# Patient Record
Sex: Female | Born: 1955
Health system: Southern US, Community
[De-identification: ages and names within clinical notes are randomized; demographics above are authoritative.]

## PROBLEM LIST (undated history)

## (undated) DIAGNOSIS — Z8742 Personal history of other diseases of the female genital tract: Secondary | ICD-10-CM

## (undated) DIAGNOSIS — B182 Chronic viral hepatitis C: Secondary | ICD-10-CM

## (undated) DIAGNOSIS — IMO0002 Reserved for concepts with insufficient information to code with codable children: Secondary | ICD-10-CM

## (undated) DIAGNOSIS — E1142 Type 2 diabetes mellitus with diabetic polyneuropathy: Secondary | ICD-10-CM

## (undated) DIAGNOSIS — Z8619 Personal history of other infectious and parasitic diseases: Secondary | ICD-10-CM

## (undated) DIAGNOSIS — E1165 Type 2 diabetes mellitus with hyperglycemia: Secondary | ICD-10-CM

## (undated) DIAGNOSIS — K740 Hepatic fibrosis: Secondary | ICD-10-CM

## (undated) DIAGNOSIS — G5603 Carpal tunnel syndrome, bilateral upper limbs: Secondary | ICD-10-CM

## (undated) DIAGNOSIS — E785 Hyperlipidemia, unspecified: Secondary | ICD-10-CM

## (undated) DIAGNOSIS — I10 Essential (primary) hypertension: Secondary | ICD-10-CM

## (undated) DIAGNOSIS — K219 Gastro-esophageal reflux disease without esophagitis: Secondary | ICD-10-CM

## (undated) DIAGNOSIS — N189 Chronic kidney disease, unspecified: Secondary | ICD-10-CM

## (undated) HISTORY — DX: Personal history of other diseases of the female genital tract: Z87.42

## (undated) HISTORY — DX: Chronic viral hepatitis C: B18.2

## (undated) HISTORY — DX: Gastro-esophageal reflux disease without esophagitis: K21.9

## (undated) HISTORY — DX: Carpal tunnel syndrome, bilateral upper limbs: G56.03

## (undated) HISTORY — DX: Personal history of other infectious and parasitic diseases: Z86.19

## (undated) HISTORY — DX: Type 2 diabetes mellitus with diabetic polyneuropathy: E11.42

## (undated) HISTORY — PX: EYE SURGERY: SHX253

## (undated) HISTORY — DX: Hepatic fibrosis: K74.0

## (undated) HISTORY — PX: ABDOMINAL HYSTERECTOMY: SHX81

## (undated) HISTORY — DX: Hyperlipidemia, unspecified: E78.5

## (undated) HISTORY — DX: Reserved for concepts with insufficient information to code with codable children: IMO0002

## (undated) HISTORY — PX: CARPAL TUNNEL RELEASE: SHX101

## (undated) HISTORY — PX: UPPER GI ENDOSCOPY: SHX6162

## (undated) HISTORY — PX: COLONOSCOPY: SHX174

## (undated) HISTORY — DX: Type 2 diabetes mellitus with hyperglycemia: E11.65

---

## 1998-06-03 ENCOUNTER — Emergency Department (HOSPITAL_COMMUNITY): Admission: EM | Admit: 1998-06-03 | Discharge: 1998-06-03 | Payer: Self-pay | Admitting: Emergency Medicine

## 2000-11-15 ENCOUNTER — Emergency Department (HOSPITAL_COMMUNITY): Admission: EM | Admit: 2000-11-15 | Discharge: 2000-11-15 | Payer: Self-pay | Admitting: Emergency Medicine

## 2002-07-13 ENCOUNTER — Emergency Department (HOSPITAL_COMMUNITY): Admission: EM | Admit: 2002-07-13 | Discharge: 2002-07-13 | Payer: Self-pay | Admitting: Emergency Medicine

## 2002-12-04 ENCOUNTER — Emergency Department (HOSPITAL_COMMUNITY): Admission: EM | Admit: 2002-12-04 | Discharge: 2002-12-04 | Payer: Self-pay | Admitting: Emergency Medicine

## 2002-12-07 DIAGNOSIS — E11319 Type 2 diabetes mellitus with unspecified diabetic retinopathy without macular edema: Secondary | ICD-10-CM | POA: Insufficient documentation

## 2003-10-27 ENCOUNTER — Observation Stay (HOSPITAL_COMMUNITY): Admission: AD | Admit: 2003-10-27 | Discharge: 2003-10-27 | Payer: Self-pay | Admitting: Family Medicine

## 2004-02-12 ENCOUNTER — Encounter: Admission: RE | Admit: 2004-02-12 | Discharge: 2004-02-12 | Payer: Self-pay | Admitting: Family Medicine

## 2004-06-24 ENCOUNTER — Ambulatory Visit (HOSPITAL_COMMUNITY): Admission: RE | Admit: 2004-06-24 | Discharge: 2004-06-24 | Payer: Self-pay | Admitting: Family Medicine

## 2004-08-12 ENCOUNTER — Ambulatory Visit (HOSPITAL_COMMUNITY): Admission: RE | Admit: 2004-08-12 | Discharge: 2004-08-12 | Payer: Self-pay | Admitting: Internal Medicine

## 2004-09-02 ENCOUNTER — Ambulatory Visit (HOSPITAL_COMMUNITY): Admission: RE | Admit: 2004-09-02 | Discharge: 2004-09-02 | Payer: Self-pay | Admitting: Internal Medicine

## 2004-10-27 ENCOUNTER — Ambulatory Visit: Payer: Self-pay | Admitting: *Deleted

## 2004-10-28 ENCOUNTER — Ambulatory Visit: Payer: Self-pay | Admitting: Family Medicine

## 2004-11-03 ENCOUNTER — Ambulatory Visit (HOSPITAL_COMMUNITY): Admission: RE | Admit: 2004-11-03 | Discharge: 2004-11-03 | Payer: Self-pay | Admitting: *Deleted

## 2004-11-18 ENCOUNTER — Ambulatory Visit: Payer: Self-pay | Admitting: Obstetrics and Gynecology

## 2004-12-08 ENCOUNTER — Inpatient Hospital Stay (HOSPITAL_COMMUNITY): Admission: AD | Admit: 2004-12-08 | Discharge: 2004-12-08 | Payer: Self-pay | Admitting: Obstetrics & Gynecology

## 2004-12-15 ENCOUNTER — Encounter (INDEPENDENT_AMBULATORY_CARE_PROVIDER_SITE_OTHER): Payer: Self-pay | Admitting: Specialist

## 2004-12-15 ENCOUNTER — Inpatient Hospital Stay (HOSPITAL_COMMUNITY): Admission: AD | Admit: 2004-12-15 | Discharge: 2004-12-15 | Payer: Self-pay | Admitting: Obstetrics & Gynecology

## 2004-12-15 ENCOUNTER — Ambulatory Visit (HOSPITAL_COMMUNITY): Admission: RE | Admit: 2004-12-15 | Discharge: 2004-12-15 | Payer: Self-pay | Admitting: Obstetrics and Gynecology

## 2004-12-15 ENCOUNTER — Ambulatory Visit: Payer: Self-pay | Admitting: Obstetrics and Gynecology

## 2004-12-30 ENCOUNTER — Ambulatory Visit: Payer: Self-pay | Admitting: Obstetrics & Gynecology

## 2005-01-03 ENCOUNTER — Ambulatory Visit: Payer: Self-pay | Admitting: Family Medicine

## 2005-01-03 ENCOUNTER — Inpatient Hospital Stay (HOSPITAL_COMMUNITY): Admission: AD | Admit: 2005-01-03 | Discharge: 2005-01-03 | Payer: Self-pay | Admitting: Family Medicine

## 2005-01-08 ENCOUNTER — Ambulatory Visit: Payer: Self-pay | Admitting: Family Medicine

## 2005-01-27 ENCOUNTER — Ambulatory Visit: Payer: Self-pay | Admitting: Obstetrics and Gynecology

## 2005-02-03 ENCOUNTER — Ambulatory Visit: Payer: Self-pay | Admitting: Obstetrics and Gynecology

## 2005-03-03 ENCOUNTER — Ambulatory Visit: Payer: Self-pay | Admitting: Nurse Practitioner

## 2005-03-17 ENCOUNTER — Ambulatory Visit: Payer: Self-pay | Admitting: Nurse Practitioner

## 2005-05-07 ENCOUNTER — Ambulatory Visit: Payer: Self-pay | Admitting: Obstetrics & Gynecology

## 2005-07-10 ENCOUNTER — Ambulatory Visit: Payer: Self-pay | Admitting: Internal Medicine

## 2005-07-16 ENCOUNTER — Ambulatory Visit: Payer: Self-pay | Admitting: Internal Medicine

## 2005-07-31 ENCOUNTER — Ambulatory Visit: Payer: Self-pay | Admitting: Internal Medicine

## 2005-09-04 ENCOUNTER — Ambulatory Visit: Payer: Self-pay | Admitting: Internal Medicine

## 2005-09-24 ENCOUNTER — Ambulatory Visit: Payer: Self-pay | Admitting: Internal Medicine

## 2005-10-19 ENCOUNTER — Ambulatory Visit: Payer: Self-pay | Admitting: Internal Medicine

## 2005-11-02 ENCOUNTER — Ambulatory Visit: Payer: Self-pay | Admitting: Internal Medicine

## 2005-11-04 ENCOUNTER — Ambulatory Visit: Payer: Self-pay | Admitting: Internal Medicine

## 2005-11-23 ENCOUNTER — Encounter: Admission: RE | Admit: 2005-11-23 | Discharge: 2005-11-23 | Payer: Self-pay | Admitting: *Deleted

## 2005-12-10 ENCOUNTER — Ambulatory Visit: Payer: Self-pay | Admitting: Obstetrics and Gynecology

## 2005-12-23 ENCOUNTER — Ambulatory Visit: Payer: Self-pay | Admitting: Internal Medicine

## 2006-02-01 ENCOUNTER — Ambulatory Visit: Payer: Self-pay | Admitting: Obstetrics and Gynecology

## 2006-02-01 ENCOUNTER — Inpatient Hospital Stay (HOSPITAL_COMMUNITY): Admission: RE | Admit: 2006-02-01 | Discharge: 2006-02-04 | Payer: Self-pay | Admitting: Obstetrics and Gynecology

## 2006-02-02 ENCOUNTER — Encounter (INDEPENDENT_AMBULATORY_CARE_PROVIDER_SITE_OTHER): Payer: Self-pay | Admitting: *Deleted

## 2006-02-18 ENCOUNTER — Ambulatory Visit: Payer: Self-pay | Admitting: Obstetrics and Gynecology

## 2006-03-11 ENCOUNTER — Ambulatory Visit: Payer: Self-pay | Admitting: Obstetrics and Gynecology

## 2006-03-17 ENCOUNTER — Ambulatory Visit: Payer: Self-pay | Admitting: Internal Medicine

## 2006-04-12 ENCOUNTER — Ambulatory Visit: Payer: Self-pay | Admitting: Internal Medicine

## 2006-04-12 ENCOUNTER — Ambulatory Visit (HOSPITAL_COMMUNITY): Admission: RE | Admit: 2006-04-12 | Discharge: 2006-04-12 | Payer: Self-pay | Admitting: Internal Medicine

## 2006-04-25 ENCOUNTER — Emergency Department (HOSPITAL_COMMUNITY): Admission: EM | Admit: 2006-04-25 | Discharge: 2006-04-25 | Payer: Self-pay | Admitting: Emergency Medicine

## 2006-05-04 ENCOUNTER — Ambulatory Visit: Payer: Self-pay | Admitting: Internal Medicine

## 2006-06-29 ENCOUNTER — Ambulatory Visit: Payer: Self-pay | Admitting: Internal Medicine

## 2006-08-02 ENCOUNTER — Ambulatory Visit (HOSPITAL_COMMUNITY): Admission: RE | Admit: 2006-08-02 | Discharge: 2006-08-02 | Payer: Self-pay | Admitting: Hospitalist

## 2006-08-02 ENCOUNTER — Ambulatory Visit: Payer: Self-pay | Admitting: Hospitalist

## 2006-08-05 ENCOUNTER — Ambulatory Visit: Payer: Self-pay | Admitting: Hospitalist

## 2006-08-10 ENCOUNTER — Ambulatory Visit: Payer: Self-pay | Admitting: Cardiology

## 2006-08-11 ENCOUNTER — Encounter (HOSPITAL_COMMUNITY): Admission: RE | Admit: 2006-08-11 | Discharge: 2006-08-19 | Payer: Self-pay | Admitting: Internal Medicine

## 2006-08-16 ENCOUNTER — Ambulatory Visit: Payer: Self-pay | Admitting: Internal Medicine

## 2006-08-17 ENCOUNTER — Ambulatory Visit: Payer: Self-pay | Admitting: Internal Medicine

## 2006-08-24 ENCOUNTER — Ambulatory Visit (HOSPITAL_COMMUNITY): Admission: RE | Admit: 2006-08-24 | Discharge: 2006-08-24 | Payer: Self-pay | Admitting: Internal Medicine

## 2006-08-24 ENCOUNTER — Encounter: Payer: Self-pay | Admitting: Vascular Surgery

## 2006-09-27 ENCOUNTER — Ambulatory Visit: Payer: Self-pay | Admitting: Internal Medicine

## 2006-10-07 ENCOUNTER — Encounter (INDEPENDENT_AMBULATORY_CARE_PROVIDER_SITE_OTHER): Payer: Self-pay | Admitting: *Deleted

## 2006-10-07 ENCOUNTER — Ambulatory Visit: Payer: Self-pay | Admitting: Internal Medicine

## 2006-10-07 DIAGNOSIS — Z8619 Personal history of other infectious and parasitic diseases: Secondary | ICD-10-CM

## 2006-10-07 HISTORY — DX: Personal history of other infectious and parasitic diseases: Z86.19

## 2006-10-07 LAB — CONVERTED CEMR LAB: D-Dimer, Quant: <0.22 ug/mL (ref 0.00–0.48)

## 2006-11-02 ENCOUNTER — Ambulatory Visit: Payer: Self-pay | Admitting: Internal Medicine

## 2006-11-02 ENCOUNTER — Encounter (INDEPENDENT_AMBULATORY_CARE_PROVIDER_SITE_OTHER): Payer: Self-pay | Admitting: *Deleted

## 2006-11-02 LAB — CONVERTED CEMR LAB
AST: 24 units/L (ref 0–37)
Albumin: 3.4 g/dL — ABNORMAL LOW (ref 3.5–5.2)
Alkaline Phosphatase: 56 units/L (ref 39–117)
Potassium: 3.5 meq/L (ref 3.5–5.3)
Sodium: 135 meq/L (ref 135–145)
Total Protein: 7 g/dL (ref 6.0–8.3)

## 2006-11-09 ENCOUNTER — Ambulatory Visit: Payer: Self-pay | Admitting: *Deleted

## 2006-11-09 ENCOUNTER — Encounter (INDEPENDENT_AMBULATORY_CARE_PROVIDER_SITE_OTHER): Payer: Self-pay | Admitting: *Deleted

## 2006-11-09 DIAGNOSIS — I152 Hypertension secondary to endocrine disorders: Secondary | ICD-10-CM | POA: Insufficient documentation

## 2006-11-09 DIAGNOSIS — I10 Essential (primary) hypertension: Secondary | ICD-10-CM

## 2006-11-09 DIAGNOSIS — E785 Hyperlipidemia, unspecified: Secondary | ICD-10-CM

## 2006-11-09 DIAGNOSIS — E1159 Type 2 diabetes mellitus with other circulatory complications: Secondary | ICD-10-CM

## 2006-11-09 LAB — CONVERTED CEMR LAB
CO2: 28 meq/L (ref 19–32)
Calcium: 9.5 mg/dL (ref 8.4–10.5)
Creatinine, Ser: 1.09 mg/dL (ref 0.40–1.20)
Glucose, Bld: 406 mg/dL — ABNORMAL HIGH (ref 70–99)
Sodium: 136 meq/L (ref 135–145)

## 2006-11-24 ENCOUNTER — Encounter: Admission: RE | Admit: 2006-11-24 | Discharge: 2006-11-24 | Payer: Self-pay | Admitting: Internal Medicine

## 2006-12-14 ENCOUNTER — Ambulatory Visit: Payer: Self-pay | Admitting: Internal Medicine

## 2007-01-31 ENCOUNTER — Encounter (INDEPENDENT_AMBULATORY_CARE_PROVIDER_SITE_OTHER): Payer: Self-pay | Admitting: *Deleted

## 2007-01-31 ENCOUNTER — Ambulatory Visit: Payer: Self-pay | Admitting: Hospitalist

## 2007-01-31 LAB — CONVERTED CEMR LAB
BUN: 14 mg/dL (ref 6–23)
CO2: 31 meq/L (ref 19–32)
Glucose, Bld: 385 mg/dL — ABNORMAL HIGH (ref 70–99)
Potassium: 3.6 meq/L (ref 3.5–5.3)

## 2007-02-08 ENCOUNTER — Telehealth (INDEPENDENT_AMBULATORY_CARE_PROVIDER_SITE_OTHER): Payer: Self-pay | Admitting: *Deleted

## 2007-02-22 ENCOUNTER — Telehealth: Payer: Self-pay | Admitting: *Deleted

## 2007-02-24 ENCOUNTER — Ambulatory Visit: Payer: Self-pay | Admitting: Internal Medicine

## 2007-02-24 ENCOUNTER — Encounter (INDEPENDENT_AMBULATORY_CARE_PROVIDER_SITE_OTHER): Payer: Self-pay | Admitting: *Deleted

## 2007-02-24 LAB — CONVERTED CEMR LAB: Blood Glucose, Fingerstick: 89

## 2007-03-14 ENCOUNTER — Telehealth (INDEPENDENT_AMBULATORY_CARE_PROVIDER_SITE_OTHER): Payer: Self-pay | Admitting: *Deleted

## 2007-03-15 ENCOUNTER — Telehealth: Payer: Self-pay | Admitting: *Deleted

## 2007-03-15 ENCOUNTER — Ambulatory Visit: Payer: Self-pay | Admitting: Internal Medicine

## 2007-03-16 ENCOUNTER — Telehealth (INDEPENDENT_AMBULATORY_CARE_PROVIDER_SITE_OTHER): Payer: Self-pay | Admitting: *Deleted

## 2007-03-22 ENCOUNTER — Encounter (INDEPENDENT_AMBULATORY_CARE_PROVIDER_SITE_OTHER): Payer: Self-pay | Admitting: *Deleted

## 2007-03-30 ENCOUNTER — Telehealth (INDEPENDENT_AMBULATORY_CARE_PROVIDER_SITE_OTHER): Payer: Self-pay | Admitting: *Deleted

## 2007-04-25 ENCOUNTER — Encounter (INDEPENDENT_AMBULATORY_CARE_PROVIDER_SITE_OTHER): Payer: Self-pay | Admitting: *Deleted

## 2007-04-25 ENCOUNTER — Ambulatory Visit: Payer: Self-pay | Admitting: Hospitalist

## 2007-04-25 LAB — CONVERTED CEMR LAB
ALT: 29 units/L (ref 0–35)
Albumin: 4.5 g/dL (ref 3.5–5.2)
CO2: 26 meq/L (ref 19–32)
Calcium: 9.9 mg/dL (ref 8.4–10.5)
Chloride: 99 meq/L (ref 96–112)
Creatinine, Ser: 0.9 mg/dL (ref 0.40–1.20)
Microalb Creat Ratio: 6.8 mg/g (ref 0.0–30.0)
Potassium: 3.6 meq/L (ref 3.5–5.3)
Total Protein: 7.9 g/dL (ref 6.0–8.3)

## 2007-05-04 ENCOUNTER — Telehealth: Payer: Self-pay | Admitting: *Deleted

## 2007-07-18 DIAGNOSIS — T677XXA Heat edema, initial encounter: Secondary | ICD-10-CM

## 2007-07-19 ENCOUNTER — Ambulatory Visit: Payer: Self-pay | Admitting: Infectious Disease

## 2007-07-19 ENCOUNTER — Telehealth: Payer: Self-pay | Admitting: Licensed Clinical Social Worker

## 2007-07-19 LAB — CONVERTED CEMR LAB: Blood Glucose, Fingerstick: 494

## 2007-08-12 ENCOUNTER — Encounter (INDEPENDENT_AMBULATORY_CARE_PROVIDER_SITE_OTHER): Payer: Self-pay | Admitting: *Deleted

## 2007-08-15 ENCOUNTER — Telehealth: Payer: Self-pay | Admitting: *Deleted

## 2007-08-18 ENCOUNTER — Ambulatory Visit: Payer: Self-pay | Admitting: Internal Medicine

## 2007-08-18 DIAGNOSIS — M25539 Pain in unspecified wrist: Secondary | ICD-10-CM

## 2007-10-12 ENCOUNTER — Ambulatory Visit: Payer: Self-pay | Admitting: Internal Medicine

## 2007-11-15 ENCOUNTER — Ambulatory Visit: Payer: Self-pay | Admitting: Internal Medicine

## 2007-11-15 ENCOUNTER — Ambulatory Visit (HOSPITAL_COMMUNITY): Admission: RE | Admit: 2007-11-15 | Discharge: 2007-11-15 | Payer: Self-pay | Admitting: Internal Medicine

## 2007-11-15 ENCOUNTER — Encounter (INDEPENDENT_AMBULATORY_CARE_PROVIDER_SITE_OTHER): Payer: Self-pay | Admitting: Infectious Diseases

## 2007-11-15 DIAGNOSIS — M25549 Pain in joints of unspecified hand: Secondary | ICD-10-CM

## 2007-11-15 LAB — CONVERTED CEMR LAB: Blood Glucose, Fingerstick: 361

## 2007-11-21 LAB — CONVERTED CEMR LAB
HCT: 41.4 % (ref 36.0–46.0)
Hemoglobin: 14.4 g/dL (ref 12.0–15.0)
MCHC: 34.8 g/dL (ref 30.0–36.0)
MCV: 80.5 fL (ref 78.0–100.0)
Platelets: 246 10*3/uL (ref 150–400)
RDW: 13.3 % (ref 11.5–15.5)

## 2007-11-28 ENCOUNTER — Ambulatory Visit: Payer: Self-pay | Admitting: Hospitalist

## 2007-11-28 ENCOUNTER — Encounter: Admission: RE | Admit: 2007-11-28 | Discharge: 2007-11-28 | Payer: Self-pay | Admitting: *Deleted

## 2007-11-28 LAB — CONVERTED CEMR LAB
Blood Glucose, Home Monitor: 1 mg/dL
Insulin/Carbohydrate Ratio: 1

## 2007-11-29 LAB — CONVERTED CEMR LAB
Calcium: 9.8 mg/dL (ref 8.4–10.5)
Creatinine, Ser: 0.85 mg/dL (ref 0.40–1.20)
Sodium: 134 meq/L — ABNORMAL LOW (ref 135–145)

## 2007-12-05 ENCOUNTER — Telehealth: Payer: Self-pay | Admitting: *Deleted

## 2007-12-29 ENCOUNTER — Telehealth (INDEPENDENT_AMBULATORY_CARE_PROVIDER_SITE_OTHER): Payer: Self-pay | Admitting: *Deleted

## 2008-02-07 ENCOUNTER — Telehealth (INDEPENDENT_AMBULATORY_CARE_PROVIDER_SITE_OTHER): Payer: Self-pay | Admitting: Pharmacy Technician

## 2008-02-14 ENCOUNTER — Telehealth (INDEPENDENT_AMBULATORY_CARE_PROVIDER_SITE_OTHER): Payer: Self-pay | Admitting: *Deleted

## 2008-02-20 ENCOUNTER — Telehealth (INDEPENDENT_AMBULATORY_CARE_PROVIDER_SITE_OTHER): Payer: Self-pay | Admitting: *Deleted

## 2008-02-21 ENCOUNTER — Ambulatory Visit: Payer: Self-pay | Admitting: Infectious Diseases

## 2008-02-21 DIAGNOSIS — M25519 Pain in unspecified shoulder: Secondary | ICD-10-CM

## 2008-02-21 LAB — CONVERTED CEMR LAB: Hgb A1c MFr Bld: >14 %

## 2008-03-06 ENCOUNTER — Telehealth (INDEPENDENT_AMBULATORY_CARE_PROVIDER_SITE_OTHER): Payer: Self-pay | Admitting: *Deleted

## 2008-03-20 ENCOUNTER — Ambulatory Visit: Payer: Self-pay | Admitting: Infectious Disease

## 2008-03-20 ENCOUNTER — Ambulatory Visit (HOSPITAL_COMMUNITY): Admission: RE | Admit: 2008-03-20 | Discharge: 2008-03-20 | Payer: Self-pay | Admitting: Infectious Disease

## 2008-03-20 LAB — CONVERTED CEMR LAB: Blood Glucose, Fingerstick: 71

## 2008-03-22 ENCOUNTER — Telehealth: Payer: Self-pay | Admitting: *Deleted

## 2008-04-04 ENCOUNTER — Ambulatory Visit: Payer: Self-pay | Admitting: Internal Medicine

## 2008-04-05 ENCOUNTER — Encounter (INDEPENDENT_AMBULATORY_CARE_PROVIDER_SITE_OTHER): Payer: Self-pay | Admitting: *Deleted

## 2008-04-05 ENCOUNTER — Ambulatory Visit: Payer: Self-pay | Admitting: Internal Medicine

## 2008-04-06 ENCOUNTER — Telehealth (INDEPENDENT_AMBULATORY_CARE_PROVIDER_SITE_OTHER): Payer: Self-pay | Admitting: *Deleted

## 2008-04-06 LAB — CONVERTED CEMR LAB: Sed Rate: 14 mm/hr (ref 0–22)

## 2008-04-11 ENCOUNTER — Ambulatory Visit: Payer: Self-pay | Admitting: Internal Medicine

## 2008-06-01 ENCOUNTER — Telehealth (INDEPENDENT_AMBULATORY_CARE_PROVIDER_SITE_OTHER): Payer: Self-pay | Admitting: *Deleted

## 2008-06-06 ENCOUNTER — Telehealth: Payer: Self-pay | Admitting: *Deleted

## 2008-06-12 ENCOUNTER — Ambulatory Visit: Payer: Self-pay | Admitting: *Deleted

## 2008-06-13 ENCOUNTER — Telehealth: Payer: Self-pay | Admitting: *Deleted

## 2008-06-15 ENCOUNTER — Encounter: Payer: Self-pay | Admitting: Internal Medicine

## 2008-06-15 ENCOUNTER — Ambulatory Visit: Payer: Self-pay | Admitting: Infectious Diseases

## 2008-06-20 LAB — CONVERTED CEMR LAB
BUN: 18 mg/dL (ref 6–23)
Chloride: 103 meq/L (ref 96–112)
Creatinine, Ser: 0.76 mg/dL (ref 0.40–1.20)
Glucose, Bld: 162 mg/dL — ABNORMAL HIGH (ref 70–99)
Potassium: 3.9 meq/L (ref 3.5–5.3)

## 2008-06-27 ENCOUNTER — Ambulatory Visit: Payer: Self-pay | Admitting: Internal Medicine

## 2008-06-27 LAB — CONVERTED CEMR LAB
Blood Glucose, Fingerstick: 203
Hgb A1c MFr Bld: 6.3 %

## 2008-07-17 ENCOUNTER — Ambulatory Visit: Payer: Self-pay | Admitting: Internal Medicine

## 2008-07-23 ENCOUNTER — Telehealth: Payer: Self-pay | Admitting: Internal Medicine

## 2008-07-26 ENCOUNTER — Encounter: Admission: RE | Admit: 2008-07-26 | Discharge: 2008-09-20 | Payer: Self-pay | Admitting: Internal Medicine

## 2008-07-30 ENCOUNTER — Encounter: Payer: Self-pay | Admitting: Internal Medicine

## 2008-08-06 ENCOUNTER — Telehealth: Payer: Self-pay | Admitting: Internal Medicine

## 2008-08-31 ENCOUNTER — Ambulatory Visit: Payer: Self-pay | Admitting: Internal Medicine

## 2008-08-31 ENCOUNTER — Encounter: Payer: Self-pay | Admitting: Internal Medicine

## 2008-09-01 ENCOUNTER — Encounter: Payer: Self-pay | Admitting: Internal Medicine

## 2008-09-01 LAB — CONVERTED CEMR LAB
AST: 19 units/L (ref 0–37)
Albumin: 4.1 g/dL (ref 3.5–5.2)
BUN: 18 mg/dL (ref 6–23)
Calcium: 9.7 mg/dL (ref 8.4–10.5)
Chloride: 104 meq/L (ref 96–112)
Creatinine, Ser: 1.05 mg/dL (ref 0.40–1.20)
Glucose, Bld: 146 mg/dL — ABNORMAL HIGH (ref 70–99)
Potassium: 3.8 meq/L (ref 3.5–5.3)

## 2008-09-06 ENCOUNTER — Encounter: Payer: Self-pay | Admitting: Internal Medicine

## 2008-09-07 ENCOUNTER — Telehealth: Payer: Self-pay | Admitting: Internal Medicine

## 2008-09-25 ENCOUNTER — Telehealth: Payer: Self-pay | Admitting: Internal Medicine

## 2008-10-11 ENCOUNTER — Ambulatory Visit: Payer: Self-pay | Admitting: Internal Medicine

## 2008-10-11 ENCOUNTER — Encounter (INDEPENDENT_AMBULATORY_CARE_PROVIDER_SITE_OTHER): Payer: Self-pay | Admitting: Internal Medicine

## 2008-10-11 LAB — CONVERTED CEMR LAB: Blood Glucose, Fingerstick: 122

## 2008-10-15 LAB — CONVERTED CEMR LAB
ALT: 28 units/L (ref 0–35)
Albumin: 4.3 g/dL (ref 3.5–5.2)
CO2: 27 meq/L (ref 19–32)
Calcium: 10 mg/dL (ref 8.4–10.5)
Chloride: 98 meq/L (ref 96–112)
Cholesterol: 110 mg/dL (ref 0–200)
Glucose, Bld: 117 mg/dL — ABNORMAL HIGH (ref 70–99)
Platelets: 266 10*3/uL (ref 150–400)
Potassium: 3.9 meq/L (ref 3.5–5.3)
RBC: 4.8 M/uL (ref 3.87–5.11)
Sodium: 139 meq/L (ref 135–145)
Total Protein: 7.8 g/dL (ref 6.0–8.3)
Triglycerides: 103 mg/dL (ref <150)
VLDL: 21 mg/dL (ref 0–40)
WBC: 9.4 10*3/uL (ref 4.0–10.5)

## 2008-10-16 ENCOUNTER — Ambulatory Visit: Payer: Self-pay | Admitting: Internal Medicine

## 2008-10-16 LAB — CONVERTED CEMR LAB
OCCULT 1: NEGATIVE
OCCULT 2: POSITIVE
OCCULT 3: POSITIVE

## 2008-10-17 ENCOUNTER — Telehealth: Payer: Self-pay | Admitting: *Deleted

## 2008-10-17 ENCOUNTER — Encounter (INDEPENDENT_AMBULATORY_CARE_PROVIDER_SITE_OTHER): Payer: Self-pay | Admitting: Internal Medicine

## 2008-10-17 DIAGNOSIS — K921 Melena: Secondary | ICD-10-CM

## 2008-10-22 ENCOUNTER — Ambulatory Visit: Payer: Self-pay | Admitting: Internal Medicine

## 2008-10-26 ENCOUNTER — Encounter: Payer: Self-pay | Admitting: Internal Medicine

## 2008-11-12 ENCOUNTER — Ambulatory Visit: Payer: Self-pay | Admitting: Gastroenterology

## 2008-11-12 DIAGNOSIS — R195 Other fecal abnormalities: Secondary | ICD-10-CM | POA: Insufficient documentation

## 2008-11-16 ENCOUNTER — Ambulatory Visit: Payer: Self-pay | Admitting: Internal Medicine

## 2008-11-16 LAB — CONVERTED CEMR LAB: Blood Glucose, Fingerstick: 148

## 2008-11-28 ENCOUNTER — Encounter: Admission: RE | Admit: 2008-11-28 | Discharge: 2008-11-28 | Payer: Self-pay | Admitting: Internal Medicine

## 2008-12-05 ENCOUNTER — Ambulatory Visit: Payer: Self-pay | Admitting: Gastroenterology

## 2008-12-11 ENCOUNTER — Telehealth: Payer: Self-pay | Admitting: Gastroenterology

## 2008-12-14 ENCOUNTER — Telehealth (INDEPENDENT_AMBULATORY_CARE_PROVIDER_SITE_OTHER): Payer: Self-pay | Admitting: *Deleted

## 2008-12-18 ENCOUNTER — Encounter: Payer: Self-pay | Admitting: Gastroenterology

## 2008-12-18 ENCOUNTER — Ambulatory Visit: Payer: Self-pay | Admitting: Gastroenterology

## 2008-12-18 LAB — HM COLONOSCOPY

## 2008-12-19 ENCOUNTER — Telehealth: Payer: Self-pay | Admitting: Gastroenterology

## 2008-12-20 ENCOUNTER — Encounter: Payer: Self-pay | Admitting: Gastroenterology

## 2008-12-27 ENCOUNTER — Ambulatory Visit: Payer: Self-pay | Admitting: Infectious Disease

## 2008-12-28 ENCOUNTER — Telehealth: Payer: Self-pay | Admitting: Internal Medicine

## 2009-03-18 ENCOUNTER — Encounter: Payer: Self-pay | Admitting: Internal Medicine

## 2009-03-21 ENCOUNTER — Encounter: Payer: Self-pay | Admitting: Internal Medicine

## 2009-03-21 ENCOUNTER — Ambulatory Visit: Payer: Self-pay | Admitting: Internal Medicine

## 2009-03-21 LAB — CONVERTED CEMR LAB
Albumin: 4.1 g/dL (ref 3.5–5.2)
BUN: 17 mg/dL (ref 6–23)
CO2: 26 meq/L (ref 19–32)
GFR calc Af Amer: >60 mL/min (ref >60)
GFR calc non Af Amer: 51 mL/min — ABNORMAL LOW (ref >60)
Glucose, Bld: 474 mg/dL — ABNORMAL HIGH (ref 70–99)
Microalb Creat Ratio: 11 mg/g (ref 0.0–30.0)
Microalb, Ur: 0.5 mg/dL (ref 0.00–1.89)
Sodium: 132 meq/L — ABNORMAL LOW (ref 135–145)
Total Bilirubin: 0.4 mg/dL (ref 0.3–1.2)
Total CHOL/HDL Ratio: 3.3
Total Protein: 7.7 g/dL (ref 6.0–8.3)
VLDL: 32 mg/dL (ref 0–40)

## 2009-03-27 ENCOUNTER — Telehealth: Payer: Self-pay | Admitting: *Deleted

## 2009-04-19 ENCOUNTER — Encounter: Payer: Self-pay | Admitting: Internal Medicine

## 2009-04-26 ENCOUNTER — Ambulatory Visit: Payer: Self-pay | Admitting: Internal Medicine

## 2009-04-26 LAB — CONVERTED CEMR LAB: Blood Glucose, Fingerstick: 230

## 2009-05-01 ENCOUNTER — Encounter: Payer: Self-pay | Admitting: Internal Medicine

## 2009-05-01 LAB — HM DIABETES EYE EXAM

## 2009-05-15 ENCOUNTER — Encounter: Payer: Self-pay | Admitting: Internal Medicine

## 2009-05-24 ENCOUNTER — Ambulatory Visit: Payer: Self-pay | Admitting: Internal Medicine

## 2009-06-13 ENCOUNTER — Encounter: Payer: Self-pay | Admitting: Internal Medicine

## 2009-06-13 ENCOUNTER — Ambulatory Visit: Payer: Self-pay | Admitting: Infectious Diseases

## 2009-06-13 LAB — CONVERTED CEMR LAB
AST: 25 units/L (ref 0–37)
Alkaline Phosphatase: 65 units/L (ref 39–117)
BUN: 17 mg/dL (ref 6–23)
Blood Glucose, Fingerstick: 189
Creatinine, Ser: 0.88 mg/dL (ref 0.40–1.20)
Hgb A1c MFr Bld: 9.7 %
Microalb Creat Ratio: 15.8 mg/g (ref 0.0–30.0)

## 2009-07-10 ENCOUNTER — Ambulatory Visit: Payer: Self-pay | Admitting: Internal Medicine

## 2009-07-10 LAB — CONVERTED CEMR LAB
ALT: 24 units/L (ref 0–35)
AST: 20 units/L (ref 0–37)
Albumin: 4.2 g/dL (ref 3.5–5.2)
Blood Glucose, Fingerstick: 138
Calcium: 9.5 mg/dL (ref 8.4–10.5)
Chloride: 99 meq/L (ref 96–112)
Potassium: 3.8 meq/L (ref 3.5–5.3)
Sodium: 137 meq/L (ref 135–145)
Total Protein: 7.6 g/dL (ref 6.0–8.3)

## 2009-09-09 ENCOUNTER — Ambulatory Visit: Payer: Self-pay | Admitting: Internal Medicine

## 2009-09-09 ENCOUNTER — Encounter: Payer: Self-pay | Admitting: Internal Medicine

## 2009-09-09 ENCOUNTER — Encounter (INDEPENDENT_AMBULATORY_CARE_PROVIDER_SITE_OTHER): Payer: Self-pay | Admitting: Internal Medicine

## 2009-09-09 ENCOUNTER — Inpatient Hospital Stay (HOSPITAL_COMMUNITY): Admission: AD | Admit: 2009-09-09 | Discharge: 2009-09-10 | Payer: Self-pay | Admitting: Internal Medicine

## 2009-09-09 DIAGNOSIS — R079 Chest pain, unspecified: Secondary | ICD-10-CM

## 2009-09-09 DIAGNOSIS — A048 Other specified bacterial intestinal infections: Secondary | ICD-10-CM | POA: Insufficient documentation

## 2009-09-10 ENCOUNTER — Encounter (INDEPENDENT_AMBULATORY_CARE_PROVIDER_SITE_OTHER): Payer: Self-pay | Admitting: Internal Medicine

## 2009-09-30 ENCOUNTER — Ambulatory Visit: Payer: Self-pay | Admitting: Internal Medicine

## 2009-09-30 LAB — CONVERTED CEMR LAB
CO2: 27 meq/L (ref 19–32)
Calcium: 9.7 mg/dL (ref 8.4–10.5)
Creatinine, Ser: 0.85 mg/dL (ref 0.40–1.20)
Glucose, Bld: 279 mg/dL — ABNORMAL HIGH (ref 70–99)
Sodium: 137 meq/L (ref 135–145)

## 2009-11-05 ENCOUNTER — Ambulatory Visit: Payer: Self-pay | Admitting: Cardiology

## 2009-11-12 ENCOUNTER — Telehealth (INDEPENDENT_AMBULATORY_CARE_PROVIDER_SITE_OTHER): Payer: Self-pay | Admitting: *Deleted

## 2009-11-13 ENCOUNTER — Encounter (HOSPITAL_COMMUNITY): Admission: RE | Admit: 2009-11-13 | Discharge: 2009-12-04 | Payer: Self-pay | Admitting: Cardiovascular Disease

## 2009-11-13 ENCOUNTER — Ambulatory Visit: Payer: Self-pay

## 2009-11-13 ENCOUNTER — Ambulatory Visit: Payer: Self-pay | Admitting: Cardiology

## 2009-11-20 ENCOUNTER — Ambulatory Visit: Payer: Self-pay | Admitting: Cardiology

## 2009-11-22 LAB — CONVERTED CEMR LAB
AST: 29 units/L (ref 0–37)
Albumin: 3.8 g/dL (ref 3.5–5.2)
Cholesterol: 121 mg/dL (ref 0–200)
VLDL: 21.2 mg/dL (ref 0.0–40.0)

## 2009-12-10 ENCOUNTER — Encounter: Admission: RE | Admit: 2009-12-10 | Discharge: 2009-12-10 | Payer: Self-pay | Admitting: Internal Medicine

## 2009-12-30 ENCOUNTER — Ambulatory Visit: Payer: Self-pay | Admitting: Internal Medicine

## 2009-12-30 LAB — CONVERTED CEMR LAB
Blood Glucose, Fingerstick: 292
CO2: 26 meq/L (ref 19–32)
Chloride: 100 meq/L (ref 96–112)
Creatinine, Ser: 0.73 mg/dL (ref 0.40–1.20)
Sodium: 138 meq/L (ref 135–145)

## 2010-01-06 ENCOUNTER — Ambulatory Visit: Payer: Self-pay | Admitting: Cardiology

## 2010-01-07 ENCOUNTER — Ambulatory Visit: Payer: Self-pay | Admitting: Internal Medicine

## 2010-01-07 DIAGNOSIS — M79609 Pain in unspecified limb: Secondary | ICD-10-CM | POA: Insufficient documentation

## 2010-01-07 DIAGNOSIS — H5789 Other specified disorders of eye and adnexa: Secondary | ICD-10-CM | POA: Insufficient documentation

## 2010-01-07 LAB — CONVERTED CEMR LAB
CRP: 0.1 mg/dL (ref <0.6)
Cyclic Citrullin Peptide Ab: 1.4 units (ref <7)
Rhuematoid fact SerPl-aCnc: 29 intl units/mL — ABNORMAL HIGH (ref 0–20)
Sed Rate: 8 mm/hr (ref 0–22)

## 2010-04-09 ENCOUNTER — Telehealth: Payer: Self-pay | Admitting: Internal Medicine

## 2010-05-20 ENCOUNTER — Ambulatory Visit: Payer: Self-pay | Admitting: Internal Medicine

## 2010-06-11 ENCOUNTER — Ambulatory Visit: Payer: Self-pay | Admitting: Internal Medicine

## 2010-06-11 ENCOUNTER — Ambulatory Visit (HOSPITAL_COMMUNITY): Admission: RE | Admit: 2010-06-11 | Discharge: 2010-06-11 | Payer: Self-pay | Admitting: Internal Medicine

## 2010-06-11 ENCOUNTER — Encounter: Payer: Self-pay | Admitting: Internal Medicine

## 2010-06-11 LAB — CONVERTED CEMR LAB: Hgb A1c MFr Bld: 8.8 %

## 2010-06-12 ENCOUNTER — Ambulatory Visit: Payer: Self-pay | Admitting: Cardiology

## 2010-06-19 ENCOUNTER — Encounter: Payer: Self-pay | Admitting: Internal Medicine

## 2010-06-19 ENCOUNTER — Telehealth: Payer: Self-pay | Admitting: Internal Medicine

## 2010-06-19 ENCOUNTER — Ambulatory Visit: Payer: Self-pay | Admitting: Internal Medicine

## 2010-06-19 LAB — CONVERTED CEMR LAB
CO2: 27 meq/L (ref 19–32)
Creatinine, Urine: 68.8 mg/dL
Glucose, Bld: 199 mg/dL — ABNORMAL HIGH (ref 70–99)
HDL: 38 mg/dL — ABNORMAL LOW (ref >39)
Microalb Creat Ratio: 299.1 mg/g — ABNORMAL HIGH (ref 0.0–30.0)
Microalb, Ur: 20.58 mg/dL — ABNORMAL HIGH (ref 0.00–1.89)
Potassium: 3.9 meq/L (ref 3.5–5.3)
RBC: 4.77 M/uL (ref 3.87–5.11)
Sodium: 136 meq/L (ref 135–145)
Total CHOL/HDL Ratio: 2.7
VLDL: 15 mg/dL (ref 0–40)
WBC: 7.2 10*3/uL (ref 4.0–10.5)

## 2010-06-24 ENCOUNTER — Telehealth: Payer: Self-pay | Admitting: Internal Medicine

## 2010-06-26 ENCOUNTER — Ambulatory Visit: Payer: Self-pay

## 2010-06-26 ENCOUNTER — Ambulatory Visit (HOSPITAL_COMMUNITY): Admission: RE | Admit: 2010-06-26 | Discharge: 2010-06-26 | Payer: Self-pay | Admitting: Cardiology

## 2010-06-26 ENCOUNTER — Ambulatory Visit: Payer: Self-pay | Admitting: Cardiology

## 2010-06-26 ENCOUNTER — Encounter: Payer: Self-pay | Admitting: Cardiology

## 2010-08-08 ENCOUNTER — Telehealth: Payer: Self-pay | Admitting: Internal Medicine

## 2010-08-18 ENCOUNTER — Telehealth (INDEPENDENT_AMBULATORY_CARE_PROVIDER_SITE_OTHER): Payer: Self-pay | Admitting: *Deleted

## 2010-08-18 ENCOUNTER — Telehealth: Payer: Self-pay | Admitting: Internal Medicine

## 2010-08-21 ENCOUNTER — Ambulatory Visit: Payer: Self-pay | Admitting: Internal Medicine

## 2010-08-21 DIAGNOSIS — G609 Hereditary and idiopathic neuropathy, unspecified: Secondary | ICD-10-CM | POA: Insufficient documentation

## 2010-08-21 LAB — CONVERTED CEMR LAB: Blood Glucose, Fingerstick: 332

## 2010-08-21 LAB — HM DIABETES FOOT EXAM

## 2010-08-26 LAB — CONVERTED CEMR LAB
Anti Nuclear Antibody(ANA): NEGATIVE
TSH: 1.125 microintl units/mL (ref 0.350–4.5)
Vitamin B-12: 563 pg/mL (ref 211–911)

## 2010-08-27 ENCOUNTER — Encounter: Payer: Self-pay | Admitting: Internal Medicine

## 2010-08-27 ENCOUNTER — Ambulatory Visit: Payer: Self-pay | Admitting: Internal Medicine

## 2010-08-29 LAB — CONVERTED CEMR LAB

## 2010-09-04 ENCOUNTER — Ambulatory Visit: Payer: Self-pay | Admitting: Internal Medicine

## 2010-09-04 DIAGNOSIS — E1149 Type 2 diabetes mellitus with other diabetic neurological complication: Secondary | ICD-10-CM

## 2010-09-09 ENCOUNTER — Telehealth: Payer: Self-pay | Admitting: *Deleted

## 2010-09-25 ENCOUNTER — Telehealth: Payer: Self-pay | Admitting: Internal Medicine

## 2010-09-26 ENCOUNTER — Ambulatory Visit: Payer: Self-pay | Admitting: Family Medicine

## 2010-10-07 ENCOUNTER — Ambulatory Visit: Payer: Self-pay | Admitting: Internal Medicine

## 2010-10-17 ENCOUNTER — Telehealth: Payer: Self-pay | Admitting: Internal Medicine

## 2010-11-03 ENCOUNTER — Ambulatory Visit: Payer: Self-pay | Admitting: Family Medicine

## 2010-12-19 ENCOUNTER — Encounter
Admission: RE | Admit: 2010-12-19 | Discharge: 2010-12-19 | Payer: Self-pay | Source: Home / Self Care | Attending: Internal Medicine | Admitting: Internal Medicine

## 2010-12-19 LAB — HM MAMMOGRAPHY: HM Mammogram: NEGATIVE

## 2010-12-28 ENCOUNTER — Encounter: Payer: Self-pay | Admitting: Internal Medicine

## 2010-12-28 ENCOUNTER — Encounter: Payer: Self-pay | Admitting: *Deleted

## 2010-12-29 ENCOUNTER — Telehealth: Payer: Self-pay | Admitting: Internal Medicine

## 2011-01-06 NOTE — Assessment & Plan Note (Signed)
Summary: EST-CK/FU/MEDS/CFB   Vital Signs:  Patient profile:   55 year old female Height:      57 inches (144.78 cm) Weight:      175.1 pounds (79.59 kg) BMI:     38.03 Temp:     97.0 degrees F (36.11 degrees C) oral Pulse rate:   88 / minute BP sitting:   122 / 87  (left arm)  Vitals Entered By: Hilda Blades Ditzler RN (June 11, 2010 2:33 PM) CC: Lipid Management Is Patient Diabetic? Yes Did you bring your meter with you today? No Pain Assessment Patient in pain? yes     Location: legs and feet Intensity: 7 Type: numbness Onset of pain  past month Nutritional Status BMI of > 30 = obese Nutritional Status Detail appetite good CBG Result 309  Have you ever been in a relationship where you felt threatened, hurt or afraid?denies   Does patient need assistance? Functional Status Self care Ambulation Normal Comments Past month numbness in legs and feet. Appt with Dr Aundra Dubin 06/12/10 4PM per Dr Stanford Scotland.   Primary Care Provider:  Barton Dubois MD  CC:  Lipid Management.  History of Present Illness: Follow up appointment: 1. DM --unable to obtain lantus through [facility] for 3 months. Reports craving for sweets. Feels very "frustrated." 2. HTN. Denies any side-effectrs with meds. 3. HLD -no muscle aches with statin; no exercise.  Depression History:      The patient denies a depressed mood most of the day and a diminished interest in her usual daily activities.         Lipid Management History:      Positive NCEP/ATP III risk factors include diabetes, HDL cholesterol less than 40, and hypertension.  Negative NCEP/ATP III risk factors include female age less than 10 years old, no family history for ischemic heart disease, and non-tobacco-user status.        The patient expresses understanding of adjunctive measures for cholesterol lowering.  Adjunctive measures started by the patient include aerobic exercise, fiber, ASA, and weight reduction.  She expresses no side effects from her  lipid-lowering medication.  The patient denies any symptoms to suggest myopathy or liver disease.          Preventive Screening-Counseling & Management  Alcohol-Tobacco     Smoking Status: never     Passive Smoke Exposure: no  Caffeine-Diet-Exercise     Does Patient Exercise: yes     Type of exercise: walking     Exercise (avg: min/session): 7:00     Times/week: 1  Current Problems (verified): 1)  Redness or Discharge of Eye  (ICD-379.93) 2)  Pain in Soft Tissues of Limb  (ICD-729.5) 3)  Chest Pain  (ICD-786.50) 4)  Hyperlipidemia  (ICD-272.4) 5)  Hypertension  (ICD-401.9) 6)  Sx of Nausea and Vomiting  (ICD-787.01) 7)  Diabetes Mellitus, Type II  (ICD-250.00) 8)  Gerd  (ICD-787.02) 9)  Fecal Occult Blood  (ICD-792.1) 10)  Guaiac Positive Stool  (ICD-578.1) 11)  Shoulder Pain, Left  (ICD-719.41) 12)  Pain in Joint, Hand  (ICD-719.44) 13)  Wrist Pain, Left  (ICD-719.43) 14)  Heat Edema  ([id number]) 15)  Hx of Helicobacter Pylori Infection  (ICD-041.86)  Medications Prior to Update: 1)  Lisinopril 20 Mg Tabs (Lisinopril) .... Take 1 Tablet By Mouth Once A Day 2)  Hydrochlorothiazide 25 Mg Tabs (Hydrochlorothiazide) .... Take 1 Tablet By Mouth Once A Day 3)  Aspirin 81 Mg Chew (Aspirin) .... Take 1 Tablet By Mouth Once  A Day 4)  Lipitor 20 Mg Tabs (Atorvastatin Calcium) .... Take 1 Tablet By Mouth Once A Day 5)  Nexium 40 Mg  Cpdr (Esomeprazole Magnesium) .... Take 1 Capsule By Mouth Twice A Day 6)  Metformin Hcl 1000 Mg  Tabs (Metformin Hcl) .... Take 1 Pill Twice Daily With Food At Breakfast and Evening Meal 7)  Lantus 100 Unit/ml Soln (Insulin Glargine) .... Inject 30 Units Subcutaneouslyin The Morning and 30 Units At Evening. (12 Hours Apart). 8)  Lancets  Misc (Lancets) .... Use For Two Times A Day Monitoring of Blood Glucose. 9)  Mobic 15 Mg Tabs (Meloxicam) .... Take 1 Tablet By Mouth Two Times A Day  Allergies: 1)  ! Elavil  Directives (verified): 1)  Full  Code   Past History:  Past Medical History: Last updated: 01/06/2010 1. Diabetes mellitus, type II 2. Hyperlipidemia 3. Hypertension 4. h/o chest pain: negative cardiolite September 2007.  ETT-myoview 12/10 with EF 76%, no ischemia, probable breast attenuation.  Episode chest pain in 12/10 probably costochondritis.                                                                5. GERD: H/o H. pylori positive treated with Prev pac 11/07 6. H/o Endometriosis  Past Surgical History: Last updated: 11/12/2008 Hysterectomy sec to DUB     Risk Factors: Smoking Status: never (06/11/2010) Passive Smoke Exposure: no (06/11/2010)  Family History: Reviewed history from 11/05/2009 and no changes required. Mother and grandfather had diabetes.  Several family members have HTN and have had cancer of unknown type.  no known colon cancer.  She thinks her grandfather had an MI.   Social History: Reviewed history from 11/05/2009 and no changes required. She has trouble financially which leads to trying to make meds last longer by decreasing frequency of doses. Not married.  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   Review of Systems       per HPI  Physical Exam  General:  Well-developed,well-nourished,in no acute distress; alert,appropriate and cooperative throughout examination Head:  Normocephalic and atraumatic without obvious abnormalities. No apparent alopecia or balding. Eyes:  No corneal or conjunctival inflammation noted. EOMI. Perrla. Funduscopic exam benign, without hemorrhages, exudates or papilledema. Vision grossly normal. Ears:  no external deformities.   Nose:  no external deformity.   Mouth:  pharynx pink and moist, no erythema, and no exudates.   Neck:  supple, full ROM, no masses, no thyromegaly, no thyroid nodules or tenderness, and no cervical lymphadenopathy.   Lungs:  normal respiratory effort, no intercostal retractions, no accessory muscle  use, normal breath sounds, no crackles, and no wheezes.   Heart:  normal rate, regular rhythm, no murmur, no gallop, and no rub.   Abdomen:  soft, non-tender, normal bowel sounds, no guarding, no rigidity, and no rebound tenderness.   Msk:  Right middle finger with decreased range ofmotion and also mildly enlarged PIP. no joint warmth, no redness over joints, and no crepitation.   Pulses:  R radial normal and L radial normal.   Extremities:  No clubbing, cyanosis, edema, or deformity noted with normal full range of motion of all joints.   Neurologic:  No cranial nerve deficits noted. Station and gait are normal.  Plantar reflexes are down-going bilaterally. DTRs are symmetrical throughout. Sensory, motor and coordinative functions appear intact. Skin:  turgor normal, color normal, and no rashes.   Cervical Nodes:  No lymphadenopathy noted Psych:  Cognition and judgment appear intact. Alert and cooperative with normal attention span and concentration. No apparent delusions, illusions, hallucinations  Diabetes Management Exam:    Foot Exam (with socks and/or shoes not present):       Sensory-Pinprick/Light touch:          Left medial foot (L-4): normal          Left dorsal foot (L-5): normal          Left lateral foot (S-1): normal          Right medial foot (L-4): normal          Right dorsal foot (L-5): normal          Right lateral foot (S-1): normal       Sensory-Monofilament:          Left foot: normal          Right foot: normal       Inspection:          Left foot: normal          Right foot: normal       Nails:          Left foot: normal          Right foot: normal    Foot Exam by Podiatrist:       Date: 06/11/2010       Results: no diabetic findings       Done by: Stanford Scotland   Impression & Recommendations:  Problem # 1:  HYPERTENSION (ICD-401.9)  Controlled. Exercise, diet reviewed. Her updated medication list for this problem includes:    Lisinopril 20 Mg Tabs  (Lisinopril) .Marland Kitchen... Take 1 tablet by mouth once a day    Hydrochlorothiazide 25 Mg Tabs (Hydrochlorothiazide) .Marland Kitchen... Take 1 tablet by mouth once a day  Orders: T-Basic Metabolic Panel (99991111) 12 Lead EKG (12 Lead EKG)  BP today: 122/87 Prior BP: 130/89 (01/07/2010)  Labs Reviewed: K+: 3.8 (12/30/2009) Creat: : 0.73 (12/30/2009)   Chol: 121 (11/20/2009)   HDL: 33.60 (11/20/2009)   LDL: 66 (11/20/2009)   TG: 106.0 (11/20/2009)  Her updated medication list for this problem includes:    Lisinopril 20 Mg Tabs (Lisinopril) .Marland Kitchen... Take 1 tablet by mouth once a day    Hydrochlorothiazide 25 Mg Tabs (Hydrochlorothiazide) .Marland Kitchen... Take 1 tablet by mouth once a day  Problem # 2:  DIABETES MELLITUS, TYPE II (ICD-250.00)  Uncontrolled due to unavailability of Lantus through [facility]. Patient states that was not taking Lantus for 3 months. Admits that her diet is high in sweets. Referred to Ms. Rhiley for DME. Lantus restarted. Her updated medication list for this problem includes:    Lisinopril 20 Mg Tabs (Lisinopril) .Marland Kitchen... Take 1 tablet by mouth once a day    Aspirin 81 Mg Chew (Aspirin) .Marland Kitchen... Take 1 tablet by mouth once a day    Metformin Hcl 1000 Mg Tabs (Metformin hcl) .Marland Kitchen... Take 1 pill twice daily with food at breakfast and evening meal    Lantus 100 Unit/ml Soln (Insulin glargine) ..... Inject 30 units subcutaneouslyin the morning and 30 units at evening. (12 hours apart).  Orders: T- Capillary Blood Glucose ([id number]) T-Hgb A1C (in-house) [id number]) T-Urine Microalbumin w/creat. ratio [id number]) DME Referral (DME)  Her updated medication list for  this problem includes:    Lisinopril 20 Mg Tabs (Lisinopril) .Marland Kitchen... Take 1 tablet by mouth once a day    Aspirin 81 Mg Chew (Aspirin) .Marland Kitchen... Take 1 tablet by mouth once a day    Metformin Hcl 1000 Mg Tabs (Metformin hcl) .Marland Kitchen... Take 1 pill twice daily with food at breakfast and evening meal    Lantus 100 Unit/ml Soln (Insulin glargine) .....  Inject 30 units subcutaneouslyin the morning and 30 units at evening. (12 hours apart).  Labs Reviewed: Creat: 0.73 (12/30/2009)     Last Eye Exam: No diabetic retinopathy.  OU Visual acuity OD :      20/20 Visual acuity OS :     20/20 Intraocular pressure OD:   21   Intraocular pressure OS:   20    (05/01/2009) Reviewed HgBA1c results: 12.2 (12/30/2009)  12.2 (12/30/2009)  Labs Reviewed: Creat: 0.73 (12/30/2009)     Last Eye Exam: No diabetic retinopathy.  OU Visual acuity OD :      20/20 Visual acuity OS :     20/20 Intraocular pressure OD:   21   Intraocular pressure OS:   20    (05/01/2009) Reviewed HgBA1c results: 8.8 (06/11/2010)  12.2 (12/30/2009)  Problem # 3:  HYPERLIPIDEMIA (ICD-272.4)  Diet, exercise discussed. Her updated medication list for this problem includes:    Lipitor 20 Mg Tabs (Atorvastatin calcium) .Marland Kitchen... Take 1 tablet by mouth once a day  Orders: T-Lipid Profile [id number])  Labs Reviewed: SGOT: 29 (11/20/2009)   SGPT: 26 (11/20/2009)   HDL:33.60 (11/20/2009), 34 (03/21/2009)  LDL:66 (11/20/2009), 46 (03/21/2009)  Chol:121 (11/20/2009), 112 (03/21/2009)  Trig:106.0 (11/20/2009), 159 (03/21/2009)  Labs Reviewed: SGOT: 29 (11/20/2009)   SGPT: 26 (11/20/2009)  Lipid Goals: Chol Goal: 200 (06/11/2010)   HDL Goal: 40 (06/11/2010)   LDL Goal: 100 (06/11/2010)   TG Goal: 150 (06/11/2010)  10 Yr Risk Heart Disease: 20 %   HDL:33.60 (11/20/2009), 34 (03/21/2009)  LDL:66 (11/20/2009), 46 (03/21/2009)  Chol:121 (11/20/2009), 112 (03/21/2009)  Trig:106.0 (11/20/2009), 159 (03/21/2009)  Complete Medication List: 1)  Lisinopril 20 Mg Tabs (Lisinopril) .... Take 1 tablet by mouth once a day 2)  Hydrochlorothiazide 25 Mg Tabs (Hydrochlorothiazide) .... Take 1 tablet by mouth once a day 3)  Aspirin 81 Mg Chew (Aspirin) .... Take 1 tablet by mouth once a day 4)  Lipitor 20 Mg Tabs (Atorvastatin calcium) .... Take 1 tablet by mouth once a day 5)  Nexium 40 Mg  Cpdr (Esomeprazole magnesium) .... Take 1 capsule by mouth twice a day 6)  Metformin Hcl 1000 Mg Tabs (Metformin hcl) .... Take 1 pill twice daily with food at breakfast and evening meal 7)  Lantus 100 Unit/ml Soln (Insulin glargine) .... Inject 30 units subcutaneouslyin the morning and 30 units at evening. (12 hours apart). 8)  Lancets Misc (Lancets) .... Use for two times a day monitoring of blood glucose.  Other Orders: T-PAP ([place]) [phone]) T-CBC No Diff [id number])  Lipid Assessment/Plan:      Based on NCEP/ATP III, the patient's risk factor category is "history of diabetes".  The patient's lipid goals are as follows: Total cholesterol goal is 200; LDL cholesterol goal is 100; HDL cholesterol goal is 40; Triglyceride goal is 150.    Patient Instructions: 1)  Please, take your medications as prescribed. 2)  Call if you have any problems with obtaining your insulin in the future!!!! 3)  Please follow up with all referrals --our clinic will contact you with the  information. 4)  Please, make an appointment with Kennieth Rad. 5)  Follow up with Dr. Dyann Kief in 8 weeks (fasting). Process Orders Check Orders Results:     [facility]: Order checked:     Milana Obey MD NOT AUTHORIZED TO ORDER Tests Sent for requisitioning (June 14, 2010 11:01 AM):     06/11/2010: [facility] -- T-Urine Microalbumin w/creat. ratio [[id number]] (signed)     06/11/2010: [facility] -- T-Lipid Profile [id number] (signed)     06/11/2010: [facility] -- T-Basic Metabolic Panel 0000000 (signed)     06/11/2010: [facility] -- T-CBC No Diff [id number] (signed)    Prevention & Chronic Care Immunizations   Influenza vaccine: Fluvax 3+  (09/09/2009)   Influenza vaccine deferral: Deferred  (05/24/2009)   Influenza vaccine due: 08/07/2009    Tetanus booster: Not documented   Td booster deferral: Deferred   (05/24/2009)    Pneumococcal vaccine: Not documented   Pneumococcal vaccine deferral: Deferred  (05/24/2009)  Colorectal Screening   Hemoccult: Not documented   Hemoccult action/deferral: Ordered  (06/11/2010)   Hemoccult due: 05/24/2010    Colonoscopy: Location:  [place].    (12/18/2008)   Colonoscopy action/deferral: Deferred  (05/24/2009)   Colonoscopy due: 12/18/2018  Other Screening   Pap smear: Not documented   Pap smear action/deferral: Ordered  (06/11/2010)   Pap smear due: 06/11/2013    Mammogram: IMPRESSION:     No specific mammographic evidence of malignancy.                    ASSESSMENT: Negative - BI-RADS 1            (12/10/2009)   Mammogram action/deferral: Next screening  mammogram is recommended in one year.  (12/10/2009)   Mammogram due: 12/2010   Smoking status: never  (06/11/2010)  Diabetes Mellitus   HgbA1C: 8.8  (06/11/2010)   Hemoglobin A1C due: 12/10/2009    Eye exam: No diabetic retinopathy.  OU Visual acuity OD :      20/20 Visual acuity OS :     20/20 Intraocular pressure OD:   21   Intraocular pressure OS:   20     (05/01/2009)   Diabetic eye exam action/deferral: Ophthalmology referral  (06/11/2010)   Eye exam due: 05/2010    Foot exam: yes  (06/11/2010)   Foot exam action/deferral: Do today   High risk foot: No  (12/30/2009)   Foot care education: Done  (12/30/2009)   Foot exam due: 06/12/2011    Urine microalbumin/creatinine ratio: 15.8  (06/13/2009)   Urine microalbumin action/deferral: Ordered   Urine microalbumin/cr due: 06/12/2011    Diabetes flowsheet reviewed?: Yes   Progress toward A1C goal: Unchanged    Stage of readiness to change (diabetes management): Preparation  Lipids   Total Cholesterol: 121  (11/20/2009)   Lipid panel action/deferral: Lipid Panel ordered   LDL: 66  (11/20/2009)   LDL Direct: Not documented   HDL: 33.60  (11/20/2009)   Triglycerides: 106.0  (11/20/2009)   Lipid panel  due: 12/12/2010    SGOT (AST): 29  (11/20/2009)   SGPT (ALT): 26  (11/20/2009)   Alkaline phosphatase: 70  (11/20/2009)   Total bilirubin: 0.9  (11/20/2009)   Liver panel due: 06/12/2011    Lipid flowsheet reviewed?: Yes   Progress toward LDL goal: At goal  Hypertension   Last Blood Pressure: 122 / 87  (06/11/2010)   Serum creatinine: 0.73  (12/30/2009)  BMP action: Ordered   Serum potassium 3.8  (0000000)   Basic metabolic panel due: 99991111    Hypertension flowsheet reviewed?: Yes   Progress toward BP goal: At goal    Stage of readiness to change (hypertension management): Maintenance  Self-Management Support :   Personal Goals (by the next clinic visit) :     Personal A1C goal: 7  (09/30/2009)     Personal blood pressure goal: 130/80  (06/13/2009)     Personal LDL goal: 70  (06/13/2009)    Patient will work on the following items until the next clinic visit to reach self-care goals:     Medications and monitoring: take my medicines every day, check my blood sugar, bring all of my medications to every visit, examine my feet every day  (06/11/2010)     Eating: drink diet soda or water instead of juice or soda, eat more vegetables, use fresh or frozen vegetables, eat fruit for snacks and desserts  (06/11/2010)     Activity: take a 30 minute walk every day, park at the far end of the parking lot  (06/11/2010)     Other: bring meter to every visit  (12/30/2009)     Home glucose monitoring frequency: 2 times a day  (06/13/2009)    Diabetes self-management support: Written self-care plan, Education handout, Resources for patients handout  (06/11/2010)   Diabetes care plan printed   Diabetes education handout printed   Last diabetes self-management training by diabetes educator: 11/28/2007   Last medical nutrition therapy: 10/22/2008    Hypertension self-management support: Written self-care plan, Education handout, Resources for patients handout  (06/11/2010)    Hypertension self-care plan printed.   Hypertension education handout printed    Lipid self-management support: Written self-care plan, Education handout, Resources for patients handout  (06/11/2010)   Lipid self-care plan printed.   Lipid education handout printed      Resource handout printed.   Nursing Instructions: Give tetanus booster today Provide Hemoccult cards with instructions (see order) Pap smear today Refer for screening diabetic eye exam (see order)   Laboratory Results   Blood Tests   Date/Time Received: June 11, 2010 3:01 PM  Date/Time Reported: Lenoria Farrier  June 11, 2010 3:01 PM   HGBA1C: 8.8%   (Normal Range: Non-Diabetic - 3-6%   Control Diabetic - 6-8%) CBG Random:: 309mg /dL

## 2011-01-06 NOTE — Progress Notes (Signed)
Summary: diabetes prescriptions/dmr  Phone Note Outgoing Call   Call placed by: Barnabas Harries RD,CDE,  June 19, 2010 4:35 PM Summary of Call: needs new prescriptions for Novolog and Lantus and testing supplies sent to [facility]. Quantities are for 3 months and needs to be sent by nurse to [facility] by fax I believe. thanks.    Prescriptions: LANCETS  MISC (LANCETS) use to check blood sugar 4x daily  #200 x 3   Entered by:   Barnabas Harries RD,CDE   Authorized by:   Milta Deiters MD   Signed by:   Milta Deiters MD on 06/19/2010   Method used:   Print then Give to Patient   RxID:   [id number] TEST  STRP (GLUCOSE BLOOD) use to test blood sugar 4x a day before meals and bedtime  #450 x 3   Entered by:   Barnabas Harries RD,CDE   Authorized by:   Milta Deiters MD   Signed by:   Milta Deiters MD on 06/19/2010   Method used:   Print then Give to Patient   RxID:   [id number] RELION MINI PEN NEEDLES 31G X 6 MM MISC (INSULIN PEN NEEDLE) use to take insulin injections three times a day before meals  #3 boxes x 3   Entered by:   Barnabas Harries RD,CDE   Authorized by:   Milta Deiters MD   Signed by:   Milta Deiters MD on 06/19/2010   Method used:   Print then Give to Patient   RxID:   [id number] NOVOLOG FLEXPEN 100 UNIT/ML SOLN (INSULIN ASPART) inject 6 units 10 minutes before each meal three times a day, if you skip a meal, skip the injection  #2 boxes x 3   Entered by:   Barnabas Harries RD,CDE   Authorized by:   Milta Deiters MD   Signed by:   Milta Deiters MD on 06/19/2010   Method used:   Print then Give to Patient   RxID:   [id number] LANCETS  MISC (LANCETS) Use for two times a day monitoring of blood glucose.  #200 x 3   Entered by:   Barnabas Harries RD,CDE   Authorized by:   Milta Deiters MD   Signed by:   Milta Deiters MD on 06/19/2010   Method used:   Print then Give to Patient   RxID:   [id number] LANTUS 100 UNIT/ML SOLN (INSULIN  GLARGINE) inject 25 units subcutaneouslyin the morning when you wake up  #3 vials x 3   Entered by:   Barnabas Harries RD,CDE   Authorized by:   Milta Deiters MD   Signed by:   Milta Deiters MD on 06/19/2010   Method used:   Print then Give to Patient   RxID:   [id number]   Appended Document: diabetes prescriptions/dmr Above Rx's faxed to [facility].

## 2011-01-06 NOTE — Progress Notes (Signed)
Summary: med refill/gp  Phone Note Refill Request Message from:  Patient on Apr 09, 2010 8:57 AM  Refills Requested: Medication #1:  LANTUS 100 UNIT/ML SOLN inject 30 units subcutaneouslyin the morning and 30 units at evening. (12 hours apart).  Method Requested: Telephone to Pharmacy Initial call taken by: Morrison Old RN,  Apr 09, 2010 8:57 AM  Follow-up for Phone Call        Refill approved-nurse to complete    Prescriptions: LANTUS 100 UNIT/ML SOLN (INSULIN GLARGINE) inject 30 units subcutaneouslyin the morning and 30 units at evening. (12 hours apart).  #3 vials x 5   Entered and Authorized by:   Barton Dubois MD   Signed by:   Barton Dubois MD on 04/09/2010   Method used:   Telephoned to ...       [facility] Department (retail)       [address] [place], [place]  [address]       Ph: [phone]       Fax: [phone]   RxID:   [id number]   Appended Document: med refill/gp Lantus Rx called to [place].

## 2011-01-06 NOTE — Assessment & Plan Note (Signed)
Summary: F1Y/ABN EKG/JML   Primary Provider:  Dr. Stanford Scotland  CC:  pt went to [facility] outpatient clinic.  Pt was having trouble with her blood sugars.  .  History of Present Illness: 55 yo with history of diabetes, HTN, hyperlipidemia returns for evaluation of chest pain. She has a long history of episodes of atypical chest pain, often reproducible by palpation of her chest wall and possibly due to costochondritis.  She had a negative myoview in December.  She was sent back for evaluation for recurrent chest pain.  It is similar to her pain in December leading to the stress test.  She again has chest wall pain that is reproducible with palpation of her mid sternum.  It is not related to exertion and is mild but constant.  She has been under a lot of emotional stress recently, similar to last December.  She was also reported to have an abnormal ECG but my review of the ECG today shows and rSR' pattern in V1 and V2 which is nonspecific.   Labs (4/10): TG 154, HDL 34, LDL 46 Labs (10/10): creatinine 0.85 Labs (12/10): LDL 66, HDL 34  ECG: NSR, rSR' pattern in V1 and V2  Current Medications (verified): 1)  Lisinopril 20 Mg Tabs (Lisinopril) .... Take 1 Tablet By Mouth Once A Day 2)  Hydrochlorothiazide 25 Mg Tabs (Hydrochlorothiazide) .... Take 1 Tablet By Mouth Once A Day 3)  Aspirin 81 Mg Chew (Aspirin) .... Take 1 Tablet By Mouth Once A Day 4)  Lipitor 20 Mg Tabs (Atorvastatin Calcium) .... Take 1 Tablet By Mouth Once A Day 5)  Nexium 40 Mg  Cpdr (Esomeprazole Magnesium) .... Take 1 Capsule By Mouth Twice A Day 6)  Metformin Hcl 1000 Mg  Tabs (Metformin Hcl) .... Take 1 Pill Twice Daily With Food At Breakfast and Evening Meal 7)  Lantus 100 Unit/ml Soln (Insulin Glargine) .... Inject 30 Units Subcutaneouslyin The Morning and 30 Units At Evening. (12 Hours Apart). 8)  Lancets  Misc (Lancets) .... Use For Two Times A Day Monitoring of Blood Glucose.  Allergies (verified): 1)  !  Elavil  Past History:  Past Medical History: Reviewed history from 01/06/2010 and no changes required. 1. Diabetes mellitus, type II 2. Hyperlipidemia 3. Hypertension 4. h/o chest pain: negative cardiolite September 2007.  ETT-myoview 12/10 with EF 76%, no ischemia, probable breast attenuation.  Episode chest pain in 12/10 probably costochondritis.                                                                5. GERD: H/o H. pylori positive treated with Prev pac 11/07 6. H/o Endometriosis  Family History: Reviewed history from 11/05/2009 and no changes required. Mother and grandfather had diabetes.  Several family members have HTN and have had cancer of unknown type.  no known colon cancer.  She thinks her grandfather had an MI.   Social History: Reviewed history from 11/05/2009 and no changes required. She has trouble financially which leads to trying to make meds last longer by decreasing frequency of doses. Not married.  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   Review of Systems       All systems reviewed and negative except as per HPI.  Vital Signs:  Patient profile:   55 year old female Height:      57 inches Weight:      173 pounds BMI:     37.57 Pulse rate:   72 / minute Pulse rhythm:   regular BP sitting:   116 / 84  (left arm) Cuff size:   large  Vitals Entered By: Doug Sou CMA (June 12, 2010 4:17 PM)  Physical Exam  General:  Well developed, well nourished, in no acute distress. Neck:  Neck supple, no JVD. No masses, thyromegaly or abnormal cervical nodes. Lungs:  Clear bilaterally to auscultation and percussion. Heart:  Non-displaced PMI, chest wall with central substernal tenderness to palpation; regular rate and rhythm, S1, S2 without murmurs, rubs or gallops. Carotid upstroke normal, no bruit. Pedals normal pulses. No edema, no varicosities. Abdomen:  Bowel sounds positive; abdomen soft and non-tender without masses,  organomegaly, or hernias noted. No hepatosplenomegaly. Extremities:  No clubbing or cyanosis. Neurologic:  Alert and oriented x 3. Psych:  Normal affect.   Impression & Recommendations:  Problem # 1:  CHEST PAIN (ICD-786.50) Patient has chest pain that is highly reproducible with sternal palpation.  Pain is not exertional.  It is similar to symptoms in December at which time she had a negative myoview.  She could have recurrent costochondritis.  Her ECG has no significant abnormalities.  I will get an echo to make sure that there are no significant cardiac abnormalities.    Other Orders: Echocardiogram (Echo)  Patient Instructions: 1)  Your physician has requested that you have an echocardiogram.  Echocardiography is a painless test that uses sound waves to create images of your heart. It provides your doctor with information about the size and shape of your heart and how well your heart's chambers and valves are working.  This procedure takes approximately one hour. There are no restrictions for this procedure. 2)  Your physician wants you to follow-up in: 1 year with Dr Aundra Dubin.   You will receive a reminder letter in the mail two months in advance. If you don't receive a letter, please call our office to schedule the follow-up appointment.

## 2011-01-06 NOTE — Assessment & Plan Note (Signed)
Summary: EST-CK/FU/MEDS/CFB   Vital Signs:  Patient profile:   55 year old female Height:      57 inches (144.78 cm) Weight:      176.3 pounds (80.14 kg) BMI:     38.29 Temp:     97.6 degrees F (36.44 degrees C) oral Pulse rate:   74 / minute BP sitting:   130 / 89  (right arm)  Vitals Entered By: Hilda Blades Ditzler RN (January 07, 2010 2:21 PM) Is Patient Diabetic? Yes Did you bring your meter with you today? No Pain Assessment Patient in pain? yes     Location: h/a Intensity: 10+ Type: hurts Onset of pain  past 3-4 days Nutritional Status BMI of 25 - 29 = overweight Nutritional Status Detail appetite good CBG Result 65  Have you ever been in a relationship where you felt threatened, hurt or afraid?denies   Does patient need assistance? Functional Status Self care Ambulation Normal Comments Ck right eye - red past week and ck right hand mid finger.   Primary Care Provider:  Barton Dubois MD   History of Present Illness: 55 y/o female with pmh as described on the EMR. Who come to the clinic complaining of right middle finger pain in her PIP, inttermitent HA's and also right red eye.  Pt reports that her eye started itching and draining for the last 3-4 days; her HA is normally at night and she said that is worse if she try to fixed her vision (patient use corrective glasses but has not been waringthem lately).   She denies any CP, abd. pain, nausea, vomiting, severe reflux or any other complaints. She endorses been hungrier now that her insulin was adjusted.  BP 130/89 her CBG was 65 today (she missed breakfast and just ate a salad prior coming here). Pt denies any hypoglycemia event while checking her CBG's at home. She received orange juice and crackers at the office.  Depression History:      The patient denies a depressed mood most of the day and a diminished interest in her usual daily activities.        The patient denies that she feels like life is not worth  living, denies that she wishes that she were dead, and denies that she has thought about ending her life.         Preventive Screening-Counseling & Management  Alcohol-Tobacco     Smoking Status: never     Passive Smoke Exposure: no  Caffeine-Diet-Exercise     Does Patient Exercise: yes     Type of exercise: walking     Exercise (avg: min/session): 7:00     Times/week: 1  -  Date:  12/30/2009    HgBA1c 12.2  Problems Prior to Update: 1)  Chest Pain  (ICD-786.50) 2)  Hyperlipidemia  (ICD-272.4) 3)  Hypertension  (ICD-401.9) 4)  Sx of Nausea and Vomiting  (ICD-787.01) 5)  Diabetes Mellitus, Type II  (ICD-250.00) 6)  Gerd  (ICD-787.02) 7)  Fecal Occult Blood  (ICD-792.1) 8)  Guaiac Positive Stool  (ICD-578.1) 9)  Shoulder Pain, Left  (ICD-719.41) 10)  Pain in Joint, Hand  (ICD-719.44) 11)  Wrist Pain, Left  (ICD-719.43) 12)  Heat Edema  ([id number]) 13)  Hx of Helicobacter Pylori Infection  (ICD-041.86)  Current Problems (verified): 1)  Chest Pain  (ICD-786.50) 2)  Hyperlipidemia  (ICD-272.4) 3)  Hypertension  (ICD-401.9) 4)  Sx of Nausea and Vomiting  (ICD-787.01) 5)  Diabetes Mellitus, Type II  (ICD-250.00)  6)  Gerd  (ICD-787.02) 7)  Fecal Occult Blood  (ICD-792.1) 8)  Guaiac Positive Stool  (ICD-578.1) 9)  Shoulder Pain, Left  (ICD-719.41) 10)  Pain in Joint, Hand  (ICD-719.44) 11)  Wrist Pain, Left  (ICD-719.43) 12)  Heat Edema  (AB-123456789) 13)  Hx of Helicobacter Pylori Infection  (ICD-041.86)  Medications Prior to Update: 1)  Lisinopril 20 Mg Tabs (Lisinopril) .... Take 1 Tablet By Mouth Once A Day 2)  Hydrochlorothiazide 25 Mg Tabs (Hydrochlorothiazide) .... Take 1 Tablet By Mouth Once A Day 3)  Aspirin 81 Mg Chew (Aspirin) .... Take 1 Tablet By Mouth Once A Day 4)  Lipitor 20 Mg Tabs (Atorvastatin Calcium) .... Take 1 Tablet By Mouth Once A Day 5)  Nexium 40 Mg  Cpdr (Esomeprazole Magnesium) .... Take 1 Capsule By Mouth Twice A Day 6)  Metformin Hcl 1000 Mg   Tabs (Metformin Hcl) .... Take 1 Pill Twice Daily With Food At Breakfast and Evening Meal 7)  Lantus 100 Unit/ml Soln (Insulin Glargine) .... Inject 50 Units Subcutaneously At Bedtime Daily 8)  Lancets  Misc (Lancets) .... Use For Two Times A Day Monitoring of Blood Glucose. 9)  Ibuprofen 600 Mg Tabs (Ibuprofen) .... Take 1 Tab Every 6 Hrs As Needed For Chest Pain For Up To 2 Weeks.  Current Medications (verified): 1)  Lisinopril 20 Mg Tabs (Lisinopril) .... Take 1 Tablet By Mouth Once A Day 2)  Hydrochlorothiazide 25 Mg Tabs (Hydrochlorothiazide) .... Take 1 Tablet By Mouth Once A Day 3)  Aspirin 81 Mg Chew (Aspirin) .... Take 1 Tablet By Mouth Once A Day 4)  Lipitor 20 Mg Tabs (Atorvastatin Calcium) .... Take 1 Tablet By Mouth Once A Day 5)  Nexium 40 Mg  Cpdr (Esomeprazole Magnesium) .... Take 1 Capsule By Mouth Twice A Day 6)  Metformin Hcl 1000 Mg  Tabs (Metformin Hcl) .... Take 1 Pill Twice Daily With Food At Breakfast and Evening Meal 7)  Lantus 100 Unit/ml Soln (Insulin Glargine) .... Inject 50 Units Subcutaneously At Bedtime Daily 8)  Lancets  Misc (Lancets) .... Use For Two Times A Day Monitoring of Blood Glucose. 9)  Ibuprofen 600 Mg Tabs (Ibuprofen) .... Take 1 Tab Every 6 Hrs As Needed For Chest Pain For Up To 2 Weeks.  Allergies: 1)  ! Elavil  Past History:  Past Medical History: Last updated: 01/06/2010 1. Diabetes mellitus, type II 2. Hyperlipidemia 3. Hypertension 4. h/o chest pain: negative cardiolite September 2007.  ETT-myoview 12/10 with EF 76%, no ischemia, probable breast attenuation.  Episode chest pain in 12/10 probably costochondritis.                                                                5. GERD: H/o H. pylori positive treated with Prev pac 11/07 6. H/o Endometriosis  Past Surgical History: Last updated: 11/12/2008 Hysterectomy sec to DUB     Family History: Last updated: 11/05/2009 Mother and grandfather had diabetes.  Several family members  have HTN and have had cancer of unknown type.  no known colon cancer.  She thinks her grandfather had an MI.   Social History: Last updated: 11/05/2009 She has trouble financially which leads to trying to make meds last longer by decreasing frequency of doses. Not married.  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   Risk Factors: Exercise: yes (01/07/2010)  Risk Factors: Smoking Status: never (01/07/2010) Passive Smoke Exposure: no (01/07/2010)  Review of Systems       As per HPI.  Physical Exam  General:  alert, well-developed, well-nourished, and well-hydrated.   Eyes:  mild right conjunctival injection and increased tearing. Rest of the exam WNL. Lungs:  normal respiratory effort, no intercostal retractions, no accessory muscle use, normal breath sounds, no crackles, and no wheezes.   Heart:  normal rate, regular rhythm, no murmur, no gallop, and no rub.   Abdomen:  soft, non-tender, normal bowel sounds, no guarding, no rigidity, and no rebound tenderness.   Msk:  Right middle finger with decreased range ofmotion and also mildly enlarged PIP. no joint warmth, no redness over joints, and no crepitation.   Neurologic:  alert & oriented X3 and cranial nerves II-XII grossly intact; gait normal and DTRs symmetrical and normal.     Impression & Recommendations:  Problem # 1:  PAIN IN SOFT TISSUES OF LIMB (ICD-729.5) Exam of her middle finger is consistentlywith triggered finger, tendinitis or sinovitis. We have checked a ESR,RF,CRP and CCP, which were WNL. Will provide conservative therapy with mobic two times a day, if pain continues will refer to sports medicne for injection.   Orders: T- * Misc. Laboratory test [id number]) T-C-Reactive Protein [id number] T-Sed Rate (Automated) [id number]) T-Rheumatoid Factor [id number])  Problem # 2:  HYPERTENSION (ICD-401.9) Well controlled and at goal. No medication changes are needed. Patient advised to follow  a low sodium diet and to lose weight.  Her updated medication list for this problem includes:    Lisinopril 20 Mg Tabs (Lisinopril) .Marland Kitchen... Take 1 tablet by mouth once a day    Hydrochlorothiazide 25 Mg Tabs (Hydrochlorothiazide) .Marland Kitchen... Take 1 tablet by mouth once a day  BP today: 130/89 Prior BP: 136/81 (01/06/2010)  Labs Reviewed: K+: 3.8 (09/30/2009) Creat: : 0.85 (09/30/2009)   Chol: 121 (11/20/2009)   HDL: 33.60 (11/20/2009)   LDL: 66 (11/20/2009)   TG: 106.0 (11/20/2009)  Problem # 3:  DIABETES MELLITUS, TYPE II (ICD-250.00) A1C was deteriorated on last visit and her insulin was adjusted. Patient reportsto be hungrirnow, Recommendations for multiple small mealsa day and lowcarb diet was given; patient understand and will followregimen.In 2 months will repeatlabs and continue adjusting her medications as needed. Patient will continue using metformin two times a day.  Her updated medication list for this problem includes:    Lisinopril 20 Mg Tabs (Lisinopril) .Marland Kitchen... Take 1 tablet by mouth once a day    Aspirin 81 Mg Chew (Aspirin) .Marland Kitchen... Take 1 tablet by mouth once a day    Metformin Hcl 1000 Mg Tabs (Metformin hcl) .Marland Kitchen... Take 1 pill twice daily with food at breakfast and evening meal    Lantus 100 Unit/ml Soln (Insulin glargine) ..... Inject 30 units subcutaneouslyin the morning and 30 units at evening. (12 hours apart).  Orders: Capillary Blood Glucose/CBG [id number])  Problem # 4:  REDNESS OR DISCHARGE OF EYE (ICD-379.93) Eye redness, no discharge. Patient reprots itching and also dryness initially. Will recommend to use visine two times a day, to avoid rubbing her eyes and to folow with her eye doctor, she is due anyway for her annualvisit.   Complete Medication List: 1)  Lisinopril 20 Mg Tabs (Lisinopril) .... Take 1 tablet by mouth once a day 2)  Hydrochlorothiazide 25 Mg Tabs (Hydrochlorothiazide) .... Take 1 tablet by  mouth once a day 3)  Aspirin 81 Mg Chew (Aspirin) .... Take 1  tablet by mouth once a day 4)  Lipitor 20 Mg Tabs (Atorvastatin calcium) .... Take 1 tablet by mouth once a day 5)  Nexium 40 Mg Cpdr (Esomeprazole magnesium) .... Take 1 capsule by mouth twice a day 6)  Metformin Hcl 1000 Mg Tabs (Metformin hcl) .... Take 1 pill twice daily with food at breakfast and evening meal 7)  Lantus 100 Unit/ml Soln (Insulin glargine) .... Inject 30 units subcutaneouslyin the morning and 30 units at evening. (12 hours apart). 8)  Lancets Misc (Lancets) .... Use for two times a day monitoring of blood glucose. 9)  Mobic 15 Mg Tabs (Meloxicam) .... Take 1 tablet by mouth two times a day  Patient Instructions: 1)  Keep your appointment with me as previously scheduled. 2)  Take your medications as prescribed. 3)  Used visine twice a day in your right eye and avoid rubbing it. 4)  Do not skip meals and check your blood sugar twice a day. 5)  Please make sure you follow with your eye doctorin order to resolved the issues with your glasses. 6)  warm compresses and hand exercises will help with your finger pain; remember to take your pain medication two times a day for 7 days and then as needed for pain. Prescriptions: MOBIC 15 MG TABS (MELOXICAM) Take 1 tablet by mouth two times a day  #35 x 1   Entered and Authorized by:   Barton Dubois MD   Signed by:   Barton Dubois MD on 01/07/2010   Method used:   Print then Give to Patient   RxID:   [id number]  Process Orders Check Orders Results:     [facility]: [id number] not required for this insurance Tests Sent for requisitioning (January 22, 2010 8:34 AM):     01/07/2010: [facility] -- [place]. Laboratory test [id number] (signed)     01/07/2010: [facility] -- T-C-Reactive Protein [id number] (signed)     01/07/2010: [facility] -- T-Sed Rate (Automated) [id number] (signed)     01/07/2010: [facility] -- T-Rheumatoid Factor [id number]  (signed)    Prevention & Chronic Care Immunizations   Influenza vaccine: Fluvax 3+  (09/09/2009)   Influenza vaccine deferral: Deferred  (05/24/2009)   Influenza vaccine due: 08/07/2009    Tetanus booster: Not documented   Td booster deferral: Deferred  (05/24/2009)    Pneumococcal vaccine: Not documented   Pneumococcal vaccine deferral: Deferred  (05/24/2009)  Colorectal Screening   Hemoccult: Not documented   Hemoccult action/deferral: Deferred  (05/24/2009)   Hemoccult due: 05/24/2010    Colonoscopy: Location:  [facility].    (12/18/2008)   Colonoscopy action/deferral: Deferred  (05/24/2009)   Colonoscopy due: 12/18/2018  Other Screening   Pap smear: Not documented   Pap smear action/deferral: Deferred  (01/07/2010)   Pap smear due: 10/01/2009    Mammogram: IMPRESSION:     No specific mammographic evidence of malignancy.                    ASSESSMENT: Negative - BI-RADS 1            (12/10/2009)   Mammogram action/deferral: Next screening  mammogram is recommended in one year.  (12/10/2009)   Mammogram due: 12/2010   Smoking status: never  (01/07/2010)  Diabetes Mellitus   HgbA1C: 12.2  (12/30/2009)   Hemoglobin A1C due: 12/10/2009  Eye exam: No diabetic retinopathy.  OU Visual acuity OD :      20/20 Visual acuity OS :     20/20 Intraocular pressure OD:   21   Intraocular pressure OS:   20     (05/01/2009)   Eye exam due: 05/2010    Foot exam: yes  (10/11/2008)   High risk foot: No  (10/11/2008)   Foot care education: Done  (10/11/2008)   Foot exam due: 10/11/2009    Urine microalbumin/creatinine ratio: 15.8  (06/13/2009)   Urine microalbumin action/deferral: Ordered   Urine microalbumin/cr due: 06/13/2010    Diabetes flowsheet reviewed?: Yes   Progress toward A1C goal: Deteriorated  Lipids   Total Cholesterol: 121  (11/20/2009)   Lipid panel action/deferral: Deferred   LDL: 66  (11/20/2009)   LDL Direct: Not documented   HDL:  33.60  (11/20/2009)   Triglycerides: 106.0  (11/20/2009)   Lipid panel due: 03/21/2010    SGOT (AST): 29  (11/20/2009)   SGPT (ALT): 26  (11/20/2009)   Alkaline phosphatase: 70  (11/20/2009)   Total bilirubin: 0.9  (11/20/2009)  Hypertension   Last Blood Pressure: 130 / 89  (01/07/2010)   Serum creatinine: 0.85  (09/30/2009)   BMP action: Ordered   Serum potassium 3.8  (09/30/2009)  Self-Management Support :   Personal Goals (by the next clinic visit) :     Personal A1C goal: 7  (09/30/2009)     Personal blood pressure goal: 130/80  (06/13/2009)     Personal LDL goal: 70  (06/13/2009)    Patient will work on the following items until the next clinic visit to reach self-care goals:     Medications and monitoring: take my medicines every day, check my blood sugar, bring all of my medications to every visit, examine my feet every day  (01/07/2010)     Eating: drink diet soda or water instead of juice or soda, eat more vegetables, use fresh or frozen vegetables, eat fruit for snacks and desserts, limit or avoid alcohol  (01/07/2010)     Activity: take a 30 minute walk every day, park at the far end of the parking lot  (01/07/2010)     Home glucose monitoring frequency: 2 times a day  (06/13/2009)    Diabetes self-management support: Written self-care plan  (09/30/2009)   Last diabetes self-management training by diabetes educator: 11/28/2007   Last medical nutrition therapy: 10/22/2008    Hypertension self-management support: Written self-care plan  (09/30/2009)    Lipid self-management support: Written self-care plan  (09/30/2009)

## 2011-01-06 NOTE — Progress Notes (Signed)
Summary: refill/ hla  Phone Note Refill Request Message from:  Fax from Pharmacy on October 17, 2010 11:17 AM  Refills Requested: Medication #1:  LIPITOR 20 MG TABS Take 1 tablet by mouth once a day   Dosage confirmed as above?Dosage Confirmed   Last Refilled: 8/25 last visit 10/2010, last bmet 12/2009  Initial call taken by: Freddy Finner RN,  October 17, 2010 11:19 AM    Prescriptions: LIPITOR 20 MG TABS (ATORVASTATIN CALCIUM) Take 1 tablet by mouth once a day  #90 x 6   Entered and Authorized by:   Barton Dubois MD   Signed by:   Barton Dubois MD on 10/18/2010   Method used:   Telephoned to ...       [facility] (retail)       [address] [place], [place]  [address]       Ph: [phone]       Fax: [phone]   RxID:   [id number]

## 2011-01-06 NOTE — Progress Notes (Signed)
Summary: Refill/gh  Phone Note Refill Request Message from:  Fax from Pharmacy on August 08, 2010 11:34 AM  Refills Requested: Medication #1:  NEXIUM 40 MG  CPDR Take 1 capsule by mouth twice a day   Last Refilled: 06/13/2010  Method Requested: Fax to [facility] Initial call taken by: Sander Nephew RN,  August 08, 2010 11:34 AM  Follow-up for Phone Call        Refill approved-nurse to complete    Prescriptions: NEXIUM 40 MG  CPDR (ESOMEPRAZOLE MAGNESIUM) Take 1 capsule by mouth twice a day  #180 x 5   Entered and Authorized by:   Barton Dubois MD   Signed by:   Barton Dubois MD on 08/09/2010   Method used:   Telephoned to ...       [facility] Department (retail)       [address] [place], [place]  [address]       Ph: [phone]       Fax: [phone]   RxID:   [id number]

## 2011-01-06 NOTE — Progress Notes (Signed)
Summary: phone/gg    Phone Note Call from Patient   Reason for Call: Lab or Test Results Summary of Call: Pt called to make appointment for  numbness to three fingers on rt hand  and 2 fingers on left hand.  Onset 2 - 3 months ago. numbness is constant.  Numbness is not getting better and wants evaluation.   Appointment given for this Thursday.  CBG have been high (292 on saturday)  SHe takes lantus and novolog but has had trouble  getting novolog, so  only took small amount of novolog.  She just received the med last week.  I amde appointment with Barnabas Harries on Thursday also and will have her call pt today for advise on insulin coverage. Pt # [id number]  Will this be okay? Initial call taken by: Gevena Cotton RN,  August 18, 2010 9:50 AM  Follow-up for Phone Call        Please make appointment with PCP as well, and route this note to PCP. Follow-up by: Bertha Stakes MD,  August 18, 2010 10:50 AM  Additional Follow-up for Phone Call Additional follow up Details #1::        Flag sent to [facility]  note sent to PCP Additional Follow-up by: Gevena Cotton RN,  August 18, 2010 11:19 AM

## 2011-01-06 NOTE — Progress Notes (Signed)
Summary: Refill/gh  Phone Note Refill Request Message from:  Fax from Pharmacy on September 25, 2010 10:23 AM  Refills Requested: Medication #1:  HYDROCHLOROTHIAZIDE 25 MG TABS Take 1 tablet by mouth once a day  Medication #2:  METFORMIN HCL 1000 MG  TABS take 1 pill twice daily with food at breakfast and evening meal  Medication #3:  LISINOPRIL 20 MG TABS Take 1 tablet by mouth once a day  Method Requested: Electronic Initial call taken by: Sander Nephew RN,  September 25, 2010 10:23 AM  Follow-up for Phone Call        Refill approved-nurse to complete    Prescriptions: METFORMIN HCL 1000 MG  TABS (METFORMIN HCL) take 1 pill twice daily with food at breakfast and evening meal  #180 x 5   Entered and Authorized by:   Barton Dubois MD   Signed by:   Barton Dubois MD on 09/25/2010   Method used:   Telephoned to ...       [facility] (retail)       [place], [place]  [address]       Ph: [phone]       Fax: [phone]   RxID:   [id number] HYDROCHLOROTHIAZIDE 25 MG TABS (HYDROCHLOROTHIAZIDE) Take 1 tablet by mouth once a day  #100 x 5   Entered and Authorized by:   Barton Dubois MD   Signed by:   Barton Dubois MD on 09/25/2010   Method used:   Telephoned to ...       [facility] (retail)       [place], [place]  [address]       Ph: [phone]       Fax: [phone]   RxID:   [id number] LISINOPRIL 20 MG TABS (LISINOPRIL) Take 1 tablet by mouth once a day  #90 x 6   Entered and Authorized by:   Barton Dubois MD   Signed by:   Barton Dubois MD on 09/25/2010   Method used:   Telephoned to ...       [facility] (retail)       [address] [place], [place]  [address]       Ph: [phone]       Fax: [phone]   RxID:   [id number]

## 2011-01-06 NOTE — Assessment & Plan Note (Signed)
Summary: F/U HAND,MC   Vital Signs:  Patient profile:   55 year old female BP sitting:   126 / 87  Vitals Entered By: April Manson CMA (November 03, 2010 2:23 PM)  Primary Care Provider:  Dr. Stanford Scotland   History of Present Illness: wrist pain about 25% better for a few days after the injection but then returned to baseline. has tapered up on the gabapentin and it does not seem to help. She wants tostyop that. No new symptoms  Current Medications (verified): 1)  Lisinopril 20 Mg Tabs (Lisinopril) .... Take 1 Tablet By Mouth Once A Day 2)  Hydrochlorothiazide 25 Mg Tabs (Hydrochlorothiazide) .... Take 1 Tablet By Mouth Once A Day 3)  Aspirin 81 Mg Chew (Aspirin) .... Take 1 Tablet By Mouth Once A Day 4)  Lipitor 20 Mg Tabs (Atorvastatin Calcium) .... Take 1 Tablet By Mouth Once A Day 5)  Nexium 40 Mg  Cpdr (Esomeprazole Magnesium) .... Take 1 Capsule By Mouth Twice A Day 6)  Metformin Hcl 1000 Mg  Tabs (Metformin Hcl) .... Take 1 Pill Twice Daily With Food At Breakfast and Evening Meal 7)  Lantus 100 Unit/ml Soln (Insulin Glargine) .... Inject 30 Units Subcutaneouslyin The Morning When You Wake Up 8)  Lancets  Misc (Lancets) .... Use For Two Times A Day Monitoring of Blood Glucose. 9)  Novolog Flexpen 100 Unit/ml Soln (Insulin Aspart) .... Inject 6 Units and Self-Titrate Dose To Achieve Target Goal; 10 Minutes Before Each Meal Three Times A Day. (If You Skip A Meal, Skip The Injection) 10)  Relion Mini Pen Needles 31g X 6 Mm Misc (Insulin Pen Needle) .... Use To Take Insulin Injections Three Times A Day Before Meals 11)  Truetrack Test  Strp (Glucose Blood) .... Use To Test Blood Sugar 4x A Day Before Meals and Bedtime 12)  Lancets  Misc (Lancets) .... Use To Check Blood Sugar 4x Daily 13)  Amitriptyline Hcl 50 Mg Tabs (Amitriptyline Hcl) .... Take 1 Tab By Mouth At Bedtime 14)  Neurontin 300 Mg Caps (Gabapentin) .... Taper Up To  Three At Night and Two in Am (Patient Has Written  Taper)  Allergies: 1)  ! Elavil  Review of Systems  The patient denies weight loss.    Physical Exam  Msk:  B hands have full grip strength, no thenar atrophy. Decreased sensation to soft touch B in finger and palm area. Negative Tinel's B. Neg Phal;en's B   Impression & Recommendations:  Problem # 1:  PERIPHERAL NEUROPATHY (ICD-356.9)  Problem # 2:  HAND PAIN, BILATERAL (ICD-729.5) the steroid injection did not help her and neither did the increased dose of gabapentin. She cannot try any PT as she works 12 days on, 2 days off and has a second job too. Her PCP talked aboyt doing some NCS but it is not clear to me those would be helpful. She will rtc as needed. Sorry we could not help her at all.   Complete Medication List: 1)  Lisinopril 20 Mg Tabs (Lisinopril) .... Take 1 tablet by mouth once a day 2)  Hydrochlorothiazide 25 Mg Tabs (Hydrochlorothiazide) .... Take 1 tablet by mouth once a day 3)  Aspirin 81 Mg Chew (Aspirin) .... Take 1 tablet by mouth once a day 4)  Lipitor 20 Mg Tabs (Atorvastatin calcium) .... Take 1 tablet by mouth once a day 5)  Nexium 40 Mg Cpdr (Esomeprazole magnesium) .... Take 1 capsule by mouth twice a day 6)  Metformin Hcl 1000  Mg Tabs (Metformin hcl) .... Take 1 pill twice daily with food at breakfast and evening meal 7)  Lantus 100 Unit/ml Soln (Insulin glargine) .... Inject 30 units subcutaneouslyin the morning when you wake up 8)  Lancets Misc (Lancets) .... Use for two times a day monitoring of blood glucose. 9)  Novolog Flexpen 100 Unit/ml Soln (Insulin aspart) .... Inject 6 units and self-titrate dose to achieve target goal; 10 minutes before each meal three times a day. (if you skip a meal, skip the injection) 10)  Relion Mini Pen Needles 31g X 6 Mm Misc (Insulin pen needle) .... Use to take insulin injections three times a day before meals 11)  Truetrack Test Strp (Glucose blood) .... Use to test blood sugar 4x a day before meals and bedtime 12)   Lancets Misc (Lancets) .... Use to check blood sugar 4x daily 13)  Amitriptyline Hcl 50 Mg Tabs (Amitriptyline hcl) .... Take 1 tab by mouth at bedtime 14)  Neurontin 300 Mg Caps (Gabapentin) .... Taper up to  three at night and two in am (patient has written taper)   Orders Added: 1)  Est. Patient Level III [id number]

## 2011-01-06 NOTE — Progress Notes (Signed)
  Phone Note Outgoing Call   Call placed by: Barnabas Harries RD,CDE,  June 24, 2010 2:10 PM Summary of Call: call to follow up on new insulin:  Patient says sheis "doing well" with new insulin - tried 6 units before meals, and tested afterwards and it was still higher than she wanted it to be so increased to 7 units before each meal. has only missed it once because she missed the meal. will call Friday or so to let us know more.   Dr. Dyann Kief, should we ammend medication record to reflect her self increase/change in Novolog?   Follow-up for Phone Call        yes. Let's change novolog to say self titration sliding scale or something like that.    New/Updated Medications: NOVOLOG FLEXPEN 100 UNIT/ML SOLN (INSULIN ASPART) inject 6 units and self-titrate dose to achieve target goal; 10 minutes before each meal three times a day. (if you skip a meal, skip the injection)

## 2011-01-06 NOTE — Assessment & Plan Note (Signed)
Summary: ACUTE/MADERA/1 MONTH F/U VISIT/CH   Vital Signs:  Patient profile:   55 year old female Height:      57 inches (144.78 cm) Weight:      177.5 pounds (80.68 kg) BMI:     38.55 Temp:     97.0 degrees F (36.11 degrees C) oral Pulse rate:   79 / minute BP sitting:   109 / 81  (left arm) Cuff size:   regular  Vitals Entered By: Lucky Rathke NT II (October 07, 2010 2:32 PM) CC: DM / BILATERAL HAND PAIN  / ROUTINE OFFICE VISIT Is Patient Diabetic? Yes Did you bring your meter with you today? No Pain Assessment Patient in pain? yes     Location: HANDS Intensity:    8 Type:   ACHE/SWELLING Onset of pain  Chronic Nutritional Status BMI of > 30 = obese  Have you ever been in a relationship where you felt threatened, hurt or afraid?No   Does patient need assistance? Functional Status Self care Ambulation Normal   Primary Care Provider:  Dr. Stanford Scotland  CC:  DM / BILATERAL HAND PAIN  / ROUTINE OFFICE VISIT.  History of Present Illness: This is a 55 year old female with DM, hx of diabetic peripheral neuropathy, hypertension and Jerrye Bushy who presents for a 1 month follow up visit for poor DM control and hand tingling and numbness.  Pt was last seen by Dr. Ina Homes and was noted to have worsening DM with an A1C of 10. Lantus was increased by 5 units to 30U at that time.  At that time pt also complained of approximately a 1 year history of bilateral hand pain consistent with carpel tunnel syndrome and pt has seen Dr. Nori Riis in the interim.  At that visit the pt had injection of her right wrist and gabapentin was restarted for a fast taper to 1800 mg dose.  Neither the Injection nor the neurontin eased pts pains.  Pain is discribed as dull and throbbing and is constant.  Pt has also noted some assoicated swelling across the MTP joints, though this comes and goes.  With regards to the patients Diabetes, CBG  in the office today was 331. and Pt states that her home CBG's which she takes daily  have been in the high 270's fasting.  Pt states that this is likely due to dietary indiscretion, as she is taking meds as prescribed.  Pt currently denies and changes in her vision, CP, sob, changes in BM's or blood in stool.  Preventive Screening-Counseling & Management  Alcohol-Tobacco     Alcohol drinks/day: 0     Smoking Status: never     Passive Smoke Exposure: no  Caffeine-Diet-Exercise     Does Patient Exercise: yes     Type of exercise: walking     Exercise (avg: min/session): 7:00     Times/week: 1  Allergies: 1)  ! Elavil  Past History:  Past Medical History: Last updated: 01/06/2010 1. Diabetes mellitus, type II 2. Hyperlipidemia 3. Hypertension 4. h/o chest pain: negative cardiolite September 2007.  ETT-myoview 12/10 with EF 76%, no ischemia, probable breast attenuation.  Episode chest pain in 12/10 probably costochondritis.  5. GERD: H/o H. pylori positive treated with Prev pac 11/07 6. H/o Endometriosis  Family History: Reviewed history from 11/05/2009 and no changes required. Mother and grandfather had diabetes.  Several family members have HTN and have had cancer of unknown type.  no known colon cancer.  She thinks her grandfather had an MI.   Social History: Reviewed history from 09/04/2010 and no changes required. Not married.   Works as a Careers information officer, doing nursing duties.  -bath patients, change linen, clean, transport services (pt weighs greater than 200 lbs), moving bed around to clean -basic hygeine assistance  - has been working in this industry since the 1970s Never smoked.  Homeless - lives with friends and mother   Review of Systems       Negative as per HPI.  Physical Exam  General:  alert, well-developed, and well-nourished.  alert, well-developed, and well-nourished.   Head:  normocephalic and atraumatic.  normocephalic and atraumatic.   Eyes:  vision grossly intact,  pupils equal, pupils round, and pupils reactive to light.  vision grossly intact, pupils equal, pupils round, and pupils reactive to light.   Ears:  R ear normal and L ear normal.  R ear normal and L ear normal.   Nose:  no external deformity and no nasal discharge.  no external deformity and no nasal discharge.   Mouth:  pharynx pink and moist.  pharynx pink and moist.   Neck:  supple.  supple.   Lungs:  normal respiratory effort, normal breath sounds, no crackles, and no wheezes.  normal respiratory effort, normal breath sounds, no crackles, and no wheezes.   Heart:  normal rate, regular rhythm, no murmur, no gallop, no rub, and no JVD.  normal rate, regular rhythm, no murmur, no gallop, no rub, and no JVD.   Abdomen:  soft, non-tender, and normal bowel sounds.  soft, non-tender, and normal bowel sounds.   Msk:  Decreased ROM of the bilateral wrists.  Pulses:  2+ peripheral pulses.  Extremities:  No edema Neurologic:  4/5 grip strength bilaterally.  Sensation intact to soft touch throughout both hands.  There is no thenar/hypothenar atrophy, both Phalen and Tinel tests were positive.  Cranial nerves 2-12 intact.  Skin:  No rashes    Impression & Recommendations:  Problem # 1:  DIABETES MELLITUS, TYPE II (ICD-250.00) Assessment Unchanged Pts diabetes is poorly controlled at this point with CBG in the 300's today.  Pt states that her increased CBG's are because of dietary indiscretion.  The patient is not interested in any diet counseling at this point as she has gone through it in the past.  The patient does appear very motivated at this point and states that if given one month, she will bring down her BS by watching her diet.  At this point, no changes in the pts medications were made and we will follow up closely for improvement and consider medication adjustment at next visit if CBG's continue to be elevated. The pt was refered for a diabetic eye exam.   Her updated medication list for this  problem includes:    Lisinopril 20 Mg Tabs (Lisinopril) .Marland Kitchen... Take 1 tablet by mouth once a day    Aspirin 81 Mg Chew (Aspirin) .Marland Kitchen... Take 1 tablet by mouth once a day    Metformin Hcl 1000 Mg Tabs (Metformin hcl) .Marland Kitchen... Take 1 pill twice daily with food at breakfast and evening meal    Lantus 100 Unit/ml Soln (Insulin glargine) .Marland KitchenMarland KitchenMarland KitchenMarland Kitchen  Inject 30 units subcutaneouslyin the morning when you wake up    Novolog Flexpen 100 Unit/ml Soln (Insulin aspart) ..... Inject 6 units and self-titrate dose to achieve target goal; 10 minutes before each meal three times a day. (if you skip a meal, skip the injection)  Orders: T- Capillary Blood Glucose [id number]) Ophthalmology Referral (Ophthalmology)  Problem # 2:  DIABETIC PERIPHERAL NEUROPATHY (ICD-250.60) Assessment: Unchanged Given that the etiology of her pain continues to remain unknown, and the pts condition is not improving, we will refer the pt for a NCS to evaluate potential causes.  In the interim, the pt was instructed to continue to wear her wrist braces at night and to try wearing thin gloves during the day to try to help improve her symptoms through tactile stimulation.  Her updated medication list for this problem includes:    Lisinopril 20 Mg Tabs (Lisinopril) .Marland Kitchen... Take 1 tablet by mouth once a day    Aspirin 81 Mg Chew (Aspirin) .Marland Kitchen... Take 1 tablet by mouth once a day    Metformin Hcl 1000 Mg Tabs (Metformin hcl) .Marland Kitchen... Take 1 pill twice daily with food at breakfast and evening meal    Lantus 100 Unit/ml Soln (Insulin glargine) ..... Inject 30 units subcutaneouslyin the morning when you wake up    Novolog Flexpen 100 Unit/ml Soln (Insulin aspart) ..... Inject 6 units and self-titrate dose to achieve target goal; 10 minutes before each meal three times a day. (if you skip a meal, skip the injection)  Problem # 3:  HYPERTENSION (ICD-401.9) Assessment: Unchanged BP was within goal today, as such, we will not make any changes to the pts medications  at this time and will continue to monitor.  Her updated medication list for this problem includes:    Lisinopril 20 Mg Tabs (Lisinopril) .Marland Kitchen... Take 1 tablet by mouth once a day    Hydrochlorothiazide 25 Mg Tabs (Hydrochlorothiazide) .Marland Kitchen... Take 1 tablet by mouth once a day  Complete Medication List: 1)  Lisinopril 20 Mg Tabs (Lisinopril) .... Take 1 tablet by mouth once a day 2)  Hydrochlorothiazide 25 Mg Tabs (Hydrochlorothiazide) .... Take 1 tablet by mouth once a day 3)  Aspirin 81 Mg Chew (Aspirin) .... Take 1 tablet by mouth once a day 4)  Lipitor 20 Mg Tabs (Atorvastatin calcium) .... Take 1 tablet by mouth once a day 5)  Nexium 40 Mg Cpdr (Esomeprazole magnesium) .... Take 1 capsule by mouth twice a day 6)  Metformin Hcl 1000 Mg Tabs (Metformin hcl) .... Take 1 pill twice daily with food at breakfast and evening meal 7)  Lantus 100 Unit/ml Soln (Insulin glargine) .... Inject 30 units subcutaneouslyin the morning when you wake up 8)  Lancets Misc (Lancets) .... Use for two times a day monitoring of blood glucose. 9)  Novolog Flexpen 100 Unit/ml Soln (Insulin aspart) .... Inject 6 units and self-titrate dose to achieve target goal; 10 minutes before each meal three times a day. (if you skip a meal, skip the injection) 10)  Relion Mini Pen Needles 31g X 6 Mm Misc (Insulin pen needle) .... Use to take insulin injections three times a day before meals 11)  Truetrack Test Strp (Glucose blood) .... Use to test blood sugar 4x a day before meals and bedtime 12)  Lancets Misc (Lancets) .... Use to check blood sugar 4x daily 13)  Amitriptyline Hcl 50 Mg Tabs (Amitriptyline hcl) .... Take 1 tab by mouth at bedtime 14)  Neurontin 300 Mg Caps (Gabapentin) .Marland KitchenMarland KitchenMarland Kitchen  Taper up to  three at night and two in am (patient has written taper)  Other Orders: Nerve Conduction (Nerve Conduction)  Patient Instructions: 1)  Please improve you diet so that we can see an improvement in your diabetes control.  For your  hand pain, please wear your splints at night and wear thin gloves during the day.  We will schedule you for a nerve conduction study to try to evaluate the cause.   Orders Added: 1)  Est. Patient Level III [id number] 2)  T- Capillary Blood Glucose [82948] 3)  Nerve Conduction [Nerve Conduction] 4)  Ophthalmology Referral [Ophthalmology]     Prevention & Chronic Care Immunizations   Influenza vaccine: Fluvax Non-MCR  (08/21/2010)   Influenza vaccine deferral: Deferred  (05/24/2009)   Influenza vaccine due: 08/07/2009    Tetanus booster: Not documented   Td booster deferral: Deferred  (05/24/2009)    Pneumococcal vaccine: Not documented   Pneumococcal vaccine deferral: Deferred  (05/24/2009)  Colorectal Screening   Hemoccult: Not documented   Hemoccult action/deferral: Ordered  (06/11/2010)   Hemoccult due: 05/24/2010    Colonoscopy: Location:  [facility].    (12/18/2008)   Colonoscopy action/deferral: Deferred  (05/24/2009)   Colonoscopy due: 12/18/2018  Other Screening   Pap smear: Not documented   Pap smear action/deferral: Not indicated S/P hysterectomy  (09/04/2010)   Pap smear due: 06/11/2013    Mammogram: IMPRESSION:     No specific mammographic evidence of malignancy.                    ASSESSMENT: Negative - BI-RADS 1            (12/10/2009)   Mammogram action/deferral: Next screening  mammogram is recommended in one year.  (12/10/2009)   Mammogram due: 12/2010   Smoking status: never  (10/07/2010)  Diabetes Mellitus   HgbA1C: 10.0  (09/04/2010)   Hemoglobin A1C due: 12/10/2009    Eye exam: No diabetic retinopathy.  OU Visual acuity OD :      20/20 Visual acuity OS :     20/20 Intraocular pressure OD:   21   Intraocular pressure OS:   20     (05/01/2009)   Diabetic eye exam action/deferral: Ophthalmology referral  (10/07/2010)   Eye exam due: 05/2010    Foot exam: yes  (08/21/2010)   Foot exam action/deferral: Do today   High risk  foot: No  (08/21/2010)   Foot care education: Done  (12/30/2009)   Foot exam due: 06/12/2011    Urine microalbumin/creatinine ratio: 299.1  (06/19/2010)   Urine microalbumin action/deferral: Ordered   Urine microalbumin/cr due: 06/12/2011  Lipids   Total Cholesterol: 104  (06/19/2010)   Lipid panel action/deferral: Lipid Panel ordered   LDL: 51  (06/19/2010)   LDL Direct: Not documented   HDL: 38  (06/19/2010)   Triglycerides: 75  (06/19/2010)   Lipid panel due: 12/12/2010    SGOT (AST): 29  (11/20/2009)   SGPT (ALT): 26  (11/20/2009)   Alkaline phosphatase: 70  (11/20/2009)   Total bilirubin: 0.9  (11/20/2009)   Liver panel due: 06/12/2011  Hypertension   Last Blood Pressure: 109 / 81  (10/07/2010)   Serum creatinine: 0.72  (06/19/2010)   BMP action: Ordered   Serum potassium 3.9  (123456)   Basic metabolic panel due: 99991111  Self-Management Support :   Personal Goals (by the next clinic visit) :     Personal A1C goal: 7  (09/30/2009)  Personal blood pressure goal: 130/80  (06/13/2009)     Personal LDL goal: 70  (06/13/2009)    Patient will work on the following items until the next clinic visit to reach self-care goals:     Medications and monitoring: take my medicines every day, check my blood sugar, bring all of my medications to every visit, examine my feet every day  (10/07/2010)     Eating: drink diet soda or water instead of juice or soda, eat more vegetables, use fresh or frozen vegetables, eat foods that are low in salt, eat baked foods instead of fried foods, eat fruit for snacks and desserts, limit or avoid alcohol  (10/07/2010)     Activity: take a 30 minute walk every day, take the stairs instead of the elevator  (10/07/2010)     Other: bring meter to every visit  (12/30/2009)     Home glucose monitoring frequency: 2 times a day  (06/13/2009)    Diabetes self-management support: Resources for patients handout  (10/07/2010)   Last diabetes  self-management training by diabetes educator: 06/19/2010   Last medical nutrition therapy: 10/22/2008    Hypertension self-management support: Resources for patients handout  (10/07/2010)    Lipid self-management support: Resources for patients handout  (10/07/2010)        Resource handout printed.   Nursing Instructions: Refer for screening diabetic eye exam (see order)    Appended Document: ACUTE/MADERA/1 MONTH F/U VISIT/CH I discussed the patient with Dr. Valetta Close and I agree with the assessment and plan as outlined above.

## 2011-01-06 NOTE — Assessment & Plan Note (Signed)
Summary: HANDS/SB.   Vital Signs:  Patient profile:   55 year old female Height:      57 inches (144.78 cm) Weight:      176.2 pounds (80.09 kg) BMI:     38.27 Temp:     97.4 degrees F (36.33 degrees C) oral Pulse rate:   85 / minute BP sitting:   110 / 77  (right arm)  Vitals Entered By: Hilda Blades Ditzler RN (August 21, 2010 2:33 PM) Is Patient Diabetic? Yes Did you bring your meter with you today? No Pain Assessment Patient in pain? yes     Location: both hands Intensity: 8-9 Type: numbness Onset of pain  worse past 2-3 months Nutritional Status BMI of > 30 = obese Nutritional Status Detail appetite good CBG Result 332  Have you ever been in a relationship where you felt threatened, hurt or afraid?denies   Does patient need assistance? 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   Primary Care Provider:  Dr. Stanford Scotland   History of Present Illness: This is 55 year old female with PMH consitent with uncontrolled DM, HTN and HDL who presents with bilateral hand pain since 3 month.  Hand pain:  started  3 month ago, associated with numbness,  tingling and loss of strength in both hands.  No aggrevating  or relieving factors. She tried occasionaly ibuprofen which did not relieve the pain significantly. The worst  pain  are located in the last three fingers.     Depression History:      The patient denies a depressed mood most of the day and a diminished interest in her usual daily activities.         Preventive Screening-Counseling & Management  Alcohol-Tobacco     Smoking Status: never     Passive Smoke Exposure: no  Caffeine-Diet-Exercise     Does Patient Exercise: yes     Type of exercise: walking     Exercise (avg: min/session): 7:00     Times/week: 1  Current Medications (verified): 1)  Lisinopril 20 Mg Tabs (Lisinopril) .... Take 1 Tablet By Mouth Once A Day 2)   Hydrochlorothiazide 25 Mg Tabs (Hydrochlorothiazide) .... Take 1 Tablet By Mouth Once A Day 3)  Aspirin 81 Mg Chew (Aspirin) .... Take 1 Tablet By Mouth Once A Day 4)  Lipitor 20 Mg Tabs (Atorvastatin Calcium) .... Take 1 Tablet By Mouth Once A Day 5)  Nexium 40 Mg  Cpdr (Esomeprazole Magnesium) .... Take 1 Capsule By Mouth Twice A Day 6)  Metformin Hcl 1000 Mg  Tabs (Metformin Hcl) .... Take 1 Pill Twice Daily With Food At Breakfast and Evening Meal 7)  Lantus 100 Unit/ml Soln (Insulin Glargine) .... Inject 25 Units Subcutaneouslyin The Morning When You Wake Up 8)  Lancets  Misc (Lancets) .... Use For Two Times A Day Monitoring of Blood Glucose. 9)  Novolog Flexpen 100 Unit/ml Soln (Insulin Aspart) .... Inject 6 Units and Self-Titrate Dose To Achieve Target Goal; 10 Minutes Before Each Meal Three Times A Day. (If You Skip A Meal, Skip The Injection) 10)  Relion Mini Pen Needles 31g X 6 Mm Misc (Insulin Pen Needle) .... Use To Take Insulin Injections Three Times A Day Before Meals 11)  Truetrack Test  Strp (Glucose Blood) .... Use To Test Blood Sugar 4x A Day Before Meals and Bedtime 12)  Lancets  Misc (Lancets) .... Use To Check Blood  Sugar 4x Daily 13)  Neurontin 300 Mg Caps (Gabapentin) .... 1. [id number] One Tablet By Mouth Once A Day. 2. Day: Take One Tablet By Mouth Two Times A Day  3. Day: Take One Tablet By Mouth Three Times A Day and Then Continue On 3.  Allergies: 1)  ! Elavil  Review of Systems  The patient denies fever, weight gain, chest pain, dyspnea on exertion, abdominal pain, and muscle weakness.    Physical Exam  General:  Well-developed,well-nourished,in no acute distress; alert,appropriate and cooperative throughout examination Neck:  No deformities, masses, or tenderness noted. Lungs:  Normal respiratory effort, chest expands symmetrically. Lungs are clear to auscultation, no crackles or wheezes. Heart:  Normal rate and regular rhythm. S1 and S2 normal without gallop,  murmur, click, rub or other extra sounds. Abdomen:  Bowel sounds positive,abdomen soft and non-tender without masses, organomegaly or hernias noted. Msk:  hands: decreased strength right> left, decreased range of motion in the wrist because of pain, but normal ROM in the ellbow and shoulde, no joint tenderness, no joint swelling, no joint warmth, no redness over joints, no joint deformities, no joint instability, and no crepitation.   Pulses:  R and L carotid,radial,dorsalis pedis and posterior tibial pulses are full and equal bilaterally Extremities:  No clubbing, cyanosis, edema, or deformity noted with normal full range of motion of all joints.   Neurologic:  No cranial nerve deficits noted. Station and gait are normal. Plantar reflexes are down-going bilaterally. DTRs are symmetrical throughout. Sensory, motor and coordinative functions appear intact. Cervical Nodes:  No lymphadenopathy noted Psych:  Cognition and judgment appear intact. Alert and cooperative with normal attention span and concentration. No apparent delusions, illusions, hallucinations  Diabetes Management Exam:    Foot Exam (with socks and/or shoes not present):       Sensory-Monofilament:          Left foot: normal          Right foot: normal   Impression & Recommendations:  Problem # 1:  HAND PAIN, BILATERAL (ICD-729.5) Patient presented with bilateral hand pain which has been present since 3 wks. DD is broad including Thyroid dysfunction, RA , nerve compression or Vitamine B12 deficiency. Will check today Vit B12, TSH, ESR, ANA, Rheumatoid Factor and RPR titer. Furthermore patient was referred for a nerve conduction study. Patient was started on Gabapentin. Consider to change management according to Lab and study result.   Orders: Pain Clinic Referral (Pain) T-TSH [id number]) T-Vitamin B12 [id number]) T-Sed Rate (Automated) [id number]) T-Antinuclear Antib (ANA) [phone]) T-Rheumatoid Factor  [phone] Orders: T-RPR (Syphilis) [id number]) ... 08/22/2010 T- * Misc. Laboratory test [id number]) ... 08/22/2010  Complete Medication List: 1)  Lisinopril 20 Mg Tabs (Lisinopril) .... Take 1 tablet by mouth once a day 2)  Hydrochlorothiazide 25 Mg Tabs (Hydrochlorothiazide) .... Take 1 tablet by mouth once a day 3)  Aspirin 81 Mg Chew (Aspirin) .... Take 1 tablet by mouth once a day 4)  Lipitor 20 Mg Tabs (Atorvastatin calcium) .... Take 1 tablet by mouth once a day 5)  Nexium 40 Mg Cpdr (Esomeprazole magnesium) .... Take 1 capsule by mouth twice a day 6)  Metformin Hcl 1000 Mg Tabs (Metformin hcl) .... Take 1 pill twice daily with food at breakfast and evening meal 7)  Lantus 100 Unit/ml Soln (Insulin glargine) .... Inject 25 units subcutaneouslyin the morning when you wake up 8)  Lancets Misc (Lancets) .... Use for two times a day monitoring of blood glucose. 9)  Novolog  Flexpen 100 Unit/ml Soln (Insulin aspart) .... Inject 6 units and self-titrate dose to achieve target goal; 10 minutes before each meal three times a day. (if you skip a meal, skip the injection) 10)  Relion Mini Pen Needles 31g X 6 Mm Misc (Insulin pen needle) .... Use to take insulin injections three times a day before meals 11)  Truetrack Test Strp (Glucose blood) .... Use to test blood sugar 4x a day before meals and bedtime 12)  Lancets Misc (Lancets) .... Use to check blood sugar 4x daily 13)  Neurontin 300 Mg Caps (Gabapentin) .... 1. [id number] one tablet by mouth once a day. 2. day: take one tablet by mouth two times a day  3. day: take one tablet by mouth three times a day and then continue on 3. 14)  Mobic 15 Mg Tabs (Meloxicam) .... Take one tablet by mouth once a day  Other Orders: Capillary Blood Glucose/CBG [id number]) Influenza Vaccine NON MCR [id number])  Patient Instructions: 1)  Please schedule a follow-up appointment in 3 weeks with Dr Dyann Kief.  2)  Check your blood sugars regularly. If your readings  are usually above : or below 70 you should contact our office. Prescriptions: MOBIC 15 MG TABS (MELOXICAM) Take one tablet by mouth once a day  #30 x 0   Entered and Authorized by:   Rosalia Hammers MD   Signed by:   Rosalia Hammers MD on 08/26/2010   Method used:   Electronically to        [facility] [phone]* (retail)       [place], [place]  [address]       Ph: [phone]       Fax: [phone]   [id number] NEURONTIN 300 MG CAPS (GABAPENTIN) 1. Day:Take one tablet by mouth once a day. 2. Day: Take one tablet by mouth two times a day  3. Day: Take one tablet by mouth three times a day and then continue on 3.  #90 x 1   Entered and Authorized by:   Rosalia Hammers MD   Signed by:   Rosalia Hammers MD on 08/21/2010   Method used:   Print then Give to Patient   RxID:   [id number]  Process Orders Check Orders Results:     [facility]: [id number] not required for this insurance Tests Sent for requisitioning (September 01, 2010 2:58 PM):     08/22/2010: [facility] -- T-RPR (Syphilis) [phone] (signed)     08/22/2010: [place]. Laboratory test [id number] (signed)     08/21/2010: [facility] -- T-TSH [phone] (signed)     08/21/2010: [facility] -- T-Vitamin B12 [phone] (signed)     08/21/2010: [facility] -- T-Sed Rate (Automated) [phone] (signed)     08/21/2010: [facility] -- T-Antinuclear Antib (ANA) [id number] (signed)     08/21/2010: [facility] -- T-Rheumatoid Factor [phone] (signed)     Prevention & Chronic Care Immunizations   Influenza vaccine: Fluvax Non-MCR  (08/21/2010)   Influenza vaccine deferral: Deferred  (05/24/2009)   Influenza vaccine due: 08/07/2009    Tetanus booster: Not documented   Td booster deferral: Deferred  (05/24/2009)    Pneumococcal vaccine:  Not documented   Pneumococcal vaccine deferral: Deferred  (05/24/2009)  Colorectal Screening   Hemoccult: Not documented   Hemoccult action/deferral: Ordered  (06/11/2010)   Hemoccult due: 05/24/2010  Colonoscopy: Location:  [place].    (12/18/2008)   Colonoscopy action/deferral: Deferred  (05/24/2009)   Colonoscopy due: 12/18/2018  Other Screening   Pap smear: Not documented   Pap smear action/deferral: Ordered  (06/11/2010)   Pap smear due: 06/11/2013    Mammogram: IMPRESSION:     No specific mammographic evidence of malignancy.                    ASSESSMENT: Negative - BI-RADS 1            (12/10/2009)   Mammogram action/deferral: Next screening  mammogram is recommended in one year.  (12/10/2009)   Mammogram due: 12/2010   Smoking status: never  (08/21/2010)  Diabetes Mellitus   HgbA1C: 8.8  (06/11/2010)   Hemoglobin A1C due: 12/10/2009    Eye exam: No diabetic retinopathy.  OU Visual acuity OD :      20/20 Visual acuity OS :     20/20 Intraocular pressure OD:   21   Intraocular pressure OS:   20     (05/01/2009)   Diabetic eye exam action/deferral: Ophthalmology referral  (06/11/2010)   Eye exam due: 05/2010    Foot exam: yes  (08/21/2010)   Foot exam action/deferral: Do today   High risk foot: No  (08/21/2010)   Foot care education: Done  (12/30/2009)   Foot exam due: 06/12/2011    Urine microalbumin/creatinine ratio: 299.1  (06/19/2010)   Urine microalbumin action/deferral: Ordered   Urine microalbumin/cr due: 06/12/2011  Lipids   Total Cholesterol: 104  (06/19/2010)   Lipid panel action/deferral: Lipid Panel ordered   LDL: 51  (06/19/2010)   LDL Direct: Not documented   HDL: 38  (06/19/2010)   Triglycerides: 75  (06/19/2010)   Lipid panel due: 12/12/2010    SGOT (AST): 29  (11/20/2009)   SGPT (ALT): 26  (11/20/2009)   Alkaline phosphatase: 70  (11/20/2009)   Total bilirubin: 0.9  (11/20/2009)   Liver panel due:  06/12/2011  Hypertension   Last Blood Pressure: 110 / 77  (08/21/2010)   Serum creatinine: 0.72  (06/19/2010)   BMP action: Ordered   Serum potassium 3.9  (123456)   Basic metabolic panel due: 99991111  Self-Management Support :   Personal Goals (by the next clinic visit) :     Personal A1C goal: 7  (09/30/2009)     Personal blood pressure goal: 130/80  (06/13/2009)     Personal LDL goal: 70  (06/13/2009)    Patient will work on the following items until the next clinic visit to reach self-care goals:     Medications and monitoring: take my medicines every day, check my blood sugar, bring all of my medications to every visit, examine my feet every day  (08/21/2010)     Eating: drink diet soda or water instead of juice or soda, eat more vegetables, use fresh or frozen vegetables, eat fruit for snacks and desserts  (08/21/2010)     Activity: take a 30 minute walk every day, park at the far end of the parking lot  (08/21/2010)     Other: bring meter to every visit  (12/30/2009)     Home glucose monitoring frequency: 2 times a day  (06/13/2009)    Diabetes self-management support: Written self-care plan, Education handout, Resources for patients handout  (08/21/2010)   Diabetes care plan printed   Diabetes education handout printed   Last diabetes self-management training by diabetes educator: 06/19/2010   Last medical nutrition  therapy: 10/22/2008    Hypertension self-management support: Written self-care plan, Education handout, Resources for patients handout  (08/21/2010)   Hypertension self-care plan printed.   Hypertension education handout printed    Lipid self-management support: Written self-care plan, Education handout, Resources for patients handout  (08/21/2010)   Lipid self-care plan printed.   Lipid education handout printed      Resource handout printed.   Influenza Vaccine    Vaccine Type: Fluvax Non-MCR    Site: left deltoid    Mfr: GlaxoSmithKline     Dose: 0.5 ml    Route: IM    Given by: Hilda Blades Ditzler RN    Exp. Date: 06/06/2011    Lot #: [id number]    VIS given: 07/01/10 version given August 21, 2010.  Flu Vaccine Consent Questions    Do you have a history of severe allergic reactions to this vaccine? no    Any prior history of allergic reactions to egg and/or gelatin? no    Do you have a sensitivity to the preservative Thimersol? no    Do you have a past history of Guillan-Barre Syndrome? no    Do you currently have an acute febrile illness? no    Have you ever had a severe reaction to latex? no    Vaccine information given and explained to patient? yes    Are you currently pregnant? no   Last LDL:                                                 51 (06/19/2010 11:53:00 PM)          Diabetic Foot Exam Foot Inspection Is there a history of a foot ulcer?              No Is there a foot ulcer now?              No Can the patient see the bottom of their feet?          Yes Are the shoes appropriate in style and fit?          Yes Is there swelling or an abnormal foot shape?          No Are the toenails long?                No Are the toenails thick?                No Are the toenails ingrown?              No Is there heavy callous build-up?              No Is there a claw toe deformity?                          No Is there elevated skin temperature?            No Is there limited ankle dorsiflexion?            No Is there foot or ankle muscle weakness?            No Do you have pain in calf while walking?           No      Pulse Check  Right Foot          Left Foot Posterior Tibial:        1+            2+ Dorsalis Pedis:        2+            2+  High Risk Feet? No   10-g (5.07) Semmes-Weinstein Monofilament Test Performed by: Hilda Blades Ditzler RN          Right Foot          Left Foot Visual Inspection     normal         normal Test Control      normal         normal Site 1         normal          normal Site 2         normal         normal Site 3         normal         normal Site 4         normal         normal Site 5         normal         normal Site 6         normal         normal Site 7         normal         normal Site 8         normal         normal Site 9         normal         normal Site 10         normal         normal  Impression      normal         normal   Process Orders Check Orders Results:     [facility]: [id number] not required for this insurance Tests Sent for requisitioning (September 01, 2010 2:58 PM):     08/22/2010: [facility] -- T-RPR (Syphilis) [phone] (signed)     08/22/2010: [place]. Laboratory test [id number] (signed)     08/21/2010: [facility] -- T-TSH [phone] (signed)     08/21/2010: [facility] -- T-Vitamin B12 [id number] (signed)     08/21/2010: [facility] -- T-Sed Rate (Automated) [id number] (signed)     08/21/2010: [facility] -- T-Antinuclear Antib (ANA) [id number] (signed)     08/21/2010: [facility] -- T-Rheumatoid Factor [phone] (signed)

## 2011-01-06 NOTE — Assessment & Plan Note (Signed)
Summary: f/u myoview done 11-13-09 / ok per anne l.   Visit Type:  Follow-up Primary Provider:  Barton Dubois MD  CC:  follow stress test -pt states she has had chest discomfort but it is much better than it was the last visit.Marland Kitchen  History of Present Illness: 55 yo with history of diabetes, HTN, hyperlipidemia returns for evaluation of chest pain.  She had had atypical chest pain possibly consistent with costochondritis.  After last appointment, she had an ETT-myoview that was low risk with probable breast attenuation but no ischemia.  EF was 76%.  Since then, she has had occasional twinges of chest pain but overall symptoms are better.  Her chest is less tender.   Labs (4/10): TG 154, HDL 34, LDL 46 Labs (10/10): creatinine 0.85 Labs (12/10): LDL 66, HDL 34  Current Medications (verified): 1)  Lisinopril 20 Mg Tabs (Lisinopril) .... Take 1 Tablet By Mouth Once A Day 2)  Hydrochlorothiazide 25 Mg Tabs (Hydrochlorothiazide) .... Take 1 Tablet By Mouth Once A Day 3)  Aspirin 81 Mg Chew (Aspirin) .... Take 1 Tablet By Mouth Once A Day 4)  Lipitor 20 Mg Tabs (Atorvastatin Calcium) .... Take 1 Tablet By Mouth Once A Day 5)  Nexium 40 Mg  Cpdr (Esomeprazole Magnesium) .... Take 1 Capsule By Mouth Twice A Day 6)  Metformin Hcl 1000 Mg  Tabs (Metformin Hcl) .... Take 1 Pill Twice Daily With Food At Breakfast and Evening Meal 7)  Lantus 100 Unit/ml Soln (Insulin Glargine) .... Inject 50 Units Subcutaneously At Bedtime Daily 8)  Lancets  Misc (Lancets) .... Use For Two Times A Day Monitoring of Blood Glucose. 9)  Ibuprofen 600 Mg Tabs (Ibuprofen) .... Take 1 Tab Every 6 Hrs As Needed For Chest Pain For Up To 2 Weeks.  Allergies (verified): 1)  ! Elavil  Past History:  Past Medical History: 1. Diabetes mellitus, type II 2. Hyperlipidemia 3. Hypertension 4. h/o chest pain: negative cardiolite September 2007.  ETT-myoview 12/10 with EF 76%, no ischemia, probable breast attenuation.  Episode chest  pain in 12/10 probably costochondritis.                                                                5. GERD: H/o H. pylori positive treated with Prev pac 11/07 6. H/o Endometriosis  Family History: Reviewed history from 11/05/2009 and no changes required. Mother and grandfather had diabetes.  Several family members have HTN and have had cancer of unknown type.  no known colon cancer.  She thinks her grandfather had an MI.   Social History: Reviewed history from 11/05/2009 and no changes required. She has trouble financially which leads to trying to make meds last longer by decreasing frequency of doses. Not married.  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   Vital Signs:  Patient profile:   55 year old female Height:      57 inches Weight:      177 pounds BMI:     38.44 Pulse rate:   75 / minute BP sitting:   136 / 81  (left arm) Cuff size:   large  Vitals Entered By: Lubertha Basque, CNA (January 06, 2010 11:38 AM)  Physical Exam  General:  Well developed, well  nourished, in no acute distress.  Obese.  Neck:  Neck supple, no JVD. No masses, thyromegaly or abnormal cervical nodes. Chest Wall:  Tender to palpation over lower sternum, but less than prior.  Lungs:  Clear bilaterally to auscultation and percussion. Heart:  Non-displaced PMI, chest non-tender; regular rate and rhythm, S1, S2 without murmurs, rubs or gallops. Carotid upstroke normal, no bruit.  Pedals normal pulses. No edema, no varicosities. Abdomen:  Bowel sounds positive; abdomen soft and non-tender without masses, organomegaly, or hernias noted. No hepatosplenomegaly. Extremities:  No clubbing or cyanosis. Neurologic:  Alert and oriented x 3. Psych:  Normal affect.   Impression & Recommendations:  Problem # 1:  CHEST PAIN (ICD-786.50) Very atypical chest pain.  Pain may have been costochondritis (she was tender to palpation over the lower sternum).  ETT myoview was low risk with no  ischemia.  She can decrease her ASA to 81 mg daily.   Problem # 2:  HYPERTENSION (ICD-401.9) BP is at goal.   Patient Instructions: 1)  Your physician has recommended you make the following change in your medication:  2)  Decrease Aspirin to 81mg  daily 3)  Your physician recommends that you schedule a follow-up appointment as needed with Dr. Loralie Champagne

## 2011-01-06 NOTE — Assessment & Plan Note (Signed)
Summary: BILATERAL HAND PAIN/STEROID INJ/NP/LP   Vital Signs:  Patient profile:   55 year old female Pulse rate:   83 / minute BP sitting:   135 / 88  (right arm)  Vitals Entered By: Caesar Chestnut RN (September 26, 2010 9:46 AM) CC: bilat hand pain, numbness   Primary Care Provider:  Dr. Stanford Scotland  CC:  bilat hand pain and numbness.  History of Present Illness: B hand numbness, pain and tingling for months to years that is worsening to the point it is interfering with her job--having difficulty grasping things. Has been tried on gabapentin but only up to 300 mg a day--had no side effects from it  Current Medications (verified): 1)  Lisinopril 20 Mg Tabs (Lisinopril) .... Take 1 Tablet By Mouth Once A Day 2)  Hydrochlorothiazide 25 Mg Tabs (Hydrochlorothiazide) .... Take 1 Tablet By Mouth Once A Day 3)  Aspirin 81 Mg Chew (Aspirin) .... Take 1 Tablet By Mouth Once A Day 4)  Lipitor 20 Mg Tabs (Atorvastatin Calcium) .... Take 1 Tablet By Mouth Once A Day 5)  Nexium 40 Mg  Cpdr (Esomeprazole Magnesium) .... Take 1 Capsule By Mouth Twice A Day 6)  Metformin Hcl 1000 Mg  Tabs (Metformin Hcl) .... Take 1 Pill Twice Daily With Food At Breakfast and Evening Meal 7)  Lantus 100 Unit/ml Soln (Insulin Glargine) .... Inject 30 Units Subcutaneouslyin The Morning When You Wake Up 8)  Lancets  Misc (Lancets) .... Use For Two Times A Day Monitoring of Blood Glucose. 9)  Novolog Flexpen 100 Unit/ml Soln (Insulin Aspart) .... Inject 6 Units and Self-Titrate Dose To Achieve Target Goal; 10 Minutes Before Each Meal Three Times A Day. (If You Skip A Meal, Skip The Injection) 10)  Relion Mini Pen Needles 31g X 6 Mm Misc (Insulin Pen Needle) .... Use To Take Insulin Injections Three Times A Day Before Meals 11)  Truetrack Test  Strp (Glucose Blood) .... Use To Test Blood Sugar 4x A Day Before Meals and Bedtime 12)  Lancets  Misc (Lancets) .... Use To Check Blood Sugar 4x Daily 13)  Amitriptyline Hcl 50 Mg Tabs  (Amitriptyline Hcl) .... Take 1 Tab By Mouth At Bedtime 14)  Neurontin 300 Mg Caps (Gabapentin) .... Taper Up To  Three At Night and Two in Am (Patient Has Written Taper)  Allergies (verified): 1)  ! Elavil  Past History:  Past Medical History: Last updated: 01/06/2010 1. Diabetes mellitus, type II 2. Hyperlipidemia 3. Hypertension 4. h/o chest pain: negative cardiolite September 2007.  ETT-myoview 12/10 with EF 76%, no ischemia, probable breast attenuation.  Episode chest pain in 12/10 probably costochondritis.                                                                5. GERD: H/o H. pylori positive treated with Prev pac 11/07 6. H/o Endometriosis  Past Surgical History: Last updated: 11/12/2008 Hysterectomy sec to DUB     Family History: Last updated: 11/05/2009 Mother and grandfather had diabetes.  Several family members have HTN and have had cancer of unknown type.  no known colon cancer.  She thinks her grandfather had an MI.   Social History: Last updated: 09/04/2010 Not married.   Works as a Careers information officer, doing  nursing duties.  -bath patients, change linen, clean, transport services (pt weighs greater than 200 lbs), moving bed around to clean -basic hygeine assistance  - has been working in this industry since the 1970s Never smoked.  Homeless - lives with friends and mother   Risk Factors: Alcohol Use: 0 (09/04/2010) Exercise: yes (09/04/2010)  Review of Systems  The patient denies fever, weight loss, weight gain, and peripheral edema.    Physical Exam  General:  alert, well-developed, well-nourished, and well-hydrated.   Msk:  B hands no deformity, no thenar atrophy. FROm all finger joints. Flex /ext of wrist normal ROM and full strength. Decreased gripstrength and decreased fine motor coordination / fluidity of all fingers/thumbs. Sensation decreased to soft touch sensation in all aspects of dorsum and ventral hand, fingertips. Pulses:  radial pulse  2+ b symmetrical Neurologic:  alert & oriented X3 and gait normal.   Additional Exam:  REVIEW from chart: A1C is 10. thyroid in Sept was normal, B 12 normal.   Impression & Recommendations:  Problem # 1:  PERIPHERAL NEUROPATHY (ICD-356.9)  likely multifactorial, diabetic neuropathy, maybe some carpal tunnel, perhaps some other causes of peripheral neuropathy. She has been wearing cock up splints for quite a while. Will restart gabapentin and taper up fairly rapidy to 1800 mg dose. We did right wrist injection today. Would consider  nerve conduction studies if we can get that scheduled--(consider thr Dr Letta Pate as she has no insurance) rtc 2 weeks if she had good effect from shot or in 6 weeks if the shot did not help (would not do the other wrist) and we can see how the gabapentin does.  Orders: Joint Aspirate / Injection, Intermediate [id number]) Kenalog 10 mg inj ([id number])  Complete Medication List: 1)  Lisinopril 20 Mg Tabs (Lisinopril) .... Take 1 tablet by mouth once a day 2)  Hydrochlorothiazide 25 Mg Tabs (Hydrochlorothiazide) .... Take 1 tablet by mouth once a day 3)  Aspirin 81 Mg Chew (Aspirin) .... Take 1 tablet by mouth once a day 4)  Lipitor 20 Mg Tabs (Atorvastatin calcium) .... Take 1 tablet by mouth once a day 5)  Nexium 40 Mg Cpdr (Esomeprazole magnesium) .... Take 1 capsule by mouth twice a day 6)  Metformin Hcl 1000 Mg Tabs (Metformin hcl) .... Take 1 pill twice daily with food at breakfast and evening meal 7)  Lantus 100 Unit/ml Soln (Insulin glargine) .... Inject 30 units subcutaneouslyin the morning when you wake up 8)  Lancets Misc (Lancets) .... Use for two times a day monitoring of blood glucose. 9)  Novolog Flexpen 100 Unit/ml Soln (Insulin aspart) .... Inject 6 units and self-titrate dose to achieve target goal; 10 minutes before each meal three times a day. (if you skip a meal, skip the injection) 10)  Relion Mini Pen Needles 31g X 6 Mm Misc (Insulin pen needle) ....  Use to take insulin injections three times a day before meals 11)  Truetrack Test Strp (Glucose blood) .... Use to test blood sugar 4x a day before meals and bedtime 12)  Lancets Misc (Lancets) .... Use to check blood sugar 4x daily 13)  Amitriptyline Hcl 50 Mg Tabs (Amitriptyline hcl) .... Take 1 tab by mouth at bedtime 14)  Neurontin 300 Mg Caps (Gabapentin) .... Taper up to  three at night and two in am (patient has written taper)  Patient Instructions: 1)  start the neurontin (gabapenting) with  2)  one tab at night for a week, 3)  then move up to two tabs at nigt for a week. 4)   On the  third week start one tab in the morning and two tabs at night--do that for a week  5)  and then move up to 2 in morning and two at night.  6)  Then move up to three tabs at night and two in am and see me a week or so after that. Prescriptions: NEURONTIN 300 MG CAPS (GABAPENTIN) taper up to  three at night and two in am (patient has written taper)  #1501 x 2   Entered and Authorized by:   Dorcas Mcmurray MD   Signed by:   Dorcas Mcmurray MD on 09/26/2010   Method used:   Electronically to        [facility] [phone]* (retail)       [place], [place]  [address]       Ph: [phone]       Fax: [phone]   [id number]    Orders Added: 1)  Est. Patient Level IV [id number] 2)  Joint Aspirate / Injection, Intermediate [id number] 3)  Kenalog 10 mg inj [id number]

## 2011-01-06 NOTE — Assessment & Plan Note (Signed)
Summary: EST-ROUTINE CHECKUP/CH   Vital Signs:  Patient profile:   55 year old female Height:      57 inches (144.78 cm) Weight:      172.6 pounds (78.45 kg) BMI:     37.49 Temp:     97.2 degrees F (36.22 degrees C) Pulse rate:   85 / minute BP sitting:   124 / 89  (left arm) Cuff size:   large  Vitals Entered By: Nadine Counts Deborra Medina) (December 30, 2009 2:04 PM) Is Patient Diabetic? Yes Did you bring your meter with you today? No Pain Assessment Patient in pain? no      Nutritional Status BMI of > 30 = obese CBG Result 292  Have you ever been in a relationship where you felt threatened, hurt or afraid?No   Does patient need assistance? Functional Status Self care Ambulation Normal   Primary Care Provider:  Barton Dubois MD   History of Present Illness: 55 y/o woman with pmh significant for HTN, DM, GERD and HLD. Who come to the clinic for routine followup. Pt reports that her chest pain is pretty much gone, but that sometimes whe she is under a lot of stress she experienced some discomfort. Ibuprofen has helped some, but unfortunely is worsening her reflux.  Pt denies any CP today, no abdominal pain, nausea, vomiting or any other discomfort.  She rports to be compliant with her medications, but admitted that she has been using metformin once a day instead of two times a day in order to make the medication last a little bit longer (she is not getting 3 months supply at the pharmacy as she was supposed to); she also endorses been following an high carbohydratesand also greasy diet and having not good portion control.   Pt's CBG's has been in the 250-300;s range and her A1C is 12.2 now.  BP is at goal.  Preventive Screening-Counseling & Management  Alcohol-Tobacco     Smoking Status: never     Passive Smoke Exposure: no  Problems Prior to Update: 1)  Chest Pain  (ICD-786.50) 2)  Hyperlipidemia  (ICD-272.4) 3)  Hypertension  (ICD-401.9) 4)  Sx of Nausea and  Vomiting  (ICD-787.01) 5)  Diabetes Mellitus, Type II  (ICD-250.00) 6)  Gerd  (ICD-787.02) 7)  Fecal Occult Blood  (ICD-792.1) 8)  Guaiac Positive Stool  (ICD-578.1) 9)  Shoulder Pain, Left  (ICD-719.41) 10)  Pain in Joint, Hand  (ICD-719.44) 11)  Wrist Pain, Left  (ICD-719.43) 12)  Heat Edema  ([id number]) 13)  Hx of Helicobacter Pylori Infection  (ICD-041.86)  Current Problems (verified): 1)  Chest Pain  (ICD-786.50) 2)  Hyperlipidemia  (ICD-272.4) 3)  Hypertension  (ICD-401.9) 4)  Sx of Nausea and Vomiting  (ICD-787.01) 5)  Diabetes Mellitus, Type II  (ICD-250.00) 6)  Gerd  (ICD-787.02) 7)  Fecal Occult Blood  (ICD-792.1) 8)  Guaiac Positive Stool  (ICD-578.1) 9)  Shoulder Pain, Left  (ICD-719.41) 10)  Pain in Joint, Hand  (ICD-719.44) 11)  Wrist Pain, Left  (ICD-719.43) 12)  Heat Edema  ([id number]) 13)  Hx of Helicobacter Pylori Infection  (ICD-041.86)  Medications Prior to Update: 1)  Lisinopril 20 Mg Tabs (Lisinopril) .... Take 1 Tablet By Mouth Once A Day 2)  Hydrochlorothiazide 25 Mg Tabs (Hydrochlorothiazide) .... Take 1 Tablet By Mouth Once A Day 3)  Aspirin 81 Mg Chew (Aspirin) .... Take 1 Tablet By Mouth Once A Day 4)  Lipitor 20 Mg Tabs (Atorvastatin Calcium) .... Take 1 Tablet By  Mouth Once A Day 5)  Nexium 40 Mg  Cpdr (Esomeprazole Magnesium) .... Take 1 Capsule By Mouth Twice A Day 6)  Metformin Hcl 1000 Mg  Tabs (Metformin Hcl) .... Take 1 Pill Twice Daily With Food At Breakfast and Evening Meal 7)  Lantus 100 Unit/ml Soln (Insulin Glargine) .... Inject 50 Units Subcutaneously At Bedtime Daily 8)  Lancets  Misc (Lancets) .... Use For Two Times A Day Monitoring of Blood Glucose. 9)  Ibuprofen 600 Mg Tabs (Ibuprofen) .... Take 1 Tab Every 6 Hrs As Needed For Chest Pain For Up To 2 Weeks.  Current Medications (verified): 1)  Lisinopril 20 Mg Tabs (Lisinopril) .... Take 1 Tablet By Mouth Once A Day 2)  Hydrochlorothiazide 25 Mg Tabs (Hydrochlorothiazide) .... Take 1  Tablet By Mouth Once A Day 3)  Aspirin 81 Mg Chew (Aspirin) .... Take 1 Tablet By Mouth Once A Day 4)  Lipitor 20 Mg Tabs (Atorvastatin Calcium) .... Take 1 Tablet By Mouth Once A Day 5)  Nexium 40 Mg  Cpdr (Esomeprazole Magnesium) .... Take 1 Capsule By Mouth Twice A Day 6)  Metformin Hcl 1000 Mg  Tabs (Metformin Hcl) .... Take 1 Pill Twice Daily With Food At Breakfast and Evening Meal 7)  Lantus 100 Unit/ml Soln (Insulin Glargine) .... Inject 50 Units Subcutaneously At Bedtime Daily 8)  Lancets  Misc (Lancets) .... Use For Two Times A Day Monitoring of Blood Glucose. 9)  Ibuprofen 600 Mg Tabs (Ibuprofen) .... Take 1 Tab Every 6 Hrs As Needed For Chest Pain For Up To 2 Weeks.  Allergies: 1)  ! Elavil  Past History:  Past Medical History: Last updated: 11/05/2009 1. Diabetes mellitus, type II 2. Hyperlipidemia 3. Hypertension 4. h/o chest pain: negative cardiolite September 2007                                                                                                5. GERD: H/o H. pylori positive treated with Prev pac 11/07 6. H/o Endometriosis  Past Surgical History: Last updated: 11/12/2008 Hysterectomy sec to DUB     Family History: Last updated: 11/05/2009 Mother and grandfather had diabetes.  Several family members have HTN and have had cancer of unknown type.  no known colon cancer.  She thinks her grandfather had an MI.   Social History: Last updated: 11/05/2009 She has trouble financially which leads to trying to make meds last longer by decreasing frequency of doses. Not married.  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   Risk Factors: Exercise: yes (07/10/2009)  Risk Factors: Smoking Status: never (12/30/2009) Passive Smoke Exposure: no (12/30/2009)  Review of Systems       As per HPI.  Physical Exam  General:  alert, well-developed, well-nourished, and well-hydrated.   Lungs:  normal respiratory effort, no intercostal  retractions, no accessory muscle use, normal breath sounds, no crackles, and no wheezes.   Heart:  normal rate, regular rhythm, no murmur, no gallop, and no rub.   Abdomen:  soft, non-tender, normal bowel sounds, no guarding, no rigidity, and no rebound tenderness.  Extremities:  No clubbing, cyanosis, edema, or deformity noted with normal full range of motion of all joints.   Neurologic:  alert & oriented X3 and cranial nerves II-XII grossly intact.  sensation intact to light touch, gait normal, and finger-to-nose normal.    Diabetes Management Exam:    Foot Exam (with socks and/or shoes not present):       Sensory-Monofilament:          Left foot: normal          Right foot: normal   Impression & Recommendations:  Problem # 1:  HYPERTENSION (ICD-401.9) Well controlled and at goal for a patient with diabetes.Will continue current medication therapy. Pt advised to follow a low sodium diet and to continue exercising and losing weight.  Her updated medication list for this problem includes:    Lisinopril 20 Mg Tabs (Lisinopril) .Marland Kitchen... Take 1 tablet by mouth once a day    Hydrochlorothiazide 25 Mg Tabs (Hydrochlorothiazide) .Marland Kitchen... Take 1 tablet by mouth once a day  Orders: T-Basic Metabolic Panel ([id number])  Problem # 2:  HYPERLIPIDEMIA (P102836.4) Will continue lipitor and will advised patient to follow a low fat diet.   Her updated medication list for this problem includes:    Lipitor 20 Mg Tabs (Atorvastatin calcium) .Marland Kitchen... Take 1 tablet by mouth once a day  Problem # 3:  DIABETES MELLITUS, TYPE II (ICD-250.00) Will give prescription again to gurantee she use her metformin twice a day. Will also increased her lantus to 60units daily, divided in to doses 12 hrs apart. Patient will follow a low crab diet and also has been advised to lose weight and to exercise.  Her updated medication list for this problem includes:    Lisinopril 20 Mg Tabs (Lisinopril) .Marland Kitchen... Take 1 tablet by mouth  once a day    Aspirin 81 Mg Chew (Aspirin) .Marland Kitchen... Take 1 tablet by mouth once a day    Metformin Hcl 1000 Mg Tabs (Metformin hcl) .Marland Kitchen... Take 1 pill twice daily with food at breakfast and evening meal    Lantus 100 Unit/ml Soln (Insulin glargine) ..... Inject 30 units subcutaneously in the morning and 30 units at evening. (12 hours apart).  Orders: T-Hgb A1C (in-house) [id number]) T- Capillary Blood Glucose [id number])  Problem # 4:  GERD (ICD-787.02) Will continue nexium and will refresh lifestyle modification changes.  Complete Medication List: 1)  Lisinopril 20 Mg Tabs (Lisinopril) .... Take 1 tablet by mouth once a day 2)  Hydrochlorothiazide 25 Mg Tabs (Hydrochlorothiazide) .... Take 1 tablet by mouth once a day 3)  Aspirin 81 Mg Chew (Aspirin) .... Take 1 tablet by mouth once a day 4)  Lipitor 20 Mg Tabs (Atorvastatin calcium) .... Take 1 tablet by mouth once a day 5)  Nexium 40 Mg Cpdr (Esomeprazole magnesium) .... Take 1 capsule by mouth twice a day 6)  Metformin Hcl 1000 Mg Tabs (Metformin hcl) .... Take 1 pill twice daily with food at breakfast and evening meal 7)  Lantus 100 Unit/ml Soln (Insulin glargine) .... Inject 30 units subcutaneouslyin the morning and 30 units at evening. (12 hours apart). 8)  Lancets Misc (Lancets) .... Use for two times a day monitoring of blood glucose. 9)  Ibuprofen 600 Mg Tabs (Ibuprofen) .... Take 1 tab every 6 hrs as needed for chest pain for up to 2 weeks.  Patient Instructions: 1)  Please schedule a follow-up appointment in 2 months with me. 2)  Take medications as prescribed. 3)  Remember to  follow a low sodium diet and also to follow a low carb diet. 4)  Avoid foods high in acid (tomatoes, citrus juices, spicy foods). Avoid eating within two hours of lying down or before exercising. Do not over eat; try smaller more frequent meals. Elevate head of bed twelve inches when sleeping. 5)  Remember to come ready for pap smear during your next  visit. Prescriptions: METFORMIN HCL 1000 MG  TABS (METFORMIN HCL) take 1 pill twice daily with food at breakfast and evening meal  #180 x 5   Entered and Authorized by:   Barton Dubois MD   Signed by:   Barton Dubois MD on 12/30/2009   Method used:   Print then Give to Patient   RxID:   [id number]   Prevention & Chronic Care Immunizations   Influenza vaccine: Fluvax 3+  (09/09/2009)   Influenza vaccine deferral: Deferred  (05/24/2009)   Influenza vaccine due: 08/07/2009    Tetanus booster: Not documented   Td booster deferral: Deferred  (05/24/2009)    Pneumococcal vaccine: Not documented   Pneumococcal vaccine deferral: Deferred  (05/24/2009)  Colorectal Screening   Hemoccult: Not documented   Hemoccult action/deferral: Deferred  (05/24/2009)   Hemoccult due: 05/24/2010    Colonoscopy: Location:  [facility].    (12/18/2008)   Colonoscopy action/deferral: Deferred  (05/24/2009)   Colonoscopy due: 12/18/2018  Other Screening   Pap smear: Not documented   Pap smear due: 10/01/2009    Mammogram: IMPRESSION:     No specific mammographic evidence of malignancy.                    ASSESSMENT: Negative - BI-RADS 1            (12/10/2009)   Mammogram action/deferral: Next screening  mammogram is recommended in one year.  (12/10/2009)   Mammogram due: 12/2010   Smoking status: never  (12/30/2009)  Diabetes Mellitus   HgbA1C: 12.2  (12/30/2009)   Hemoglobin A1C due: 12/10/2009    Eye exam: No diabetic retinopathy.  OU Visual acuity OD :      20/20 Visual acuity OS :     20/20 Intraocular pressure OD:   21   Intraocular pressure OS:   20     (05/01/2009)   Eye exam due: 05/2010    Foot exam: yes  (12/30/2009)   Foot exam action/deferral: Do today   High risk foot: No  (12/30/2009)   Foot care education: Done  (12/30/2009)   Foot exam due: 10/11/2009    Urine microalbumin/creatinine ratio: 15.8  (06/13/2009)   Urine microalbumin  action/deferral: Ordered   Urine microalbumin/cr due: 06/13/2010    Diabetes flowsheet reviewed?: Yes   Progress toward A1C goal: Deteriorated  Lipids   Total Cholesterol: 121  (11/20/2009)   Lipid panel action/deferral: Deferred   LDL: 66  (11/20/2009)   LDL Direct: Not documented   HDL: 33.60  (11/20/2009)   Triglycerides: 106.0  (11/20/2009)   Lipid panel due: 03/21/2010    SGOT (AST): 29  (11/20/2009)   SGPT (ALT): 26  (11/20/2009)   Alkaline phosphatase: 70  (11/20/2009)   Total bilirubin: 0.9  (11/20/2009)    Lipid flowsheet reviewed?: Yes   Progress toward LDL goal: At goal  Hypertension   Last Blood Pressure: 124 / 89  (12/30/2009)   Serum creatinine: 0.85  (09/30/2009)   BMP action: Ordered   Serum potassium 3.8  (09/30/2009)    Hypertension flowsheet reviewed?: Yes   Progress  toward BP goal: At goal  Self-Management Support :   Personal Goals (by the next clinic visit) :     Personal A1C goal: 7  (09/30/2009)     Personal blood pressure goal: 130/80  (06/13/2009)     Personal LDL goal: 70  (06/13/2009)    Patient will work on the following items until the next clinic visit to reach self-care goals:     Medications and monitoring: take my medicines every day, check my blood sugar, bring all of my medications to every visit  (12/30/2009)     Eating: eat foods that are low in salt, eat baked foods instead of fried foods  (12/30/2009)     Activity: take a 30 minute walk every day  (12/30/2009)     Other: bring meter to every visit  (12/30/2009)     Home glucose monitoring frequency: 2 times a day  (06/13/2009)    Diabetes self-management support: Written self-care plan  (12/30/2009)   Diabetes care plan printed   Last diabetes self-management training by diabetes educator: 11/28/2007   Last medical nutrition therapy: 10/22/2008    Hypertension self-management support: Written self-care plan  (12/30/2009)   Hypertension self-care plan printed.    Lipid  self-management support: Written self-care plan  (12/30/2009)   Lipid self-care plan printed.   Nursing Instructions: Diabetic foot exam today    Diabetic Foot Exam Foot Inspection Is there a history of a foot ulcer?              No Is there a foot ulcer now?              No Can the patient see the bottom of their feet?          Yes Are the shoes appropriate in style and fit?          Yes Is there swelling or an abnormal foot shape?          No Are the toenails long?                No Are the toenails thick?                No Are the toenails ingrown?              No Is there heavy callous build-up?              No Is there pain in the calf muscle (Intermittent claudication) when walking?    No Diabetic Foot Care Education Patient educated on appropriate care of diabetic feet.   High Risk Feet? No   10-g (5.07) Semmes-Weinstein Monofilament Test Performed by: Maxine Glenn          Right Foot          Left Foot Site 1         normal         normal Site 4         normal         normal Site 5         normal         normal Site 6         normal         normal  Impression      normal         normal  Process Orders Check Orders Results:     [facility]: ABN not required for this insurance Tests Sent for  requisitioning (January 07, 2010 2:05 PM):     12/30/2009: [facility] -- T-Basic Metabolic Panel 0000000 (signed)    Laboratory Results   Blood Tests   Date/Time Received: December 30, 2009 2:23 PM Date/Time Reported: Maryan Rued  December 30, 2009 2:24 PM  HGBA1C: 12.2%   (Normal Range: Non-Diabetic - 3-6%   Control Diabetic - 6-8%) CBG Random:: 292mg /dL

## 2011-01-06 NOTE — Assessment & Plan Note (Signed)
Summary: DM TEACHING/CH   Allergies: 1)  ! Elavil   Complete Medication List: 1)  Lisinopril 20 Mg Tabs (Lisinopril) .... Take 1 tablet by mouth once a day 2)  Hydrochlorothiazide 25 Mg Tabs (Hydrochlorothiazide) .... Take 1 tablet by mouth once a day 3)  Aspirin 81 Mg Chew (Aspirin) .... Take 1 tablet by mouth once a day 4)  Lipitor 20 Mg Tabs (Atorvastatin calcium) .... Take 1 tablet by mouth once a day 5)  Nexium 40 Mg Cpdr (Esomeprazole magnesium) .... Take 1 capsule by mouth twice a day 6)  Metformin Hcl 1000 Mg Tabs (Metformin hcl) .... Take 1 pill twice daily with food at breakfast and evening meal 7)  Lantus 100 Unit/ml Soln (Insulin glargine) .... Inject 25 units subcutaneouslyin the morning when you wake up 8)  Lancets Misc (Lancets) .... Use for two times a day monitoring of blood glucose. 9)  Novolog Flexpen 100 Unit/ml Soln (Insulin aspart) .... Inject 6 units 10 minutes before each meal three times a day, if you skip a meal, skip the injection 10)  Relion Mini Pen Needles 31g X 6 Mm Misc (Insulin pen needle) .... Use to take insulin injections three times a day before meals 11)  Truetrack Test Strp (Glucose blood) .... Use to test blood sugar 4x a day before meals and bedtime 12)  Lancets Misc (Lancets) .... Use to check blood sugar 4x daily  Other Orders: DSMT(Medicare) Individual, 30 Minutes (G0108)  Patient Instructions: 1)  Do not take your evening dose of lantus tonight 2)  You can take 6 units of Novolog tonight before dinner 3)  Tomorrow take 25 units of lantus when you wake up 4)  then take 6 units Novolog 10 minutes before breakfast, lunch and supper 5)  If you skip a meal, skip the Novolg injection 6)  make a follow up with Butch Penny in 3 weeks 7)  please complete blood sugar testing sheet for 2 days before you come back to see me.   Diabetes Self Management Training  PCP: Barton Dubois MD Referring MD: Loralie Champagne, MD Date diagnosed with diabetes:  12/07/2002 Diabetes Type: Type 2 insulin treated Other persons present: no Current smoking Status: never  Diabetes Medications:  Lipid lowering Meds? Yes Anti-platelet Meds? No Herbs or Supplements: No Comments: patient brought CBg record completely filled out- when I questioned her how she had tested so many times today already- she indicated that maybe she hadn;t tested as many times as were on hte sheet and got carried away, but nonethe less she reprots blood sugars always high and in the 300s except at bedtime when they are inthe 200s. will scan log into chart. she reports that she had been running short of insulin and was omitting her PM dose quite often. She also tends ot fall asleep in PM. agreed after explaination today to chang eot basal bolus therapy. Suggest decreased  basal and bolus doses form what calcualted/recommended doses are as I am not sure exactly how much insulin patient has been taking but she assures me that she at least takes 30 units a day and CBgs still in 200-300s.    Insulin to Carb Ratio: 1 unit:10g carb Meals Coverage: suggest 6 units a day consistent dose with consistent carb meals which was explained to patient today. expect she may need more ( ~8 units) per meal but will have her keep logs and suggest adding correction or increase set doses as needed based on 2 day logs.  Monitoring Self monitoring blood glucose 3 times a day Name of Meter  Freestyle Measures urine ketones? No  Recent Episodes of: Requiring Help from another person  Hyperglycemia : Yes Hypoglycemia: Yes   Wears Medical I.D. No Carrys Food for Low Blood sugar Yes Can you tell if your blood sugar is low? Yes   Estimated /Usual Carb Intake Breakfast # of Carbs/Grams bacon and cereal, or biscuit Lunch # of Carbs/Grams sandwich, chips- 1 small bag, or chicken an dbread or spaghetti Dinner # of Carbs/Grams vegetable, beans, rice or potatoes, fish, chicken Bedtime # of Carbs/Grams  fwatermelon if they have it Diabetes Disease Process  Discussed today Define diabetes in simple terms: Needs review/assistance    Medications State name-action-dose-duration-side effects-and time to take medication: Demonstrates competency   State appropriate timing of food related to medication: Demonstrates competency   Demonstrates/verbalizes site selection and rotation for injections Demonstrates competency   Correctly draw up and administer insulin-[place]-Symlin-glucagon: Demonstrates competency   Describe safe needle/lancet disposal: Demonstrates competency    Nutritional Management Identify what foods most often affect blood glucose: Demonstrates competency    Verbalize importance of controlling food portions: Demonstrates competency   State changes planned for home meals/snacks: Needs review/assistance    Monitoring State purpose and frequency of monitoring BG-ketones-HgbA1C  : Needs review/assistance   Perform glucose monitoring/ketone testing and record results correctly: Demonstrates competencyState target blood glucose and HgbA1C goals: Needs review/assistance   Diabetes Management Education Done: 06/19/2010    BEHAVIORAL GOALS INITIAL Utilizing medications if for therapeutic effectiveness: chnge to new insulin as per directions Incorporating appropriate nutritional management: be sure to eat consistent carb meals- about 3 servings or 1-1.5 cups per meal Monitoring blood glucose levels daily: 2 days before check CBG 4-7 times a day    BEHAVIORAL GOAL FOLLOW UP  Goal attained      Desires to change to basla bolus therpay- coul dnot get in touch wiht Dr. Dyann Kief, so spoke wiht DR. Stann Mainland who agreed. 2 sample pens of Novolog provided toady as patietn will have to wit for Novolg to come in at [facility]    Diabetes Self Management Support: mother and clinic staff  Follow- up: 1 week by phone, 3 weeks with CBg log in office.

## 2011-01-06 NOTE — Letter (Signed)
Summary: Blood Sugar Log  Blood Sugar Log   Imported By: Enedina Finner 06/27/2010 14:27:25  _____________________________________________________________________  External Attachment:    Type:   Image     Comment:   External Document

## 2011-01-06 NOTE — Assessment & Plan Note (Signed)
Summary: ACUTE/MADERA/1 WEEK RECHECK/CH   Vital Signs:  Patient profile:   55 year old female Height:      57 inches (144.78 cm) Weight:      177.5 pounds (80.68 kg) BMI:     38.55 Temp:     97.5 degrees F (36.39 degrees C) oral Pulse rate:   89 / minute BP sitting:   125 / 87  (left arm)  Vitals Entered By: Morrison Old RN (September 04, 2010 2:54 PM) CC: F/U visit for her hands -continues to have pain, dropping things,  the pain wakes her up from sleep. Need braces for her hands. Is Patient Diabetic? Yes Did you bring your meter with you today? No Pain Assessment Patient in pain? yes     Location: hands Intensity: 10 Type: sharp Onset of pain  Constant Nutritional Status BMI of > 30 = obese CBG Result 207  Have you ever been in a relationship where you felt threatened, hurt or afraid?No   Does patient need assistance? Functional Status Self care Ambulation Normal   Primary Care Provider:  Dr. Stanford Scotland  CC:  F/U visit for her hands -continues to have pain, dropping things, and the pain wakes her up from sleep. Need braces for her hands..  History of Present Illness: This is 55 year old female with PMH consitent with uncontrolled DM, HTN and HDL who presents with bilateral hand pain since 3 month.  Hand pain:  started  3 month ago, associated with numbness,  tingling and loss of strength in both hands.  She tried occasionaly ibuprofen which did not relieve the pain significantly. Patient states the pain is bothersome at night, that she is unable to sleep.   She was evaluated earlier this month for this problem. She had a positive RF, however CCP ab were negative. ESR, TSH, ANA were all within normal limits.  Patient has nerve conduction study scheduled for March 2012.   No other complaints or concerns. Denies sob, cough, headache, abdominal pain, changes in bowel or urinary habits.    Depression History:      The patient denies a depressed mood most of the day and a  diminished interest in her usual daily activities.         Preventive Screening-Counseling & Management  Alcohol-Tobacco     Alcohol drinks/day: 0     Smoking Status: never     Passive Smoke Exposure: no  Caffeine-Diet-Exercise     Does Patient Exercise: yes     Type of exercise: walking     Exercise (avg: min/session): 7:00     Times/week: 1  Current Medications (verified): 1)  Lisinopril 20 Mg Tabs (Lisinopril) .... Take 1 Tablet By Mouth Once A Day 2)  Hydrochlorothiazide 25 Mg Tabs (Hydrochlorothiazide) .... Take 1 Tablet By Mouth Once A Day 3)  Aspirin 81 Mg Chew (Aspirin) .... Take 1 Tablet By Mouth Once A Day 4)  Lipitor 20 Mg Tabs (Atorvastatin Calcium) .... Take 1 Tablet By Mouth Once A Day 5)  Nexium 40 Mg  Cpdr (Esomeprazole Magnesium) .... Take 1 Capsule By Mouth Twice A Day 6)  Metformin Hcl 1000 Mg  Tabs (Metformin Hcl) .... Take 1 Pill Twice Daily With Food At Breakfast and Evening Meal 7)  Lantus 100 Unit/ml Soln (Insulin Glargine) .... Inject 25 Units Subcutaneouslyin The Morning When You Wake Up 8)  Lancets  Misc (Lancets) .... Use For Two Times A Day Monitoring of Blood Glucose. 9)  Novolog Flexpen 100 Unit/ml  Soln (Insulin Aspart) .... Inject 6 Units and Self-Titrate Dose To Achieve Target Goal; 10 Minutes Before Each Meal Three Times A Day. (If You Skip A Meal, Skip The Injection) 10)  Relion Mini Pen Needles 31g X 6 Mm Misc (Insulin Pen Needle) .... Use To Take Insulin Injections Three Times A Day Before Meals 11)  Truetrack Test  Strp (Glucose Blood) .... Use To Test Blood Sugar 4x A Day Before Meals and Bedtime 12)  Lancets  Misc (Lancets) .... Use To Check Blood Sugar 4x Daily  Allergies (verified): 1)  ! Elavil  Past History:  Past Medical History: Last updated: 01/06/2010 1. Diabetes mellitus, type II 2. Hyperlipidemia 3. Hypertension 4. h/o chest pain: negative cardiolite September 2007.  ETT-myoview 12/10 with EF 76%, no ischemia, probable breast  attenuation.  Episode chest pain in 12/10 probably costochondritis.                                                                5. GERD: H/o H. pylori positive treated with Prev pac 11/07 6. H/o Endometriosis  Past Surgical History: Last updated: 11/12/2008 Hysterectomy sec to DUB     Family History: Last updated: 11/05/2009 Mother and grandfather had diabetes.  Several family members have HTN and have had cancer of unknown type.  no known colon cancer.  She thinks her grandfather had an MI.   Social History: Last updated: 09/04/2010 Not married.   Works as a Careers information officer, doing nursing duties.  -bath patients, change linen, clean, transport services (pt weighs greater than 200 lbs), moving bed around to clean -basic hygeine assistance  - has been working in this industry since the 1970s Never smoked.  Homeless - lives with friends and mother   Risk Factors: Alcohol Use: 0 (09/04/2010) Exercise: yes (09/04/2010)  Risk Factors: Smoking Status: never (09/04/2010) Passive Smoke Exposure: no (09/04/2010)  Social History: Not married.   Works as a Careers information officer, doing nursing duties.  -bath patients, change linen, clean, transport services (pt weighs greater than 200 lbs), moving bed around to clean -basic hygeine assistance  - has been working in this industry since the 1970s Never smoked.  Homeless - lives with friends and mother   Review of Systems      See HPI  Physical Exam  General:  alert and well-developed.  VS reviewed and BP wnlalert and well-developed.  alert and well-developed.   Head:  normocephalic and atraumatic.  normocephalic and atraumatic.  normocephalic and atraumatic.   Eyes:  vision grossly intact, pupils equal, pupils round, and pupils reactive to light.  vision grossly intact, pupils equal, pupils round, and pupils reactive to light.  vision grossly intact, pupils equal, pupils round, and pupils reactive to light.   Mouth:  pharynx pink  and moist.  pharynx pink and moist.  pharynx pink and moist.   Neck:  supple.  supple.  supple.   Lungs:  normal respiratory effort and normal breath sounds.  normal respiratory effort and normal breath sounds.  normal respiratory effort and normal breath sounds.   Heart:  normal rate and regular rhythm.  normal rate and regular rhythm.  normal rate and regular rhythm.   Abdomen:  soft and non-tender.  soft and non-tender.  soft and non-tender.  Msk:  Phalen and Tineal sign positive  no joint swelling  both hands are tender to palpation no joint warmth no redness over joints Neurologic:  strength decreased in grip    Impression & Recommendations:  Problem # 1:  DIABETES MELLITUS, TYPE II (ICD-250.00) Assessment Deteriorated  A1C has deteriorated from 8.8 to 10. Patient notes elevated blood sugars greater than 200. Pt did not bring her meter today, but plan to increase lantus by 5 units to 30 units. Will have patient come in 1 month and advised her to bring her meter at next visit to re-evaluate her regimen.   Her updated medication list for this problem includes:    Lisinopril 20 Mg Tabs (Lisinopril) .Marland Kitchen... Take 1 tablet by mouth once a day    Aspirin 81 Mg Chew (Aspirin) .Marland Kitchen... Take 1 tablet by mouth once a day    Metformin Hcl 1000 Mg Tabs (Metformin hcl) .Marland Kitchen... Take 1 pill twice daily with food at breakfast and evening meal    Lantus 100 Unit/ml Soln (Insulin glargine) ..... Inject 30 units subcutaneouslyin the morning when you wake up    Novolog Flexpen 100 Unit/ml Soln (Insulin aspart) ..... Inject 6 units and self-titrate dose to achieve target goal; 10 minutes before each meal three times a day. (if you skip a meal, skip the injection)  Orders: T- Capillary Blood Glucose [id number]) T-Hgb A1C (in-house) [id number])  Labs Reviewed: Creat: 0.72 (06/19/2010)     Last Eye Exam: No diabetic retinopathy.  OU Visual acuity OD :      20/20 Visual acuity OS :     20/20 Intraocular pressure  OD:   21   Intraocular pressure OS:   20    (05/01/2009) Reviewed HgBA1c results: 8.8 (06/11/2010)  12.2 (12/30/2009)  Her updated medication list for this problem includes:    Lisinopril 20 Mg Tabs (Lisinopril) .Marland Kitchen... Take 1 tablet by mouth once a day    Aspirin 81 Mg Chew (Aspirin) .Marland Kitchen... Take 1 tablet by mouth once a day    Metformin Hcl 1000 Mg Tabs (Metformin hcl) .Marland Kitchen... Take 1 pill twice daily with food at breakfast and evening meal    Lantus 100 Unit/ml Soln (Insulin glargine) ..... Inject 30 units subcutaneouslyin the morning when you wake up    Novolog Flexpen 100 Unit/ml Soln (Insulin aspart) ..... Inject 6 units and self-titrate dose to achieve target goal; 10 minutes before each meal three times a day. (if you skip a meal, skip the injection)  Problem # 2:  HAND PAIN, BILATERAL (ICD-729.5) Assessment: Deteriorated Bilateral hand pain is more consistent carpal tunnel syndrome. Phalen and Tineal sign positive. Furthermore patient complains of paresthesias more bothersome at night. Her rheum work up was essentially negative. I also believe her hand pain is complicated by diabetic neuropathy. Advised patient to measure her CBGs 2-3 times a day and to increase lantus. DM needs to be aggressively controlled in order to minimize neuropathic symptoms. Plan to provide pt with wrist splints and start amitriptyline as neurontin was not effective. Pt is not inclined to get steroid injections at this time. If this problem continues to persist, pt may need to go to a hand specialist for surgical evaluation, but needs EMG before she can be referred.   Problem # 3:  HYPERTENSION (ICD-401.9) Assessment: Improved Well controlled on current regimen.  Labs up to date.   Her updated medication list for this problem includes:    Lisinopril 20 Mg Tabs (Lisinopril) .Marland Kitchen... Take 1 tablet  by mouth once a day    Hydrochlorothiazide 25 Mg Tabs (Hydrochlorothiazide) .Marland Kitchen... Take 1 tablet by mouth once a day  BP  today: 125/87 Prior BP: 110/77 (08/21/2010)  Prior 10 Yr Risk Heart Disease: 20 % (06/11/2010)  Labs Reviewed: K+: 3.9 (06/19/2010) Creat: : 0.72 (06/19/2010)   Chol: 104 (06/19/2010)   HDL: 38 (06/19/2010)   LDL: 51 (06/19/2010)   TG: 75 (06/19/2010)  Problem # 4:  DIABETIC PERIPHERAL NEUROPATHY (ICD-250.60) Assessment: Deteriorated As mentioned in problem 2, patient's hand pain is complicated by neuropathy. Pt was prescribed gabapentin that was instructed to be titrated up. Pt did titrate up, but states it did not relieve her pain, numbness at all. Will try amitriptyline for patient. Advised patient of the possible side effects of amitriptyline, most notably dry mouth. Other meds that may be effective in this patient such as Lyrica are not affordable. Will try amitriptyline for now and strongly encouraged patient to take insulin as directed, as well as dietary modification. Patient needs better control of DM before neuropathic symptoms can improve.   Her updated medication list for this problem includes:    Lisinopril 20 Mg Tabs (Lisinopril) .Marland Kitchen... Take 1 tablet by mouth once a day    Aspirin 81 Mg Chew (Aspirin) .Marland Kitchen... Take 1 tablet by mouth once a day    Metformin Hcl 1000 Mg Tabs (Metformin hcl) .Marland Kitchen... Take 1 pill twice daily with food at breakfast and evening meal    Lantus 100 Unit/ml Soln (Insulin glargine) ..... Inject 30 units subcutaneouslyin the morning when you wake up    Novolog Flexpen 100 Unit/ml Soln (Insulin aspart) ..... Inject 6 units and self-titrate dose to achieve target goal; 10 minutes before each meal three times a day. (if you skip a meal, skip the injection)  Complete Medication List: 1)  Lisinopril 20 Mg Tabs (Lisinopril) .... Take 1 tablet by mouth once a day 2)  Hydrochlorothiazide 25 Mg Tabs (Hydrochlorothiazide) .... Take 1 tablet by mouth once a day 3)  Aspirin 81 Mg Chew (Aspirin) .... Take 1 tablet by mouth once a day 4)  Lipitor 20 Mg Tabs (Atorvastatin  calcium) .... Take 1 tablet by mouth once a day 5)  Nexium 40 Mg Cpdr (Esomeprazole magnesium) .... Take 1 capsule by mouth twice a day 6)  Metformin Hcl 1000 Mg Tabs (Metformin hcl) .... Take 1 pill twice daily with food at breakfast and evening meal 7)  Lantus 100 Unit/ml Soln (Insulin glargine) .... Inject 30 units subcutaneouslyin the morning when you wake up 8)  Lancets Misc (Lancets) .... Use for two times a day monitoring of blood glucose. 9)  Novolog Flexpen 100 Unit/ml Soln (Insulin aspart) .... Inject 6 units and self-titrate dose to achieve target goal; 10 minutes before each meal three times a day. (if you skip a meal, skip the injection) 10)  Relion Mini Pen Needles 31g X 6 Mm Misc (Insulin pen needle) .... Use to take insulin injections three times a day before meals 11)  Truetrack Test Strp (Glucose blood) .... Use to test blood sugar 4x a day before meals and bedtime 12)  Lancets Misc (Lancets) .... Use to check blood sugar 4x daily 13)  Amitriptyline Hcl 50 Mg Tabs (Amitriptyline hcl) .... Take 1 tab by mouth at bedtime  Patient Instructions: 1)  Follow up in 1 month. Please remember to bring your meter at this visit as your lantus was increased to 30 units at this time. 2)  Please take all  other medications as prescribed.  Prescriptions: AMITRIPTYLINE HCL 50 MG TABS (AMITRIPTYLINE HCL) Take 1 tab by mouth at bedtime  #31 x 0   Entered and Authorized by:   Jolene Provost MD   Signed by:   Jolene Provost MD on 09/04/2010   Method used:   Print then Give to Patient   RxID:   [id number]   Prevention & Chronic Care Immunizations   Influenza vaccine: Fluvax Non-MCR  (08/21/2010)   Influenza vaccine deferral: Deferred  (05/24/2009)   Influenza vaccine due: 08/07/2009    Tetanus booster: Not documented   Td booster deferral: Deferred  (05/24/2009)    Pneumococcal vaccine: Not documented   Pneumococcal vaccine deferral: Deferred  (05/24/2009)  Colorectal Screening    Hemoccult: Not documented   Hemoccult action/deferral: Ordered  (06/11/2010)   Hemoccult due: 05/24/2010    Colonoscopy: Location:  [place].    (12/18/2008)   Colonoscopy action/deferral: Deferred  (05/24/2009)   Colonoscopy due: 12/18/2018  Other Screening   Pap smear: Not documented   Pap smear action/deferral: Not indicated S/P hysterectomy  (09/04/2010)   Pap smear due: 06/11/2013    Mammogram: IMPRESSION:     No specific mammographic evidence of malignancy.                    ASSESSMENT: Negative - BI-RADS 1            (12/10/2009)   Mammogram action/deferral: Next screening  mammogram is recommended in one year.  (12/10/2009)   Mammogram due: 12/2010   Smoking status: never  (09/04/2010)  Diabetes Mellitus   HgbA1C: 10.0  (09/04/2010)   Hemoglobin A1C due: 12/10/2009    Eye exam: No diabetic retinopathy.  OU Visual acuity OD :      20/20 Visual acuity OS :     20/20 Intraocular pressure OD:   21   Intraocular pressure OS:   20     (05/01/2009)   Diabetic eye exam action/deferral: Ophthalmology referral  (06/11/2010)   Eye exam due: 05/2010    Foot exam: yes  (08/21/2010)   Foot exam action/deferral: Do today   High risk foot: No  (08/21/2010)   Foot care education: Done  (12/30/2009)   Foot exam due: 06/12/2011    Urine microalbumin/creatinine ratio: 299.1  (06/19/2010)   Urine microalbumin action/deferral: Ordered   Urine microalbumin/cr due: 06/12/2011    Diabetes flowsheet reviewed?: Yes   Progress toward A1C goal: Deteriorated  Lipids   Total Cholesterol: 104  (06/19/2010)   Lipid panel action/deferral: Lipid Panel ordered   LDL: 51  (06/19/2010)   LDL Direct: Not documented   HDL: 38  (06/19/2010)   Triglycerides: 75  (06/19/2010)   Lipid panel due: 12/12/2010    SGOT (AST): 29  (11/20/2009)   SGPT (ALT): 26  (11/20/2009)   Alkaline phosphatase: 70  (11/20/2009)   Total bilirubin: 0.9  (11/20/2009)   Liver panel due:  06/12/2011    Lipid flowsheet reviewed?: Yes   Progress toward LDL goal: At goal  Hypertension   Last Blood Pressure: 125 / 87  (09/04/2010)   Serum creatinine: 0.72  (06/19/2010)   BMP action: Ordered   Serum potassium 3.9  (123456)   Basic metabolic panel due: 99991111    Hypertension flowsheet reviewed?: Yes   Progress toward BP goal: At goal  Self-Management Support :   Personal Goals (by the next clinic visit) :     Personal A1C goal: 7  (09/30/2009)  Personal blood pressure goal: 130/80  (06/13/2009)     Personal LDL goal: 70  (06/13/2009)    Patient will work on the following items until the next clinic visit to reach self-care goals:     Medications and monitoring: take my medicines every day, bring all of my medications to every visit, examine my feet every day  (09/04/2010)     Eating: drink diet soda or water instead of juice or soda, eat more vegetables, use fresh or frozen vegetables, eat foods that are low in salt, eat baked foods instead of fried foods, eat fruit for snacks and desserts  (09/04/2010)     Activity: take a 30 minute walk every day, take the stairs instead of the elevator  (09/04/2010)     Other: bring meter to every visit  (12/30/2009)     Home glucose monitoring frequency: 2 times a day  (06/13/2009)    Diabetes self-management support: Written self-care plan  (09/04/2010)   Diabetes care plan printed   Last diabetes self-management training by diabetes educator: 06/19/2010   Last medical nutrition therapy: 10/22/2008    Hypertension self-management support: Written self-care plan  (09/04/2010)   Hypertension self-care plan printed.    Lipid self-management support: Written self-care plan  (09/04/2010)   Lipid self-care plan printed.    Laboratory Results   Blood Tests   Date/Time Received: September 04, 2010 3:56 PM  Date/Time Reported: Maryan Rued  September 04, 2010 3:56 PM   HGBA1C: 10.0%   (Normal Range: Non-Diabetic -  3-6%   Control Diabetic - 6-8%) CBG Random:: 207mg /dL      Appended Document: ACUTE/MADERA/1 WEEK RECHECK/CH Also advised patient to continue with NSAIDs such as ibuprofen, and pick up meloxicam prescription if she can afford it for hand pain.   Appended Document: ACUTE/MADERA/1 WEEK RECHECK/CH Bilateral wrist splints  given to pt. yesterday also.  Appended Document: ACUTE/MADERA/1 WEEK RECHECK/CH Of note, the splints that were given to patient were provided from clinic (elastic only). Patient may need a prescription for wrist brace with removable metal stay on the palmar surface. She can get this splint from med supply store, which may be more effective.

## 2011-01-06 NOTE — Assessment & Plan Note (Signed)
Summary: TB SHOT PT BRINGING PAPERS/CFB  Nurse Visit   Allergies: 1)  ! Elavil  Immunizations Administered:  PPD Skin Test:    Vaccine Type: PPD    Site: left forearm    Mfr: Sanofi Pasteur    Dose: 0.1 ml    Route: ID    Given by: Mateo Flow (Hana)    Lot #: [id number]  Orders Added: 1)  TB Skin Test [86580] 2)  Admin 1st Vaccine [90471]  PT HERE FOR PPD THAT HER JOB REQUIRES SHE GETS DONE ANNUALLY. PT WILL RETURN ON FRIDAY TO HAVE RESULTS READ.Marland KitchenMateo Flow [facility])  May 20, 2010 3:10 PM  Appended Document: TB SHOT PT BRINGING PAPERS/CFB PPD read on 6/17  -   Negative paperwork signed for her job

## 2011-01-06 NOTE — Progress Notes (Signed)
Summary: phone note/gp  Phone Note Call from Patient   Caller: Patient Summary of Call: Pt. called and stated she is still having hand pain and wants to try steroid injections. Also stated she cannot afford Meloxicam.  Thanks  Pt.'s telephone# [phone]. Initial call taken by: Morrison Old RN,  September 09, 2010 10:21 AM  Follow-up for Phone Call        Please refer patient to sports medicine for steroid injections. Follow-up by: Jolene Provost MD,  September 09, 2010 2:14 PM  Additional Follow-up for Phone Call Additional follow up Details #1::        Appt. scheduled at [place].; pt. was called. Additional Follow-up by: Morrison Old RN,  September 10, 2010 2:27 PM

## 2011-01-06 NOTE — Progress Notes (Signed)
Summary: return call for high sugar/diabetes support/dmr  Phone Note Outgoing Call   Call placed by: Barnabas Harries RD,CDE,  August 18, 2010 2:41 PM Summary of Call: asked by triage nurse to call patient about high blood sugar: had cut back on Novolog because she was running out. Novolog 6 units before each meal three times for last two weeks.   Only got 1 pen from [facility] sample while she was waiting and 1 pen from [place]. Wants to know if she can take more lantus or naother shot to improve her blood sugars. Last time she checked blood sugars was saturday and it was  292. She has an appointment with doctor and CDE this Thursday.  told patient that Lantus insulin should not be used to correct hgih blood sugars, Novolog would be used to correct high blood sugars. However, since she has not been testing very often explained that it is not safe to add a correction insulin. Patient to collect information by testing 3-4 times a day and bring with her to appointment Thursday.

## 2011-01-08 NOTE — Progress Notes (Signed)
Summary: phone/gg  Phone Note Call from Patient   Summary of Call: Pt call with c/o chest pain, tightness and heaviness and feeling of passing out. This was after watching football game last night.  She went to bed and was able to sleep. Today felt better, headache.  We have no appointments this week.  Pt advised to go to ED for evaluation of the Chest Pain. Patient/caller verbalizes understanding of these instructions.  Initial call taken by: Gevena Cotton RN,  December 29, 2010 3:54 PM  Follow-up for Phone Call        agree Follow-up by: Rhea Pink  DO,  December 29, 2010 5:12 PM

## 2011-02-13 ENCOUNTER — Encounter: Payer: Self-pay | Attending: Physical Medicine & Rehabilitation

## 2011-02-13 ENCOUNTER — Ambulatory Visit: Payer: Self-pay | Admitting: Physical Medicine & Rehabilitation

## 2011-02-13 DIAGNOSIS — G5610 Other lesions of median nerve, unspecified upper limb: Secondary | ICD-10-CM

## 2011-02-13 DIAGNOSIS — G5690 Unspecified mononeuropathy of unspecified upper limb: Secondary | ICD-10-CM | POA: Insufficient documentation

## 2011-02-16 ENCOUNTER — Ambulatory Visit (INDEPENDENT_AMBULATORY_CARE_PROVIDER_SITE_OTHER): Payer: Self-pay | Admitting: Internal Medicine

## 2011-02-16 ENCOUNTER — Encounter: Payer: Self-pay | Admitting: Internal Medicine

## 2011-02-16 ENCOUNTER — Telehealth: Payer: Self-pay | Admitting: *Deleted

## 2011-02-16 VITALS — BP 132/90 | HR 79 | Temp 97.6°F | Resp 20 | Ht <= 58 in | Wt 185.4 lb

## 2011-02-16 DIAGNOSIS — E119 Type 2 diabetes mellitus without complications: Secondary | ICD-10-CM

## 2011-02-16 DIAGNOSIS — R109 Unspecified abdominal pain: Secondary | ICD-10-CM | POA: Insufficient documentation

## 2011-02-16 DIAGNOSIS — R195 Other fecal abnormalities: Secondary | ICD-10-CM

## 2011-02-16 LAB — CBC WITH DIFFERENTIAL/PLATELET
Basophils Absolute: 0 10*3/uL (ref 0.0–0.1)
HCT: 38 % (ref 36.0–46.0)
Hemoglobin: 12.9 g/dL (ref 12.0–15.0)
Lymphocytes Relative: 56 % — ABNORMAL HIGH (ref 12–46)
Lymphs Abs: 4.7 10*3/uL — ABNORMAL HIGH (ref 0.7–4.0)
MCV: 80 fL (ref 78.0–100.0)
Monocytes Absolute: 0.7 10*3/uL (ref 0.1–1.0)
Monocytes Relative: 8 % (ref 3–12)
Neutro Abs: 2.9 10*3/uL (ref 1.7–7.7)
RBC: 4.75 MIL/uL (ref 3.87–5.11)
RDW: 14.1 % (ref 11.5–15.5)
WBC: 8.3 10*3/uL (ref 4.0–10.5)

## 2011-02-16 LAB — COMPREHENSIVE METABOLIC PANEL
AST: 19 U/L (ref 0–37)
Albumin: 4.1 g/dL (ref 3.5–5.2)
BUN: 14 mg/dL (ref 6–23)
CO2: 25 mEq/L (ref 19–32)
Calcium: 9.3 mg/dL (ref 8.4–10.5)
Chloride: 101 mEq/L (ref 96–112)
Potassium: 3.9 mEq/L (ref 3.5–5.3)

## 2011-02-16 LAB — LIPASE: Lipase: 76 U/L — ABNORMAL HIGH (ref 0–75)

## 2011-02-16 NOTE — Assessment & Plan Note (Signed)
May be 2/2 to parasitic infection. Will review stool for ova and parasites. Will check cbc/diff, cmet, and lipase.

## 2011-02-16 NOTE — Assessment & Plan Note (Signed)
Uncontrolled. Patient instructed to increase Lantus to 30 unit sq daily. Will review meter on follow up.

## 2011-02-16 NOTE — Telephone Encounter (Signed)
Pt given appointment for today

## 2011-02-16 NOTE — Patient Instructions (Signed)
Make a follow up appointment in two weeks.  Increase Lantus 30 units daily.

## 2011-02-16 NOTE — Progress Notes (Signed)
  Subjective:    Patient ID: Nancy [name], female    DOB: [birth year], 55 y.o.   MRN: [id number]  HPI  55 yr old woman with  Past Medical History  Diagnosis Date  . Diabetic peripheral neuropathy   . Hand pain     bilateral  . Hyperlipidemia   . Diabetes mellitus   . GERD (gastroesophageal reflux disease)   . Heat edema   . History of Helicobacter infection 11/07  . History of chest pain     negative cardiolite September 2007.  ETT-myoview 12/10 with EF 76%, no ischemia,   . History of endometriosis    Comes for abdominal pain for about 1-2 months. Located mid lower abdomen. 3/10 intensity. Described as achy. Worse when patient pulls out "string" from anus after bowel movement. Patient had two episodes pulling out "string". First episode was about one month ago. Last episode was 5 days ago.  Patient brought in "string" for evaluation. Denies fever/chills, constipation, diarrhea, travel history, melena, hematochezia, other contacts with same symptoms.     Review of Systems  All other systems reviewed and are negative.       Objective:   Physical Exam  Vitals reviewed. Constitutional: She is oriented to person, place, and time. She appears well-developed and well-nourished. No distress.  HENT:  Mouth/Throat: Oropharynx is clear and moist.  Eyes: Conjunctivae and EOM are normal. Pupils are equal, round, and reactive to light.  Neck: Normal range of motion. Neck supple.  Cardiovascular: Normal rate, regular rhythm and normal heart sounds.   Pulmonary/Chest: Effort normal and breath sounds normal.  Abdominal: Soft. Bowel sounds are normal. She exhibits no mass. There is no rebound and no guarding.       ttp suprapubic area  Musculoskeletal: Normal range of motion.  Neurological: She is alert and oriented to person, place, and time.  Skin: No rash noted. No erythema.  Psychiatric: She has a normal mood and affect.          Assessment & Plan:

## 2011-02-16 NOTE — Assessment & Plan Note (Signed)
Unknown at this point what "string" is but concerning for Tapeworm, hookworm. Will review labs.

## 2011-02-16 NOTE — Telephone Encounter (Signed)
Pt called stating she was at work last Saturday and had a BM and she noted string looking object came out with her stool.  This happened once during the week. She thinks it looks like a piece of cloth.  This scared her.  She kept the [facility] and wants someone to look at it. She states she had some Abd pain before BM . She can come in after 1230.

## 2011-02-16 NOTE — Telephone Encounter (Signed)
Agree should be evaluated

## 2011-02-17 LAB — OVA AND PARASITE SCREEN

## 2011-02-19 LAB — GLUCOSE, CAPILLARY: Glucose-Capillary: 332 mg/dL — ABNORMAL HIGH (ref 70–99)

## 2011-02-25 LAB — GLUCOSE, CAPILLARY: Glucose-Capillary: 65 mg/dL — ABNORMAL LOW (ref 70–99)

## 2011-03-02 ENCOUNTER — Encounter: Payer: Self-pay | Admitting: Internal Medicine

## 2011-03-02 DIAGNOSIS — E119 Type 2 diabetes mellitus without complications: Secondary | ICD-10-CM

## 2011-03-04 ENCOUNTER — Encounter: Payer: Self-pay | Admitting: Internal Medicine

## 2011-03-04 ENCOUNTER — Ambulatory Visit (INDEPENDENT_AMBULATORY_CARE_PROVIDER_SITE_OTHER): Payer: Self-pay | Admitting: Internal Medicine

## 2011-03-04 DIAGNOSIS — E119 Type 2 diabetes mellitus without complications: Secondary | ICD-10-CM

## 2011-03-04 DIAGNOSIS — R198 Other specified symptoms and signs involving the digestive system and abdomen: Secondary | ICD-10-CM

## 2011-03-04 DIAGNOSIS — M79609 Pain in unspecified limb: Secondary | ICD-10-CM

## 2011-03-04 DIAGNOSIS — I10 Essential (primary) hypertension: Secondary | ICD-10-CM

## 2011-03-04 NOTE — Assessment & Plan Note (Signed)
Large amount of mucous in bowel movement. Concerning for mucinous adenocarcinoma of the colon. Stool for ova and parasites was done which rule out parasites; specimen should it was mucous. Will refer to Gastroenterology for further evaluation.

## 2011-03-04 NOTE — Assessment & Plan Note (Signed)
Controlled. Continue current regimen. 

## 2011-03-04 NOTE — Patient Instructions (Signed)
Make a follow up appointment in 1 month. Take all medication as directed.

## 2011-03-04 NOTE — Progress Notes (Signed)
  Subjective:    Patient ID: Nancy [name], female    DOB: [birth year], 55 y.o.   MRN: [id number]  HPI  55 yo woman with  Past Medical History  Diagnosis Date  . Diabetic peripheral neuropathy   . Hand pain     bilateral  . Hyperlipidemia   . Diabetes mellitus   . GERD (gastroesophageal reflux disease)   . Heat edema   . History of Helicobacter infection 11/07  . History of chest pain     negative cardiolite September 2007.  ETT-myoview 12/10 with EF 76%, no ischemia,   . History of endometriosis    comes to the clinic for diabetes management. Patient increased Lantus but has not checked her blood glucose levels.   Patient has had 2-3 episodes of mucous in bowel movements since last being seen. Associated with abdominal pain that last for several hours. Denies diarrhea, weight loss, n/v, hematochezia, melena, constipation,   Review of Systems  [all other systems reviewed and are negative       Objective:   Physical Exam  [vitalsreviewed. Constitutional: She is oriented to person, place, and time. She appears well-developed and well-nourished. No distress.  HENT:  Mouth/Throat: Oropharynx is clear and moist.  Eyes: Conjunctivae and EOM are normal. Pupils are equal, round, and reactive to light.  Neck: Normal range of motion. Neck supple.  Cardiovascular: Normal rate, regular rhythm and normal heart sounds.   Pulmonary/Chest: Effort normal and breath sounds normal.  Abdominal: Soft. Bowel sounds are normal. She exhibits no mass. There is no rebound and no guarding.       ttp suprapubic area  Musculoskeletal: Normal range of motion.  Neurological: She is alert and oriented to person, place, and time.  Skin: No rash noted. No erythema.  Psychiatric: She has a normal mood and affect.          Assessment & Plan:

## 2011-03-04 NOTE — Assessment & Plan Note (Signed)
Patient was instructed to check blood glucose levels at least three times a day. Will have patient follow up with Diabetes Educator.

## 2011-03-04 NOTE — Assessment & Plan Note (Addendum)
Nerve conduction test was done. Results showed right median neuropathy. Patient instructed to wear wrist splint. May consider decompression surgery in the future if pain worsens.

## 2011-03-06 ENCOUNTER — Encounter: Payer: Self-pay | Admitting: Internal Medicine

## 2011-03-10 ENCOUNTER — Encounter: Payer: Self-pay | Admitting: *Deleted

## 2011-03-10 ENCOUNTER — Emergency Department (HOSPITAL_COMMUNITY)
Admission: EM | Admit: 2011-03-10 | Discharge: 2011-03-10 | Disposition: A | Discharge status: Home / Self Care | Payer: Self-pay | Attending: Emergency Medicine | Admitting: Emergency Medicine

## 2011-03-10 DIAGNOSIS — M25519 Pain in unspecified shoulder: Secondary | ICD-10-CM | POA: Insufficient documentation

## 2011-03-10 DIAGNOSIS — M546 Pain in thoracic spine: Secondary | ICD-10-CM | POA: Insufficient documentation

## 2011-03-10 DIAGNOSIS — Z7982 Long term (current) use of aspirin: Secondary | ICD-10-CM | POA: Insufficient documentation

## 2011-03-10 DIAGNOSIS — M542 Cervicalgia: Secondary | ICD-10-CM | POA: Insufficient documentation

## 2011-03-10 DIAGNOSIS — E119 Type 2 diabetes mellitus without complications: Secondary | ICD-10-CM | POA: Insufficient documentation

## 2011-03-10 DIAGNOSIS — Z794 Long term (current) use of insulin: Secondary | ICD-10-CM | POA: Insufficient documentation

## 2011-03-10 DIAGNOSIS — K219 Gastro-esophageal reflux disease without esophagitis: Secondary | ICD-10-CM | POA: Insufficient documentation

## 2011-03-10 DIAGNOSIS — Z79899 Other long term (current) drug therapy: Secondary | ICD-10-CM | POA: Insufficient documentation

## 2011-03-10 DIAGNOSIS — E7800 Pure hypercholesterolemia, unspecified: Secondary | ICD-10-CM | POA: Insufficient documentation

## 2011-03-10 DIAGNOSIS — F411 Generalized anxiety disorder: Secondary | ICD-10-CM | POA: Insufficient documentation

## 2011-03-10 DIAGNOSIS — I10 Essential (primary) hypertension: Secondary | ICD-10-CM | POA: Insufficient documentation

## 2011-03-10 NOTE — Progress Notes (Unsigned)
Pt presents after being seen in ED last pm, neck pain, she already had an appt in place for 4/10 and will keep that , she is advised to follow the instructions given in ED and call clinic or after hours if needed, she is agreeable.

## 2011-03-10 NOTE — Progress Notes (Unsigned)
  Subjective:    Patient ID: Nancy [name], female    DOB: 02/13/1956, 55 y.o.   MRN: [id number]  HPI    Review of Systems     Objective:   Physical Exam        Assessment & Plan:

## 2011-03-12 LAB — COMPREHENSIVE METABOLIC PANEL
ALT: 25 U/L (ref 0–35)
AST: 27 U/L (ref 0–37)
Albumin: 3.5 g/dL (ref 3.5–5.2)
Alkaline Phosphatase: 69 U/L (ref 39–117)
Chloride: 105 mEq/L (ref 96–112)
GFR calc Af Amer: >60 mL/min (ref >60)
Potassium: 3.7 mEq/L (ref 3.5–5.1)
Total Bilirubin: 0.7 mg/dL (ref 0.3–1.2)

## 2011-03-12 LAB — GLUCOSE, CAPILLARY
Glucose-Capillary: 265 mg/dL — ABNORMAL HIGH (ref 70–99)
Glucose-Capillary: 186 mg/dL — ABNORMAL HIGH (ref 70–99)
Glucose-Capillary: 237 mg/dL — ABNORMAL HIGH (ref 70–99)
Glucose-Capillary: 238 mg/dL — ABNORMAL HIGH (ref 70–99)
Glucose-Capillary: 271 mg/dL — ABNORMAL HIGH (ref 70–99)

## 2011-03-12 LAB — CBC
HCT: 35.5 % — ABNORMAL LOW (ref 36.0–46.0)
MCHC: 34 g/dL (ref 30.0–36.0)
MCV: 83.6 fL (ref 78.0–100.0)
Platelets: 170 10*3/uL (ref 150–400)
Platelets: 213 10*3/uL (ref 150–400)
WBC: 7.9 10*3/uL (ref 4.0–10.5)
WBC: 8.5 10*3/uL (ref 4.0–10.5)

## 2011-03-12 LAB — BASIC METABOLIC PANEL
CO2: 26 mEq/L (ref 19–32)
Calcium: 8.3 mg/dL — ABNORMAL LOW (ref 8.4–10.5)
Creatinine, Ser: 0.88 mg/dL (ref 0.4–1.2)
Glucose, Bld: 248 mg/dL — ABNORMAL HIGH (ref 70–99)
Sodium: 137 mEq/L (ref 135–145)

## 2011-03-12 LAB — RAPID URINE DRUG SCREEN, HOSP PERFORMED: Tetrahydrocannabinol: POSITIVE — AB

## 2011-03-12 LAB — CARDIAC PANEL(CRET KIN+CKTOT+MB+TROPI)
CK, MB: 1.5 ng/mL (ref 0.3–4.0)
Relative Index: INVALID (ref 0.0–2.5)
Total CK: 98 U/L (ref 7–177)
Troponin I: 0.01 ng/mL (ref 0.00–0.06)
Troponin I: <0.01 ng/mL (ref 0.00–0.06)
Troponin I: <0.01 ng/mL (ref 0.00–0.06)

## 2011-03-12 LAB — TSH: TSH: 3.264 u[IU]/mL (ref 0.350–4.500)

## 2011-03-12 LAB — LIPID PANEL: HDL: 33 mg/dL — ABNORMAL LOW (ref >39)

## 2011-03-15 LAB — GLUCOSE, CAPILLARY: Glucose-Capillary: 189 mg/dL — ABNORMAL HIGH (ref 70–99)

## 2011-03-17 ENCOUNTER — Encounter: Payer: Self-pay | Admitting: Ophthalmology

## 2011-03-17 ENCOUNTER — Ambulatory Visit (INDEPENDENT_AMBULATORY_CARE_PROVIDER_SITE_OTHER): Payer: Self-pay | Admitting: Ophthalmology

## 2011-03-17 DIAGNOSIS — I10 Essential (primary) hypertension: Secondary | ICD-10-CM

## 2011-03-17 DIAGNOSIS — G5603 Carpal tunnel syndrome, bilateral upper limbs: Secondary | ICD-10-CM

## 2011-03-17 DIAGNOSIS — R198 Other specified symptoms and signs involving the digestive system and abdomen: Secondary | ICD-10-CM

## 2011-03-17 DIAGNOSIS — M25519 Pain in unspecified shoulder: Secondary | ICD-10-CM

## 2011-03-17 DIAGNOSIS — E119 Type 2 diabetes mellitus without complications: Secondary | ICD-10-CM

## 2011-03-17 DIAGNOSIS — G5600 Carpal tunnel syndrome, unspecified upper limb: Secondary | ICD-10-CM

## 2011-03-17 LAB — GLUCOSE, CAPILLARY
Glucose-Capillary: 238 mg/dL — ABNORMAL HIGH (ref 70–99)
Glucose-Capillary: 240 mg/dL — ABNORMAL HIGH (ref 70–99)

## 2011-03-17 NOTE — Assessment & Plan Note (Signed)
The patient continues to have mucus in her stool 2-3 times per week, she's scheduled for a GI followup on May 9. The patient denies any blood in her stool.

## 2011-03-17 NOTE — Patient Instructions (Signed)
I am referring you to a neurosurgeon for further evaluation of your carpal tunnel syndrome.  Please keep your regularly scheduled appointment with Dr. Wendee Beavers for further medical management.

## 2011-03-17 NOTE — Assessment & Plan Note (Addendum)
The patient states that her CBGs have been improving since her last visit with Dr. Wendee Beavers.  The patient did not bring her glucose monitor to this visit today, as Dr. Wendee Beavers recommended that she bring it when she comes on the 28th. I will review the patients monitor at her next visit and adjust insulin accordingly.  patient is using her Lantus and NovoLog as prescribed daily. Lab Results  Component Value Date   HGBA1C 10.7 02/16/2011   HGBA1C 10.0 09/04/2010   HGBA1C 8.8 06/11/2010   Lab Results  Component Value Date   MICROALBUR 20.58* 06/19/2010   LDLCALC 51 06/19/2010   CREATININE 0.90 02/16/2011

## 2011-03-17 NOTE — Assessment & Plan Note (Signed)
This was evaluated in the ED and x-rays were not felt to be necessary. There is significant tenderness to palpation across the entire trapezius. There is full range of motion in the neck. At this point, would recommend continued conservative management, with ibuprofen as needed as well as warm compresses at night.

## 2011-03-17 NOTE — Assessment & Plan Note (Addendum)
The patient has struggled with carpal tunnel syndrome for over one year. At this point in time, she has failed conservative management and request surgery consultation. Nerve conduction study does demonstrate a right medial neuropathy, though the patient feels her symptoms are worse on the left. The patient does have decreased strength bilaterally but no obvious muscle atrophy. I plan to refer the patient to neurosurgery today however there is no neurosurgeon on the orange card, as such all refer her to orthopedic surgery. I discontinued the patient's Neurontin and amitriptyline as she has not been taking his medications due to lack of efficacy. I am requesting the nerve conduction records today.

## 2011-03-17 NOTE — Assessment & Plan Note (Signed)
The patients blood pressure was within reasonable control today (BP: 138/91 mmHg ) and I will not make any adjustments to the patients anti-hypertensive regimen. I will continue to monitor and titrate the patients medications as needed at future visits.

## 2011-03-17 NOTE — Progress Notes (Signed)
Subjective:    Patient ID: Nancy [name], female    DOB: [birth year], 55 y.o.   MRN: [id number]  HPI  This is a 55 year old female with past medical history significant for carpal tunnel syndrome, hypertension, and diabetes, who presents for ER followup for left shoulder pain. The patient was given ibuprofen as well as Vicodin and instructed her rest her shoulder, as the ER physician felt that she had cervical/shoulder sprain. The patient's symptoms have greatly improved since her ED visit on April 3, as she does continue to have pain but is improved but by her ibuprofen. The patient has had no new neurologic symptoms of concern and denies any fevers or chills. The patient's primary concern today is her carpal tunnel syndrome. Per Dr. Jorge Mandril last note, she does have a right median neuropathy, results of this study are not available at this time but the patient's history is classic with both pain and paresthesias. The patient has tried conservative measures including wrist splints which she did not tolerate due to pain, wrist steroid injections, as well as gabapentin and amitriptyline, none of which have helped. The patient's symptoms have been ongoing for at least the last year. The patient requests a neurosurgery consultation at this time. With regards to the patient's mucinous bowel movements noted in the last note, these have continued in her couple times per week. The patient is scheduled for GI on May 9.  Review of Systems  Constitutional: Negative for fever and chills.  Respiratory: Negative for cough and shortness of breath.   Cardiovascular: Negative for chest pain and palpitations.  Gastrointestinal: Negative for vomiting, diarrhea and constipation.       Objective:   Physical Exam  Constitutional: She appears well-developed and well-nourished.  HENT:  Head: Normocephalic and atraumatic.  Eyes: Pupils are equal, round, and reactive to light.  Cardiovascular: Normal rate, regular  rhythm and intact distal pulses.  Exam reveals no gallop and no friction rub.   No murmur heard. Pulmonary/Chest: Effort normal and breath sounds normal. She has no wheezes. She has no rales.  Abdominal: Soft. Bowel sounds are normal. She exhibits no distension. There is no tenderness.  Musculoskeletal: Normal range of motion.       There is decreased sensation to light touch in the medial nerve distribution of the left hand. There is decreased grip strength bilaterally worse on the right. There is no obvious muscular atrophy of either hand.  There is tenderness to palpation along the entire left trapezius, without sinus process tenderness, neck has full range of motion.  Neurological: She is alert. No cranial nerve deficit.  Skin: No rash noted.        Current Outpatient Prescriptions on File Prior to Visit  Medication Sig Dispense Refill  . aspirin 81 MG EC tablet Take 81 mg by mouth daily.        Marland Kitchen atorvastatin (LIPITOR) 20 MG tablet Take 20 mg by mouth daily.        Marland Kitchen esomeprazole (NEXIUM) 40 MG capsule Take 40 mg by mouth 2 (two) times daily.        Marland Kitchen glucose blood (TRUETRACK TEST) test strip Use to test blood sugar 4 times a day before meals and bedtime       . hydrochlorothiazide 25 MG tablet Take 25 mg by mouth daily.        . insulin aspart (NOVOLOG FLEXPEN) 100 UNIT/ML injection Inject into the skin. 7 units and self-titrate dose to achieve target goal; 10 minutes  before each meal 3 times a day. If you skip a meal, skip the injection.      . insulin glargine (LANTUS) 100 UNIT/ML injection Inject 30 Units into the skin every morning. When you wake up      . Insulin Pen Needle (RELION MINI PEN NEEDLES) 31G X 6 MM MISC Use to take insulin injections 3 times a day before meals       . Lancets MISC Use to check blood sugar 4 times a day       . metFORMIN (GLUCOPHAGE) 1000 MG tablet Take 1,000 mg by mouth 2 (two) times daily. With breakfast and evening meal       . DISCONTD:  amitriptyline (ELAVIL) 50 MG tablet Take 50 mg by mouth at bedtime.       Marland Kitchen DISCONTD: gabapentin (NEURONTIN) 300 MG capsule Take by mouth. 2 capsules in the morning and 3 capsules at night        Past Medical History  Diagnosis Date  . Diabetic peripheral neuropathy   . Hand pain     bilateral  . Hyperlipidemia   . Diabetes mellitus   . GERD (gastroesophageal reflux disease)   . Heat edema   . History of Helicobacter infection 11/07  . History of chest pain     negative cardiolite September 2007.  ETT-myoview 12/10 with EF 76%, no ischemia,   . History of endometriosis      Assessment & Plan:

## 2011-03-18 LAB — GLUCOSE, CAPILLARY: Glucose-Capillary: 552 mg/dL (ref 70–99)

## 2011-03-23 ENCOUNTER — Ambulatory Visit: Payer: Self-pay | Admitting: Dietician

## 2011-03-23 LAB — GLUCOSE, CAPILLARY
Glucose-Capillary: 404 mg/dL — ABNORMAL HIGH (ref 70–99)
Glucose-Capillary: 489 mg/dL — ABNORMAL HIGH (ref 70–99)
Glucose-Capillary: 352 mg/dL — ABNORMAL HIGH (ref 70–99)

## 2011-03-30 ENCOUNTER — Telehealth: Payer: Self-pay | Admitting: *Deleted

## 2011-03-30 NOTE — Telephone Encounter (Signed)
Pt was here to see Clarice Pole this morning. She stated Nexium is not helping with the acid reflux; she requested something stronger.  Stated she had 2 episodes this weekend with acid reflux in which she felt like she choking.  Please advise.  Thanks

## 2011-03-31 ENCOUNTER — Telehealth: Payer: Self-pay | Admitting: *Deleted

## 2011-03-31 NOTE — Telephone Encounter (Signed)
Message copied by Morrison Old on Tue Mar 31, 2011  3:57 PM ------      Message from: Rosalia Hammers      Created: Mon Mar 30, 2011  3:43 PM      Regarding: Reflux       Dear Holley Raring,       Patient has an appointment on 4/27. Patient may have Gastroparesis with long standing DM. She may need further evaluation. She also has an appointment with GI on 5/9.            At this point I do not want to change anything.             Kind regards      Rosalia Hammers

## 2011-03-31 NOTE — Telephone Encounter (Signed)
Pt was called and informed to keep appt on 4/27; she said she will be here. Also informed no change in medication per Dr. Newt Lukes.

## 2011-04-03 ENCOUNTER — Encounter: Payer: Self-pay | Admitting: Ophthalmology

## 2011-04-03 ENCOUNTER — Ambulatory Visit (INDEPENDENT_AMBULATORY_CARE_PROVIDER_SITE_OTHER): Payer: Self-pay | Admitting: Ophthalmology

## 2011-04-03 ENCOUNTER — Ambulatory Visit (INDEPENDENT_AMBULATORY_CARE_PROVIDER_SITE_OTHER): Payer: Self-pay | Admitting: Dietician

## 2011-04-03 ENCOUNTER — Other Ambulatory Visit (HOSPITAL_COMMUNITY): Payer: Self-pay | Admitting: Ophthalmology

## 2011-04-03 DIAGNOSIS — G5600 Carpal tunnel syndrome, unspecified upper limb: Secondary | ICD-10-CM

## 2011-04-03 DIAGNOSIS — E119 Type 2 diabetes mellitus without complications: Secondary | ICD-10-CM

## 2011-04-03 DIAGNOSIS — R1310 Dysphagia, unspecified: Secondary | ICD-10-CM | POA: Insufficient documentation

## 2011-04-03 DIAGNOSIS — G5603 Carpal tunnel syndrome, bilateral upper limbs: Secondary | ICD-10-CM

## 2011-04-03 DIAGNOSIS — I10 Essential (primary) hypertension: Secondary | ICD-10-CM

## 2011-04-03 LAB — GLUCOSE, CAPILLARY: Glucose-Capillary: 319 mg/dL — ABNORMAL HIGH (ref 70–99)

## 2011-04-03 MED ORDER — GABAPENTIN 300 MG PO CAPS: ORAL_CAPSULE | ORAL | Status: DC

## 2011-04-03 NOTE — Assessment & Plan Note (Signed)
The patients blood pressure was within reasonable control today (BP: 134/92 mmHg ) and I will not make any adjustments to the patients anti-hypertensive regimen. I will continue to monitor and titrate the patients medications as needed at future visits.

## 2011-04-03 NOTE — Patient Instructions (Signed)
I am going to have you do a barium swallow to evaluate your difficulty swallowing. Please followup with the GI doctors on May 9. Please return here in one month for followup of her diabetes. If you develop any low blood sugars, please call the clinic

## 2011-04-03 NOTE — Assessment & Plan Note (Signed)
I will increase the patient's Lantus dose to 37 units today given her CBGs of 2- 300 in the a.m. I instructed the patient to call the clinic if she develops any hypoglycemia. I also instructed the patient to eat a nighttime snack to try to decrease her need to wake up in the middle of the night to eat, the patient is to drink Crystal light at night if she needs to wake up.

## 2011-04-03 NOTE — Progress Notes (Signed)
Diabetes Self-Management Training (DSMT)  Follow-Up 4 Visit  04/03/2011 Ms. Kizzie Ide, identified by name and date of birth, is a 55 y.o. female with Type 2 Diabetes. Year of diabetes diagnosis:  Other persons present:no  ASSESSMENT Patient concerns are Glycemic control and Weight control.Problem solving  Last menstrual period 02/15/2009. There is no height or weight on file to calculate BMI. Lab Results  Component Value Date   LDLCALC 51 06/19/2010   Lab Results  Component Value Date   HGBA1C 10.7 02/16/2011   Medication Nutrition Monitor: uses ones that are given to her Labs reviewed.  DIABETES BUNDLE: A1C in past 6 months? Yes.  Less than 7%? No LDL in past year? no Microalbumin ratio in past year? No. Patient taking ACE or ARB? no. Blood pressure less than 130/80? No.  Foot exam in last year? Yes. Eye exam in past year? Yes. Tobacco use? No. Pneumovax? No Flu vaccine? Yes Asprin? Yes  Family history of diabetes: Yes Support systems: friends and family Special needs: None Prior DM Education: Yes Patients belief/attitude about diabetes: Diabetes can be controlled.  Self foot exams daily: Yes  Diabetes Complications: Neuropathy   Medications See Medications list.  Has adequate knowledge and Is interested in learning more   Exercise Plan Doing ADLs and sendentary work for 5 minutesa day.   Self-Monitoring Frequency of testing: 2-3 times/day Breakfast: 101- 300s- mostly high in morning- Lunch:100-200 Supper: 100-200 mostly under 150 Bedtime: does not check- have asked her to a few times a week Hyperglycemia: Yes Daily Hypoglycemia: No   Meal Planning Some knowledge and Interested in improving   Assessment comments: Patient reports night eating for unknown reasons. Awakens to use the bathroom    INDIVIDUAL DIABETES EDUCATION PLAN:  Nutrition management Physical activity and  exercise Monitoring _______________________________________________________________________  Intervention TOPICS COVERED TODAY:  Nutrition management  helped patient problem solve regarding night eating Physical activity and exercise  Role of exercise on diabetes management, blood pressure control and cardiac health. Helped patient identify appropriate exercises in relation to his/her diabetes, diabetes complications and other health issue. Monitoring  Purpose and frequency of SMBG.  PATIENTS GOALS/PLAN (copy and paste in patient instructions so patient receives a copy): 1.  Learning Objective:       See patient instructions 2.  Behavioral Objective:         Nutrition: To improve blood glucose control I will adjust nocturnal carb intake Never 0% Monitoring: To identify blood glucose trends, I will test blood glucose before bed, 3 a.m. and fasting Never 0% Problem Solving: To improve my blood glucose control, I will see patient instructions  Never 0%  Personalized Follow-Up Plan for Ongoing Self Management Support:  [place], friends, family and CDE visits ______________________________________________________________________   Outcomes Expected outcomes: Demonstrated interest in learning.Expect positive changes in lifestyle.  Self-care Barriers: Lack of material resources, Coping skills  Education material provided: Loaned pedometer to track activity x 2 weeks  Patient to contact team via Phone if problems or questions.  Time in: 1130     Time out: 1230  Future DSMT - 2 wks   RILEY,DONNA

## 2011-04-03 NOTE — Assessment & Plan Note (Signed)
The patient continues to have pain, and is intolerant to wrist splints. I will start the patient on Neurontin today, until she is evaluated for surgery.

## 2011-04-03 NOTE — Assessment & Plan Note (Signed)
This is a new problem, over the last 2 months, and is primarily to solid foods. I will get a barium swallow now for further evaluation, and we'll have the patient followup with her GI physician on May 9. The patient will likely need upper endoscopy.

## 2011-04-03 NOTE — Patient Instructions (Signed)
Please check mid sleep blood sugar ( between 2-4 am) in the next day or so and ANY time your lantus dose is increased. ( Call is this blood sugar is lower than 90.  Please check blood sugar before bed at least every other day. If less than 120 please have a snack.  We talked about getting  Crystal Lite and making up a pitcher to put in the front of yo                 ur refrigerator so that when you get up hungry at night- this will be the first thing you see.   Please stop drinking liquids after 7 PM at night.   Please stop drinking caffeinated drinks like the diet green tea after 5 Pm at night.   Make a follow up in 2-3 weeks.  Have fun with the pedometer!!!!!!!!!

## 2011-04-03 NOTE — Progress Notes (Signed)
Subjective:    Patient ID: Nancy [name], female    DOB: [birth year], 55 y.o.   MRN: [id number]  HPI  This is a 55 year old female with a past medical history significant for hypertension, diabetes, and carpal tunnel syndrome, who presents for followup on her diabetes. Patient's last hemoglobin A1c was in 3/12 and was 10.7. The patient's diabetes appears to have almost always been poorly controlled. Regards to her carpal tunnel syndrome, I did refer her to neurosurgery/orthopedic consultation at our last visit. Since her last visit, the patient has been checking her blood sugars regularly, and has recorded them on paper. The patient's CBGs have been under reasonable control at noon and night in the primarily 100-200 range however her CBGs tend to be in the 2-300 range in the a.m. The patient does admit to snacking during the night but states that she only eats about a piece of fruit. The patient continues to have pain secondary to her carpal tunnel syndrome, and now also admits to increasing dysphagia over the last 2 months. The patient states that she feels like food gets stuck in her lower esophagus, and is associated with mild epigastric pain. The patient has a history of GERD, and is currently on Nexium 40 twice a day. The patient also continues to have mucus in her stools, and is scheduled to see GI on May 9. The patient denies any dark stools or blood in her stool. For her diabetes, the patient is currently taking 30 units of Lantus in the a.m., and 7 units of NovoLog with each meal.  Review of Systems  Constitutional: Negative for fever and chills.  Respiratory: Negative for cough and shortness of breath.   Cardiovascular: Negative for chest pain and palpitations.  Gastrointestinal: Negative for vomiting, diarrhea and constipation.       Objective:   Physical Exam  Constitutional: She appears well-developed and well-nourished.  HENT:  Head: Normocephalic and atraumatic.  Eyes: Pupils  are equal, round, and reactive to light.  Cardiovascular: Normal rate, regular rhythm and intact distal pulses.  Exam reveals no gallop and no friction rub.   No murmur heard. Pulmonary/Chest: Effort normal and breath sounds normal. She has no wheezes. She has no rales.  Abdominal: Soft. Bowel sounds are normal. She exhibits no distension. There is no tenderness.       The patient has mild epigastric tenderness, but normal bowel sounds and no guarding or rigidity  Musculoskeletal: Normal range of motion.  Neurological: She is alert. No cranial nerve deficit.  Skin: No rash noted.       Current Outpatient Prescriptions on File Prior to Visit  Medication Sig Dispense Refill  . aspirin 81 MG EC tablet Take 81 mg by mouth daily.        Marland Kitchen atorvastatin (LIPITOR) 20 MG tablet Take 20 mg by mouth daily.        Marland Kitchen esomeprazole (NEXIUM) 40 MG capsule Take 40 mg by mouth 2 (two) times daily.        Marland Kitchen glucose blood (TRUETRACK TEST) test strip Use to test blood sugar 4 times a day before meals and bedtime       . hydrochlorothiazide 25 MG tablet Take 25 mg by mouth daily.        . insulin aspart (NOVOLOG FLEXPEN) 100 UNIT/ML injection Inject into the skin. 7 units and self-titrate dose to achieve target goal; 10 minutes before each meal 3 times a day. If you skip a meal, skip the injection.      Marland Kitchen  insulin glargine (LANTUS) 100 UNIT/ML injection Inject 37 Units into the skin every morning. When you wake up      . Insulin Pen Needle (RELION MINI PEN NEEDLES) 31G X 6 MM MISC Use to take insulin injections 3 times a day before meals       . Lancets MISC Use to check blood sugar 4 times a day       . metFORMIN (GLUCOPHAGE) 1000 MG tablet Take 1,000 mg by mouth 2 (two) times daily. With breakfast and evening meal         Past Medical History  Diagnosis Date  . Diabetic peripheral neuropathy   . Hand pain     bilateral  . Hyperlipidemia   . Diabetes mellitus   . GERD (gastroesophageal reflux disease)     . Heat edema   . History of Helicobacter infection 11/07  . History of chest pain     negative cardiolite September 2007.  ETT-myoview 12/10 with EF 76%, no ischemia,   . History of endometriosis      Assessment & Plan:

## 2011-04-06 ENCOUNTER — Other Ambulatory Visit (HOSPITAL_COMMUNITY): Payer: Self-pay | Admitting: Internal Medicine

## 2011-04-09 ENCOUNTER — Ambulatory Visit (HOSPITAL_COMMUNITY)
Admission: RE | Admit: 2011-04-09 | Discharge: 2011-04-09 | Disposition: A | Discharge status: Home / Self Care | Payer: Self-pay | Source: Ambulatory Visit | Attending: Internal Medicine | Admitting: Internal Medicine

## 2011-04-09 ENCOUNTER — Other Ambulatory Visit (HOSPITAL_COMMUNITY): Payer: Self-pay

## 2011-04-09 ENCOUNTER — Other Ambulatory Visit (HOSPITAL_COMMUNITY): Payer: Self-pay | Admitting: Internal Medicine

## 2011-04-09 ENCOUNTER — Ambulatory Visit (HOSPITAL_COMMUNITY)
Admission: RE | Admit: 2011-04-09 | Discharge: 2011-04-09 | Disposition: A | Discharge status: Home / Self Care | Payer: Self-pay | Source: Ambulatory Visit | Attending: Ophthalmology | Admitting: Ophthalmology

## 2011-04-09 ENCOUNTER — Other Ambulatory Visit: Payer: Self-pay | Admitting: Internal Medicine

## 2011-04-09 DIAGNOSIS — K219 Gastro-esophageal reflux disease without esophagitis: Secondary | ICD-10-CM

## 2011-04-09 DIAGNOSIS — R6889 Other general symptoms and signs: Secondary | ICD-10-CM | POA: Insufficient documentation

## 2011-04-09 DIAGNOSIS — R1310 Dysphagia, unspecified: Secondary | ICD-10-CM | POA: Insufficient documentation

## 2011-04-15 ENCOUNTER — Other Ambulatory Visit: Payer: Self-pay

## 2011-04-15 ENCOUNTER — Encounter: Payer: Self-pay | Admitting: Gastroenterology

## 2011-04-15 ENCOUNTER — Ambulatory Visit (INDEPENDENT_AMBULATORY_CARE_PROVIDER_SITE_OTHER): Payer: Self-pay | Admitting: Gastroenterology

## 2011-04-15 VITALS — BP 148/96 | HR 80 | Ht <= 58 in | Wt 183.0 lb

## 2011-04-15 DIAGNOSIS — R195 Other fecal abnormalities: Secondary | ICD-10-CM

## 2011-04-15 NOTE — Patient Instructions (Signed)
Please start taking citrucel (orange flavored) powder fiber supplement.  This may cause some bloating at first but that usually goes away. Begin with a small spoonful and work your way up to a large, heaping spoonful daily over a week. You will have labs checked today in the basement lab.  Please head down after you check out with the front desk  (stools test for ova, parasites, worms). Please stay on the fiber every day for 4 weeks, call Dr. Ardis Hughs office to report on your symptoms.  If no improvement, will have to consider sigmoidoscopy.

## 2011-04-15 NOTE — Progress Notes (Signed)
Review of pertinent gastrointestinal problems: 1. tubular adenoma, colonoscopy January 2010, this was less than 1 cm. Recall colonoscopy at 5 year interval   HPI: This is a very pleasant  55 year old woman who is here with a friend of hers today.  She feels abdominal pains about 3 months ago, generalized abdominal discomfort.  Sees "cloth" like subtance coming out with her stools.  With nearly every BM.  No blood in stools.  Has  Been a bit constipated.  She has had to pull the substance out of her bottom.  She brought a sample to her PCP, he told her it was mucous and she should come here.  I found records in the electronic medical record system showing oval parasite exam done and the result was simply mucous.     Physical Exam: BP 148/96  Pulse 80  Ht 4\' 9"  (1.448 m)  Wt 183 lb (83.008 kg)  BMI 39.60 kg/m2  LMP 02/15/2009 Constitutional: generally well-appearing Psychiatric: alert and oriented x3 Abdomen: soft, nontender, nondistended, no obvious ascites, no peritoneal signs, normal bowel sounds    Assessment and plan: 55 y.o. female with change in bowel habits, mucous discharge, question parasite or worm  If this is actually just mucus which I suspect it is, adding fiber to her diet will likely help. We'll send her stool for ova parasite, worm examination as well. She will call to report on her symptoms in 4 weeks if she has not improved with fiber supplements and I will likely proceed with flexible sigmoidoscopy.

## 2011-04-20 ENCOUNTER — Other Ambulatory Visit: Payer: Self-pay

## 2011-04-22 ENCOUNTER — Other Ambulatory Visit: Payer: Self-pay | Admitting: *Deleted

## 2011-04-22 DIAGNOSIS — I10 Essential (primary) hypertension: Secondary | ICD-10-CM

## 2011-04-23 ENCOUNTER — Other Ambulatory Visit: Payer: Self-pay | Admitting: *Deleted

## 2011-04-23 MED ORDER — HYDROCHLOROTHIAZIDE 25 MG PO TABS: 25.0000 mg | ORAL_TABLET | Freq: Every day | ORAL | Status: DC

## 2011-04-23 MED ORDER — INSULIN ASPART 100 UNIT/ML ~~LOC~~ SOLN: 7.0000 [IU] | Freq: Three times a day (TID) | SUBCUTANEOUS | Status: DC

## 2011-04-23 MED ORDER — METFORMIN HCL 1000 MG PO TABS: 1000.0000 mg | ORAL_TABLET | Freq: Two times a day (BID) | ORAL | Status: DC

## 2011-04-23 MED ORDER — LISINOPRIL 20 MG PO TABS: 20.0000 mg | ORAL_TABLET | Freq: Every day | ORAL | Status: DC

## 2011-04-24 NOTE — Discharge Summary (Signed)
Nancy [name], Nancy [name]             ACCOUNT NO.:  [id number]   MEDICAL RECORD NO.:  [id number]          PATIENT TYPE:  INP   LOCATION:  9315                          FACILITY:  [facility]   PHYSICIAN:  Phil D. Kalman Shan, M.D.     DATE OF BIRTH:  1956/01/17   DATE OF ADMISSION:  02/01/2006  DATE OF DISCHARGE:                                 DISCHARGE SUMMARY   The patient, a 55 year old black female, on the day of admission underwent  total abdominal hysterectomy, bilateral salpingo-oophorectomy for chronic  pelvic pain, pelvic adhesions and endometriosis. The patient has done  extremely well postoperatively, had been afebrile. Her only problem has been  a slight ileus which seems to have resolved on the day of discharge, the  abdomen has become much softer. Wound is clean, lungs are clear, heart is no  murmur and normal sinus rhythm. Vital signs have been stable. Her  extremities were negative. She is going to have her staples removed before  discharge. She is being discharged on Percocet, Motrin, and Slow Fe. Her  discharge hemoglobin is 9.7, which is not surprising considering the blood  loss during surgery, but she has not had any sign of dizziness or  orthostatic hypotension. She is going to return to the clinic in 2 weeks.  She has been given detailed instructions as to activity with heavy lifting  and stairs, and we do not have a pathology report is yet at the time of  discharge.   Discharge diagnosis is pending pathology report.           ______________________________  Franchot Heidelberg. Kalman Shan, M.D.     PDR/MEDQ  D:  02/04/2006  T:  02/04/2006  Job:  [id number]

## 2011-04-24 NOTE — Group Therapy Note (Signed)
NAMEASLEY, MAPA NO.:  [id number]   MEDICAL RECORD NO.:  [id number]          PATIENT TYPE:  WOC   LOCATION:  [facility]                   FACILITY:  [facility]   PHYSICIAN:  Andrew Au, MD        DATE OF BIRTH:  March 05, 1956   DATE OF SERVICE:  11/18/2004                                    CLINIC NOTE   REASON FOR VISIT:  The patient is a 55 year old nulligravida black female  with chronic epigastric and pelvic pain with dysmenorrhea.  Has been  recently on Ortho-Novum Tri-Cyclen Lo with increased clots with her period.  She had had many years ago endoscopy for her epigastric pain and she has no  idea of the results although at [facility] they did put her on some  medication to control the acid reflux.  What medication she is not aware of.  As far as her pelvic pain, it is constant but much more intolerable during  her periods with heavier periods recently.  Not having had children makes  adenomyosis very unlikely.  On physical examination, her abdomen is soft,  flat, more tender to touch according to her but without guarding or rebound  and no organomegaly noted.  The patient in addition to the above is a poorly  diet-controlled diabetic on Glucagon.  She also takes blood pressure meds  and reflux meds and Darvocet for pain.  Recent ultrasound shows a resolution  of a left hemorrhagic cyst which was there for a previous study.  The uterus  is pretty much normal on ultrasound and she has a simple right ovarian cyst  probably physiologic although the ultrasound does make a notation that she  could have adenomyosis.   PLAN:  My plan is to do a diagnostic laparoscopic, hysteroscopy and D&C to  see if we cannot find something to control this lady's pain up until  menopause and she is 55 years old rather than put her through a  hysterectomy.  A CBC, Helicobacter will be drawn.  Complete metabolic panel  was normal with the exception of significantly high glucose at  355.   IMPRESSION:  Chronic epigastric pain with gastroesophageal reflux disease,  pelvic pain with dysmenorrhea and menorrhagia.      PR/MEDQ  D:  11/18/2004  T:  11/18/2004  Job:  [id number]

## 2011-04-24 NOTE — Group Therapy Note (Signed)
NAMEOHEMAA, COONER NO.:  [id number]   MEDICAL RECORD NO.:  [id number]          PATIENT TYPE:  WOC   LOCATION:  [facility]                   FACILITY:  [facility]   PHYSICIAN:  Andrew Au, MD        DATE OF BIRTH:  [birth year]   DATE OF SERVICE:  01/27/2005                                    CLINIC NOTE   The patient is a 55 year old black female who underwent laparoscopic  examination, dilatation and curettage, and hysteroscopic examination on the  9th of January 2006 and found to have severe endometriosis with severe  pelvic adhesions.  Was treated with Depo Lupron on the 2nd of February and  will need a second dose in early June.  Comes in with a drainage from the  umbilical incision which was cleaned out and treated with silver nitrate, a  small foreign body removed.  I think this was just a stitch abscess and the  patient will return in one week for check-up.      PR/MEDQ  D:  01/27/2005  T:  01/27/2005  Job:  [id number]

## 2011-04-24 NOTE — Op Note (Signed)
[name], [name]             ACCOUNT NO.:  [id number]   MEDICAL RECORD NO.:  [id number]          PATIENT TYPE:  INP   LOCATION:  9315                          FACILITY:  [facility]   PHYSICIAN:  Phil D. Kalman Shan, M.D.     DATE OF BIRTH:  07/16/1956   DATE OF PROCEDURE:  02/01/2006  DATE OF DISCHARGE:                                 OPERATIVE REPORT   PROCEDURE:  Total abdominal hysterectomy, bilateral salpingo-oophorectomy  and loss lysis of pelvic adhesions.   PREOPERATIVE DIAGNOSES:  1.  Pelvic pain.  2.  Endometriosis.  3.  Pelvic adhesions.   POSTOPERATIVE DIAGNOSES:  1.  Pelvic pain.  2.  Endometriosis.  3.  Pelvic adhesions.   SURGEON:  Franchot Heidelberg. Kalman Shan, M.D.   FIRST ASSISTANT:  Guss Bunde, M.D.   PATHOLOGY SPECIMENS:  Uterus, tubes and ovaries.   ANESTHESIA:  General.   ESTIMATED BLOOD LOSS:  500 mL.   POSTOPERATIVE CONDITION:  Satisfactory.   OPERATIVE FINDINGS:  On entering the peritoneal cavity, massive adhesions  from the posterior portion of the uterus to the posterior pelvic wall were  found to be present which held the uterus down into a somewhat retroverted  position.  Both ovaries were adherent to the walls of the uterus in the area  of the uterine vessels.  Upper abdominal viscera was generally within normal  limits.  The uterus which for the most part regular in configuration with  small subserosal fibroid noted near the corneal region of the right tube.  Both tubes had been resected at the previous tubal ligation.  The area of  the bladder was scarred over what looked like old endometriosis.   DESCRIPTION OF PROCEDURE:  Under satisfactory general anesthesia, the  patient in dorsal supine position after a Foley catheter had bee placed in  the urinary bladder and the vagina prepped, the abdomen was prepped and  draped in the usual sterile manner and entered through a transverse  suprapubic incision extending for a total length of 16 cm situated 2 cm  above the symphysis pubis.  It was entered.  I made a large incision where  the rectus muscles were transected transversely and the peritoneum opened  transversely.  On entering the peritoneal cavity, the above findings were  noted.  The pelvic adhesions were broken up by sharp and blunt dissection  from the cul-de-sac, freeing up the uterus and then the adhesions to the  ovaries were broken up as much as possible by sharp dissection freeing up  the ovaries to a great extent.  The round ligaments were then ligated with 1  chromic catgut suture ligatures, divided and openings made in the avascular  portions of broad ligament through which was brought a free tie and tied  around the infundibular pelvic ligaments.  A Heaney clamp was then placed  medially in the aforementioned tie and the tissue medially was divided and  then the lateral pedicle ligated with one chromic catgut suture ligatures.  The uterine vessels were skeletonized, doubly clamped, divided and ligated  with #1 chromic catgut suture ligatures.  The bladder  was pushed away from  the anterior surface of the cervix prior to clamping of the uterine vessels  by sharp and blunt dissection.  Then the cardinal ligaments were then  grasped with straight Kocher clamps, divided and ligated with 1 chromic  catgut suture ligatures.  The uterus was cut away from the apex of the  cervix to make the surgery easy up the adhesions and once this was done the  adhesions were further broken up by sharp and blunt dissection and the  cervix dissected away from the apex of the vagina by sharp dissection.  Angle sutures of one chromic were placed into the lateral vaginal cuff  angles and figure-of-eight was used to close the vaginal cuff using 1  chromic catgut.  The areas were observed for bleeding.  There were several  small oozers in the area where the adhesions had been broken up bluntly in  the cul-de-sac and in the lateral areas of the pelvis.   These were  controlled by  hot cautery or 2-0 chromic ties.  The large bowel was placed  over the pelvic suture line  after the pelvis had been irrigated and no  bleeding noted.  The peritoneum was then closed with continuous running 0  chromic on an atraumatic needle.  The fascia was closed with a continuous  running 0 Vicryl on an atraumatic needle.  Subcutaneous bleeders controlled  with hot cautery and skin staples used for skin edge approximation. Dry  sterile dressing was applied.  Tape, instrument, sponge and needle reported  correct at the end of the procedure, and the patient was transferred to the  recovery room in satisfactory condition having tolerated the procedure well.           ______________________________  Franchot Heidelberg. Kalman Shan, M.D.     PDR/MEDQ  D:  02/01/2006  T:  02/02/2006  Job:  [id number]

## 2011-04-24 NOTE — Group Therapy Note (Signed)
Nancy [name], ORLIKOWSKI NO.:  [id number]   MEDICAL RECORD NO.:  [id number]          PATIENT TYPE:  WOC   LOCATION:  [facility]                   FACILITY:  [facility]   PHYSICIAN:  Andrew Au, MD        DATE OF BIRTH:  [birth year]   DATE OF SERVICE:                                    CLINIC NOTE   The patient is a 55 year old black female, nulligravida, weighing 170 and 4  foot 9 inches tall, who underwent laparoscopic examination with a  hysteroscopy and D&C one year ago for chronic pelvic pain and incapacitating  dysmenorrhea.   POSTOPERATIVE DIAGNOSIS:  Severe endometriosis with severe pelvic adhesions.   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  She  desires definitive therapy.  We have talked the options and has opt for  abdominal hysterectomy with bilateral salpingo-oophorectomy and then  postoperative estrogen treatment.  The patient is in fairly good health,  although she is a diabetic on insulin, Novolin 10 units in the morning and  10 h.s.  She also takes Zocor, Bayer aspirin, HCTZ, lisinopril 20, and  metformin 500.  The patient is going to be scheduled for total abdominal  hysterectomy.  We have gone over with her the procedure.  We have discussed  the possible complications, especially those related to anesthesia, injury  to bowel and blood vessel, hemorrhage, urinary tract injuries, and operative  and postoperative infection.  We are going to have the patient see [place] again  prior to surgery in the week prior to surgery and reiterate these, as well  as discuss the postoperative restrictions.   IMPRESSION:  Intractable chronic pelvic pain with dysmenorrhea.           ______________________________  Andrew Au, MD     PR/MEDQ  D:  12/10/2005  T:  12/10/2005  Job:  [id number]

## 2011-04-24 NOTE — H&P (Signed)
Nancy [name], Nancy [name]             ACCOUNT NO.:  [id number]   MEDICAL RECORD NO.:  [id number]          PATIENT TYPE:  INP   LOCATION:  NA                            FACILITY:  [facility]   PHYSICIAN:  Phil D. Kalman Shan, M.D.     DATE OF BIRTH:  [birth year]   DATE OF ADMISSION:  02/01/2006  DATE OF DISCHARGE:                                HISTORY & PHYSICAL   CHIEF COMPLAINT:  Chronic pelvic pain, severe dysmenorrhea.   HISTORY OF PRESENT ILLNESS:  The patient is a 55 year old black female who  has complained for over a year of severe pelvic pain.  In January 2006 she  had D&C, hysteroscopy and a laparoscopic examination, which showed severe  pelvic adhesions and fibroid uterus with endometriosis.  The patient was  treated subsequently with Lupron Depot for a six-month period without  significant relief.  She is back asking for relief and decides that she is  going to undergo total abdominal hysterectomy and bilateral salpingo-  oophorectomy and lysis of adhesions.  We spent time with the patient and her  mother discussing the possible complications, especially those related to  hemorrhage, infection, urinary tract and bowel injuries, and we are ordering  a bowel prep for the patient and we explained why.  The patient is in good  health, although she is a diabetic on insulin.   PREVIOUS SURGERY:  The patient has had a laparoscopy, hysteroscopy, D&C.   MEDICAL HISTORY:  She has hypertension, hypercholesterolemia and insulin-  dependent diabetes type 1.   ALLERGIES:  The patient has no known allergies.   MEDICATIONS:  1.  Metformin 500 mg b.i.d.  2.  Lisinopril 20 mg q.a.m.  3.  Hydrochlorothiazide 25 mg q.a.m.  4.  One baby aspirin q.a.m.  5.  Novolin N Humulin insulin 10 units b.i.d.  6.  Glyburide 10 mg q.a.m.   SOCIAL HISTORY:  The patient does not smoke, drink alcohol at all since she  has become a diabetic, and takes no street drugs.   REVIEW OF SYSTEMS:  The patient has some  blurriness of vision when her sugar  is out of control, but she has not had eye examination recently.  The  patient has dental problems and needs teeth extractions, has put it off  because she does not have the money for dentures.  CARDIOVASCULAR:  The  patient has no cardiovascular complaints.  LUNGS:  The patient has no  dyspnea, shortness of breath or difficulties otherwise.  GASTROINTESTINAL:  The patient has no GERD.  Has occasional constipation but no significant GI  problems.  URINARY:  The patient complains of no urinary symptoms.  EXTREMITIES:  The patient complains of knee pain, which she has had since  she has developed diabetes.   PHYSICAL EXAMINATION:  VITAL SIGNS:  The patient is 4 feet 9 inches and  weighs 170 pounds.  Blood pressure 120/83, has a pulse of 95 and a  temperature of 97.8.  GENERAL:  She is a well-developed, slightly obese black female in no acute  distress.  HEENT:  Within normal limits with the exception  of poor dentition.  NECK:  Supple with no thyroid masses.  BREASTS:  Symmetrical with no dominant masses, no nipple discharge.  BACK:  Erect.  LUNGS:  Clear to auscultation and percussion.  CARDIAC:  No murmur, normal sinus rhythm.  ABDOMEN:  Soft, flat, not significantly tender, with a slight mass palpable  above the symphysis.  No CVA tenderness, no rebound and no guarding.  EXTREMITIES:  No edema, no varices.  DTRs within normal limits.  RECTAL:  No masses.  GENITALIA:  External genitalia is normal.  BUS within normal limits.  The  vagina is clean, well-rugated.  The cervix is clean and parous.  The uterus  is enlarged, approximately twice normal size.  EXTREMITIES:  The extremities could not be palpated because of the habitus  of the patient.   PLAN:  Total abdominal hysterectomy with bilateral salpingo-oophorectomy.  The patient has been given a bowel prep.  We suggested a Fleet's enema on  the night prior to surgery and clear liquids on the day  prior to surgery.  She has been given a sheet with diabetic instructions to start on the night  prior to surgery, and she is going to call her internist for any other  recommendations.   DIAGNOSIS:  Intractable pelvic pain.           ______________________________  Franchot Heidelberg. Kalman Shan, M.D.     PDR/MEDQ  D:  01/27/2006  T:  01/27/2006  Job:  [id number]

## 2011-04-24 NOTE — Op Note (Signed)
NAMELAMYIAH, Nancy [name]             ACCOUNT NO.:  [id number]   MEDICAL RECORD NO.:  [id number]          PATIENT TYPE:  WOC   LOCATION:  WOC                          FACILITY:  [facility]   PHYSICIAN:  Phil D. Kalman Shan, M.D.     DATE OF BIRTH:  1956/03/29   DATE OF PROCEDURE:  12/15/2004  DATE OF DISCHARGE:  11/18/2004                                 OPERATIVE REPORT   PREOPERATIVE DIAGNOSIS:  Chronic pelvic pains, incapacitating dysmenorrhea  and menorrhagia.   POSTOPERATIVE DIAGNOSIS:  Severe endometriosis with severe pelvic adhesions.   OPERATION PERFORMED:  Dilation and curettage, hysteroscopic examination and  diagnostic laparoscopy.   SURGEON:  Franchot Heidelberg. Kalman Shan, M.D.   ANESTHESIA:  General with incisional local.   ESTIMATED BLOOD LOSS:  Minimal.   POSTOPERATIVE CONDITION:  Satisfactory.   OPERATIVE FINDINGS:  On hysteroscopic examination, the endometrial cavity  seemed to be completely normal with normal fluffiness one would see in  midcycle.  No signs of adenomyosis, no leiomyomata uteri.  Laparoscopic  examination, the pelvis was filled with thick, adherent and filmy adhesions  to the uterus and to the cul-de-sac.  The cul-de-sac was blocked and could  not be entered.  The adnexa could not be visualized with the diagnostic  scope because of the thick adhesions.  The uterus was about twice normal  size for a nulliparous patient and the right cornual area of the uterus was  bulging and blue in color with multiple areas of endometriosis showing  through the wall of the uterus in the area of the right cornual area.  Neither tube nor ovary was well visualized.  The laparoscope was removed and  on removal, a sleeve was brought up above the peritoneum where much of the  CO2 was trapped within the peritoneal cavity and had to wait for  reabsorption.   DESCRIPTION OF PROCEDURE:  Under satisfactory general anesthesia, with the  patient in dorsal lithotomy position, the perineum and vagina  and abdomen  were prepped and draped in the usual sterile fashion.  Bimanual pelvic  examination revealed a uterus of approximately [redacted] weeks gestational size  markedly irregular in configuration with a blockage into the cul-de-sac.  Adnexa could not be outlined because of the habitus of the patient.  Weighted speculum was placed in the posterior fourchette of the vagina.  __________ and BUS were within normal limits.  The vagina was cleaned and  water irrigated and the cervix was clean.  Parous cervix, clean and  nulliparous.  A weighted speculum was placed in the posterior fourchette of  the vagina.  Anterior lip of the cervix was grasped with a single toothed  tenaculum.  Uterine cavity was sounded to a depth of 8 cm.  Cervical os  dilated to a #7 Hegar dilator and a 30-degree  hysteroscope inserted into  the uterine cavity using normal saline to dilate the medium and the uterine  cavity explored and the findings were as above.  The uterine cavity was then  curetted with a small serrated curette and the specimen sent for  pathological diagnosis.  A 1 cm transverse  incision was made in the area of  the umbilicus just below the umbilicus and attempted insertion of Veress  needle and the scope was not successful thought to possibly be due to thick  adhesions. Because of the depth of the area to the peritoneum, I could not  get in to the peritoneal cavity at that area.  I was able to manipulate the  uterus up towards the anterior wall of the abdomen and palpate through the  opening that I had created just below the umbilicus and with that, I aimed  the trocar at the cul-de-sac and was able to put the scope into the  peritoneal cavity.  Area was observed for injury to the bowel or viscus and  none was noted.  The incision was closed with 2-0 Vicryl suture which was  run up to a subcuticular closure.  Dry sterile dressing was applied.  The  tenaculums were removed from the vagina and the  patient transferred to  recovery room in satisfactory condition having tolerated the procedure well.  My plan in this patient is to give  her Depo-Lupron for three to six months  and then probably proceed with hysterectomy if the symptoms recur.      PDR/MEDQ  D:  12/15/2004  T:  12/15/2004  Job:  [id number]

## 2011-04-24 NOTE — Group Therapy Note (Signed)
NAMEFLETCHER, SACCOMANNO NO.:  [id number]   MEDICAL RECORD NO.:  [id number]          PATIENT TYPE:  WOC   LOCATION:  [facility]                   FACILITY:  [facility]   PHYSICIAN:  Nancy Au, MD        DATE OF BIRTH:  1956-03-14   DATE OF SERVICE:  10/28/2004                                    CLINIC NOTE   CHIEF COMPLAINT:  Follow up abdominal pain.   SUBJECTIVE:  Nancy [name] is a new patient to the clinic today.  Briefly, she  is a 55 year old G0 P0 who was seen at [facility] for abdominal pain.  She  had both an abdominal CT and ultrasound completed.  Her ultrasound was more  reliable than her CT, showing a complex left ovarian cyst, hemorrhagic  versus TOA.  The patient is currently complaining of generalized lower  abdominal pain.  She states she is having terrible dysmenorrhea for the past  year, ever since she was diagnosed with diabetes.  Her menses have been much  heavier.  She has been taking 1400 mg of ibuprofen between b.i.d. and t.i.d.  although she knows she should not.  She denies any vaginal discharge.   GYNECOLOGICAL HISTORY:  She began menses at age 42.  She states she has  monthly cycle that last between 3 to 7 days and are usually heavy.  She is  not on any type of birth control.  As far as never being pregnant, she  states she had tried in the past but never received assistance and was never  able to get pregnant.  She is not sure when her last Pap smear was but they  have always been normal.  She states her last mammogram was in 2005 and was  normal.   SURGICAL HISTORY:  Negative.   FAMILY HISTORY:  Her grandparents have diabetes, high blood pressure, and  cancer.   PAST MEDICAL HISTORY:  Osteoarthritis, high blood pressure, and diabetes.   SOCIAL HISTORY:  She does work.  She does not smoke or drink alcohol.  Denies any abuse or illicit drug use.   REVIEW OF SYSTEMS:  As noted in her gynecological history form.   OBJECTIVE:  VITAL  SIGNS:  Noted.  GENERAL:  She is an overweight female in no acute distress.  ABDOMEN:  Soft and benign, no hepatosplenomegaly noted.  PELVIC:  Reveals normal external female genitalia, normal urethra and  vaginal wall.  Cervix appears nulliparous.  There is some scant white  discharge in the posterior fornix.  Bimanual exam reveals a non-pregnant-  size uterus with nonpalpable adnexa bilaterally.   ASSESSMENT AND PLAN:  1.  Lower abdominal pain.  This does seem consistent with dysmenorrhea from      fibroids, especially given the fact that she has had some menorrhagia.      Will go ahead and start cycling her on oral contraceptives and she has      been given two samples of Ortho Tri-Cyclen Lo.  She does not smoke and I      told her she should not start while on the birth control pill.  I have      given her a prescription for Darvocet-N 100 #30 no refills.  2.  Complex cyst.  We set her up for another ultrasound on November 28.  If      that is still present, she will probably need a laparoscope to correctly      diagnosis her ovarian lesion.   She is to return in 2-3 weeks for follow-up.      LC/MEDQ  D:  10/28/2004  T:  10/28/2004  Job:  [id number]

## 2011-05-01 ENCOUNTER — Encounter: Payer: Self-pay | Admitting: Internal Medicine

## 2011-05-01 NOTE — Telephone Encounter (Signed)
Faxed to [facility] 

## 2011-05-08 ENCOUNTER — Encounter: Payer: Self-pay | Admitting: Internal Medicine

## 2011-05-13 ENCOUNTER — Telehealth: Payer: Self-pay | Admitting: Dietician

## 2011-05-13 NOTE — Telephone Encounter (Signed)
Called patient to follow up. Discussed progress with goals from last visit. Patient to schedule follow up same day as next doctor appointment.

## 2011-05-25 ENCOUNTER — Ambulatory Visit: Payer: Self-pay | Admitting: *Deleted

## 2011-05-25 DIAGNOSIS — Z111 Encounter for screening for respiratory tuberculosis: Secondary | ICD-10-CM

## 2011-06-02 ENCOUNTER — Other Ambulatory Visit: Payer: Self-pay | Admitting: Internal Medicine

## 2011-06-02 DIAGNOSIS — E119 Type 2 diabetes mellitus without complications: Secondary | ICD-10-CM

## 2011-06-03 MED ORDER — INSULIN ASPART 100 UNIT/ML ~~LOC~~ SOLN: 7.0000 [IU] | Freq: Three times a day (TID) | SUBCUTANEOUS | Status: DC

## 2011-07-08 ENCOUNTER — Encounter: Payer: Self-pay | Admitting: Internal Medicine

## 2011-07-08 ENCOUNTER — Ambulatory Visit (INDEPENDENT_AMBULATORY_CARE_PROVIDER_SITE_OTHER): Payer: Self-pay | Admitting: Internal Medicine

## 2011-07-08 VITALS — BP 143/94 | HR 79 | Temp 97.1°F | Wt 183.0 lb

## 2011-07-08 DIAGNOSIS — E119 Type 2 diabetes mellitus without complications: Secondary | ICD-10-CM

## 2011-07-08 DIAGNOSIS — G5603 Carpal tunnel syndrome, bilateral upper limbs: Secondary | ICD-10-CM

## 2011-07-08 DIAGNOSIS — I10 Essential (primary) hypertension: Secondary | ICD-10-CM

## 2011-07-08 DIAGNOSIS — Z0000 Encounter for general adult medical examination without abnormal findings: Secondary | ICD-10-CM

## 2011-07-08 DIAGNOSIS — G5600 Carpal tunnel syndrome, unspecified upper limb: Secondary | ICD-10-CM

## 2011-07-08 DIAGNOSIS — Z23 Encounter for immunization: Secondary | ICD-10-CM

## 2011-07-08 MED ORDER — TRAMADOL HCL 50 MG PO TABS: 25.0000 mg | ORAL_TABLET | Freq: Four times a day (QID) | ORAL | Status: DC | PRN

## 2011-07-08 MED ORDER — INSULIN GLARGINE 100 UNIT/ML ~~LOC~~ SOLN: 42.0000 [IU] | SUBCUTANEOUS | Status: DC

## 2011-07-08 NOTE — Assessment & Plan Note (Signed)
Lab Results  Component Value Date   HGBA1C 13.1 07/08/2011   HGBA1C 10.0 09/04/2010   CREATININE 0.90 02/16/2011   CREATININE 0.72 06/19/2010   MICROALBUR 20.58* 06/19/2010   MICRALBCREAT 299.1* 06/19/2010   CHOL 104 06/19/2010   HDL 38* 06/19/2010   TRIG 75 06/19/2010    Last eye exam and foot exam: Component Value Date/Time   HMDIABEYEEXA no diabetic retinopathy OU;OD visual acuity 20/20, intraocular pressure 21;OS intraocular pressure 20, visual acuity 20 05/01/2009   HMDIABFOOTEX done 08/21/2010    Assessment: Diabetes control: not controlled Progress toward goals: deteriorated Barriers to meeting goals: possible nonadherence to medications  Plan: Diabetes treatment: escalate lantus to 42 units. Refer to: none Instruction/counseling given: reminded to bring blood glucose meter & log to each visit, discussed the need for weight loss and discussed diet

## 2011-07-08 NOTE — Assessment & Plan Note (Addendum)
Pt has failed conservative management of her carpal tunnel syndrome with prior inadequate control of pain despite NSAIDs, localized steroid injections, splinting. She has not yet had followup for surgical evaluation due to prior refusal to go to [facility], and significant delay in getting [place] appointment due to lack of insurance. I spoke with the patient regarding the importance of having surgical evaluation or her carpal tunnel, because despite our medical management of her pain, we will be unable to resolve this pain as it likely require surgical intervention. The patient is now agreeable to following up with [facility] as this is the soonest appointment that she will be able to pursue. - Will arrange for [facility] followup with orthopedic surgery. - Continue wrist splints. - Will start low-dose tramadol, for pain control until surgery followup (The patient has already attempted regular NSAID usage without significant improvement her control of pain.). Patient was counseled on the side effects of tramadol and to avoid the use of alcohol, do not drive or operate heavy machinery if it causes drowsiness.

## 2011-07-08 NOTE — Progress Notes (Signed)
Subjective:    Patient ID: Nancy [name], female    DOB: 1956-07-14, 55 y.o.   MRN: [id number]  HPI Pt is a 55 y.o. female with a PMHX of carpal tunnel syndrome, HLD, DMII uncontrolled, who presents to the clinic today for the following:   1) Wrist pain - pt has history of BL carpal tunnel syndrome for past several years, which has been confirmed on nerve conduction studies. Has not yet had follow-up with an orthopedic surgeon for possible surgery, as wait for [facility] was > 3 months and in [place] was > 1 year because pt has no insurance. Pt refused [facility] previously because she does not know the health system. She is still working as a Quarry manager and the pain is affecting her ability to perform her job. She describes numbness of 2nd, 3rd, 4th digits, right > L but does happen bilaterally. Is having associated weakness of hands. Pain rated 9/10, taking ibuprofen OTC without relief. Aggravating factors include overuse but also painful at rest. No alleviating factors. No trauma, or prior fractures. Is using bilateral wrist splints daily.  2) DMII - A1c today is 13.1. Patient checking blood sugars 1-2 times daily, before breakfast and after dinner. Reports blood sugars of 200-300s mg/dL. Currently taking Lantus 37, Novolog 7 units TID. 0 hypoglycemic episodes since last visit. Confirms polyuria, polydipsia, nocturia. Denies nausea, vomiting, diarrhea.   3) Preventative care - due for tdap, PCV today.  4) HTN - Patient does not check blood pressure regularly at home. Currently taking Lisinopril 20 mg and hydrochlorothiazide 25 mg daily. Denies headaches, dizziness, lightheadedness, chest pain, shortness of breath.    Review of Systems Per HPI.  Current Outpatient Medications Medication Sig  . aspirin 81 MG EC tablet Take 81 mg by mouth daily.    Marland Kitchen atorvastatin (LIPITOR) 20 MG tablet Take 20 mg by mouth daily.    . Cholecalciferol (VITAMIN D3) 1000 UNITS CAPS Take 1 capsule by mouth daily.    Marland Kitchen  esomeprazole (NEXIUM) 40 MG capsule Take 40 mg by mouth 2 (two) times daily.    . fish oil-omega-3 fatty acids 1000 MG capsule Take 2 g by mouth daily.    Marland Kitchen glucose blood (TRUETRACK TEST) test strip Use to test blood sugar 4 times a day before meals and bedtime   . hydrochlorothiazide 25 MG tablet Take 25 mg by mouth daily.    . insulin aspart (NOVOLOG FLEXPEN) 100 UNIT/ML injection Inject 7 Units into the skin 3 (three) times daily before meals. 7 units; 10 minutes before each meal ( breakfast, lunch and supper) 3 times a day. If you skip a meal, skip the injection.  . insulin glargine (LANTUS) 100 UNIT/ML injection Inject 37 Units into the skin every morning. When you wake up  . Insulin Pen Needle (RELION MINI PEN NEEDLES) 31G X 6 MM MISC Use to take insulin injections 3 times a day before meals   . Lancets MISC Use to check blood sugar 4 times a day   . lisinopril (PRINIVIL,ZESTRIL) 20 MG tablet Take 1 tablet (20 mg total) by mouth daily.    Allergies Amitriptyline hcl  Past Medical History  Diagnosis Date  . Diabetic peripheral neuropathy   . Carpal tunnel syndrome, bilateral     confirmed on nerve conduction studies  . Hyperlipidemia   . Diabetes mellitus type 2, uncontrolled   . GERD (gastroesophageal reflux disease)   . Heat edema   . History of Helicobacter infection 11/07  . History of  chest pain     negative cardiolite September 2007.  ETT-myoview 12/10 with EF 76%, no ischemia,   . History of endometriosis     Past Surgical History  Procedure Date  . Abdominal hysterectomy        Objective:   Physical Exam General: Vital signs reviewed and noted. Well-developed, well-nourished, in no acute distress; alert, appropriate and cooperative throughout examination.  Head: Normocephalic, atraumatic.  Lungs:  Normal respiratory effort. Clear to auscultation BL without crackles or wheezes.  Heart: RRR. S1 and S2 normal without gallop, murmur, or rubs.  Abdomen:  BS  normoactive. Soft, Nondistended, non-tender.  No masses or organomegaly.  Extremities: No pretibial edema. Tinel sign positive on bilateral wrists. Tinel sign equivocal over cubital tunnel, with only mild discomfort over forearm. No thenar or hyperthenar atrophy appreciated. Sensation intact to light touch bilateral hands. Spurling test negative.       Assessment & Plan:  Case and plan of care discussed with Dr. Oval Linsey.

## 2011-07-08 NOTE — Assessment & Plan Note (Signed)
-   PCV today - Tdap today

## 2011-07-08 NOTE — Patient Instructions (Signed)
   Please follow-up at the clinic with me in 2-3 weeks, at which time we will reevaluate your diabetes and your hand pain.  Please follow-up at [facility] for your carpal tunnel syndrome.  If you have been started on new medication(s), and you develop throat closing, tongue swelling, rash, please stop the medication and call the clinic at [phone] and go to the ER.  Increase your lantus to 42 units.  Check blood sugars 3-4 times daily before breakfast, after lunch, after dinner, and at bedtime.   If you are diabetic, please bring your meter to your next visit.  If symptoms worsen, or new symptoms arise, please call the clinic or go to the ER.  Please bring all of your medications in a bag to your next visit.

## 2011-07-08 NOTE — Assessment & Plan Note (Signed)
Recheck her blood pressure was 124/82.   Lab Results  Component Value Date   NA 138 02/16/2011   K 3.9 02/16/2011   CL 101 02/16/2011   CO2 25 02/16/2011   BUN 14 02/16/2011   CREATININE 0.90 02/16/2011   CREATININE 0.72 06/19/2010    BP Readings from Last 3 Encounters:  07/08/11 143/94  04/15/11 148/96  04/03/11 134/92    Assessment: Hypertension control:  controlled  Progress toward goals:  at goal Barriers to meeting goals:  no barriers identified  Plan: Hypertension treatment:  continue current medications

## 2011-07-22 ENCOUNTER — Encounter: Payer: Self-pay | Admitting: Internal Medicine

## 2011-07-22 ENCOUNTER — Ambulatory Visit (INDEPENDENT_AMBULATORY_CARE_PROVIDER_SITE_OTHER): Payer: Self-pay | Admitting: Internal Medicine

## 2011-07-22 ENCOUNTER — Ambulatory Visit: Payer: Self-pay | Admitting: Dietician

## 2011-07-22 VITALS — BP 142/94 | HR 83 | Temp 97.2°F | Ht <= 58 in | Wt 182.7 lb

## 2011-07-22 DIAGNOSIS — R198 Other specified symptoms and signs involving the digestive system and abdomen: Secondary | ICD-10-CM

## 2011-07-22 DIAGNOSIS — I10 Essential (primary) hypertension: Secondary | ICD-10-CM

## 2011-07-22 LAB — LIPID PANEL
LDL Cholesterol: 53 mg/dL (ref 0–99)
Total CHOL/HDL Ratio: 3.1 Ratio
VLDL: 28 mg/dL (ref 0–40)

## 2011-07-22 LAB — TSH: TSH: 1.23 u[IU]/mL (ref 0.350–4.500)

## 2011-07-22 LAB — CBC
HCT: 40.3 % (ref 36.0–46.0)
RBC: 4.98 MIL/uL (ref 3.87–5.11)
RDW: 14 % (ref 11.5–15.5)
WBC: 8.1 10*3/uL (ref 4.0–10.5)

## 2011-07-22 NOTE — Progress Notes (Signed)
  Subjective:    Patient ID: Nancy [name], female    DOB: July 17, 1956, 55 y.o.   MRN: [id number]  HPI Patient is a 55 year old female with past medical history outlined below who presents to clinic requiring referral to GI specialist. She has seen Dr. Ardis Hughs in the past for problems with bowel movements and she reports having colonoscopy done but is not sure how long ago and is not sure what the results are. Her GI specialist started her on over-the-counter stool softeners and she's been taking them for several months with no relief. She reports watching her diet and trying to increase fiber with no significant success so far. She denies recent sicknesses or hospitalizations, no urinary concerns, no systemic symptoms of fevers and chills, weight loss or weight gain, no night sweats. She denies abdominal pain or blood in stool, no diarrhea or constipation but reports mucousy stool when bowel movement occurs.   Review of Systems Per HPI     Objective:   Physical Exam   Constitutional: Vital signs reviewed.  Patient is a well-developed and well-nourished in no acute distress and cooperative with exam. Alert and oriented x3.  Cardiovascular: RRR, S1 normal, S2 normal, no MRG, pulses symmetric and intact bilaterally Pulmonary/Chest: CTAB, no wheezes, rales, or rhonchi Abdominal: Soft. Non-tender, non-distended, bowel sounds are normal, no masses, organomegaly, or guarding present.   Psychiatric: Normal mood and affect. speech and behavior is normal. Judgment and thought content normal. Cognition and memory are normal.        Assessment & Plan:

## 2011-07-22 NOTE — Assessment & Plan Note (Signed)
Patient's concern of recurrent mucousy stool for approximately one year raises concern of malabsorption syndrome. I am not exactly sure what the etiology is however patient has seen Dr. Ardis Hughs of a bar or GI and a differential time I think that the best plan is to send patient back to Dr. Ardis Hughs for further evaluation. I have discussed with patient fiber intake also minimum 20-25 g per day along with plenty of fluids to maintain hydration. Patient does not have worrisome symptoms for malignancy including no weight loss, no systemic symptoms, no blood in urine or stool. Today we will check CBC and TSH to ensure if within normal limits. We will also followup on Dr. Ardis Hughs recommendations. Patient has appointment scheduled on August 24 at 1:45 PM

## 2011-07-24 ENCOUNTER — Other Ambulatory Visit: Payer: Self-pay | Admitting: *Deleted

## 2011-07-24 DIAGNOSIS — E119 Type 2 diabetes mellitus without complications: Secondary | ICD-10-CM

## 2011-07-24 MED ORDER — INSULIN GLARGINE 100 UNIT/ML ~~LOC~~ SOLN: 42.0000 [IU] | SUBCUTANEOUS | Status: DC

## 2011-07-27 NOTE — Telephone Encounter (Signed)
Called to pharm 

## 2011-07-31 ENCOUNTER — Encounter: Payer: Self-pay | Admitting: Gastroenterology

## 2011-07-31 ENCOUNTER — Ambulatory Visit: Payer: Self-pay | Admitting: Dietician

## 2011-07-31 ENCOUNTER — Ambulatory Visit (INDEPENDENT_AMBULATORY_CARE_PROVIDER_SITE_OTHER): Payer: Self-pay | Admitting: Gastroenterology

## 2011-07-31 DIAGNOSIS — R195 Other fecal abnormalities: Secondary | ICD-10-CM

## 2011-07-31 DIAGNOSIS — D126 Benign neoplasm of colon, unspecified: Secondary | ICD-10-CM | POA: Insufficient documentation

## 2011-07-31 DIAGNOSIS — Z86010 Personal history of colonic polyps: Secondary | ICD-10-CM

## 2011-07-31 NOTE — Progress Notes (Signed)
Review of pertinent gastrointestinal problems: 1. Adenomatous polyp, 12/2008 colonoscopy done for fob+ stool, recall set for 5 year interval 2. Mucous discharge, abd pain "cloth like" substance coming out of her bottom.  She submitted a specimen, pathology said it was a "strand of mucus".  HPI: This is a  very pleasant 55 year old woman who is here with her mother and again today. I last saw her about 3 months ago.  She has to reach in to bottom to pull out stool with mucous associated with it.  Feels like "cloth."  Fiber supplements for sweveral weeks made no difference.  BMs cause abdominal pains.  Overall gaining- 6 months up 30 pounds.    Review of systems: Pertinent positive and negative review of systems were noted in the above HPI section.  All other review of systems was otherwise negative.   Past Medical History  Diagnosis Date  . Diabetic peripheral neuropathy   . Carpal tunnel syndrome, bilateral     confirmed on nerve conduction studies  . Hyperlipidemia   . Diabetes mellitus type 2, uncontrolled   . GERD (gastroesophageal reflux disease)   . Heat edema   . History of Helicobacter infection 11/07  . History of chest pain     negative cardiolite September 2007.  ETT-myoview 12/10 with EF 76%, no ischemia,   . History of endometriosis     Past Surgical History  Procedure Date  . Abdominal hysterectomy      reports that she has never smoked. She has never used smokeless tobacco. She reports that she does not drink alcohol or use illicit drugs.  family history includes Breast cancer in her maternal grandmother; Diabetes in her mother; Liver cancer in her maternal grandfather; and Lung cancer in her maternal uncle.  There is no history of Colon cancer.    Current Medications, Allergies were all reviewed with the patient via [facility] record system.    Physical Exam: BP 132/80  Pulse 68  Ht 4\' 9"  (1.448 m)  Wt 183 lb (83.008 kg)  BMI  39.60 kg/m2  LMP 02/15/2009 Constitutional: generally well-appearing Cardiovascular: heart regular rate and rhythm Lungs: clear to auscultation bilaterally Abdomen: soft, nontender, nondistended, no obvious ascites, no peritoneal signs, normal bowel sounds Extremities: no lower extremity edema bilaterally Skin: no lesions on visible extremities A. no rectal examination with female assistance in room: Small external anal hemorrhoid, no masses in rectum, stool is brown, no mucus.   Assessment and plan: 55 y.o. female with mucous in her stools  She is very bothered by this symptom, she has to pull her stools out manually with strands of cloth-like mucus. This is an unusual symptom. Not sure exactly what she is seeing but I think a flexible sigmoidoscopy is a reasonable next step least to reassure her that nothing serious is going on. We will schedule that at her since convenience.

## 2011-07-31 NOTE — Patient Instructions (Signed)
You will be set up for a flexible sigmoidoscopy.

## 2011-08-11 ENCOUNTER — Telehealth: Payer: Self-pay | Admitting: Gastroenterology

## 2011-08-11 NOTE — Telephone Encounter (Signed)
Went over pre op instructions for today and am for her Flex Sig tomorrow at 1:30pm; pt stated understanding.

## 2011-08-12 ENCOUNTER — Encounter: Payer: Self-pay | Admitting: Gastroenterology

## 2011-08-12 ENCOUNTER — Ambulatory Visit (AMBULATORY_SURGERY_CENTER): Payer: Self-pay | Admitting: Gastroenterology

## 2011-08-12 DIAGNOSIS — R195 Other fecal abnormalities: Secondary | ICD-10-CM

## 2011-08-12 DIAGNOSIS — Z86010 Personal history of colonic polyps: Secondary | ICD-10-CM

## 2011-08-12 LAB — GLUCOSE, CAPILLARY
Glucose-Capillary: 146 mg/dL — ABNORMAL HIGH (ref 70–99)
Glucose-Capillary: 74 mg/dL (ref 70–99)
Glucose-Capillary: 117 mg/dL — ABNORMAL HIGH (ref 70–99)

## 2011-08-12 MED ORDER — SODIUM CHLORIDE 0.9 % IV SOLN: 500.0000 mL | INTRAVENOUS | Status: DC

## 2011-08-12 NOTE — Patient Instructions (Signed)
See the picture page for your findings from your exam today.  Follow the green and blue discharge instruction sheets the rest of the day.  Resume your prior medications today. Please call if any questions or concerns.

## 2011-08-12 NOTE — Progress Notes (Signed)
Pt voiced no complaints on discharge. maw

## 2011-08-13 ENCOUNTER — Telehealth: Payer: Self-pay

## 2011-08-13 NOTE — Telephone Encounter (Signed)

## 2011-08-31 ENCOUNTER — Encounter: Payer: Self-pay | Admitting: Internal Medicine

## 2011-09-01 ENCOUNTER — Other Ambulatory Visit: Payer: Self-pay | Admitting: *Deleted

## 2011-09-01 DIAGNOSIS — K219 Gastro-esophageal reflux disease without esophagitis: Secondary | ICD-10-CM

## 2011-09-02 MED ORDER — ESOMEPRAZOLE MAGNESIUM 40 MG PO CPDR: 40.0000 mg | DELAYED_RELEASE_CAPSULE | Freq: Two times a day (BID) | ORAL | Status: DC

## 2011-09-08 ENCOUNTER — Encounter: Payer: Self-pay | Admitting: Internal Medicine

## 2011-09-10 ENCOUNTER — Ambulatory Visit (INDEPENDENT_AMBULATORY_CARE_PROVIDER_SITE_OTHER): Payer: Self-pay | Admitting: Internal Medicine

## 2011-09-10 ENCOUNTER — Encounter: Payer: Self-pay | Admitting: Internal Medicine

## 2011-09-10 DIAGNOSIS — G5603 Carpal tunnel syndrome, bilateral upper limbs: Secondary | ICD-10-CM

## 2011-09-10 DIAGNOSIS — E119 Type 2 diabetes mellitus without complications: Secondary | ICD-10-CM

## 2011-09-10 DIAGNOSIS — G5600 Carpal tunnel syndrome, unspecified upper limb: Secondary | ICD-10-CM

## 2011-09-10 DIAGNOSIS — Z86010 Personal history of colonic polyps: Secondary | ICD-10-CM

## 2011-09-10 DIAGNOSIS — E1149 Type 2 diabetes mellitus with other diabetic neurological complication: Secondary | ICD-10-CM

## 2011-09-10 DIAGNOSIS — I10 Essential (primary) hypertension: Secondary | ICD-10-CM

## 2011-09-10 DIAGNOSIS — G609 Hereditary and idiopathic neuropathy, unspecified: Secondary | ICD-10-CM

## 2011-09-10 LAB — GLUCOSE, CAPILLARY: Glucose-Capillary: 148 mg/dL — ABNORMAL HIGH (ref 70–99)

## 2011-09-10 MED ORDER — INSULIN ASPART 100 UNIT/ML ~~LOC~~ SOLN: 10.0000 [IU] | Freq: Three times a day (TID) | SUBCUTANEOUS | Status: DC

## 2011-09-10 MED ORDER — INSULIN GLARGINE 100 UNIT/ML ~~LOC~~ SOLN: 47.0000 [IU] | SUBCUTANEOUS | Status: DC

## 2011-09-10 MED ORDER — GABAPENTIN (ONCE-DAILY) 300 MG PO TABS: 300.0000 mg | ORAL_TABLET | Freq: Two times a day (BID) | ORAL | Status: DC

## 2011-09-10 MED ORDER — METFORMIN HCL 1000 MG PO TABS: 1000.0000 mg | ORAL_TABLET | Freq: Two times a day (BID) | ORAL | Status: DC

## 2011-09-10 NOTE — Assessment & Plan Note (Addendum)
Diabetes poorly controlled likely due to diet. Last Hgb A1c 13.1 in 07/2011. Will increase Lantus to 47 units in the morning. Her CBG in the morning are between 200-300. Furthermore I will increase Novolog to 10 units with each meal and advised to take her Metformin daily. I informed her that she can get at [facility] free Metformin. Furthermore I underlined the importance to control her diet. She said she will try. She further needs to check her sugars daily before breakfast and need to bring in her meter at the next office visit.

## 2011-09-10 NOTE — Progress Notes (Deleted)
  Subjective:    Patient ID: Nancy [name], female    DOB: 04-17-1956, 55 y.o.   MRN: [id number]  HPI    Review of Systems     Objective:   Physical Exam        Assessment & Plan:

## 2011-09-10 NOTE — Assessment & Plan Note (Signed)
Need surgery but since no insurance she is not able to afford it. Will start today on Gabapentin low dose and titrate up weekly if tolerating it. Patient may benefit from steroid injection at this point since she will not get surgery.

## 2011-09-10 NOTE — Progress Notes (Signed)
Subjective:   Patient ID: Nancy [name] female   DOB: 1956/05/10 55 y.o.   MRN: [id number]  HPI: [name] is a 55 y.o. female with PMH significant as outlined below who presented to the clinic for a regular office visit and hand pain.  1. Hand pain due to bilateral carpal tunnel syndrome. Sever. Need surgery since patient has no insurance she can not effort it. Furthermore she is working as a Marine scientist and she can not be out of work for too long otherwise she will loose her job. She tried tramadol which did not work. She tried Amitriptyline but that made her stomach upset. She is not sure if she has tried gabapentin. 2. DM: CBG range around 200-300. Did not bring her meter with her. Her diet consist of fried food, rice, fruits. She noted that is sooo difficult to control . She is trying hard but today she had to eat from [place]. She is very frustrated that she has DM and has to take shots. She has seen Barnabas Harries in the past. She knows she has to change her eating habits but she just can not. She reports that she has not been taking Metformin daily since she was not able to afford it.  3. Abdominal pain, chronic. It is coming and going. Has seen Dr Ardis Hughs in the past.    Past Medical History  Diagnosis Date  . Diabetic peripheral neuropathy   . Carpal tunnel syndrome, bilateral     confirmed on nerve conduction studies  . Hyperlipidemia   . Diabetes mellitus type 2, uncontrolled   . GERD (gastroesophageal reflux disease)   . Heat edema   . History of Helicobacter infection 11/07  . History of chest pain     negative cardiolite September 2007.  ETT-myoview 12/10 with EF 76%, no ischemia,   . History of endometriosis    Current Outpatient Prescriptions  Medication Sig Dispense Refill  . aspirin 81 MG EC tablet Take 81 mg by mouth daily.        Marland Kitchen atorvastatin (LIPITOR) 20 MG tablet Take 20 mg by mouth daily.        . Cholecalciferol (VITAMIN D3) 1000 UNITS CAPS Take 1 capsule  by mouth daily.        Marland Kitchen esomeprazole (NEXIUM) 40 MG capsule Take 1 capsule (40 mg total) by mouth 2 (two) times daily.  180 capsule  1  . fish oil-omega-3 fatty acids 1000 MG capsule Take 2 g by mouth daily.        Marland Kitchen glucose blood (TRUETRACK TEST) test strip Use to test blood sugar 4 times a day before meals and bedtime       . hydrochlorothiazide 25 MG tablet Take 25 mg by mouth daily.        . insulin aspart (NOVOLOG FLEXPEN) 100 UNIT/ML injection Inject 7 Units into the skin 3 (three) times daily before meals. 7 units; 10 minutes before each meal ( breakfast, lunch and supper) 3 times a day. If you skip a meal, skip the injection.  10 mL  3  . insulin glargine (LANTUS) 100 UNIT/ML injection Inject 42 Units into the skin every morning. When you wake up  30 mL  0  . Insulin Pen Needle (RELION MINI PEN NEEDLES) 31G X 6 MM MISC Use to take insulin injections 3 times a day before meals       . Lancets MISC Use to check blood sugar 4 times a day  Family History  Problem Relation Age of Onset  . Diabetes Mother   . Colon cancer Neg Hx   . Lung cancer Maternal Uncle   . Breast cancer Maternal Grandmother   . Liver cancer Maternal Grandfather    History   Social History  . Marital Status: Single    Spouse Name: N/A    Number of Children: 0  . Years of Education: N/A   Occupational History  . CNA    Social History Main Topics  . Smoking status: Never Smoker   . Smokeless tobacco: Never Used  . Alcohol Use: No  . Drug Use: No  . Sexually Active: None   Other Topics Concern  . None   Social History Narrative   Financial assistance approved for 100% discount at [facility] and has [facility] card per Chilon Boone10/21/2011   Review of Systems: Constitutional: Denies fever, chills, diaphoresis, appetite change and fatigue.  HEENT: Denies photophobia, eye pain, redness, hearing loss, ear pain, congestion, sore throat, rhinorrhea, sneezing, mouth sores, trouble swallowing, neck pain, neck  stiffness and tinnitus.   Respiratory: Denies SOB, DOE, cough, chest tightness,  and wheezing.   Cardiovascular: Denies chest pain, palpitations and leg swelling.  Gastrointestinal: Denies nausea, vomiting, abdominal pain, diarrhea, constipation, blood in stool and abdominal distention.  Genitourinary: Denies dysuria, urgency, frequency, hematuria, flank pain and difficulty urinating.  Musculoskeletal:noted significant pain in her hands. Neurological: Denies dizziness, seizures, syncope, weakness, light-headedness, numbness and headaches.   Objective:  Physical Exam: Filed Vitals:   09/10/11 1351  BP: 114/81  Pulse: 101  Temp: 97 F (36.1 C)  TempSrc: Oral  Height: 4\' 9"  (1.448 m)  Weight: 180 lb (81.647 kg)   Constitutional: Vital signs reviewed.  Patient is a well-developed and well-nourished  in no acute distress and cooperative with exam. Alert and oriented x3.  Eyes: PERRL, EOMI, conjunctivae normal, No scleral icterus.  Neck: Supple  Cardiovascular: RRR, S1 normal, S2 normal, no MRG, pulses symmetric and intact bilaterally Pulmonary/Chest: CTAB, no wheezes, rales, or rhonchi Abdominal: Soft. Non-tender, non-distended, bowel sounds are normal, no masses, organomegaly, or guarding present.  GU: no CVA tenderness Musculoskeletal: No joint deformities, erythema, decreased ROM and tenderness in hands bilateral Neurological: A&O x3,no focal motor deficit, sensory intact to light touch bilaterally.  Skin: Warm, dry and intact. No rash, cyanosis, or clubbing.

## 2011-09-10 NOTE — Assessment & Plan Note (Signed)
Blood pressure well controleld. Will continue current regimen. BP Readings from Last 3 Encounters:  09/10/11 114/81  08/12/11 138/87  07/31/11 132/80

## 2011-09-10 NOTE — Assessment & Plan Note (Signed)
Will start Gabapentin and consider to titrate up if tolerating.

## 2011-09-10 NOTE — Patient Instructions (Signed)
1. We increase Lantus to 47 units in the morning 2. We increased Novolog to 10 units with each meal. 3. Continue to take Metformin 1000 mg twice a day 4. Check your sugars every day before breakfast and bring you meter at each visit. 5. Bring all your medication with you at each visit.

## 2011-09-24 ENCOUNTER — Encounter: Payer: Self-pay | Admitting: Internal Medicine

## 2011-09-24 ENCOUNTER — Ambulatory Visit (INDEPENDENT_AMBULATORY_CARE_PROVIDER_SITE_OTHER): Payer: Self-pay | Admitting: Internal Medicine

## 2011-09-24 VITALS — BP 123/88 | HR 94 | Temp 98.0°F | Ht <= 58 in | Wt 179.4 lb

## 2011-09-24 DIAGNOSIS — L0293 Carbuncle, unspecified: Secondary | ICD-10-CM

## 2011-09-24 DIAGNOSIS — E119 Type 2 diabetes mellitus without complications: Secondary | ICD-10-CM

## 2011-09-24 DIAGNOSIS — I10 Essential (primary) hypertension: Secondary | ICD-10-CM

## 2011-09-24 DIAGNOSIS — L0292 Furuncle, unspecified: Secondary | ICD-10-CM

## 2011-09-24 NOTE — Patient Instructions (Signed)
Please check your blood glucose level every morning before breakfast and bring in your meter with you at the next office visit.

## 2011-09-24 NOTE — Progress Notes (Signed)
Subjective:   Patient ID: [name] female   DOB: 1956-04-12 55 y.o.   MRN: [id number]  HPI: [name] is a 55 y.o.  Female with PMH significant as outlined below who presented to the clinic for diabetes follow up. Patient has increased her Lantus to 47 units and is using Novolog 10 units with each meal but has not changes her diet habit and has not been checking her blood glucose on a daily basis. Her CBG today is 331. She noted that she just had a hot dog. He main concern today is the lesion on her thigh . It comes and goes. It is swollen most of the time and very pain full. Denies any redness, drainage, fevers or chills.    Past Medical History  Diagnosis Date  . Diabetic peripheral neuropathy   . Carpal tunnel syndrome, bilateral     confirmed on nerve conduction studies  . Hyperlipidemia   . Diabetes mellitus type 2, uncontrolled   . GERD (gastroesophageal reflux disease)   . Heat edema   . History of Helicobacter infection 11/07  . History of chest pain     negative cardiolite September 2007.  ETT-myoview 12/10 with EF 76%, no ischemia,   . History of endometriosis    Current Outpatient Prescriptions  Medication Sig Dispense Refill  . aspirin 81 MG EC tablet Take 81 mg by mouth daily.        Marland Kitchen atorvastatin (LIPITOR) 20 MG tablet Take 20 mg by mouth daily.        . Cholecalciferol (VITAMIN D3) 1000 UNITS CAPS Take 1 capsule by mouth daily.        Marland Kitchen esomeprazole (NEXIUM) 40 MG capsule Take 1 capsule (40 mg total) by mouth 2 (two) times daily.  180 capsule  1  . fish oil-omega-3 fatty acids 1000 MG capsule Take 2 g by mouth daily.        . Gabapentin, PHN, 300 MG TABS Take 300 mg by mouth 2 (two) times daily.  60 tablet  3  . glucose blood (TRUETRACK TEST) test strip Use to test blood sugar 4 times a day before meals and bedtime       . hydrochlorothiazide 25 MG tablet Take 25 mg by mouth daily.        . insulin aspart (NOVOLOG FLEXPEN) 100 UNIT/ML injection Inject  10 Units into the skin 3 (three) times daily before meals. 7 units; 10 minutes before each meal ( breakfast, lunch and supper) 3 times a day. If you skip a meal, skip the injection.  10 mL  3  . insulin glargine (LANTUS) 100 UNIT/ML injection Inject 47 Units into the skin every morning. When you wake up  30 mL  0  . Insulin Pen Needle (RELION MINI PEN NEEDLES) 31G X 6 MM MISC Use to take insulin injections 3 times a day before meals       . Lancets MISC Use to check blood sugar 4 times a day       . metFORMIN (GLUCOPHAGE) 1000 MG tablet Take 1 tablet (1,000 mg total) by mouth 2 (two) times daily with a meal.  60 tablet  3   Family History  Problem Relation Age of Onset  . Diabetes Mother   . Colon cancer Neg Hx   . Lung cancer Maternal Uncle   . Breast cancer Maternal Grandmother   . Liver cancer Maternal Grandfather   Review of Systems: Constitutional: Denies fever, chills, diaphoresis, appetite change and  fatigue.  Respiratory: Denies SOB, DOE, cough, chest tightness,  and wheezing.   Cardiovascular: Denies chest pain, palpitations and leg swelling.  Gastrointestinal: Denies nausea, vomiting, abdominal pain, diarrhea, constipation, blood in stool and abdominal distention.  Genitourinary: Denies dysuria, urgency, frequency, hematuria, flank pain and difficulty urinating.  Neurological: Denies dizziness, seizures, syncope, weakness, light-headedness, numbness and headaches.  Hematological: Denies adenopathy. Easy bruising, personal or family bleeding history    Objective:  Physical Exam: Filed Vitals:   09/24/11 1515  BP: 123/88  Pulse: 94  Temp: 98 F (36.7 C)  TempSrc: Oral  Height: 4\' 9"  (1.448 m)  Weight: 179 lb 6.4 oz (81.375 kg)   Constitutional: Vital signs reviewed.  Patient is a well-developed and well-nourished ,  in no acute distress and cooperative with exam. Alert and oriented x3.  Neck: Supple, Cardiovascular: RRR, S1 normal, S2 normal, no MRG, pulses symmetric and  intact bilaterally Pulmonary/Chest: CTAB, no wheezes, rales, or rhonchi Abdominal: Soft. Non-tender, non-distended, bowel sounds are normal, no masses, organomegaly, or guarding present.  GU: no CVA tenderness Musculoskeletal: No joint deformities, erythema, or stiffness, ROM full and no nontender Neurological: A&O x3, Strenght is normal and symmetric bilaterally, cranial nerve II-XII are grossly intact, no focal motor deficit, sensory intact to light touch bilaterally.  Skin:  left inner thigh: boil present, with no drainage and no erythema.

## 2011-09-25 DIAGNOSIS — L0292 Furuncle, unspecified: Secondary | ICD-10-CM | POA: Insufficient documentation

## 2011-09-25 LAB — MICROALBUMIN / CREATININE URINE RATIO
Creatinine, Urine: 96.9 mg/dL
Microalb Creat Ratio: 230.4 mg/g — ABNORMAL HIGH (ref 0.0–30.0)

## 2011-09-25 NOTE — Assessment & Plan Note (Addendum)
Blood pressure well controlled. Will check urine micro/albumin ration today. Patient may benefit from ACEi.   BP Readings from Last 3 Encounters:  09/24/11 123/88  09/10/11 114/81  08/12/11 138/87

## 2011-09-25 NOTE — Assessment & Plan Note (Addendum)
Uncontrolled. Patient's CBG continues to be elevated. Continues to be non-compliant with diet recommendation. I had a long discussion about the importance about changes in eating habits and exercise. She has seen Nancy [name] in the past . She noted that she will try to improve from next week on since [birth year] is her birthday. She was again reminded to check her CBG daily before breakfast and bring in the meter at the next office visit.  Will check urine micro/albumin ration today. During the office visit will do foot exam.

## 2011-09-25 NOTE — Assessment & Plan Note (Signed)
Recommended conservative therapy. Advised if patient notices worsening swelling, erythema , significant  Drainage with fever and chills patient needs to be evaluated in the clinic for further management.

## 2011-09-28 MED ORDER — LISINOPRIL 20 MG PO TABS: 20.0000 mg | ORAL_TABLET | Freq: Every day | ORAL | Status: DC

## 2011-09-28 MED ORDER — LISINOPRIL-HYDROCHLOROTHIAZIDE 10-12.5 MG PO TABS: 1.0000 | ORAL_TABLET | Freq: Every day | ORAL | Status: DC

## 2011-09-28 NOTE — Progress Notes (Signed)
Addended by: Rosalia Hammers on: 09/28/2011 05:24 PM   Modules accepted: Orders

## 2011-10-01 ENCOUNTER — Encounter: Payer: Self-pay | Admitting: Internal Medicine

## 2011-10-26 ENCOUNTER — Other Ambulatory Visit: Payer: Self-pay | Admitting: *Deleted

## 2011-10-26 ENCOUNTER — Encounter: Payer: Self-pay | Admitting: Internal Medicine

## 2011-10-26 DIAGNOSIS — E785 Hyperlipidemia, unspecified: Secondary | ICD-10-CM

## 2011-10-27 MED ORDER — ATORVASTATIN CALCIUM 20 MG PO TABS: 20.0000 mg | ORAL_TABLET | Freq: Every day | ORAL | Status: DC

## 2011-10-28 NOTE — Telephone Encounter (Signed)
Lipitor rx refill - request faxed to [facility].

## 2011-11-23 ENCOUNTER — Encounter: Payer: Self-pay | Admitting: Internal Medicine

## 2011-11-23 ENCOUNTER — Other Ambulatory Visit: Payer: Self-pay | Admitting: *Deleted

## 2011-11-23 DIAGNOSIS — I10 Essential (primary) hypertension: Secondary | ICD-10-CM

## 2011-11-23 DIAGNOSIS — E1149 Type 2 diabetes mellitus with other diabetic neurological complication: Secondary | ICD-10-CM

## 2011-11-23 DIAGNOSIS — E119 Type 2 diabetes mellitus without complications: Secondary | ICD-10-CM

## 2011-11-23 DIAGNOSIS — E785 Hyperlipidemia, unspecified: Secondary | ICD-10-CM

## 2011-11-23 MED ORDER — LISINOPRIL 20 MG PO TABS: 20.0000 mg | ORAL_TABLET | Freq: Every day | ORAL | Status: DC

## 2011-11-23 MED ORDER — GABAPENTIN (ONCE-DAILY) 300 MG PO TABS: 300.0000 mg | ORAL_TABLET | Freq: Two times a day (BID) | ORAL | Status: DC

## 2011-11-23 MED ORDER — INSULIN GLARGINE 100 UNIT/ML ~~LOC~~ SOLN: 47.0000 [IU] | SUBCUTANEOUS | Status: DC

## 2011-11-23 MED ORDER — VITAMIN D3 25 MCG (1000 UT) PO CAPS: 1.0000 | ORAL_CAPSULE | Freq: Every day | ORAL | Status: DC

## 2011-11-23 MED ORDER — HYDROCHLOROTHIAZIDE 25 MG PO TABS: 25.0000 mg | ORAL_TABLET | Freq: Every day | ORAL | Status: DC

## 2011-11-23 MED ORDER — ATORVASTATIN CALCIUM 20 MG PO TABS: 20.0000 mg | ORAL_TABLET | Freq: Every day | ORAL | Status: DC

## 2011-12-10 ENCOUNTER — Encounter: Payer: Self-pay | Admitting: Internal Medicine

## 2011-12-10 ENCOUNTER — Ambulatory Visit (INDEPENDENT_AMBULATORY_CARE_PROVIDER_SITE_OTHER): Payer: Self-pay | Admitting: Internal Medicine

## 2011-12-10 VITALS — BP 126/88 | HR 77 | Temp 97.1°F | Ht <= 58 in | Wt 173.6 lb

## 2011-12-10 DIAGNOSIS — M25519 Pain in unspecified shoulder: Secondary | ICD-10-CM

## 2011-12-10 DIAGNOSIS — E119 Type 2 diabetes mellitus without complications: Secondary | ICD-10-CM

## 2011-12-10 NOTE — Assessment & Plan Note (Addendum)
Patient reports about left-sided neck and shoulder pain with radiating to the arm. Patient has chronic history of left-sided shoulder pain. I think at this point this is  Musculoskeletal. I recommend to use nonsteroidal, warm compresses. 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 I advised her if patient noticed any significant weakness she needs to be reevaluated in the clinic for further management.

## 2011-12-10 NOTE — Patient Instructions (Addendum)
1. You can use Ibuprofen 400 mg TID for one week. 2. You can use some warm compresses on your shoulders twice a day 3.  Back Exercises Back exercises help treat and prevent back injuries. The goal is to increase your strength in your belly (abdominal) and back muscles. These exercises can also help with flexibility. Start these exercises when told by your doctor. HOME CARE Back exercises include: Pelvic Tilt.  Lie on your back with your knees bent. Tilt your pelvis until the lower part of your back is against the floor. Hold this position 5 to 10 sec. Repeat this exercise 5 to 10 times.  Knee to Chest.  Pull 1 knee up against your chest and hold for 20 to 30 seconds. Repeat this with the other knee. This may be done with the other leg straight or bent, whichever feels better. Then, pull both knees up against your chest.  Sit-Ups or Curl-Ups.  Bend your knees 90 degrees. Start with tilting your pelvis, and do a partial, slow sit-up. Only lift your upper half 30 to 45 degrees off the floor. Take at least 2 to 3 seonds for each sit-up. Do not do sit-ups with your knees out straight. If partial sit-ups are difficult, simply do the above but with only tightening your belly (abdominal) muscles and holding it as told.  Hip-Lift.  Lie on your back with your knees flexed 90 degrees. Push down with your feet and shoulders as you raise your hips 2 inches off the floor. Hold for 10 seconds, repeat 5 to 10 times.  Back Arches.  Lie on your stomach. Prop yourself up on bent elbows. Slowly press on your hands, causing an arch in your low back. Repeat 3 to 5 times.  Shoulder-Lifts.  Lie face down with arms beside your body. Keep hips and belly pressed to floor as you slowly lift your head and shoulders off the floor.  Do not overdo your exercises. Be careful in the beginning. Exercises may cause you some mild back discomfort. If the pain lasts for more than 15 minutes, stop the exercises until you see your  doctor. Improvement with exercise for back problems is slow.   Document Released: 12/26/2010 Document Revised: 08/05/2011 Document Reviewed: 12/26/2010 [facility] Patient Information 2012 [place].Back Exercises Back exercises help treat and prevent back injuries. The goal is to increase your strength in your belly (abdominal) and back muscles. These exercises can also help with flexibility. Start these exercises when told by your doctor. HOME CARE Back exercises include: Pelvic Tilt.  Lie on your back with your knees bent. Tilt your pelvis until the lower part of your back is against the floor. Hold this position 5 to 10 sec. Repeat this exercise 5 to 10 times.  Knee to Chest.  Pull 1 knee up against your chest and hold for 20 to 30 seconds. Repeat this with the other knee. This may be done with the other leg straight or bent, whichever feels better. Then, pull both knees up against your chest.  Sit-Ups or Curl-Ups.  Bend your knees 90 degrees. Start with tilting your pelvis, and do a partial, slow sit-up. Only lift your upper half 30 to 45 degrees off the floor. Take at least 2 to 3 seonds for each sit-up. Do not do sit-ups with your knees out straight. If partial sit-ups are difficult, simply do the above but with only tightening your belly (abdominal) muscles and holding it as told.  Hip-Lift.  Lie on your  back with your knees flexed 90 degrees. Push down with your feet and shoulders as you raise your hips 2 inches off the floor. Hold for 10 seconds, repeat 5 to 10 times.  Back Arches.  Lie on your stomach. Prop yourself up on bent elbows. Slowly press on your hands, causing an arch in your low back. Repeat 3 to 5 times.  Shoulder-Lifts.  Lie face down with arms beside your body. Keep hips and belly pressed to floor as you slowly lift your head and shoulders off the floor.  Do not overdo your exercises. Be careful in the beginning. Exercises may cause you some mild back discomfort.  If the pain lasts for more than 15 minutes, stop the exercises until you see your doctor. Improvement with exercise for back problems is slow.   Document Released: 12/26/2010 Document Revised: 08/05/2011 Document Reviewed: 12/26/2010 [facility] Patient Information 2012 [place].

## 2011-12-10 NOTE — Assessment & Plan Note (Signed)
Diabetes well controlled on current regimen hemoglobin A1c today 7.9. The patient also has lost 6 pounds since last office visit. She reports that she has changed her diet and has been drinking more water. Will continue current regimen at this point and reevaluate the patient in 3 months for possible changes in management.

## 2011-12-10 NOTE — Progress Notes (Signed)
Subjective:   Patient ID: Nancy [name] female   DOB: 01-30-1956 56 y.o.   MRN: [id number]  HPI: Nancy [name] is a 56 y.o. female with past medical history significant as outlined below who presented to the clinic for a radical office visit for her diabetes but she was also complaining about neck pain which has been present since 3 weeks. Patient noted that she had been experiencing  Pin and needle like pain occasionally, itching and some radiating pain to the left arm. She has history of neuropathy and carpal tunnel but this is some what different. She further reports that she noticed a lump on her back of her neck which was big but has decreased in size. 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    Past Medical History  Diagnosis Date  . Diabetic peripheral neuropathy   . Carpal tunnel syndrome, bilateral     confirmed on nerve conduction studies  . Hyperlipidemia   . Diabetes mellitus type 2, uncontrolled   . GERD (gastroesophageal reflux disease)   . Heat edema   . History of Helicobacter infection 11/07  . History of chest pain     negative cardiolite September 2007.  ETT-myoview 12/10 with EF 76%, no ischemia,   . History of endometriosis    Current Outpatient Prescriptions  Medication Sig Dispense Refill  . aspirin 81 MG EC tablet Take 81 mg by mouth daily.        Marland Kitchen atorvastatin (LIPITOR) 20 MG tablet Take 1 tablet (20 mg total) by mouth daily.  90 tablet  1  . Cholecalciferol (VITAMIN D3) 1000 UNITS CAPS Take 1 capsule (1,000 Units total) by mouth daily.  30 capsule  2  . esomeprazole (NEXIUM) 40 MG capsule Take 1 capsule (40 mg total) by mouth 2 (two) times daily.  180 capsule  1  . fish oil-omega-3 fatty acids 1000 MG capsule Take 2 g by mouth daily.        . Gabapentin, PHN, 300 MG TABS Take 300 mg by mouth 2 (two) times daily.  60 tablet  3  . glucose blood (TRUETRACK TEST)  test strip Use to test blood sugar 4 times a day before meals and bedtime       . hydrochlorothiazide (HYDRODIURIL) 25 MG tablet Take 1 tablet (25 mg total) by mouth daily.  30 tablet  2  . insulin aspart (NOVOLOG FLEXPEN) 100 UNIT/ML injection Inject 10 Units into the skin 3 (three) times daily before meals. 7 units; 10 minutes before each meal ( breakfast, lunch and supper) 3 times a day. If you skip a meal, skip the injection.  10 mL  3  . insulin glargine (LANTUS) 100 UNIT/ML injection Inject 47 Units into the skin every morning. When you wake up  30 mL  1  . Insulin Pen Needle (RELION MINI PEN NEEDLES) 31G X 6 MM MISC Use to take insulin injections 3 times a day before meals       . Lancets MISC Use to check blood sugar 4 times a day       . lisinopril (PRINIVIL,ZESTRIL) 20 MG tablet Take 1 tablet (20 mg total) by mouth daily.  30 tablet  3  . metFORMIN (GLUCOPHAGE) 1000 MG tablet Take 1 tablet (1,000 mg total) by mouth 2 (two) times daily with a meal.  60 tablet  3   Review of Systems: Constitutional: Denies  fever, chills, diaphoresis, appetite change and fatigue.  Respiratory: Denies SOB, DOE, cough, chest tightness,  and wheezing.   Cardiovascular: Denies chest pain, palpitations and leg swelling.  Gastrointestinal: Denies nausea, vomiting, abdominal pain, diarrhea, constipation, blood in stool and abdominal distention.  Genitourinary: Denies dysuria, urgency, frequency, hematuria, flank pain and difficulty urinating.  Skin: Denies pallor, rash and wound.  Neurological: Denies dizziness, syncope, weakness, light-headedness, numbness and headaches.    Objective:  Physical Exam: Filed Vitals:   12/10/11 1342  BP: 126/88  Pulse: 77  Temp: 97.1 F (36.2 C)  TempSrc: Oral  Height: 4\' 9"  (1.448 m)  Weight: 173 lb 9.6 oz (78.744 kg)  SpO2: 97%   Constitutional: Vital signs reviewed.  Patient is a well-developed and well-nourished  in no acute distress and cooperative with exam. Alert  and oriented x3.  Head: Normocephalic and atraumatic Neck: Supple, Trachea midline normal ROM, No JVD, mass, thyromegaly, or carotid bruit present.  No erythema. Lipoma noted with 1 inch in diameter. Mild tenderness when examining the neck area. Numbness and tingling can not be aggravated with movement.  Cardiovascular: RRR, S1 normal, S2 normal, no MRG, pulses symmetric and intact bilaterally Pulmonary/Chest: CTAB, no wheezes, rales, or rhonchi Abdominal: Soft. Non-tender, non-distended, bowel sounds are normal, no masses, organomegaly, or guarding present.  GU: no CVA tenderness Musculoskeletal: No joint deformities, erythema, or stiffness, ROM full and no nontender Neurological: A&O x3, Strenght is normal and symmetric bilaterally, cranial nerve II-XII are grossly intact, no focal motor deficit, sensory intact to light touch bilaterally.  Skin: Warm, dry and intact. No rash, cyanosis, or clubbing.

## 2011-12-11 ENCOUNTER — Other Ambulatory Visit: Payer: Self-pay | Admitting: *Deleted

## 2011-12-21 ENCOUNTER — Other Ambulatory Visit: Payer: Self-pay | Admitting: Internal Medicine

## 2011-12-21 DIAGNOSIS — Z1231 Encounter for screening mammogram for malignant neoplasm of breast: Secondary | ICD-10-CM

## 2012-01-05 ENCOUNTER — Ambulatory Visit
Admission: RE | Admit: 2012-01-05 | Discharge: 2012-01-05 | Disposition: A | Discharge status: Home / Self Care | Payer: Self-pay | Source: Ambulatory Visit | Attending: Internal Medicine | Admitting: Internal Medicine

## 2012-01-05 DIAGNOSIS — Z1231 Encounter for screening mammogram for malignant neoplasm of breast: Secondary | ICD-10-CM

## 2012-01-07 NOTE — Telephone Encounter (Signed)
Opened in error

## 2012-02-11 ENCOUNTER — Other Ambulatory Visit: Payer: Self-pay | Admitting: *Deleted

## 2012-02-11 DIAGNOSIS — K219 Gastro-esophageal reflux disease without esophagitis: Secondary | ICD-10-CM

## 2012-02-11 MED ORDER — ESOMEPRAZOLE MAGNESIUM 40 MG PO CPDR: 40.0000 mg | DELAYED_RELEASE_CAPSULE | Freq: Two times a day (BID) | ORAL | Status: DC

## 2012-02-11 MED ORDER — ESOMEPRAZOLE MAGNESIUM 40 MG PO CPDR
40.0000 mg | DELAYED_RELEASE_CAPSULE | Freq: Two times a day (BID) | ORAL | Status: DC
Start: 2012-02-11 — End: 2012-03-23

## 2012-02-29 ENCOUNTER — Encounter: Payer: Self-pay | Admitting: Internal Medicine

## 2012-02-29 ENCOUNTER — Ambulatory Visit (INDEPENDENT_AMBULATORY_CARE_PROVIDER_SITE_OTHER): Payer: Self-pay | Admitting: Internal Medicine

## 2012-02-29 VITALS — BP 149/92 | HR 79 | Temp 98.3°F | Ht <= 58 in | Wt 171.3 lb

## 2012-02-29 DIAGNOSIS — M62838 Other muscle spasm: Secondary | ICD-10-CM

## 2012-02-29 DIAGNOSIS — I10 Essential (primary) hypertension: Secondary | ICD-10-CM

## 2012-02-29 DIAGNOSIS — E785 Hyperlipidemia, unspecified: Secondary | ICD-10-CM

## 2012-02-29 DIAGNOSIS — E119 Type 2 diabetes mellitus without complications: Secondary | ICD-10-CM

## 2012-02-29 DIAGNOSIS — G609 Hereditary and idiopathic neuropathy, unspecified: Secondary | ICD-10-CM

## 2012-02-29 LAB — COMPREHENSIVE METABOLIC PANEL
ALT: 14 U/L (ref 0–35)
Albumin: 3.7 g/dL (ref 3.5–5.2)
BUN: 9 mg/dL (ref 6–23)
CO2: 28 mEq/L (ref 19–32)
Calcium: 9.3 mg/dL (ref 8.4–10.5)
Chloride: 102 mEq/L (ref 96–112)
Creat: 0.84 mg/dL (ref 0.50–1.10)
Potassium: 3.7 mEq/L (ref 3.5–5.3)

## 2012-02-29 LAB — GLUCOSE, CAPILLARY: Glucose-Capillary: 160 mg/dL — ABNORMAL HIGH (ref 70–99)

## 2012-02-29 MED ORDER — CYCLOBENZAPRINE HCL 10 MG PO TABS: 10.0000 mg | ORAL_TABLET | Freq: Three times a day (TID) | ORAL | Status: DC | PRN

## 2012-02-29 NOTE — Assessment & Plan Note (Signed)
Her feet pain is most likely due to diabetic neuropathy. Since patient has no insurance she  would not be able to Gabapentin which most likely will help her pain. Therefore I need to hold off at this point.

## 2012-02-29 NOTE — Assessment & Plan Note (Signed)
Will refere  patient to ophthalmology

## 2012-02-29 NOTE — Progress Notes (Signed)
Subjective:   Patient ID: Nancy [name] female   DOB: 1956-07-19 56 y.o.   MRN: [id number]  HPI: [name] is a 56 y.o. female with past medical history significant as outlined below who presents to the clinic for regular office visit. Patient reports that her mood was down since she was experiencing pay cuts and noted to have trouble to afford  her medication and her apartment. She further reports to about  muscle spasm throughout her body which can occur any time. She cannot pinpoint any aggravating or alleviating factors. She also noted that she is having feet pain bilaterally. She describes it as pins and needles which comes and goes. Again she can not pinpoint  or alleviating factors.    Past Medical History  Diagnosis Date  . Diabetic peripheral neuropathy   . Carpal tunnel syndrome, bilateral     confirmed on nerve conduction studies  . Hyperlipidemia   . Diabetes mellitus type 2, uncontrolled   . GERD (gastroesophageal reflux disease)   . Heat edema   . History of Helicobacter infection 11/07  . History of chest pain     negative cardiolite September 2007.  ETT-myoview 12/10 with EF 76%, no ischemia,   . History of endometriosis    Current Outpatient Prescriptions  Medication Sig Dispense Refill  . aspirin 81 MG EC tablet Take 81 mg by mouth daily.        Marland Kitchen atorvastatin (LIPITOR) 20 MG tablet Take 1 tablet (20 mg total) by mouth daily.  90 tablet  1  . Cholecalciferol (VITAMIN D3) 1000 UNITS CAPS Take 1 capsule (1,000 Units total) by mouth daily.  30 capsule  2  . esomeprazole (NEXIUM) 40 MG capsule Take 1 capsule (40 mg total) by mouth 2 (two) times daily.  60 capsule  0  . fish oil-omega-3 fatty acids 1000 MG capsule Take 2 g by mouth daily.        Marland Kitchen glucose blood (TRUETRACK TEST) test strip Use to test blood sugar 4 times a day before meals and bedtime       . hydrochlorothiazide (HYDRODIURIL) 25 MG tablet Take 1 tablet (25 mg total) by mouth daily.  30 tablet   2  . insulin aspart (NOVOLOG FLEXPEN) 100 UNIT/ML injection Inject 10 Units into the skin 3 (three) times daily before meals. 7 units; 10 minutes before each meal ( breakfast, lunch and supper) 3 times a day. If you skip a meal, skip the injection.  10 mL  3  . insulin glargine (LANTUS) 100 UNIT/ML injection Inject 47 Units into the skin every morning. When you wake up  30 mL  1  . Insulin Pen Needle (RELION MINI PEN NEEDLES) 31G X 6 MM MISC Use to take insulin injections 3 times a day before meals       . Lancets MISC Use to check blood sugar 4 times a day       . lisinopril (PRINIVIL,ZESTRIL) 20 MG tablet Take 1 tablet (20 mg total) by mouth daily.  30 tablet  3  . metFORMIN (GLUCOPHAGE) 1000 MG tablet Take 1 tablet (1,000 mg total) by mouth 2 (two) times daily with a meal.  60 tablet  3   Family History  Problem Relation Age of Onset  . Diabetes Mother   . Colon cancer Neg Hx   . Lung cancer Maternal Uncle   . Breast cancer Maternal Grandmother   . Liver cancer Maternal Grandfather    History   Social History  .  Marital Status: Single    Spouse Name: N/A    Number of Children: 0  . Years of Education: N/A   Occupational History  . CNA    Social History Main Topics  . Smoking status: Never Smoker   . Smokeless tobacco: Never Used  . Alcohol Use: No  . Drug Use: No  . Sexually Active: None   Other Topics Concern  . None   Social History Narrative   Financial assistance approved for 100% discount at [facility] and has [facility] card per Chilon Boone10/21/2011   Review of Systems: Constitutional: Denies fever, chills, diaphoresis, appetite change and fatigue.  Respiratory: Denies SOB, DOE, cough, chest tightness,  and wheezing.   Cardiovascular: Denies chest pain, palpitations and leg swelling.  Gastrointestinal: Denies nausea, vomiting, abdominal pain, diarrhea, constipation, Genitourinary: Denies dysuria, urgency, frequency, hematuria, flank pain and difficulty urinating.   Skin:  Denies pallor, rash and wound.  Neurological: Denies dizziness, Psychiatric/Behavioral: Denies suicidal ideation,   Objective:  Physical Exam: Filed Vitals:   02/29/12 1500  BP: 149/92  Pulse: 79  Temp: 98.3 F (36.8 C)  TempSrc: Oral  Height: 4\' 9"  (1.448 m)  Weight: 171 lb 4.8 oz (77.701 kg)  SpO2: 100%   Constitutional: Vital signs reviewed.  Patient is a well-developed and well-nourished  in no acute distress and cooperative with exam. Alert and oriented x3.  Neck: Supple,  Cardiovascular: RRR, S1 normal, S2 normal, no MRG, pulses symmetric and intact bilaterally Pulmonary/Chest: CTAB, no wheezes, rales, or rhonchi Abdominal: Soft. Non-tender, non-distended, bowel sounds are normal,  Musculoskeletal: No joint deformities, erythema, or stiffness, ROM full and no nontender Neurological: A&O x3,  no focal motor deficit, sensory intact to light touch bilaterally.  Skin: Warm, dry and intact. No rash, cyanosis, or clubbing.

## 2012-02-29 NOTE — Assessment & Plan Note (Signed)
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 I recommended exercises and advised to take Flexeril only as needed basis.

## 2012-02-29 NOTE — Assessment & Plan Note (Signed)
The patient's blood pressure continues to be elevated even after multiple measurements. Patient reports taking all her medication on a regular basis. I will obtain basic metabolic count to evaluate renal function and electrolytes and make considering to increase lisinopril. I will have her come back in 2 weeks to reevaluate her blood pressure.

## 2012-03-01 NOTE — Telephone Encounter (Signed)
Please review dr Marinda Elk suggestion attached to this request

## 2012-03-14 ENCOUNTER — Encounter: Payer: Self-pay | Admitting: Internal Medicine

## 2012-03-17 NOTE — Telephone Encounter (Signed)
Patient need to be evaluated during next office visit for the need of Nexium BID.  Thanks Rosalia Hammers

## 2012-03-23 ENCOUNTER — Other Ambulatory Visit: Payer: Self-pay | Admitting: *Deleted

## 2012-03-23 ENCOUNTER — Encounter: Payer: Self-pay | Admitting: Internal Medicine

## 2012-03-23 DIAGNOSIS — K219 Gastro-esophageal reflux disease without esophagitis: Secondary | ICD-10-CM

## 2012-03-25 MED ORDER — ESOMEPRAZOLE MAGNESIUM 40 MG PO CPDR: 40.0000 mg | DELAYED_RELEASE_CAPSULE | Freq: Two times a day (BID) | ORAL | Status: DC

## 2012-03-25 NOTE — Telephone Encounter (Signed)
Nexium refilled -  rx request form faxed to [facility].

## 2012-03-29 ENCOUNTER — Ambulatory Visit (INDEPENDENT_AMBULATORY_CARE_PROVIDER_SITE_OTHER): Payer: Self-pay | Admitting: Internal Medicine

## 2012-03-29 ENCOUNTER — Encounter: Payer: Self-pay | Admitting: Internal Medicine

## 2012-03-29 VITALS — BP 121/86 | HR 84 | Temp 96.9°F | Ht <= 58 in | Wt 165.7 lb

## 2012-03-29 DIAGNOSIS — Z79899 Other long term (current) drug therapy: Secondary | ICD-10-CM

## 2012-03-29 DIAGNOSIS — E119 Type 2 diabetes mellitus without complications: Secondary | ICD-10-CM

## 2012-03-29 DIAGNOSIS — I10 Essential (primary) hypertension: Secondary | ICD-10-CM

## 2012-03-29 DIAGNOSIS — E1149 Type 2 diabetes mellitus with other diabetic neurological complication: Secondary | ICD-10-CM

## 2012-03-29 LAB — POCT GLYCOSYLATED HEMOGLOBIN (HGB A1C): Hemoglobin A1C: 8

## 2012-03-29 MED ORDER — LISINOPRIL 20 MG PO TABS: 20.0000 mg | ORAL_TABLET | Freq: Every day | ORAL | Status: DC

## 2012-03-29 MED ORDER — HYDROCHLOROTHIAZIDE 25 MG PO TABS: 25.0000 mg | ORAL_TABLET | Freq: Every day | ORAL | Status: DC

## 2012-03-29 MED ORDER — METFORMIN HCL 1000 MG PO TABS: 1000.0000 mg | ORAL_TABLET | Freq: Two times a day (BID) | ORAL | Status: DC

## 2012-03-29 MED ORDER — INSULIN GLARGINE 100 UNIT/ML ~~LOC~~ SOLN: 47.0000 [IU] | SUBCUTANEOUS | Status: DC

## 2012-03-29 MED ORDER — INSULIN ASPART 100 UNIT/ML ~~LOC~~ SOLN: 10.0000 [IU] | Freq: Three times a day (TID) | SUBCUTANEOUS | Status: DC

## 2012-03-29 NOTE — Progress Notes (Signed)
Patient ID: Nancy [name], female   DOB: 1956/10/21, 56 y.o.   MRN: [id number] HPI:    1. DM, type2. Patient did not bring her meter with her. States that does not check her CBG's as instructed "because gets upset with high sugars."  Denies any hypoglycemia. Reports inconsistent diet high in carbohydrates. Requests refills. No other concerns. Review of Systems: Negative except per history of present illness  Physical Exam:  Nursing notes and vitals reviewed General:  alert, well-developed, and cooperative to examination.   Lungs:  normal respiratory effort, no accessory muscle use, normal breath sounds, no crackles, and no wheezes. Heart:  normal rate, regular rhythm, no murmurs, no gallop, and no rub.   Abdomen:  soft, non-tender, normal bowel sounds, no distention, no guarding, no rebound tenderness, no hepatomegaly, and no splenomegaly.   Extremities:  No cyanosis, clubbing, edema Neurologic:  alert & oriented X3, nonfocal exam  Meds:  (Not in a hospital admission)  Allergies: Amitriptyline hcl Past Medical History  Diagnosis Date  . Diabetic peripheral neuropathy   . Carpal tunnel syndrome, bilateral     confirmed on nerve conduction studies  . Hyperlipidemia   . Diabetes mellitus type 2, uncontrolled   . GERD (gastroesophageal reflux disease)   . Heat edema   . History of Helicobacter infection 11/07  . History of chest pain     negative cardiolite September 2007.  ETT-myoview 12/10 with EF 76%, no ischemia,   . History of endometriosis    Past Surgical History  Procedure Date  . Abdominal hysterectomy    Family History  Problem Relation Age of Onset  . Diabetes Mother   . Colon cancer Neg Hx   . Lung cancer Maternal Uncle   . Breast cancer Maternal Grandmother   . Liver cancer Maternal Grandfather    History   Social History  . Marital Status: Single    Spouse Name: N/A    Number of Children: 0  . Years of Education: N/A   Occupational History  . CNA     Social History Main Topics  . Smoking status: Never Smoker   . Smokeless tobacco: Never Used  . Alcohol Use: No  . Drug Use: No  . Sexually Active: Not on file   Other Topics Concern  . Not on file   Social History Narrative   Financial assistance approved for 100% discount at [facility] and has [facility] card per Chilon Boone10/21/2011    A/P: 1. DM, type 2 -poorly controlled due to patient's poor adherence with a treatment regimen including diet. -Strongly advised to check her CBG's as instructed and bring her meter with each doctor's appointment. - 15 min counseling on risks of untreated DM. -increase Novolog up to 10 Units SQ tid ac as was previously advised. -continue with Lantus  And Metformin -foot care reviewed -referral for an eye exam -f/U in 2 months

## 2012-03-29 NOTE — Progress Notes (Signed)
HCTZ, Metformin, and Lisinopril rxs called to [facility] on [address].

## 2012-03-29 NOTE — Patient Instructions (Signed)
Please, follow up in 2 months with Dr. Newt Lukes and call with any questions.

## 2012-04-07 ENCOUNTER — Telehealth: Payer: Self-pay | Admitting: *Deleted

## 2012-04-07 DIAGNOSIS — E1149 Type 2 diabetes mellitus with other diabetic neurological complication: Secondary | ICD-10-CM

## 2012-04-07 DIAGNOSIS — E119 Type 2 diabetes mellitus without complications: Secondary | ICD-10-CM

## 2012-04-07 NOTE — Telephone Encounter (Signed)
Call from [place] for clarification of directions on novolog insulin. Directions say to inject 10 units tid before meals, inject 7 units tid before meals.From EMR notes I assume you wanted just the 10 units tid.  Please check and let me know.  I will call and change if 10 units is not correct.

## 2012-04-11 MED ORDER — INSULIN ASPART 100 UNIT/ML ~~LOC~~ SOLN: 7.0000 [IU] | Freq: Three times a day (TID) | SUBCUTANEOUS | Status: DC

## 2012-05-06 ENCOUNTER — Other Ambulatory Visit: Payer: Self-pay | Admitting: *Deleted

## 2012-05-06 DIAGNOSIS — E785 Hyperlipidemia, unspecified: Secondary | ICD-10-CM

## 2012-05-06 MED ORDER — ATORVASTATIN CALCIUM 20 MG PO TABS: 20.0000 mg | ORAL_TABLET | Freq: Every day | ORAL | Status: DC

## 2012-05-10 NOTE — Telephone Encounter (Signed)
rX FAXED IN

## 2012-05-17 ENCOUNTER — Ambulatory Visit (INDEPENDENT_AMBULATORY_CARE_PROVIDER_SITE_OTHER): Payer: Self-pay | Admitting: *Deleted

## 2012-05-17 DIAGNOSIS — Z111 Encounter for screening for respiratory tuberculosis: Secondary | ICD-10-CM

## 2012-07-11 ENCOUNTER — Other Ambulatory Visit: Payer: Self-pay | Admitting: *Deleted

## 2012-07-11 DIAGNOSIS — K219 Gastro-esophageal reflux disease without esophagitis: Secondary | ICD-10-CM

## 2012-07-12 MED ORDER — ESOMEPRAZOLE MAGNESIUM 40 MG PO CPDR: 40.0000 mg | DELAYED_RELEASE_CAPSULE | Freq: Every day | ORAL | Status: DC

## 2012-07-12 NOTE — Telephone Encounter (Signed)
Yes , I wanted to change to once a day. We can give her 90 tablets for a 3 month supply.  Thanks  [employer]

## 2012-07-12 NOTE — Telephone Encounter (Signed)
Pt has been getting Nexium 40 mg 1 capsule twice a day.  Did you change to once a day ?? Just want to clarify before I sent in the Rx. It's the [facility] so would like a 3 month supply. ? # [phone]

## 2012-07-13 NOTE — Telephone Encounter (Signed)
Rx faxed in.

## 2012-08-02 ENCOUNTER — Encounter: Payer: Self-pay | Admitting: Internal Medicine

## 2012-08-04 ENCOUNTER — Ambulatory Visit (INDEPENDENT_AMBULATORY_CARE_PROVIDER_SITE_OTHER): Payer: Self-pay | Admitting: Internal Medicine

## 2012-08-04 ENCOUNTER — Encounter: Payer: Self-pay | Admitting: Internal Medicine

## 2012-08-04 VITALS — BP 115/82 | HR 90 | Temp 97.2°F | Ht <= 58 in | Wt 163.2 lb

## 2012-08-04 DIAGNOSIS — E1149 Type 2 diabetes mellitus with other diabetic neurological complication: Secondary | ICD-10-CM

## 2012-08-04 DIAGNOSIS — G5603 Carpal tunnel syndrome, bilateral upper limbs: Secondary | ICD-10-CM

## 2012-08-04 DIAGNOSIS — E119 Type 2 diabetes mellitus without complications: Secondary | ICD-10-CM

## 2012-08-04 DIAGNOSIS — G5600 Carpal tunnel syndrome, unspecified upper limb: Secondary | ICD-10-CM

## 2012-08-04 DIAGNOSIS — I10 Essential (primary) hypertension: Secondary | ICD-10-CM

## 2012-08-04 DIAGNOSIS — Z79899 Other long term (current) drug therapy: Secondary | ICD-10-CM

## 2012-08-04 LAB — LIPID PANEL
HDL: 38 mg/dL — ABNORMAL LOW (ref >39)
LDL Cholesterol: 88 mg/dL (ref 0–99)
Total CHOL/HDL Ratio: 3.9 Ratio
Triglycerides: 110 mg/dL (ref <150)
VLDL: 22 mg/dL (ref 0–40)

## 2012-08-04 LAB — GLUCOSE, CAPILLARY: Glucose-Capillary: 300 mg/dL — ABNORMAL HIGH (ref 70–99)

## 2012-08-04 MED ORDER — METFORMIN HCL 1000 MG PO TABS: 1000.0000 mg | ORAL_TABLET | Freq: Two times a day (BID) | ORAL | Status: DC

## 2012-08-04 MED ORDER — LISINOPRIL 20 MG PO TABS: 20.0000 mg | ORAL_TABLET | Freq: Every day | ORAL | Status: DC

## 2012-08-04 MED ORDER — HYDROCHLOROTHIAZIDE 25 MG PO TABS: 25.0000 mg | ORAL_TABLET | Freq: Every day | ORAL | Status: DC

## 2012-08-04 MED ORDER — CLOTRIMAZOLE 1 % VA CREA: TOPICAL_CREAM | VAGINAL | Status: DC

## 2012-08-04 NOTE — Patient Instructions (Signed)
We would like you to stop using soap to wash your sensitive areas. Just use water and a little gentle cleansing. We will give you a cream to use on the itching areas. Try not to scratch. Also, getting your sugars under better control will help a lot. Try to cut out sodas. I hope that your friend is going to get better and I'll keep both of you in my thoughts. Come back in 1 month to check on your sugars and if you have questions or problems before then please call us at [phone].  1800 Calorie Diabetic Diet The 1800 calorie diabetic diet is designed for eating up to 1800 calories each day. Following this diet and making healthy meal choices can help improve overall health. It controls blood glucose (sugar) levels, and it can also help lower blood pressure and cholesterol. SERVING SIZES Measuring foods and serving sizes helps to make sure you are getting the right amount of food. The list below tells how big or small some common serving sizes are:  1 oz.........4 stacked dice.   3 oz........Marland KitchenDeck of cards.   1 tsp.......Marland KitchenTip of little finger.   1 tbs......Marland KitchenMarland KitchenThumb.   2 tbs.......Marland KitchenGolf ball.    cup......Marland KitchenHalf of a fist.   1 cup.......Marland KitchenA fist.  GUIDELINES FOR CHOOSING FOODS The goal of this diet is to eat a variety of foods and limit calories to 1800 each day. This can be done by choosing foods that are low in calories and fat. The diet also suggests eating small amounts of food frequently. Doing this helps control your blood glucose levels so they do not get too high or too low. Each meal or snack may include a protein food source to help you feel more satisfied. Try to eat about the same amount of food around the same time each day. This includes weekend days, travel days, and days off work. Space your meals about 4 to 5 hours apart, and add a snack between them, if you wish.  For example, a daily food plan could include breakfast, a morning snack, lunch, dinner, and an evening snack.  Healthy meals and snacks have different types of foods, including whole grains, vegetables, fruits, lean meats, poultry, fish, and dairy products. As you plan your meals, select a variety of foods. Choose from the bread and starch, vegetable, fruit, dairy, and meat/protein groups. Examples of foods from each group are listed below with their suggested serving sizes. Use measuring cups and spoons to become familiar with what a healthy portion looks like. Bread and Starch Each serving equals 15 grams of carbohydrates.  1 slice bread.    bagel.    cup cold cereal (unsweetened).    cup hot cereal or mashed potatoes.   1 small potato (size of a computer mouse).   ? cup cooked pasta or rice.    English muffin.   1 cup broth-based soup.   3 cups of popcorn.   4 to 6 whole-wheat crackers.    cup cooked beans, peas, or corn.  Vegetables Each serving equals 5 grams of carbohydrates.   cup cooked vegetables.   1 cup raw vegetables.    cup tomato or vegetable juice.  Fruit Each serving equals 15 grams of carbohydrates.  1 small apple or orange.   1  cup watermelon or strawberries.    cup applesauce (no sugar added).   2 tbs raisins.    banana.    cup canned fruit, packed in water or in its own juice.  cup unsweetened fruit juice.  Dairy Each serving equals 12 to 15 grams of carbohydrates.  1 cup fat-free milk.   6 oz artificially sweetened yogurt or plain yogurt.   1 cup low-fat buttermilk.   1 cup soy milk.   1 cup almond milk.  Meat/Protein  1 large egg.   2 to 3 oz meat, poultry, or fish.    cup low-fat cottage cheese.   1 tbs peanut butter.   1 oz low-fat cheese.    cup tuna, packed in water.    cup tofu.  Fat  1 tsp oil.   1 tsp trans-fat-free margarine.   1 tsp butter.   1 tsp mayonnaise.   2 tbs avocado.   1 tbs salad dressing.   1 tbs cream cheese.   2 tbs sour cream.  SAMPLE 1800 CALORIE DIET  PLAN Breakfast   cup unsweetened cereal (1 carb serving).   1 cup fat-free milk (1 carb serving).   1 slice whole-wheat toast (1 carb serving).    small banana (1 carb serving).   1 scrambled egg.   1 tsp trans-fat-free margarine.  Lunch  Tuna sandwich.   2 slices whole-wheat bread (2 carb servings).    cup canned tuna in water, drained.   1 tbs reduced fat mayonnaise.   1 stalk celery, chopped.   2 slices tomato.   1 lettuce leaf.   1 cup carrot sticks.   24 to 30 seedless grapes (2 carb servings).   6 oz light yogurt (1 carb serving).  Afternoon Snack  3 graham cracker squares (1 carb serving).   1 cup fat-free milk (1 carb serving).   1 tbs peanut butter.  Dinner  3 oz salmon, broiled with 1 tsp oil.   1 cup mashed potatoes (2 carb servings) with 1 tsp trans-fat-free margarine.   1 cup fresh or frozen green beans.   1 cup steamed asparagus.   1 cup fat-free milk (1 carb serving).  Evening Snack  3 cups of air-popped popcorn (1 carb serving).   2 tbs Parmesan cheese.  Meal Plan You can use this worksheet to help you make a daily meal plan based on the 1800 calorie diabetic diet suggestions. If you are using this plan to help you control your blood glucose, you may interchange carbohydrate-containing foods (dairy, starches, and fruits). Select a variety of fresh foods of varying colors and flavors. The total amount of carbohydrate in your meals or snacks is more important than making sure you include all of the food groups every time you eat. Choose from the approximate amount of the following foods to build your day's meals:  8 Starches.   4 Vegetables.   3 Fruits.   2 Dairy.   6 to 7 oz Meat/Protein.   Up to 4 Fats.  Your dietician can use this worksheet to help you decide how many servings and which types of foods are right for you. BREAKFAST Food Group and Servings / Food Choice Starches  _______________________________________________________ Dairy __________________________________________________________ Fruit ___________________________________________________________ Meat/Protein ____________________________________________________ Fat ____________________________________________________________ LUNCH Food Group and Servings / Food Choice Starch _________________________________________________________ Meat/Protein ___________________________________________________ Vegetables _____________________________________________________ Fruit __________________________________________________________ Dairy __________________________________________________________ Fat ____________________________________________________________ Nancy [name] Food Group and Servings / Food Choice Starch ________________________________________________________ Meat/Protein ___________________________________________________ Fruit __________________________________________________________ Dairy __________________________________________________________ Nancy [name] Food Group and Servings / Food Choice Starches _______________________________________________________ Meat/Protein ___________________________________________________ Dairy __________________________________________________________ Vegetable ______________________________________________________ Fruit ___________________________________________________________ Fat ____________________________________________________________ Nancy [name] Food Group and Servings / Food Choice Fruit __________________________________________________________ Meat/Protein ___________________________________________________ Dairy __________________________________________________________ Starch _________________________________________________________  DAILY TOTALS Starches _________________________ Vegetables _______________________ Fruits  ____________________________ Dairy ____________________________ Meat/Protein_____________________ Fats _____________________________ Document Released: 06/15/2005 Document Revised: 11/12/2011 Document Reviewed: 10/09/2011 [facility] Patient Information 2012 [employer].

## 2012-08-05 NOTE — Assessment & Plan Note (Addendum)
Last hemoglobin A1c was 8 and today's hemoglobin A1c was 13.2. She states that she has been having more dietary indiscretions and has not been consistently taking her medications. I advised her that she needs to be taking her medications more consistently and that she does need to work on her diet. She states that she has a lot of problems with sodas and I have informed her that she should try incorporating a little bit of water in. We did not have significant time to discuss her diabetes and therefore she will be back in one month for close followup and for diabetic management. She was advised to take NovoLog 10 units 3 times a day, Lantus 47 units in the morning, Glucophage 1000 mg twice daily. She was also informed that she can take the Glucophage if she does not eat. Strongly recommended she bring her meter to next visit.

## 2012-08-05 NOTE — Assessment & Plan Note (Signed)
Patient continues to have significant pain and occasional swelling. She stated that she may need surgery in the future. We'll defer further management to her PCP and can be addressed at followup visit.

## 2012-08-05 NOTE — Progress Notes (Signed)
Subjective:     Patient ID: Nancy [name], female   DOB: [birth year], 56 y.o.   MRN: [id number]  HPI The patient comes in today for a followup visit and is having some acute hand pain at today's visit. She is also due for some checks of her diabetes. She states that her sugars have been elevated recently and she did not bring her meter in for review at today's visit. She is also having some burning in her perineal area. She states that she does use dial soap down there quite frequently. She thinks that this has caused the skin to be dry and she is having a lot of itching. She's not really having any discharge vaginally. She states that there is a little bit of white plaque on the skin. She is slightly uncomfortable talking about this. She's not had any new sexual partners. No change in urinary frequency or urgency. No abdominal pain or genital sores. The hand pain that she is having is related to carpal tunnel syndrome. She states that she's got injections before in the past which did not help. She states that she was told she may need surgery in the future. No other complaints at today's visit. She states that she has been eating quite a lot more and quite a lot of bad foods for her diabetes. She has not been exercising. Patient denies hypoglycemic events.  Review of Systems  Constitutional: Negative for fever, chills, diaphoresis, activity change, appetite change, fatigue and unexpected weight change.  HENT: Negative.   Eyes: Negative.   Respiratory: Negative for cough, chest tightness, shortness of breath and wheezing.   Cardiovascular: Negative for chest pain, palpitations and leg swelling.  Gastrointestinal: Negative for nausea, vomiting, abdominal pain, diarrhea, constipation and abdominal distention.  Genitourinary: Positive for frequency. Negative for dysuria, urgency, hematuria, flank pain, decreased urine volume, vaginal bleeding, vaginal discharge, enuresis, difficulty urinating, genital  sores, vaginal pain, menstrual problem, pelvic pain and dyspareunia.  Musculoskeletal: Positive for myalgias, joint swelling and arthralgias. Negative for back pain and gait problem.       Patient states wrists occasionally swell.  Skin: Positive for color change. Negative for pallor, rash and wound.  Neurological: Negative for dizziness, seizures, speech difficulty, weakness, light-headedness and headaches.  Hematological: Negative.   Psychiatric/Behavioral: Negative.        Objective:   Physical Exam  Constitutional: She is oriented to person, place, and time. She appears well-developed and well-nourished. No distress.  HENT:  Head: Normocephalic and atraumatic.  Eyes: EOM are normal. Pupils are equal, round, and reactive to light.  Neck: Normal range of motion. Neck supple.  Cardiovascular: Normal rate and regular rhythm.   No murmur heard. Pulmonary/Chest: Effort normal and breath sounds normal. No respiratory distress. She has no wheezes.  Abdominal: Soft. Bowel sounds are normal. She exhibits no distension and no mass. There is no tenderness. There is no rebound and no guarding.  Musculoskeletal: Normal range of motion. She exhibits tenderness. She exhibits no edema.       Tenderness in both wrists.  Neurological: She is alert and oriented to person, place, and time. No cranial nerve deficit.  Skin: Skin is warm and dry. No rash noted. No erythema. No pallor.       Patient did not want me to look at her perineal area however stated that there was some redness around the labial area.  Psychiatric: She has a normal mood and affect. Her behavior is normal.  Assessment/Plan:   1. Perineal itching-the patient was advised to not use soap in her perineal region. She was advised to use water and gentle cleansing. She was also given some clotrimazole cream to use. She was advised not to scratch if at all possible. Advised that if this does not resolve with the use of the cream that  she should let us know. She was unwilling to let me look at this area during our office visit. If it is not resolving by next visit it will need to be looked at. Advised that her high sugars may have something to do with yeast formation. Also stated that she should be controlling her sugars as a means to help control this.  2. Please see problem-oriented charting  3. Disposition-the patient was advised to be taking NovoLog 10 units 3 times a day. She was also advised to use her Lantus regularly and to take her metformin regularly. She states that she does not use any of her diabetic things when she does not eat. She was advised that she can take her metformin even when she does not eat. She'll be seen back in one month for close followup and further evaluation of her chronic medical problems as our time was limited by acute complaints at today's visit.

## 2012-09-01 ENCOUNTER — Ambulatory Visit: Payer: Self-pay | Admitting: Internal Medicine

## 2012-09-01 ENCOUNTER — Encounter: Payer: Self-pay | Admitting: Internal Medicine

## 2012-09-01 ENCOUNTER — Ambulatory Visit (INDEPENDENT_AMBULATORY_CARE_PROVIDER_SITE_OTHER): Payer: Self-pay | Admitting: Internal Medicine

## 2012-09-01 VITALS — BP 124/87 | HR 78 | Temp 97.0°F | Ht <= 58 in | Wt 167.2 lb

## 2012-09-01 DIAGNOSIS — M79671 Pain in right foot: Secondary | ICD-10-CM | POA: Insufficient documentation

## 2012-09-01 DIAGNOSIS — G5603 Carpal tunnel syndrome, bilateral upper limbs: Secondary | ICD-10-CM

## 2012-09-01 DIAGNOSIS — G5600 Carpal tunnel syndrome, unspecified upper limb: Secondary | ICD-10-CM

## 2012-09-01 DIAGNOSIS — M79609 Pain in unspecified limb: Secondary | ICD-10-CM

## 2012-09-01 DIAGNOSIS — I10 Essential (primary) hypertension: Secondary | ICD-10-CM

## 2012-09-01 DIAGNOSIS — E1149 Type 2 diabetes mellitus with other diabetic neurological complication: Secondary | ICD-10-CM

## 2012-09-01 DIAGNOSIS — E785 Hyperlipidemia, unspecified: Secondary | ICD-10-CM

## 2012-09-01 DIAGNOSIS — E119 Type 2 diabetes mellitus without complications: Secondary | ICD-10-CM

## 2012-09-01 DIAGNOSIS — M79672 Pain in left foot: Secondary | ICD-10-CM | POA: Insufficient documentation

## 2012-09-01 DIAGNOSIS — E1142 Type 2 diabetes mellitus with diabetic polyneuropathy: Secondary | ICD-10-CM

## 2012-09-01 LAB — GLUCOSE, CAPILLARY: Glucose-Capillary: 196 mg/dL — ABNORMAL HIGH (ref 70–99)

## 2012-09-01 MED ORDER — LANCETS MISC: Status: DC

## 2012-09-01 MED ORDER — METFORMIN HCL 1000 MG PO TABS: 1000.0000 mg | ORAL_TABLET | Freq: Two times a day (BID) | ORAL | Status: DC

## 2012-09-01 MED ORDER — GLUCOSE BLOOD VI STRP: ORAL_STRIP | Status: DC

## 2012-09-01 MED ORDER — INSULIN PEN NEEDLE 31G X 6 MM MISC: Status: DC

## 2012-09-01 NOTE — Assessment & Plan Note (Signed)
Last LDL 88, counseled on continuing to follow diabetic diet  -continue lipitor -continue to monitor -weight loss advised

## 2012-09-01 NOTE — Progress Notes (Signed)
Lancets, test strips, pen needles, and Metformin rxs faxed to [facility] per pt's request.

## 2012-09-01 NOTE — Assessment & Plan Note (Signed)
Lack of insight into proper nutrition and medication management.  Does not take novolog regurlarly and has only been taking total of 1000mg  daily of metformin along with lantus 47 units usually in the evenings.  Her A1c last month was13.2, significantly elevated from 5 months prior where it was noted to be 8.0.  She has also gained 4 pounds since her last clinic visit.  She does not usually have breakfast in the morning and thus does not take her medication for diabetes when she skips meals.  I have counseled her extensively on better diabetic management, and encouraged her to check her blood sugards which she admits to not do at all.  She claims to be out of her strips and is concerned about affordability.  It seems, Nancy [name] needs better medication regimen and she needs to implement diabetic diet in place as she has in the past.  She does not wish to discuss nutrition with Nancy [name] as i recommended however, said she is willing to meet with Nancy [name] for medication education.  In counseling her, I have explained in detail long acting vs short acting insulin and have encouraged her to take her metformin and lantus daily every morning and to at least check her blood sugar before dinner which is her main meal of the day and to give her self novolog with meals.  She says she will be better about checking her sugar and taking her medication.  She prefers to take her medication in the morning.  -continue current regimen, metformin 1000mg  daily, lantus 47 units daily, and novolog 7 units with meals -f/u Nancy [name] if she agrees -f/u blood glucose levels, encouraged to please bring her monitor to next visit -encouraged exercise and weight loss F/u with pcp--will need further adjustment of medication.  I discussed with Nancy [name], recommended metformin xr 2000mg  daily, however, that would mean she would need to take 4 pills of 500mg  each every morning in addition to the lantus which she did not wish to do at this  time.

## 2012-09-01 NOTE — Assessment & Plan Note (Signed)
Has tried steroid injections, wrist splints, and tried to be referred to neurosurgery or orthopedic surgery but lack of insurance is a factor.  Will try to refer to sports medicine today to see if can provide possible assistance and further evaluation.  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 Also complains of occasional swelling mainly of left wrist/forearm--encouraged to ice multiple times a day and says she has relief with ibuprofen prn.    -F/u sports medicine -f/u orthopedic surgery if possible with [facility] card at all

## 2012-09-01 NOTE — Assessment & Plan Note (Signed)
Complains of b/l foot pain under her foot, heels and balls of feet.  Claims to be on feet all day during work, does not have good shoes, cannot afford diabetic shoes. Denies any ulcers but does have occasional neuropathy.  Was going to purchase new tennis shoes today but the store was closed. I encouraged to also look at gel insoles for possible additional comfort and will refer to sports medicine for further evaluation.    -f/u sports medicine -rest feet as much as possible and massage -foot exams -change shoes with more support but understand cost is a major factor

## 2012-09-01 NOTE — Assessment & Plan Note (Addendum)
Stable. BP today 12487, adherent to BP medication  -continue Lisinopril and HCTZ

## 2012-09-01 NOTE — Patient Instructions (Addendum)
In the morning after you wake up regardless of eating, take Lantus 47 units and 1 tablet Metfomin 1000mg .  Then 12 or so hours later, take another pill 1000mg  of Metformin.  That equals total 2000mg  of Metformin a day.    Check blood sugar at least before dinner, and give novolog 7 units as needed and explained.    Check blood sugar as much as you can at least three times a day AND PLEASE BRING YOUR MONITOR WITH YOU NEXT VISIT  I have ordered your strips and supplies to health department  Continue blood pressure medicines  Follow up with sports medicine and orthopedic if we can schedule them  Follow up with social work  Return to clinic in one month with PCP  Consider seeing Butch Penny for medicine adjustment as we discussed
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
## 2012-09-01 NOTE — Assessment & Plan Note (Signed)
Mainly in her feet along with b/l foot pain due to work.  Occasional numbness and tingling, encouraged to work towards better glucose control Last A1c 13  -encouraged better glucose control and adherence to medication -referred to sports medicine for her foot pain -cannot afford diabetic shoes, encouraged her to look towards gel insoles for some possible comfort along with also discussing with sports medicine

## 2012-09-04 NOTE — Progress Notes (Signed)
I saw, examined, and discussed the patient with Dr Eula Fried and agree with the note contained here. She has failed conservative treatments for her CTS. She does have slight weakness of R hand grip but feels her L hand is worse. Due to finances, she has had difficulty obtaining surgical consultation.

## 2012-09-06 ENCOUNTER — Ambulatory Visit (INDEPENDENT_AMBULATORY_CARE_PROVIDER_SITE_OTHER): Payer: Self-pay | Admitting: Sports Medicine

## 2012-09-06 ENCOUNTER — Encounter: Payer: Self-pay | Admitting: Sports Medicine

## 2012-09-06 VITALS — BP 120/80 | HR 69 | Ht <= 58 in | Wt 167.0 lb

## 2012-09-06 DIAGNOSIS — G5600 Carpal tunnel syndrome, unspecified upper limb: Secondary | ICD-10-CM

## 2012-09-06 DIAGNOSIS — M79609 Pain in unspecified limb: Secondary | ICD-10-CM

## 2012-09-06 DIAGNOSIS — M79673 Pain in unspecified foot: Secondary | ICD-10-CM

## 2012-09-06 DIAGNOSIS — M216X9 Other acquired deformities of unspecified foot: Secondary | ICD-10-CM

## 2012-09-06 NOTE — Progress Notes (Signed)
  Subjective:    Patient ID: Nancy [name], female    DOB: 08-15-1956, 56 y.o.   MRN: [id number]  HPI [name] is coming into day for bilateral hand and foot pain.  Hand pain: Present for 4-5 years, has diagnosis of carpal tunnel based on EMG in 2012.  Reports that she has tried braces and cortisone injections in the past with no relief.  Reports constant pain throughout both hands and traveling in the wrists and forearms.  Swelling in the left wrist.  Numbness and tingling that comes and goes.  Reports weakness and dropping things with her right hand.  Foot pain: Located throughout soles of bilateral feet; described as "pins and needles" and "swollen feeling."  Reports first steps in the morning are the worst, then it eases off some but is present throughout the day.  Does describe some numbness and tingling as well.  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  Wears basic tennis shoes; has never tried insoles.  Past medical history significant for uncontrolled diabetes with recent A1c of 13.     Review of Systems     Objective:   Physical Exam Right hand: no erythema or edema, diffuse tenderness to palpation, no thenar wasting, 5/5 strength in interosseus and grip, sensation intact to light touch throughout, positive Tinel's/Phalen's Left hand: no erythema, mild swelling on wrist, diffuse tenderness to palpation, no thenar wasting, 5/5 strength in interosseus and grip, sensation intact to light tough throughout, positive Tinel's/Phalen's Right foot: no erythema, edema, shortened 4th metatarsal, diffuse tenderness to palpation over the sole including metatasals and plantar fascia as well as achilles tendon, splaying of mid-foot with weight bearing, intact arch, 5/5 plantar flexion and great toe extension Left foot: no erythema, edema, shortened 4th metatarsal, diffuse tenderness to palpation over the sole including metatasals and plantar fascia as well as achilles tendon, splaying of  mid-foot with weight bearing, intact arch, 5/5 plantar flexion and great toe extension  EMG 2012: right median neuropathy at the wrist rated as moderate, no evidence for left median neuropathy, no ulnar neuropathy bilaterally     Assessment & Plan:  Carpal tunnel: Likely bilateral but worse on right on EMG 18 months ago.  Failed conservative management with braces, medication and injection.  Evaluation for surgery difficult given that she has orange card for insurance.  Will put her in contact with the [facility] equivalent at [facility] and try to get her set up with a hand specialist there for surgery evaluation.  If unable to see someone at [facility], could consider repeat EMG to evaluate for progression.  At this point, surgery is really the treatment needed.  Follow up in 4 weeks.   Bilateral foot pain: Difficult to distinguish early diabetic neuropathy vs plantar fasciitis vs loss of transverse arch.  Provided diabetic insoles with metatarsal pad to help with mid foot splaying (to be used in whatever shoe she wears).  Will need close follow up with PCP to work on diabetic control.  Follow up in 4 weeks.

## 2012-09-08 ENCOUNTER — Telehealth: Payer: Self-pay | Admitting: Licensed Clinical Social Worker

## 2012-09-08 NOTE — Telephone Encounter (Signed)
Nancy [name] was referred to CSW for assistance with the disability application process.  CSW placed call to Nancy [name].  Pt states she has applied for disability twice, both time denied as well as denied from attorney representation.  Nancy [name] voiced concern regarding her employment if pt has to have surgery due to her carpal tunnel.  Pt states she would not be able to work after having a surgery and thus would leave her homeless.  Nancy [name] considers her main disability to be uncontrolled diabetes and carpal tunnel.  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  Pt states it is becoming more difficult to provide the hands on care due to her carpal tunnel.  CSW suggested Nancy [name] to explore health care occupations that are not hands on, pt has yet to explore this option or talk with agency regarding alternate placements.  During conversation, Nancy [name] requested CSW to call back in the morning to provide add'l information as pt was at work.  CSW will f/u in the am.

## 2012-09-09 NOTE — Telephone Encounter (Signed)
CSW placed called to pt.  CSW left message requesting return call. CSW provided contact hours and phone number. 

## 2012-09-15 ENCOUNTER — Telehealth: Payer: Self-pay | Admitting: Dietician

## 2012-09-15 ENCOUNTER — Other Ambulatory Visit: Payer: Self-pay | Admitting: *Deleted

## 2012-09-15 DIAGNOSIS — E1149 Type 2 diabetes mellitus with other diabetic neurological complication: Secondary | ICD-10-CM

## 2012-09-15 DIAGNOSIS — E119 Type 2 diabetes mellitus without complications: Secondary | ICD-10-CM

## 2012-09-15 MED ORDER — INSULIN GLARGINE 100 UNIT/ML ~~LOC~~ SOLN: 47.0000 [IU] | SUBCUTANEOUS | Status: DC

## 2012-09-15 NOTE — Telephone Encounter (Signed)
Rx called in to pharmacy. 

## 2012-09-16 NOTE — Telephone Encounter (Signed)
CSW returned call to [name].  Pt states she is waiting to hear back from the surgeon regarding her carpal tunnel.  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  Pt denies add'l needs at this time.  CSW will sign off.

## 2012-09-16 NOTE — Telephone Encounter (Signed)
Left message for patient to return call or schedule with CDE

## 2012-09-28 ENCOUNTER — Other Ambulatory Visit: Payer: Self-pay | Admitting: *Deleted

## 2012-09-28 DIAGNOSIS — K219 Gastro-esophageal reflux disease without esophagitis: Secondary | ICD-10-CM

## 2012-09-29 MED ORDER — ESOMEPRAZOLE MAGNESIUM 40 MG PO CPDR: 40.0000 mg | DELAYED_RELEASE_CAPSULE | Freq: Every day | ORAL | Status: DC

## 2012-09-29 NOTE — Telephone Encounter (Signed)
Rx called in 

## 2012-10-04 ENCOUNTER — Ambulatory Visit (INDEPENDENT_AMBULATORY_CARE_PROVIDER_SITE_OTHER): Payer: Self-pay | Admitting: Sports Medicine

## 2012-10-04 VITALS — BP 140/80 | Ht <= 58 in | Wt 167.0 lb

## 2012-10-04 DIAGNOSIS — M79609 Pain in unspecified limb: Secondary | ICD-10-CM

## 2012-10-04 DIAGNOSIS — M79671 Pain in right foot: Secondary | ICD-10-CM

## 2012-10-04 DIAGNOSIS — G5600 Carpal tunnel syndrome, unspecified upper limb: Secondary | ICD-10-CM

## 2012-10-04 NOTE — Progress Notes (Signed)
  Subjective:    Patient ID: Nancy [name], female    DOB: 02/20/1956, 56 y.o.   MRN: [id number]  HPI Patient comes in today for followup. Her feet feel much better with the sports insoles and metatarsal pads. She has almost no pain when wearing them in her tennis shoes. Only pain is when she goes barefoot. In regards to her carpal tunnel syndrome she has been unable to travel to [facility] and does not feel like she will get a chance anytime in the future. She and her mom are concerned about the possibility of worsening "nerve damage" from her carpal tunnel syndrome.    Review of Systems     Objective:   Physical Exam Well-developed, no acute distress  She has no tenderness to palpation across the metatarsal heads of either foot. No pain with metatarsal squeeze. Good dorsalis pedis and posterior tibial pulses.        Assessment & Plan:  1. Improved foot pain secondary to metatarsalgia with underlying diabetic neuropathy 2. Bilateral carpal tunnel syndrome  I've given her another pair of sports insoles with metatarsal pads to wear in her shoes. She needs to get better control of her diabetes. Since she is concerned about possible worsening nerve damage from her carpal tunnel syndrome I decided to repeat her EMG/nerve conduction study. Her last EMG was 1 year ago and showed evidence of mild to moderate carpal tunnel. I will call her at [phone] or [phone] with those results once available. Followup formally when necessary.

## 2012-10-04 NOTE — Addendum Note (Signed)
Addended by: Cyd Silence on: 10/04/2012 02:23 PM   Modules accepted: Orders

## 2012-10-11 ENCOUNTER — Encounter: Payer: Self-pay | Admitting: Physical Medicine & Rehabilitation

## 2012-11-01 ENCOUNTER — Ambulatory Visit: Payer: Self-pay

## 2012-11-15 ENCOUNTER — Ambulatory Visit: Payer: Self-pay

## 2012-11-28 ENCOUNTER — Other Ambulatory Visit: Payer: Self-pay | Admitting: *Deleted

## 2012-11-28 DIAGNOSIS — E119 Type 2 diabetes mellitus without complications: Secondary | ICD-10-CM

## 2012-11-28 DIAGNOSIS — I10 Essential (primary) hypertension: Secondary | ICD-10-CM

## 2012-11-28 MED ORDER — METFORMIN HCL 1000 MG PO TABS: 1000.0000 mg | ORAL_TABLET | Freq: Two times a day (BID) | ORAL | Status: DC

## 2012-11-28 MED ORDER — HYDROCHLOROTHIAZIDE 25 MG PO TABS: 25.0000 mg | ORAL_TABLET | Freq: Every day | ORAL | Status: DC

## 2012-11-28 MED ORDER — LISINOPRIL 20 MG PO TABS: 20.0000 mg | ORAL_TABLET | Freq: Every day | ORAL | Status: DC

## 2012-11-28 NOTE — Telephone Encounter (Signed)
Rx called in to pharmacy - Lisinopril.

## 2012-12-09 ENCOUNTER — Other Ambulatory Visit: Payer: Self-pay | Admitting: *Deleted

## 2012-12-09 DIAGNOSIS — E1149 Type 2 diabetes mellitus with other diabetic neurological complication: Secondary | ICD-10-CM

## 2012-12-09 DIAGNOSIS — E119 Type 2 diabetes mellitus without complications: Secondary | ICD-10-CM

## 2012-12-09 DIAGNOSIS — K219 Gastro-esophageal reflux disease without esophagitis: Secondary | ICD-10-CM

## 2012-12-09 MED ORDER — INSULIN ASPART 100 UNIT/ML ~~LOC~~ SOLN: 7.0000 [IU] | Freq: Three times a day (TID) | SUBCUTANEOUS | Status: DC

## 2012-12-09 MED ORDER — INSULIN GLARGINE 100 UNIT/ML ~~LOC~~ SOLN: 47.0000 [IU] | SUBCUTANEOUS | Status: DC

## 2012-12-09 MED ORDER — ESOMEPRAZOLE MAGNESIUM 40 MG PO CPDR: 40.0000 mg | DELAYED_RELEASE_CAPSULE | Freq: Every day | ORAL | Status: DC

## 2012-12-09 NOTE — Telephone Encounter (Signed)
Rx faxed in.

## 2012-12-13 ENCOUNTER — Encounter: Payer: Self-pay | Admitting: Internal Medicine

## 2012-12-13 ENCOUNTER — Ambulatory Visit (INDEPENDENT_AMBULATORY_CARE_PROVIDER_SITE_OTHER): Payer: No Typology Code available for payment source | Admitting: Internal Medicine

## 2012-12-13 VITALS — BP 158/92 | HR 68 | Temp 97.2°F | Ht 59.0 in | Wt 173.6 lb

## 2012-12-13 DIAGNOSIS — Z79899 Other long term (current) drug therapy: Secondary | ICD-10-CM

## 2012-12-13 DIAGNOSIS — K5900 Constipation, unspecified: Secondary | ICD-10-CM

## 2012-12-13 DIAGNOSIS — E119 Type 2 diabetes mellitus without complications: Secondary | ICD-10-CM

## 2012-12-13 LAB — GLUCOSE, CAPILLARY: Glucose-Capillary: 223 mg/dL — ABNORMAL HIGH (ref 70–99)

## 2012-12-13 MED ORDER — MINERAL OIL RE ENEM: 1.0000 | ENEMA | Freq: Once | RECTAL | Status: DC

## 2012-12-13 NOTE — Patient Instructions (Signed)
Please pick up the enemas at the pharmacy. I have written for 3 if needed.  If it's is not successful then call the clinic and we will write for the colon prep.  If you develop bad vomiting, blood in your stools or mucus, you were unable to pass gas or stool and please return to the emergency department to be evaluated.  It was nice to meet you please stay warm.

## 2012-12-13 NOTE — Progress Notes (Signed)
Subjective:   Patient ID: Nancy [name] female   DOB: [birth year] 57 y.o.   MRN: [id number]  HPI: [name] is a 57 y.o. woman pmh diabetes uncontrolled with subsequent neuropathy, GERD, and hypertension who presents to the clinic acutely for constipation. Patient states that for the past 2 weeks she's not been able to stool regularly and is having increased loading and abdominal pain. Patient does have tenesmus and a history of mucous that was worked up by GI and found to have unknown etiology. Patient has been trying several over-the-counter medications such as senna, Metamucil, and citrate to help. Patient has been self disimpacting stool without hematochezia or gross blood. Patient has had no return of mucus, fevers/chills, nausea/vomiting/diarrhea before the constipation. Patient has not had a history of this in the past.   Past Medical History  Diagnosis Date  . Diabetic peripheral neuropathy   . Carpal tunnel syndrome, bilateral     confirmed on nerve conduction studies  . Hyperlipidemia   . Diabetes mellitus type 2, uncontrolled   . GERD (gastroesophageal reflux disease)   . Heat edema   . History of Helicobacter infection 11/07  . History of chest pain     negative cardiolite September 2007.  ETT-myoview 12/10 with EF 76%, no ischemia,   . History of endometriosis    Current Outpatient Prescriptions  Medication Sig Dispense Refill  . aspirin 81 MG EC tablet Take 81 mg by mouth daily.        Marland Kitchen atorvastatin (LIPITOR) 20 MG tablet Take 1 tablet (20 mg total) by mouth daily.  90 tablet  0  . clotrimazole (GYNE-LOTRIMIN) 1 % vaginal cream Apply to area that is itching once a day.  45 g  0  . cyclobenzaprine (FLEXERIL) 10 MG tablet Take 1 tablet (10 mg total) by mouth every 8 (eight) hours as needed for muscle spasms.  30 tablet  1  . esomeprazole (NEXIUM) 40 MG capsule Take 1 capsule (40 mg total) by mouth daily.  30 capsule  2  . glucose blood (TRUETRACK TEST) test strip  Use to test blood sugar 4 times a day before meals and bedtime  100 each  5  . hydrochlorothiazide (HYDRODIURIL) 25 MG tablet Take 1 tablet (25 mg total) by mouth daily.  30 tablet  1  . insulin aspart (NOVOLOG FLEXPEN) 100 UNIT/ML injection Inject 7 Units into the skin 3 (three) times daily before meals.  10 mL  3  . insulin glargine (LANTUS) 100 UNIT/ML injection Inject 47 Units into the skin every morning. When you wake up  30 mL  1  . Insulin Pen Needle (RELION MINI PEN NEEDLES) 31G X 6 MM MISC Use to take insulin injections 3 times a day before meals  100 each  5  . Lancets MISC Use to check blood sugar 3 times a day  100 each  5  . lisinopril (PRINIVIL,ZESTRIL) 20 MG tablet Take 1 tablet (20 mg total) by mouth daily.  30 tablet  1  . metFORMIN (GLUCOPHAGE) 1000 MG tablet Take 1 tablet (1,000 mg total) by mouth 2 (two) times daily with a meal.  60 tablet  1  . mineral oil enema Place 1 enema rectally once.  135 mL  3   Family History  Problem Relation Age of Onset  . Diabetes Mother   . Colon cancer Neg Hx   . Lung cancer Maternal Uncle   . Breast cancer Maternal Grandmother   . Liver cancer Maternal Grandfather  History   Social History  . Marital Status: Single    Spouse Name: N/A    Number of Children: 0  . Years of Education: N/A   Occupational History  . CNA    Social History Main Topics  . Smoking status: Never Smoker   . Smokeless tobacco: Never Used  . Alcohol Use: No  . Drug Use: No  . Sexually Active: None   Other Topics Concern  . None   Social History Narrative   Financial assistance approved for 100% discount at [facility] and has [facility] card per Chilon Boone10/21/2011   Review of Systems: Otherwise negative unless listed in history of present illness  Objective:  Physical Exam: Filed Vitals:   12/13/12 0917  BP: 158/92  Pulse: 68  Temp: 97.2 F (36.2 C)  TempSrc: Oral  Height: 4\' 11"  (1.499 m)  Weight: 173 lb 9.6 oz (78.744 kg)  SpO2: 99%    General: sitting in chair, well nourished, well developed HEENT: PERRL, EOMI, no scleral icterus Cardiac: RRR, no rubs, murmurs or gallops Pulm: clear to auscultation bilaterally, moving normal volumes of air Abd: soft, non-tender, distended, hypoactive BS Ext: warm and well perfused, no pedal edema Neuro: alert and oriented X3, cranial nerves II-XII grossly intact  Assessment & Plan:  1. Constipation: Pt has tried many OTC meds w/o success but is passing stool on occasion. Pt able to pass flatus. Concern for pt recently self disimpacting. Seems dietary changes maybe etiology as pt not taking opiates or other concerning medications. Pt did refuse rectal exam during visit. Pt doesn't have other warning signs of obstruction.  -mineral oil fleet enemas -pt educated that if unable to pass stool or flatus, develops vomiting, fever/chills, or hematochezia to return to be evaluated -pt refused flu shot today as was not feeling well  Pt was discussed with Dr. Stann Mainland

## 2012-12-14 ENCOUNTER — Telehealth: Payer: Self-pay | Admitting: *Deleted

## 2012-12-14 NOTE — Telephone Encounter (Signed)
MAP at [facility] calls and states that the previous script for novolog had instructions of 10 units, this script has 7 units, after they spoke to the pt she states she has continued to use 7units, so the new script is fine seeing pt has never started the 10 units.

## 2012-12-16 ENCOUNTER — Other Ambulatory Visit: Payer: Self-pay | Admitting: *Deleted

## 2012-12-16 DIAGNOSIS — E785 Hyperlipidemia, unspecified: Secondary | ICD-10-CM

## 2012-12-16 MED ORDER — ATORVASTATIN CALCIUM 20 MG PO TABS: 20.0000 mg | ORAL_TABLET | Freq: Every day | ORAL | Status: DC

## 2012-12-19 ENCOUNTER — Other Ambulatory Visit: Payer: Self-pay | Admitting: Internal Medicine

## 2012-12-19 DIAGNOSIS — Z1231 Encounter for screening mammogram for malignant neoplasm of breast: Secondary | ICD-10-CM

## 2012-12-19 NOTE — Telephone Encounter (Signed)
Rx faxed in.

## 2013-01-26 ENCOUNTER — Ambulatory Visit
Admission: RE | Admit: 2013-01-26 | Discharge: 2013-01-26 | Disposition: A | Discharge status: Home / Self Care | Payer: Self-pay | Source: Ambulatory Visit | Attending: Internal Medicine | Admitting: Internal Medicine

## 2013-01-26 DIAGNOSIS — Z1231 Encounter for screening mammogram for malignant neoplasm of breast: Secondary | ICD-10-CM

## 2013-01-30 ENCOUNTER — Encounter: Payer: Self-pay | Admitting: Physical Medicine & Rehabilitation

## 2013-01-30 ENCOUNTER — Encounter: Payer: No Typology Code available for payment source | Attending: Physical Medicine & Rehabilitation

## 2013-01-30 ENCOUNTER — Ambulatory Visit (HOSPITAL_BASED_OUTPATIENT_CLINIC_OR_DEPARTMENT_OTHER): Payer: No Typology Code available for payment source | Admitting: Physical Medicine & Rehabilitation

## 2013-01-30 VITALS — BP 139/82 | HR 84 | Resp 14 | Ht <= 58 in | Wt 174.2 lb

## 2013-01-30 DIAGNOSIS — G5601 Carpal tunnel syndrome, right upper limb: Secondary | ICD-10-CM

## 2013-01-30 DIAGNOSIS — G5600 Carpal tunnel syndrome, unspecified upper limb: Secondary | ICD-10-CM

## 2013-01-30 NOTE — Patient Instructions (Addendum)
Results will be sent to your primary care physician Discuss further treatment with your primary care physician

## 2013-01-30 NOTE — Progress Notes (Signed)
EMG performed 01/30/2013.  See EMG report under media tab.

## 2013-03-16 ENCOUNTER — Other Ambulatory Visit: Payer: Self-pay | Admitting: Internal Medicine

## 2013-03-16 DIAGNOSIS — I10 Essential (primary) hypertension: Secondary | ICD-10-CM

## 2013-03-17 ENCOUNTER — Other Ambulatory Visit: Payer: Self-pay | Admitting: *Deleted

## 2013-03-17 DIAGNOSIS — E119 Type 2 diabetes mellitus without complications: Secondary | ICD-10-CM

## 2013-03-17 MED ORDER — METFORMIN HCL 1000 MG PO TABS: 1000.0000 mg | ORAL_TABLET | Freq: Two times a day (BID) | ORAL | Status: DC

## 2013-03-23 ENCOUNTER — Ambulatory Visit (INDEPENDENT_AMBULATORY_CARE_PROVIDER_SITE_OTHER): Payer: No Typology Code available for payment source | Admitting: Radiation Oncology

## 2013-03-23 VITALS — BP 122/85 | HR 75 | Temp 98.7°F | Ht 59.0 in | Wt 174.7 lb

## 2013-03-23 DIAGNOSIS — E1149 Type 2 diabetes mellitus with other diabetic neurological complication: Secondary | ICD-10-CM

## 2013-03-23 DIAGNOSIS — G5600 Carpal tunnel syndrome, unspecified upper limb: Secondary | ICD-10-CM

## 2013-03-23 DIAGNOSIS — E785 Hyperlipidemia, unspecified: Secondary | ICD-10-CM

## 2013-03-23 DIAGNOSIS — I10 Essential (primary) hypertension: Secondary | ICD-10-CM

## 2013-03-23 DIAGNOSIS — G5601 Carpal tunnel syndrome, right upper limb: Secondary | ICD-10-CM

## 2013-03-23 DIAGNOSIS — E119 Type 2 diabetes mellitus without complications: Secondary | ICD-10-CM

## 2013-03-23 DIAGNOSIS — R109 Unspecified abdominal pain: Secondary | ICD-10-CM

## 2013-03-23 DIAGNOSIS — Z860100 Personal history of colon polyps, unspecified: Secondary | ICD-10-CM

## 2013-03-23 DIAGNOSIS — R1030 Lower abdominal pain, unspecified: Secondary | ICD-10-CM | POA: Insufficient documentation

## 2013-03-23 LAB — POCT GLYCOSYLATED HEMOGLOBIN (HGB A1C): Hemoglobin A1C: 10

## 2013-03-23 MED ORDER — METFORMIN HCL 1000 MG PO TABS: 1000.0000 mg | ORAL_TABLET | Freq: Two times a day (BID) | ORAL | Status: DC

## 2013-03-23 MED ORDER — ATORVASTATIN CALCIUM 20 MG PO TABS: 20.0000 mg | ORAL_TABLET | Freq: Every day | ORAL | Status: DC

## 2013-03-23 MED ORDER — INSULIN GLARGINE 100 UNIT/ML ~~LOC~~ SOLN: 52.0000 [IU] | SUBCUTANEOUS | Status: DC

## 2013-03-23 MED ORDER — HYDROCHLOROTHIAZIDE 25 MG PO TABS: 25.0000 mg | ORAL_TABLET | Freq: Every day | ORAL | Status: DC

## 2013-03-23 MED ORDER — LISINOPRIL 20 MG PO TABS: 20.0000 mg | ORAL_TABLET | Freq: Every day | ORAL | Status: DC

## 2013-03-23 NOTE — Assessment & Plan Note (Signed)
Lab Results  Component Value Date   HGBA1C 10.0 03/23/2013   HGBA1C 7.9 12/13/2012   HGBA1C 13.2 08/04/2012     Assessment: Diabetes control: poor control (HgbA1C >9%) Progress toward A1C goal:  deteriorated Comments: patient states she has been fully compliant with both her insulin as well as her metformin.   Plan: Medications:  Increase lantus to 52U daily (pt prefers QAM dosing). Continue metformin Home glucose monitoring: Frequency:  4x daily Timing:   Instruction/counseling given: reminded to bring blood glucose meter & log to each visit Educational resources provided:   Self management tools provided:   Other plans:

## 2013-03-23 NOTE — Assessment & Plan Note (Signed)
No current options available for neurosurgery or orthopedics referral given pt's lack of insurance. Will continue to work on referral when the [facility] is able to form a relationship with a surgeon that will see such pa andtients.

## 2013-03-23 NOTE — Assessment & Plan Note (Signed)
BP Readings from Last 3 Encounters:  03/23/13 122/85  01/30/13 139/82  12/13/12 158/92    Lab Results  Component Value Date   NA 139 02/29/2012   K 3.7 02/29/2012   CREATININE 0.84 02/29/2012    Assessment: Blood pressure control: controlled Progress toward BP goal:  at goal Comments:   Plan: Medications:  continue current medications Educational resources provided:   Self management tools provided:   Other plans:

## 2013-03-23 NOTE — Assessment & Plan Note (Signed)
Chronic. No alarm symptoms. Sigmoidoscopy in 08/2011 was unrevealing of any pathology. Patient's complaints of cramping abdominal pain associated with mucous containing stools and alternating diarrhea and constipation seen most consistent with irritable bowel syndrome. She states she has taken fiber in the past, however she has never taken it regularly.  - Psyllium fiber daily - consider referral back to GI if not improved at next visit

## 2013-03-23 NOTE — Assessment & Plan Note (Signed)
Pt will need f/u colonoscopy in 12/2013 given her finding of 1 adenomatous polyp on colonoscopy in 12/2008.

## 2013-03-23 NOTE — Progress Notes (Signed)
Subjective:    Patient ID: Nancy [name], female    DOB: 1956-08-30, 57 y.o.   MRN: [id number]  Hand Pain  Pertinent negatives include no chest pain.   Patient is a 57 year old woman with PMH as described below who presents to clinic today for followup of her carpal tunnel syndrome. The patient recently had EMG performed, which was consistent with carpal tunnel of the right wrist. She had previously been referred to orthopedic surgery at [facility], however she states that they will not operate on her at this time due to her lack of insurance, and she has no other options in [place] or surrounding areas for surgical intervention for the issue. She states she's used wrist splints both at night and during the day in the past, and these have been ineffective.  DM: She checks her CBGs irregularly at home, and does not have her meter with her today, however she states that her CBGs are typically between 150-200. She admits that her glycemic control has been worse over the last several months, which she ascribes to increased pain in association with her carpal tunnel syndrome. She adamantly maintains that she's been fully compliant with her insulin and metformin as prescribed.  Abdominal pain: Patient complains of chronic diffuse abdominal pain, with no change in character or severity recently. She complains of continued mucus-containing stools. She underwent colonoscopy in 2010 which was revealing only of 1 adenomatous polyp, for which she would need repeat colonoscopy in 2015. She underwent sigmoidoscopy in 2012 due to her continued complaints of mucus-containing stools, which was unrevealing of any abnormalities.  She has no other complaints today.    Review of Systems  Constitutional: Negative for fever and chills.  HENT: Negative.   Eyes: Negative.   Respiratory: Negative for cough and shortness of breath.   Cardiovascular: Negative for chest pain and leg swelling.  Gastrointestinal: Positive  for abdominal pain, diarrhea and constipation. Negative for nausea and vomiting.  Endocrine: Negative.   Genitourinary: Negative for dysuria and hematuria.  Musculoskeletal: Negative.   Skin: Negative.   Allergic/Immunologic: Negative.   Neurological: Negative.   Hematological: Negative.   Psychiatric/Behavioral: Negative.    Current Outpatient Medications: Current Outpatient Prescriptions  Medication Sig Dispense Refill  . aspirin 81 MG EC tablet Take 81 mg by mouth daily.        Marland Kitchen atorvastatin (LIPITOR) 20 MG tablet Take 1 tablet (20 mg total) by mouth daily.  30 tablet  5  . clotrimazole (GYNE-LOTRIMIN) 1 % vaginal cream Apply to area that is itching once a day.  45 g  0  . esomeprazole (NEXIUM) 40 MG capsule Take 1 capsule (40 mg total) by mouth daily.  30 capsule  2  . glucose blood (TRUETRACK TEST) test strip Use to test blood sugar 4 times a day before meals and bedtime  100 each  5  . hydrochlorothiazide (HYDRODIURIL) 25 MG tablet Take 1 tablet (25 mg total) by mouth daily.  30 tablet  0  . insulin glargine (LANTUS) 100 UNIT/ML injection Inject 0.52 mLs (52 Units total) into the skin every morning. When you wake up  30 mL  1  . Insulin Pen Needle (RELION MINI PEN NEEDLES) 31G X 6 MM MISC Use to take insulin injections 3 times a day before meals  100 each  5  . Lancets MISC Use to check blood sugar 3 times a day  100 each  5  . lisinopril (PRINIVIL,ZESTRIL) 20 MG tablet Take 1 tablet (20 mg  total) by mouth daily.  30 tablet  11  . metFORMIN (GLUCOPHAGE) 1000 MG tablet Take 1 tablet (1,000 mg total) by mouth 2 (two) times daily with a meal.  60 tablet  5   No current facility-administered medications for this visit.    Allergies: Allergies  Allergen Reactions  . Amitriptyline Hcl     REACTION: Upset stomach     Past Medical History: Past Medical History  Diagnosis Date  . Diabetic peripheral neuropathy   . Carpal tunnel syndrome, bilateral     confirmed on nerve  conduction studies  . Hyperlipidemia   . Diabetes mellitus type 2, uncontrolled   . GERD (gastroesophageal reflux disease)   . Heat edema   . History of Helicobacter infection 11/07  . History of chest pain     negative cardiolite September 2007.  ETT-myoview 12/10 with EF 76%, no ischemia,   . History of endometriosis     Past Surgical History: Past Surgical History  Procedure Laterality Date  . Abdominal hysterectomy      Family History: Family History  Problem Relation Age of Onset  . Diabetes Mother   . Colon cancer Neg Hx   . Lung cancer Maternal Uncle   . Breast cancer Maternal Grandmother   . Liver cancer Maternal Grandfather     Social History: History   Social History  . Marital Status: Single    Spouse Name: N/A    Number of Children: 0  . Years of Education: N/A   Occupational History  . CNA    Social History Main Topics  . Smoking status: Never Smoker   . Smokeless tobacco: Never Used  . Alcohol Use: No  . Drug Use: No  . Sexually Active: Not on file   Other Topics Concern  . Not on file   Social History Narrative   Financial assistance approved for 100% discount at [facility] and has [facility] card per [employer]   09/26/2010     Vital Signs: Blood pressure 122/85, pulse 75, temperature 98.7 F (37.1 C), temperature source Oral, height 4\' 11"  (1.499 m), weight 174 lb 11.2 oz (79.243 kg), last menstrual period 02/15/2009, SpO2 99.00%.      Objective:   Physical Exam  Constitutional: She is oriented to person, place, and time. She appears well-developed and well-nourished. No distress.  HENT:  Head: Normocephalic and atraumatic.  Eyes: Conjunctivae are normal. Pupils are equal, round, and reactive to light. No scleral icterus.  Neck: Normal range of motion. Neck supple. No tracheal deviation present.  Cardiovascular: Normal rate and regular rhythm.   No murmur heard. Pulmonary/Chest: Effort normal. She has no wheezes. She has no rales.   Abdominal: Soft. Bowel sounds are normal. She exhibits no distension. There is tenderness (Mild generalized tenderness. ). There is no rebound and no guarding.  Musculoskeletal: Normal range of motion. She exhibits no edema.  Neurological: She is alert and oriented to person, place, and time. No cranial nerve deficit.  Skin: Skin is warm and dry. No erythema.  Psychiatric: She has a normal mood and affect. Her behavior is normal.          Assessment & Plan:

## 2013-03-23 NOTE — Patient Instructions (Addendum)
Increase your lantus from 47U to 52U every morning. Please take fiber daily as we discussed.   It was very nice to meet you. Have a great day.

## 2013-03-28 NOTE — Progress Notes (Signed)
Case discussed with Dr. Para Skeans at the time of the visit. We reviewed the resident's history and exam and pertinent patient test results. I agree with the assessment, diagnosis and plan of care documented in the resident's note.  As surgical options are not currently available to address Nancy [name]'s right carpal tunnel syndrome, and she did not respond to bracing, we will aggressively treat her diabetes as there is some association between diabetes and carpal tunnel syndrome.  It is hoped this can help with some of her symptoms.

## 2013-04-18 ENCOUNTER — Ambulatory Visit (INDEPENDENT_AMBULATORY_CARE_PROVIDER_SITE_OTHER): Payer: No Typology Code available for payment source | Admitting: Internal Medicine

## 2013-04-18 ENCOUNTER — Encounter: Payer: Self-pay | Admitting: Dietician

## 2013-04-18 VITALS — BP 116/78 | HR 86 | Temp 98.1°F | Resp 20 | Ht 58.5 in | Wt 174.6 lb

## 2013-04-18 DIAGNOSIS — G5603 Carpal tunnel syndrome, bilateral upper limbs: Secondary | ICD-10-CM

## 2013-04-18 DIAGNOSIS — G5600 Carpal tunnel syndrome, unspecified upper limb: Secondary | ICD-10-CM

## 2013-04-18 DIAGNOSIS — E119 Type 2 diabetes mellitus without complications: Secondary | ICD-10-CM

## 2013-04-18 DIAGNOSIS — I10 Essential (primary) hypertension: Secondary | ICD-10-CM

## 2013-04-18 LAB — GLUCOSE, CAPILLARY: Glucose-Capillary: 432 mg/dL — ABNORMAL HIGH (ref 70–99)

## 2013-04-18 LAB — HM DIABETES EYE EXAM

## 2013-04-18 NOTE — Assessment & Plan Note (Signed)
Patient has uncontrolled diabetes due to noncompliance. I discussed in length about the importance of taking her medication on a daily basis. I discussed also about exercise and diet changes. At this point I advised her to continue metformin thousand milligrams twice a day along with Lantus 52 units daily at bedtime.

## 2013-04-18 NOTE — Assessment & Plan Note (Signed)
Blood pressure is well controlled on current regimen. Will continue hydrochlorothiazide 25 mg daily along with visit to 20 mg daily. BP Readings from Last 3 Encounters:  04/18/13 116/78  03/23/13 122/85  01/30/13 139/82

## 2013-04-18 NOTE — Progress Notes (Signed)
Subjective:   Patient ID: Nancy [name] female   DOB: 1956-06-30 57 y.o.   MRN: [id number]  HPI: [name] is a 57 y.o. female with past medical history significant as outlined below who presented to the clinic for regular followup. Patient was noted to have uncontrolled diabetes and routine regular intake of Lantus. She takes her Lantus whenever she things she will eat that day. She does take her metformin thousand milligrams twice a day. She also does not check her blood sugars because it makes her really upset if she sees high blood sugars.   she further complains about bilateral hand pain which is chronic. Unfortunately with an orange card we can not refer patient to any hand surgeon in [place]. She has tried to see somebody at [facility] but over there  they also were not able to accommodate her.    Past Medical History  Diagnosis Date  . Diabetic peripheral neuropathy   . Carpal tunnel syndrome, bilateral     confirmed on nerve conduction studies  . Hyperlipidemia   . Diabetes mellitus type 2, uncontrolled   . GERD (gastroesophageal reflux disease)   . Heat edema   . History of Helicobacter infection 11/07  . History of chest pain     negative cardiolite September 2007.  ETT-myoview 12/10 with EF 76%, no ischemia,   . History of endometriosis    Current Outpatient Prescriptions  Medication Sig Dispense Refill  . aspirin 81 MG EC tablet Take 81 mg by mouth daily.        Marland Kitchen atorvastatin (LIPITOR) 20 MG tablet Take 1 tablet (20 mg total) by mouth daily.  30 tablet  5  . esomeprazole (NEXIUM) 40 MG capsule Take 1 capsule (40 mg total) by mouth daily.  30 capsule  2  . glucose blood (TRUETRACK TEST) test strip Use to test blood sugar 4 times a day before meals and bedtime  100 each  5  . hydrochlorothiazide (HYDRODIURIL) 25 MG tablet Take 1 tablet (25 mg total) by mouth daily.  30 tablet  0  . insulin glargine (LANTUS) 100 UNIT/ML injection Inject 0.52 mLs (52 Units total) into  the skin every morning. When you wake up  30 mL  1  . Insulin Pen Needle (RELION MINI PEN NEEDLES) 31G X 6 MM MISC Use to take insulin injections 3 times a day before meals  100 each  5  . Lancets MISC Use to check blood sugar 3 times a day  100 each  5  . lisinopril (PRINIVIL,ZESTRIL) 20 MG tablet Take 1 tablet (20 mg total) by mouth daily.  30 tablet  11  . metFORMIN (GLUCOPHAGE) 1000 MG tablet Take 1 tablet (1,000 mg total) by mouth 2 (two) times daily with a meal.  60 tablet  5   No current facility-administered medications for this visit.   Family History  Problem Relation Age of Onset  . Diabetes Mother   . Colon cancer Neg Hx   . Lung cancer Maternal Uncle   . Breast cancer Maternal Grandmother   . Liver cancer Maternal Grandfather    History   Social History  . Marital Status: Single    Spouse Name: N/A    Number of Children: 0  . Years of Education: 11   Occupational History  . CNA    Social History Main Topics  . Smoking status: Never Smoker   . Smokeless tobacco: Never Used  . Alcohol Use: No  . Drug Use: No  .  Sexually Active: Not on file   Other Topics Concern  . Not on file   Social History Narrative   Financial assistance approved for 100% discount at [facility] and has [facility] card per [employer]   09/26/2010   Review of Systems: Constitutional: Denies fever, chills, diaphoresis, appetite change and fatigue.    Respiratory: Denies SOB, DOE, cough, chest tightness,  and wheezing.   Cardiovascular: Denies chest pain, palpitations and leg swelling.  Gastrointestinal: Denies nausea, vomiting, abdominal pain, diarrhea, constipation, blood in stool and abdominal distention.  Genitourinary: Denies dysuria, urgency, frequency, hematuria, flank pain and difficulty urinating.  Skin: Denies pallor, rash and wound.  Neurological: Denies dizziness,  headaches.    Objective:  Physical Exam: Filed Vitals:   04/18/13 1515  BP: 116/78  Pulse: 86  Temp: 98.1 F (36.7  C)  TempSrc: Oral  Resp: 20  Height: 4' 10.5" (1.486 m)  Weight: 174 lb 9.6 oz (79.198 kg)  SpO2: 98%   Constitutional: Vital signs reviewed.  Patient is a well-developed and well-nourished female  in no acute distress and cooperative with exam. Alert and oriented x3.  Eyes: PERRL, EOMI, conjunctivae normal, No scleral icterus.  Neck: Supple,  Cardiovascular: RRR, S1 normal, S2 normal, no MRG, pulses symmetric and intact bilaterally Pulmonary/Chest: CTAB, no wheezes, rales, or rhonchi Abdominal: Soft. Non-tender, non-distended, bowel sounds are normal, Hematology: no cervical adenopathy.  Neurological: A&O x3, Strength is normal and symmetric bilaterally,

## 2013-04-19 NOTE — Progress Notes (Signed)
Case discussed with Dr. Newt Lukes immediately after the resident saw the patient.  We reviewed the resident's history and exam and pertinent patient test results.  I agree with the assessment, diagnosis and plan of care documented in the resident's note.

## 2013-04-25 ENCOUNTER — Other Ambulatory Visit (INDEPENDENT_AMBULATORY_CARE_PROVIDER_SITE_OTHER): Payer: Self-pay

## 2013-04-25 DIAGNOSIS — E119 Type 2 diabetes mellitus without complications: Secondary | ICD-10-CM

## 2013-04-25 DIAGNOSIS — I10 Essential (primary) hypertension: Secondary | ICD-10-CM

## 2013-04-25 LAB — BASIC METABOLIC PANEL WITH GFR
CO2: 26 mEq/L (ref 19–32)
Calcium: 9.4 mg/dL (ref 8.4–10.5)
Creat: 0.97 mg/dL (ref 0.50–1.10)
GFR, Est Non African American: 66 mL/min

## 2013-04-26 ENCOUNTER — Encounter: Payer: Self-pay | Admitting: Internal Medicine

## 2013-05-04 NOTE — Addendum Note (Signed)
Addended by: Orson Gear on: 05/04/2013 03:51 PM   Modules accepted: Orders

## 2013-05-04 NOTE — Addendum Note (Signed)
Addended by: Orson Gear on: 05/04/2013 03:52 PM   Modules accepted: Orders

## 2013-05-09 ENCOUNTER — Encounter: Payer: Self-pay | Admitting: Internal Medicine

## 2013-05-09 ENCOUNTER — Ambulatory Visit (INDEPENDENT_AMBULATORY_CARE_PROVIDER_SITE_OTHER): Payer: No Typology Code available for payment source | Admitting: Internal Medicine

## 2013-05-09 VITALS — BP 159/82 | HR 63 | Temp 97.6°F | Ht 58.5 in | Wt 179.8 lb

## 2013-05-09 DIAGNOSIS — Z111 Encounter for screening for respiratory tuberculosis: Secondary | ICD-10-CM

## 2013-05-09 DIAGNOSIS — R109 Unspecified abdominal pain: Secondary | ICD-10-CM

## 2013-05-09 DIAGNOSIS — M62838 Other muscle spasm: Secondary | ICD-10-CM

## 2013-05-09 DIAGNOSIS — Z860100 Personal history of colon polyps, unspecified: Secondary | ICD-10-CM

## 2013-05-09 DIAGNOSIS — E1149 Type 2 diabetes mellitus with other diabetic neurological complication: Secondary | ICD-10-CM

## 2013-05-09 DIAGNOSIS — E785 Hyperlipidemia, unspecified: Secondary | ICD-10-CM

## 2013-05-09 DIAGNOSIS — E119 Type 2 diabetes mellitus without complications: Secondary | ICD-10-CM

## 2013-05-09 DIAGNOSIS — I10 Essential (primary) hypertension: Secondary | ICD-10-CM

## 2013-05-09 DIAGNOSIS — G5601 Carpal tunnel syndrome, right upper limb: Secondary | ICD-10-CM

## 2013-05-09 DIAGNOSIS — G5600 Carpal tunnel syndrome, unspecified upper limb: Secondary | ICD-10-CM

## 2013-05-09 LAB — GLUCOSE, CAPILLARY: Glucose-Capillary: 107 mg/dL — ABNORMAL HIGH (ref 70–99)

## 2013-05-09 MED ORDER — GABAPENTIN 300 MG PO CAPS: 300.0000 mg | ORAL_CAPSULE | Freq: Three times a day (TID) | ORAL | Status: DC

## 2013-05-09 MED ORDER — INSULIN GLARGINE 100 UNIT/ML ~~LOC~~ SOLN: 58.0000 [IU] | SUBCUTANEOUS | Status: DC

## 2013-05-09 NOTE — Patient Instructions (Signed)
General Instructions: For your diabetes, we are increasing your Lantus insulin to 58 units daily. -be sure to bring your glucometer to your next visit  For your vision, we are referring you to Ophthalmology.  There is a waiting list for these appointments, so this may take some time.  For your neuropathy, we are starting Gabapentin.  Take 1 tablet, three times per day.  You may experience some drowsiness with this medication.  For your calf cramping, we recommend undergoing Ankle Brachial Index (ABI) testing.  Please return for a follow-up visit in 2-3 months.   Treatment Goals:  Goals (1 Years of Data) as of 05/09/13         As of Today 04/18/13 03/23/13 01/30/13 12/13/12     Blood Pressure    . Blood Pressure < 130/80  159/82 116/78 122/85 139/82 158/92     Result Component    . HEMOGLOBIN A1C < 7.0    10.0  7.9    . LDL CALC < 100            Progress Toward Treatment Goals:  Treatment Goal 05/09/2013  Hemoglobin A1C improved  Blood pressure deteriorated    Self Care Goals & Plans:  Self Care Goal 05/09/2013  Manage my medications take my medicines as prescribed; bring my medications to every visit; refill my medications on time  Monitor my health keep track of my blood glucose; bring my glucose meter and log to each visit  Eat healthy foods drink diet soda or water instead of juice or soda; eat fruit for snacks and desserts  Be physically active take a walk every day    Home Blood Glucose Monitoring 05/09/2013  Check my blood sugar once a day  When to check my blood sugar before meals     Care Management & Community Referrals:  Referral 05/09/2013  Referrals made for care management support none needed

## 2013-05-09 NOTE — Assessment & Plan Note (Signed)
A TB test was placed today.  The patient will return to have this read on 6/5.

## 2013-05-09 NOTE — Progress Notes (Signed)
HPI The patient is a 57 y.o. female with a history of DM, HTN, HL, presenting for a follow-up visit.  The patient has a history of DM.  Last A1C was 10 (03/23/13).  At the patient's last office visit 3 weeks ago, the patient was not taking Lantus regularly.  The patient's CBG was 107 today.  The patient did not bring her glucometer today, but notes that her blood sugars have been in the high 100's to 200's.  The patient reports full compliance with Lantus and metformin every single day.  The patient notes no episodes of hypoglycemia since her last visit.  Her highest value has been in the 500's, asymptomatic.  The patient notes poor vision.  She recently had an evaluation with our retinal scanner, which showed no retinopathy, but has not had an evaluation for eye glasses in several years.  The patient notes continued muscle spasms in her calves, which has been occurring off and on for months-years.  This typically occurs after walking long distances, not relieved by rest.  This can also occur when sitting at home, somewhat improved by standing up.  The patient notes mild occasional low back pain. She notes no saddle anesthesia, or change in bowel/bladder habits.  The patient also requests a TB test for work.  The patient has a history of HTN, but she has been out of her BP meds for 3 days.  BP was controlled at her last visit, and no changes have been made since then.  BP is elevated today.  ROS: General: no fevers, chills, changes in weight, changes in appetite Skin: no rash HEENT: no blurry vision, hearing changes, sore throat Pulm: no dyspnea, coughing, wheezing CV: no chest pain, palpitations, shortness of breath Abd: no abdominal pain, nausea/vomiting, diarrhea/constipation GU: no dysuria, hematuria, polyuria Ext: see HPI Neuro: see HPI  Filed Vitals:   05/09/13 1024  BP: 159/82  Pulse: 63  Temp: 97.6 F (36.4 C)    PEX General: alert, cooperative, and in no apparent  distress HEENT: pupils equal round and reactive to light, vision grossly intact, oropharynx clear and non-erythematous  Neck: supple, no lymphadenopathy Lungs: clear to ascultation bilaterally, normal work of respiration, no wheezes, rales, ronchi Heart: regular rate and rhythm, no murmurs, gallops, or rubs Abdomen: soft, non-tender, non-distended, normal bowel sounds Extremities: no cyanosis, clubbing, or edema Neurologic: alert & oriented X3, cranial nerves II-XII intact, strength grossly intact, sensation intact to light touch  Current Outpatient Prescriptions on File Prior to Visit  Medication Sig Dispense Refill  . aspirin 81 MG EC tablet Take 81 mg by mouth daily.        Marland Kitchen atorvastatin (LIPITOR) 20 MG tablet Take 1 tablet (20 mg total) by mouth daily.  30 tablet  5  . esomeprazole (NEXIUM) 40 MG capsule Take 1 capsule (40 mg total) by mouth daily.  30 capsule  2  . glucose blood (TRUETRACK TEST) test strip Use to test blood sugar 4 times a day before meals and bedtime  100 each  5  . hydrochlorothiazide (HYDRODIURIL) 25 MG tablet Take 1 tablet (25 mg total) by mouth daily.  30 tablet  0  . insulin glargine (LANTUS) 100 UNIT/ML injection Inject 0.52 mLs (52 Units total) into the skin every morning. When you wake up  30 mL  1  . Insulin Pen Needle (RELION MINI PEN NEEDLES) 31G X 6 MM MISC Use to take insulin injections 3 times a day before meals  100 each  5  . Lancets MISC Use to check blood sugar 3 times a day  100 each  5  . lisinopril (PRINIVIL,ZESTRIL) 20 MG tablet Take 1 tablet (20 mg total) by mouth daily.  30 tablet  11  . metFORMIN (GLUCOPHAGE) 1000 MG tablet Take 1 tablet (1,000 mg total) by mouth 2 (two) times daily with a meal.  60 tablet  5   No current facility-administered medications on file prior to visit.    Assessment/Plan

## 2013-05-09 NOTE — Assessment & Plan Note (Signed)
The patient has a history of calf cramping, which can occur both with exertion or at rest, and is not relieved by rest.  She has vascular risk factors of diabetes, hypertension, and hyperlipidemia.  The etiology of her symptoms isn't entirely clear.  After discussing with Dr. Daryll Drown, we will proceed with ABI. -referral sent for ABI

## 2013-05-09 NOTE — Assessment & Plan Note (Addendum)
Lab Results  Component Value Date   HGBA1C 10.0 03/23/2013   HGBA1C 7.9 12/13/2012   HGBA1C 13.2 08/04/2012     Assessment: Diabetes control: poor control (HgbA1C >9%) Progress toward A1C goal:  improved Comments: The patient did not bring her glucometer, but notes improved blood sugars since her last visit, with full compliance with lantus and metformin, and no episodes of hypoglycemia.  As such, we'll increase her insulin by about 10%.  Plan: Medications:  Increase Lantus from 52 to 58 units daily.  Continue metformin. Home glucose monitoring: Frequency: once a day Timing: before meals Instruction/counseling given: reminded to bring blood glucose meter & log to each visit Educational resources provided:   Self management tools provided:   Other plans: Will refer to Ophtho for a new glasses prescription.  Patient has already had a retinal camera exam with no retinopathy.  Also, we will start gabapentin for diabetic neuropathy.

## 2013-05-09 NOTE — Assessment & Plan Note (Signed)
BP Readings from Last 3 Encounters:  05/09/13 159/82  04/18/13 116/78  03/23/13 122/85    Lab Results  Component Value Date   NA 137 04/25/2013   K 4.2 04/25/2013   CREATININE 0.97 04/25/2013    Assessment: Blood pressure control: moderately elevated Progress toward BP goal:  deteriorated Comments: The patient's BP is elevated, due to reported non-compliance.  BP was normal at the patient's last visit on her current regimen.  Plan: Medications:  continue current medications Educational resources provided: brochure Self management tools provided: home blood pressure logbook Other plans: Recheck BP at next visit

## 2013-05-10 NOTE — Progress Notes (Signed)
Case discussed with Dr. Owens Shark immediately after the resident saw the patient. We reviewed the resident's history and exam and pertinent patient test results. I agree with the assessment, diagnosis and plan of care documented in the resident's note.  Due to the unclear nature/progress of her LE pain, will rule out vascular disease with an ABI (resting only at this time).  Will also ask her to keep track of her symptoms to better delineate when they are worse, better, etc.

## 2013-05-11 LAB — TB SKIN TEST: TB Skin Test: NEGATIVE

## 2013-05-16 ENCOUNTER — Ambulatory Visit (HOSPITAL_COMMUNITY)
Admission: RE | Admit: 2013-05-16 | Discharge: 2013-05-16 | Disposition: A | Discharge status: Home / Self Care | Payer: No Typology Code available for payment source | Source: Ambulatory Visit | Attending: Internal Medicine | Admitting: Internal Medicine

## 2013-05-16 ENCOUNTER — Ambulatory Visit: Payer: No Typology Code available for payment source

## 2013-05-16 DIAGNOSIS — M62838 Other muscle spasm: Secondary | ICD-10-CM

## 2013-05-16 DIAGNOSIS — M79609 Pain in unspecified limb: Secondary | ICD-10-CM | POA: Insufficient documentation

## 2013-05-16 NOTE — Progress Notes (Signed)
VASCULAR LAB PRELIMINARY  ARTERIAL  ABI completed:  ABIs and pedal waveforms normal.    RIGHT    LEFT    PRESSURE WAVEFORM  PRESSURE WAVEFORM  BRACHIAL 161 Triphasic  BRACHIAL 158 Triphasic   DP 158 Triphasic  DP 185 Triphasic   AT   AT    PT 175 Triphasic  PT 180 Triphasic   PER   PER    GREAT TOE  NA GREAT TOE  NA    RIGHT LEFT  ABI 1.09 1.15     Nancy [name], RVT 05/16/2013, 9:24 AM

## 2013-06-13 ENCOUNTER — Telehealth: Payer: Self-pay | Admitting: *Deleted

## 2013-06-13 ENCOUNTER — Other Ambulatory Visit: Payer: Self-pay | Admitting: *Deleted

## 2013-06-13 DIAGNOSIS — G5601 Carpal tunnel syndrome, right upper limb: Secondary | ICD-10-CM

## 2013-06-13 DIAGNOSIS — I10 Essential (primary) hypertension: Secondary | ICD-10-CM

## 2013-06-13 DIAGNOSIS — E785 Hyperlipidemia, unspecified: Secondary | ICD-10-CM

## 2013-06-13 DIAGNOSIS — Z86010 Personal history of colonic polyps: Secondary | ICD-10-CM

## 2013-06-13 DIAGNOSIS — E1149 Type 2 diabetes mellitus with other diabetic neurological complication: Secondary | ICD-10-CM

## 2013-06-13 DIAGNOSIS — R109 Unspecified abdominal pain: Secondary | ICD-10-CM

## 2013-06-13 DIAGNOSIS — E119 Type 2 diabetes mellitus without complications: Secondary | ICD-10-CM

## 2013-06-13 MED ORDER — INSULIN GLARGINE 100 UNIT/ML ~~LOC~~ SOLN: 58.0000 [IU] | SUBCUTANEOUS | Status: DC

## 2013-06-13 NOTE — Telephone Encounter (Signed)
Pt called me. Had been seen by MD and requested refills but didn't get a refill of insulin. She is running out. Please give her a call when this is complete, Thanks

## 2013-06-13 NOTE — Telephone Encounter (Signed)
The refill had been waiting since mon 7/7 at the [facility] for pick up

## 2013-06-14 NOTE — Telephone Encounter (Signed)
Called to pharm 

## 2013-06-20 ENCOUNTER — Telehealth: Payer: Self-pay | Admitting: Dietician

## 2013-06-20 DIAGNOSIS — E119 Type 2 diabetes mellitus without complications: Secondary | ICD-10-CM

## 2013-06-20 MED ORDER — BLOOD GLUCOSE METER KIT: PACK | Status: DC

## 2013-06-20 MED ORDER — GLUCOSE BLOOD VI STRP: ORAL_STRIP | Status: DC

## 2013-06-20 NOTE — Telephone Encounter (Signed)
Done. thanks

## 2013-06-20 NOTE — Telephone Encounter (Signed)
Meter and strips have been called in, please sign order

## 2013-06-27 ENCOUNTER — Encounter: Payer: Self-pay | Admitting: Internal Medicine

## 2013-06-27 ENCOUNTER — Ambulatory Visit (INDEPENDENT_AMBULATORY_CARE_PROVIDER_SITE_OTHER): Payer: No Typology Code available for payment source | Admitting: Internal Medicine

## 2013-06-27 VITALS — BP 140/92 | HR 79 | Temp 98.4°F | Ht 58.5 in | Wt 179.1 lb

## 2013-06-27 DIAGNOSIS — G5600 Carpal tunnel syndrome, unspecified upper limb: Secondary | ICD-10-CM

## 2013-06-27 DIAGNOSIS — M62838 Other muscle spasm: Secondary | ICD-10-CM

## 2013-06-27 DIAGNOSIS — I10 Essential (primary) hypertension: Secondary | ICD-10-CM

## 2013-06-27 DIAGNOSIS — E119 Type 2 diabetes mellitus without complications: Secondary | ICD-10-CM

## 2013-06-27 DIAGNOSIS — G5603 Carpal tunnel syndrome, bilateral upper limbs: Secondary | ICD-10-CM

## 2013-06-27 LAB — GLUCOSE, CAPILLARY: Glucose-Capillary: 175 mg/dL — ABNORMAL HIGH (ref 70–99)

## 2013-06-27 MED ORDER — GABAPENTIN 300 MG PO CAPS: 300.0000 mg | ORAL_CAPSULE | Freq: Three times a day (TID) | ORAL | Status: DC

## 2013-06-27 NOTE — Assessment & Plan Note (Signed)
Lab Results  Component Value Date   HGBA1C 9.4 06/27/2013   HGBA1C 10.0 03/23/2013   HGBA1C 7.9 12/13/2012   Assessment: Diabetes control: poor control (HgbA1C >9%) Progress toward A1C goal:  improved Comments: slightly improved, adherent to lantus and metformin, but poor diet and insight and does not regularly check sugars and does not bring meter to visit, says current one does not work right. Denies hypoglycemia but does endorse cbgs as high as 500s.  Does not always eat in the morning.   Plan: Medications:  continue current medications Home glucose monitoring: Frequency: 3 times a day Timing: before meals Instruction/counseling given: reminded to get eye exam, reminded to bring blood glucose meter & log to each visit, reminded to bring medications to each visit, discussed the need for weight loss and discussed diet Educational resources provided: brochure Self management tools provided: home glucose logbook Other plans: meet with CDE as soon as possible, get meter that is waiting for her at health department, check sugars three times a day, work on diet and less carbs more exercise as tolerated. Will need extensive education, counseling and follow up

## 2013-06-27 NOTE — Progress Notes (Signed)
Subjective:   Patient ID: Nancy [name] female   DOB: 1956-11-04 57 y.o.   MRN: [id number]  HPI: [name] is a 57 y.o. female with PMH DM2, HTN, HL, presenting to [facility] today for a follow-up visit alongside her mother.  She continues to complain of worsening b/l wrist pain that is severely limiting her ability to work.  She works as a Social research officer, government and has limited hours due to her pain and therefore, has very limited income.  She claims she does not qualify for medicaid or other assistance and due to the [employer] card she cannot get surgery for her carpal tunnel.  She claims she did go to [facility] and they just looked at her but did not pursue surgery, possibly because she couldn't not afford it.  Of note, there is no documentation of such a visit.  She also reports getting injections in the past by sports medicine but has not gone in at least 1 year she claims because she felt like it didn't help.  She has not been taking her gabapentin due to affordability and has not been using her night splints regularly as she says it gets uncomfortable so she takes it off.  She is very frustrated over her situation and also endorses hyperglycemia.  She says all her doctors always tell her to control her sugars but she is more concerned with her hands and says she knows her diabetes is not the main reason for her hands the way they are and she needs it to get better to be able to work.  She does not wish to be on more medication.    EMG from 02/2011: noted to have right medial neuropathy at wrist  In regards to her diabetes, she remains uncontrolled, endorses frequent hyperglycemia with occasional cbgs in 500s, denies hypoglycemia.  She continues to not bring in her meter to clinic and claims the one she has that was given to her by a friend doesn't work. But then she says she checks her sugars sometimes and it reads and then sometimes it says error, so it is hard to gauge exactly what is happening.  What is  clear, is that her weight continues to rise, she is adherent to her new lantus regimen of 58 units and metformin, but continues to follow a high carb diet and not following diabetic management, diet, and control.  She appears to have poor insight in to her disease and ?denial in to how bad her diabetes can be contributing to her pain.  She is however thankfully willing to meet with our diabetes educator again and I have discussed the likelihood of needing to start her on short acting meal time coverage at this time.  She will likely benefit from this but needs extensive education prior to starting and needs to improve her diet and portions.  She admits to not always eating breakfast and skipping meals but then having heavier meals and high carb and lots of fruit as snacks.  [name] also met with her briefly in the room today and her meter is available to be picked up the health department and when she can afford it she will hopefully pick it up soon.    Past Medical History  Diagnosis Date  . Diabetic peripheral neuropathy   . Carpal tunnel syndrome, bilateral     confirmed on nerve conduction studies  . Hyperlipidemia   . Diabetes mellitus type 2, uncontrolled   . GERD (gastroesophageal reflux disease)   .  Heat edema   . History of Helicobacter infection 11/07  . History of chest pain     negative cardiolite September 2007.  ETT-myoview 12/10 with EF 76%, no ischemia,   . History of endometriosis    Current Outpatient Prescriptions  Medication Sig Dispense Refill  . aspirin 81 MG EC tablet Take 81 mg by mouth daily.        Marland Kitchen atorvastatin (LIPITOR) 20 MG tablet Take 1 tablet (20 mg total) by mouth daily.  30 tablet  5  . Blood Glucose Monitoring Suppl (BLOOD GLUCOSE METER) kit Use to check blood sugar up to 3 times a day DX code 250.02 insulin requiring  1 each  0  . esomeprazole (NEXIUM) 40 MG capsule Take 1 capsule (40 mg total) by mouth daily.  30 capsule  2  . gabapentin (NEURONTIN)  300 MG capsule Take 1 capsule (300 mg total) by mouth 3 (three) times daily.  90 capsule  2  . glucose blood (WAVESENSE PRESTO) test strip Use to check blood sugar up to 3 times a day DX code 250.02 insulin requiring  100 each  12  . hydrochlorothiazide (HYDRODIURIL) 25 MG tablet Take 1 tablet (25 mg total) by mouth daily.  30 tablet  0  . insulin glargine (LANTUS) 100 UNIT/ML injection Inject 0.58 mLs (58 Units total) into the skin every morning. When you wake up  30 mL  3  . Insulin Pen Needle (RELION MINI PEN NEEDLES) 31G X 6 MM MISC Use to take insulin injections 3 times a day before meals  100 each  5  . Lancets MISC Use to check blood sugar 3 times a day  100 each  5  . lisinopril (PRINIVIL,ZESTRIL) 20 MG tablet Take 1 tablet (20 mg total) by mouth daily.  30 tablet  11  . metFORMIN (GLUCOPHAGE) 1000 MG tablet Take 1 tablet (1,000 mg total) by mouth 2 (two) times daily with a meal.  60 tablet  5   No current facility-administered medications for this visit.   Family History  Problem Relation Age of Onset  . Diabetes Mother   . Colon cancer Neg Hx   . Lung cancer Maternal Uncle   . Breast cancer Maternal Grandmother   . Liver cancer Maternal Grandfather    History   Social History  . Marital Status: Single    Spouse Name: N/A    Number of Children: 0  . Years of Education: 11   Occupational History  . CNA    Social History Main Topics  . Smoking status: Never Smoker   . Smokeless tobacco: Never Used  . Alcohol Use: No  . Drug Use: No  . Sexually Active: Not on file   Other Topics Concern  . Not on file   Social History Narrative   Financial assistance approved for 100% discount at [facility] and has [facility] card per [employer]   09/26/2010   Review of Systems:  Constitutional:  Denies fever, chills, diaphoresis, appetite change and fatigue.   HEENT:  Denies congestion, sore throat, rhinorrhea, sneezing, mouth sores, trouble swallowing, neck pain   Respiratory:  Denies  SOB, DOE, cough, and wheezing.   Cardiovascular:  Denies chest pain, palpitations, and leg swelling.   Gastrointestinal:  Obesity.  Denies nausea, vomiting, abdominal pain, diarrhea, constipation, blood in stool and abdominal distention.   Genitourinary:  Denies dysuria, urgency, frequency, hematuria, flank pain and difficulty urinating.   Musculoskeletal:  B/l wrist pain.  Denies myalgias, back  pain, joint swelling, arthralgias and gait problem.   Skin:  Denies pallor, rash and wound.   Neurological:  Denies dizziness, seizures, syncope, weakness, light-headedness, numbness and headaches.    Objective:  Physical Exam: Filed Vitals:   06/27/13 0838  BP: 140/92  Pulse: 79  Temp: 98.4 F (36.9 C)  TempSrc: Oral  Height: 4' 10.5" (1.486 m)  Weight: 179 lb 1.6 oz (81.239 kg)  SpO2: 97%   Vitals reviewed. General: sitting in chair, NAD HEENT: PERRL, EOMI, no scleral icterus Cardiac: RRR, no rubs, murmurs or gallops Pulm: clear to auscultation bilaterally, no wheezes, rales, or rhonchi Abd: soft, obese, nontender, nondistended, BS present Ext: warm and well perfused, no pedal edema, +2DP B/L, +tinel and phalens, pain on movement of 4th left finger, unable to make full fist due to pain especially in left hand Neuro: alert and oriented X3, cranial nerves II-XII grossly intact, 3-4/5 b/l upper extremities 5/5 lower extremities and sensation to light touch equal in bilateral upper and lower extremities.  Assessment & Plan:  Discussed with Dr. Daryll Drown  Chronic wrist pain in setting of uncontrolled DM2--needs to meet with CDE asap first and touch base with sports medicine again in the mean time.

## 2013-06-27 NOTE — Patient Instructions (Addendum)
General Instructions: PLEASE GO GET A METER RIGHT AWAY AND START REGULARLY CHECKING YOUR BLOOD SUGARS AT LEAST THREE TIMES A DAY WITH MEALS AND ADHERE TO REGULAR WELL BALANCED EATING SCHEDULE.  You need to bring your meter to each visit from now on.    Wear your wrist splints at night on both hands, try the gabapentin to see if that may help with  Your pain  You need to meet with Butch Penny  plyler ASAP as we will need to transition you to short acting insulin along with your lantus and work on your diet and weight  If you have records from your prior visits at [facility] please bring to your next visit.    Try eating a low salt diet as much as possible as well and decrease starchy foods and less carbohydrates.   Treatment Goals:  Goals (1 Years of Data) as of 06/27/13         As of Today 05/09/13 04/18/13 03/23/13 01/30/13     Blood Pressure    . Blood Pressure < 130/80  140/92 159/82 116/78 122/85 139/82     Result Component    . HEMOGLOBIN A1C < 7.0  9.4   10.0     . LDL CALC < 100            Progress Toward Treatment Goals:  Treatment Goal 06/27/2013  Hemoglobin A1C improved  Blood pressure improved    Self Care Goals & Plans:  Self Care Goal 06/27/2013  Manage my medications take my medicines as prescribed; bring my medications to every visit  Monitor my health keep track of my blood glucose; bring my glucose meter and log to each visit  Eat healthy foods eat more vegetables; eat foods that are low in salt; eat smaller portions; drink diet soda or water instead of juice or soda  Be physically active find an activity I enjoy    Home Blood Glucose Monitoring 06/27/2013  Check my blood sugar 3 times a day  When to check my blood sugar before meals    Care Management & Community Referrals:  Referral 05/09/2013  Referrals made for care management support none needed     1500 Calorie Diabetic Diet The 1500 calorie diabetic diet limits calories to 1500 each day. Following this  diet and making healthy meal choices can help improve overall health. It controls blood glucose (sugar) levels and can also help lower blood pressure and cholesterol.  SERVING SIZES Measuring foods and serving sizes helps to make sure you are getting the right amount of food. The list below tells how big or small some common serving sizes are.  1 oz.........4 stacked dice.  3 oz........Marland KitchenDeck of cards.  1 tsp.......Marland KitchenTip of little finger.  1 tbs......Marland KitchenMarland KitchenThumb.  2 tbs.......Marland KitchenGolf ball.   cup......Marland KitchenHalf of a fist.  1 cup.......Marland KitchenA fist. GUIDELINES FOR CHOOSING FOODS The goal of this diet is to eat a variety of foods and limit calories to 1500 each day. This can be done by choosing foods that are low in calories and fat. The diet also suggests eating small amounts of food frequently. Doing this helps control your blood glucose levels, so they do not get too high or too low. Each meal or snack may include a protein food source to help you feel more satisfied. Try to eat about the same amount of food around the same time each day. This includes weekend days, travel days, and days off work. Space your meals about 4 to  5 hours apart, and add a snack between them, if you wish.  For example, a daily food plan could include breakfast, a morning snack, lunch, dinner, and an evening snack. Healthy meals and snacks have different types of foods, including whole grains, vegetables, fruits, lean meats, poultry, fish, and dairy products. As you plan your meals, select a variety of foods. Choose from the bread and starch, vegetable, fruit, dairy, and meat/protein groups. Examples of foods from each group are listed below, with their suggested serving sizes. Use measuring cups and spoons to become familiar with what a healthy portion looks like. Bread and Starch Each serving equals 15 grams of carbohydrate.  1 slice bread.   bagel.   cup cold cereal (unsweetened).   cup hot cereal or mashed potatoes.  1  small potato (size of a computer mouse).   cup cooked pasta or rice.   English muffin.  1 cup broth-based soup.  3 cups of popcorn.  4 to 6 whole-wheat crackers.   cup cooked beans, peas, or corn. Vegetables Each serving equals 5 grams of carbohydrate.   cup cooked vegetables.  1 cup raw vegetables.   cup tomato or vegetable juice. Fruit Each serving equals 15 grams of carbohydrate.  1 small apple or orange.  1  cup watermelon or strawberries.   cup applesauce (no sugar added).  2 tbs raisins.   banana.   cup canned fruit, packed in water or in its own juice.   cup unsweetened fruit juice. Dairy Each serving equals 12 to 15 grams of carbohydrate.  1 cup fat-free milk.  6 oz artificially sweetened yogurt or plain yogurt.  1 cup low-fat buttermilk.  1 cup soy milk.  1 cup almond milk. Meat/Protein  1 large egg.  2 to 3 oz meat, poultry, or fish.   cup low-fat cottage cheese.  1 tbs peanut butter.  1 oz low-fat cheese.   cup tuna, packed in water.   cup tofu. Fat  1 tsp oil.  1 tsp trans-fat-free margarine.  1 tsp butter.  1 tsp mayonnaise.  2 tbs avocado.  1 tbs salad dressing.  1 tbs cream cheese.  2 tbs sour cream. SAMPLE 1500 CALORIE DIET PLAN Breakfast   whole-wheat English muffin (1 carb serving).  1 tsp trans-fat-free margarine.  1 scrambled egg.  1 cup fat-free milk (1 carb serving).  1 small orange (1 carb serving). Lunch  Chicken wrap.  1 whole-wheat tortilla, 8-inch (1 carb servings).  2 oz chicken breast, sliced.  2 tbs low-fat salad dressing, such as [place].   cup shredded lettuce.  2 slices tomato.   cup carrot sticks.  1 small apple (1 carb serving). Afternoon Snack  3 graham cracker squares (1 carb serving).  1 tbs peanut butter. Dinner  2 oz lean pork chop, broiled.  1 cup brown rice (3 carb servings).   cup steamed carrots.   cup green beans.  1 cup fat-free  milk (1 carb serving).  1 tsp trans-fat-free margarine. Evening Snack   cup low-fat cottage cheese.  1 small peach or pear, sliced (or  cup canned in water) (1 carb serving). MEAL PLAN You can use this worksheet to help you make a daily meal plan based on the 1500 calorie diabetic diet suggestions. If you are using this plan to help you control your blood glucose, you may interchange carbohydrate containing foods (dairy, starches, and fruits). Select a variety of fresh foods of varying colors and flavors. The total amount  of carbohydrate in your meals or snacks is more important than making sure you include all of the food groups every time you eat. You can choose from approximately this many of the following foods to build your day's meals:  6 Starches.  3 Vegetables.  2 Fruits.  2 Dairy.  4 to 6 oz Meat/Protein.  Up to 3 Fats. Your dietician can use this worksheet to help you decide how many servings and which types of foods are right for you. BREAKFAST Food Group and Servings / Food Choice Starch _________________________________________________________ Dairy __________________________________________________________ Fruit ___________________________________________________________ Meat/Protein____________________________________________________ Fat ____________________________________________________________ LUNCH Food Group and Servings / Food Choice  Starch _________________________________________________________ Meat/Protein ___________________________________________________ Vegetables _____________________________________________________ Fruit __________________________________________________________ Dairy __________________________________________________________ Fat ____________________________________________________________ Wilhemina Bonito Food Group and Servings / Food Choice Dairy __________________________________________________________ Starch  _________________________________________________________ Meat/Protein____________________________________________________ Hortense Ramal ___________________________________________________________ Wonda Cheng Food Group and Servings / Food Choice Starch _________________________________________________________ Meat/Protein ___________________________________________________ Dairy __________________________________________________________ Vegetable ______________________________________________________ Fruit ___________________________________________________________ Fat ____________________________________________________________ Fermin Schwab Food Group and Servings / Food Choice Fruit ___________________________________________________________ Meat/Protein ____________________________________________________ Dairy __________________________________________________________ Starch __________________________________________________________ DAILY TOTALS Starches _________________________ Vegetables _______________________ Fruits ____________________________ Dairy ____________________________ Meat/Protein_____________________ Fats _____________________________ Document Released: 06/15/2005 Document Revised: 02/15/2012 Document Reviewed: 10/10/2009 [employer] Patient Information 2014 [place].  Carpal Tunnel Syndrome The carpal tunnel is a narrow area located on the palm side of your wrist. The tunnel is formed by the wrist bones and ligaments. Nerves, blood vessels, and tendons pass through the carpal tunnel. Repeated wrist motion or certain diseases may cause swelling within the tunnel. This swelling pinches the main nerve in the wrist (median nerve) and causes the painful hand and arm condition called carpal tunnel syndrome. CAUSES   Repeated wrist motions.  Wrist injuries.  Certain diseases like arthritis, diabetes, alcoholism, hyperthyroidism, and kidney  failure.  Obesity.  Pregnancy. SYMPTOMS   A "pins and needles" feeling in your fingers or hand.  Tingling or numbness in your fingers or hand.  An aching feeling in your entire arm.  Wrist pain that goes up your arm to your shoulder.  Pain that goes down into your palm or fingers.  A weak feeling in your hands. DIAGNOSIS  Your caregiver will take your history and perform a physical exam. An electromyography test may be needed. This test measures electrical signals sent out by the muscles. The electrical signals are usually slowed by carpal tunnel syndrome. You may also need X-rays. TREATMENT  Carpal tunnel syndrome may clear up by itself. Your caregiver may recommend a wrist splint or medicine such as a nonsteroidal anti-inflammatory medicine. Cortisone injections may help. Sometimes, surgery may be needed to free the pinched nerve.  HOME CARE INSTRUCTIONS   Take all medicine as directed by your caregiver. Only take over-the-counter or prescription medicines for pain, discomfort, or fever as directed by your caregiver.  If you were given a splint to keep your wrist from bending, wear it as directed. It is important to wear the splint at night. Wear the splint for as long as you have pain or numbness in your hand, arm, or wrist. This may take 1 to 2 months.  Rest your wrist from any activity that may be causing your pain. If your symptoms are work-related, you may need to talk to your employer about changing to a job that does not require using your wrist.  Put ice on your wrist after long periods of wrist activity.  Put ice in a plastic bag.  Place a towel between your skin and the bag.  Leave the ice on for 15-20  minutes, 3-4 times a day.  Keep all follow-up visits as directed by your caregiver. This includes any orthopedic referrals, physical therapy, and rehabilitation. Any delay in getting necessary care could result in a delay or failure of your condition to heal. SEEK  IMMEDIATE MEDICAL CARE IF:   You have new, unexplained symptoms.  Your symptoms get worse and are not helped or controlled with medicines. MAKE SURE YOU:   Understand these instructions.  Will watch your condition.  Will get help right away if you are not doing well or get worse. Document Released: 11/20/2000 Document Revised: 02/15/2012 Document Reviewed: 10/09/2011 [facility] Patient Information 2014 [place], [place].

## 2013-06-27 NOTE — Assessment & Plan Note (Addendum)
Claims pain is getting worse.  EMG showed right medial neuropathy but no findings on left, however, now also complaining of left sided pain.  She says [facility], however, no records of such visit.  Will need to obtain and try to refer again but stressed the importance of following up with sports medicine, night splints, and improved control of weight and diabetes  -f/u sports medicine -obtain prior records -night splints -try gabapentin, [facility], but she says she did not tolerate well due to making her stomach sick, however has not picked up the prescription due to cost. Encouraged to eat regularly and not take on an empty stomach.  But up to her if she wises to try it as per Dr. Owens Shark prescribing it on last visit.  Encouraged to discuss with sports medicine, may benefit from steroid injections again

## 2013-06-27 NOTE — Assessment & Plan Note (Signed)
BP Readings from Last 3 Encounters:  06/27/13 140/92  05/09/13 159/82  04/18/13 116/78   Lab Results  Component Value Date   NA 137 04/25/2013   K 4.2 04/25/2013   CREATININE 0.97 04/25/2013    Assessment: Blood pressure control: mildly elevated Progress toward BP goal:  improved Comments: in pain due to wrist pain  Plan: Medications:  continue current medications hctz 25mg  and lisinopril 20mg  qd Educational resources provided: brochure Self management tools provided: home blood pressure logbook

## 2013-06-30 NOTE — Progress Notes (Signed)
Case discussed with Dr. Qureshi at the time of the visit.  We reviewed the resident's history and exam and pertinent patient test results.  I agree with the assessment, diagnosis, and plan of care documented in the resident's note. 

## 2013-07-04 ENCOUNTER — Encounter: Payer: Self-pay | Admitting: Dietician

## 2013-07-04 ENCOUNTER — Ambulatory Visit (INDEPENDENT_AMBULATORY_CARE_PROVIDER_SITE_OTHER): Payer: No Typology Code available for payment source | Admitting: Dietician

## 2013-07-04 VITALS — Wt 177.8 lb

## 2013-07-04 DIAGNOSIS — E119 Type 2 diabetes mellitus without complications: Secondary | ICD-10-CM

## 2013-07-04 NOTE — Progress Notes (Signed)
Diabetes Self-Management Education  Visit Number: annual follow up & visit for uncontrolled diabetes  07/04/2013 [name], identified by name and date of birth, is a 57 y.o. female with Type 2 diabetes  .  Other people present during visit: her mother is in attendacne and supportive     ASSESSMENT  Patient Concerns:    Hyperglycemia, fear of insulin, hunger  Weight 177 lb 12.8 oz (80.65 kg), last menstrual period 02/15/2009. Body mass index is 36.52 kg/(m^2).  Lab Results: LDL Cholesterol  Date Value Range Status  08/04/2012 88  0 - 99 mg/dL Final      Hemoglobin A1C  Date Value Range Status  06/27/2013 9.4   Final     CBG 175  09/04/2010 10.0   Final     Family History  Problem Relation Age of Onset  . Diabetes Mother   . Colon cancer Neg Hx   . Lung cancer Maternal Uncle   . Breast cancer Maternal Grandmother   . Liver cancer Maternal Grandfather    History  Substance Use Topics  . Smoking status: Never Smoker   . Smokeless tobacco: Never Used  . Alcohol Use: No    Support Systems:   mother and doctor office Special Needs:    no Prior DM Education:   yes Daily Foot Exams:   yes Patient Belief / Attitude about Diabetes:    diabetes can be controlled  Assessment comments:  Self monitoring: has meter, blood sugars appear much improved with most in 100s and  average of 37 readings is 177mg /dl. Afternoon readings and evening readings tend to be lower than fasting. Medications: Patient gives insulin in am due to taking all meds in am and not being a night person. She also has a history of nocturnal eating and also contiunes to periodically self adjust lantus becuase of fear of hypoglycemia ( skipped lantus on 7/23 and took 29 on another day when her fasting was 124. Have her try to use only thighs for basal insulin injections to lengthen duration of action.   Exercise: Walks to bus, to clients house and back for 40+ minute walk 3 days a week. Tries to be busy on  days off Diet Recall: drinking diet drinks and water, eating fruit, vegetables, hard boiled egg and toast for breafast, sandwich for lunch, watermelon or other fruit frequently for snacks    Individualized Plan for Diabetes Self-Management Training:  Patient individualized diabetes plan discussed today with patient and includes:  medications, monitoring,, how to handle highs and lows, Dealing daily with diabetes  Education Topics Reviewed with Patient Today:  Topic Points Discussed  Disease State    Nutrition Management  discussed ideas for lower calorie and carb snacks  Physical Activity and Exercise    Medications Site rotation for insulin injection  Monitoring Identified appropriate SMBG and A1C goals.  Acute Complications    Chronic Complications    Psychosocial Adjustment    Goal Setting Helped patient develop diabetes management plan for improving and sustaining improvement in blood sugar control  Preconception Care (if applicable)      PATIENTS GOALS      Goal The patient agrees to:  Nutrition    Physical Activity    Medications Inject in thighs only for next month  Monitoring    Problem Solving    Reducing Risk    Health Coping     Patient Self-Evaluation of Goals (Subsequent Visits)  Goal The patient rates self as meeting goals (%  of time)  Nutrition    Physical Activity    Medications    Monitoring    Problem Solving    Reducing Risk    Health Coping       PERSONALIZED PLAN / SUPPORT  Self-Management Support:  [place] office;Family;CDE visits ______________________________________________________________________  Outcomes  Expected Outcomes:  Demonstrated interest in learning. Expect positive outcomes Self-Care Barriers:  Lack of transportation;Lack of material resources Education material provided: yes If problems or questions, patient to contact team via:  Phone Time in: 1330     Time out: 1430  Future DSME appointment: - 4-6 wks   Plyler,  Butch Penny 07/04/2013 2:48 PM

## 2013-07-04 NOTE — Patient Instructions (Addendum)
Please make a follow up appointment for 3-4 weeks.   Your are doing a great job!!!  Your plan to lower your morning blood sugars is to inject the 58 units lantus in thighs- rotate from one leg to the other

## 2013-07-06 ENCOUNTER — Telehealth: Payer: Self-pay | Admitting: Licensed Clinical Social Worker

## 2013-07-06 ENCOUNTER — Encounter: Payer: Self-pay | Admitting: Licensed Clinical Social Worker

## 2013-07-06 NOTE — Telephone Encounter (Signed)
[name] was referred to CSW for transportation services.  Pt has had to limit her work schedule due to medical conditions and as a result income is limited.  Pt having difficulty affording transportation for medical appointments.  Pt is current with her [facility].  CSW informed [name] of current program available for [employer]; free transportation available to [employer].  Pt in agreement.  CSW faxed application on pt's behalf and mailed [name] information/brochure on when and where to call for scheduling.  Pt denies add'l needs at this time.  [name] has CSW contact information.

## 2013-07-12 ENCOUNTER — Other Ambulatory Visit: Payer: Self-pay | Admitting: *Deleted

## 2013-07-12 ENCOUNTER — Ambulatory Visit (INDEPENDENT_AMBULATORY_CARE_PROVIDER_SITE_OTHER): Payer: No Typology Code available for payment source | Admitting: Emergency Medicine

## 2013-07-12 ENCOUNTER — Ambulatory Visit: Payer: No Typology Code available for payment source | Admitting: Emergency Medicine

## 2013-07-12 VITALS — BP 115/77 | Ht <= 58 in | Wt 174.0 lb

## 2013-07-12 DIAGNOSIS — R109 Unspecified abdominal pain: Secondary | ICD-10-CM

## 2013-07-12 DIAGNOSIS — E119 Type 2 diabetes mellitus without complications: Secondary | ICD-10-CM

## 2013-07-12 DIAGNOSIS — I10 Essential (primary) hypertension: Secondary | ICD-10-CM

## 2013-07-12 DIAGNOSIS — G5603 Carpal tunnel syndrome, bilateral upper limbs: Secondary | ICD-10-CM

## 2013-07-12 DIAGNOSIS — Z86010 Personal history of colonic polyps: Secondary | ICD-10-CM

## 2013-07-12 DIAGNOSIS — G5600 Carpal tunnel syndrome, unspecified upper limb: Secondary | ICD-10-CM

## 2013-07-12 DIAGNOSIS — G5601 Carpal tunnel syndrome, right upper limb: Secondary | ICD-10-CM

## 2013-07-12 DIAGNOSIS — E785 Hyperlipidemia, unspecified: Secondary | ICD-10-CM

## 2013-07-12 DIAGNOSIS — E1149 Type 2 diabetes mellitus with other diabetic neurological complication: Secondary | ICD-10-CM

## 2013-07-12 MED ORDER — HYDROCHLOROTHIAZIDE 25 MG PO TABS: 25.0000 mg | ORAL_TABLET | Freq: Every day | ORAL | Status: DC

## 2013-07-12 NOTE — Assessment & Plan Note (Signed)
The patient has orange cardt and has been unable to see a Copy in [place].  She declined further injections or by mouth medications. She feels that her symptoms are starting to affect her work. As such he is being referred to [facility] for evaluation and potential surgical release.

## 2013-07-12 NOTE — Progress Notes (Signed)
  Subjective:    Patient ID: Nancy [name], female    DOB: [birth year], 57 y.o.   MRN: [id number]  HPI This is a 57 year old female who presents for followup to the sports medicine clinic with complaints of carpal tunnel syndrome bilaterally. She's been seen in the past for carpal tunnel syndrome had injections which did not provide relief. Tried oral pain medication which also did not right mitral leaflet and she did not tolerate the side effects of the gabapentin. She continues to wear her braces in the evening and at night however, she reports her symptoms progressively worsening. The right is worse than the left. She has been unable to obtain surgical evaluation at this time.   Review of Systems  Constitutional: Negative for activity change.  Neurological: Positive for weakness and numbness.   as per history of present illness and above otherwise negative   Past Medical History  Diagnosis Date  . Diabetic peripheral neuropathy   . Carpal tunnel syndrome, bilateral     confirmed on nerve conduction studies  . Hyperlipidemia   . Diabetes mellitus type 2, uncontrolled   . GERD (gastroesophageal reflux disease)   . Heat edema   . History of Helicobacter infection 11/07  . History of chest pain     negative cardiolite September 2007.  ETT-myoview 12/10 with EF 76%, no ischemia,   . History of endometriosis    Past Surgical History  Procedure Laterality Date  . Abdominal hysterectomy         Objective:   Physical Exam BP 115/77  Ht 4\' 9"  (1.448 m)  Wt 174 lb (78.926 kg)  BMI 37.64 kg/m2  LMP 02/15/2009  57 year old female alert oriented in no acute distress.  Bilateral Wrists: Inspection normal with no visible erythema or swelling. ROM smooth and normal with good flexion and extension and ulnar/radial deviation that is symmetrical with opposite wrist. Palpation is normal over metacarpals, navicular, lunate, and TFCC; tendons without tenderness/ swelling Strength 5/5 in  all directions without pain. Positive Finkelstein's and Tinel's.  Neurovascularly intact in bilateral upper extremities.  Results of EMG studies were reviewed and show evidence of right carpal tunnel syndrome markedly worse than the left.

## 2013-07-12 NOTE — Patient Instructions (Addendum)
You have been scheduled for an appt at [facility] - comp rehab on 07/26/13 at 9:15 am. Their phone number is [phone] Please call their financial counselor to discuss payment options at [phone]

## 2013-07-26 DIAGNOSIS — G5610 Other lesions of median nerve, unspecified upper limb: Secondary | ICD-10-CM | POA: Insufficient documentation

## 2013-07-27 ENCOUNTER — Ambulatory Visit: Payer: No Typology Code available for payment source | Admitting: Dietician

## 2013-07-31 ENCOUNTER — Other Ambulatory Visit: Payer: Self-pay | Admitting: *Deleted

## 2013-07-31 DIAGNOSIS — K219 Gastro-esophageal reflux disease without esophagitis: Secondary | ICD-10-CM

## 2013-08-01 ENCOUNTER — Other Ambulatory Visit: Payer: Self-pay | Admitting: *Deleted

## 2013-08-01 DIAGNOSIS — K219 Gastro-esophageal reflux disease without esophagitis: Secondary | ICD-10-CM

## 2013-08-01 MED ORDER — ESOMEPRAZOLE MAGNESIUM 40 MG PO CPDR: 40.0000 mg | DELAYED_RELEASE_CAPSULE | Freq: Every day | ORAL | Status: DC

## 2013-08-01 NOTE — Telephone Encounter (Signed)
last filled 6/24 Please do a 3 month supply

## 2013-08-01 NOTE — Telephone Encounter (Signed)
Faxed in

## 2013-08-01 NOTE — Telephone Encounter (Signed)
She hasn't had this refilled since January. Does she still take it?? It is also over the counter now.

## 2013-08-18 ENCOUNTER — Encounter: Payer: Self-pay | Admitting: Internal Medicine

## 2013-08-18 ENCOUNTER — Ambulatory Visit (HOSPITAL_COMMUNITY)
Admission: RE | Admit: 2013-08-18 | Discharge: 2013-08-18 | Disposition: A | Discharge status: Home / Self Care | Payer: No Typology Code available for payment source | Source: Ambulatory Visit | Attending: Internal Medicine | Admitting: Internal Medicine

## 2013-08-18 ENCOUNTER — Ambulatory Visit (INDEPENDENT_AMBULATORY_CARE_PROVIDER_SITE_OTHER): Payer: No Typology Code available for payment source | Admitting: Internal Medicine

## 2013-08-18 VITALS — BP 142/91 | HR 77 | Temp 97.3°F | Ht 58.5 in | Wt 179.8 lb

## 2013-08-18 DIAGNOSIS — I10 Essential (primary) hypertension: Secondary | ICD-10-CM

## 2013-08-18 DIAGNOSIS — G540 Brachial plexus disorders: Secondary | ICD-10-CM

## 2013-08-18 DIAGNOSIS — M5414 Radiculopathy, thoracic region: Secondary | ICD-10-CM | POA: Insufficient documentation

## 2013-08-18 DIAGNOSIS — Z23 Encounter for immunization: Secondary | ICD-10-CM

## 2013-08-18 DIAGNOSIS — E1149 Type 2 diabetes mellitus with other diabetic neurological complication: Secondary | ICD-10-CM

## 2013-08-18 DIAGNOSIS — R079 Chest pain, unspecified: Secondary | ICD-10-CM | POA: Insufficient documentation

## 2013-08-18 DIAGNOSIS — R0789 Other chest pain: Secondary | ICD-10-CM

## 2013-08-18 LAB — GLUCOSE, CAPILLARY: Glucose-Capillary: 134 mg/dL — ABNORMAL HIGH (ref 70–99)

## 2013-08-18 NOTE — Assessment & Plan Note (Addendum)
Assessment:  Patient with 2-3 month localized pain and numbness beneath left breast.  Pain is not related to activity, is well localized and reproducible on exam.  No associated N/V, diaphoresis, SOB or syncope.  Unremarkable exam and EKG NSR, no ischemic changes.  This is less likely ACS.  Patient has history of DM neuropathy and carpal tunnel syndrome.  The numbness points to likely nervous etiology.  Patient s/p R carpal tunnel release and scheduled for follow-up next week.  Also with L carpal tunnel with plans for surgery in the near future.    Plan:   1) Patient to follow-up next week with Dr. Nicoletta Dress (orthopedist) at [facility] regarding the numbness/pain  2) Patient declined restarting Gabapentin to help neuropathic pain.  3) Continue ASA, statin, lisinopril, HCTZ, metformin and insulin.  4) Patient advised to go to the ER with worsening or severe symptoms including chest pain lasting longer than a few minutes, dyspnea, N/V, lightheadedness.

## 2013-08-18 NOTE — Assessment & Plan Note (Signed)
Assessment:  Slightly elevated today, 140/92.  Patient reports compliance with medications and elevation may be related to pain as patient is s/p R carpal tunnel surgery.  Plan:   1) Continue current regimen:  HCTZ 25mg  daily and lisinopril 20mg  daily.  2) Return for follow-up in 1 month.

## 2013-08-18 NOTE — Patient Instructions (Signed)
1. Your pain and numbness is likely related to a problem with the nerves in your arm and could be related to your diabetic neuropathy.  Please keep your follow-up appointment with Dr. Nicoletta Dress at [facility] on 08/25/13.       Return to clinic for follow up appointment in 1 month.   2. Please take all medications as prescribed.    3. If you have worsening of your symptoms or new symptoms arise, please call the clinic [phone]), or go to the ER immediately if symptoms are severe.

## 2013-08-18 NOTE — Progress Notes (Signed)
  Subjective:    Patient ID: Nancy [name], female    DOB: [birth year], 57 y.o.   MRN: [id number]  HPI Comments: Nancy [name] is a 57 year old woman with a PMH of HTN, DM type 2 with peripheral neuropathy, HLD and B/L carpal tunnel syndrome.  She presents with 3 month history of pain and numbness under her L breast.  She describes the pain as tightness that occurs daily 4-5 time in a day.  It lasts for 10-20 minutes and goes away on its own.  She is able to localize pain.  She denies SOB, N/V, radiation into the jaw or back, lightheadedness or syncope during these episodes.  She has not tried to do anything for the pain and says it subsides on its own.  She has had negative work up for atypical CP in the past and had negative Cardiolite (2007).  An ECHO in 2011 revealed grade 1 diastolic dysfunction.  She had negative ABI in June 2014.  She reports compliance with medications including ASA, atorvastatin, HCTZ, lisinopril, metformin and Lantus.     Review of Systems  Constitutional: Negative for diaphoresis.  Cardiovascular: Positive for chest pain. Negative for leg swelling.  Gastrointestinal: Negative for nausea and vomiting.  Neurological: Negative for syncope and light-headedness.       Objective:   Physical Exam  Constitutional: She is oriented to person, place, and time. She appears well-developed and well-nourished. No distress.  HENT:  Head: Normocephalic and atraumatic.  Cardiovascular: Normal rate, regular rhythm and normal heart sounds.  Exam reveals no gallop.   No murmur heard. No carotid bruits.  No JVD.  Left chest and beneath breast TTP.  Pain reproducible.  Pulmonary/Chest: Effort normal and breath sounds normal. No respiratory distress. She has no wheezes. She has no rales.  Abdominal: Soft. Bowel sounds are normal. She exhibits no distension. There is no tenderness.  Musculoskeletal:  No pretibial edema   Neurological: She is alert and oriented to person, place, and  time.  Skin: Skin is warm. She is not diaphoretic.  Psychiatric: She has a normal mood and affect. Her behavior is normal.          Assessment & Plan:  Please see problem based assessment and plan.

## 2013-08-21 ENCOUNTER — Encounter (HOSPITAL_COMMUNITY): Payer: Self-pay | Admitting: *Deleted

## 2013-08-21 ENCOUNTER — Emergency Department (HOSPITAL_COMMUNITY): Payer: No Typology Code available for payment source

## 2013-08-21 ENCOUNTER — Emergency Department (HOSPITAL_COMMUNITY)
Admission: EM | Admit: 2013-08-21 | Discharge: 2013-08-21 | Disposition: A | Discharge status: Home / Self Care | Payer: No Typology Code available for payment source | Attending: Emergency Medicine | Admitting: Emergency Medicine

## 2013-08-21 DIAGNOSIS — E1149 Type 2 diabetes mellitus with other diabetic neurological complication: Secondary | ICD-10-CM | POA: Insufficient documentation

## 2013-08-21 DIAGNOSIS — Z794 Long term (current) use of insulin: Secondary | ICD-10-CM | POA: Insufficient documentation

## 2013-08-21 DIAGNOSIS — E785 Hyperlipidemia, unspecified: Secondary | ICD-10-CM | POA: Insufficient documentation

## 2013-08-21 DIAGNOSIS — Z79899 Other long term (current) drug therapy: Secondary | ICD-10-CM | POA: Insufficient documentation

## 2013-08-21 DIAGNOSIS — E1142 Type 2 diabetes mellitus with diabetic polyneuropathy: Secondary | ICD-10-CM | POA: Insufficient documentation

## 2013-08-21 DIAGNOSIS — Z7982 Long term (current) use of aspirin: Secondary | ICD-10-CM | POA: Insufficient documentation

## 2013-08-21 DIAGNOSIS — K219 Gastro-esophageal reflux disease without esophagitis: Secondary | ICD-10-CM | POA: Insufficient documentation

## 2013-08-21 DIAGNOSIS — R0789 Other chest pain: Secondary | ICD-10-CM

## 2013-08-21 DIAGNOSIS — I10 Essential (primary) hypertension: Secondary | ICD-10-CM | POA: Insufficient documentation

## 2013-08-21 LAB — POCT I-STAT TROPONIN I: Troponin i, poc: 0 ng/mL (ref 0.00–0.08)

## 2013-08-21 LAB — COMPREHENSIVE METABOLIC PANEL
Albumin: 3.5 g/dL (ref 3.5–5.2)
BUN: 12 mg/dL (ref 6–23)
Chloride: 100 mEq/L (ref 96–112)
Creatinine, Ser: 0.69 mg/dL (ref 0.50–1.10)
GFR calc Af Amer: >90 mL/min (ref >90)
GFR calc non Af Amer: >90 mL/min (ref >90)
Glucose, Bld: 140 mg/dL — ABNORMAL HIGH (ref 70–99)
Total Bilirubin: 0.3 mg/dL (ref 0.3–1.2)

## 2013-08-21 LAB — CBC
HCT: 38.6 % (ref 36.0–46.0)
Hemoglobin: 13.9 g/dL (ref 12.0–15.0)
MCV: 77.2 fL — ABNORMAL LOW (ref 78.0–100.0)
RDW: 13.8 % (ref 11.5–15.5)
WBC: 8.6 10*3/uL (ref 4.0–10.5)

## 2013-08-21 NOTE — ED Notes (Signed)
Dr. Hess at the bedside.  

## 2013-08-21 NOTE — ED Notes (Signed)
Pt with L chest throbbing x 3 months.  She originally thought it was heart burn b/c she has been doing a lot of burping.  Carpal tunnel surgery to R wrist 1 1/2 weeks ago.  She told her pcp about the pain and they told her to come here .  Describes pain as numbness and soreness under L breast.  Came to ED c/o breast pain, that is why pt was not triaged sooner.

## 2013-08-21 NOTE — ED Provider Notes (Signed)
CSN: [id number]     Arrival date & time 08/21/13  1200 History   First MD Initiated Contact with Patient 08/21/13 1350     Chief Complaint  Patient presents with  . Chest Pain    HPI Comments: Pt is a 57 y/o female with PMHx of HLD, HTN, Type II DM uncontrolled who presents with 2-3 months of ongoing left sided chest pain that has worsened over the past 2-3 days.  Pt states she noticed the pain initially a couple of months ago, describing it as sharp, stabbing along the lateral inferior chest wall w/o radiation into her jaw or down her arms.  She denies any trauma, rash, travel, or sick contacts at that time.  Does not note that pain has improved with rest, worsened with any activity or position, and does not move substernal.  She has recently had carpal tunnel release, with general anesthesia, within the past 10 days and saw her PCP three days ago at which point an EKG was performed showing NSR, normal EKG.  Since that point, she believes the pain has intensified despite no changes in her daily activity.  She decided to come in the ED today for further evaluation.  She denies any nausea, vomiting, shortness of breath, wheezing, productive cough, recent sick contacts, rashes on any part of her body, HA, blurred vision, diplopia, lower extremity edema (unilateral/bilateral), abdominal pain, constipation, diarrhea.  She does not smoke or ever has smoked, denies alcohol or drug use.  FHx includes a brother who passed away at the age of 56 from acute CHF exacerbation, otherwise unremarkable.     The history is provided by the patient.    Past Medical History  Diagnosis Date  . Diabetic peripheral neuropathy   . Carpal tunnel syndrome, bilateral     confirmed on nerve conduction studies  . Hyperlipidemia   . Diabetes mellitus type 2, uncontrolled   . GERD (gastroesophageal reflux disease)   . Heat edema   . History of Helicobacter infection 11/07  . History of chest pain     negative cardiolite  September 2007.  ETT-myoview 12/10 with EF 76%, no ischemia,   . History of endometriosis   . Carpal tunnel syndrome    Past Surgical History  Procedure Laterality Date  . Abdominal hysterectomy    . Carpal tunnel release     Family History  Problem Relation Age of Onset  . Diabetes Mother   . Colon cancer Neg Hx   . Lung cancer Maternal Uncle   . Breast cancer Maternal Grandmother   . Liver cancer Maternal Grandfather    History  Substance Use Topics  . Smoking status: Never Smoker   . Smokeless tobacco: Never Used  . Alcohol Use: No   OB History   Grav Para Term Preterm Abortions TAB SAB Ect Mult Living                 Review of Systems  Constitutional:       Per HPI, all other Sx negative     Allergies  Amitriptyline hcl  Home Medications   Current Outpatient Rx  Name  Route  Sig  Dispense  Refill  . aspirin 81 MG EC tablet   Oral   Take 81 mg by mouth daily.           Marland Kitchen atorvastatin (LIPITOR) 20 MG tablet   Oral   Take 1 tablet (20 mg total) by mouth daily.   30 tablet  5   . Blood Glucose Monitoring Suppl (BLOOD GLUCOSE METER) kit      Use to check blood sugar up to 3 times a day DX code 250.02 insulin requiring   1 each   0   . esomeprazole (NEXIUM) 40 MG capsule   Oral   Take 1 capsule (40 mg total) by mouth daily.   30 capsule   5   . gabapentin (NEURONTIN) 300 MG capsule   Oral   Take 1 capsule (300 mg total) by mouth 3 (three) times daily.   90 capsule   2   . glucose blood (WAVESENSE PRESTO) test strip      Use to check blood sugar up to 3 times a day DX code 250.02 insulin requiring   100 each   12   . hydrochlorothiazide (HYDRODIURIL) 25 MG tablet   Oral   Take 1 tablet (25 mg total) by mouth daily.   30 tablet   5   . insulin glargine (LANTUS) 100 UNIT/ML injection   Subcutaneous   Inject 0.58 mLs (58 Units total) into the skin every morning. When you wake up   30 mL   3   . Insulin Pen Needle (RELION MINI PEN  NEEDLES) 31G X 6 MM MISC      Use to take insulin injections 3 times a day before meals   100 each   5   . Lancets MISC      Use to check blood sugar 3 times a day   100 each   5   . lisinopril (PRINIVIL,ZESTRIL) 20 MG tablet   Oral   Take 1 tablet (20 mg total) by mouth daily.   30 tablet   11   . metFORMIN (GLUCOPHAGE) 1000 MG tablet   Oral   Take 1 tablet (1,000 mg total) by mouth 2 (two) times daily with a meal.   60 tablet   5    BP 137/84  Pulse 70  Temp(Src) 98.3 F (36.8 C) (Oral)  Resp 20  SpO2 98%  LMP 02/15/2009 Physical Exam  Constitutional: She is oriented to person, place, and time. She appears well-developed and well-nourished. No distress.  HENT:  Head: Normocephalic and atraumatic.  Eyes: EOM are normal. Pupils are equal, round, and reactive to light.  Neck: Normal range of motion.  Cardiovascular: Normal rate, regular rhythm and intact distal pulses.   No murmur heard. Negative Homan Sign B/L, no Calf Tenderness B/L, Neurovascular intact distal extremities B/L   Pulmonary/Chest: Effort normal and breath sounds normal. No respiratory distress. She has no wheezes. She has no rales. She exhibits no tenderness.  Abdominal: Bowel sounds are normal.  Neurological: She is alert and oriented to person, place, and time. No cranial nerve deficit.  Skin: No bruising, no burn, no ecchymosis, no laceration, no lesion and no rash noted. She is not diaphoretic. No erythema.    ED Course  Procedures (including critical care time)  Date: 08/21/2013  Rate: 70  Rhythm: normal sinus rhythm  QRS Axis: normal  Intervals: normal  ST/T Wave abnormalities: normal  Conduction Disutrbances: none  Narrative Interpretation: Normal EKG  Old EKG Reviewed: No significant changes noted  Labs Review Labs Reviewed  CBC - Abnormal; Notable for the following:    MCV 77.2 (*)    All other components within normal limits  COMPREHENSIVE METABOLIC PANEL - Abnormal; Notable  for the following:    Glucose, Bld 140 (*)    All other components  within normal limits  GLUCOSE, CAPILLARY - Abnormal; Notable for the following:    Glucose-Capillary 120 (*)    All other components within normal limits  D-DIMER, QUANTITATIVE  POCT I-STAT TROPONIN I   Imaging Review Dg Chest 2 View  08/21/2013   *RADIOLOGY REPORT*  Clinical Data: Chest pain  CHEST - 2 VIEW  Comparison: 09/09/2009  Findings: Cardiomediastinal silhouette is within normal limits. The lungs are clear. No pleural effusion.  No pneumothorax.  No acute osseous abnormality.  IMPRESSION: Normal chest.   Original Report Authenticated By: Conchita Paris, M.D.    MDM   1. Atypical chest pain   2. HYPERTENSION   3. DIABETIC PERIPHERAL NEUROPATHY   Pt 57 y/o female with non-typical to atypical angina.  This has been ongoing for the past 2-3 months now, more of sharp stabbing pain that is along the T5 dermatome.  Pt states the pain has worsened over the past three days when she was seen by her PCP, had an EKG that looked basically unchanged at that time.  Included in the differential is ACS, pericardial effusion, aortic dissection, pleural effusion, pulmonary embolus, acute pneumonia, GERD, Herpes Zoster.  As this has been ongoing for the past several months and only recently worsened, which is described as similar pain, pericardial effusion, aortic dissection, or acute pneumonia are less likely.  Will get EKG, POC troponin, and labs for possible ACS, however, her TIMI score is 2, Heart Score is 2, and her ASCVD score is 15.3% over 10 yrs, but 1.8% with optimal risk factor management, which she is currently on with ASA, Lisinopril, Lipitor, Metformin, and Insulin.  Top three in the differential are Chronic Pulmonary Embolus, GERD, or Herpes Zoster.  Pt describes borderline pleuritic chest pain at times, also burning pain that wraps around her lower left breast.  It does not worsen with activity, improve with rest, or is  substernal w/ radiation.  CXR shows normal chest, O2 saturations are 98-100% on RA currently, Well's Score of 1.5.  Pt does not recall rash or previous herpes zoster infection, but her pain is very dermatomal and describes as sharp burning stabbing pain.    2:58 PM - EKG shows NSR, unchanged from three days ago.  Her POC troponin was 0.00.  Will add on D-Dimer and reassess.   3:45 PM - D-Dimer is negative and her lab work is grossly normal.  Most likely chest wall pain vs zoster.  Pt understands implications to come back and will f/u with PCP w/in next two weeks.    Tamela Oddi Awanda Mink, DO of [facility] 08/21/2013, 3:46 PM    Nolon Rod, DO 08/21/13 1546

## 2013-08-21 NOTE — ED Notes (Signed)
Pt feeling a little funny. Took insulin this morning but hasn't eaten anything. CBG check of 120 completed. Dr. Christy Gentles at the bedside.

## 2013-08-21 NOTE — ED Notes (Signed)
Dr. Awanda Mink back at the bedside.

## 2013-08-22 NOTE — ED Provider Notes (Signed)
I have personally seen and examined the patient.  I have discussed the plan of care with the resident.  I have reviewed the documentation on PMH/FH/Soc. History.  I have reviewed the documentation of the resident and agree.  I have reviewed and agree with the ECG interpretation(s) documented by the resident.   Sharyon Cable, MD 08/22/13 1556

## 2013-09-05 ENCOUNTER — Ambulatory Visit (INDEPENDENT_AMBULATORY_CARE_PROVIDER_SITE_OTHER): Payer: No Typology Code available for payment source | Admitting: Internal Medicine

## 2013-09-05 ENCOUNTER — Encounter: Payer: Self-pay | Admitting: Internal Medicine

## 2013-09-05 VITALS — BP 140/92 | HR 81 | Temp 97.3°F | Ht <= 58 in | Wt 182.0 lb

## 2013-09-05 DIAGNOSIS — IMO0002 Reserved for concepts with insufficient information to code with codable children: Secondary | ICD-10-CM

## 2013-09-05 DIAGNOSIS — E1165 Type 2 diabetes mellitus with hyperglycemia: Secondary | ICD-10-CM

## 2013-09-05 DIAGNOSIS — M5414 Radiculopathy, thoracic region: Secondary | ICD-10-CM

## 2013-09-05 DIAGNOSIS — E119 Type 2 diabetes mellitus without complications: Secondary | ICD-10-CM

## 2013-09-05 LAB — GLUCOSE, CAPILLARY: Glucose-Capillary: 116 mg/dL — ABNORMAL HIGH (ref 70–99)

## 2013-09-05 MED ORDER — GABAPENTIN 100 MG PO CAPS: 200.0000 mg | ORAL_CAPSULE | Freq: Three times a day (TID) | ORAL | Status: DC

## 2013-09-05 NOTE — Progress Notes (Signed)
Patient ID: Nancy [name], female   DOB: August 13, 1956, 57 y.o.   MRN: [id number]   Subjective:   Patient ID: Nancy [name] female   DOB: 1956/09/29 57 y.o.   MRN: [id number]  HPI: Nancy [name] is a 57 y.o. female with a PMH of DM2, B/L Carpal tunnel, HLD, HTN, GERD.  She presents today again for follow up of her atypical left side chest pain. She reports the pain still has not improved.  She reports the pain is located below the left breast and radiates around to her back.  The pain is 10/10, described as sharp, burning, and stabbing.  It is associated with numbness of the area.  She reports only alleviating factor is Norco which she was prescribed for pain after her carpal tunnel sx on the right side.  She reports pain is present when laying flat and sitting up, at various positions, and not associated with eating.  She denies SOB. She has a previous echo from 2011 with 60-65% EF, grade 1 diastolic dysfunction and no regional wall abnormalities.    Past Medical History  Diagnosis Date  . Diabetic peripheral neuropathy   . Carpal tunnel syndrome, bilateral     confirmed on nerve conduction studies  . Hyperlipidemia   . Diabetes mellitus type 2, uncontrolled   . GERD (gastroesophageal reflux disease)   . Heat edema   . History of Helicobacter infection 11/07  . History of chest pain     negative cardiolite September 2007.  ETT-myoview 12/10 with EF 76%, no ischemia,   . History of endometriosis   . Carpal tunnel syndrome    Current Outpatient Prescriptions  Medication Sig Dispense Refill  . aspirin 325 MG EC tablet Take 325 mg by mouth daily.      Marland Kitchen atorvastatin (LIPITOR) 20 MG tablet Take 1 tablet (20 mg total) by mouth daily.  30 tablet  5  . Blood Glucose Monitoring Suppl (BLOOD GLUCOSE METER) kit Use to check blood sugar up to 3 times a day DX code 250.02 insulin requiring  1 each  0  . esomeprazole (NEXIUM) 40 MG capsule Take 1 capsule (40 mg total) by mouth daily.  30  capsule  5  . gabapentin (NEURONTIN) 100 MG capsule Take 2 capsules (200 mg total) by mouth 3 (three) times daily.  180 capsule  0  . glucose blood (WAVESENSE PRESTO) test strip Use to check blood sugar up to 3 times a day DX code 250.02 insulin requiring  100 each  12  . hydrochlorothiazide (HYDRODIURIL) 25 MG tablet Take 1 tablet (25 mg total) by mouth daily.  30 tablet  5  . HYDROcodone-acetaminophen (NORCO/VICODIN) 5-325 MG per tablet Take 1-2 tablets by mouth every 6 (six) hours as needed for pain.      Marland Kitchen insulin glargine (LANTUS) 100 UNIT/ML injection Inject 0.58 mLs (58 Units total) into the skin every morning. When you wake up  30 mL  3  . Insulin Pen Needle (RELION MINI PEN NEEDLES) 31G X 6 MM MISC Use to take insulin injections 3 times a day before meals  100 each  5  . Lancets MISC Use to check blood sugar 3 times a day  100 each  5  . lisinopril (PRINIVIL,ZESTRIL) 20 MG tablet Take 1 tablet (20 mg total) by mouth daily.  30 tablet  11  . metFORMIN (GLUCOPHAGE) 1000 MG tablet Take 1 tablet (1,000 mg total) by mouth 2 (two) times daily with a meal.  60 tablet  5   No current facility-administered medications for this visit.   Family History  Problem Relation Age of Onset  . Diabetes Mother   . Colon cancer Neg Hx   . Lung cancer Maternal Uncle   . Breast cancer Maternal Grandmother   . Liver cancer Maternal Grandfather    History   Social History  . Marital Status: Single    Spouse Name: N/A    Number of Children: 0  . Years of Education: 11   Occupational History  . CNA    Social History Main Topics  . Smoking status: Never Smoker   . Smokeless tobacco: Never Used  . Alcohol Use: No  . Drug Use: No  . Sexual Activity: None   Other Topics Concern  . None   Social History Narrative   Financial assistance approved for 100% discount at [facility] and has [facility] card per [employer]   09/26/2010   Review of Systems: Review of Systems  Constitutional: Negative for fever,  chills, weight loss and malaise/fatigue.  HENT: Negative for congestion.   Eyes: Negative for blurred vision.  Respiratory: Negative for cough, sputum production and shortness of breath.   Cardiovascular: Positive for chest pain. Negative for palpitations, orthopnea and leg swelling.  Gastrointestinal: Negative for heartburn, nausea, vomiting, abdominal pain, diarrhea and constipation.  Genitourinary: Negative for dysuria.  Skin: Negative for rash.  Neurological: Negative for dizziness and headaches.  Psychiatric/Behavioral: Negative for depression.     Objective:  Physical Exam: Filed Vitals:   09/05/13 1456  BP: 140/92  Pulse: 81  Temp: 97.3 F (36.3 C)  TempSrc: Oral  Height: 4\' 10"  (1.473 m)  Weight: 182 lb (82.555 kg)  SpO2: 98%  Physical Exam  Nursing note and vitals reviewed. Constitutional: She is well-developed, well-nourished, and in no distress. No distress.  HENT:  Head: Normocephalic and atraumatic.  Eyes: Conjunctivae are normal.  Cardiovascular: Normal rate, regular rhythm, normal heart sounds and intact distal pulses.   No murmur heard. Pulmonary/Chest: Effort normal and breath sounds normal. No respiratory distress. She has no wheezes. She has no rales.  Abdominal: Soft. Bowel sounds are normal. She exhibits no distension. There is no tenderness.  Musculoskeletal: She exhibits no edema.  Neurological:  Reports decreased sensation to touch along T5 dermatone on the left.  Skin: Skin is warm and dry. No rash noted. She is not diaphoretic.  Psychiatric: Affect and judgment normal.     Assessment & Plan:   See Problem Based Assessment and Plan Meds ordered this encounter  Medications  . gabapentin (NEURONTIN) 100 MG capsule    Sig: Take 2 capsules (200 mg total) by mouth 3 (three) times daily.    Dispense:  180 capsule    Refill:  0    Orders Placed This Encounter  Procedures  . Glucose, capillary

## 2013-09-05 NOTE — Patient Instructions (Signed)
Start taking Gabapentin 200mg  (2 capsules) three times a day for pain, if it does not improve after 1 week you can increase to 300mg  three times a day. Follow up with Hand Surgeon  Gabapentin capsules or tablets What is this medicine? GABAPENTIN (GA ba pen tin) is used to control partial seizures in adults with epilepsy. It is also used to treat certain types of nerve pain. This medicine may be used for other purposes; ask your health care provider or pharmacist if you have questions. What should I tell my health care provider before I take this medicine? They need to know if you have any of these conditions: -kidney disease -suicidal thoughts, plans, or attempt; a previous suicide attempt by you or a family member -an unusual or allergic reaction to gabapentin, other medicines, foods, dyes, or preservatives -pregnant or trying to get pregnant -breast-feeding How should I use this medicine? Take this medicine by mouth. Swallow it with a drink of water. Follow the directions on the prescription label. If this medicine upsets your stomach, take it with food or milk. Take your medicine at regular intervals. Do not take it more often than directed. If you are directed to break the 600 or 800 mg tablets in half as part of your dose, the extra half tablet should be used for the next dose. If you have not used the extra half tablet within 3 days, it should be thrown away. A special MedGuide will be given to you by the pharmacist with each prescription and refill. Be sure to read this information carefully each time. Talk to your pediatrician regarding the use of this medicine in children. Special care may be needed. Overdosage: If you think you have taken too much of this medicine contact a poison control center or emergency room at once. NOTE: This medicine is only for you. Do not share this medicine with others. What if I miss a dose? If you miss a dose, take it as soon as you can. If it is almost  time for your next dose, take only that dose. Do not take double or extra doses. What may interact with this medicine? -antacids -hydrocodone -morphine -naproxen -sevelamer This list may not describe all possible interactions. Give your health care provider a list of all the medicines, herbs, non-prescription drugs, or dietary supplements you use. Also tell them if you smoke, drink alcohol, or use illegal drugs. Some items may interact with your medicine. What should I watch for while using this medicine? Visit your doctor or health care professional for regular checks on your progress. You may want to keep a record at home of how you feel your condition is responding to treatment. You may want to share this information with your doctor or health care professional at each visit. You should contact your doctor or health care professional if your seizures get worse or if you have any new types of seizures. Do not stop taking this medicine or any of your seizure medicines unless instructed by your doctor or health care professional. Stopping your medicine suddenly can increase your seizures or their severity. Wear a medical identification bracelet or chain if you are taking this medicine for seizures, and carry a card that lists all your medications. You may get drowsy, dizzy, or have blurred vision. Do not drive, use machinery, or do anything that needs mental alertness until you know how this medicine affects you. To reduce dizzy or fainting spells, do not sit or stand up quickly,  especially if you are an older patient. Alcohol can increase drowsiness and dizziness. Avoid alcoholic drinks. Your mouth may get dry. Chewing sugarless gum or sucking hard candy, and drinking plenty of water will help. The use of this medicine may increase the chance of suicidal thoughts or actions. Pay special attention to how you are responding while on this medicine. Any worsening of mood, or thoughts of suicide or dying  should be reported to your health care professional right away. Women who become pregnant while using this medicine may enroll in the [place] Pregnancy Registry by calling [phone]. This registry collects information about the safety of antiepileptic drug use during pregnancy. What side effects may I notice from receiving this medicine? Side effects that you should report to your doctor or health care professional as soon as possible: -allergic reactions like skin rash, itching or hives, swelling of the face, lips, or tongue -worsening of mood, thoughts or actions of suicide or dying Side effects that usually do not require medical attention (report to your doctor or health care professional if they continue or are bothersome): -constipation -difficulty walking or controlling muscle movements -nausea -slurred speech -tremors -weight gain This list may not describe all possible side effects. Call your doctor for medical advice about side effects. You may report side effects to FDA at 1-800-FDA-1088. Where should I keep my medicine? Keep out of reach of children. Store at room temperature between 15 and 30 degrees C (59 and 86 degrees F). Throw away any unused medicine after the expiration date. NOTE: This sheet is a summary. It may not cover all possible information. If you have questions about this medicine, talk to your doctor, pharmacist, or health care provider.  2012, Elsevier/Gold Standard. (07/22/2010 6:06:26 PM)

## 2013-09-05 NOTE — Assessment & Plan Note (Signed)
Patient's atypical chest pain has been worked up in our office 3 weeks ago and again at ED.  EKG x2 was negative, CXR was negative, Troponin negative, D-Dimer negative.  Her chest pain is located along a dermatomal distribution but a rash is still not present over the area.  The pain she describes sounds like neuropathic pain (buring, stabbing, associated with numbness of the area).  ?Patient's symptoms related to pain from carpal tunnel.  She is to be reevaluated by her surgeon who plans carpal tunnel surgery on her left hand.   Will start patient on Gabapentin 200mg  TID for neuropathic pain, patient instructed to return if pain does not improve.

## 2013-09-08 NOTE — Progress Notes (Signed)
I saw and evaluated the patient.  I personally confirmed the key portions of the history and exam documented by Dr. Hoffman and I reviewed pertinent patient test results.  The assessment, diagnosis, and plan were formulated together and I agree with the documentation in the resident's note. 

## 2013-09-26 ENCOUNTER — Encounter: Payer: No Typology Code available for payment source | Admitting: Internal Medicine

## 2013-09-26 ENCOUNTER — Encounter: Payer: Self-pay | Admitting: Internal Medicine

## 2013-09-26 ENCOUNTER — Ambulatory Visit: Payer: No Typology Code available for payment source | Attending: Internal Medicine | Admitting: Occupational Therapy

## 2013-09-26 DIAGNOSIS — M2560 Stiffness of unspecified joint, not elsewhere classified: Secondary | ICD-10-CM | POA: Insufficient documentation

## 2013-09-26 DIAGNOSIS — IMO0001 Reserved for inherently not codable concepts without codable children: Secondary | ICD-10-CM | POA: Insufficient documentation

## 2013-09-26 DIAGNOSIS — M6281 Muscle weakness (generalized): Secondary | ICD-10-CM | POA: Insufficient documentation

## 2013-09-26 DIAGNOSIS — M2550 Pain in unspecified joint: Secondary | ICD-10-CM | POA: Insufficient documentation

## 2013-09-26 DIAGNOSIS — R279 Unspecified lack of coordination: Secondary | ICD-10-CM | POA: Insufficient documentation

## 2013-09-29 ENCOUNTER — Ambulatory Visit: Payer: No Typology Code available for payment source | Admitting: Occupational Therapy

## 2013-10-02 ENCOUNTER — Ambulatory Visit: Payer: No Typology Code available for payment source | Admitting: Occupational Therapy

## 2013-10-04 ENCOUNTER — Ambulatory Visit: Payer: No Typology Code available for payment source | Admitting: Occupational Therapy

## 2013-10-10 ENCOUNTER — Ambulatory Visit: Payer: No Typology Code available for payment source | Attending: Internal Medicine | Admitting: Occupational Therapy

## 2013-10-10 DIAGNOSIS — M2560 Stiffness of unspecified joint, not elsewhere classified: Secondary | ICD-10-CM | POA: Insufficient documentation

## 2013-10-10 DIAGNOSIS — M6281 Muscle weakness (generalized): Secondary | ICD-10-CM | POA: Insufficient documentation

## 2013-10-10 DIAGNOSIS — R279 Unspecified lack of coordination: Secondary | ICD-10-CM | POA: Insufficient documentation

## 2013-10-10 DIAGNOSIS — M2550 Pain in unspecified joint: Secondary | ICD-10-CM | POA: Insufficient documentation

## 2013-10-10 DIAGNOSIS — IMO0001 Reserved for inherently not codable concepts without codable children: Secondary | ICD-10-CM | POA: Insufficient documentation

## 2013-10-12 ENCOUNTER — Ambulatory Visit: Payer: No Typology Code available for payment source | Admitting: Occupational Therapy

## 2013-10-14 ENCOUNTER — Emergency Department (HOSPITAL_COMMUNITY)
Admission: EM | Admit: 2013-10-14 | Discharge: 2013-10-14 | Disposition: A | Discharge status: Home / Self Care | Payer: No Typology Code available for payment source | Attending: Emergency Medicine | Admitting: Emergency Medicine

## 2013-10-14 ENCOUNTER — Encounter (HOSPITAL_COMMUNITY): Payer: Self-pay | Admitting: Emergency Medicine

## 2013-10-14 DIAGNOSIS — Z79899 Other long term (current) drug therapy: Secondary | ICD-10-CM | POA: Insufficient documentation

## 2013-10-14 DIAGNOSIS — G909 Disorder of the autonomic nervous system, unspecified: Secondary | ICD-10-CM | POA: Insufficient documentation

## 2013-10-14 DIAGNOSIS — T148XXA Other injury of unspecified body region, initial encounter: Secondary | ICD-10-CM

## 2013-10-14 DIAGNOSIS — Y9301 Activity, walking, marching and hiking: Secondary | ICD-10-CM | POA: Insufficient documentation

## 2013-10-14 DIAGNOSIS — K5900 Constipation, unspecified: Secondary | ICD-10-CM | POA: Insufficient documentation

## 2013-10-14 DIAGNOSIS — E1149 Type 2 diabetes mellitus with other diabetic neurological complication: Secondary | ICD-10-CM | POA: Insufficient documentation

## 2013-10-14 DIAGNOSIS — Z7982 Long term (current) use of aspirin: Secondary | ICD-10-CM | POA: Insufficient documentation

## 2013-10-14 DIAGNOSIS — Z8619 Personal history of other infectious and parasitic diseases: Secondary | ICD-10-CM | POA: Insufficient documentation

## 2013-10-14 DIAGNOSIS — Y9289 Other specified places as the place of occurrence of the external cause: Secondary | ICD-10-CM | POA: Insufficient documentation

## 2013-10-14 DIAGNOSIS — Z794 Long term (current) use of insulin: Secondary | ICD-10-CM | POA: Insufficient documentation

## 2013-10-14 DIAGNOSIS — IMO0002 Reserved for concepts with insufficient information to code with codable children: Secondary | ICD-10-CM | POA: Insufficient documentation

## 2013-10-14 DIAGNOSIS — X503XXA Overexertion from repetitive movements, initial encounter: Secondary | ICD-10-CM | POA: Insufficient documentation

## 2013-10-14 DIAGNOSIS — Z8742 Personal history of other diseases of the female genital tract: Secondary | ICD-10-CM | POA: Insufficient documentation

## 2013-10-14 DIAGNOSIS — R609 Edema, unspecified: Secondary | ICD-10-CM | POA: Insufficient documentation

## 2013-10-14 DIAGNOSIS — Z90710 Acquired absence of both cervix and uterus: Secondary | ICD-10-CM | POA: Insufficient documentation

## 2013-10-14 DIAGNOSIS — E785 Hyperlipidemia, unspecified: Secondary | ICD-10-CM | POA: Insufficient documentation

## 2013-10-14 DIAGNOSIS — K219 Gastro-esophageal reflux disease without esophagitis: Secondary | ICD-10-CM | POA: Insufficient documentation

## 2013-10-14 DIAGNOSIS — M545 Low back pain: Secondary | ICD-10-CM

## 2013-10-14 DIAGNOSIS — R110 Nausea: Secondary | ICD-10-CM | POA: Insufficient documentation

## 2013-10-14 MED ORDER — DIAZEPAM 5 MG PO TABS
5.0000 mg | ORAL_TABLET | Freq: Once | ORAL | Status: AC
  Administered 2013-10-14: 5 mg via ORAL
  Filled 2013-10-14: qty 1

## 2013-10-14 MED ORDER — DIAZEPAM 5 MG PO TABS: 5.0000 mg | ORAL_TABLET | Freq: Four times a day (QID) | ORAL | Status: DC | PRN

## 2013-10-14 MED ORDER — OXYCODONE-ACETAMINOPHEN 5-325 MG PO TABS: 2.0000 | ORAL_TABLET | ORAL | Status: DC | PRN

## 2013-10-14 MED ORDER — KETOROLAC TROMETHAMINE 60 MG/2ML IM SOLN
60.0000 mg | Freq: Once | INTRAMUSCULAR | Status: AC
  Administered 2013-10-14: 60 mg via INTRAMUSCULAR
  Filled 2013-10-14: qty 2

## 2013-10-14 NOTE — ED Provider Notes (Signed)
Medical screening examination/treatment/procedure(s) were performed by non-physician practitioner and as supervising physician I was immediately available for consultation/collaboration.  Richarda Blade, MD 10/14/13 [phone]

## 2013-10-14 NOTE — ED Provider Notes (Signed)
CSN: [id number]     Arrival date & time 10/14/13  1308 History   This chart was scribed for non-physician practitioner Elisha Headland, FNP, working with Richarda Blade, MD, by Neta Ehlers, ED Scribe. This patient was seen in room TR11C/TR11C and the patient's care was started at 1:21 PM.  First MD Initiated Contact with Patient 10/14/13 1312     Chief Complaint  Patient presents with  . Back Pain   The history is provided by the patient. No language interpreter was used.   HPI Comments: Nancy [name] is a 57 y.o. female, with a h/o DM and diabetic peripheral neuropathy,  who presents to the Emergency Department complaining of gradually-increasing lower back pain. The pt states she walked more than usual several days ago, and she experienced back pain yesterday which has increased today. She denies a h/o chronic back pain. The pt has attempted to mitigate the pain with heat and hot showers, but without resolution. The pt has also experienced abdominal pain as well as nausea, and she attributes it to the pain medication she had been prescribed for her right hand. She denies any fever, chills, dysuria, difficultly urinating, changes from baseline constipation, emesis, or diarhea.  The pt reports that her DM is better controlled than in the past. She reports her CBGs were between 116 and 130 upon last check. She is a non-smoker.   Past Medical History  Diagnosis Date  . Diabetic peripheral neuropathy   . Carpal tunnel syndrome, bilateral     confirmed on nerve conduction studies  . Hyperlipidemia   . Diabetes mellitus type 2, uncontrolled   . GERD (gastroesophageal reflux disease)   . Heat edema   . History of Helicobacter infection 11/07  . History of chest pain     negative cardiolite September 2007.  ETT-myoview 12/10 with EF 76%, no ischemia,   . History of endometriosis   . Carpal tunnel syndrome    Past Surgical History  Procedure Laterality Date  . Abdominal hysterectomy    .  Carpal tunnel release     Family History  Problem Relation Age of Onset  . Diabetes Mother   . Colon cancer Neg Hx   . Lung cancer Maternal Uncle   . Breast cancer Maternal Grandmother   . Liver cancer Maternal Grandfather    History  Substance Use Topics  . Smoking status: Never Smoker   . Smokeless tobacco: Never Used  . Alcohol Use: No   No OB history provided.  Review of Systems  Constitutional: Negative for fever and chills.  Gastrointestinal: Positive for nausea, abdominal pain and constipation. Negative for vomiting and diarrhea.  Genitourinary: Negative for dysuria and difficulty urinating.  Musculoskeletal: Positive for back pain.  All other systems reviewed and are negative.    Allergies  Amitriptyline hcl  Home Medications   Current Outpatient Rx  Name  Route  Sig  Dispense  Refill  . aspirin 325 MG EC tablet   Oral   Take 325 mg by mouth daily.         Marland Kitchen atorvastatin (LIPITOR) 20 MG tablet   Oral   Take 1 tablet (20 mg total) by mouth daily.   30 tablet   5   . Blood Glucose Monitoring Suppl (BLOOD GLUCOSE METER) kit      Use to check blood sugar up to 3 times a day DX code 250.02 insulin requiring   1 each   0   . esomeprazole (NEXIUM) 40  MG capsule   Oral   Take 1 capsule (40 mg total) by mouth daily.   30 capsule   5   . glucose blood (WAVESENSE PRESTO) test strip      Use to check blood sugar up to 3 times a day DX code 250.02 insulin requiring   100 each   12   . hydrochlorothiazide (HYDRODIURIL) 25 MG tablet   Oral   Take 1 tablet (25 mg total) by mouth daily.   30 tablet   5   . HYDROcodone-acetaminophen (NORCO/VICODIN) 5-325 MG per tablet   Oral   Take 1-2 tablets by mouth every 6 (six) hours as needed for pain.         Marland Kitchen insulin glargine (LANTUS) 100 UNIT/ML injection   Subcutaneous   Inject 0.58 mLs (58 Units total) into the skin every morning. When you wake up   30 mL   3   . Insulin Pen Needle (RELION MINI PEN  NEEDLES) 31G X 6 MM MISC      Use to take insulin injections 3 times a day before meals   100 each   5   . Lancets MISC      Use to check blood sugar 3 times a day   100 each   5   . lisinopril (PRINIVIL,ZESTRIL) 20 MG tablet   Oral   Take 1 tablet (20 mg total) by mouth daily.   30 tablet   11   . metFORMIN (GLUCOPHAGE) 1000 MG tablet   Oral   Take 1 tablet (1,000 mg total) by mouth 2 (two) times daily with a meal.   60 tablet   5   . diazepam (VALIUM) 5 MG tablet   Oral   Take 1 tablet (5 mg total) by mouth every 6 (six) hours as needed for anxiety.   30 tablet   0   . oxyCODONE-acetaminophen (PERCOCET/ROXICET) 5-325 MG per tablet   Oral   Take 2 tablets by mouth every 4 (four) hours as needed for severe pain.   15 tablet   0    Triage Vitals: BP 130/85  Pulse 81  Temp(Src) 97.9 F (36.6 C) (Oral)  Resp 16  Ht 4\' 10"  (1.473 m)  Wt 180 lb (81.647 kg)  BMI 37.63 kg/m2  SpO2 99%  LMP 02/15/2009  Physical Exam  Nursing note and vitals reviewed. Constitutional: She is oriented to person, place, and time. She appears well-developed and well-nourished. No distress.  HENT:  Head: Normocephalic and atraumatic.  Eyes: EOM are normal.  Neck: Neck supple. No tracheal deviation present.  Cardiovascular: Normal rate.   Pulmonary/Chest: Effort normal. No respiratory distress.  Musculoskeletal: Normal range of motion.  Neurological: She is alert and oriented to person, place, and time.  Skin: Skin is warm and dry.  Psychiatric: She has a normal mood and affect. Her behavior is normal.    ED Course  Procedures (including critical care time)  DIAGNOSTIC STUDIES: Oxygen Saturation is 99% on room air, normal by my interpretation.    COORDINATION OF CARE:  1:27 PM- Discussed treatment plan with patient which includes continual usage of heat and gradually-increasing return to normal activities, and the patient agreed to the plan.   Labs Review Labs Reviewed - No  data to display Imaging Review No results found.  EKG Interpretation   None       MDM   1. Muscle strain   2. Low back pain     Low back  pain after doing a lot of walking downtown yesterday. She feels like this is muscle strain. Denies difficulty with urination and having bm's in her normal pattern even though she reports that she has a tendency to be a little constipated. Recommended miralax for her constipation. Toradol and valium given here for her back pain. Continue to use heating pad to her back and rest today. Start back stretching exercises and get moving tomorrow to prevent stiffness.  I personally performed the services described in this documentation, which was scribed in my presence. The recorded information has been reviewed and is accurate.        Elisha Headland, NP 10/14/13 1425

## 2013-10-14 NOTE — ED Notes (Signed)
Pt c/o lower back pain since Wednesday. Onset after walking a far distance. Denies any history of back pain. Took some pain pills she had from an old surgery with no relief, also tried heat and OTC pain creams with no relief. Ambulatory, mae

## 2013-10-16 ENCOUNTER — Ambulatory Visit: Payer: No Typology Code available for payment source | Admitting: Occupational Therapy

## 2013-10-18 ENCOUNTER — Ambulatory Visit: Payer: No Typology Code available for payment source | Admitting: Occupational Therapy

## 2013-10-23 ENCOUNTER — Ambulatory Visit: Payer: No Typology Code available for payment source | Admitting: Occupational Therapy

## 2013-10-25 ENCOUNTER — Encounter: Payer: No Typology Code available for payment source | Admitting: Occupational Therapy

## 2013-10-26 ENCOUNTER — Ambulatory Visit: Payer: No Typology Code available for payment source | Admitting: Occupational Therapy

## 2013-10-27 ENCOUNTER — Telehealth: Payer: Self-pay | Admitting: Dietician

## 2013-10-27 NOTE — Telephone Encounter (Signed)
Out of work, had surgery on arm and hand, other hand is worse, not sure she can go back to work. Cannot afford copays on some medicine and cannot borrow anymore money.  Needs financial assistance. She has spoken to our social work who told her she cannot help her. She has been spreading her pills out and doesn't like having to do this- skipping some days to make it last.  Having trouble affording the following: metformin HCTZ lisinipril  Told her Metformin is free at [employer] with a [place] card. Need to consult her doctor and social work for their inpt. She agreed  llow up with [facility] when she comes to renew her [employer] card if not sooner.

## 2013-10-27 NOTE — Telephone Encounter (Signed)
I see that Nancy [name] has a appointment with financial counselor next week on the 26th, however, if she is not taking her medications or not able to afford them, she will likely need to be seen in [place] before then. Please touch base as I am on nights all this month and have her seen if needed early next week.

## 2013-10-30 NOTE — Telephone Encounter (Signed)
Patient agreed to schedule with Dr. Michail Sermon on 11-01-13 at 9 AM 6o discuss her inability to afford medications.

## 2013-10-30 NOTE — Telephone Encounter (Signed)
Hopefully, Nancy [name] has utilized [facility] ([facility]) during their walk-in hours.  I have fliers with their walk-in hours in my office.  [facility] no longer has funds to assist patients with their medications.

## 2013-10-31 ENCOUNTER — Ambulatory Visit: Payer: No Typology Code available for payment source | Admitting: Occupational Therapy

## 2013-11-01 ENCOUNTER — Encounter: Payer: Self-pay | Admitting: Internal Medicine

## 2013-11-01 ENCOUNTER — Ambulatory Visit: Payer: No Typology Code available for payment source

## 2013-11-01 ENCOUNTER — Ambulatory Visit (INDEPENDENT_AMBULATORY_CARE_PROVIDER_SITE_OTHER): Payer: No Typology Code available for payment source | Admitting: Internal Medicine

## 2013-11-01 VITALS — BP 118/81 | HR 90 | Temp 97.3°F | Ht 58.25 in | Wt 185.1 lb

## 2013-11-01 DIAGNOSIS — Z596 Low income: Secondary | ICD-10-CM | POA: Insufficient documentation

## 2013-11-01 DIAGNOSIS — Z860100 Personal history of colon polyps, unspecified: Secondary | ICD-10-CM

## 2013-11-01 DIAGNOSIS — I10 Essential (primary) hypertension: Secondary | ICD-10-CM

## 2013-11-01 DIAGNOSIS — E1149 Type 2 diabetes mellitus with other diabetic neurological complication: Secondary | ICD-10-CM

## 2013-11-01 DIAGNOSIS — E785 Hyperlipidemia, unspecified: Secondary | ICD-10-CM

## 2013-11-01 DIAGNOSIS — Z79899 Other long term (current) drug therapy: Secondary | ICD-10-CM

## 2013-11-01 DIAGNOSIS — E119 Type 2 diabetes mellitus without complications: Secondary | ICD-10-CM

## 2013-11-01 DIAGNOSIS — G5600 Carpal tunnel syndrome, unspecified upper limb: Secondary | ICD-10-CM

## 2013-11-01 DIAGNOSIS — Z59868 Other specified financial insecurity: Secondary | ICD-10-CM

## 2013-11-01 DIAGNOSIS — R109 Unspecified abdominal pain: Secondary | ICD-10-CM

## 2013-11-01 DIAGNOSIS — G5603 Carpal tunnel syndrome, bilateral upper limbs: Secondary | ICD-10-CM

## 2013-11-01 DIAGNOSIS — G5601 Carpal tunnel syndrome, right upper limb: Secondary | ICD-10-CM

## 2013-11-01 DIAGNOSIS — Z598 Other problems related to housing and economic circumstances: Secondary | ICD-10-CM

## 2013-11-01 LAB — LIPID PANEL
Cholesterol: 125 mg/dL (ref 0–200)
HDL: 45 mg/dL
LDL Cholesterol: 53 mg/dL (ref 0–99)
Total CHOL/HDL Ratio: 2.8 ratio
Triglycerides: 137 mg/dL
VLDL: 27 mg/dL (ref 0–40)

## 2013-11-01 LAB — POCT GLYCOSYLATED HEMOGLOBIN (HGB A1C): Hemoglobin A1C: 8.3

## 2013-11-01 LAB — GLUCOSE, CAPILLARY: Glucose-Capillary: 206 mg/dL — ABNORMAL HIGH (ref 70–99)

## 2013-11-01 MED ORDER — METFORMIN HCL 1000 MG PO TABS: 1000.0000 mg | ORAL_TABLET | Freq: Two times a day (BID) | ORAL | Status: DC

## 2013-11-01 NOTE — Patient Instructions (Addendum)
Today you will meet with Nancy [name] to renew your [employer]. Unfortunately you are on the lowest price medicines that we have available. We can consider doubling the dose of the lisinopril and cutting it in half to make it more affordable. Follow-up with your PCP.

## 2013-11-01 NOTE — Assessment & Plan Note (Addendum)
Current medications which she is running very low on are HCTZ, Metformin, and lisinopril ALL of which are on the $4 plan. She [facility] Nexium and [facility]antus [facility] -when she is able to get money consider doubling dose and halving the medication vs prescribing 3 month supply at a time if allowed by [facility] card

## 2013-11-01 NOTE — Progress Notes (Signed)
Case discussed with Dr. Schooler at the time of the visit.  We reviewed the resident's history and exam and pertinent patient test results.  I agree with the assessment, diagnosis, and plan of care documented in the resident's note.     

## 2013-11-01 NOTE — Progress Notes (Signed)
  Subjective:    Patient ID: Nancy [name], female    DOB: September 08, 1956, 57 y.o.   MRN: [id number]  HPI  Presents for assistance with obtaining medications for diabetes and hypertension.  States that she cannot afford the $4 for her lisinopril, HCTZ and metformin.  Has three pills left of each.  States that she has been "spreading them out to last longer". Denies chest pain, shortness of breath. Reports that her wrist is a bit swollen from carpall tunnel surgery in September.  Evaluated yesterday by her surgeon.  Otherwise without complaints.  Review of Systems  Constitutional: Negative.   HENT: Negative.   Eyes: Negative.   Respiratory: Negative.   Cardiovascular: Negative.   Gastrointestinal: Negative.   Endocrine: Negative.   Genitourinary: Negative.   Musculoskeletal: Positive for joint swelling.       Previous right carpal tunnel surgery  Skin: Negative.   Allergic/Immunologic: Negative.   Neurological: Negative.   Hematological: Negative.   Psychiatric/Behavioral: Negative.        Objective:   Physical Exam  Constitutional: She is oriented to person, place, and time. She appears well-developed and well-nourished. No distress.  Pleasant AA female with mother present  HENT:  Head: Normocephalic and atraumatic.  Eyes: Conjunctivae are normal. Pupils are equal, round, and reactive to light.  Cardiovascular: Normal rate.   Pulmonary/Chest: Effort normal.  Neurological: She is alert and oriented to person, place, and time.  Psychiatric: She has a normal mood and affect. Her behavior is normal. Judgment and thought content normal.          Assessment & Plan:  See separate problem list chart:  #1 inability to afford medicines: pt to meet with financial counselor today,  Meds that she needs are on the $4 plan which is the best that we can do in the clinic -consider writing for 3 month supply if available for $6 on [facility] list  #2 bilateral carpal tunnel: s/p surgery to  right wrist -followed by Surgeon with evaluation yesterday

## 2013-11-01 NOTE — Assessment & Plan Note (Signed)
S/p Surgery on right carpal tunnel, evaluated by Surgeon yesterday 10/31/2013

## 2013-11-06 ENCOUNTER — Ambulatory Visit: Payer: Self-pay

## 2013-11-08 ENCOUNTER — Other Ambulatory Visit: Payer: Self-pay | Admitting: *Deleted

## 2013-11-08 DIAGNOSIS — R109 Unspecified abdominal pain: Secondary | ICD-10-CM

## 2013-11-08 DIAGNOSIS — G5601 Carpal tunnel syndrome, right upper limb: Secondary | ICD-10-CM

## 2013-11-08 DIAGNOSIS — E119 Type 2 diabetes mellitus without complications: Secondary | ICD-10-CM

## 2013-11-08 DIAGNOSIS — E785 Hyperlipidemia, unspecified: Secondary | ICD-10-CM

## 2013-11-08 DIAGNOSIS — Z86010 Personal history of colonic polyps: Secondary | ICD-10-CM

## 2013-11-08 DIAGNOSIS — E1149 Type 2 diabetes mellitus with other diabetic neurological complication: Secondary | ICD-10-CM

## 2013-11-08 DIAGNOSIS — I10 Essential (primary) hypertension: Secondary | ICD-10-CM

## 2013-11-09 ENCOUNTER — Other Ambulatory Visit: Payer: Self-pay | Admitting: *Deleted

## 2013-11-09 NOTE — Telephone Encounter (Signed)
Pt wanted these meds transferred from [facility]. [facility] called and will transfer meds to there pharmacy.

## 2013-11-15 ENCOUNTER — Telehealth: Payer: Self-pay | Admitting: *Deleted

## 2013-11-15 NOTE — Telephone Encounter (Signed)
Meds have been refilled.

## 2013-11-15 NOTE — Telephone Encounter (Signed)
[facility] can no longer get lipitor at no charge.  Would you consider changing to Crestor, no charge for pt.

## 2013-11-16 MED ORDER — ROSUVASTATIN CALCIUM 10 MG PO TABS: 10.0000 mg | ORAL_TABLET | Freq: Every day | ORAL | Status: DC

## 2013-11-16 NOTE — Telephone Encounter (Signed)
Yes that is fine, we can do crestor 5 or 10mg  which ever the pharmacy has. If cost is a major issue for patient, her last lipid panel looked good and we can try just lifestyle modifications for lipid control instead of statin. Please let me know what she prefers.

## 2013-11-16 NOTE — Telephone Encounter (Signed)
There will be no charge for the Crestor, please enter in EPIC as they need a new Rx.

## 2013-11-17 NOTE — Telephone Encounter (Signed)
Rx called in 

## 2013-12-25 ENCOUNTER — Other Ambulatory Visit: Payer: Self-pay

## 2013-12-25 DIAGNOSIS — Z1231 Encounter for screening mammogram for malignant neoplasm of breast: Secondary | ICD-10-CM

## 2014-01-16 ENCOUNTER — Ambulatory Visit (INDEPENDENT_AMBULATORY_CARE_PROVIDER_SITE_OTHER): Payer: No Typology Code available for payment source | Admitting: Internal Medicine

## 2014-01-16 ENCOUNTER — Encounter: Payer: Self-pay | Admitting: Internal Medicine

## 2014-01-16 VITALS — BP 145/94 | HR 82 | Temp 98.0°F | Ht 58.25 in | Wt 185.3 lb

## 2014-01-16 DIAGNOSIS — H1013 Acute atopic conjunctivitis, bilateral: Secondary | ICD-10-CM

## 2014-01-16 DIAGNOSIS — K219 Gastro-esophageal reflux disease without esophagitis: Secondary | ICD-10-CM

## 2014-01-16 DIAGNOSIS — R0789 Other chest pain: Secondary | ICD-10-CM | POA: Insufficient documentation

## 2014-01-16 DIAGNOSIS — E119 Type 2 diabetes mellitus without complications: Secondary | ICD-10-CM

## 2014-01-16 DIAGNOSIS — I10 Essential (primary) hypertension: Secondary | ICD-10-CM

## 2014-01-16 DIAGNOSIS — E669 Obesity, unspecified: Secondary | ICD-10-CM | POA: Insufficient documentation

## 2014-01-16 DIAGNOSIS — H1045 Other chronic allergic conjunctivitis: Secondary | ICD-10-CM

## 2014-01-16 DIAGNOSIS — G5603 Carpal tunnel syndrome, bilateral upper limbs: Secondary | ICD-10-CM

## 2014-01-16 DIAGNOSIS — G5600 Carpal tunnel syndrome, unspecified upper limb: Secondary | ICD-10-CM

## 2014-01-16 DIAGNOSIS — E785 Hyperlipidemia, unspecified: Secondary | ICD-10-CM

## 2014-01-16 DIAGNOSIS — D126 Benign neoplasm of colon, unspecified: Secondary | ICD-10-CM

## 2014-01-16 DIAGNOSIS — T677XXA Heat edema, initial encounter: Secondary | ICD-10-CM | POA: Insufficient documentation

## 2014-01-16 DIAGNOSIS — A048 Other specified bacterial intestinal infections: Secondary | ICD-10-CM | POA: Insufficient documentation

## 2014-01-16 DIAGNOSIS — J302 Other seasonal allergic rhinitis: Secondary | ICD-10-CM | POA: Insufficient documentation

## 2014-01-16 LAB — GLUCOSE, CAPILLARY: GLUCOSE-CAPILLARY: 131 mg/dL — AB (ref 70–99)

## 2014-01-16 LAB — POCT GLYCOSYLATED HEMOGLOBIN (HGB A1C): HEMOGLOBIN A1C: 7.6

## 2014-01-16 MED ORDER — LISINOPRIL 20 MG PO TABS: 20.0000 mg | ORAL_TABLET | Freq: Every day | ORAL | Status: DC

## 2014-01-16 MED ORDER — INSULIN GLARGINE 100 UNIT/ML ~~LOC~~ SOLN: 58.0000 [IU] | SUBCUTANEOUS | Status: DC

## 2014-01-16 MED ORDER — HYDROCHLOROTHIAZIDE 25 MG PO TABS: 25.0000 mg | ORAL_TABLET | Freq: Every day | ORAL | Status: DC

## 2014-01-16 MED ORDER — ESOMEPRAZOLE MAGNESIUM 40 MG PO CPDR: 40.0000 mg | DELAYED_RELEASE_CAPSULE | Freq: Every day | ORAL | Status: DC

## 2014-01-16 MED ORDER — METFORMIN HCL 1000 MG PO TABS: 1000.0000 mg | ORAL_TABLET | Freq: Two times a day (BID) | ORAL | Status: DC

## 2014-01-16 NOTE — Assessment & Plan Note (Signed)
Referral with Dr. Ardis Hughs for 5 year screening today

## 2014-01-16 NOTE — Assessment & Plan Note (Signed)
Following up at [facility]. No improvement since September surgery. B/l hand pain continues. Is also seeing [facility].   -follow [facility] in [facility] weeks at [facility], will request records if not available. May need to return to neurology?

## 2014-01-16 NOTE — Assessment & Plan Note (Signed)
Refilled crestor 

## 2014-01-16 NOTE — Progress Notes (Signed)
Subjective:   Patient ID: Nancy [name] female   DOB: 11-12-1956 58 y.o.   MRN: [id number]  HPI: [name] is a 58 y.o. female with PMH DM2, HTN, HL, and carpal tunnel s/p surgery R wrist (September 2014) presenting to [facility] today for a follow-up visit alongside her mother today.    She says no improvement in her right hand after surgery and has not been able to work since then August 09 2013.  She is following up at [facility] with Dr. Nicoletta Dress and Dr. Hollie Salk she claims and has an upcoming appointment on 01/31/14.  She says she does not have much pain at rest, but cannot move her hands much and cannot grab anything. Her mother is helping her with all of these things.  She endorses swelling and pain on her right hand since surgery.  We will need to review records.  Her diabetes is improving, A1C down to 7's today and weight is stable but she is working on hopefully weight loss.  She would like to return to Dr. Eugenia Pancoast for 5 year follow up colonoscopy for tubular adenoma found in 2010, has mammogram scheduled for later this month and also wishes to have an eye referral for vision check, eye itching, and diabetes.   Past Medical History  Diagnosis Date  . Diabetic peripheral neuropathy   . Carpal tunnel syndrome, bilateral     confirmed on nerve conduction studies  . Hyperlipidemia   . Diabetes mellitus type 2, uncontrolled   . GERD (gastroesophageal reflux disease)   . Heat edema   . History of Helicobacter infection 11/07  . History of chest pain     negative cardiolite September 2007.  ETT-myoview 12/10 with EF 76%, no ischemia,   . History of endometriosis   . Carpal tunnel syndrome    Current Outpatient Prescriptions  Medication Sig Dispense Refill  . aspirin 325 MG EC tablet Take 325 mg by mouth daily.      . Blood Glucose Monitoring Suppl (BLOOD GLUCOSE METER) kit Use to check blood sugar up to 3 times a day DX code 250.02 insulin requiring  1 each  0  . diazepam  (VALIUM) 5 MG tablet Take 1 tablet (5 mg total) by mouth every 6 (six) hours as needed for anxiety.  30 tablet  0  . esomeprazole (NEXIUM) 40 MG capsule Take 1 capsule (40 mg total) by mouth daily.  30 capsule  5  . glucose blood (WAVESENSE PRESTO) test strip Use to check blood sugar up to 3 times a day DX code 250.02 insulin requiring  100 each  12  . hydrochlorothiazide (HYDRODIURIL) 25 MG tablet Take 1 tablet (25 mg total) by mouth daily.  30 tablet  5  . HYDROcodone-acetaminophen (NORCO/VICODIN) 5-325 MG per tablet Take 1-2 tablets by mouth every 6 (six) hours as needed for pain.      Marland Kitchen insulin glargine (LANTUS) 100 UNIT/ML injection Inject 0.58 mLs (58 Units total) into the skin every morning. When you wake up  30 mL  3  . Insulin Pen Needle (RELION MINI PEN NEEDLES) 31G X 6 MM MISC Use to take insulin injections 3 times a day before meals  100 each  5  . Lancets MISC Use to check blood sugar 3 times a day  100 each  5  . lisinopril (PRINIVIL,ZESTRIL) 20 MG tablet Take 1 tablet (20 mg total) by mouth daily.  30 tablet  11  . metFORMIN (GLUCOPHAGE) 1000 MG tablet Take  1 tablet (1,000 mg total) by mouth 2 (two) times daily with a meal.  60 tablet  5  . oxyCODONE-acetaminophen (PERCOCET/ROXICET) 5-325 MG per tablet Take 2 tablets by mouth every 4 (four) hours as needed for severe pain.  15 tablet  0  . rosuvastatin (CRESTOR) 10 MG tablet Take 1 tablet (10 mg total) by mouth at bedtime.  30 tablet  11   No current facility-administered medications for this visit.   Family History  Problem Relation Age of Onset  . Diabetes Mother   . Colon cancer Neg Hx   . Lung cancer Maternal Uncle   . Breast cancer Maternal Grandmother   . Liver cancer Maternal Grandfather    History   Social History  . Marital Status: Single    Spouse Name: N/A    Number of Children: 0  . Years of Education: 11   Occupational History  . CNA    Social History Main Topics  . Smoking status: Never Smoker   .  Smokeless tobacco: Never Used  . Alcohol Use: No  . Drug Use: No  . Sexual Activity: None   Other Topics Concern  . None   Social History Narrative   Financial assistance approved for 100% discount at [facility] and has [facility] card per [employer]   09/26/2010   Review of Systems:  Constitutional:  Denies fever, chills, diaphoresis, appetite change and fatigue.   HEENT:  Denies congestion  Respiratory:  Occasional dry cough. Denies SOB, DOE, and wheezing.   Cardiovascular:  Denies chest pain  Gastrointestinal:  Denies nausea, vomiting, abdominal pain   Genitourinary:  Denies dysuria   Musculoskeletal:  B/l carpal tunnel and hand pain  Skin:  Denies pallor, rash and wound.   Neurological:  Denies headaches.    Objective:  Physical Exam: Filed Vitals:   01/16/14 1337  BP: 145/94  Pulse: 82  Temp: 98 F (36.7 C)  TempSrc: Oral  Height: 4' 10.25" (1.48 m)  Weight: 185 lb 4.8 oz (84.052 kg)  SpO2: 100%   Vitals reviewed. General: sitting in chair, NAD HEENT: PERRL, EOMI Cardiac: RRR Pulm: clear to auscultation bilaterally, no wheezes Abd: soft, obese, nondistended, BS present Ext: warm and well perfused, no pedal edema, +2DP B/L, mild swelling of right knuckle, tender to palpation of right hand, unable to make b/l fists Neuro: alert and oriented X3, cranial nerves II-XII grossly intact, decreased strength b/l upper extremities due to pain, sensation intact   Assessment & Plan:  Discussed with Dr. Ellwood Dense F/u [facility] for hands Gi referral for 5 year colonoscopy Optho referral  Meds refilled

## 2014-01-16 NOTE — Assessment & Plan Note (Signed)
Lab Results  Component Value Date   HGBA1C 7.6 01/16/2014   HGBA1C 8.3 11/01/2013   HGBA1C 9.4 06/27/2013    Assessment: Diabetes control: good control (HgbA1C at goal) Progress toward A1C goal:  improved  Plan: Medications:  continue current medications lantus 58 units, metformin 1000mg  bid Home glucose monitoring: Frequency:   Timing: before meals Instruction/counseling given: reminded to get eye exam, reminded to bring blood glucose meter & log to each visit, reminded to bring medications to each visit, discussed foot care, discussed the need for weight loss and discussed diet

## 2014-01-16 NOTE — Assessment & Plan Note (Deleted)
Refilled nexium today

## 2014-01-16 NOTE — Patient Instructions (Addendum)
General Instructions:  Please follow up with your eye doctor  Please follow up for colonoscopy and mammogram as scheduled  Please bring your meter to next visit and follow up with your [facility] appointments for your hands  Continue to work on weight loss and your blood sugars  Follow up 3-6 months  Treatment Goals:  Goals (1 Years of Data) as of 01/16/14         As of Today 11/01/13 10/14/13 09/05/13 08/21/13     Blood Pressure    . Blood Pressure < 130/80  145/94 118/81 130/85 140/92 130/87     Result Component    . HEMOGLOBIN A1C < 7.0  7.6 8.3       . LDL CALC < 100   53         Progress Toward Treatment Goals:  Treatment Goal 01/16/2014  Hemoglobin A1C improved  Blood pressure deteriorated    Self Care Goals & Plans:  Self Care Goal 01/16/2014  Manage my medications take my medicines as prescribed; bring my medications to every visit  Monitor my health keep track of my blood glucose; bring my glucose meter and log to each visit  Eat healthy foods eat more vegetables; eat foods that are low in salt; eat baked foods instead of fried foods; eat smaller portions  Be physically active find an activity I enjoy; take a walk every day    Home Blood Glucose Monitoring 01/16/2014  Check my blood sugar -  When to check my blood sugar before meals     Care Management & Community Referrals:  Referral 05/09/2013  Referrals made for care management support none needed

## 2014-01-16 NOTE — Assessment & Plan Note (Addendum)
Reports b/l eye itching at times. Also wishes to see optho for diabetic eye exam and vision screening--may need new prescription for glasses  -recommended otc claritin to see if that may help with itching, if no relief, may benefit from topical

## 2014-01-16 NOTE — Assessment & Plan Note (Signed)
Refilled nexium today

## 2014-01-16 NOTE — Assessment & Plan Note (Signed)
BP Readings from Last 3 Encounters:  01/16/14 145/94  11/01/13 118/81  10/14/13 130/85   Lab Results  Component Value Date   NA 135 08/21/2013   K 3.7 08/21/2013   CREATININE 0.69 08/21/2013   Assessment: Blood pressure control: mildly elevated Progress toward BP goal:  deteriorated Comments: likely secondary to stress and pain  Plan: Medications:  continue current medications lisinopril 20mg  qd, hctz 25mg ; refilled today Educational resources provided:   Self management tools provided:   Other plans: reports occasional dry cough, will continue to monitor, if worsening or persistent may need to take off ACEI

## 2014-01-17 NOTE — Progress Notes (Signed)
Case discussed with Dr. Qureshi at the time of the visit.  We reviewed the resident's history and exam and pertinent patient test results.  I agree with the assessment, diagnosis, and plan of care documented in the resident's note. 

## 2014-01-18 ENCOUNTER — Encounter: Payer: Self-pay | Admitting: Gastroenterology

## 2014-01-29 ENCOUNTER — Ambulatory Visit
Admission: RE | Admit: 2014-01-29 | Discharge: 2014-01-29 | Disposition: A | Discharge status: Home / Self Care | Payer: No Typology Code available for payment source | Source: Ambulatory Visit

## 2014-01-29 DIAGNOSIS — Z1231 Encounter for screening mammogram for malignant neoplasm of breast: Secondary | ICD-10-CM

## 2014-02-20 ENCOUNTER — Ambulatory Visit: Payer: No Typology Code available for payment source | Admitting: Gastroenterology

## 2014-03-28 ENCOUNTER — Ambulatory Visit (INDEPENDENT_AMBULATORY_CARE_PROVIDER_SITE_OTHER): Payer: Self-pay | Admitting: Gastroenterology

## 2014-03-28 ENCOUNTER — Encounter: Payer: Self-pay | Admitting: Gastroenterology

## 2014-03-28 VITALS — BP 138/86 | HR 78 | Ht 59.0 in | Wt 184.0 lb

## 2014-03-28 DIAGNOSIS — Z86010 Personal history of colonic polyps: Secondary | ICD-10-CM

## 2014-03-28 MED ORDER — MOVIPREP 100 G PO SOLR: 1.0000 | Freq: Once | ORAL | Status: DC

## 2014-03-28 NOTE — Progress Notes (Signed)
Review of pertinent gastrointestinal problems:  1. Adenomatous polyp, 12/2008 colonoscopy done for fob+ stool, recall set for 5 year interval  2. Mucous discharge, abd pain "cloth like" substance coming out of her bottom. She submitted a specimen, pathology said it was a "strand of mucus". Flex sig Dr. Ardis Hughs 2012 was normal.  HPI: This is a  very pleasant 58 year old woman whom I seeing for the first time in about 3 years   Has been doing well.  Mother diagnosed with lung cancer.  She is here to discuss repeat colonoscopy for polyp surveillance  She has intermittent, very dense stools.  HAs to push and strain at times.  She doesn't like fiber supplements.  Review of systems: Pertinent positive and negative review of systems were noted in the above HPI section. Complete review of systems was performed and was otherwise normal.    Past Medical History  Diagnosis Date  . Diabetic peripheral neuropathy   . Carpal tunnel syndrome, bilateral     confirmed on nerve conduction studies  . Hyperlipidemia   . Diabetes mellitus type 2, uncontrolled   . GERD (gastroesophageal reflux disease)   . Heat edema   . History of Helicobacter infection 11/07  . History of chest pain     negative cardiolite September 2007.  ETT-myoview 12/10 with EF 76%, no ischemia,   . History of endometriosis   . Carpal tunnel syndrome     Past Surgical History  Procedure Laterality Date  . Abdominal hysterectomy    . Carpal tunnel release      Current Outpatient Prescriptions  Medication Sig Dispense Refill  . aspirin 325 MG EC tablet Take 325 mg by mouth daily.      . Blood Glucose Monitoring Suppl (BLOOD GLUCOSE METER) kit Use to check blood sugar up to 3 times a day DX code 250.02 insulin requiring  1 each  0  . esomeprazole (NEXIUM) 40 MG capsule Take 1 capsule (40 mg total) by mouth daily.  30 capsule  3  . glucose blood (WAVESENSE PRESTO) test strip Use to check blood sugar up to 3 times a day DX code  250.02 insulin requiring  100 each  12  . hydrochlorothiazide (HYDRODIURIL) 25 MG tablet Take 1 tablet (25 mg total) by mouth daily.  30 tablet  11  . insulin glargine (LANTUS) 100 UNIT/ML injection Inject 0.58 mLs (58 Units total) into the skin every morning. When you wake up  30 mL  10  . Lancets MISC Use to check blood sugar 3 times a day  100 each  5  . lisinopril (PRINIVIL,ZESTRIL) 20 MG tablet Take 1 tablet (20 mg total) by mouth daily.  30 tablet  11  . metFORMIN (GLUCOPHAGE) 1000 MG tablet Take 1 tablet (1,000 mg total) by mouth 2 (two) times daily with a meal.  60 tablet  11  . rosuvastatin (CRESTOR) 10 MG tablet Take 1 tablet (10 mg total) by mouth at bedtime.  30 tablet  11   No current facility-administered medications for this visit.    Allergies as of 03/28/2014 - Review Complete 03/28/2014  Allergen Reaction Noted  . Amitriptyline hcl Other (See Comments) 12/13/2006    Family History  Problem Relation Age of Onset  . Diabetes Mother   . Colon cancer Neg Hx   . Lung cancer Maternal Uncle   . Breast cancer Maternal Grandmother   . Liver cancer Maternal Grandfather     History   Social History  . Marital  Status: Single    Spouse Name: N/A    Number of Children: 0  . Years of Education: 11   Occupational History  . CNA    Social History Main Topics  . Smoking status: Never Smoker   . Smokeless tobacco: Never Used  . Alcohol Use: No  . Drug Use: No  . Sexual Activity: Not on file   Other Topics Concern  . Not on file   Social History Narrative   Financial assistance approved for 100% discount at [facility] and has [facility] card per [employer]   09/26/2010       Physical Exam: LMP 02/15/2009 Constitutional: generally well-appearing Psychiatric: alert and oriented x3 Eyes: extraocular movements intact Mouth: oral pharynx moist, no lesions Neck: supple no lymphadenopathy Cardiovascular: heart regular rate and rhythm Lungs: clear to auscultation  bilaterally Abdomen: soft, nontender, nondistended, no obvious ascites, no peritoneal signs, normal bowel sounds Extremities: no lower extremity edema bilaterally Skin: no lesions on visible extremities    Assessment and plan: 58 y.o. female with  personal history of adenomatous colon polyps, mild intermittent constipation  She needs repeat colonoscopy for polyp surveillance. I recommended she try fiber supplements on a daily basis for her intermittent straining, difficulty to move her bowels. She now knows that the side effects of bloating usually get better after a week or so.

## 2014-03-28 NOTE — Patient Instructions (Signed)
You will be set up for a colonoscopy for polyp surveillance. Please start taking citrucel (orange flavored) powder fiber supplement.  This may cause some bloating at first but that usually goes away. Begin with a small spoonful and work your way up to a large, heaping spoonful daily over a week.

## 2014-04-02 ENCOUNTER — Encounter: Payer: Self-pay | Admitting: Gastroenterology

## 2014-04-02 ENCOUNTER — Ambulatory Visit (AMBULATORY_SURGERY_CENTER): Payer: Self-pay | Admitting: Gastroenterology

## 2014-04-02 VITALS — BP 130/81 | HR 63 | Temp 96.2°F | Resp 13 | Ht 59.0 in | Wt 184.0 lb

## 2014-04-02 DIAGNOSIS — Z86010 Personal history of colonic polyps: Secondary | ICD-10-CM

## 2014-04-02 LAB — GLUCOSE, CAPILLARY
GLUCOSE-CAPILLARY: 190 mg/dL — AB (ref 70–99)
Glucose-Capillary: 140 mg/dL — ABNORMAL HIGH (ref 70–99)

## 2014-04-02 MED ORDER — SODIUM CHLORIDE 0.9 % IV SOLN: 500.0000 mL | INTRAVENOUS | Status: DC

## 2014-04-02 NOTE — Patient Instructions (Signed)
YOU HAD AN ENDOSCOPIC PROCEDURE TODAY AT THE [facility]: Refer to the procedure report that was given to you for any specific questions about what was found during the examination.  If the procedure report does not answer your questions, please call your gastroenterologist to clarify.  If you requested that your care partner not be given the details of your procedure findings, then the procedure report has been included in a sealed envelope for you to review at your convenience later.  YOU SHOULD EXPECT: Some feelings of bloating in the abdomen. Passage of more gas than usual.  Walking can help get rid of the air that was put into your GI tract during the procedure and reduce the bloating. If you had a lower endoscopy (such as a colonoscopy or flexible sigmoidoscopy) you may notice spotting of blood in your stool or on the toilet paper. If you underwent a bowel prep for your procedure, then you may not have a normal bowel movement for a few days.  DIET: Your first meal following the procedure should be a light meal and then it is ok to progress to your normal diet.  A half-sandwich or bowl of soup is an example of a good first meal.  Heavy or fried foods are harder to digest and may make you feel nauseous or bloated.  Likewise meals heavy in dairy and vegetables can cause extra gas to form and this can also increase the bloating.  Drink plenty of fluids but you should avoid alcoholic beverages for 24 hours.  ACTIVITY: Your care partner should take you home directly after the procedure.  You should plan to take it easy, moving slowly for the rest of the day.  You can resume normal activity the day after the procedure however you should NOT DRIVE or use heavy machinery for 24 hours (because of the sedation medicines used during the test).    SYMPTOMS TO REPORT IMMEDIATELY: A gastroenterologist can be reached at any hour.  During normal business hours, 8:30 AM to 5:00 PM Monday through Friday,  call [phone].  After hours and on weekends, please call the GI answering service at [phone] who will take a message and have the physician on call contact you.   Following lower endoscopy (colonoscopy or flexible sigmoidoscopy):  Excessive amounts of blood in the stool  Significant tenderness or worsening of abdominal pains  Swelling of the abdomen that is new, acute  Fever of 100F or higher  FOLLOW UP: If any biopsies were taken you will be contacted by phone or by letter within the next 1-3 weeks.  Call your gastroenterologist if you have not heard about the biopsies in 3 weeks.  Our staff will call the home number listed on your records the next business day following your procedure to check on you and address any questions or concerns that you may have at that time regarding the information given to you following your procedure. This is a courtesy call and so if there is no answer at the home number and we have not heard from you through the emergency physician on call, we will assume that you have returned to your regular daily activities without incident.  SIGNATURES/CONFIDENTIALITY: You and/or your care partner have signed paperwork which will be entered into your electronic medical record.  These signatures attest to the fact that that the information above on your After Visit Summary has been reviewed and is understood.  Full responsibility of the confidentiality of this   discharge information lies with you and/or your care-partner.  Resume medications. 

## 2014-04-02 NOTE — Progress Notes (Signed)
Report to pacu rn, vss, bbs=clear 

## 2014-04-02 NOTE — Op Note (Signed)
[facility]  [address] [place], [id number]   COLONOSCOPY PROCEDURE REPORT  PATIENT: Nancy [name], Nancy [name]  MR#: [id number] BIRTHDATE: 1956/04/01 , 57  yrs. old GENDER: Female ENDOSCOPIST: Milus Banister, MD PROCEDURE DATE:  04/02/2014 PROCEDURE:   Colonoscopy, surveillance First Screening Colonoscopy - Avg.  risk and is 50 yrs.  old or older - No.  Prior Negative Screening - Now for repeat screening. N/A  History of Adenoma - Now for follow-up colonoscopy & has been > or = to 3 yrs.  Yes hx of adenoma.  Has been 3 or more years since last colonoscopy.  Polyps Removed Today? No.  Recommend repeat exam, <10 yrs? No. ASA CLASS:   Class II INDICATIONS: 53mm adenomatous polyp, 12/2008 colonoscopy done for fob+ stool, recall set for 5 year interval. MEDICATIONS: MAC sedation, administered by CRNA and propofol (Diprivan) 200mg  IV  DESCRIPTION OF PROCEDURE:   After the risks benefits and alternatives of the procedure were thoroughly explained, informed consent was obtained.  A digital rectal exam revealed no abnormalities of the rectum.   The LB [id number]  endoscope was introduced through the anus and advanced to the cecum, which was identified by both the appendix and ileocecal valve. No adverse events experienced.   The quality of the prep was excellent.  The instrument was then slowly withdrawn as the colon was fully examined.   COLON FINDINGS: A normal appearing cecum, ileocecal valve, and appendiceal orifice were identified.  The ascending, hepatic flexure, transverse, splenic flexure, descending, sigmoid colon and rectum appeared unremarkable.  No polyps or cancers were seen. Retroflexed views revealed no abnormalities. The time to cecum=1 minutes 31 seconds.  Withdrawal time=8 minutes 15 seconds.  The scope was withdrawn and the procedure completed. COMPLICATIONS: There were no complications.  ENDOSCOPIC IMPRESSION: Normal colon No polyps or  cancers  RECOMMENDATIONS: You should continue to follow colorectal cancer screening guidelines for "routine risk" patients with a repeat colonoscopy in 10 years.   eSigned:  Milus Banister, MD 04/02/2014 2:49 PM

## 2014-04-03 ENCOUNTER — Telehealth: Payer: Self-pay

## 2014-04-03 NOTE — Telephone Encounter (Signed)
  Follow up Call-  Call back number 04/02/2014 08/12/2011  Post procedure Call Back phone  # [phone]  Permission to leave phone message Yes -     Patient questions:  Do you have a fever, pain , or abdominal swelling? no Pain Score  0 *  Have you tolerated food without any problems? yes  Have you been able to return to your normal activities? yes  Do you have any questions about your discharge instructions: Diet   no Medications  no Follow up visit  no  Do you have questions or concerns about your Care? no  Actions: * If pain score is 4 or above: No action needed, pain <4.

## 2014-04-03 NOTE — Telephone Encounter (Signed)
  Follow up Call-  Call back number 04/02/2014 08/12/2011  Post procedure Call Back phone  # [phone]  Permission to leave phone message Yes -     Patient questions:  Do you have a fever, pain , or abdominal swelling? no Pain Score  0 *  Have you tolerated food without any problems? yes  Have you been able to return to your normal activities? yes  Do you have any questions about your discharge instructions: Diet   no Medications  no Follow up visit  yes  Do you have questions or concerns about your Care? no  Actions: * If pain score is 4 or above: No action needed, pain <4.

## 2014-05-07 ENCOUNTER — Ambulatory Visit: Payer: Medicaid Other

## 2014-05-07 ENCOUNTER — Encounter: Payer: Self-pay | Admitting: Licensed Clinical Social Worker

## 2014-05-08 ENCOUNTER — Telehealth: Payer: Self-pay | Admitting: Internal Medicine

## 2014-05-08 NOTE — Telephone Encounter (Signed)
I called and spoke with Nancy [name] this evening.  She will call the eye center to ask when she was schedule for follow up. She admits she may be mistaking glaucoma for cataract. She will also call dentists in the area who will take her insurance. opc Lela also mailed a list of providers who make take her insurance.   She will still plan to come on Friday due to her persistent dry cough.  This is likely to her ACEi, and we will recheck her BP and likely transition to ARB.

## 2014-05-08 NOTE — Progress Notes (Signed)
Nancy [name] was referred to [facility] by front office staff inquiring if pt needed to update medicaid card to reflect PCP.  Currently, Nancy [name] has straight medicaid and does not need to designate a PCP.  Nancy [name] inquired with CSW if need for dental referral or opthalmology was required.  Discussed with office staff, pt does not require referral but would be in pt's best interest to obtain referral to confirm physician accepts pt's medicaid insurance.  CSW placed call to Nancy [name] on 6/2 and notified of above.  Pt states she is concerned regarding last eye visit, pt states she was told she has glaucoma and need to follow closely.  Nancy [name] states her current dentures are broken but she is still wearing them.  Pt states she did not address dental issues with her PCP during last visit and is not due for follow up until August.  CSW will forward pt's concerns to PCP.

## 2014-05-11 ENCOUNTER — Ambulatory Visit (INDEPENDENT_AMBULATORY_CARE_PROVIDER_SITE_OTHER): Payer: Medicaid Other | Admitting: Internal Medicine

## 2014-05-11 ENCOUNTER — Encounter: Payer: Self-pay | Admitting: Internal Medicine

## 2014-05-11 ENCOUNTER — Other Ambulatory Visit: Payer: Self-pay | Admitting: Dietician

## 2014-05-11 VITALS — BP 149/87 | HR 65 | Temp 97.5°F | Ht 59.0 in | Wt 182.2 lb

## 2014-05-11 DIAGNOSIS — E119 Type 2 diabetes mellitus without complications: Secondary | ICD-10-CM

## 2014-05-11 DIAGNOSIS — E1149 Type 2 diabetes mellitus with other diabetic neurological complication: Secondary | ICD-10-CM

## 2014-05-11 DIAGNOSIS — T465X5A Adverse effect of other antihypertensive drugs, initial encounter: Secondary | ICD-10-CM

## 2014-05-11 DIAGNOSIS — T464X5A Adverse effect of angiotensin-converting-enzyme inhibitors, initial encounter: Principal | ICD-10-CM

## 2014-05-11 DIAGNOSIS — R05 Cough: Secondary | ICD-10-CM

## 2014-05-11 DIAGNOSIS — I10 Essential (primary) hypertension: Secondary | ICD-10-CM

## 2014-05-11 DIAGNOSIS — R058 Other specified cough: Secondary | ICD-10-CM

## 2014-05-11 DIAGNOSIS — R059 Cough, unspecified: Secondary | ICD-10-CM

## 2014-05-11 DIAGNOSIS — R238 Other skin changes: Secondary | ICD-10-CM

## 2014-05-11 DIAGNOSIS — B009 Herpesviral infection, unspecified: Secondary | ICD-10-CM

## 2014-05-11 DIAGNOSIS — R053 Chronic cough: Secondary | ICD-10-CM | POA: Insufficient documentation

## 2014-05-11 LAB — GLUCOSE, CAPILLARY: GLUCOSE-CAPILLARY: 186 mg/dL — AB (ref 70–99)

## 2014-05-11 LAB — POCT GLYCOSYLATED HEMOGLOBIN (HGB A1C): Hemoglobin A1C: 7.3

## 2014-05-11 MED ORDER — METFORMIN HCL 1000 MG PO TABS: 1000.0000 mg | ORAL_TABLET | Freq: Two times a day (BID) | ORAL | Status: DC

## 2014-05-11 MED ORDER — SULFAMETHOXAZOLE-TMP DS 800-160 MG PO TABS: 1.0000 | ORAL_TABLET | Freq: Two times a day (BID) | ORAL | Status: DC

## 2014-05-11 MED ORDER — ACCU-CHEK FASTCLIX LANCETS MISC: Status: DC

## 2014-05-11 MED ORDER — HYDROCHLOROTHIAZIDE 25 MG PO TABS: 25.0000 mg | ORAL_TABLET | Freq: Every day | ORAL | Status: DC

## 2014-05-11 MED ORDER — SITZ BATH MISC: Status: DC

## 2014-05-11 MED ORDER — GLUCOSE BLOOD VI STRP: ORAL_STRIP | Status: DC

## 2014-05-11 MED ORDER — ASPIRIN EC 81 MG PO TBEC: 81.0000 mg | DELAYED_RELEASE_TABLET | Freq: Every day | ORAL | Status: DC

## 2014-05-11 MED ORDER — ACCU-CHEK NANO SMARTVIEW W/DEVICE KIT: PACK | Status: DC

## 2014-05-11 NOTE — Assessment & Plan Note (Signed)
Lab Results  Component Value Date   HGBA1C 7.3 05/11/2014   HGBA1C 7.6 01/16/2014   HGBA1C 8.3 11/01/2013    Assessment: Diabetes control: good control (HgbA1C at goal) Progress toward A1C goal:  at goal Comments: 7.3 today  Plan: Medications:  continue current medications lantus 58 units qam Home glucose monitoring: Frequency: 3 times a day Timing: before meals Instruction/counseling given: reminded to bring blood glucose meter & log to each visit, reminded to bring medications to each visit and discussed the need for weight loss   Needs new meter and supplies, will have cde place order based on what meter she prefers

## 2014-05-11 NOTE — Assessment & Plan Note (Signed)
BP Readings from Last 3 Encounters:  05/11/14 149/87  04/02/14 130/81  03/28/14 138/86   Lab Results  Component Value Date   NA 135 08/21/2013   K 3.7 08/21/2013   CREATININE 0.69 08/21/2013    Assessment: Blood pressure control: controlled Progress toward BP goal:  at goal Comments: d/c acei due to cough, started arb  Plan: Medications:  d/c lisinopril 20mg  and start losartan 50mg  qd

## 2014-05-11 NOTE — Patient Instructions (Addendum)
General Instructions:  Dear Ms. Orosz  Pleas STOP Lisinopril for your blood pressure due to cough  Please START Losartan 50mg  daily for blood pressure starting tomorrow  Please continue your insulin and metformin, you are doing great! Work on your weight loss  Please start your antibiotics, bactrim twice a day by mouth for 10 days  Return in 2 weeks, or sooner if your rash is not improving or getting worse let me know [id number]. Please keep the area clean and dry, do not scrub the area hard. Use warm sitz baths - During a sitz bath, you soak the rectal area in warm water for 10 to 15 minutes two to three times daily. Sitz baths are available in most drugstores. It is also possible to use a bathtub and sit in 2 to 3 inches of warm water. Do not add soap, bubble bath, or other additives in the water.   I will call you with the results of your test today  You may switch to aspirin 81mg  every day instead of the 325mg  if it is irritating your stomach   Please bring your medicines with you each time you come to clinic.  Medicines may include prescription medications, over-the-counter medications, herbal remedies, eye drops, vitamins, or other pills.  Progress Toward Treatment Goals:  Treatment Goal 05/11/2014  Hemoglobin A1C at goal  Blood pressure at goal    Self Care Goals & Plans:  Self Care Goal 05/11/2014  Manage my medications bring my medications to every visit  Monitor my health -  Eat healthy foods -  Be physically active -    Home Blood Glucose Monitoring 05/11/2014  Check my blood sugar 3 times a day  When to check my blood sugar before meals   Care Management & Community Referrals:  Referral 05/09/2013  Referrals made for care management support none needed    Treatment Goals:  Goals (1 Years of Data) as of 05/11/14         As of Today 04/02/14 04/02/14 04/02/14 04/02/14     Blood Pressure    . Blood Pressure < 130/80  149/87 130/81 128/77 116/72 110/67     Result  Component    . HEMOGLOBIN A1C < 7.0  7.3        . LDL CALC < 100           Progress Toward Treatment Goals:  Treatment Goal 05/11/2014  Hemoglobin A1C at goal  Blood pressure at goal    Self Care Goals & Plans:  Self Care Goal 05/11/2014  Manage my medications bring my medications to every visit  Monitor my health -  Eat healthy foods -  Be physically active -    Home Blood Glucose Monitoring 05/11/2014  Check my blood sugar 3 times a day  When to check my blood sugar before meals     Care Management & Community Referrals:  Referral 05/09/2013  Referrals made for care management support none needed     Sulfamethoxazole; Trimethoprim, SMX-TMP tablets What is this medicine? SULFAMETHOXAZOLE; TRIMETHOPRIM or SMX-TMP (suhl fuh meth OK suh zohl; trye METH oh prim) is a combination of a sulfonamide antibiotic and a second antibiotic, trimethoprim. It is used to treat or prevent certain kinds of bacterial infections. It will not work for colds, flu, or other viral infections. This medicine may be used for other purposes; ask your health care provider or pharmacist if you have questions. COMMON BRAND NAME(S): Bacter-Aid DS , Bactrim DS, Bactrim, Septra  DS, Septra What should I tell my health care provider before I take this medicine? They need to know if you have any of these conditions: -anemia -asthma -being treated with anticonvulsants -if you frequently drink alcohol containing drinks -kidney disease -liver disease -low level of folic acid or [id number] dehydrogenase -poor nutrition or malabsorption -porphyria -severe allergies -thyroid disorder -an unusual or allergic reaction to sulfamethoxazole, trimethoprim, sulfa drugs, other medicines, foods, dyes, or preservatives -pregnant or trying to get pregnant -breast-feeding How should I use this medicine? Take this medicine by mouth with a full glass of water. Follow the directions on the prescription label. Take  your medicine at regular intervals. Do not take it more often than directed. Do not skip doses or stop your medicine early. Talk to your pediatrician regarding the use of this medicine in children. Special care may be needed. This medicine has been used in children as young as 88 months of age. Overdosage: If you think you have taken too much of this medicine contact a poison control center or emergency room at once. NOTE: This medicine is only for you. Do not share this medicine with others. What if I miss a dose? If you miss a dose, take it as soon as you can. If it is almost time for your next dose, take only that dose. Do not take double or extra doses. What may interact with this medicine? Do not take this medicine with any of the following medications: -aminobenzoate potassium -dofetilide -metronidazole This medicine may also interact with the following medications: -ACE inhibitors like benazepril, enalapril, lisinopril, and ramipril -cyclosporine -digoxin -diuretics -indomethacin -medicines for diabetes -methenamine -methotrexate -phenytoin -potassium supplements -pyrimethamine -sulfinpyrazone -tricyclic antidepressants -warfarin This list may not describe all possible interactions. Give your health care provider a list of all the medicines, herbs, non-prescription drugs, or dietary supplements you use. Also tell them if you smoke, drink alcohol, or use illegal drugs. Some items may interact with your medicine. What should I watch for while using this medicine? Tell your doctor or health care professional if your symptoms do not improve. Drink several glasses of water a day to reduce the risk of kidney problems. Do not treat diarrhea with over the counter products. Contact your doctor if you have diarrhea that lasts more than 2 days or if it is severe and watery. This medicine can make you more sensitive to the sun. Keep out of the sun. If you cannot avoid being in the sun, wear  protective clothing and use a sunscreen. Do not use sun lamps or tanning beds/booths. What side effects may I notice from receiving this medicine? Side effects that you should report to your doctor or health care professional as soon as possible: -allergic reactions like skin rash or hives, swelling of the face, lips, or tongue -breathing problems -fever or chills, sore throat -irregular heartbeat, chest pain -joint or muscle pain -pain or difficulty passing urine -red pinpoint spots on skin -redness, blistering, peeling or loosening of the skin, including inside the mouth -unusual bleeding or bruising -unusually weak or tired -yellowing of the eyes or skin Side effects that usually do not require medical attention (report to your doctor or health care professional if they continue or are bothersome): -diarrhea -dizziness -headache -loss of appetite -nausea, vomiting -nervousness This list may not describe all possible side effects. Call your doctor for medical advice about side effects. You may report side effects to FDA at 1-800-FDA-1088. Where should I keep my medicine? Keep out  of the reach of children. Store at room temperature between 20 to 25 degrees C (68 to 77 degrees F). Protect from light. Throw away any unused medicine after the expiration date. NOTE: This sheet is a summary. It may not cover all possible information. If you have questions about this medicine, talk to your doctor, pharmacist, or health care provider.  2014, Elsevier/Gold Standard. (2008-07-25 11:32:51)  Losartan tablets What is this medicine? LOSARTAN (loe SAR tan) is used to treat high blood pressure and to reduce the risk of stroke in certain patients. This drug also slows the progression of kidney disease in patients with diabetes. This medicine may be used for other purposes; ask your health care provider or pharmacist if you have questions. COMMON BRAND NAME(S): Cozaar What should I tell my health  care provider before I take this medicine? They need to know if you have any of these conditions: -heart failure -kidney or liver disease -an unusual or allergic reaction to losartan, other medicines, foods, dyes, or preservatives -pregnant or trying to get pregnant -breast-feeding How should I use this medicine? Take this medicine by mouth with a glass of water. Follow the directions on the prescription label. This medicine can be taken with or without food. Take your doses at regular intervals. Do not take your medicine more often than directed. Talk to your pediatrician regarding the use of this medicine in children. Special care may be needed. Overdosage: If you think you have taken too much of this medicine contact a poison control center or emergency room at once. NOTE: This medicine is only for you. Do not share this medicine with others. What if I miss a dose? If you miss a dose, take it as soon as you can. If it is almost time for your next dose, take only that dose. Do not take double or extra doses. What may interact with this medicine? -blood pressure medicines -diuretics, especially triamterene, spironolactone, or amiloride -fluconazole -NSAIDs, medicines for pain and inflammation, like ibuprofen or naproxen -potassium salts or potassium supplements -rifampin This list may not describe all possible interactions. Give your health care provider a list of all the medicines, herbs, non-prescription drugs, or dietary supplements you use. Also tell them if you smoke, drink alcohol, or use illegal drugs. Some items may interact with your medicine. What should I watch for while using this medicine? Visit your doctor or health care professional for regular checks on your progress. Check your blood pressure as directed. Ask your doctor or health care professional what your blood pressure should be and when you should contact him or her. Call your doctor or health care professional if you  notice an irregular or fast heart beat. Women should inform their doctor if they wish to become pregnant or think they might be pregnant. There is a potential for serious side effects to an unborn child, particularly in the second or third trimester. Talk to your health care professional or pharmacist for more information. You may get drowsy or dizzy. Do not drive, use machinery, or do anything that needs mental alertness until you know how this drug affects you. Do not stand or sit up quickly, especially if you are an older patient. This reduces the risk of dizzy or fainting spells. Alcohol can make you more drowsy and dizzy. Avoid alcoholic drinks. Avoid salt substitutes unless you are told otherwise by your doctor or health care professional. Do not treat yourself for coughs, colds, or pain while you are taking this  medicine without asking your doctor or health care professional for advice. Some ingredients may increase your blood pressure. What side effects may I notice from receiving this medicine? Side effects that you should report to your doctor or health care professional as soon as possible: -confusion, dizziness, light headedness or fainting spells -decreased amount of urine passed -difficulty breathing or swallowing, hoarseness, or tightening of the throat -fast or irregular heart beat, palpitations, or chest pain -skin rash, itching -swelling of your face, lips, tongue, hands, or feet Side effects that usually do not require medical attention (report to your doctor or health care professional if they continue or are bothersome): -cough -decreased sexual function or desire -headache -nasal congestion or stuffiness -nausea or stomach pain -sore or cramping muscles This list may not describe all possible side effects. Call your doctor for medical advice about side effects. You may report side effects to FDA at 1-800-FDA-1088. Where should I keep my medicine? Keep out of the reach of  children. Store at room temperature between 15 and 30 degrees C (59 and 86 degrees F). Protect from light. Keep container tightly closed. Throw away any unused medicine after the expiration date. NOTE: This sheet is a summary. It may not cover all possible information. If you have questions about this medicine, talk to your doctor, pharmacist, or health care provider.  2014, Elsevier/Gold Standard. (2008-02-03 16:42:18)   Sitz Bath A sitz bath is a warm water bath taken in the sitting position. The water covers only the hips and butt (buttocks). It may be used for either healing or cleaning purposes. Sitz baths are also used to relieve pain, itching, or muscle tightening (spasms). The water may contain medicine. Moist heat will help you heal and relax.  HOME CARE  Take 3 to 4 sitz baths a day. 1. Fill the bathtub half-full with warm water. 2. Sit in the water and open the drain a little. 3. Turn on the warm water to keep the tub half-full. Keep the water running constantly. 4. Soak in the water for 15 to 20 minutes. 5. After the sitz bath, pat the affected area dry. GET HELP RIGHT AWAY IF: You get worse instead of better. Stop the sitz baths if you get worse. MAKE SURE YOU:  Understand these instructions.  Will watch your condition.  Will get help right away if you are not doing well or get worse. Document Released: 12/31/2004 Document Revised: 08/17/2012 Document Reviewed: 03/23/2011 [facility] Patient Information 2014 [place].

## 2014-05-11 NOTE — Assessment & Plan Note (Addendum)
Currently on R buttock with surrounding erythematous ring and mild induration and fluctuance, tender to palpation, vesicular appearing. Reports similar rashes in the past in area or between legs. Denies recent sexual encounters x4 years. Has been scrubbing hard in the area, cold be secondary to irritation. Report pus drainage but none at this time, covered with neosporin ointment at this time. Tenderness to palpation. Denies hx of herpes. Differentials include irritation leading to cellulitis, possible forming abscess, herpes.   Please see photo in progress note  -hsv culture done in opc today, will hold on starting antivirals at this time until further information -will empirically treat for cellulitis with bactrim po bid x10 days for now given diabetes hx as well -sitz baths  -advised to keep area clean and dry -RTC 2 weeks or sooner if worsening of no improvement -margins around rash drawn with marker today, advised to monitor for possible spread or worsening and to notify us right away  Addendum 05/14/14: HSV 2 + CULTURE -discussed with Dr. Johnnye Sima on the phone. Will treat with valtrex 500mg  po bid x5 days and monitor for recurrence -I reviewed the results with the patient on the phone today, went over treatment, reviewed sexually transmitted infections and precautions in the future which she claims to understand

## 2014-05-11 NOTE — Assessment & Plan Note (Signed)
Persistent for several months. On lisinopril 20mg , will transition to ARB today.  D/c lisinopril Start losartan 50mg  qd, follow up 2-4 weejs to recheck bp and titrate ARB as needed. Discussed dose with inpatient pharmacist today.

## 2014-05-11 NOTE — Telephone Encounter (Signed)
Doctor requested meter for glucose monitoring and supplies.

## 2014-05-11 NOTE — Progress Notes (Signed)
Subjective:   Patient ID: Nancy [name] female   DOB: 08/13/1956 58 y.o.   MRN: [id number]  HPI: [name] is a 58 y.o. African American female with PMH of DM2 and b/l carpal tunnel syndrome presenting to [facility] today for acute visit in regards to eye appt referral, and persistent dry cough. She also reports a ?rash on her buttocks.   DM2: A1c 7.6 01/2014, today 7.3. Will recheck today and do foot exam. Last eye exam done was 03/2014 by Dr. Peter [name]. No retinopathy noted on exam, however, is noted to have cataract b/l eyes. She had called to inquire when to follow up on that, and I have advised her to discuss with Dr. Marylouise [name] office in regards to appropriate follow which she will do. CBG 186. Did not bring her meter or medications today. Down 2 pounds since April 2015. Today, 182.2lb's.   Persistent dry cough: ongoing for several months, also complained of it in February 2015. Likely 2/2 ACEi, on Lisinopril 56m. Will transition to ARB today, Losartan 582mqd. BP today 149/87. Last Cr 08/2013 0.69.  Broken dentures--inquired about dental referral. Does not need formal referral. Provided information on medicaid accepting providers in the area who she will contact.   Past Medical History  Diagnosis Date  . Diabetic peripheral neuropathy   . Carpal tunnel syndrome, bilateral     confirmed on nerve conduction studies  . Hyperlipidemia   . Diabetes mellitus type 2, uncontrolled   . GERD (gastroesophageal reflux disease)   . Heat edema   . History of Helicobacter infection 11/07  . History of chest pain     negative cardiolite September 2007.  ETT-myoview 12/10 with EF 76%, no ischemia,   . History of endometriosis   . Carpal tunnel syndrome    Current Outpatient Prescriptions  Medication Sig Dispense Refill  . aspirin 325 MG EC tablet Take 325 mg by mouth daily.      . Marland Kitchentorvastatin (LIPITOR) 10 MG tablet Take 20 mg by mouth.      . Blood Glucose Monitoring Suppl (BLOOD GLUCOSE METER)  kit Use to check blood sugar up to 3 times a day DX code 250.02 insulin requiring  1 each  0  . esomeprazole (NEXIUM) 40 MG capsule Take 1 capsule (40 mg total) by mouth daily.  30 capsule  3  . glucose blood (WAVESENSE PRESTO) test strip Use to check blood sugar up to 3 times a day DX code 250.02 insulin requiring  100 each  12  . hydrochlorothiazide (HYDRODIURIL) 25 MG tablet Take 1 tablet (25 mg total) by mouth daily.  30 tablet  11  . insulin glargine (LANTUS) 100 UNIT/ML injection Inject 0.58 mLs (58 Units total) into the skin every morning. When you wake up  30 mL  10  . Lancets MISC Use to check blood sugar 3 times a day  100 each  5  . lisinopril (PRINIVIL,ZESTRIL) 20 MG tablet Take 1 tablet (20 mg total) by mouth daily.  30 tablet  11  . metFORMIN (GLUCOPHAGE) 1000 MG tablet Take 1 tablet (1,000 mg total) by mouth 2 (two) times daily with a meal.  60 tablet  11   No current facility-administered medications for this visit.   Family History  Problem Relation Age of Onset  . Diabetes Mother   . Colon cancer Neg Hx   . Lung cancer Maternal Uncle   . Breast cancer Maternal Grandmother   . Liver cancer Maternal Grandfather  History   Social History  . Marital Status: Single    Spouse Name: N/A    Number of Children: 0  . Years of Education: 11   Occupational History  . CNA    Social History Main Topics  . Smoking status: Never Smoker   . Smokeless tobacco: Never Used  . Alcohol Use: No  . Drug Use: No  . Sexual Activity: Not on file   Other Topics Concern  . Not on file   Social History Narrative   Financial assistance approved for 100% discount at [facility] and has [facility] card per [employer]   09/26/2010   Review of Systems:  Constitutional:  Denies fever, chills, diaphoresis, appetite change and fatigue.   HEENT:  Cataract, loss of teeth, wears dentures  Respiratory:  Cough.   Cardiovascular:  Occasional brief chest pain/heart burn. Denies sob  Gastrointestinal:   Denies nausea, vomiting. Occasional abdominal pain.   Genitourinary:  Rash on right buttock  Musculoskeletal:  B/l Carpal tunnel  Skin:  Rash on right buttock  Neurological:  Denies syncope.    Objective:  Physical Exam: Filed Vitals:   05/11/14 1113  BP: 149/87  Pulse: 65  Temp: 97.5 F (36.4 C)  TempSrc: Oral  Height: '4\' 11"'  (1.499 m)  Weight: 182 lb 3.2 oz (82.645 kg)  SpO2: 100%   Vitals reviewed. General: sitting in chair, NAD HEENT: EOMI, dentures in place on top row, broken portion on top of front teeth Cardiac: RRR, no rubs, murmurs or gallops Pulm: clear to auscultation bilaterally, no wheezes, rales, or rhonchi Abd: soft, obese, nontender, nondistended, BS present Ext: warm and well perfused, no pedal edema, +2DP B/L, moving all extremities GU: 4-5 vesicular appearing rash on R lateral surface of anal crease, tender to palpation, surrounding ring of erythema, no visible drainage but wet appearing due to topical neosporin on the area, palpable mild induration and fluctuance in surrounding area, margins drawn to monitor extent of spread today with marker Neuro: alert and oriented X3, strength decreased due to effort and pain in b/l upper extremities, 5/5 lower extremities, sensation grossly intact    Assessment & Plan:  Discussed with Dr. Stann Mainland ACEI cough--d/c lisinopril, start losartan Vesicular R buttock rash--HSV cx done, started bactrim for cellulitis bid x10 days RTC 2 weeks, BP and rash check

## 2014-05-14 LAB — HERPES SIMPLEX VIRUS CULTURE: Organism ID, Bacteria: DETECTED

## 2014-05-14 MED ORDER — VALACYCLOVIR HCL 500 MG PO TABS: 500.0000 mg | ORAL_TABLET | Freq: Two times a day (BID) | ORAL | Status: AC

## 2014-05-14 MED ORDER — LOSARTAN POTASSIUM 50 MG PO TABS: 50.0000 mg | ORAL_TABLET | Freq: Every day | ORAL | Status: DC

## 2014-05-14 NOTE — Addendum Note (Signed)
Addended by: Wilber Oliphant on: 05/14/2014 05:05 PM   Modules accepted: Orders

## 2014-05-14 NOTE — Progress Notes (Signed)
Case discussed with Dr. Qureshi soon after the resident saw the patient.  We reviewed the resident's history and exam and pertinent patient test results.  I agree with the assessment, diagnosis, and plan of care documented in the resident's note. 

## 2014-05-14 NOTE — Addendum Note (Signed)
Addended by: Wilber Oliphant on: 05/14/2014 11:26 AM   Modules accepted: Orders

## 2014-05-16 ENCOUNTER — Telehealth: Payer: Self-pay | Admitting: *Deleted

## 2014-05-16 NOTE — Telephone Encounter (Signed)
[place] called - stated PA is required; form faxed; given to [employer].

## 2014-05-16 NOTE — Telephone Encounter (Signed)
Submitted request online via [place], request approved until 05/15/2014.  Ace Inhibitors causes a cough.Regenia Skeeter, Darlene Cassady6/10/20155:01 PM

## 2014-05-16 NOTE — Telephone Encounter (Signed)
Can we investigate this further? Insurance should cover it because she cannot tolerate ACEi

## 2014-05-16 NOTE — Telephone Encounter (Signed)
Call from pt - states her insurance will not pay for Losartan. Thanks

## 2014-05-28 ENCOUNTER — Ambulatory Visit (INDEPENDENT_AMBULATORY_CARE_PROVIDER_SITE_OTHER): Payer: Medicaid Other | Admitting: Internal Medicine

## 2014-05-28 ENCOUNTER — Encounter: Payer: Self-pay | Admitting: Internal Medicine

## 2014-05-28 VITALS — BP 154/99 | HR 72 | Temp 97.6°F | Ht 59.0 in | Wt 183.3 lb

## 2014-05-28 DIAGNOSIS — E1149 Type 2 diabetes mellitus with other diabetic neurological complication: Secondary | ICD-10-CM

## 2014-05-28 DIAGNOSIS — B009 Herpesviral infection, unspecified: Secondary | ICD-10-CM

## 2014-05-28 DIAGNOSIS — I10 Essential (primary) hypertension: Secondary | ICD-10-CM

## 2014-05-28 NOTE — Assessment & Plan Note (Addendum)
Lab Results  Component Value Date   HGBA1C 7.3 05/11/2014   HGBA1C 7.6 01/16/2014   HGBA1C 8.3 11/01/2013    Assessment: Diabetes control: fair control Progress toward A1C goal:     Plan: Medications:  continue current medications metformin 1000mg  bid and lantus 58 units qam Home glucose monitoring: Frequency: 3 times a day Timing: before meals  Foot exam done today

## 2014-05-28 NOTE — Assessment & Plan Note (Signed)
Completed valtrex course. Healing rash. No new outbreak.  -advised to let us know if another outbreak

## 2014-05-28 NOTE — Progress Notes (Signed)
INTERNAL MEDICINE TEACHING ATTENDING ADDENDUM - Aldine Contes, MD: I reviewed and discussed with the resident Dr. Eula Fried, the patient's medical history, physical examination, diagnosis and results of pertinent tests and treatment and I agree with the patient's care as documented.

## 2014-05-28 NOTE — Patient Instructions (Signed)
General Instructions: We are getting your medication improved and will have the pharmacy call you when its ready.  Please bring your medicines with you each time you come to clinic.  Medicines may include prescription medications, over-the-counter medications, herbal remedies, eye drops, vitamins, or other pills.  Come back in 1-2 months for bp follow up with new medicine once started  Anymore outbreaks let us know Progress Toward Treatment Goals:  Treatment Goal 05/28/2014  Hemoglobin A1C -  Blood pressure deteriorated   Self Care Goals & Plans:  Self Care Goal 05/28/2014  Manage my medications bring my medications to every visit  Monitor my health bring my glucose meter and log to each visit; keep track of my blood glucose; check my feet daily  Eat healthy foods eat more vegetables; eat foods that are low in salt; eat baked foods instead of fried foods  Be physically active take a walk every day    Home Blood Glucose Monitoring 05/28/2014  Check my blood sugar 3 times a day  When to check my blood sugar before meals     Care Management & Community Referrals:  Referral 05/09/2013  Referrals made for care management support none needed

## 2014-05-28 NOTE — Progress Notes (Signed)
 Subjective:   Patient ID: Nancy [name] female   DOB: 03/14/1956 58 y.o.   MRN: [id number]  HPI: [name] is a 58 y.o. female with HTN, DM2, and recent HSV2 rash on R buttock presenting to opc today for follow up visit.   HTN--she still has not gotten the ARB filled due to insurance issues. Triage is working to find the approval with the insurance again. BP remains elevated today, still taking HCTZ and lisinopril for now. Stressed about her mother who is not feeling well with recent round of chemotherapy.   HSV2--rash improved. She now recalls where she may have gotten herpes from, a promiscuous old sexual partner.  She did not take the bactrim antibiotics due to fear of side-effects but did finish the valtrex course.   Past Medical History  Diagnosis Date  . Diabetic peripheral neuropathy   . Carpal tunnel syndrome, bilateral     confirmed on nerve conduction studies  . Hyperlipidemia   . Diabetes mellitus type 2, uncontrolled   . GERD (gastroesophageal reflux disease)   . Heat edema   . History of Helicobacter infection 11/07  . History of chest pain     negative cardiolite September 2007.  ETT-myoview 12/10 with EF 76%, no ischemia,   . History of endometriosis   . Carpal tunnel syndrome    Current Outpatient Prescriptions  Medication Sig Dispense Refill  . ACCU-CHEK FASTCLIX LANCETS MISC Use to check blood sugar up to 3 times a day DX code 250.02 insulin requiring  102 each  5  . aspirin 81 MG tablet Take 1 tablet (81 mg total) by mouth daily.      . atorvastatin (LIPITOR) 10 MG tablet Take 20 mg by mouth.      . Blood Glucose Monitoring Suppl (ACCU-CHEK NANO SMARTVIEW) W/DEVICE KIT Use to check blood sugar up to 3 times a day DX code 250.02 insulin requiring  1 kit  0  . esomeprazole (NEXIUM) 40 MG capsule Take 1 capsule (40 mg total) by mouth daily.  30 capsule  3  . glucose blood (ACCU-CHEK SMARTVIEW) test strip Use to check blood sugar up to 3 times a day DX  code 250.02 insulin requiring  100 each  5  . hydrochlorothiazide (HYDRODIURIL) 25 MG tablet Take 1 tablet (25 mg total) by mouth daily.  30 tablet  11  . insulin glargine (LANTUS) 100 UNIT/ML injection Inject 0.58 mLs (58 Units total) into the skin every morning. When you wake up  30 mL  10  . losartan (COZAAR) 50 MG tablet Take 1 tablet (50 mg total) by mouth daily.  30 tablet  5  . metFORMIN (GLUCOPHAGE) 1000 MG tablet Take 1 tablet (1,000 mg total) by mouth 2 (two) times daily with a meal.  60 tablet  11  . Misc. Devices (SITZ BATH) MISC Soak the rectal area in warm water for 10 to 15 minutes two to three times daily.  1 each  1   No current facility-administered medications for this visit.   Family History  Problem Relation Age of Onset  . Diabetes Mother   . Colon cancer Neg Hx   . Lung cancer Maternal Uncle   . Breast cancer Maternal Grandmother   . Liver cancer Maternal Grandfather    History   Social History  . Marital Status: Single    Spouse Name: N/A    Number of Children: 0  . Years of Education: 11   Occupational History  . CNA      Social History Main Topics  . Smoking status: Never Smoker   . Smokeless tobacco: Never Used  . Alcohol Use: No  . Drug Use: No  . Sexual Activity: Not on file   Other Topics Concern  . Not on file   Social History Narrative   Financial assistance approved for 100% discount at [facility] and has [facility] card per [employer]   09/26/2010   Review of Systems:  Constitutional:  Denies fever, chills  HEENT:  Denies congestion  Respiratory:  Denies SOB  Cardiovascular:  Denies chest pain  Gastrointestinal:  Denies nausea, vomiting  Genitourinary:  Healing HSV rash on R buttock  Musculoskeletal:  B/l wrist pain  Skin:  R buttock rash  Neurological:  Denies syncope   Objective:  Physical Exam: Filed Vitals:   05/28/14 1324  BP: 154/99  Pulse: 72  Temp: 97.6 F (36.4 C)  TempSrc: Oral  Height: 4' 11" (1.499 m)  Weight: 183 lb 4.8  oz (83.144 kg)  SpO2: 99%   Vitals reviewed. General: sitting in chair, NAD HEENT: EOMI Cardiac: RRR Pulm: clear to auscultation bilaterally, no wheezes, rales, or rhonchi Abd: soft, obese,nondistended, BS present Ext: warm and well perfused, no pedal edema, +2DP B/L Neuro: alert and oriented X3 R buttock: healing rash, with no surrounding erythema or induration today     Assessment & Plan:  Discussed with Dr. Dareen Piano

## 2014-05-28 NOTE — Addendum Note (Signed)
Addended by: Wilber Oliphant on: 05/28/2014 01:52 PM   Modules accepted: Orders

## 2014-05-28 NOTE — Assessment & Plan Note (Signed)
BP Readings from Last 3 Encounters:  05/28/14 154/99  05/11/14 149/87  04/02/14 130/81   Lab Results  Component Value Date   NA 135 08/21/2013   K 3.7 08/21/2013   CREATININE 0.69 08/21/2013   Assessment: Blood pressure control: mildly elevated Progress toward BP goal:  deteriorated Comments: has not picked up ARB yet from pharmacy due to insurance approval  Plan: Medications:  continue current medications hctz and losartan 50mg  qd (once able to start per pharmacy) Educational resources provided:   Self management tools provided:   Other plans: RN working to get approval for ARB. RTC 1-2 months for recheck of BP and adjust dose of medication as needed

## 2014-06-25 ENCOUNTER — Other Ambulatory Visit: Payer: Self-pay | Admitting: *Deleted

## 2014-06-26 ENCOUNTER — Other Ambulatory Visit: Payer: Self-pay | Admitting: *Deleted

## 2014-06-26 MED ORDER — ATORVASTATIN CALCIUM 10 MG PO TABS: 20.0000 mg | ORAL_TABLET | Freq: Every day | ORAL | Status: DC

## 2014-07-03 ENCOUNTER — Encounter: Payer: Self-pay | Admitting: Internal Medicine

## 2014-07-03 ENCOUNTER — Ambulatory Visit (INDEPENDENT_AMBULATORY_CARE_PROVIDER_SITE_OTHER): Payer: Medicaid Other | Admitting: Internal Medicine

## 2014-07-03 VITALS — BP 135/85 | HR 81 | Temp 97.9°F | Ht 59.0 in | Wt 185.7 lb

## 2014-07-03 DIAGNOSIS — R252 Cramp and spasm: Secondary | ICD-10-CM

## 2014-07-03 DIAGNOSIS — I10 Essential (primary) hypertension: Secondary | ICD-10-CM

## 2014-07-03 DIAGNOSIS — E1149 Type 2 diabetes mellitus with other diabetic neurological complication: Secondary | ICD-10-CM

## 2014-07-03 DIAGNOSIS — G5600 Carpal tunnel syndrome, unspecified upper limb: Secondary | ICD-10-CM

## 2014-07-03 DIAGNOSIS — E119 Type 2 diabetes mellitus without complications: Secondary | ICD-10-CM

## 2014-07-03 DIAGNOSIS — K219 Gastro-esophageal reflux disease without esophagitis: Secondary | ICD-10-CM

## 2014-07-03 DIAGNOSIS — G5603 Carpal tunnel syndrome, bilateral upper limbs: Secondary | ICD-10-CM

## 2014-07-03 DIAGNOSIS — E785 Hyperlipidemia, unspecified: Secondary | ICD-10-CM

## 2014-07-03 LAB — GLUCOSE, CAPILLARY: GLUCOSE-CAPILLARY: 175 mg/dL — AB (ref 70–99)

## 2014-07-03 MED ORDER — ESOMEPRAZOLE MAGNESIUM 40 MG PO CPDR: 40.0000 mg | DELAYED_RELEASE_CAPSULE | Freq: Every day | ORAL | Status: DC

## 2014-07-03 MED ORDER — INSULIN GLARGINE 100 UNIT/ML ~~LOC~~ SOLN: 58.0000 [IU] | SUBCUTANEOUS | Status: DC

## 2014-07-03 NOTE — Assessment & Plan Note (Signed)
Stopped lipitor today given complaints of cramping. Was on crestor in the past. Lipid panel well controlled in the pat with last LDL 53.   If no change with stopping lipitor, will likely change to pravastatin

## 2014-07-03 NOTE — Assessment & Plan Note (Signed)
BP Readings from Last 3 Encounters:  07/03/14 135/85  05/28/14 154/99  05/11/14 149/87   Lab Results  Component Value Date   NA 135 08/21/2013   K 3.7 08/21/2013   CREATININE 0.69 08/21/2013   Assessment: Blood pressure control: controlled Progress toward BP goal:  at goal  Plan: Medications:  continue current medications hctz 25 and cozaar 50

## 2014-07-03 NOTE — Patient Instructions (Signed)
General Instructions:  Lets try stopping your Lipitor and see if the cramping get's better. If still continues after 2-3 weeks of stopping the statin, then call me and i will order ABI's to check for Peripheral vascular disease.   Please bring your meter and medications to your next visit. Continue to work on diet and exercise for your diabetes.   Please bring your medicines with you each time you come to clinic.  Medicines may include prescription medications, over-the-counter medications, herbal remedies, eye drops, vitamins, or other pills.   Progress Toward Treatment Goals:  Treatment Goal 07/03/2014  Hemoglobin A1C improved  Blood pressure at goal    Self Care Goals & Plans:  Self Care Goal 07/03/2014  Manage my medications bring my medications to every visit  Monitor my health check my feet daily; bring my glucose meter and log to each visit; keep track of my blood pressure  Eat healthy foods eat foods that are low in salt; eat more vegetables; eat baked foods instead of fried foods  Be physically active find an activity I enjoy; find time in my schedule    Home Blood Glucose Monitoring 07/03/2014  Check my blood sugar -  When to check my blood sugar before meals     Care Management & Community Referrals:  Referral 05/09/2013  Referrals made for care management support none needed

## 2014-07-03 NOTE — Assessment & Plan Note (Addendum)
Lab Results  Component Value Date   HGBA1C 7.3 05/11/2014   HGBA1C 7.6 01/16/2014   HGBA1C 8.3 11/01/2013    Assessment: Diabetes control: good control (HgbA1C at goal) Progress toward A1C goal:  improved admits to falling off the diet plan a bit but getting better due to stress.   Plan: Medications:  continue current medications lantus 58, metformin 1000mg  bid Home glucose monitoring: Frequency:   Timing: before meals Instruction/counseling given: reminded to bring blood glucose meter & log to each visit, reminded to bring medications to each visit and discussed the need for weight loss Educational resources provided:   Self management tools provided:   Other plans: did not bring meter to clinic today.

## 2014-07-03 NOTE — Assessment & Plan Note (Signed)
L>R but does not wish to have more surgery or return to [facility] at this time.   -recommended wearing splints again, especially at night and getting them properly sized -she said she may look for other surgeons now that she has medicaid

## 2014-07-03 NOTE — Assessment & Plan Note (Signed)
Sent nexium prescription to [facility]

## 2014-07-03 NOTE — Progress Notes (Signed)
Subjective:   Patient ID: Nancy [name] female   DOB: 06-01-1956 58 y.o.   MRN: [id number]  HPI: [name] is a 58 y.o. female with DM2 and HTN presenting to opc today for BP follow up visit. She has started her Cozaar and BP much improved today to goal.   She is requesting all her medications including insulin to be sent to [facility].   Today, she complains of worsening lower extremity pain/cramping feeling, worse when walking but at times at rest as well. This has been on going for at least 1 year. Denies numbness and tingling and not really foot pain, but more her legs. She is on a statin.   Past Medical History  Diagnosis Date  . Diabetic peripheral neuropathy   . Carpal tunnel syndrome, bilateral     confirmed on nerve conduction studies  . Hyperlipidemia   . Diabetes mellitus type 2, uncontrolled   . GERD (gastroesophageal reflux disease)   . Heat edema   . History of Helicobacter infection 11/07  . History of chest pain     negative cardiolite September 2007.  ETT-myoview 12/10 with EF 76%, no ischemia,   . History of endometriosis   . Carpal tunnel syndrome    Current Outpatient Prescriptions  Medication Sig Dispense Refill  . ACCU-CHEK FASTCLIX LANCETS MISC Use to check blood sugar up to 3 times a day DX code 250.02 insulin requiring  102 each  5  . aspirin 81 MG tablet Take 1 tablet (81 mg total) by mouth daily.      Marland Kitchen atorvastatin (LIPITOR) 10 MG tablet Take 2 tablets (20 mg total) by mouth daily at 6 PM.  180 tablet  4  . Blood Glucose Monitoring Suppl (ACCU-CHEK NANO SMARTVIEW) W/DEVICE KIT Use to check blood sugar up to 3 times a day DX code 250.02 insulin requiring  1 kit  0  . esomeprazole (NEXIUM) 40 MG capsule Take 1 capsule (40 mg total) by mouth daily.  30 capsule  3  . glucose blood (ACCU-CHEK SMARTVIEW) test strip Use to check blood sugar up to 3 times a day DX code 250.02 insulin requiring  100 each  5  . hydrochlorothiazide (HYDRODIURIL) 25  MG tablet Take 1 tablet (25 mg total) by mouth daily.  30 tablet  11  . insulin glargine (LANTUS) 100 UNIT/ML injection Inject 0.58 mLs (58 Units total) into the skin every morning. When you wake up  30 mL  10  . losartan (COZAAR) 50 MG tablet Take 1 tablet (50 mg total) by mouth daily.  30 tablet  5  . metFORMIN (GLUCOPHAGE) 1000 MG tablet Take 1 tablet (1,000 mg total) by mouth 2 (two) times daily with a meal.  60 tablet  11  . Misc. Devices (SITZ BATH) MISC Soak the rectal area in warm water for 10 to 15 minutes two to three times daily.  1 each  1   No current facility-administered medications for this visit.   Family History  Problem Relation Age of Onset  . Diabetes Mother   . Colon cancer Neg Hx   . Lung cancer Maternal Uncle   . Breast cancer Maternal Grandmother   . Liver cancer Maternal Grandfather    History   Social History  . Marital Status: Single    Spouse Name: N/A    Number of Children: 0  . Years of Education: 11   Occupational History  . CNA    Social History Main Topics  .  Smoking status: Never Smoker   . Smokeless tobacco: Never Used  . Alcohol Use: No  . Drug Use: No  . Sexual Activity: None   Other Topics Concern  . None   Social History Narrative   Financial assistance approved for 100% discount at [facility] and has [facility] card per [employer]   09/26/2010   Review of Systems:  Constitutional:  Denies fever, chills  HEENT:  Denies congestion  Respiratory:  Denies SOB  Cardiovascular:  Denies chest pain, palpitations  Gastrointestinal:  Denies nausea, vomiting, abdominal pain  Genitourinary:  Denies dysuria  Musculoskeletal:  B/l hand/wrist pain, L>R, cramping in lower extremities worse when walking  Skin:  Denies pallor, rash and wound.   Neurological:  Denies dizziness   Objective:  Physical Exam: Filed Vitals:   07/03/14 1317  BP: 135/85  Pulse: 81  Temp: 97.9 F (36.6 C)  TempSrc: Oral  Height: _0  (1.499 m)  Weight: 185 lb 11.2 oz  (84.233 kg)  SpO2: 100%   Vitals reviewed. General: sitting in chair, NAD HEENT: EOMI Cardiac: RRR Pulm: clear to auscultation bilaterally Abd: soft, obese, BS present Ext: warm and well perfused, trace lower extremity edema, non-tender to palpation, +2 dp b/l Neuro: alert and oriented X3 strength 5/5 b/l lower extremities, decreased in upper extremities due to grip strength, effort, and pain that is same as prior  Assessment & Plan:  Discussed with Dr. Dareen Piano Stopped statin

## 2014-07-03 NOTE — Progress Notes (Signed)
INTERNAL MEDICINE TEACHING ATTENDING ADDENDUM - Nancy Waltz, MD: I reviewed and discussed at the time of visit with the resident Dr. Qureshi, the patient's medical history, physical examination, diagnosis and results of pertinent tests and treatment and I agree with the patient's care as documented.  

## 2014-07-03 NOTE — Assessment & Plan Note (Addendum)
B/l extremities. >1 year. Worse with walking but also at rest at times.   Given PMH, she is certainly at risk for PVD, however she is also on a statin. Thus, will favor stopping statin first to see if any improvement in symptoms in case of myopathy related to Lipitor (was on crestor in the past). If no improvement after a few weeks, will get ABI's. She wishes to continue her statin for now given that she just got her recent refill and says pain is not that bad. She will likely stop the statin once her current prescription is over she claims unless very severe pain.   -I have recommended her to at least stop for now and monitor how she feels and call in 2-3 weeks to let me know; we may resume if not related to pain and she will then not have to waste her prescription.

## 2014-08-02 ENCOUNTER — Encounter: Payer: Self-pay | Admitting: Internal Medicine

## 2014-08-02 ENCOUNTER — Ambulatory Visit (INDEPENDENT_AMBULATORY_CARE_PROVIDER_SITE_OTHER): Payer: Medicaid Other | Admitting: Internal Medicine

## 2014-08-02 VITALS — BP 143/92 | HR 83 | Temp 97.0°F | Wt 184.8 lb

## 2014-08-02 DIAGNOSIS — R059 Cough, unspecified: Secondary | ICD-10-CM

## 2014-08-02 DIAGNOSIS — K219 Gastro-esophageal reflux disease without esophagitis: Secondary | ICD-10-CM

## 2014-08-02 DIAGNOSIS — K209 Esophagitis, unspecified without bleeding: Secondary | ICD-10-CM

## 2014-08-02 DIAGNOSIS — T464X5A Adverse effect of angiotensin-converting-enzyme inhibitors, initial encounter: Secondary | ICD-10-CM

## 2014-08-02 DIAGNOSIS — R05 Cough: Secondary | ICD-10-CM

## 2014-08-02 DIAGNOSIS — T465X5A Adverse effect of other antihypertensive drugs, initial encounter: Secondary | ICD-10-CM

## 2014-08-02 DIAGNOSIS — I10 Essential (primary) hypertension: Secondary | ICD-10-CM

## 2014-08-02 MED ORDER — OMEPRAZOLE 20 MG PO CPDR: 20.0000 mg | DELAYED_RELEASE_CAPSULE | Freq: Every day | ORAL | Status: DC

## 2014-08-02 MED ORDER — CETIRIZINE HCL 10 MG PO TABS: 10.0000 mg | ORAL_TABLET | Freq: Every day | ORAL | Status: DC

## 2014-08-02 NOTE — Assessment & Plan Note (Signed)
Per pt request, changed nexium to prilosec for Medicaid coverage. -prilosec 20mg  daily -d/c nexium

## 2014-08-02 NOTE — Progress Notes (Signed)
Patient ID: Brittiny Levitz, female   DOB: Apr 02, 1956, 58 y.o.   MRN: [id number]    Subjective:   Patient ID: Briaunna Grindstaff female    DOB: 07-06-1956 58 y.o.    MRN: [id number] Health Maintenance Due: Health Maintenance Due  Topic Date Due  . Influenza Vaccine  07/07/2014    _________________________________________________  HPI: Ms.Lacreshia Vermette is a 58 y.o. female here for a acute visit for chronic cough.  Pt has a PMH outlined below.  Please see problem-based charting assessment and plan note for further details of medical issues addressed at today's visit.  PMH: Past Medical History  Diagnosis Date  . Diabetic peripheral neuropathy   . Carpal tunnel syndrome, bilateral     confirmed on nerve conduction studies  . Hyperlipidemia   . Diabetes mellitus type 2, uncontrolled   . GERD (gastroesophageal reflux disease)   . Heat edema   . History of Helicobacter infection 11/07  . History of chest pain     negative cardiolite September 2007.  ETT-myoview 12/10 with EF 76%, no ischemia,   . History of endometriosis   . Carpal tunnel syndrome     Medications: Current Outpatient Prescriptions on File Prior to Visit  Medication Sig Dispense Refill  . ACCU-CHEK FASTCLIX LANCETS MISC Use to check blood sugar up to 3 times a day DX code 250.02 insulin requiring  102 each  5  . aspirin 81 MG tablet Take 1 tablet (81 mg total) by mouth daily.      . Blood Glucose Monitoring Suppl (ACCU-CHEK NANO SMARTVIEW) W/DEVICE KIT Use to check blood sugar up to 3 times a day DX code 250.02 insulin requiring  1 kit  0  . glucose blood (ACCU-CHEK SMARTVIEW) test strip Use to check blood sugar up to 3 times a day DX code 250.02 insulin requiring  100 each  5  . hydrochlorothiazide (HYDRODIURIL) 25 MG tablet Take 1 tablet (25 mg total) by mouth daily.  30 tablet  11  . insulin glargine (LANTUS) 100 UNIT/ML injection Inject 0.58 mLs (58 Units total) into the skin every morning. When you wake up  30  mL  10  . losartan (COZAAR) 50 MG tablet Take 1 tablet (50 mg total) by mouth daily.  30 tablet  5  . metFORMIN (GLUCOPHAGE) 1000 MG tablet Take 1 tablet (1,000 mg total) by mouth 2 (two) times daily with a meal.  60 tablet  11   No current facility-administered medications on file prior to visit.    Allergies: Allergies  Allergen Reactions  . Ace Inhibitors Cough    Persistent dry cough  . Amitriptyline Hcl Nausea Only    REACTION: Upset stomach    FH: Family History  Problem Relation Age of Onset  . Diabetes Mother   . Colon cancer Neg Hx   . Lung cancer Maternal Uncle   . Breast cancer Maternal Grandmother   . Liver cancer Maternal Grandfather     SH: History   Social History  . Marital Status: Single    Spouse Name: N/A    Number of Children: 0  . Years of Education: 11   Occupational History  . CNA    Social History Main Topics  . Smoking status: Never Smoker   . Smokeless tobacco: Never Used  . Alcohol Use: No  . Drug Use: No  . Sexual Activity: None   Other Topics Concern  . None   Social History Narrative   Financial assistance approved for 100%  discount at [facility] and has [facility] card per Enedina Finner   09/26/2010    Review of Systems: Constitutional: Negative for fever, chills and weight loss.  Eyes: Negative for blurred vision.  Respiratory: +cough and -shortness of breath.  Cardiovascular: Negative for chest pain, palpitations and leg swelling.  Gastrointestinal: Negative for nausea, vomiting, abdominal pain, +diarrhea, constipation and blood in stool.  Genitourinary: Negative for dysuria, urgency and frequency.  Musculoskeletal: Negative for myalgias and back pain.  Neurological: Negative for dizziness, weakness.   Objective:   Vital Signs: Filed Vitals:   08/02/14 1406  BP: 143/92  Pulse: 83  Temp: 97 F (36.1 C)  TempSrc: Oral  Weight: 184 lb 12.8 oz (83.825 kg)  SpO2: 98%     BP Readings from Last 3 Encounters:  08/02/14 143/92    07/03/14 135/85  05/28/14 154/99    Physical Exam: Constitutional: Vital signs reviewed.  Patient is well-developed and well-nourished in NAD and cooperative with exam.  Head: Normocephalic and atraumatic. Eyes: EOMI, no drainage, conjunctivae nl, no scleral icterus.  Ears: TMs normal, cerumen noted in canal.  Nose: Nasal mucosa normal with mild serous drainage. Throat: Pharynx not injected, no exudates. Neck: Supple. Cardiovascular: RRR, no MRG. Pulmonary/Chest: normal effort, CTAB, no wheezes, rales, or rhonchi. Abdominal: Soft. NT/ND +BS. Neurological: A&O x3, cranial nerves II-XII are grossly intact, moving all extremities. Extremities: 2+DP b/l; no pitting edema. Skin: Warm, dry and intact. No rash.  Assessment & Plan:   Assessment and plan was discussed and formulated with my attending.

## 2014-08-02 NOTE — Assessment & Plan Note (Addendum)
Pt continues to have a non-productive persistent cough that she reports has been going on for "a long time."  When asked how long she stated "years."  She states it started when she was put on an ACEi, which has been discontinued as of 05/2014.  She continues to have cough although it has eased up some.  She describes the cough as occuring daily is worse at night, not associated with food, no SOB, no wheezing, no dyspepsia (on nexium), no LE swelling.  She denies orthopnea or PND and can walk several blocks without getting SOB.  She denies having rhinorrhea, congestion, post-nasal drip, watery eyes, or allergies.  States that she does not have a tickle in her throat and does not clear her throat often.  She does however, report having very "itchy" eyes.  She has never been a smoker, but lives in a high-rise old apartment building and states that everyone in the building smokes and that her mother used to smoke heavily and she has friends which smoke.  There is carpet in her apartment.  No pets.  There is also a h/o asthma in her mother.  08/21/13 CXR was normal.  Possible etiologies include: UACS (upper airway cough syndrome), cough variant asthma, GERD, medications (ACEi has been d/c and not on any other meds known to cause cough).  She reports a history of dysphagia which has resolved, but could have also been a possibility.  Physical exam unremarkable, however, her speech sounds somewhat congested.  It is difficulty to tease out exactly what is the etiology of her cough but likely could be any of the above or a combination.   -continue prilosec (d/c nexium today d/t not on Medicaid formulary) -added zyrtec 10mg  daily  -follow up with Dr. Angelica Pou in October

## 2014-08-02 NOTE — Assessment & Plan Note (Signed)
BP today 143/92.  Reports compliance with HCTZ 25mg  and losartan 50mg  daily.  However, she continues to cough with losartan and believes this is causing her cough since ARBs are much less likely to cause cough.  She does report that the cough has gotten better since discontinuing the ACEi. -continue currents meds of HCTZ 25mg  and losartan 50mg  daily

## 2014-08-02 NOTE — Patient Instructions (Signed)
Thank you for your visit today.  I think your chronic cough may be due to allergies.  I have sent a prescription for zyrtec 10mg  to your pharmacy.  Please take this daily and follow up with Dr. Eula Fried. I have discontinued your nexium and sent the prescription for prilosec to your pharmacy at your request.  You may continue to take nexium until you run out of pills. Continue present plan and take all medications as prescribed.    Please be sure to bring all of your medications with you to every visit; this includes herbal supplements, vitamins, eye drops, and any over-the-counter medications.   Should you have any questions regarding your medications and/or any new or worsening symptoms, please be sure to call the clinic at [phone].   If you believe that you are suffering from a life threatening condition or one that may result in the loss of limb or function, then you should call 911 or proceed to the nearest Emergency Department.    A healthy lifestyle and preventative care can promote health and wellness.   Maintain regular health, dental, and eye exams.  Eat a healthy diet. Foods like vegetables, fruits, whole grains, low-fat dairy products, and lean protein foods contain the nutrients you need without too many calories. Decrease your intake of foods high in solid fats, added sugars, and salt. Get information about a proper diet from your caregiver, if necessary.  Regular physical exercise is one of the most important things you can do for your health. Most adults should get at least 150 minutes of moderate-intensity exercise (any activity that increases your heart rate and causes you to sweat) each week. In addition, most adults need muscle-strengthening exercises on 2 or more days a week.   Maintain a healthy weight. The body mass index (BMI) is a screening tool to identify possible weight problems. It provides an estimate of body fat based on height and weight. Your caregiver can  help determine your BMI, and can help you achieve or maintain a healthy weight. For adults 20 years and older:  A BMI below 18.5 is considered underweight.  A BMI of 18.5 to 24.9 is normal.  A BMI of 25 to 29.9 is considered overweight.  A BMI of 30 and above is considered obese.

## 2014-08-07 NOTE — Progress Notes (Signed)
INTERNAL MEDICINE TEACHING ATTENDING ADDENDUM - Manuel Dall, MD: I reviewed and discussed at the time of visit with the resident Dr. Gill, the patient's medical history, physical examination, diagnosis and results of pertinent tests and treatment and I agree with the patient's care as documented.  

## 2014-09-14 ENCOUNTER — Encounter: Payer: Self-pay | Admitting: *Deleted

## 2014-10-02 ENCOUNTER — Ambulatory Visit (INDEPENDENT_AMBULATORY_CARE_PROVIDER_SITE_OTHER): Payer: Medicaid Other | Admitting: Internal Medicine

## 2014-10-02 ENCOUNTER — Encounter: Payer: Self-pay | Admitting: Internal Medicine

## 2014-10-02 VITALS — BP 148/90 | HR 75 | Temp 97.8°F | Wt 186.5 lb

## 2014-10-02 DIAGNOSIS — Z23 Encounter for immunization: Secondary | ICD-10-CM

## 2014-10-02 DIAGNOSIS — H6123 Impacted cerumen, bilateral: Secondary | ICD-10-CM

## 2014-10-02 DIAGNOSIS — R05 Cough: Secondary | ICD-10-CM

## 2014-10-02 DIAGNOSIS — I10 Essential (primary) hypertension: Secondary | ICD-10-CM

## 2014-10-02 DIAGNOSIS — E1140 Type 2 diabetes mellitus with diabetic neuropathy, unspecified: Secondary | ICD-10-CM

## 2014-10-02 DIAGNOSIS — R053 Chronic cough: Secondary | ICD-10-CM

## 2014-10-02 DIAGNOSIS — E1149 Type 2 diabetes mellitus with other diabetic neurological complication: Secondary | ICD-10-CM

## 2014-10-02 DIAGNOSIS — Z0000 Encounter for general adult medical examination without abnormal findings: Secondary | ICD-10-CM

## 2014-10-02 LAB — POCT GLYCOSYLATED HEMOGLOBIN (HGB A1C): Hemoglobin A1C: 11

## 2014-10-02 LAB — GLUCOSE, CAPILLARY: GLUCOSE-CAPILLARY: 125 mg/dL — AB (ref 70–99)

## 2014-10-02 MED ORDER — CARBAMIDE PEROXIDE 6.5 % OT SOLN: 5.0000 [drp] | Freq: Two times a day (BID) | OTIC | Status: DC

## 2014-10-02 MED ORDER — LOSARTAN POTASSIUM 100 MG PO TABS: 100.0000 mg | ORAL_TABLET | Freq: Every day | ORAL | Status: DC

## 2014-10-02 NOTE — Patient Instructions (Addendum)
General Instructions:  Please bring your medicines with you each time you come to clinic.  Medicines may include prescription medications, over-the-counter medications, herbal remedies, eye drops, vitamins, or other pills.  Your a1c is VERY high today. You have asked for your insulin to not be changed. Instead we will watch you very closely for one month.  Please check your blood sugars three times a day and call us with your values every week and we will adjust insulin if needed. We may need to add novolog.  We have increased your blood pressure medication losartan from 50mg  to 100mg  daily. i have called the 100mg  tablet (one daily) prescription in to the pharmacy.   Return to see me in one month  Please go get your eyes check.  Bring your meter to each visit.   Progress Toward Treatment Goals:  Treatment Goal 10/02/2014  Hemoglobin A1C deteriorated  Blood pressure deteriorated    Self Care Goals & Plans:  Self Care Goal 07/03/2014  Manage my medications bring my medications to every visit  Monitor my health check my feet daily; bring my glucose meter and log to each visit; keep track of my blood pressure  Eat healthy foods eat foods that are low in salt; eat more vegetables; eat baked foods instead of fried foods  Be physically active find an activity I enjoy; find time in my schedule    Home Blood Glucose Monitoring 10/02/2014  Check my blood sugar -  When to check my blood sugar before breakfast; before dinner     Care Management & Community Referrals:  Referral 05/09/2013  Referrals made for care management support none needed

## 2014-10-02 NOTE — Progress Notes (Signed)
Subjective:   Patient ID: Nancy [name] female   DOB: 06-Oct-1956 58 y.o.   MRN: [id number]  HPI: Nancy [name] is a 58 y.o. female with PMH as listed below presenting to opc today for routine visit.   DM2--checked a1c today, 7.3 05/2014 now up to 11!  She admits to significant dietary indiscretions but after hearing her A1C today she is serious about getting back on track.  Her mother is present with her today who will also assist in diet modifications. She is eating very high carbohydrate meals but does not eat out much. Adherent with medications but does not want to make any changes to therapy at this time, because she feels like she can improve it on her own.  Did not bring her meter today.  Due to stress from the recent passing of her father, she says she really stopped caring about her blood sugar but is back on track now.   Flu vaccine today  HTN: 148/90 today. On losartan 36m qd and hctz 239mdaily. She did take her medications today.   Ear wax--b/l ears. Has not used anything to clean out her ears. Occasional itching and feels like they are full.   Past Medical History  Diagnosis Date  . Diabetic peripheral neuropathy   . Carpal tunnel syndrome, bilateral     confirmed on nerve conduction studies  . Hyperlipidemia   . Diabetes mellitus type 2, uncontrolled   . GERD (gastroesophageal reflux disease)   . Heat edema   . History of Helicobacter infection 11/07  . History of chest pain     negative cardiolite September 2007.  ETT-myoview 12/10 with EF 76%, no ischemia,   . History of endometriosis   . Carpal tunnel syndrome    Current Outpatient Prescriptions  Medication Sig Dispense Refill  . ACCU-CHEK FASTCLIX LANCETS MISC Use to check blood sugar up to 3 times a day DX code 250.02 insulin requiring  102 each  5  . aspirin 81 MG tablet Take 1 tablet (81 mg total) by mouth daily.      . Blood Glucose Monitoring Suppl (ACCU-CHEK NANO SMARTVIEW) W/DEVICE KIT Use to check  blood sugar up to 3 times a day DX code 250.02 insulin requiring  1 kit  0  . cetirizine (ZYRTEC) 10 MG tablet Take 1 tablet (10 mg total) by mouth daily.  30 tablet  1  . glucose blood (ACCU-CHEK SMARTVIEW) test strip Use to check blood sugar up to 3 times a day DX code 250.02 insulin requiring  100 each  5  . hydrochlorothiazide (HYDRODIURIL) 25 MG tablet Take 1 tablet (25 mg total) by mouth daily.  30 tablet  11  . insulin glargine (LANTUS) 100 UNIT/ML injection Inject 0.58 mLs (58 Units total) into the skin every morning. When you wake up  30 mL  10  . losartan (COZAAR) 50 MG tablet Take 1 tablet (50 mg total) by mouth daily.  30 tablet  5  . metFORMIN (GLUCOPHAGE) 1000 MG tablet Take 1 tablet (1,000 mg total) by mouth 2 (two) times daily with a meal.  60 tablet  11  . omeprazole (PRILOSEC) 20 MG capsule Take 1 capsule (20 mg total) by mouth daily.  30 capsule  3   No current facility-administered medications for this visit.   Family History  Problem Relation Age of Onset  . Diabetes Mother   . Colon cancer Neg Hx   . Lung cancer Maternal Uncle   . Breast cancer  Maternal Grandmother   . Liver cancer Maternal Grandfather    History   Social History  . Marital Status: Single    Spouse Name: N/A    Number of Children: 0  . Years of Education: 11   Occupational History  . CNA    Social History Main Topics  . Smoking status: Never Smoker   . Smokeless tobacco: Never Used  . Alcohol Use: No  . Drug Use: No  . Sexual Activity: None   Other Topics Concern  . None   Social History Narrative   Financial assistance approved for 100% discount at [facility] and has [facility] card per [employer]   09/26/2010   Review of Systems:  Constitutional:  Good appetite change  HEENT:  Denies congestion. Ear wax  Respiratory:  Dry intermittent cough  Cardiovascular:  Denies chest pain  Gastrointestinal:  Denies nausea, vomiting, abdominal pain  Genitourinary:  Denies dysuria  Musculoskeletal:   Denies gait problem.   Skin:  Denies foot ulcers  Neurological:  Denies headaches.    Objective:  Physical Exam: Filed Vitals:   10/02/14 1356  BP: 148/90  Pulse: 75  Temp: 97.8 F (36.6 C)  TempSrc: Oral  Weight: 186 lb 8 oz (84.596 kg)  SpO2: 100%   Vitals reviewed. General: sitting in chair, NAD HEENT: EOMI, PERRL, b/l ears with hardened cerumen visible limiting visibility. No major tenderness on examination but does endorse some discomfort/feeling like they are full Cardiac: RRR Pulm: clear to auscultation bilaterally, no wheezes, rales, or rhonchi Abd: soft, obese, nontender, BS present Ext: warm and well perfused, moving all 4 extremities +2DP B/L Neuro: alert and oriented X3, strength equal in bilateral upper and lower extremities  Assessment & Plan:  Discussed with Dr. Eppie Gibson

## 2014-10-03 ENCOUNTER — Telehealth: Payer: Self-pay | Admitting: *Deleted

## 2014-10-03 DIAGNOSIS — Z0000 Encounter for general adult medical examination without abnormal findings: Secondary | ICD-10-CM | POA: Insufficient documentation

## 2014-10-03 DIAGNOSIS — H6120 Impacted cerumen, unspecified ear: Secondary | ICD-10-CM | POA: Insufficient documentation

## 2014-10-03 NOTE — Assessment & Plan Note (Signed)
Ongoing for several months. Initially thought to be secondary to ACEi that was discontinued in June. Since then, cough has improved but still present occasionally, but apparently is getting better over months. Currently on ARB. She does have GERD and not always complaint with PPI, which could be other cause for cough.   -stressed 3 strict months of taking PPI daily -if cough still persistent despite strict adherence to prilosec, may need to consider transitioning from ARB as well.

## 2014-10-03 NOTE — Assessment & Plan Note (Signed)
BP Readings from Last 3 Encounters:  10/02/14 148/90  08/02/14 143/92  07/03/14 135/85   Lab Results  Component Value Date   NA 135 08/21/2013   K 3.7 08/21/2013   CREATININE 0.69 08/21/2013   Assessment: Blood pressure control: mildly elevated Progress toward BP goal:  deteriorated Comments: compliant with losartan and hctz  Plan: Medications:  continue current medications hctz 25mg  and will increase cozaar from 50mg  to 100mg  daily Educational resources provided:   Self management tools provided:   Other plans: recheck 1 month

## 2014-10-03 NOTE — Progress Notes (Signed)
Case discussed with Dr. Qureshi soon after the resident saw the patient.  We reviewed the resident's history and exam and pertinent patient test results.  I agree with the assessment, diagnosis, and plan of care documented in the resident's note. 

## 2014-10-03 NOTE — Assessment & Plan Note (Signed)
a1c rechecked today Flu vaccine given today

## 2014-10-03 NOTE — Telephone Encounter (Signed)
Received PA request from pt's pharmacy for her Losartan 100mg  tabs.  Medication is preferred after trial and failure of an ACE, which pt is unable to tolerate.  Request submitted online via [place], request approved, notice sent to pharmacy.Despina Hidden Cassady10/28/201511:52 AM   HQ:[id number] [place] 10/03/2014 - 10/03/2015

## 2014-10-03 NOTE — Assessment & Plan Note (Signed)
Has not been able to clean her ears in quite sometime, not using cutips. Feels like they are clogged up and itchy. Difficult to visualize ear canal due to significant hardened wax in b/l ears.   Offered ear irrigation in opc today but she refused preferring drops  -debrox drops to bilateral ears

## 2014-10-03 NOTE — Assessment & Plan Note (Signed)
Lab Results  Component Value Date   HGBA1C 11.0 10/02/2014   HGBA1C 7.3 05/11/2014   HGBA1C 7.6 01/16/2014    Assessment: Diabetes control: poor control (HgbA1C >9%) Progress toward A1C goal:  deteriorated Comments: increased stress and poor diet, serious about improving  Plan: Medications:  continue current medications Lantus 58 units qd and metformin 1000mg  bid Home glucose monitoring: Frequency:   Timing: before breakfast;before dinner Instruction/counseling given: reminded to bring blood glucose meter & log to each visit, reminded to bring medications to each visit, discussed the need for weight loss and discussed diet Educational resources provided:   Self management tools provided:   Other plans: call each week with her blood sugars, may need to add novolog for meal coverage as well, return in 1 month WITH meter. May need to meet with CDE again if she agrees and if sugars not improving. She does not wish to make any change in lantus dose at this time.

## 2014-10-09 ENCOUNTER — Telehealth: Payer: Self-pay | Admitting: Internal Medicine

## 2014-10-09 NOTE — Telephone Encounter (Signed)
I received a notice from Lela to call Nancy [name]. No answer, I left a message for her to call back if still needed.

## 2014-10-16 ENCOUNTER — Telehealth: Payer: Self-pay | Admitting: Dietician

## 2014-10-16 LAB — HM DIABETES EYE EXAM

## 2014-10-16 NOTE — Telephone Encounter (Signed)
Called patient to see if I could offer her assistance with her diabetes self care. She asked to return call. (Saw Dr. Peter Garter and he told her her eyes were fine. )

## 2014-10-16 NOTE — Telephone Encounter (Signed)
I received a message from triage that patient wishes to discuss blood sugars. Called back but no answer. Left another voicemail. She is encouraged to discuss with triage in case we keep missing each other.

## 2014-10-17 NOTE — Telephone Encounter (Signed)
Note from D. Plyler 10/17/14 sent to Dr Eula Fried.

## 2014-10-17 NOTE — Telephone Encounter (Signed)
Thank you. Please discuss option of possible other medications with her. I know we mentioned prandin vs. Novolog. Hopefully her cbg's can improve.

## 2014-10-17 NOTE — Telephone Encounter (Signed)
Spoke with patient about her blood sugars/diabetes self care. Made some suggestions for lower carb, lower GI foods.  She says stress of loss of her father and ill mother was affecting her self care.   88-97 fasting today and 150 after meals, Says her fasrign are higher( 200s)  when she eats after dinner at night.  Reports that food is not making her feel full anymore. .Highest was 400 after cold cereal and a fastring of 252. Other blood sugars  [phone]. Reports adherence with 58 units of lantus - never misses, 2 pills of metformin twice a day,  Her meter averages are :  7 day ave  n=16 - 217,  14 day ave  199, 30 day ave 196  She declined appointment. However, she did agree to call next week after trying out some of the things we discussed to tell us how she is doing. Plan to request her meter averages again next week to assist her with leanring how to obtain and then use this self management information to self monitor her progress

## 2014-10-24 ENCOUNTER — Telehealth: Payer: Self-pay | Admitting: Dietician

## 2014-10-24 ENCOUNTER — Encounter: Payer: Self-pay | Admitting: *Deleted

## 2014-10-24 NOTE — Telephone Encounter (Signed)
Patient called to report that she is doing better, has her diabetes under better control. Blood sugars for the past week have been - [id number]  Before breakfast, after breakfast, before dinner and after dinner Meter averages: 7 day- 143, 14 day-173, 30 day- 176- much improved. CDE discussed adding diabetes medicine to help her correct high blood sugars when needed such as Prandin or Novolog ( another tool in her shed of tools to care for her diabetes). She says Novolog scares her and that she'd talk it over with her mother and call us in the next few days to let us know her thoughts. Reminded her that she has an appointment with Dr. Eula Fried on 11/06/14.

## 2014-10-24 NOTE — Telephone Encounter (Signed)
Those are much better thank you!

## 2014-11-06 ENCOUNTER — Encounter: Payer: Self-pay | Admitting: Internal Medicine

## 2014-11-06 ENCOUNTER — Ambulatory Visit (INDEPENDENT_AMBULATORY_CARE_PROVIDER_SITE_OTHER): Payer: Medicaid Other | Admitting: Internal Medicine

## 2014-11-06 ENCOUNTER — Ambulatory Visit (HOSPITAL_COMMUNITY)
Admission: RE | Admit: 2014-11-06 | Discharge: 2014-11-06 | Disposition: A | Discharge status: Home / Self Care | Payer: Medicaid Other | Source: Ambulatory Visit | Attending: Internal Medicine | Admitting: Internal Medicine

## 2014-11-06 VITALS — BP 137/90 | HR 82 | Temp 98.5°F | Ht 59.0 in | Wt 184.3 lb

## 2014-11-06 DIAGNOSIS — E1140 Type 2 diabetes mellitus with diabetic neuropathy, unspecified: Secondary | ICD-10-CM

## 2014-11-06 DIAGNOSIS — R0789 Other chest pain: Secondary | ICD-10-CM

## 2014-11-06 DIAGNOSIS — E1149 Type 2 diabetes mellitus with other diabetic neurological complication: Secondary | ICD-10-CM

## 2014-11-06 DIAGNOSIS — I10 Essential (primary) hypertension: Secondary | ICD-10-CM

## 2014-11-06 DIAGNOSIS — E785 Hyperlipidemia, unspecified: Secondary | ICD-10-CM

## 2014-11-06 DIAGNOSIS — R1310 Dysphagia, unspecified: Secondary | ICD-10-CM

## 2014-11-06 LAB — LIPID PANEL
CHOLESTEROL: 164 mg/dL (ref 0–200)
HDL: 33 mg/dL — AB (ref >39)
LDL Cholesterol: 101 mg/dL — ABNORMAL HIGH (ref 0–99)
Total CHOL/HDL Ratio: 5 Ratio
Triglycerides: 148 mg/dL (ref <150)
VLDL: 30 mg/dL (ref 0–40)

## 2014-11-06 LAB — GLUCOSE, CAPILLARY: GLUCOSE-CAPILLARY: 91 mg/dL (ref 70–99)

## 2014-11-06 MED ORDER — LOSARTAN POTASSIUM 100 MG PO TABS: 50.0000 mg | ORAL_TABLET | Freq: Every day | ORAL | Status: DC

## 2014-11-06 NOTE — Patient Instructions (Addendum)
General Instructions:  Please bring your medicines with you each time you come to clinic.  Medicines may include prescription medications, over-the-counter medications, herbal remedies, eye drops, vitamins, or other pills.  Please continue to take your medications, restart your losartan at 50mg  daily and continue hctz  Great job on your diabetes and weight loss, keep up the good work! We will check your a1c next week  Any chest pain, sob, sweating, feeling faint, call us right away or go directly to ED  If you have more trouble swallowing please let me know right away. We will try to get your past records  Progress Toward Treatment Goals:  Treatment Goal 11/06/2014  Hemoglobin A1C (No Data)  Blood pressure improved    Self Care Goals & Plans:  Self Care Goal 11/06/2014  Manage my medications take my medicines as prescribed; bring my medications to every visit; refill my medications on time  Monitor my health check my feet daily; bring my glucose meter and log to each visit; keep track of my blood glucose  Eat healthy foods eat more vegetables; eat foods that are low in salt; eat baked foods instead of fried foods  Be physically active find an activity I enjoy; take a walk every day    Home Blood Glucose Monitoring 11/06/2014  Check my blood sugar 3 times a day  When to check my blood sugar before meals     Care Management & Community Referrals:  Referral 05/09/2013  Referrals made for care management support none needed

## 2014-11-06 NOTE — Assessment & Plan Note (Signed)
Esophageal location per description. Only with solids and mainly meat. Ongoing x1 week. Admits to not chewing food properly that could be reason. Denies any pain. Noted to have barium swallow and EGD 2012 but I do not see those records at this time and she does not recall why she had the tests and the findings. No trouble with liquids.   -advised to slow down when eating and chew food properly before swallowing -will try to obtain records from prior studies and review findings -if complaints continue or worsen, advised to let me know right away, will need GI referral. Has seen them in the past.

## 2014-11-06 NOTE — Assessment & Plan Note (Signed)
- 

## 2014-11-06 NOTE — Assessment & Plan Note (Signed)
BP Readings from Last 3 Encounters:  11/06/14 137/90  10/02/14 148/90  08/02/14 143/92   Lab Results  Component Value Date   NA 135 08/21/2013   K 3.7 08/21/2013   CREATININE 0.69 08/21/2013   Assessment: Blood pressure control: mildly elevated Progress toward BP goal:  improved Comments: did not like taking losartan 100mg  due to palpitations but tolerates 50mg  without complaints. Has not taken losartan for past 2 days.   Plan: Medications:  continue current medications hctz 25mg  and restart losartan 50mg  daily

## 2014-11-06 NOTE — Progress Notes (Signed)
Subjective:   Patient ID: Nancy [name] female   DOB: April 05, 1956 58 y.o.   MRN: [id number]  HPI: [name] is a 58 y.o. female with uncontrolled DM2 and HTN presenting to opc today for BP follow up visit.   HTN: Cozaar increased from 50-183m last visit 10/28 and continued on HCTZ 255mdaily. However, she felt like her heart was racing with increase dose of cozaar so she has not taken it for the past 2 days but BP almost at goal today despite not taking her medicine recently. She has been taking HCTZ daily as prescribed however. She wishes to go back down to 50104mose of Cozaar that she tolerated without complaints and now that she is working on her diet and lifestyle, hopefully BP will continue to improve.   DM2--uncontrolled, last a1c 11 09/2014, recheck end of December. Recent telephone encounter notes improvement in CBGs as she has been working on it. She is very happy with her CBG control, most values well under 200, no hypoglycemic episodes and has also lost ~2 pounds. She plans to keep this up.   Dysphagia--past week notes feeling like her food get stuck mid throat. Admits to not chewing her food properly and only with solid food especially meat. No trouble swallowing liquids. Denies regurgitation of food and eventually the food goes down she says. No pain with swallowing or sore throat.  Apparently had similar complaints in 2012 when she had a barium swallow and maybe EGD but she does not recall where and when this happened and why.   Chest pain--left breast in specific point, tender to palpation, lasting several days, sharp but uncomfortable but bearable. Spontaneously resolved on its own. Denied any corresponding diaphoresis, radiation of pain, syncope or SOB. Did not want to go to the ED. No chest pain at this time. Felt maybe it wad indigestion as it happened around thanksgiving with lots of eating.   Past Medical History  Diagnosis Date  . Diabetic peripheral neuropathy     . Carpal tunnel syndrome, bilateral     confirmed on nerve conduction studies  . Hyperlipidemia   . Diabetes mellitus type 2, uncontrolled   . GERD (gastroesophageal reflux disease)   . Heat edema   . History of Helicobacter infection 11/07  . History of chest pain     negative cardiolite September 2007.  ETT-myoview 12/10 with EF 76%, no ischemia,   . History of endometriosis   . Carpal tunnel syndrome    Current Outpatient Prescriptions  Medication Sig Dispense Refill  . ACCU-CHEK FASTCLIX LANCETS MISC Use to check blood sugar up to 3 times a day DX code 250.02 insulin requiring 102 each 5  . aspirin 81 MG tablet Take 1 tablet (81 mg total) by mouth daily.    . Blood Glucose Monitoring Suppl (ACCU-CHEK NANO SMARTVIEW) W/DEVICE KIT Use to check blood sugar up to 3 times a day DX code 250.02 insulin requiring 1 kit 0  . carbamide peroxide (DEBROX) 6.5 % otic solution Place 5 drops into both ears 2 (two) times daily. 15 mL 0  . cetirizine (ZYRTEC) 10 MG tablet Take 1 tablet (10 mg total) by mouth daily. 30 tablet 1  . glucose blood (ACCU-CHEK SMARTVIEW) test strip Use to check blood sugar up to 3 times a day DX code 250.02 insulin requiring 100 each 5  . hydrochlorothiazide (HYDRODIURIL) 25 MG tablet Take 1 tablet (25 mg total) by mouth daily. 30 tablet 11  . insulin glargine (LANTUS)  100 UNIT/ML injection Inject 0.58 mLs (58 Units total) into the skin every morning. When you wake up 30 mL 10  . losartan (COZAAR) 100 MG tablet Take 1 tablet (100 mg total) by mouth daily. 30 tablet 1  . metFORMIN (GLUCOPHAGE) 1000 MG tablet Take 1 tablet (1,000 mg total) by mouth 2 (two) times daily with a meal. 60 tablet 11  . omeprazole (PRILOSEC) 20 MG capsule Take 1 capsule (20 mg total) by mouth daily. 30 capsule 3   No current facility-administered medications for this visit.   Family History  Problem Relation Age of Onset  . Diabetes Mother   . Colon cancer Neg Hx   . Lung cancer Maternal Uncle    . Breast cancer Maternal Grandmother   . Liver cancer Maternal Grandfather    History   Social History  . Marital Status: Single    Spouse Name: N/A    Number of Children: 0  . Years of Education: 11   Occupational History  . CNA    Social History Main Topics  . Smoking status: Never Smoker   . Smokeless tobacco: Never Used  . Alcohol Use: No  . Drug Use: No  . Sexual Activity: Not on file   Other Topics Concern  . Not on file   Social History Narrative   Financial assistance approved for 100% discount at [facility] and has [facility] card per [employer]   09/26/2010   Review of Systems:  Constitutional:  Denies fever, chills  HEENT:  Denies congestion  Respiratory:  Denies SOB and wheezing.   Cardiovascular:  Denies chest pain  Gastrointestinal:  Denies nausea, vomiting, abdominal pain  Genitourinary:  Denies dysuria  Musculoskeletal:  Denies gait problem. Wrist pain b/l chronic   Objective:  Physical Exam: Filed Vitals:   11/06/14 1316  BP: 137/90  Pulse: 82  Temp: 98.5 F (36.9 C)  TempSrc: Oral  Height: '4\' 11"'  (1.499 m)  Weight: 184 lb 4.8 oz (83.598 kg)  SpO2: 100%   Vitals reviewed. General: sitting in chair, NAD HEENT: EOMI Cardiac: RRR Chest: tenderness to palpation anterior chest left breast mid medial surface Pulm: clear to auscultation bilaterally, no wheezes, rales, or rhonchi Abd: soft, obese, BS present Ext: warm and well perfused, no pedal edema, moving all 4 extremities Neuro: alert and oriented X3, strength and sensation to light touch equal in bilateral upper and lower extremities  EKG: NSR 78bpm, t wave flattening in lead III  Assessment & Plan:  Discussed with Dr. Dareen Piano

## 2014-11-07 NOTE — Progress Notes (Signed)
INTERNAL MEDICINE TEACHING ATTENDING ADDENDUM - Stephon Weathers, MD: I reviewed and discussed at the time of visit with the resident Dr. Qureshi, the patient's medical history, physical examination, diagnosis and results of pertinent tests and treatment and I agree with the patient's care as documented.  

## 2014-11-07 NOTE — Assessment & Plan Note (Signed)
Lab Results  Component Value Date   HGBA1C 11.0 10/02/2014   HGBA1C 7.3 05/11/2014   HGBA1C 7.6 01/16/2014   Assessment: Diabetes control: poor control (HgbA1C >9%) Progress toward A1C goal:   (cbgs improving) Comments: CBG MUCH improved with average 152, no hypoglycemia, she has been working hard on diet and has lost 2 pounds as well  Plan: Medications:  continue current medications lantus 58 units and metformin 1000mg  bid Home glucose monitoring: Frequency: 3 times a day Timing: before meals Instruction/counseling given: reminded to bring blood glucose meter & log to each visit, reminded to bring medications to each visit, discussed the need for weight loss and discussed diet Educational resources provided:   Self management tools provided: copy of home glucose meter download Other plans: recheck a1c on next visit with me

## 2014-11-07 NOTE — Assessment & Plan Note (Signed)
Reports atypical chest pain on left breast during thanksgiving that lasted several days, non radiating but uncomfortable. Did not want to go to the hospital now resolved.  No diaphoresis or syncope during episode. Felt maybe like indigestion and was tender to palpation and area remains TTP today as well. Likely MSK vs. 2/2 GERD. EKG done in opc with NSR similar to prior with some t wave flattening in lead III this time and non-specific.   Will continue to monitor for now Counseled her on warning signs of chest pain including but not limited to sudden sharp substernal or left sided pain, radiating to jaw or arms, nausea, vomiting, abdominal pain, diaphoresis, syncope, sob to go directly to the emergency room right away which she understood.  Risk factors include DM and HTN.

## 2014-12-10 ENCOUNTER — Encounter: Payer: Self-pay | Admitting: Internal Medicine

## 2014-12-10 ENCOUNTER — Ambulatory Visit (INDEPENDENT_AMBULATORY_CARE_PROVIDER_SITE_OTHER): Payer: Medicaid Other | Admitting: Internal Medicine

## 2014-12-10 ENCOUNTER — Other Ambulatory Visit (INDEPENDENT_AMBULATORY_CARE_PROVIDER_SITE_OTHER): Payer: Medicaid Other

## 2014-12-10 VITALS — BP 131/86 | HR 79 | Temp 97.6°F | Ht 59.0 in | Wt 177.7 lb

## 2014-12-10 DIAGNOSIS — L720 Epidermal cyst: Secondary | ICD-10-CM

## 2014-12-10 DIAGNOSIS — E1149 Type 2 diabetes mellitus with other diabetic neurological complication: Secondary | ICD-10-CM

## 2014-12-10 DIAGNOSIS — I10 Essential (primary) hypertension: Secondary | ICD-10-CM

## 2014-12-10 DIAGNOSIS — E1140 Type 2 diabetes mellitus with diabetic neuropathy, unspecified: Secondary | ICD-10-CM

## 2014-12-10 LAB — GLUCOSE, CAPILLARY: GLUCOSE-CAPILLARY: 161 mg/dL — AB (ref 70–99)

## 2014-12-10 LAB — POCT GLYCOSYLATED HEMOGLOBIN (HGB A1C): Hemoglobin A1C: 6.7

## 2014-12-10 NOTE — Assessment & Plan Note (Signed)
-  check HA1c, urine microalbumin

## 2014-12-10 NOTE — Assessment & Plan Note (Signed)
-  continue current meds  

## 2014-12-10 NOTE — Assessment & Plan Note (Addendum)
Pt p/w a "cyst" on her lower right scalp that has been present for several months but recently enlarged.  Also states it is now painful to touch.  Never had anything like this before.  Does have DM.  Denies fever/chills, N/V, or other systemic s/s.  On physical exam, the cyst is hard and measures ~4.5 x 2cm.  No warmth, erythema, or drainage.  No surrounding erythema.  Although it is tender to palpation.  Has tried neosporin which has not helped.   -referral to general surgery given location

## 2014-12-10 NOTE — Patient Instructions (Signed)
Thank you for your visit today.   Please return to the internal medicine clinic as needed at your regularly scheduled appointment with your primary physician.    Your current medical regimen is effective;  continue present plan and take all medications as prescribed.    You likely have an epidermoid cyst which will require [place] to refer you to general surgery.    Please be sure to bring all of your medications with you to every visit; this includes herbal supplements, vitamins, eye drops, and any over-the-counter medications.   Should you have any questions regarding your medications and/or any new or worsening symptoms, please be sure to call the clinic at [phone].   If you believe that you are suffering from a life threatening condition or one that may result in the loss of limb or function, then you should call 911 or proceed to the nearest Emergency Department.     Epidermal Cyst An epidermal cyst is sometimes called a sebaceous cyst, epidermal inclusion cyst, or infundibular cyst. These cysts usually contain a substance that looks "pasty" or "cheesy" and may have a bad smell. This substance is a protein called keratin. Epidermal cysts are usually found on the face, neck, or trunk. They may also occur in the vaginal area or other parts of the genitalia of both men and women. Epidermal cysts are usually small, painless, slow-growing bumps or lumps that move freely under the skin. It is important not to try to pop them. This may cause an infection and lead to tenderness and swelling. CAUSES  Epidermal cysts may be caused by a deep penetrating injury to the skin or a plugged hair follicle, often associated with acne. SYMPTOMS  Epidermal cysts can become inflamed and cause:  Redness.  Tenderness.  Increased temperature of the skin over the bumps or lumps.  Grayish-white, bad smelling material that drains from the bump or lump. DIAGNOSIS  Epidermal cysts are easily diagnosed by  your caregiver during an exam. Rarely, a tissue sample (biopsy) may be taken to rule out other conditions that may resemble epidermal cysts. TREATMENT   Epidermal cysts often get better and disappear on their own. They are rarely ever cancerous.  If a cyst becomes infected, it may become inflamed and tender. This may require opening and draining the cyst. Treatment with antibiotics may be necessary. When the infection is gone, the cyst may be removed with minor surgery.  Small, inflamed cysts can often be treated with antibiotics or by injecting steroid medicines.  Sometimes, epidermal cysts become large and bothersome. If this happens, surgical removal in your caregiver's office may be necessary. HOME CARE INSTRUCTIONS  Only take over-the-counter or prescription medicines as directed by your caregiver.  Take your antibiotics as directed. Finish them even if you start to feel better. SEEK MEDICAL CARE IF:   Your cyst becomes tender, red, or swollen.  Your condition is not improving or is getting worse.  You have any other questions or concerns. MAKE SURE YOU:  Understand these instructions.  Will watch your condition.  Will get help right away if you are not doing well or get worse. Document Released: 10/24/2004 Document Revised: 02/15/2012 Document Reviewed: 06/01/2011 [facility] Patient Information 2015 [place], [place]. This information is not intended to replace advice given to you by your health care provider. Make sure you discuss any questions you have with your health care provider.

## 2014-12-10 NOTE — Progress Notes (Signed)
Patient ID: Nancy [name], female   DOB: 03/16/1956, 59 y.o.   MRN: [id number]    Subjective:   Patient ID: Nancy [name] female    DOB: [birth year] 59 y.o.    MRN: [id number] Health Maintenance Due: Health Maintenance Due  Topic Date Due  . URINE MICROALBUMIN  11/17/2014    _________________________________________________  HPI: [name] is a 59 y.o. female here for an acute visit. Pt has a PMH outlined below.  Please see problem-based charting assessment and plan note for further details of medical issues addressed at today's visit.  PMH: Past Medical History  Diagnosis Date  . Diabetic peripheral neuropathy   . Carpal tunnel syndrome, bilateral     confirmed on nerve conduction studies  . Hyperlipidemia   . Diabetes mellitus type 2, uncontrolled   . GERD (gastroesophageal reflux disease)   . Heat edema   . History of Helicobacter infection 11/07  . History of chest pain     negative cardiolite September 2007.  ETT-myoview 12/10 with EF 76%, no ischemia,   . History of endometriosis   . Carpal tunnel syndrome     Medications: Current Outpatient Prescriptions on File Prior to Visit  Medication Sig Dispense Refill  . ACCU-CHEK FASTCLIX LANCETS MISC Use to check blood sugar up to 3 times a day DX code 250.02 insulin requiring 102 each 5  . aspirin 81 MG tablet Take 1 tablet (81 mg total) by mouth daily.    . Blood Glucose Monitoring Suppl (ACCU-CHEK NANO SMARTVIEW) W/DEVICE KIT Use to check blood sugar up to 3 times a day DX code 250.02 insulin requiring 1 kit 0  . carbamide peroxide (DEBROX) 6.5 % otic solution Place 5 drops into both ears 2 (two) times daily. (Patient not taking: Reported on 11/06/2014) 15 mL 0  . glucose blood (ACCU-CHEK SMARTVIEW) test strip Use to check blood sugar up to 3 times a day DX code 250.02 insulin requiring 100 each 5  . hydrochlorothiazide (HYDRODIURIL) 25 MG tablet Take 1 tablet (25 mg total) by mouth daily. 30 tablet 11  .  insulin glargine (LANTUS) 100 UNIT/ML injection Inject 0.58 mLs (58 Units total) into the skin every morning. When you wake up 30 mL 10  . losartan (COZAAR) 100 MG tablet Take 0.5 tablets (50 mg total) by mouth daily. 30 tablet 1  . metFORMIN (GLUCOPHAGE) 1000 MG tablet Take 1 tablet (1,000 mg total) by mouth 2 (two) times daily with a meal. 60 tablet 11  . omeprazole (PRILOSEC) 20 MG capsule Take 1 capsule (20 mg total) by mouth daily. 30 capsule 3   No current facility-administered medications on file prior to visit.    Allergies: Allergies  Allergen Reactions  . Ace Inhibitors Cough    Persistent dry cough  . Amitriptyline Hcl Nausea Only    REACTION: Upset stomach    FH: Family History  Problem Relation Age of Onset  . Diabetes Mother   . Colon cancer Neg Hx   . Lung cancer Maternal Uncle   . Breast cancer Maternal Grandmother   . Liver cancer Maternal Grandfather     SH: History   Social History  . Marital Status: Single    Spouse Name: N/A    Number of Children: 0  . Years of Education: 11   Occupational History  . CNA    Social History Main Topics  . Smoking status: Never Smoker   . Smokeless tobacco: Never Used  . Alcohol Use: No  . Drug  Use: No  . Sexual Activity: None   Other Topics Concern  . None   Social History Narrative   Financial assistance approved for 100% discount at [facility] and has [facility] card per [employer]   09/26/2010    Review of Systems: Constitutional: Negative for fever, chills and weight loss.  Eyes: Negative for blurred vision.  Respiratory: Negative for cough and shortness of breath.  Cardiovascular: Negative for chest pain, palpitations and leg swelling.  Gastrointestinal: Negative for nausea, vomiting, abdominal pain, diarrhea, constipation and blood in stool.  Genitourinary: Negative for dysuria, urgency and frequency.  Musculoskeletal: Negative for myalgias and back pain.  Neurological: Negative for dizziness, weakness and  headaches.     Objective:   Vital Signs: Filed Vitals:   12/10/14 1403  BP: 131/86  Pulse: 79  Temp: 97.6 F (36.4 C)  TempSrc: Oral  Height: 4' 11" (1.499 m)  Weight: 80.604 kg (177 lb 11.2 oz)  SpO2: 100%      BP Readings from Last 3 Encounters:  12/10/14 131/86  11/06/14 137/90  10/02/14 148/90    Physical Exam: Constitutional: Vital signs reviewed.  Patient is well-developed and well-nourished in NAD and cooperative with exam.  Head: Normocephalic and atraumatic.  4.5 cm x 2.0 cm cyst on the lower right scalp.  No surrounding erythema, warmth, or drainage.   Eyes: PERRL, EOMI, conjunctivae nl, no scleral icterus.  Neck: Supple. Cardiovascular: RRR, no MRG. Pulmonary/Chest: normal effort, CTAB, no wheezes, rales, or rhonchi. Abdominal: Soft. NT/ND +BS. Neurological: A&O x3, cranial nerves II-XII are grossly intact, moving all extremities. Extremities: 2+DP b/l; no pitting edema. Skin: Warm, dry and intact.   Assessment & Plan:   Assessment and plan was discussed and formulated with my attending.

## 2014-12-11 LAB — MICROALBUMIN / CREATININE URINE RATIO
Creatinine, Urine: 129.8 mg/dL
Microalb Creat Ratio: 1216.5 mg/g — ABNORMAL HIGH (ref 0.0–30.0)
Microalb, Ur: 157.9 mg/dL — ABNORMAL HIGH (ref <2.0)

## 2014-12-11 NOTE — Progress Notes (Signed)
Internal Medicine Clinic Attending  I saw and evaluated the patient.  I personally confirmed the key portions of the history and exam documented by Dr. Gordy Levan and I reviewed pertinent patient test results.  The assessment, diagnosis, and plan were formulated together and I agree with the documentation in the resident's note.

## 2014-12-14 ENCOUNTER — Telehealth: Payer: Self-pay | Admitting: Internal Medicine

## 2014-12-14 DIAGNOSIS — I10 Essential (primary) hypertension: Secondary | ICD-10-CM

## 2014-12-14 MED ORDER — LOSARTAN POTASSIUM 100 MG PO TABS: 100.0000 mg | ORAL_TABLET | Freq: Every day | ORAL | Status: DC

## 2014-12-14 NOTE — Telephone Encounter (Signed)
I called and spoke with Nancy [name] this afternoon. I first congratulated her on her wonderful diabetic control and improved A1C. She was very happy and I encouraged her to keep up the good work.   We then discussed her worsening proteinuria as checked on recent office visit. She was previously taking cozaar 50mg  daily but says she has now increased it back up to 100mg  herself, which is was I hoped to do to see if we can  Help with the proteinuria.  Therefore, we will continue the 100mg  of Losartan for now and monitor her BP and proteinuria.   She also informed me that she is scheduled to return to opc next week to have a lesion on her leg checked out. She says it is on the side and she just wishes to have it examined. I will unfortunately not be in opc this month but she will be seen by another resident. In the meantime, she knows that if there is swelling or if it is painful or getting worse to call and let us know or if severe and if she has SOB, fever or other concerning symptoms to go to the emergency room. She voices understanding and thanks me for the call.

## 2014-12-18 ENCOUNTER — Ambulatory Visit (INDEPENDENT_AMBULATORY_CARE_PROVIDER_SITE_OTHER): Payer: Medicaid Other | Admitting: Internal Medicine

## 2014-12-18 ENCOUNTER — Encounter: Payer: Self-pay | Admitting: Internal Medicine

## 2014-12-18 VITALS — BP 138/82 | HR 73 | Temp 97.9°F | Ht 59.0 in | Wt 181.2 lb

## 2014-12-18 DIAGNOSIS — I10 Essential (primary) hypertension: Secondary | ICD-10-CM

## 2014-12-18 DIAGNOSIS — R600 Localized edema: Secondary | ICD-10-CM | POA: Insufficient documentation

## 2014-12-18 LAB — GLUCOSE, CAPILLARY: Glucose-Capillary: 141 mg/dL — ABNORMAL HIGH (ref 70–99)

## 2014-12-18 NOTE — Assessment & Plan Note (Addendum)
Pt reports having RLE swelling and pain for the past week since her last OV.  Denies any trauma, long distance travel, h/o DVT, h/o malignancy, warmth, or erythema of the LE.  She does report that her legs get tired usually by the end of the day.  On exam, she has 2+DP b/l, no asymmetrical swelling, warmth, or erythema.  LE tender to palpation b/l, R>L.  No palpable cords.  Denies fever/chills.  Does have a h/o DM which is controlled.  Wells criteria for DVT is zero.  No h/o CKD, HF, or cirrhosis.  Does have a h/o leg cramps according to chart review which would support chronic venous insufficiency.  Normal EKG in 11/2014.  No TTE in Epic.  Does not appear to be on any meds that may cause LE edema.  Normal ABIs. Likely chronic venous insufficiency.   -ordered TED hose for LE edema Regino Schultze will mail to the pt today)--> normal ABIs -advised to elevate legs as much as possible -will check LE dopplers to r/o DVT given acute swelling and pain reported by pt  -f/u with PCP

## 2014-12-18 NOTE — Assessment & Plan Note (Signed)
BP 132/82. -continue current meds

## 2014-12-18 NOTE — Patient Instructions (Signed)
Thank you for your visit today.   Please return to the internal medicine clinic as needed.  Follow up with primary doctor.  Your current medical regimen is effective;  continue present plan and take all medications as prescribed.  We will get lower extremity dopplers to look for a blood clot.    We will also give you some hose to wear to help your leg swelling.    Please be sure to bring all of your medications with you to every visit; this includes herbal supplements, vitamins, eye drops, and any over-the-counter medications.   Should you have any questions regarding your medications and/or any new or worsening symptoms, please be sure to call the clinic at [phone].   If you believe that you are suffering from a life threatening condition or one that may result in the loss of limb or function, then you should call 911 or proceed to the nearest Emergency Department.   A healthy lifestyle and preventative care can promote health and wellness.   Maintain regular health, dental, and eye exams.  Eat a healthy diet. Foods like vegetables, fruits, whole grains, low-fat dairy products, and lean protein foods contain the nutrients you need without too many calories. Decrease your intake of foods high in solid fats, added sugars, and salt. Get information about a proper diet from your caregiver, if necessary.  Regular physical exercise is one of the most important things you can do for your health. Most adults should get at least 150 minutes of moderate-intensity exercise (any activity that increases your heart rate and causes you to sweat) each week. In addition, most adults need muscle-strengthening exercises on 2 or more days a week.   Maintain a healthy weight. The body mass index (BMI) is a screening tool to identify possible weight problems. It provides an estimate of body fat based on height and weight. Your caregiver can help determine your BMI, and can help you achieve or maintain a  healthy weight. For adults 20 years and older:  A BMI below 18.5 is considered underweight.  A BMI of 18.5 to 24.9 is normal.  A BMI of 25 to 29.9 is considered overweight.  A BMI of 30 and above is considered obese.  Venous Stasis or Chronic Venous Insufficiency Chronic venous insufficiency, also called venous stasis, is a condition that affects the veins in the legs. The condition prevents blood from being pumped through these veins effectively. Blood may no longer be pumped effectively from the legs back to the heart. This condition can range from mild to severe. With proper treatment, you should be able to continue with an active life. CAUSES  Chronic venous insufficiency occurs when the vein walls become stretched, weakened, or damaged or when valves within the vein are damaged. Some common causes of this include:  High blood pressure inside the veins (venous hypertension).  Increased blood pressure in the leg veins from long periods of sitting or standing.  A blood clot that blocks blood flow in a vein (deep vein thrombosis).  Inflammation of a superficial vein (phlebitis) that causes a blood clot to form. RISK FACTORS Various things can make you more likely to develop chronic venous insufficiency, including:  Family history of this condition.  Obesity.  Pregnancy.  Sedentary lifestyle.  Smoking.  Jobs requiring long periods of standing or sitting in one place.  Being a certain age. Women in their 61s and 102s and men in their 64s are more likely to develop this condition. SIGNS  AND SYMPTOMS  Symptoms may include:   Varicose veins.  Skin breakdown or ulcers.  Reddened or discolored skin on the leg.  Brown, smooth, tight, and painful skin just above the ankle, usually on the inside surface (lipodermatosclerosis).  Swelling. DIAGNOSIS  To diagnose this condition, your health care provider will take a medical history and do a physical exam. The following tests may  be ordered to confirm the diagnosis:  Duplex ultrasound--A procedure that produces a picture of a blood vessel and nearby organs and also provides information on blood flow through the blood vessel.  Plethysmography--A procedure that tests blood flow.  A venogram, or venography--A procedure used to look at the veins using X-ray and dye. TREATMENT The goals of treatment are to help you return to an active life and to minimize pain or disability. Treatment will depend on the severity of the condition. Medical procedures may be needed for severe cases. Treatment options may include:   Use of compression stockings. These can help with symptoms and lower the chances of the problem getting worse, but they do not cure the problem.  Sclerotherapy--A procedure involving an injection of a material that "dissolves" the damaged veins. Other veins in the network of blood vessels take over the function of the damaged veins.  Surgery to remove the vein or cut off blood flow through the vein (vein stripping or laser ablation surgery).  Surgery to repair a valve. HOME CARE INSTRUCTIONS   Wear compression stockings as directed by your health care provider.  Only take over-the-counter or prescription medicines for pain, discomfort, or fever as directed by your health care provider.  Follow up with your health care provider as directed. SEEK MEDICAL CARE IF:   You have redness, swelling, or increasing pain in the affected area.  You see a red streak or line that extends up or down from the affected area.  You have a breakdown or loss of skin in the affected area, even if the breakdown is small.  You have an injury to the affected area. SEEK IMMEDIATE MEDICAL CARE IF:   You have an injury and open wound in the affected area.  Your pain is severe and does not improve with medicine.  You have sudden numbness or weakness in the foot or ankle below the affected area, or you have trouble moving your  foot or ankle.  You have a fever or persistent symptoms for more than 2-3 days.  You have a fever and your symptoms suddenly get worse. MAKE SURE YOU:   Understand these instructions.  Will watch your condition.  Will get help right away if you are not doing well or get worse. Document Released: 03/29/2007 Document Revised: 09/13/2013 Document Reviewed: 07/31/2013 [facility] Patient Information 2015 [place], [place]. This information is not intended to replace advice given to you by your health care provider. Make sure you discuss any questions you have with your health care provider.

## 2014-12-18 NOTE — Progress Notes (Signed)
Patient ID: Lucy Boardman, female   DOB: 1956-02-20, 59 y.o.   MRN: [id number]    Subjective:   Patient ID: Kama Cammarano female    DOB: 1956-06-06 59 y.o.    MRN: [id number] Health Maintenance Due: There are no preventive care reminders to display for this patient.  _________________________________________________  HPI: Ms.Chaundra Bennison is a 59 y.o. female here for an acute visit.  Pt has a PMH outlined below.  Please see problem-based charting assessment and plan note for further details of medical issues addressed at today's visit.  PMH: Past Medical History  Diagnosis Date  . Diabetic peripheral neuropathy   . Carpal tunnel syndrome, bilateral     confirmed on nerve conduction studies  . Hyperlipidemia   . Diabetes mellitus type 2, uncontrolled   . GERD (gastroesophageal reflux disease)   . Heat edema   . History of Helicobacter infection 11/07  . History of chest pain     negative cardiolite September 2007.  ETT-myoview 12/10 with EF 76%, no ischemia,   . History of endometriosis   . Carpal tunnel syndrome     Medications: Current Outpatient Prescriptions on File Prior to Visit  Medication Sig Dispense Refill  . ACCU-CHEK FASTCLIX LANCETS MISC Use to check blood sugar up to 3 times a day DX code 250.02 insulin requiring 102 each 5  . aspirin 81 MG tablet Take 1 tablet (81 mg total) by mouth daily.    . Blood Glucose Monitoring Suppl (ACCU-CHEK NANO SMARTVIEW) W/DEVICE KIT Use to check blood sugar up to 3 times a day DX code 250.02 insulin requiring 1 kit 0  . carbamide peroxide (DEBROX) 6.5 % otic solution Place 5 drops into both ears 2 (two) times daily. (Patient not taking: Reported on 11/06/2014) 15 mL 0  . glucose blood (ACCU-CHEK SMARTVIEW) test strip Use to check blood sugar up to 3 times a day DX code 250.02 insulin requiring 100 each 5  . hydrochlorothiazide (HYDRODIURIL) 25 MG tablet Take 1 tablet (25 mg total) by mouth daily. 30 tablet 11  . insulin  glargine (LANTUS) 100 UNIT/ML injection Inject 0.58 mLs (58 Units total) into the skin every morning. When you wake up 30 mL 10  . losartan (COZAAR) 100 MG tablet Take 1 tablet (100 mg total) by mouth daily. 30 tablet 1  . metFORMIN (GLUCOPHAGE) 1000 MG tablet Take 1 tablet (1,000 mg total) by mouth 2 (two) times daily with a meal. 60 tablet 11  . omeprazole (PRILOSEC) 20 MG capsule Take 1 capsule (20 mg total) by mouth daily. 30 capsule 3   No current facility-administered medications on file prior to visit.    Allergies: Allergies  Allergen Reactions  . Ace Inhibitors Cough    Persistent dry cough  . Amitriptyline Hcl Nausea Only    REACTION: Upset stomach    FH: Family History  Problem Relation Age of Onset  . Diabetes Mother   . Colon cancer Neg Hx   . Lung cancer Maternal Uncle   . Breast cancer Maternal Grandmother   . Liver cancer Maternal Grandfather     SH: History   Social History  . Marital Status: Single    Spouse Name: N/A    Number of Children: 0  . Years of Education: 11   Occupational History  . CNA    Social History Main Topics  . Smoking status: Never Smoker   . Smokeless tobacco: Never Used  . Alcohol Use: No  . Drug Use: No  .  Sexual Activity: None   Other Topics Concern  . None   Social History Narrative   Financial assistance approved for 100% discount at [facility] and has [facility] card per [employer]   09/26/2010    Review of Systems: Constitutional: Negative for fever, chills and weight loss.  Eyes: Negative for blurred vision.  Respiratory: Negative for cough and shortness of breath.  Cardiovascular: Negative for chest pain, palpitations and leg swelling.  Gastrointestinal: Negative for nausea, vomiting, abdominal pain, diarrhea, constipation and blood in stool.  Genitourinary: Negative for dysuria, urgency and frequency.  Musculoskeletal: Negative for myalgias and back pain.  Neurological: Negative for dizziness, weakness and headaches.      Objective:   Vital Signs: Filed Vitals:   12/18/14 1011  BP: 138/82  Pulse: 73  Temp: 97.9 F (36.6 C)  TempSrc: Oral  Height: 4' 11" (1.499 m)  Weight: 181 lb 3.2 oz (82.192 kg)  SpO2: 100%      BP Readings from Last 3 Encounters:  12/18/14 138/82  12/10/14 131/86  11/06/14 137/90    Physical Exam: Constitutional: Vital signs reviewed.  Patient is well-developed and well-nourished in NAD and cooperative with exam.  Head: Normocephalic and atraumatic. Eyes: PERRL, EOMI, conjunctivae nl, no scleral icterus.  Neck: Supple. Cardiovascular: RRR, no MRG. Pulmonary/Chest: normal effort, CTAB, no wheezes, rales, or rhonchi. Abdominal: Soft. NT/ND +BS. Neurological: A&O x3, cranial nerves II-XII are grossly intact, moving all extremities. Extremities: 2+DP b/l; trace pitting edema b/l.  No calf erythema, warmth, or swelling b/l.  No visible lesions.  Tender to palpation of LE b/l R>L.   Skin: Warm, dry and intact. No rash.   Assessment & Plan:   Assessment and plan was discussed and formulated with my attending.

## 2014-12-19 ENCOUNTER — Other Ambulatory Visit: Payer: Self-pay | Admitting: Internal Medicine

## 2014-12-19 NOTE — Progress Notes (Signed)
INTERNAL MEDICINE TEACHING ATTENDING ADDENDUM - Nancy Revolorio, MD: I reviewed and discussed at the time of visit with the resident Dr. Gill, the patient's medical history, physical examination, diagnosis and results of pertinent tests and treatment and I agree with the patient's care as documented.  

## 2014-12-26 ENCOUNTER — Other Ambulatory Visit: Payer: Self-pay | Admitting: *Deleted

## 2014-12-26 DIAGNOSIS — I10 Essential (primary) hypertension: Secondary | ICD-10-CM

## 2014-12-27 MED ORDER — HYDROCHLOROTHIAZIDE 25 MG PO TABS: 25.0000 mg | ORAL_TABLET | Freq: Every day | ORAL | Status: DC

## 2014-12-27 MED ORDER — LOSARTAN POTASSIUM 100 MG PO TABS: 100.0000 mg | ORAL_TABLET | Freq: Every day | ORAL | Status: DC

## 2015-01-11 ENCOUNTER — Ambulatory Visit (INDEPENDENT_AMBULATORY_CARE_PROVIDER_SITE_OTHER): Payer: Self-pay | Admitting: Surgery

## 2015-01-11 NOTE — H&P (Signed)
  History of Present Illness Imogene Burn. Anisha Starliper MD; 01/11/2015 11:04 AM) Patient words: postop cyst.  The patient is a 59 year old female who presents with a subcutaneous abscess. The patient is a 59 year old female who presents with a complaint of Mass. Referred by Dr. Michail Jewels for tender cyst - right occipital region. This is a 59 year old female with diabetes who presents with several months of an enlarging "cyst" in the right occipital region. Recently an enlarged rapidly and became very swollen and tender. It was quite painful to touch. She was evaluated on 12/10/14 at the family practice office and was referred for surgical evaluation. However in the meantime, this area of burst and began draining some purulent material. It remains tender but is slightly improved. She states that she has trouble getting comfortable at night because of the tenderness of this area. She denies any fever. After completing a course of antibiotics, the area is much softer, but she has reaccumulated the cyst. Minimal tenderness.    Allergies (Ammie Eversole, LPN; QA348G QA348G AM) ACE Inhibitors Cough. Amitriptyline HCl *ANTIDEPRESSANTS* Nausea.  Medication History (Ammie Eversole, LPN; QA348G QA348G AM) Doxycycline Hyclate (100MG  Tablet, 1 (one) Tablet Oral two times daily, Taken starting 12/25/2014) Active. Vicodin (5-300MG  Tablet, 1 (one) Tablet Oral every four hours, as needed, Taken starting 12/25/2014) Active. Omeprazole (20MG  Capsule DR, Oral daily) Active. Aspirin Childrens (81MG  Tablet Chewable, Oral daily) Active. Hydrochlorothiazide (25MG  Tablet, Oral daily) Active. Losartan Potassium (100MG  Tablet, Oral daily) Active. MetFORMIN HCl ER (OSM) (1000MG  Tablet ER 24HR, Oral daily) Active.  Vitals (Ammie Eversole LPN; QA348G QA348G AM) 01/11/2015 10:54 AM Weight: 180 lb Height: 58in Body Surface Area: 1.83 m Body Mass Index: 37.62 kg/m Temp.: 97.41F(Oral)  Pulse: 86  (Regular)  BP: 152/88 (Sitting, Left Arm, Standard)    Physical Exam Rodman Key K. Anuj Summons MD; 01/11/2015 11:06 AM) The physical exam findings are as follows: Note:WDWN in NAD HEENT: EOMI, sclera anicteric Neck: Posterior right occipital region - 1.5 cm oval-shaped firm mass in subcutaneous tissue; soft surrounding tissue; Lungs: CTA bilaterally; normal respiratory effort CV: Regular rate and rhythm; no murmurs Abd: +bowel sounds, soft, non-tender, no masses Ext: Well-perfused; no edema Skin: Warm, dry; no sign of jaundice    Assessment & Plan Rodman Key K. Suheyb Raucci MD; 01/11/2015 11:02 AM) INFECTED SEBACEOUS CYST (706.2  L72.3) Current Plans  Schedule for Surgery - Excision of sebaceous cyst - right occipital region. The surgical procedure has been discussed with the patient. Potential risks, benefits, alternative treatments, and expected outcomes have been explained. All of the patient's questions at this time have been answered. The likelihood of reaching the patient's treatment goal is good. The patient understand the proposed surgical procedure and wishes to proceed.   Imogene Burn. Georgette Dover, MD, [facility] Surgery  General/ Trauma Surgery  01/11/2015 11:09 AM

## 2015-01-29 ENCOUNTER — Other Ambulatory Visit: Payer: Self-pay | Admitting: Surgery

## 2015-02-04 ENCOUNTER — Other Ambulatory Visit: Payer: Self-pay | Admitting: Internal Medicine

## 2015-02-19 ENCOUNTER — Encounter: Payer: Medicaid Other | Admitting: Internal Medicine

## 2015-02-26 ENCOUNTER — Ambulatory Visit (INDEPENDENT_AMBULATORY_CARE_PROVIDER_SITE_OTHER): Payer: Medicaid Other | Admitting: Internal Medicine

## 2015-02-26 ENCOUNTER — Encounter: Payer: Self-pay | Admitting: Internal Medicine

## 2015-02-26 VITALS — BP 181/99 | HR 64 | Temp 97.7°F | Ht <= 58 in | Wt 184.4 lb

## 2015-02-26 DIAGNOSIS — I10 Essential (primary) hypertension: Secondary | ICD-10-CM

## 2015-02-26 DIAGNOSIS — B009 Herpesviral infection, unspecified: Secondary | ICD-10-CM

## 2015-02-26 DIAGNOSIS — R0789 Other chest pain: Secondary | ICD-10-CM

## 2015-02-26 DIAGNOSIS — E1149 Type 2 diabetes mellitus with other diabetic neurological complication: Secondary | ICD-10-CM

## 2015-02-26 NOTE — Patient Instructions (Addendum)
General Instructions:  Please bring your medicines with you each time you come to clinic.  Medicines may include prescription medications, over-the-counter medications, herbal remedies, eye drops, vitamins, or other pills.  Please work on your stress level and return in 1 week to recheck your blood pressure. Work on cutting back your salt intake.   If you have any headaches, chest pain, SOB, change or loss of vision, eye pain, sudden weakness call and let me know right away and go directly to ED  If you have any more chest fluttering or pain call me right away   Progress Toward Treatment Goals:  Treatment Goal 11/06/2014  Hemoglobin A1C (No Data)  Blood pressure improved    Self Care Goals & Plans:  Self Care Goal 02/26/2015  Manage my medications take my medicines as prescribed; bring my medications to every visit; refill my medications on time; follow the sick day instructions if I am sick  Monitor my health keep track of my blood glucose; bring my glucose meter and log to each visit; check my feet daily  Eat healthy foods eat more vegetables; eat fruit for snacks and desserts; eat smaller portions; drink diet soda or water instead of juice or soda  Be physically active find an activity I enjoy    Home Blood Glucose Monitoring 11/06/2014  Check my blood sugar 3 times a day  When to check my blood sugar before meals    Care Management & Community Referrals:  Referral 05/09/2013  Referrals made for care management support none needed

## 2015-02-28 NOTE — Assessment & Plan Note (Addendum)
Was apparently told by anesthesia to have her heart checked. Denies any current chest pain or SOB or worsening of DOE but does report occasional substernal strange sensation in her throat going up. Lower extremity edema has improved. She is a non-smoker, diabetes control has improved, BP elevated today. She takes a daily ASA. Last LDL was 101 with low HDL with her working on weight and lifestyle modifications and not currently on statin. She has a family history of heart disease in her brother who passed away from heart failure in his 79s. Again, could be secondary to GERD but she is on PPI.   -will repeat echo--last one in 2011 -follow up anesthesia records--I was able to hear back from surgeon Dr. Gershon Crane after patient had left that she may have had some initial bradycardia with anesthesia -consider repeat EKG on follow up visit if indeed she did have any irregularities noted during surgery/anesthesia or if recurrent episodes of pain -consider TSH check on next visit -consider holter monitoring if sensation continues -may also consider repeat cardiology referral and/or stress test if complaints persist or worsen

## 2015-02-28 NOTE — Progress Notes (Signed)
Subjective:   Patient ID: Nancy [name] female   DOB: July 11, 1956 59 y.o.   MRN: [id number]  HPI: [name] is a 59 y.o. female with PMH as listed below presenting to opc today for routine follow up visit and wanting to discuss her heart.  She says she recently had surgery and was told by anesthesia to have hear heart checked out. She did not know any specifics as to why she was asked to do so but she wanted to discuss. Last echo was July 2011 with EF 60-65% and grade 1 DD. Last EKG 11/2014 78BPM NSR. She was last seen for atypical CP in December 2015.   She currently denies any chest pain but does endorse occasional feeling something coming up to her throat at times that is not pain but just a strange sensation. She denies any radiation of pain to arms no SOB and can walk from the hospital to the breast center without getting markedly short of breath. She denies having trouble breathing at night and does occasionally have lower extremity swelling but says that has improved with her compression socks.   HTN--BP elevated today although she says she has been taking her medications, however, she is stressed out given that her friend is currently in the emergency room.   Past Medical History  Diagnosis Date  . Diabetic peripheral neuropathy   . Carpal tunnel syndrome, bilateral     confirmed on nerve conduction studies  . Hyperlipidemia   . Diabetes mellitus type 2, uncontrolled   . GERD (gastroesophageal reflux disease)   . Heat edema   . History of Helicobacter infection 11/07  . History of chest pain     negative cardiolite September 2007.  ETT-myoview 12/10 with EF 76%, no ischemia,   . History of endometriosis   . Carpal tunnel syndrome    Current Outpatient Prescriptions  Medication Sig Dispense Refill  . ACCU-CHEK FASTCLIX LANCETS MISC Use to check blood sugar up to 3 times a day DX code 250.02 insulin requiring 102 each 5  . aspirin 81 MG tablet Take 1 tablet (81 mg  total) by mouth daily.    . Blood Glucose Monitoring Suppl (ACCU-CHEK NANO SMARTVIEW) W/DEVICE KIT Use to check blood sugar up to 3 times a day DX code 250.02 insulin requiring 1 kit 0  . carbamide peroxide (DEBROX) 6.5 % otic solution Place 5 drops into both ears 2 (two) times daily. (Patient not taking: Reported on 11/06/2014) 15 mL 0  . glucose blood (ACCU-CHEK SMARTVIEW) test strip Use to check blood sugar up to 3 times a day DX code 250.02 insulin requiring 100 each 5  . hydrochlorothiazide (HYDRODIURIL) 25 MG tablet Take 1 tablet (25 mg total) by mouth daily. 90 tablet 3  . LANTUS 100 UNIT/ML injection INJECT 58 UNITS INTO THE SKIN EVERY MORNING WHEN YOU WAKE UP 20 mL 5  . losartan (COZAAR) 100 MG tablet Take 1 tablet (100 mg total) by mouth daily. 90 tablet 3  . metFORMIN (GLUCOPHAGE) 1000 MG tablet Take 1 tablet (1,000 mg total) by mouth 2 (two) times daily with a meal. 60 tablet 11  . omeprazole (PRILOSEC) 20 MG capsule TAKE ONE CAPSULE BY MOUTH DAILY 30 capsule 2   No current facility-administered medications for this visit.   Family History  Problem Relation Age of Onset  . Diabetes Mother   . Colon cancer Neg Hx   . Lung cancer Maternal Uncle   . Breast cancer Maternal Grandmother   .  Liver cancer Maternal Grandfather   . Heart disease Brother    History   Social History  . Marital Status: Single    Spouse Name: N/A  . Number of Children: 0  . Years of Education: 11   Occupational History  . CNA    Social History Main Topics  . Smoking status: Never Smoker   . Smokeless tobacco: Never Used  . Alcohol Use: No  . Drug Use: No  . Sexual Activity: No   Other Topics Concern  . None   Social History Narrative   Financial assistance approved for 100% discount at [facility] and has [facility] card per [employer]   09/26/2010   Review of Systems:  Constitutional:  Denies fever, chills  HEENT:  Denies congestion  Respiratory:  Denies SOB  Cardiovascular:  Improved leg  swelling, no current chest pain  Gastrointestinal:  Denies nausea, vomiting, abdominal pain   Musculoskeletal:  Denies gait problem.   Skin:  Denies rash but hx of HSV  Neurological:  Denies headache    Objective:  Physical Exam: Filed Vitals:   02/26/15 1536  BP: 181/99  Pulse: 64  Temp: 97.7 F (36.5 C)  TempSrc: Oral  Height: '4\' 10"'  (1.473 m)  Weight: 184 lb 6.4 oz (83.643 kg)  SpO2: 100%   Vitals reviewed. General: sitting in chair, NAD HEENT: EOMI Cardiac: RRR Pulm: clear to auscultation bilaterally Abd: soft, obese, nontender, BS present Ext: warm and well perfused, mild lower extremity edema b/l, -tenderness to palpation Neuro: alert and oriented X3  Assessment & Plan:  Discussed with Dr. Lynnae January

## 2015-02-28 NOTE — Progress Notes (Signed)
Internal Medicine Clinic Attending  Case discussed with Dr. Qureshi soon after the resident saw the patient.  We reviewed the resident's history and exam and pertinent patient test results.  I agree with the assessment, diagnosis, and plan of care documented in the resident's note. 

## 2015-02-28 NOTE — Assessment & Plan Note (Addendum)
BP Readings from Last 3 Encounters:  02/26/15 181/99  12/18/14 138/82  12/10/14 131/86   Lab Results  Component Value Date   NA 135 08/21/2013   K 3.7 08/21/2013   CREATININE 0.69 08/21/2013   Assessment: Blood pressure control: moderately elevated Progress toward BP goal:  deteriorated Comments: in the setting of recent stress  Plan: Medications:  continue current medications losartan 100mg  hctz 25mg  daily Educational resources provided: brochure, handout, video Self management tools provided:   Other plans: recheck in 1 week, if remains elevated, may need to consider increasing dose. Recheck bmet next visit, she was limited on time as she wanted to return to check on her friend

## 2015-03-01 NOTE — Assessment & Plan Note (Signed)
Reports one recurrent episode of small "rash" in between her buttocks a while ago that has resolve on its own. No current outbreak or lesions per patient. Advised if recurrent episodes, to let me know right away and we can try anti-viral medication. Will treat for now as episodic treatment but if frequency of recurrences increases may need chronic suppressive therapy.

## 2015-03-01 NOTE — Assessment & Plan Note (Addendum)
Lab Results  Component Value Date   HGBA1C 6.7 12/10/2014   HGBA1C 11.0 10/02/2014   HGBA1C 7.3 05/11/2014    Assessment: Diabetes control: fair control Progress toward A1C goal:    Comments: next check April. Improved CBG's per meter readings, average 132, denies hypoglycemic episodes  Plan: Medications:  continue current medications metformin 1000mg  bid and lantus 58 units qam Home glucose monitoring: Frequency: 3 times a day Timing: before meals Instruction/counseling given: reminded to bring blood glucose meter & log to each visit, reminded to bring medications to each visit, discussed the need for weight loss and discussed diet Educational resources provided: brochure, handout, video Self management tools provided: copy of home glucose meter download

## 2015-03-06 ENCOUNTER — Ambulatory Visit (INDEPENDENT_AMBULATORY_CARE_PROVIDER_SITE_OTHER): Payer: Medicaid Other | Admitting: Internal Medicine

## 2015-03-06 ENCOUNTER — Encounter: Payer: Self-pay | Admitting: Internal Medicine

## 2015-03-06 VITALS — BP 146/90 | HR 82 | Temp 98.2°F | Ht <= 58 in | Wt 180.7 lb

## 2015-03-06 DIAGNOSIS — E1149 Type 2 diabetes mellitus with other diabetic neurological complication: Secondary | ICD-10-CM

## 2015-03-06 DIAGNOSIS — I10 Essential (primary) hypertension: Secondary | ICD-10-CM

## 2015-03-06 DIAGNOSIS — Z0000 Encounter for general adult medical examination without abnormal findings: Secondary | ICD-10-CM

## 2015-03-06 LAB — CBC
HCT: 38.3 % (ref 36.0–46.0)
Hemoglobin: 13.4 g/dL (ref 12.0–15.0)
MCH: 26.6 pg (ref 26.0–34.0)
MCHC: 35 g/dL (ref 30.0–36.0)
MCV: 76 fL — ABNORMAL LOW (ref 78.0–100.0)
MPV: 10.5 fL (ref 8.6–12.4)
Platelets: 282 10*3/uL (ref 150–400)
RBC: 5.04 MIL/uL (ref 3.87–5.11)
RDW: 14.5 % (ref 11.5–15.5)
WBC: 7.3 10*3/uL (ref 4.0–10.5)

## 2015-03-06 LAB — COMPLETE METABOLIC PANEL WITH GFR
ALT: 14 U/L (ref 0–35)
AST: 20 U/L (ref 0–37)
Albumin: 3.5 g/dL (ref 3.5–5.2)
Alkaline Phosphatase: 83 U/L (ref 39–117)
BUN: 15 mg/dL (ref 6–23)
CHLORIDE: 100 meq/L (ref 96–112)
CO2: 26 meq/L (ref 19–32)
CREATININE: 0.77 mg/dL (ref 0.50–1.10)
Calcium: 9.5 mg/dL (ref 8.4–10.5)
GFR, Est African American: >89 mL/min
GFR, Est Non African American: 86 mL/min
Glucose, Bld: 190 mg/dL — ABNORMAL HIGH (ref 70–99)
Potassium: 3.7 mEq/L (ref 3.5–5.3)
Sodium: 136 mEq/L (ref 135–145)
TOTAL PROTEIN: 6.9 g/dL (ref 6.0–8.3)
Total Bilirubin: 0.4 mg/dL (ref 0.2–1.2)

## 2015-03-06 LAB — POCT GLYCOSYLATED HEMOGLOBIN (HGB A1C): Hemoglobin A1C: 6.8

## 2015-03-06 LAB — GLUCOSE, CAPILLARY: Glucose-Capillary: 170 mg/dL — ABNORMAL HIGH (ref 70–99)

## 2015-03-06 NOTE — Assessment & Plan Note (Addendum)
-  Obtain CBC w.diff (last one 08/21/13) -Obtain annual HIV Ab screening

## 2015-03-06 NOTE — Assessment & Plan Note (Signed)
Assessment: Pt with last A1c of 6.7 on 12/10/14 compliant with insulin and hypoglycemic therapy with no recent symptomatic hypoglycemia who presents with CBG of 170 and unchanged A1c of 6.8.   Plan:  -A1c 6.8 at goal <7, continue Lantus 58 U daily and and metformin 1000 mg BID.  -BP 146/90 near goal <140/90, consider adding diltiazem in setting of proteinuria. Continue losartan 100 mg daily and HCTZ 25 mg daily.  -LDL 101 not at goal <100, consider adding moderate to high intensity statin at next visit -Last annual eye exam normal on 10/16/14 -Last annual foot exam on 05/28/14 -Last annual urine microalbumin on 12/10/14 with 1.2 of proteinuria, consider adding diltiazem at next visit  -BMI 37.78 not at goal <25, encourage weight

## 2015-03-06 NOTE — Assessment & Plan Note (Signed)
Assessment: Pt with moderately well-controlled hypertension compliant with two-class (ARB & diuretic) anti-hypertensive therapy who presents with blood pressure of 146/90.   Plan:  -BP 146/90 near goal <140/90 -Consider adding diltiazem at next visit in setting of proteinuria (1.2 g) -Continue losartan 100 mg daily and HCTZ 25 mg daily

## 2015-03-06 NOTE — Progress Notes (Signed)
Patient ID: Nancy [name], female   DOB: 1956/10/23, 59 y.o.   MRN: [id number]    Subjective:   Patient ID: Nancy [name] female   DOB: 1956-04-14 59 y.o.   MRN: [id number]  HPI: [name] is a 59 y.o. pleasant woman with past medical history of hypertension, hyperlipidemia, insulin-dependent Type 2 DM, bilateral carpal tunnel syndrome, and GERD who presents for follow-up of hypertension.   She was last seen on 3/24 and found to have blood pressure of 181/99 in setting of recent stressors. She is compliant with taking losartan and HCTZ daily.  She has occasional headache and lightheadedness but denies blurry vision, chest pain, or LE edema.    Her last A1c was 6.7 on 12/10/14. She is compliant with Lantus 58 U daily and metformin 1000 mg BID. She checks her blood sugar twice daily but did not bring her meter today. She reports her most recent blood sugars have been in the 100's. She denies symptomatic hypoglycemia. She has chronic polydipsia, polyuria, polyphagia, and neuropathy but denies blurry vision or foot injury/ulcer. She reports her diet is ok and tries to exercise regularly. Her weight has been stable.     Past Medical History  Diagnosis Date  . Diabetic peripheral neuropathy   . Carpal tunnel syndrome, bilateral     confirmed on nerve conduction studies  . Hyperlipidemia   . Diabetes mellitus type 2, uncontrolled   . GERD (gastroesophageal reflux disease)   . Heat edema   . History of Helicobacter infection 11/07  . History of chest pain     negative cardiolite September 2007.  ETT-myoview 12/10 with EF 76%, no ischemia,   . History of endometriosis   . Carpal tunnel syndrome    Current Outpatient Prescriptions  Medication Sig Dispense Refill  . ACCU-CHEK FASTCLIX LANCETS MISC Use to check blood sugar up to 3 times a day DX code 250.02 insulin requiring 102 each 5  . aspirin 81 MG tablet Take 1 tablet (81 mg total) by mouth daily.    . Blood Glucose Monitoring  Suppl (ACCU-CHEK NANO SMARTVIEW) W/DEVICE KIT Use to check blood sugar up to 3 times a day DX code 250.02 insulin requiring 1 kit 0  . carbamide peroxide (DEBROX) 6.5 % otic solution Place 5 drops into both ears 2 (two) times daily. (Patient not taking: Reported on 11/06/2014) 15 mL 0  . glucose blood (ACCU-CHEK SMARTVIEW) test strip Use to check blood sugar up to 3 times a day DX code 250.02 insulin requiring 100 each 5  . hydrochlorothiazide (HYDRODIURIL) 25 MG tablet Take 1 tablet (25 mg total) by mouth daily. 90 tablet 3  . LANTUS 100 UNIT/ML injection INJECT 58 UNITS INTO THE SKIN EVERY MORNING WHEN YOU WAKE UP 20 mL 5  . losartan (COZAAR) 100 MG tablet Take 1 tablet (100 mg total) by mouth daily. 90 tablet 3  . metFORMIN (GLUCOPHAGE) 1000 MG tablet Take 1 tablet (1,000 mg total) by mouth 2 (two) times daily with a meal. 60 tablet 11  . omeprazole (PRILOSEC) 20 MG capsule TAKE ONE CAPSULE BY MOUTH DAILY 30 capsule 2   No current facility-administered medications for this visit.   Family History  Problem Relation Age of Onset  . Diabetes Mother   . Colon cancer Neg Hx   . Lung cancer Maternal Uncle   . Breast cancer Maternal Grandmother   . Liver cancer Maternal Grandfather   . Heart disease Brother    History   Social History  .  Marital Status: Single    Spouse Name: N/A  . Number of Children: 0  . Years of Education: 11   Occupational History  . CNA    Social History Main Topics  . Smoking status: Never Smoker   . Smokeless tobacco: Never Used  . Alcohol Use: No  . Drug Use: No  . Sexual Activity: No   Other Topics Concern  . None   Social History Narrative   Financial assistance approved for 100% discount at [facility] and has [facility] card per [employer]   09/26/2010   Review of Systems: Review of Systems  Constitutional: Negative for fever, chills and weight loss.  Eyes: Negative for blurred vision.  Respiratory: Negative for cough and shortness of breath.     Cardiovascular: Negative for chest pain and leg swelling.       Occasional fluttering   Gastrointestinal: Negative for nausea, vomiting, abdominal pain, diarrhea and constipation.  Genitourinary: Negative for dysuria, urgency and frequency.       Chronic polyuria  Musculoskeletal: Positive for myalgias.  Neurological: Positive for dizziness (occasionally ), sensory change (chronic peripheral neuropathy) and headaches (occasionally ).       Bilateral carpal tunnel syndrome   Endo/Heme/Allergies: Positive for polydipsia (chronic).    Objective:  Physical Exam: Filed Vitals:   03/06/15 1056  BP: 146/90  Pulse: 82  Temp: 98.2 F (36.8 C)  TempSrc: Oral  Height: '4\' 10"'  (1.473 m)  Weight: 180 lb 11.2 oz (81.965 kg)  SpO2: 100%    Physical Exam  Constitutional: She is oriented to person, place, and time. She appears well-developed and well-nourished. No distress.  HENT:  Head: Normocephalic and atraumatic.  Right Ear: External ear normal.  Left Ear: External ear normal.  Nose: Nose normal.  Mouth/Throat: Oropharynx is clear and moist. No oropharyngeal exudate.  Eyes: Conjunctivae and EOM are normal. Pupils are equal, round, and reactive to light. Right eye exhibits no discharge. Left eye exhibits no discharge. No scleral icterus.  Neck: Normal range of motion. Neck supple.  Cardiovascular: Normal rate and regular rhythm.   Pulmonary/Chest: Effort normal and breath sounds normal. No respiratory distress. She has no wheezes. She has no rales.  Abdominal: Soft. Bowel sounds are normal. She exhibits no distension. There is no tenderness. There is no rebound and no guarding.  Musculoskeletal: Normal range of motion. She exhibits no edema or tenderness.  Neurological: She is alert and oriented to person, place, and time.  Skin: Skin is warm and dry. No rash noted. She is not diaphoretic. No erythema. No pallor.  Psychiatric: She has a normal mood and affect. Her behavior is normal.  Judgment and thought content normal.    Assessment & Plan:   Please see problem list for problem-based assessment and plan

## 2015-03-06 NOTE — Progress Notes (Signed)
Internal Medicine Clinic Attending  Case discussed with Dr. Rabbani soon after the resident saw the patient.  We reviewed the resident's history and exam and pertinent patient test results.  I agree with the assessment, diagnosis, and plan of care documented in the resident's note.  

## 2015-03-06 NOTE — Patient Instructions (Signed)
-  Will check your bloodwork including A1c today -Keep taking your current medications, you blood pressure is much better today! -Very nice meeting you!   General Instructions:   Please bring your medicines with you each time you come to clinic.  Medicines may include prescription medications, over-the-counter medications, herbal remedies, eye drops, vitamins, or other pills.   Progress Toward Treatment Goals:  Treatment Goal 02/26/2015  Hemoglobin A1C -  Blood pressure deteriorated    Self Care Goals & Plans:  Self Care Goal 03/06/2015  Manage my medications take my medicines as prescribed; bring my medications to every visit; refill my medications on time  Monitor my health keep track of my blood glucose; bring my glucose meter and log to each visit  Eat healthy foods -  Be physically active -    Home Blood Glucose Monitoring 02/26/2015  Check my blood sugar 3 times a day  When to check my blood sugar before meals     Care Management & Community Referrals:  Referral 05/09/2013  Referrals made for care management support none needed

## 2015-03-07 LAB — HIV ANTIBODY (ROUTINE TESTING W REFLEX): HIV: NONREACTIVE

## 2015-03-11 ENCOUNTER — Other Ambulatory Visit: Payer: Self-pay | Admitting: Internal Medicine

## 2015-03-11 ENCOUNTER — Other Ambulatory Visit: Payer: Self-pay | Admitting: *Deleted

## 2015-03-11 MED ORDER — OMEPRAZOLE 20 MG PO CPDR: 20.0000 mg | DELAYED_RELEASE_CAPSULE | Freq: Every day | ORAL | Status: DC

## 2015-03-11 NOTE — Telephone Encounter (Signed)
Hi helen,  She should have plenty of these refills left. BP meds were filled 90 day supplies with refills last time and lantus was filled with 5 refills.   Thanks,  Dr q

## 2015-03-12 NOTE — Progress Notes (Signed)
Pt aware of appt 2D echo [facility] 03/22/15 10AM - no restrictions. Hilda Blades Keiosha Cancro RN 03/12/15 3PM

## 2015-03-13 ENCOUNTER — Encounter: Payer: Self-pay | Admitting: *Deleted

## 2015-03-15 ENCOUNTER — Other Ambulatory Visit: Payer: Self-pay

## 2015-03-15 DIAGNOSIS — Z1231 Encounter for screening mammogram for malignant neoplasm of breast: Secondary | ICD-10-CM

## 2015-03-19 LAB — HM DIABETES EYE EXAM

## 2015-03-21 ENCOUNTER — Ambulatory Visit
Admission: RE | Admit: 2015-03-21 | Discharge: 2015-03-21 | Disposition: A | Discharge status: Home / Self Care | Payer: Medicaid Other | Source: Ambulatory Visit

## 2015-03-21 DIAGNOSIS — Z1231 Encounter for screening mammogram for malignant neoplasm of breast: Secondary | ICD-10-CM

## 2015-03-22 ENCOUNTER — Ambulatory Visit (HOSPITAL_COMMUNITY)
Admission: RE | Admit: 2015-03-22 | Discharge: 2015-03-22 | Disposition: A | Discharge status: Home / Self Care | Payer: Medicaid Other | Source: Ambulatory Visit | Attending: Internal Medicine | Admitting: Internal Medicine

## 2015-03-22 DIAGNOSIS — R0789 Other chest pain: Secondary | ICD-10-CM | POA: Insufficient documentation

## 2015-03-22 NOTE — Progress Notes (Signed)
  Echocardiogram 2D Echocardiogram has been performed.  Nancy [name] M 03/22/2015, 10:38 AM

## 2015-03-27 ENCOUNTER — Telehealth: Payer: Self-pay | Admitting: Internal Medicine

## 2015-03-27 NOTE — Telephone Encounter (Signed)
I called Ms. Bergey this afternoon after reviewing anesthesia records from her recent sebaceous cyst excision. I tried to read the anesthesia notes as best as possible and do not see any clear mention of hear having "having problems" that she says she was told by them to have her "heart checked out". There is mention of elevated BP that was treated. I reviewed this with her on the phone today and recommended that she still return to clinic for further evaluation if she is concerned and we can repeat and EKG at least. She denies having any chest pain and says she no longer has the chest "fluttering" feeling she has had in the past.  She says she has been meaning to schedule an appointment for opc anyways in regards to neck pain since her procedure that she notices when turning left and right. I have encouraged her to do so at her earliest convenience and at that time she can also discuss her surgery/anesthesia experience and if further cardiac evaluation is deemed necessary at that time they can do that while she is in the office. In the meantime, if her neck pain worsens or does not improve, again she is recommended to come in for evaluation. She can try some heat to the area to see if that may help. She inquired about muscle relaxers but she would need further evaluation first prior to any medication prescriptions.   She would like to call back for an appointment and I will ask the front desk to please assist her in having her scheduled to see someone in [facility].

## 2015-04-08 ENCOUNTER — Encounter: Payer: Self-pay | Admitting: *Deleted

## 2015-04-16 ENCOUNTER — Other Ambulatory Visit: Payer: Self-pay | Admitting: Internal Medicine

## 2015-04-18 ENCOUNTER — Encounter: Payer: Self-pay | Admitting: *Deleted

## 2015-04-29 ENCOUNTER — Encounter: Payer: Self-pay | Admitting: Internal Medicine

## 2015-04-29 ENCOUNTER — Ambulatory Visit (INDEPENDENT_AMBULATORY_CARE_PROVIDER_SITE_OTHER): Payer: Medicaid Other | Admitting: Internal Medicine

## 2015-04-29 ENCOUNTER — Ambulatory Visit (HOSPITAL_COMMUNITY)
Admission: RE | Admit: 2015-04-29 | Discharge: 2015-04-29 | Disposition: A | Discharge status: Home / Self Care | Payer: Medicaid Other | Source: Ambulatory Visit | Attending: Internal Medicine | Admitting: Internal Medicine

## 2015-04-29 VITALS — BP 151/91 | HR 76 | Temp 98.0°F | Ht <= 58 in | Wt 183.0 lb

## 2015-04-29 DIAGNOSIS — M5417 Radiculopathy, lumbosacral region: Secondary | ICD-10-CM

## 2015-04-29 DIAGNOSIS — M79605 Pain in left leg: Secondary | ICD-10-CM | POA: Insufficient documentation

## 2015-04-29 DIAGNOSIS — M545 Low back pain, unspecified: Secondary | ICD-10-CM

## 2015-04-29 DIAGNOSIS — E1140 Type 2 diabetes mellitus with diabetic neuropathy, unspecified: Secondary | ICD-10-CM | POA: Diagnosis not present

## 2015-04-29 DIAGNOSIS — Z760 Encounter for issue of repeat prescription: Secondary | ICD-10-CM

## 2015-04-29 DIAGNOSIS — M79672 Pain in left foot: Secondary | ICD-10-CM | POA: Insufficient documentation

## 2015-04-29 DIAGNOSIS — E1149 Type 2 diabetes mellitus with other diabetic neurological complication: Secondary | ICD-10-CM

## 2015-04-29 MED ORDER — HYDROCHLOROTHIAZIDE 25 MG PO TABS: 25.0000 mg | ORAL_TABLET | Freq: Every day | ORAL | Status: DC

## 2015-04-29 MED ORDER — CYCLOBENZAPRINE HCL 5 MG PO TABS: 5.0000 mg | ORAL_TABLET | Freq: Three times a day (TID) | ORAL | Status: DC | PRN

## 2015-04-29 MED ORDER — LOSARTAN POTASSIUM 100 MG PO TABS: 100.0000 mg | ORAL_TABLET | Freq: Every day | ORAL | Status: DC

## 2015-04-29 MED ORDER — KETOROLAC TROMETHAMINE 30 MG/ML IJ SOLN
30.0000 mg | Freq: Once | INTRAMUSCULAR | Status: AC
  Administered 2015-04-29: 30 mg via INTRAMUSCULAR

## 2015-04-29 MED ORDER — ASPIRIN EC 81 MG PO TBEC: 81.0000 mg | DELAYED_RELEASE_TABLET | Freq: Every day | ORAL | Status: DC

## 2015-04-29 MED ORDER — METFORMIN HCL 1000 MG PO TABS: 1000.0000 mg | ORAL_TABLET | Freq: Two times a day (BID) | ORAL | Status: DC

## 2015-04-29 MED ORDER — INSULIN GLARGINE 100 UNIT/ML ~~LOC~~ SOLN: SUBCUTANEOUS | Status: DC

## 2015-04-29 MED ORDER — OMEPRAZOLE 20 MG PO CPDR: 20.0000 mg | DELAYED_RELEASE_CAPSULE | Freq: Every day | ORAL | Status: DC

## 2015-04-29 NOTE — Assessment & Plan Note (Signed)
Pt symptoms and PE findings support ongoing radiculopathy. She does not have any red flag symptoms that would warrant immediate surgical evaluation. Patient has not had any recent lumbar spine imaging. Patient has not had any symptomatically management. -IM Toradol 30 mg given in clinic -Referral to physical therapy -Lumbar x-ray -Flexeril when necessary and advised to use ibuprofen scheduled every 6 hours for at least the next week for relief -Follow-up in 2-4 weeks for improvement

## 2015-04-29 NOTE — Assessment & Plan Note (Signed)
Lab Results  Component Value Date   HGBA1C 6.8 03/06/2015   HGBA1C 6.7 12/10/2014   HGBA1C 11.0 10/02/2014     Assessment: Diabetes control:   Progress toward A1C goal:    Comments: pt has very good control   Plan: Medications:  continue current medications Home glucose monitoring: Frequency:   Timing:   Instruction/counseling given: discussed diet Educational resources provided: brochure, handout Self management tools provided: copy of home glucose meter download Other plans: pt doing very well has completed eye exam. F/u in 3 months

## 2015-04-29 NOTE — Progress Notes (Signed)
Subjective:   Patient ID: Nancy [name] female   DOB: 1956-12-03 59 y.o.   MRN: [id number]  HPI: Nancy [name] is a 59 y.o. woman with a past medical history as listed below who presents for ongoing low back pain.  Pt states this has not improved in about 4-6 months and is bilateral as an ache and she feels sometimes as if the muscle is "spasming." She denied any trauma to the area. She has not tried anything over-the-counter for relief worrying that she will alter her diabetes management. She has not had any paresthesias, saddle anesthesia, urinary or bowel incontinence. Over the past month she has noticed increasing left-sided lateral pain that radiates down to the lateral aspect of her left foot. She describes it as a tingling sensation. She has not had any leg weakness or changes in her ambulation. She has not done any recent heavy lifting and has not had any back imaging or surgery. She denied any fevers or chills, rashes, chest pain, or shortness of breath.  DM - Patient checking blood sugars 2  times daily, before breakfast and dinner. Currently taking lantus and metformin. No hypoglycemic episodes since last visit. denies polyuria, polydipsia, nausea, vomiting, diarrhea.  does request refills today.   Past Medical History  Diagnosis Date  . Diabetic peripheral neuropathy   . Carpal tunnel syndrome, bilateral     confirmed on nerve conduction studies  . Hyperlipidemia   . Diabetes mellitus type 2, uncontrolled   . GERD (gastroesophageal reflux disease)   . Heat edema   . History of Helicobacter infection 11/07  . History of chest pain     negative cardiolite September 2007.  ETT-myoview 12/10 with EF 76%, no ischemia,   . History of endometriosis   . Carpal tunnel syndrome    Current Outpatient Prescriptions  Medication Sig Dispense Refill  . ACCU-CHEK FASTCLIX LANCETS MISC Use to check blood sugar up to 3 times a day DX code 250.02 insulin requiring 102 each 5  .  aspirin 81 MG tablet Take 1 tablet (81 mg total) by mouth daily.    . Blood Glucose Monitoring Suppl (ACCU-CHEK NANO SMARTVIEW) W/DEVICE KIT Use to check blood sugar up to 3 times a day DX code 250.02 insulin requiring 1 kit 0  . carbamide peroxide (DEBROX) 6.5 % otic solution Place 5 drops into both ears 2 (two) times daily. (Patient not taking: Reported on 11/06/2014) 15 mL 0  . glucose blood (ACCU-CHEK SMARTVIEW) test strip Use to check blood sugar up to 3 times a day DX code 250.02 insulin requiring 100 each 5  . hydrochlorothiazide (HYDRODIURIL) 25 MG tablet Take 1 tablet (25 mg total) by mouth daily. 90 tablet 3  . LANTUS 100 UNIT/ML injection INJECT 58 UNITS INTO THE SKIN EVERY MORNING WHEN YOU WAKE UP 20 mL 5  . losartan (COZAAR) 100 MG tablet Take 1 tablet (100 mg total) by mouth daily. 90 tablet 3  . metFORMIN (GLUCOPHAGE) 1000 MG tablet Take 1 tablet (1,000 mg total) by mouth 2 (two) times daily with a meal. 60 tablet 11  . omeprazole (PRILOSEC) 20 MG capsule Take 1 capsule (20 mg total) by mouth daily. 30 capsule 2   No current facility-administered medications for this visit.   Family History  Problem Relation Age of Onset  . Diabetes Mother   . Colon cancer Neg Hx   . Lung cancer Maternal Uncle   . Breast cancer Maternal Grandmother   . Liver cancer Maternal Grandfather   .  Heart disease Brother    History   Social History  . Marital Status: Single    Spouse Name: N/A  . Number of Children: 0  . Years of Education: 11   Occupational History  . CNA    Social History Main Topics  . Smoking status: Never Smoker   . Smokeless tobacco: Never Used  . Alcohol Use: No  . Drug Use: No  . Sexual Activity: No   Other Topics Concern  . None   Social History Narrative   Financial assistance approved for 100% discount at [facility] and has [facility] card per [employer]   09/26/2010   Review of Systems: Pertinent items are noted in HPI. Objective:  Physical Exam: Filed Vitals:     04/29/15 1411  BP: 151/91  Pulse: 76  Temp: 98 F (36.7 C)  TempSrc: Oral  Height: _0  (1.473 m)  Weight: 183 lb (83.008 kg)  SpO2: 100%   General: sitting in chair, NAD  Cardiac: RRR, no rubs, murmurs or gallops Pulm: clear to auscultation bilaterally, moving normal volumes of air Abd: soft, nontender, nondistended, BS present Ext: warm and well perfused, no pedal edema, no rashes appreciated MSK: 5/5 LE strength, + straight leg raise, no tenderness over spinal processes, bilateral paraspinal tenderness, 2+ DTRs throughout, normal sensation, no groin point tenderness, Hip FROM bilaterally  Assessment & Plan:  Please see problem oriented charting  Pt discussed with Dr. Lynnae January

## 2015-04-29 NOTE — Patient Instructions (Signed)
General Instructions:   Please try to bring all your medicines next time. This will help [place] keep you safe from mistakes.   For your back pain we will give you a shot in clinic for some pain. Then get a Xray of your back before you leave. Then some muscle relaxant.   We will get you to physical therapy to help strengthen some of your muscles.   Your diabetes is excellent!    Progress Toward Treatment Goals:  Treatment Goal 02/26/2015  Hemoglobin A1C -  Blood pressure deteriorated    Self Care Goals & Plans:  Self Care Goal 04/29/2015  Manage my medications take my medicines as prescribed; bring my medications to every visit; refill my medications on time; follow the sick day instructions if I am sick  Monitor my health keep track of my blood glucose; bring my glucose meter and log to each visit; check my feet daily  Eat healthy foods eat more vegetables; eat fruit for snacks and desserts; eat baked foods instead of fried foods; eat smaller portions; drink diet soda or water instead of juice or soda  Be physically active find an activity I enjoy    Home Blood Glucose Monitoring 02/26/2015  Check my blood sugar 3 times a day  When to check my blood sugar before meals     Care Management & Community Referrals:  Referral 05/09/2013  Referrals made for care management support none needed

## 2015-04-30 NOTE — Progress Notes (Signed)
Internal Medicine Clinic Attending  Case discussed with Dr. Sadek soon after the resident saw the patient.  We reviewed the resident's history and exam and pertinent patient test results.  I agree with the assessment, diagnosis, and plan of care documented in the resident's note. 

## 2015-05-14 ENCOUNTER — Encounter: Payer: Medicaid Other | Admitting: Internal Medicine

## 2015-05-20 ENCOUNTER — Ambulatory Visit: Payer: Medicaid Other | Attending: Internal Medicine

## 2015-05-20 DIAGNOSIS — G8929 Other chronic pain: Secondary | ICD-10-CM | POA: Diagnosis not present

## 2015-05-20 DIAGNOSIS — IMO0002 Reserved for concepts with insufficient information to code with codable children: Secondary | ICD-10-CM

## 2015-05-20 DIAGNOSIS — M2560 Stiffness of unspecified joint, not elsewhere classified: Secondary | ICD-10-CM | POA: Diagnosis not present

## 2015-05-20 DIAGNOSIS — M62838 Other muscle spasm: Secondary | ICD-10-CM | POA: Diagnosis not present

## 2015-05-20 DIAGNOSIS — M549 Dorsalgia, unspecified: Secondary | ICD-10-CM | POA: Insufficient documentation

## 2015-05-20 DIAGNOSIS — M62830 Muscle spasm of back: Secondary | ICD-10-CM | POA: Insufficient documentation

## 2015-05-20 NOTE — Addendum Note (Signed)
Addended by: Darrel Hoover on: 05/20/2015 11:39 AM   Modules accepted: Orders

## 2015-05-20 NOTE — Therapy (Signed)
[place], [place], [address] Phone: [phone]   Fax:  [phone]  Physical Therapy Evaluation  Patient Details  Name: Nancy [name] MRN: [id number] Date of Birth: 08/26/1956 Referring Provider:  Jerrye Noble, MD  Encounter Date: 05/20/2015      PT End of Session - 05/20/15 1057    Visit Number 1   Authorization Type EVAL only due to Medicaid limits   PT Start Time 1015   PT Stop Time 1110   PT Time Calculation (min) 55 min   Activity Tolerance Patient tolerated treatment well   Behavior During Therapy [facility] for tasks assessed/performed      Past Medical History  Diagnosis Date  . Diabetic peripheral neuropathy   . Carpal tunnel syndrome, bilateral     confirmed on nerve conduction studies  . Hyperlipidemia   . Diabetes mellitus type 2, uncontrolled   . GERD (gastroesophageal reflux disease)   . Heat edema   . History of Helicobacter infection 11/07  . History of chest pain     negative cardiolite September 2007.  ETT-myoview 12/10 with EF 76%, no ischemia,   . History of endometriosis   . Carpal tunnel syndrome     Past Surgical History  Procedure Laterality Date  . Abdominal hysterectomy    . Carpal tunnel release      There were no vitals filed for this visit.  Visit Diagnosis:  Chronic back pain greater than 3 months duration  Joint stiffness of spine  Stiffness of joint of lower extremity  Muscle spasm of back  Muscle spasms of both lower extremities      Subjective Assessment - 05/20/15 1021    Subjective Back pain with pain into both legs at times to feet. She has trouble getting out of bed at times.   Pertinent History She reports over 6 months ago and reports no back pain prior to this.  Mow grass (1-2 hours)  and walk for exericse.    How long can you sit comfortably? 10 min   How long can you walk comfortably? 20 min   Diagnostic tests xray: spondylosis.    Currently in Pain? Yes   Pain Score 8   No obvious distress and movement walking without limp or antalgic movement.    Pain Location Back   Pain Orientation Left;Right;Posterior  insde thighs and lateral thigh and lower legs .    Pain Descriptors / Indicators Aching   Pain Type Chronic pain   Pain Onset More than a month ago   Pain Frequency Constant   Aggravating Factors  walking stiffens and tightens muscles and sitting gives back pain /pressure   Pain Relieving Factors heat, medication   Multiple Pain Sites No            [facility] PT Assessment - 05/20/15 1029    Assessment   Medical Diagnosis bilateral back pain with pain into legs   Onset Date/Surgical Date --  more than 6 months per pt.    Next MD Visit PRN   Prior Therapy No   Precautions   Precautions None   Restrictions   Weight Bearing Restrictions No   Balance Screen   Has the patient fallen in the past 6 months No   Prior Function   Level of Independence Independent   Cognition   Overall Cognitive Status Within Functional Limits for tasks assessed   Posture/Postural Control   Posture Comments increased lumbar lordosis   ROM /  Strength   AROM / PROM / Strength AROM;Strength   AROM   Overall AROM Comments All motions of both legs were limited and painful at hips knee and ankles.    AROM Assessment Site Lumbar   Lumbar Flexion She can touch her proximal tibias   Lumbar Extension 10    Lumbar - Right Side Bend 10   Lumbar - Left Side Bend 10   Lumbar - Right Rotation 35   Lumbar - Left Rotation 40   Strength   Overall Strength Comments She reports pain in legs with testing of LE so dhe did not give nmax effort   Palpation   Palpation comment Tender over paraspinals in lumbar and lower lumbar.    Ambulation/Gait   Gait Comments WNL                   [facility] Adult PT Treatment/Exercise - 05/20/15 1029    Modalities   Modalities Electrical Stimulation   Electrical Stimulation   Electrical  Stimulation Location lower back   Electrical Stimulation Action IFC   Electrical Stimulation Parameters L12   Electrical Stimulation Goals Pain                PT Education - 05/20/15 1056    Education provided Yes   Education Details POC, Medicaid limits  TENS    Person(s) Educated Patient   Methods Explanation   Comprehension Verbalized understanding                    Plan - 05/20/15 1057    Clinical Impression Statement discharge with HEP. Request TENS unit for home   Rehab Potential Fair   PT Next Visit Plan None planned. Will request TENS order and if signed will facilitate purchase   PT Home Exercise Plan Stetching and basic strength core. Issued from cabinet basic back exercises and posture and mechanics ed   Consulted and Agree with Plan of Care Patient     She felt better post electrical stimulation so I will send an order for home TENS to MD for signature if they feel this will help and then will contact vendor    Problem List Patient Active Problem List   Diagnosis Date Noted  . Lumbosacral radiculopathy 04/29/2015  . Bilateral lower extremity edema 12/18/2014  . Epidermoid cyst of the right lower scalp 12/10/2014  . Health care maintenance 10/03/2014  . Cerumen impaction 10/03/2014  . Cramps of lower extremity 07/03/2014  . Persistent dry cough 05/11/2014  . HSV-2 (herpes simplex virus 2) infection 05/11/2014  . Allergic conjunctivitis of both eyes 01/16/2014  . Helicobacter pylori gastrointestinal tract infection 01/16/2014  . Atypical chest pain 01/16/2014  . Adiposity 01/16/2014  . Allergic rhinitis, seasonal 01/16/2014  . GERD (gastroesophageal reflux disease) 01/16/2014  . Neuropathy, thoracic (radicular) 08/18/2013  . Pronator syndrome 07/26/2013  . Tuberculosis screening 05/09/2013  . Abdominal pain 03/23/2013  . Bilateral foot pain 09/01/2012  . Muscle spasm 02/29/2012  . Tubular adenoma of colon 07/31/2011  . Carpal tunnel  syndrome, bilateral   . Dysphagia 04/03/2011  . SHOULDER PAIN, LEFT 02/21/2008  . Hyperlipidemia 11/09/2006  . Essential hypertension 11/09/2006  . Diabetes mellitus type 2 with neuropathy 12/07/2002    Darrel Hoover PT 05/20/2015, 11:02 AM  [facility] [address] [place], [place], [address] Phone: [phone]   Fax:  [phone]

## 2015-05-21 ENCOUNTER — Encounter: Payer: Medicaid Other | Admitting: Internal Medicine

## 2015-05-22 ENCOUNTER — Ambulatory Visit (INDEPENDENT_AMBULATORY_CARE_PROVIDER_SITE_OTHER): Payer: Medicaid Other | Admitting: Internal Medicine

## 2015-05-22 ENCOUNTER — Encounter: Payer: Self-pay | Admitting: Internal Medicine

## 2015-05-22 VITALS — BP 132/79 | HR 87 | Temp 97.8°F | Ht <= 58 in | Wt 180.1 lb

## 2015-05-22 DIAGNOSIS — E1140 Type 2 diabetes mellitus with diabetic neuropathy, unspecified: Secondary | ICD-10-CM

## 2015-05-22 DIAGNOSIS — K529 Noninfective gastroenteritis and colitis, unspecified: Secondary | ICD-10-CM

## 2015-05-22 DIAGNOSIS — E1149 Type 2 diabetes mellitus with other diabetic neurological complication: Secondary | ICD-10-CM

## 2015-05-22 DIAGNOSIS — M5417 Radiculopathy, lumbosacral region: Secondary | ICD-10-CM | POA: Diagnosis not present

## 2015-05-22 DIAGNOSIS — R1084 Generalized abdominal pain: Secondary | ICD-10-CM | POA: Diagnosis not present

## 2015-05-22 DIAGNOSIS — R109 Unspecified abdominal pain: Secondary | ICD-10-CM

## 2015-05-22 NOTE — Progress Notes (Signed)
Internal Medicine Clinic Attending  Case discussed with Dr. Alice Rieger at the time of the visit.  We reviewed the resident's history and exam and pertinent patient test results.  I agree with the assessment, diagnosis, and plan of care documented in the resident's note.  If abdominal pain does not improve after stopping metformin in 1 week, patient should seek medical care.

## 2015-05-22 NOTE — Progress Notes (Signed)
Subjective:   Patient ID: Nancy [name] female   DOB: 15-Mar-1956 59 y.o.   MRN: [id number]  HPI: [name] is a 59 y.o. woman with a past medical history as listed below who presents for ongoing low back pain and abdominal pain.  Low back pain with bilateral lower extremity spasms: Pt states this has not improved in about 4-6 months and is bilateral as an ache and she feels sometimes as if the muscle is "spasming." She was evaluated from here 3 weeks ago and she was referred to physical therapy. She had one session and she reported some improvement with the PT. Unfortunately, her insurance only covers one visit and she is unable to afford it out of pocket. She is in the process of obtaining a TENS unit to continue using as home as this provided some relief during her physical therapy. She has not had any paresthesias, saddle anesthesia, urinary or bowel incontinence. Over the past 2 months she has noticed increasing left-sided lateral pain that radiates down to the lateral aspect of her left foot. She describes it as a tingling sensation. She has not had any leg weakness or changes in her ambulation. She has not done any recent heavy lifting and has not had any back imaging or surgery. She denied any fevers or chills, rashes, chest pain, or shortness of breath.   Abdominal pain: She describes abdominal pain for the past 1 month, associated with nausea, usually after she eats. The pain is generalized, mild in nature but persistent and nonradiating. There is no relieving factors, but tends to increase postprandially. Because of nausea sometimes she has needed to induce vomiting 1-2/wk to get some relief. She also describes associated diarrhea 2-3 times a day without blood or Malena. She has not tried anything at home to relieve his symptoms. She denies urinary symptoms currently. She denies fevers or change in weight or fatigue.   Past Medical History  Diagnosis Date  . Diabetic peripheral  neuropathy   . Carpal tunnel syndrome, bilateral     confirmed on nerve conduction studies  . Hyperlipidemia   . Diabetes mellitus type 2, uncontrolled   . GERD (gastroesophageal reflux disease)   . Heat edema   . History of Helicobacter infection 11/07  . History of chest pain     negative cardiolite September 2007.  ETT-myoview 12/10 with EF 76%, no ischemia,   . History of endometriosis   . Carpal tunnel syndrome    Current Outpatient Prescriptions  Medication Sig Dispense Refill  . ACCU-CHEK FASTCLIX LANCETS MISC Use to check blood sugar up to 3 times a day DX code 250.02 insulin requiring 102 each 5  . aspirin EC 81 MG tablet Take 1 tablet (81 mg total) by mouth daily.    . Blood Glucose Monitoring Suppl (ACCU-CHEK NANO SMARTVIEW) W/DEVICE KIT Use to check blood sugar up to 3 times a day DX code 250.02 insulin requiring 1 kit 0  . carbamide peroxide (DEBROX) 6.5 % otic solution Place 5 drops into both ears 2 (two) times daily. 15 mL 0  . cyclobenzaprine (FLEXERIL) 5 MG tablet Take 1 tablet (5 mg total) by mouth every 8 (eight) hours as needed for muscle spasms. 30 tablet 1  . glucose blood (ACCU-CHEK SMARTVIEW) test strip Use to check blood sugar up to 3 times a day DX code 250.02 insulin requiring 100 each 5  . hydrochlorothiazide (HYDRODIURIL) 25 MG tablet Take 1 tablet (25 mg total) by mouth daily. 90 tablet  3  . insulin glargine (LANTUS) 100 UNIT/ML injection INJECT 58 UNITS INTO THE SKIN EVERY MORNING WHEN YOU WAKE UP 20 mL 5  . losartan (COZAAR) 100 MG tablet Take 1 tablet (100 mg total) by mouth daily. 90 tablet 3  . metFORMIN (GLUCOPHAGE) 1000 MG tablet Take 1 tablet (1,000 mg total) by mouth 2 (two) times daily with a meal. 60 tablet 11  . omeprazole (PRILOSEC) 20 MG capsule Take 1 capsule (20 mg total) by mouth daily. 30 capsule 2   No current facility-administered medications for this visit.   Family History  Problem Relation Age of Onset  . Diabetes Mother   . Colon  cancer Neg Hx   . Lung cancer Maternal Uncle   . Breast cancer Maternal Grandmother   . Liver cancer Maternal Grandfather   . Heart disease Brother    History   Social History  . Marital Status: Single    Spouse Name: N/A  . Number of Children: 0  . Years of Education: 11   Occupational History  . CNA    Social History Main Topics  . Smoking status: Never Smoker   . Smokeless tobacco: Never Used  . Alcohol Use: No  . Drug Use: No  . Sexual Activity: No   Other Topics Concern  . None   Social History Narrative   Financial assistance approved for 100% discount at [facility] and has [facility] card per [employer]   09/26/2010   Review of Systems: Pertinent items are noted in HPI. Objective:  Physical Exam: Filed Vitals:   05/22/15 1016  BP: 132/79  Pulse: 87  Temp: 97.8 F (36.6 C)  TempSrc: Oral  Height: _0  (1.473 m)  Weight: 180 lb 1.6 oz (81.693 kg)  SpO2: 100%   General: sitting in chair, NAD  Cardiac: RRR, no rubs, murmurs or gallops Pulm: clear to auscultation bilaterally, moving normal volumes of air Abd: soft, nontender, nondistended, BS present Ext: warm and well perfused, no pedal edema, no rashes appreciated MSK: 5/5 LE strength, + straight leg raise bilaterally,  moderate tenderness over spinal processes, bilateral paraspinal tenderness, 2+ DTRs throughout, normal sensation, no groin point tenderness.  Assessment & Plan:  Please see problem oriented charting  Pt discussed with Dr. Ellwood Dense

## 2015-05-22 NOTE — Assessment & Plan Note (Signed)
Assessment: Most likely diagnosis side effect of Metformin. Given the chronicity of her diarrhea, other differentials like inflammatory bowel syndrome, and IBS should be considered. Diverticulitis is less likely with generalized abdominal pain.   Plan: 1. Labs/imaging: none 2. Therapy: Discussed the possibility of Metformin-induced abdominal symptoms. The first step will be to stop metformin for about 2 weeks. If her symptoms do not improve, workup for other possible etiologies should be pursued. 3. Follow up in 2 weeks

## 2015-05-22 NOTE — Patient Instructions (Signed)
General Instructions: Try massage, yoga, water erobics and relaxation  Please stop metformin for 2-3 weeks and see if your stomach problems improve. Please come back in 1 month   Please bring your medicines with you each time you come to clinic.  Medicines may include prescription medications, over-the-counter medications, herbal remedies, eye drops, vitamins, or other pills.   Progress Toward Treatment Goals:  Treatment Goal 02/26/2015  Hemoglobin A1C -  Blood pressure deteriorated    Self Care Goals & Plans:  Self Care Goal 05/22/2015  Manage my medications take my medicines as prescribed; bring my medications to every visit; refill my medications on time; follow the sick day instructions if I am sick  Monitor my health keep track of my blood glucose; bring my glucose meter and log to each visit; check my feet daily  Eat healthy foods eat more vegetables; eat fruit for snacks and desserts; drink diet soda or water instead of juice or soda  Be physically active find an activity I enjoy    Home Blood Glucose Monitoring 02/26/2015  Check my blood sugar 3 times a day  When to check my blood sugar before meals     Care Management & Community Referrals:  Referral 05/09/2013  Referrals made for care management support none needed      Muscle Cramps and Spasms Muscle cramps and spasms occur when a muscle or muscles tighten and you have no control over this tightening (involuntary muscle contraction). They are a common problem and can develop in any muscle. The most common place is in the calf muscles of the leg. Both muscle cramps and muscle spasms are involuntary muscle contractions, but they also have differences:   Muscle cramps are sporadic and painful. They may last a few seconds to a quarter of an hour. Muscle cramps are often more forceful and last longer than muscle spasms.  Muscle spasms may or may not be painful. They may also last just a few seconds or much longer. CAUSES    It is uncommon for cramps or spasms to be due to a serious underlying problem. In many cases, the cause of cramps or spasms is unknown. Some common causes are:   Overexertion.   Overuse from repetitive motions (doing the same thing over and over).   Remaining in a certain position for a long period of time.   Improper preparation, form, or technique while performing a sport or activity.   Dehydration.   Injury.   Side effects of some medicines.   Abnormally low levels of the salts and ions in your blood (electrolytes), especially potassium and calcium. This could happen if you are taking water pills (diuretics) or you are pregnant.  Some underlying medical problems can make it more likely to develop cramps or spasms. These include, but are not limited to:   Diabetes.   Parkinson disease.   Hormone disorders, such as thyroid problems.   Alcohol abuse.   Diseases specific to muscles, joints, and bones.   Blood vessel disease where not enough blood is getting to the muscles.  HOME CARE INSTRUCTIONS   Stay well hydrated. Drink enough water and fluids to keep your urine clear or pale yellow.  It may be helpful to massage, stretch, and relax the affected muscle.  For tight or tense muscles, use a warm towel, heating pad, or hot shower water directed to the affected area.  If you are sore or have pain after a cramp or spasm, applying ice to the  affected area may relieve discomfort.  Put ice in a plastic bag.  Place a towel between your skin and the bag.  Leave the ice on for 15-20 minutes, 03-04 times a day.  Medicines used to treat a known cause of cramps or spasms may help reduce their frequency or severity. Only take over-the-counter or prescription medicines as directed by your caregiver. SEEK MEDICAL CARE IF:  Your cramps or spasms get more severe, more frequent, or do not improve over time.  MAKE SURE YOU:   Understand these instructions.  Will  watch your condition.  Will get help right away if you are not doing well or get worse. Document Released: 05/15/2002 Document Revised: 03/20/2013 Document Reviewed: 11/09/2012 [facility] Patient Information 2015 [place], [place]. This information is not intended to replace advice given to you by your health care provider. Make sure you discuss any questions you have with your health care provider.

## 2015-05-22 NOTE — Assessment & Plan Note (Signed)
Lab Results  Component Value Date   HGBA1C 6.8 03/06/2015   HGBA1C 6.7 12/10/2014   HGBA1C 11.0 10/02/2014     Assessment: Diabetes control:   Progress toward A1C goal:    Comments: pt has very good control, but I believe her metformin is causing abdominal symptoms.  Plan: Medications:  Continue with Lantus 58 units. Hold Metformin for 2 weeks to see if her abdominal complaints improved. Home glucose monitoring: Frequency:   Timing:   Instruction/counseling given: discussed diet Educational resources provided: brochure, handout Self management tools provided: copy of home glucose meter download Other plans: routine follow up Consider an alternative agent in place of metformin if it is confirmed to be the cause of her abdominal symptoms.

## 2015-05-22 NOTE — Assessment & Plan Note (Signed)
Pt symptoms and PE findings support ongoing radiculopathy. She does not have any red flag symptoms that would warrant immediate surgical evaluation. Lumbar spine x-ray performed 3 weeks ago revealed mild degenerative changes. She had some good results from PT, however, her insurance does not cover this and she will be unable to continue. A TENS unit was recommended. Patient has not had any symptomatically management. Plan  - TENS unit, also recommended massage, water earobics, relaxation and gentle exercise. - cont with Flexeril and ibuprofen every 6 hours prn  -Follow-up in 2-4 weeks for improvement

## 2015-06-05 ENCOUNTER — Ambulatory Visit (INDEPENDENT_AMBULATORY_CARE_PROVIDER_SITE_OTHER): Payer: Medicaid Other | Admitting: Internal Medicine

## 2015-06-05 ENCOUNTER — Encounter: Payer: Self-pay | Admitting: Internal Medicine

## 2015-06-05 VITALS — BP 147/91 | HR 82 | Temp 97.7°F | Resp 18 | Ht <= 58 in | Wt 181.9 lb

## 2015-06-05 DIAGNOSIS — E1165 Type 2 diabetes mellitus with hyperglycemia: Secondary | ICD-10-CM

## 2015-06-05 DIAGNOSIS — M5417 Radiculopathy, lumbosacral region: Secondary | ICD-10-CM

## 2015-06-05 DIAGNOSIS — Z794 Long term (current) use of insulin: Secondary | ICD-10-CM

## 2015-06-05 DIAGNOSIS — R109 Unspecified abdominal pain: Secondary | ICD-10-CM

## 2015-06-05 DIAGNOSIS — E1140 Type 2 diabetes mellitus with diabetic neuropathy, unspecified: Secondary | ICD-10-CM | POA: Diagnosis not present

## 2015-06-05 DIAGNOSIS — E1149 Type 2 diabetes mellitus with other diabetic neurological complication: Secondary | ICD-10-CM

## 2015-06-05 LAB — GLUCOSE, CAPILLARY: GLUCOSE-CAPILLARY: 189 mg/dL — AB (ref 65–99)

## 2015-06-05 LAB — POCT GLYCOSYLATED HEMOGLOBIN (HGB A1C): HEMOGLOBIN A1C: 7.8

## 2015-06-05 MED ORDER — GLIPIZIDE ER 5 MG PO TB24: 5.0000 mg | ORAL_TABLET | Freq: Every day | ORAL | Status: DC

## 2015-06-05 NOTE — Progress Notes (Signed)
Subjective:    Patient ID: Nancy [name], female    DOB: 07/06/1956, 59 y.o.   MRN: [id number]  HPI Comments: [name] is a 59 year old woman with PMH as below here for follow-up of abdominal pain and low back pain.  Please see problem based charting for assessment and plan.     Past Medical History  Diagnosis Date  . Diabetic peripheral neuropathy   . Carpal tunnel syndrome, bilateral     confirmed on nerve conduction studies  . Hyperlipidemia   . Diabetes mellitus type 2, uncontrolled   . GERD (gastroesophageal reflux disease)   . Heat edema   . History of Helicobacter infection 11/07  . History of chest pain     negative cardiolite September 2007.  ETT-myoview 12/10 with EF 76%, no ischemia,   . History of endometriosis   . Carpal tunnel syndrome    Current Outpatient Prescriptions on File Prior to Visit  Medication Sig Dispense Refill  . ACCU-CHEK FASTCLIX LANCETS MISC Use to check blood sugar up to 3 times a day DX code 250.02 insulin requiring 102 each 5  . aspirin EC 81 MG tablet Take 1 tablet (81 mg total) by mouth daily.    . Blood Glucose Monitoring Suppl (ACCU-CHEK NANO SMARTVIEW) W/DEVICE KIT Use to check blood sugar up to 3 times a day DX code 250.02 insulin requiring 1 kit 0  . carbamide peroxide (DEBROX) 6.5 % otic solution Place 5 drops into both ears 2 (two) times daily. 15 mL 0  . cyclobenzaprine (FLEXERIL) 5 MG tablet Take 1 tablet (5 mg total) by mouth every 8 (eight) hours as needed for muscle spasms. 30 tablet 1  . glucose blood (ACCU-CHEK SMARTVIEW) test strip Use to check blood sugar up to 3 times a day DX code 250.02 insulin requiring 100 each 5  . hydrochlorothiazide (HYDRODIURIL) 25 MG tablet Take 1 tablet (25 mg total) by mouth daily. 90 tablet 3  . insulin glargine (LANTUS) 100 UNIT/ML injection INJECT 58 UNITS INTO THE SKIN EVERY MORNING WHEN YOU WAKE UP 20 mL 5  . losartan (COZAAR) 100 MG tablet Take 1 tablet (100 mg total) by mouth daily. 90  tablet 3  . metFORMIN (GLUCOPHAGE) 1000 MG tablet Take 1 tablet (1,000 mg total) by mouth 2 (two) times daily with a meal. 60 tablet 11  . omeprazole (PRILOSEC) 20 MG capsule Take 1 capsule (20 mg total) by mouth daily. 30 capsule 2   No current facility-administered medications on file prior to visit.    Review of Systems  Constitutional: Negative for fever, chills, appetite change and unexpected weight change.  Respiratory: Negative for cough and shortness of breath.   Cardiovascular: Negative for chest pain, palpitations and leg swelling.  Gastrointestinal: Negative for nausea, vomiting, abdominal pain, diarrhea and constipation.  Musculoskeletal: Positive for back pain.       Filed Vitals:   06/05/15 1354  BP: 147/91  Pulse: 82  Temp: 97.7 F (36.5 C)  TempSrc: Oral  Resp: 18  Height: '4\' 10"'  (1.473 m)  Weight: 181 lb 14.4 oz (82.509 kg)  SpO2: 100%   Objective:   Physical Exam  Constitutional: She is oriented to person, place, and time. She appears well-developed. No distress.  HENT:  Head: Normocephalic and atraumatic.  Mouth/Throat: Oropharynx is clear and moist. No oropharyngeal exudate.  Eyes: EOM are normal. Pupils are equal, round, and reactive to light.  Neck: Neck supple.  Cardiovascular: Normal rate, regular rhythm and normal  heart sounds.  Exam reveals no gallop and no friction rub.   No murmur heard. Pulmonary/Chest: Effort normal and breath sounds normal. No respiratory distress. She has no wheezes. She has no rales.  Abdominal: Soft. Bowel sounds are normal. She exhibits no distension. There is no tenderness. There is no rebound.  Musculoskeletal: Normal range of motion. She exhibits edema. She exhibits no tenderness.  Lumbar paraspinal musculature with mild TTP.  There is no spinal tenderness.  Neurological: She is alert and oriented to person, place, and time. No cranial nerve deficit.  Skin: Skin is warm. She is not diaphoretic.  Psychiatric: She has a  normal mood and affect. Her behavior is normal.  Vitals reviewed.         Assessment & Plan:  Please see problem based charting for assessment and plan.

## 2015-06-05 NOTE — Patient Instructions (Signed)
1. I am glad your stomach pain is gone and your back pain is doing better.  Remember, if you start to have severe back pain, trouble walking, lose control of your bowel or bladder, lose feeling below your waist, please call 911.  Continue to use heat, massage, ibuprofen, Tylenol, flexeril as needed.  Flexeril may make you sleepy so you may want to only use it at night or when you know you do not need to drive anywhere.  Do not take medications if you don't need to.  Continue the stretching exercises you learned in PT    2. Please take all medications as prescribed.  I have prescribed glipizide 5mg  daily to replace your metformin.  Please check your blood sugar 2-3x per day.  Call the clinic if you have blood sugars below 80 or if you feel poorly after you start glipizide.     3. If you have worsening of your symptoms or new symptoms arise, please call the clinic [phone]), or go to the ER immediately if symptoms are severe.   Return to clinic to see your new doctor in 1 month.  Come back sooner if you have any problems.

## 2015-06-07 NOTE — Progress Notes (Signed)
INTERNAL MEDICINE TEACHING ATTENDING ADDENDUM - Avo Schlachter, MD: I reviewed and discussed at the time of visit with the resident Dr. Wilson, the patient's medical history, physical examination, diagnosis and results of pertinent tests and treatment and I agree with the patient's care as documented.  

## 2015-06-07 NOTE — Assessment & Plan Note (Signed)
Her abdominal pain and associated symptoms completely resolved a few days after she stopped metformin.  Though unusual that she would have tolerated it for so many years and then develop intolerance, will keep her off of this medication and prescribe alternative, glipizide for DM control.

## 2015-06-07 NOTE — Assessment & Plan Note (Addendum)
Lab Results  Component Value Date   HGBA1C 7.8 06/05/2015   HGBA1C 6.8 03/06/2015   HGBA1C 6.7 12/10/2014     Assessment: Diabetes control:  uncontrolled Progress toward A1C goal:   worsened Comments: Her prior A1c was 6.8 but now 7.8.  Metformin was stopped only two weeks ago (for GI symptoms) so this is not all due to that change in therapy.  Logs reviewed and there are 30 readings for the past 30 days (she checks qAM).  Highest is 298.  There is an isolated low of 65 (she does not remember what may have precipitated that low).  There are many readings > 200 but the average is 154.  Plan: Medications:  continue current medications:  Lantus 58 units daily.  START glipizide XL 5mg  daily (to replace metformin that was stopped two weeks ago). Home glucose monitoring:   Frequency:   INCREASE to 2-3x daily while starting glipizide.  Can reduce back down after she knows how glipizide affects her CBG. Timing:   Instruction/counseling given: reminded to bring blood glucose meter & log to each visit Educational resources provided: other (see comments) (denied) Other plans:  She was advised to call [facility] if CBGs below 80 or if she feels hypoglycemic on new med regimen.  RTC in 1 month for follow-up and further titration of medications as needed.

## 2015-06-07 NOTE — Assessment & Plan Note (Addendum)
She denies loss of bowel or bladder control, difficulty walking or saddle anesthesia.  Her exam is reassuring.  Her back pain is responding to conservative therapy.  She found the TENS unit at PT particularly helpful.   - she will continue prn flexeril (advised to only use at night if it makes her drowsy) and prn ibuprofen, can also try Tylenol - continue stretching exercises that she was taught at PT - she will call PT regarding getting TENS unit - she was advised of red flags that would warrant going to ED for emergent evaluation - she will return to clinic in 1 month for follow-up with new PCP

## 2015-06-13 ENCOUNTER — Telehealth: Payer: Self-pay | Admitting: Internal Medicine

## 2015-06-13 NOTE — Telephone Encounter (Signed)
Called pt back, scheduled an appt w/ dr Redmond Pulling for tomorrow Friday 7/8 dr Redmond Pulling, she wants to discuss blood sugars and medicine

## 2015-06-13 NOTE — Telephone Encounter (Signed)
Patient has a question about her medication diabetes

## 2015-06-14 ENCOUNTER — Encounter: Payer: Self-pay | Admitting: Internal Medicine

## 2015-06-14 ENCOUNTER — Ambulatory Visit (INDEPENDENT_AMBULATORY_CARE_PROVIDER_SITE_OTHER): Payer: Medicaid Other | Admitting: Internal Medicine

## 2015-06-14 VITALS — BP 152/81 | HR 76 | Temp 97.7°F | Wt 182.4 lb

## 2015-06-14 DIAGNOSIS — Z794 Long term (current) use of insulin: Secondary | ICD-10-CM

## 2015-06-14 DIAGNOSIS — E1165 Type 2 diabetes mellitus with hyperglycemia: Secondary | ICD-10-CM | POA: Diagnosis not present

## 2015-06-14 DIAGNOSIS — I10 Essential (primary) hypertension: Secondary | ICD-10-CM | POA: Diagnosis not present

## 2015-06-14 DIAGNOSIS — E1149 Type 2 diabetes mellitus with other diabetic neurological complication: Secondary | ICD-10-CM

## 2015-06-14 DIAGNOSIS — E1140 Type 2 diabetes mellitus with diabetic neuropathy, unspecified: Secondary | ICD-10-CM

## 2015-06-14 DIAGNOSIS — Z0000 Encounter for general adult medical examination without abnormal findings: Secondary | ICD-10-CM

## 2015-06-14 LAB — GLUCOSE, CAPILLARY: GLUCOSE-CAPILLARY: 239 mg/dL — AB (ref 65–99)

## 2015-06-14 MED ORDER — INSULIN GLARGINE 100 UNIT/ML ~~LOC~~ SOLN: SUBCUTANEOUS | Status: DC

## 2015-06-14 MED ORDER — METFORMIN HCL 500 MG PO TABS: 500.0000 mg | ORAL_TABLET | Freq: Every day | ORAL | Status: DC

## 2015-06-14 NOTE — Progress Notes (Signed)
Patient ID: Nancy [name], female   DOB: 1956/04/20, 59 y.o.   MRN: [id number]    Subjective:   Patient ID: Nancy [name] female   DOB: 1956-11-08 59 y.o.   MRN: [id number]  HPI: [name] is a 59 y.o. very pleasant woman with past medical history of hypertension, hyperlipidemia, insulin-dependent Type 2 DM, history of H pylori infection, bilateral carpal tunnel syndrome, chronic low back pain, and GERD who presents for follow-up of Type 2 DM.   Her last A1c was 7.8 on 05/09/15 that had worsened from 6.7 on 12/10/14. Due to abdominal pain, nausea, and diarrhea for 1 month she was taken off metformin 1000 mg BID on 05/22/15 which she reports being on for years. She reports her symptoms resolved and was then placed on glipizide XL 5 mg daily on 06/05/15 which she has been compliant with in addition to Lantus 58 U daily. She checks her blood sugar twice daily and brought her meter today which shows average in the high 200's. She denies symptomatic hypoglycemia. She has chronic blurry vision, polydipsia, polyuria, polyphagia, and neuropathy but denies foot injury/ulcer. She admits her diet is not good but does walk regularly. She has gained 1 lb since last visit.    She is compliant with taking losartan and HCTZ for hypertension. She denies headache, chest pain, lightheadedness, or LE edema.     Past Medical History  Diagnosis Date  . Diabetic peripheral neuropathy   . Carpal tunnel syndrome, bilateral     confirmed on nerve conduction studies  . Hyperlipidemia   . Diabetes mellitus type 2, uncontrolled   . GERD (gastroesophageal reflux disease)   . Heat edema   . History of Helicobacter infection 11/07  . History of chest pain     negative cardiolite September 2007.  ETT-myoview 12/10 with EF 76%, no ischemia,   . History of endometriosis   . Carpal tunnel syndrome    Current Outpatient Prescriptions  Medication Sig Dispense Refill  . ACCU-CHEK FASTCLIX LANCETS MISC Use to check  blood sugar up to 3 times a day DX code 250.02 insulin requiring 102 each 5  . aspirin EC 81 MG tablet Take 1 tablet (81 mg total) by mouth daily.    . Blood Glucose Monitoring Suppl (ACCU-CHEK NANO SMARTVIEW) W/DEVICE KIT Use to check blood sugar up to 3 times a day DX code 250.02 insulin requiring 1 kit 0  . carbamide peroxide (DEBROX) 6.5 % otic solution Place 5 drops into both ears 2 (two) times daily. 15 mL 0  . cyclobenzaprine (FLEXERIL) 5 MG tablet Take 1 tablet (5 mg total) by mouth every 8 (eight) hours as needed for muscle spasms. 30 tablet 1  . glipiZIDE (GLUCOTROL XL) 5 MG 24 hr tablet Take 1 tablet (5 mg total) by mouth daily with breakfast. 30 tablet 3  . glucose blood (ACCU-CHEK SMARTVIEW) test strip Use to check blood sugar up to 3 times a day DX code 250.02 insulin requiring 100 each 5  . hydrochlorothiazide (HYDRODIURIL) 25 MG tablet Take 1 tablet (25 mg total) by mouth daily. 90 tablet 3  . insulin glargine (LANTUS) 100 UNIT/ML injection INJECT 58 UNITS INTO THE SKIN EVERY MORNING WHEN YOU WAKE UP 20 mL 5  . losartan (COZAAR) 100 MG tablet Take 1 tablet (100 mg total) by mouth daily. 90 tablet 3  . omeprazole (PRILOSEC) 20 MG capsule Take 1 capsule (20 mg total) by mouth daily. 30 capsule 2   No current facility-administered medications for  this visit.   Family History  Problem Relation Age of Onset  . Diabetes Mother   . Colon cancer Neg Hx   . Lung cancer Maternal Uncle   . Breast cancer Maternal Grandmother   . Liver cancer Maternal Grandfather   . Heart disease Brother    History   Social History  . Marital Status: Single    Spouse Name: N/A  . Number of Children: 0  . Years of Education: 11   Occupational History  . CNA    Social History Main Topics  . Smoking status: Never Smoker   . Smokeless tobacco: Never Used  . Alcohol Use: No  . Drug Use: No  . Sexual Activity: Not on file   Other Topics Concern  . None   Social History Narrative   Financial  assistance approved for 100% discount at [facility] and has [facility] card per [employer]   09/26/2010   Review of Systems: Review of Systems  Constitutional: Negative for fever and chills.  Eyes: Positive for blurred vision (chronic).  Respiratory: Negative for cough, shortness of breath and wheezing.   Cardiovascular: Negative for chest pain and leg swelling.  Gastrointestinal: Positive for heartburn (conrolled on PPI) and constipation. Negative for nausea, vomiting, abdominal pain and diarrhea.  Genitourinary: Negative for dysuria, urgency and frequency.       Chronic polyuria  Musculoskeletal:       Knot behind left LE  Neurological: Positive for sensory change (chronic peripheral neuropathy ). Negative for dizziness and headaches.  Endo/Heme/Allergies: Positive for polydipsia (chronic).    Objective:  Physical Exam: Filed Vitals:   06/14/15 1019  BP: 152/81  Pulse: 76  Temp: 97.7 F (36.5 C)  TempSrc: Oral  Weight: 182 lb 6.4 oz (82.736 kg)  SpO2: 100%    Physical Exam  Constitutional: She is oriented to person, place, and time. She appears well-developed and well-nourished. No distress.  HENT:  Head: Normocephalic and atraumatic.  Right Ear: External ear normal.  Left Ear: External ear normal.  Nose: Nose normal.  Mouth/Throat: Oropharynx is clear and moist. No oropharyngeal exudate.  Eyes: Conjunctivae and EOM are normal. Pupils are equal, round, and reactive to light. Right eye exhibits no discharge. Left eye exhibits no discharge. No scleral icterus.  Neck: Normal range of motion. Neck supple.  Cardiovascular: Normal rate, regular rhythm and normal heart sounds.   Pulmonary/Chest: Effort normal and breath sounds normal. No respiratory distress. She has no wheezes. She has no rales.  Abdominal: Soft. Bowel sounds are normal. She exhibits no distension. There is no tenderness. There is no rebound and no guarding.  Musculoskeletal: Normal range of motion. She exhibits  tenderness (Palpable knot left popliteal region). She exhibits no edema.  Neurological: She is alert and oriented to person, place, and time.  Skin: Skin is warm and dry. No rash noted. She is not diaphoretic. No erythema. No pallor.  Psychiatric: She has a normal mood and affect. Her behavior is normal. Judgment and thought content normal.    Assessment & Plan:   Please see problem list for problem-based assessment and plan

## 2015-06-14 NOTE — Patient Instructions (Signed)
-  STOP taking glipizide and START taking metformin 500 mg daily, if after 1 week you tolerate it well, you can increase it to twice a day -Increase your Lantus from 58 U to 62 U daily  -We may need to add another blood pressure medication next time  -Please bring your meter at your next visit on July 21st with Dr. Marijean Bravo, very nice seeing you again!  General Instructions:   Thank you for bringing your medicines today. This helps [place] keep you safe from mistakes.   Progress Toward Treatment Goals:  Treatment Goal 02/26/2015  Hemoglobin A1C -  Blood pressure deteriorated    Self Care Goals & Plans:  Self Care Goal 06/14/2015  Manage my medications bring my medications to every visit; refill my medications on time; take my medicines as prescribed  Monitor my health keep track of my blood glucose; bring my glucose meter and log to each visit; check my feet daily; keep track of my blood pressure  Eat healthy foods eat more vegetables; eat foods that are low in salt; eat baked foods instead of fried foods  Be physically active find an activity I enjoy; take a walk every day    Home Blood Glucose Monitoring 02/26/2015  Check my blood sugar 3 times a day  When to check my blood sugar before meals     Care Management & Community Referrals:  Referral 05/09/2013  Referrals made for care management support none needed

## 2015-06-16 NOTE — Assessment & Plan Note (Signed)
Assessment: Pt with moderately well-controlled hypertension compliant with two-class (ARB & diuretic) anti-hypertensive therapy who presents with blood pressure of 152/81.   Plan:  -BP 152/81 not at goal <140/90 -Continue losartan 100 mg daily and HCTZ 25 mg daily  -Consider adding diltiazem at next visit in setting of proteinuria (1.2 g) -Last CMP normal on 03/06/15

## 2015-06-16 NOTE — Assessment & Plan Note (Signed)
Consider HCV Ab screening at next visit (born [birth year]).

## 2015-06-16 NOTE — Assessment & Plan Note (Addendum)
Assessment: Pt with last A1c of 7.8 on 06/05/15 compliant with insulin and newly changed hypoglycemic therapy with no recent symptomatic hypoglycemia who presents with CBG of 239.   Plan:  -A1c 7.8 not at goal <7, Increase Lantus 58 U daily to 62 U daily. Will discontinue glipizide 5 mg daily and restart low dose metformin 500 mg daily and increase to 500 mg BID after 1 week if tolerates. Pt to return with meter on 06/27/15 with PCP for further adjustment.   -BP 152/81 not at goal <140/90, consider adding diltiazem at next visit in setting of proteinuria. Continue losartan 100 mg daily and HCTZ 25 mg daily. -LDL 101 not at goal <100, consider starting pravastatin 40 mg daily at next visit (had myalgias with lipitor).  -Last annual eye exam on 10/16/14 with no retinopathy  -Perform annual foot exam  -Last annual urine microalbumin on 12/10/14 with 1.2 of proteinuria, consider adding diltiazem at next visit in setting of uncontrolled hypertension -BMI 38.13 not at goal <25, encourage weight

## 2015-06-17 ENCOUNTER — Other Ambulatory Visit: Payer: Self-pay | Admitting: Internal Medicine

## 2015-06-17 NOTE — Progress Notes (Signed)
Internal Medicine Clinic Attending  Case discussed with Dr. Rabbani soon after the resident saw the patient.  We reviewed the resident's history and exam and pertinent patient test results.  I agree with the assessment, diagnosis, and plan of care documented in the resident's note.  

## 2015-06-24 ENCOUNTER — Other Ambulatory Visit: Payer: Self-pay | Admitting: Internal Medicine

## 2015-06-27 ENCOUNTER — Ambulatory Visit (HOSPITAL_COMMUNITY)
Admission: RE | Admit: 2015-06-27 | Discharge: 2015-06-27 | Disposition: A | Discharge status: Home / Self Care | Payer: Medicaid Other | Source: Ambulatory Visit | Attending: Internal Medicine | Admitting: Internal Medicine

## 2015-06-27 ENCOUNTER — Ambulatory Visit (INDEPENDENT_AMBULATORY_CARE_PROVIDER_SITE_OTHER): Payer: Medicaid Other | Admitting: Internal Medicine

## 2015-06-27 ENCOUNTER — Encounter: Payer: Self-pay | Admitting: Internal Medicine

## 2015-06-27 VITALS — BP 139/83 | HR 98 | Temp 97.9°F | Ht <= 58 in | Wt 182.9 lb

## 2015-06-27 DIAGNOSIS — G8929 Other chronic pain: Secondary | ICD-10-CM | POA: Diagnosis not present

## 2015-06-27 DIAGNOSIS — I10 Essential (primary) hypertension: Secondary | ICD-10-CM | POA: Diagnosis not present

## 2015-06-27 DIAGNOSIS — Z794 Long term (current) use of insulin: Secondary | ICD-10-CM | POA: Diagnosis not present

## 2015-06-27 DIAGNOSIS — M79601 Pain in right arm: Secondary | ICD-10-CM | POA: Insufficient documentation

## 2015-06-27 DIAGNOSIS — G5601 Carpal tunnel syndrome, right upper limb: Secondary | ICD-10-CM | POA: Diagnosis not present

## 2015-06-27 DIAGNOSIS — G5603 Carpal tunnel syndrome, bilateral upper limbs: Secondary | ICD-10-CM

## 2015-06-27 DIAGNOSIS — M79602 Pain in left arm: Secondary | ICD-10-CM | POA: Insufficient documentation

## 2015-06-27 DIAGNOSIS — E1149 Type 2 diabetes mellitus with other diabetic neurological complication: Secondary | ICD-10-CM

## 2015-06-27 DIAGNOSIS — M7989 Other specified soft tissue disorders: Secondary | ICD-10-CM

## 2015-06-27 DIAGNOSIS — G5602 Carpal tunnel syndrome, left upper limb: Secondary | ICD-10-CM

## 2015-06-27 DIAGNOSIS — M25522 Pain in left elbow: Secondary | ICD-10-CM | POA: Diagnosis present

## 2015-06-27 DIAGNOSIS — E1140 Type 2 diabetes mellitus with diabetic neuropathy, unspecified: Secondary | ICD-10-CM

## 2015-06-27 LAB — GLUCOSE, CAPILLARY: Glucose-Capillary: 95 mg/dL (ref 65–99)

## 2015-06-27 MED ORDER — METFORMIN HCL 500 MG PO TABS: 1000.0000 mg | ORAL_TABLET | Freq: Every day | ORAL | Status: DC

## 2015-06-27 MED ORDER — INSULIN GLARGINE 100 UNIT/ML ~~LOC~~ SOLN: SUBCUTANEOUS | Status: DC

## 2015-06-27 NOTE — Patient Instructions (Signed)
Great to meet you today. Please have your blood work and neck xray finished before you leave. We also decreased your lantus dose to 58 and rewrote your precription for metform. I would like to see you back in two months.
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
## 2015-06-27 NOTE — Assessment & Plan Note (Addendum)
Nancy [name] complains of a longstanding history of bilateral arm pain and swelling that has been acutely worsening over the last month. She noticed that about 10 years that there was a chronic dull pain in her left arm and hand, associated with a tingling sensation and numbness in her fingers. Nerve conduction studies confirmed slow conduction, consistent with the diagnosis of carpal tunnel. Surgery was performed, but she continues to endorse pain in both arms. 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 Her symptoms worsened to the point of not being able to perform her work duties, and she has not had that job for 3 years. Pertinent history includes RA in her maternal grandmother and SLE in a cousin. On exam, she exhibits exquisite tenderness to palpation from her wrist up to her mid-humerus bilaterally, localized areas of swelling on her left arm, reduced range of motion especially in her wrist, and mild effacement of her MCP joints. The differential diagnosis includes radial head injury, cervical radiculopathy, rheumatoid arthritis, or autoimmune disorders with musculoskeletal sequelae. However, it is noted that localized swelling would be inconsistent with a neuropathy.  Plan: - Xray of elbow and cervical spine - Rheumatoid factor, anti-CCP, ESR - Will discuss findings with patient in 2 months for A1C check

## 2015-06-27 NOTE — Assessment & Plan Note (Signed)
Nancy [name] confirms that her blood sugar has been volatile over the past few months. In June, she stopped taking Metformin temporarily due to new onset GI discomfort, which has since subsided. She believes, in retrospect, that the GI pain was due to taking her 2nd dose of Metformin on an empty stomach, which she chose to do because her "blood sugars we high earlier in the day, so I did not want to eat anything." She was started on Glipizide, but did not feel her BG was well controlled on that as her BGs were routinely in the 200s, so this was discontinued 7/10. Concurrently, her Lantus dose was increased in July from 58U to 62U and was restarted on metformin 500 mg once daily. She said during this time, she was eating more due to the proximity to the July 4 holiday. Since then, she reports BG levels at low as the 60s in the morning, and has opted to take metformin two tablets of 500 mg in the morning, since she reports taking at night causes GI discomfort. She recently endorses episodes of tachycardia, sweating and fatigue.  It appears since her dietary habits have normalized since the holiday, her Lantus regimen is supratherapeutic and is causing hypoglycemic symptoms. - Decrease Lantus to 58U daily  She is tolerating 1000 mg of Metformin well in the morning, but the pharmacokinetics are not favorable. -Switch to two tablets of 500 mg Metformin extended release once daily. - Will conduct A1C check in two months.

## 2015-06-27 NOTE — Assessment & Plan Note (Signed)
Blood pressure well controlled. No changes needed.

## 2015-06-28 LAB — SEDIMENTATION RATE: Sed Rate: 28 mm/hr (ref 0–30)

## 2015-06-28 LAB — RHEUMATOID FACTOR: Rhuematoid fact SerPl-aCnc: 39 IU/mL — ABNORMAL HIGH (ref <=14)

## 2015-06-28 LAB — CYCLIC CITRUL PEPTIDE ANTIBODY, IGG: Cyclic Citrullin Peptide Ab: <2 U/mL (ref 0.0–5.0)

## 2015-07-01 ENCOUNTER — Telehealth: Payer: Self-pay | Admitting: Internal Medicine

## 2015-07-01 NOTE — Telephone Encounter (Signed)
Patient asking if the results from her xray and blood test are back.

## 2015-07-01 NOTE — Telephone Encounter (Signed)
Pt calls and would like the results of her labs and imaging and results explained, please call her at the ph# on her chart

## 2015-07-01 NOTE — Progress Notes (Signed)
Internal Medicine Clinic Attending  I saw and evaluated the patient.  I personally confirmed the key portions of the history and exam documented by Dr. Ford and I reviewed pertinent patient test results.  The assessment, diagnosis, and plan were formulated together and I agree with the documentation in the resident's note. 

## 2015-07-19 ENCOUNTER — Other Ambulatory Visit: Payer: Self-pay | Admitting: Internal Medicine

## 2015-07-19 DIAGNOSIS — E1149 Type 2 diabetes mellitus with other diabetic neurological complication: Secondary | ICD-10-CM

## 2015-07-29 ENCOUNTER — Other Ambulatory Visit: Payer: Self-pay | Admitting: Internal Medicine

## 2015-07-29 DIAGNOSIS — K219 Gastro-esophageal reflux disease without esophagitis: Secondary | ICD-10-CM

## 2015-08-02 ENCOUNTER — Telehealth: Payer: Self-pay | Admitting: *Deleted

## 2015-08-02 ENCOUNTER — Encounter (HOSPITAL_COMMUNITY): Payer: Self-pay | Admitting: Emergency Medicine

## 2015-08-02 ENCOUNTER — Emergency Department (HOSPITAL_COMMUNITY)
Admission: EM | Admit: 2015-08-02 | Discharge: 2015-08-02 | Disposition: A | Discharge status: Home / Self Care | Payer: Medicaid Other | Attending: Emergency Medicine | Admitting: Emergency Medicine

## 2015-08-02 DIAGNOSIS — Z8742 Personal history of other diseases of the female genital tract: Secondary | ICD-10-CM | POA: Diagnosis not present

## 2015-08-02 DIAGNOSIS — E1342 Other specified diabetes mellitus with diabetic polyneuropathy: Secondary | ICD-10-CM | POA: Diagnosis not present

## 2015-08-02 DIAGNOSIS — Z794 Long term (current) use of insulin: Secondary | ICD-10-CM | POA: Insufficient documentation

## 2015-08-02 DIAGNOSIS — K219 Gastro-esophageal reflux disease without esophagitis: Secondary | ICD-10-CM | POA: Insufficient documentation

## 2015-08-02 DIAGNOSIS — Z7982 Long term (current) use of aspirin: Secondary | ICD-10-CM | POA: Insufficient documentation

## 2015-08-02 DIAGNOSIS — R21 Rash and other nonspecific skin eruption: Secondary | ICD-10-CM | POA: Insufficient documentation

## 2015-08-02 DIAGNOSIS — Z8669 Personal history of other diseases of the nervous system and sense organs: Secondary | ICD-10-CM | POA: Insufficient documentation

## 2015-08-02 DIAGNOSIS — Z79899 Other long term (current) drug therapy: Secondary | ICD-10-CM | POA: Diagnosis not present

## 2015-08-02 MED ORDER — PREDNISONE 20 MG PO TABS: 40.0000 mg | ORAL_TABLET | Freq: Every day | ORAL | Status: DC

## 2015-08-02 MED ORDER — DIPHENHYDRAMINE HCL 25 MG PO TABS: 25.0000 mg | ORAL_TABLET | Freq: Four times a day (QID) | ORAL | Status: DC

## 2015-08-02 MED ORDER — FAMOTIDINE 20 MG PO TABS
20.0000 mg | ORAL_TABLET | Freq: Once | ORAL | Status: AC
  Administered 2015-08-02: 20 mg via ORAL
  Filled 2015-08-02: qty 1

## 2015-08-02 MED ORDER — PREDNISONE 20 MG PO TABS
60.0000 mg | ORAL_TABLET | Freq: Once | ORAL | Status: AC
  Administered 2015-08-02: 60 mg via ORAL
  Filled 2015-08-02: qty 3

## 2015-08-02 MED ORDER — DIPHENHYDRAMINE HCL 25 MG PO CAPS
25.0000 mg | ORAL_CAPSULE | Freq: Once | ORAL | Status: AC
  Administered 2015-08-02: 25 mg via ORAL
  Filled 2015-08-02: qty 1

## 2015-08-02 NOTE — Telephone Encounter (Signed)
Pt walked in to clinic this AM - itching all over body past 2-3 weeks.  Pt states fine bumps all over body. There are no open appt in clinic today - suggest ER or Urgent Care. Appt was made for 08/05/15 9:15AM Dr Naaman Plummer - suggest Benadryl or Aveeno powder for bath water. Hilda Blades [place] RN 08/02/15 10:40AM

## 2015-08-02 NOTE — ED Notes (Signed)
Pt reports itching to arms, chest, lower abd and groin. Pts airway intact. NAD at this time. Pt alert x4.

## 2015-08-02 NOTE — ED Provider Notes (Signed)
CSN: [id number]     Arrival date & time 08/02/15  1041 History   First MD Initiated Contact with Patient 08/02/15 1105     Chief Complaint  Patient presents with  . Rash     (Consider location/radiation/quality/duration/timing/severity/associated sxs/prior Treatment) HPI  Patient is a 59 year old female with history of diabetes, who presents to the emergency department after having approximately 3 weeks of rash which began after getting her hair done at any salon using new products. She states that the rash began bilaterally on her arms, with very fine bumps that are itchy, and have gradually spread up her arms.  She hasn't tried treating it with anything other than a topical cream which the pharmacist recommended, where she saw no improvement after using.  She denies having any rash or swelling of her face lips tongue or airway. She states that she does not have any sensation of throat closing, or any difficulty breathing, swallowing or speaking. She has not had any shortness of breath, wheeze, nausea or vomiting.    Past Medical History  Diagnosis Date  . Diabetic peripheral neuropathy   . Carpal tunnel syndrome, bilateral     confirmed on nerve conduction studies  . Hyperlipidemia   . Diabetes mellitus type 2, uncontrolled   . GERD (gastroesophageal reflux disease)   . Heat edema   . History of Helicobacter infection 11/07  . History of chest pain     negative cardiolite September 2007.  ETT-myoview 12/10 with EF 76%, no ischemia,   . History of endometriosis   . Carpal tunnel syndrome    Past Surgical History  Procedure Laterality Date  . Abdominal hysterectomy    . Carpal tunnel release     Family History  Problem Relation Age of Onset  . Diabetes Mother   . Colon cancer Neg Hx   . Lung cancer Maternal Uncle   . Breast cancer Maternal Grandmother   . Liver cancer Maternal Grandfather   . Heart disease Brother    Social History  Substance Use Topics  . Smoking status:  Never Smoker   . Smokeless tobacco: Never Used  . Alcohol Use: No   OB History    No data available     Review of Systems 10 Systems reviewed and are negative for acute change except as noted in the HPI.      Allergies  Ace inhibitors and Amitriptyline hcl  Home Medications   Prior to Admission medications   Medication Sig Start Date End Date Taking? Authorizing Provider  ACCU-CHEK FASTCLIX LANCETS MISC Use to check blood sugar up to 3 times a day DX code 250.02 insulin requiring 05/11/14   Wilber Oliphant, MD  ACCU-CHEK SMARTVIEW test strip USE TO CHECK BLOOD SUGAR UP TO 3 TIMES A DAY 07/19/15   Liberty Handy, MD  aspirin EC 81 MG tablet Take 1 tablet (81 mg total) by mouth daily. 04/29/15   Jerrye Noble, MD  Blood Glucose Monitoring Suppl (ACCU-CHEK NANO SMARTVIEW) W/DEVICE KIT Use to check blood sugar up to 3 times a day DX code 250.02 insulin requiring 05/11/14   Wilber Oliphant, MD  carbamide peroxide (DEBROX) 6.5 % otic solution Place 5 drops into both ears 2 (two) times daily. 10/02/14   Wilber Oliphant, MD  cyclobenzaprine (FLEXERIL) 5 MG tablet Take 1 tablet (5 mg total) by mouth every 8 (eight) hours as needed for muscle spasms. 04/29/15 04/28/16  Jerrye Noble, MD  hydrochlorothiazide (HYDRODIURIL) 25 MG tablet  Take 1 tablet (25 mg total) by mouth daily. 04/29/15   Jerrye Noble, MD  insulin glargine (LANTUS) 100 UNIT/ML injection INJECT 58 UNITS INTO THE SKIN EVERY MORNING WHEN YOU WAKE UP 06/27/15   Liberty Handy, MD  losartan (COZAAR) 100 MG tablet Take 1 tablet (100 mg total) by mouth daily. 04/29/15 04/27/16  Jerrye Noble, MD  metFORMIN (GLUCOPHAGE) 500 MG tablet Take 2 tablets (1,000 mg total) by mouth daily with breakfast. Take with breakfast 06/27/15 06/26/16  Liberty Handy, MD  omeprazole (PRILOSEC) 20 MG capsule Take 1 capsule (20 mg total) by mouth daily. 04/29/15   Jerrye Noble, MD  omeprazole (PRILOSEC) 20 MG capsule TAKE 1 CAPSULE (20 MG TOTAL) BY MOUTH DAILY. 07/29/15   Liberty Handy, MD   BP 159/86 mmHg  Pulse 76  Temp(Src) 98.2 F (36.8 C) (Oral)  Resp 17  Ht '4\' 10"'  (1.473 m)  Wt 182 lb (82.555 kg)  BMI 38.05 kg/m2  SpO2 100%  LMP 02/15/2009 Physical Exam  Constitutional: She is oriented to person, place, and time. She appears well-developed and well-nourished. No distress.  HENT:  Head: Normocephalic and atraumatic.  Nose: Nose normal.  Mouth/Throat: Oropharynx is clear and moist. No oropharyngeal exudate.  Eyes: Conjunctivae and EOM are normal. Pupils are equal, round, and reactive to light. Right eye exhibits no discharge. Left eye exhibits no discharge. No scleral icterus.  Neck: Normal range of motion. No JVD present. No tracheal deviation present. No thyromegaly present.  Cardiovascular: Normal rate, regular rhythm, normal heart sounds and intact distal pulses.  Exam reveals no gallop and no friction rub.   No murmur heard. Pulmonary/Chest: Effort normal and breath sounds normal. No respiratory distress. She has no wheezes. She has no rales. She exhibits no tenderness.  Abdominal: Soft. Bowel sounds are normal. She exhibits no distension and no mass. There is no tenderness. There is no rebound and no guarding.  Musculoskeletal: Normal range of motion. She exhibits no edema or tenderness.  Lymphadenopathy:    She has no cervical adenopathy.  Neurological: She is alert and oriented to person, place, and time. She has normal reflexes. No cranial nerve deficit. She exhibits normal muscle tone. Coordination normal.  Skin: Skin is warm and dry. No rash noted. She is not diaphoretic. No erythema. No pallor.  No visible rash to bilateral forearms  Psychiatric: She has a normal mood and affect. Her behavior is normal. Judgment and thought content normal.  Nursing note and vitals reviewed.   ED Course  Procedures (including critical care time) Labs Review Labs Reviewed - No data to display  Imaging Review No results found. I have personally reviewed  and evaluated these images and lab results as part of my medical decision-making.   EKG Interpretation None      MDM   Final diagnoses:  None    Pruritic rash on bilateral arms, no visible or palpable rash or palpules Will treat for allergic rx - pepcid, benadryl, prednisone  Patient was given return precautions for allergic reaction and will be discharged with Benadryl and prednisone taper    Delsa Grana, PA-C 08/05/15 [place], MD 08/07/15 [phone]

## 2015-08-02 NOTE — Discharge Instructions (Signed)

## 2015-08-05 ENCOUNTER — Encounter: Payer: Self-pay | Admitting: Internal Medicine

## 2015-08-05 ENCOUNTER — Telehealth: Payer: Self-pay | Admitting: Student-PharmD

## 2015-08-05 ENCOUNTER — Ambulatory Visit (INDEPENDENT_AMBULATORY_CARE_PROVIDER_SITE_OTHER): Payer: Medicaid Other | Admitting: Internal Medicine

## 2015-08-05 VITALS — BP 167/96 | HR 67 | Temp 97.9°F | Ht <= 58 in | Wt 183.0 lb

## 2015-08-05 DIAGNOSIS — I10 Essential (primary) hypertension: Secondary | ICD-10-CM

## 2015-08-05 DIAGNOSIS — R21 Rash and other nonspecific skin eruption: Secondary | ICD-10-CM

## 2015-08-05 DIAGNOSIS — Z0000 Encounter for general adult medical examination without abnormal findings: Secondary | ICD-10-CM

## 2015-08-05 NOTE — Telephone Encounter (Signed)
Patient was contacted  with Viann Fish, PharmD candidate. I agree with the assessment and plan of care documented.

## 2015-08-05 NOTE — Progress Notes (Signed)
Patient ID: Nancy [name], female   DOB: Apr 19, 1956, 59 y.o.   MRN: [id number]    Subjective:   Patient ID: Nancy [name] female   DOB: 02/02/1956 59 y.o.   MRN: [id number]  HPI: Nancy [name] is a 59 y.o.  very pleasant woman with past medical history of hypertension, hyperlipidemia, insulin-dependent Type 2 DM, history of H pylori infection, bilateral carpal tunnel syndrome, chronic low back pain, and GERD who presents for ED follow-up of body rash.   She reports developing a whole body rash about 1 week after having her her done at the salon where she had moisturizer placed on her hair. The rash began on her scalp and then spread to her face, neck, chest, abdomen, back, and extremities. It was raised and looked like bumps with itching. She also reports eating cantaloupe recently but has had it in the past with no allergic reaction. She denies prior history of similar rash. She denies new soap, clothes, laundry detergent, insect bites, or new medications. She was wearing perfume but not new. She was seen in the ED on 8/26 and given prednisone dose pack and oral benadryl to take as needed which she reports compliance with. She reports improvement of rash and pruritis. She denies facial swelling, dyspnea, wheezing, chest pain, abdominal pain, diarrhea, or syncope. Her blood sugars have been elevated with most recent CBG of 374 this morning.    She is compliant with taking losartan and HCTZ for hypertension. She has occasional headache but denies chest pain, lightheadedness, or LE edema.   She would like to have flu shot at next visit.    Past Medical History  Diagnosis Date  . Diabetic peripheral neuropathy   . Carpal tunnel syndrome, bilateral     confirmed on nerve conduction studies  . Hyperlipidemia   . Diabetes mellitus type 2, uncontrolled   . GERD (gastroesophageal reflux disease)   . Heat edema   . History of Helicobacter infection 11/07  . History of chest pain    negative cardiolite September 2007.  ETT-myoview 12/10 with EF 76%, no ischemia,   . History of endometriosis   . Carpal tunnel syndrome    Current Outpatient Prescriptions  Medication Sig Dispense Refill  . ACCU-CHEK FASTCLIX LANCETS MISC Use to check blood sugar up to 3 times a day DX code 250.02 insulin requiring 102 each 5  . ACCU-CHEK SMARTVIEW test strip USE TO CHECK BLOOD SUGAR UP TO 3 TIMES A DAY 100 each 4  . aspirin EC 81 MG tablet Take 1 tablet (81 mg total) by mouth daily.    . Blood Glucose Monitoring Suppl (ACCU-CHEK NANO SMARTVIEW) W/DEVICE KIT Use to check blood sugar up to 3 times a day DX code 250.02 insulin requiring 1 kit 0  . carbamide peroxide (DEBROX) 6.5 % otic solution Place 5 drops into both ears 2 (two) times daily. 15 mL 0  . cyclobenzaprine (FLEXERIL) 5 MG tablet Take 1 tablet (5 mg total) by mouth every 8 (eight) hours as needed for muscle spasms. 30 tablet 1  . diphenhydrAMINE (BENADRYL) 25 MG tablet Take 1 tablet (25 mg total) by mouth every 6 (six) hours. 20 tablet 0  . hydrochlorothiazide (HYDRODIURIL) 25 MG tablet Take 1 tablet (25 mg total) by mouth daily. 90 tablet 3  . insulin glargine (LANTUS) 100 UNIT/ML injection INJECT 58 UNITS INTO THE SKIN EVERY MORNING WHEN YOU WAKE UP 20 mL 5  . losartan (COZAAR) 100 MG tablet Take 1 tablet (100 mg total)  by mouth daily. 90 tablet 3  . metFORMIN (GLUCOPHAGE) 500 MG tablet Take 2 tablets (1,000 mg total) by mouth daily with breakfast. Take with breakfast 60 tablet 5  . omeprazole (PRILOSEC) 20 MG capsule Take 1 capsule (20 mg total) by mouth daily. 30 capsule 2  . omeprazole (PRILOSEC) 20 MG capsule TAKE 1 CAPSULE (20 MG TOTAL) BY MOUTH DAILY. 30 capsule 1  . predniSONE (DELTASONE) 20 MG tablet Take 2 tablets (40 mg total) by mouth daily. Take 40 mg by mouth daily for 3 days, then 9m by mouth daily for 3 days, then 146mdaily for 3 days 12 tablet 0   No current facility-administered medications for this visit.    Family History  Problem Relation Age of Onset  . Diabetes Mother   . Colon cancer Neg Hx   . Lung cancer Maternal Uncle   . Breast cancer Maternal Grandmother   . Liver cancer Maternal Grandfather   . Heart disease Brother    Social History   Social History  . Marital Status: Single    Spouse Name: N/A  . Number of Children: 0  . Years of Education: 11   Occupational History  . CNA    Social History Main Topics  . Smoking status: Never Smoker   . Smokeless tobacco: Never Used  . Alcohol Use: No  . Drug Use: No  . Sexual Activity: Not Asked   Other Topics Concern  . None   Social History Narrative   [facility] [employer] 09/26/2010   Review of Systems: Review of Systems  Constitutional: Negative for fever and chills.  Eyes: Positive for blurred vision (chronic).  Respiratory: Negative for cough and shortness of breath.   Cardiovascular: Negative for chest pain and leg swelling.  Gastrointestinal: Positive for heartburn (controlled on PPI therapy ). Negative for nausea, vomiting, abdominal pain, diarrhea and constipation.  Genitourinary: Negative for dysuria, urgency and frequency.  Musculoskeletal: Positive for myalgias and joint pain (OA).  Skin: Positive for itching (whole body improved) and rash (whole body improved).  Neurological: Positive for sensory change (chronic peripheral neuropathy ) and headaches. Negative for dizziness.  Endo/Heme/Allergies: Positive for polydipsia (chronic).       Polyphagia since on prednisone     Objective:  Physical Exam: Filed Vitals:   08/05/15 0929  BP: 167/96  Pulse: 67  Temp: 97.9 F (36.6 C)  TempSrc: Oral  Height: '4\' 10"'  (1.473 m)  Weight: 183 lb (83.008 kg)  SpO2: 100%    Physical Exam  Constitutional: She is oriented to person, place, and time. She appears well-developed and well-nourished. No distress.  HENT:  Head: Normocephalic and  atraumatic.  Right Ear: External ear normal.  Left Ear: External ear normal.  Nose: Nose normal.  Mouth/Throat: Oropharynx is clear and moist. No oropharyngeal exudate.  Eyes: Conjunctivae and EOM are normal. Pupils are equal, round, and reactive to light. Right eye exhibits no discharge. Left eye exhibits no discharge. No scleral icterus.  Neck: Normal range of motion. Neck supple.  Cardiovascular: Normal rate, regular rhythm and normal heart sounds.   Pulmonary/Chest: Effort normal and breath sounds normal. No respiratory distress. She has no wheezes. She has no rales.  Abdominal: Soft. Bowel sounds are normal. She exhibits no distension. There is no tenderness. There is no rebound and no guarding.  Musculoskeletal: Normal range of motion. She exhibits no edema or tenderness.  Neurological: She  is alert and oriented to person, place, and time.  Skin: Skin is warm and dry. No rash noted. She is not diaphoretic. No erythema. No pallor.  Psychiatric: She has a normal mood and affect. Her behavior is normal. Judgment and thought content normal.    Assessment & Plan:   Please see problem list for problem-based assessment and plan

## 2015-08-05 NOTE — Patient Instructions (Signed)
-  Keep taking the prednisone and benadryl (as needed) for the rash  -Try over the counter calamine (caladryl) lotion for the itching  -Please let your hair stylist know about your rash so they don't use the same chemicals again next time  -Please come back on on 9/8 to see Dr. Marijean Bravo  -Will give you a flu shot next time  -Very nice seeing you again!  Rash A rash is a change in the color or texture of your skin. There are many different types of rashes. You may have other problems that accompany your rash. CAUSES   Infections.  Allergic reactions. This can include allergies to pets or foods.  Certain medicines.  Exposure to certain chemicals, soaps, or cosmetics.  Heat.  Exposure to poisonous plants.  Tumors, both cancerous and noncancerous. SYMPTOMS   Redness.  Scaly skin.  Itchy skin.  Dry or cracked skin.  Bumps.  Blisters.  Pain. DIAGNOSIS  Your caregiver may do a physical exam to determine what type of rash you have. A skin sample (biopsy) may be taken and examined under a microscope. TREATMENT  Treatment depends on the type of rash you have. Your caregiver may prescribe certain medicines. For serious conditions, you may need to see a skin doctor (dermatologist). HOME CARE INSTRUCTIONS   Avoid the substance that caused your rash.  Do not scratch your rash. This can cause infection.  You may take cool baths to help stop itching.  Only take over-the-counter or prescription medicines as directed by your caregiver.  Keep all follow-up appointments as directed by your caregiver. SEEK IMMEDIATE MEDICAL CARE IF:  You have increasing pain, swelling, or redness.  You have a fever.  You have new or severe symptoms.  You have body aches, diarrhea, or vomiting.  Your rash is not better after 3 days. MAKE SURE YOU:  Understand these instructions.  Will watch your condition.  Will get help right away if you are not doing well or get worse. Document Released:  11/13/2002 Document Revised: 02/15/2012 Document Reviewed: 09/07/2011 [facility] Patient Information 2015 [place], [place]. This information is not intended to replace advice given to you by your health care provider. Make sure you discuss any questions you have with your health care provider.     General Instructions:   Please bring your medicines with you each time you come to clinic.  Medicines may include prescription medications, over-the-counter medications, herbal remedies, eye drops, vitamins, or other pills.   Progress Toward Treatment Goals:  Treatment Goal 02/26/2015  Hemoglobin A1C -  Blood pressure deteriorated    Self Care Goals & Plans:  Self Care Goal 08/05/2015  Manage my medications take my medicines as prescribed; bring my medications to every visit; refill my medications on time; follow the sick day instructions if I am sick  Monitor my health keep track of my blood glucose; check my feet daily  Eat healthy foods eat more vegetables; eat fruit for snacks and desserts; eat smaller portions; drink diet soda or water instead of juice or soda  Be physically active find an activity I enjoy    Home Blood Glucose Monitoring 02/26/2015  Check my blood sugar 3 times a day  When to check my blood sugar before meals     Care Management & Community Referrals:  Referral 05/09/2013  Referrals made for care management support none needed

## 2015-08-05 NOTE — Progress Notes (Signed)
Internal Medicine Clinic Attending  Case discussed with Dr. Rabbani at the time of the visit.  We reviewed the resident's history and exam and pertinent patient test results.  I agree with the assessment, diagnosis, and plan of care documented in the resident's note.  

## 2015-08-05 NOTE — Assessment & Plan Note (Signed)
-  Administer annual influenza vaccination to next visit -Consider screening HCV Ab (born b/w 1944-65) at next visit

## 2015-08-05 NOTE — Assessment & Plan Note (Addendum)
Assessment: Pt with moderately well-controlled hypertension compliant with two-class (ARB & diuretic) anti-hypertensive therapy who presents with blood pressure of 167/96 in setting of prednisone use.   Plan:  -BP 167/96 not at goal <140/90 in setting of prednisone use  -Continue losartan 100 mg daily and HCTZ 25 mg daily  -Consider adding diltiazem at next visit in setting of proteinuria (1.2 g) if continues to be uncontrolled -Last CMP normal on 03/06/15

## 2015-08-05 NOTE — Telephone Encounter (Signed)
Called patient because she was eligible for DM+Steroids project taking Predinsone taper when diagnosed with diabetes.  Patient reports SMBG 400 on Sat, 300 on Sun, and 300 on Mon. Patient taking prednisone differently that prescription directions: 3 tabs (20 mg) in divided doses for 3 days.  2 tabs in divided doses for 3 days and then 1 tab for 3 days.  Discussed with Dr. Naaman Plummer who saw her in clinic this morning and agreed with the recommendation to stop taking the prednisone today and continue monitoring glucose.  Continue to use diphenhydramine for itching.  May restart prednisone if rash returns.  Patient no longer eligible for DM+Steroid project, but will continue to follow.  Viann Fish, PharmD candidate

## 2015-08-05 NOTE — Assessment & Plan Note (Addendum)
Assessment: Pt with 3-week history of whole body rash including scalp most likely triggered by allergic reaction to hair chemical who presents with improved pruritic rash.   Plan:   -Continue prednisone dose pack  -Continue PO benadryl 25 mg Q 8 hr PRN pruritis  -Pt instructed to use OTC calamine lotion PRN pruritis  -Pt instructed to tell her hair stylist to not use same hair chemical in the future  -Pt instructed to return if symptoms do not improve

## 2015-08-15 ENCOUNTER — Ambulatory Visit (INDEPENDENT_AMBULATORY_CARE_PROVIDER_SITE_OTHER): Payer: Medicaid Other | Admitting: Internal Medicine

## 2015-08-15 ENCOUNTER — Encounter: Payer: Self-pay | Admitting: Internal Medicine

## 2015-08-15 ENCOUNTER — Ambulatory Visit (HOSPITAL_COMMUNITY)
Admission: RE | Admit: 2015-08-15 | Discharge: 2015-08-15 | Disposition: A | Discharge status: Home / Self Care | Payer: Medicaid Other | Source: Ambulatory Visit | Attending: Internal Medicine | Admitting: Internal Medicine

## 2015-08-15 VITALS — BP 131/80 | HR 80 | Temp 97.7°F | Wt 183.9 lb

## 2015-08-15 DIAGNOSIS — M25531 Pain in right wrist: Secondary | ICD-10-CM | POA: Insufficient documentation

## 2015-08-15 DIAGNOSIS — M25532 Pain in left wrist: Secondary | ICD-10-CM | POA: Insufficient documentation

## 2015-08-15 DIAGNOSIS — Z23 Encounter for immunization: Secondary | ICD-10-CM

## 2015-08-15 DIAGNOSIS — M069 Rheumatoid arthritis, unspecified: Secondary | ICD-10-CM | POA: Diagnosis not present

## 2015-08-15 DIAGNOSIS — M7989 Other specified soft tissue disorders: Secondary | ICD-10-CM | POA: Diagnosis not present

## 2015-08-15 DIAGNOSIS — R21 Rash and other nonspecific skin eruption: Secondary | ICD-10-CM

## 2015-08-15 DIAGNOSIS — M1990 Unspecified osteoarthritis, unspecified site: Secondary | ICD-10-CM | POA: Insufficient documentation

## 2015-08-15 DIAGNOSIS — Z794 Long term (current) use of insulin: Secondary | ICD-10-CM

## 2015-08-15 DIAGNOSIS — M79642 Pain in left hand: Secondary | ICD-10-CM | POA: Insufficient documentation

## 2015-08-15 DIAGNOSIS — E1149 Type 2 diabetes mellitus with other diabetic neurological complication: Secondary | ICD-10-CM

## 2015-08-15 DIAGNOSIS — E1140 Type 2 diabetes mellitus with diabetic neuropathy, unspecified: Secondary | ICD-10-CM | POA: Diagnosis not present

## 2015-08-15 DIAGNOSIS — Z0000 Encounter for general adult medical examination without abnormal findings: Secondary | ICD-10-CM

## 2015-08-15 DIAGNOSIS — B1920 Unspecified viral hepatitis C without hepatic coma: Secondary | ICD-10-CM

## 2015-08-15 DIAGNOSIS — M19041 Primary osteoarthritis, right hand: Secondary | ICD-10-CM | POA: Insufficient documentation

## 2015-08-15 NOTE — Patient Instructions (Addendum)
Nancy [name], it was a pleasure seeing you today. As we discussed, you likely have Rheumatoid Arthritis, however, we will hold off on treating you since your symptoms do not exactly fit that picture. We will set you up to see a joint doctor, a rheumatologist, you could better take care of you for this problem. We are getting labs today to better help the rheumatologist. If you do not hear from the rheumatologists, be sure to schedule an appointment. You will follow up with [place] in about 4 weeks.  Rheumatoid Arthritis Rheumatoid arthritis is a disease that causes pain, puffiness (swelling), and stiffness of the joints. It is a long-term (chronic) disease. It can affect the whole body, even the eyes and lungs.  HOME CARE  Stay active, but lessen activity when the disease gets worse.  Eat healthy foods.  Put heat on the affected joints when you wake up and before activity. Keep the heat on for as long as told by your doctor.  Put ice on the affected joints after activity or exercise.  Put ice in a plastic bag.  Place a towel between your skin and the bag.  Leave the ice on for 15-20 minutes, 03-04 times a day.  Take all medicine and other dietary pills (supplements) as told by your doctor.  Use a splint as told by your doctor. Splints help keep joints in a certain position to keep the joint working right.  Do not sleep with pillows under your knees.  Go to programs that can keep you updated on treatments and ways to deal with your disease. GET HELP RIGHT AWAY IF:  You have times where you pass out (faint).  You have times where you are really weak.  You suddenly have a hot, painful joint that feels worse than your normal joint ache.  You have chills.  You have a fever. Document Released: 02/15/2012 Document Revised: 04/09/2014 Document Reviewed: 02/15/2012 [facility] Patient Information 2015 [place], [place]. This information is not intended to replace advice given to you by your health  care provider. Make sure you discuss any questions you have with your health care provider.

## 2015-08-15 NOTE — Progress Notes (Signed)
   Subjective:    Patient ID: Nancy [name], female    DOB: 1956/07/16, 59 y.o.   MRN: [id number]  HPI  See problem-based assessment and plan for details.  Review of Systems  Constitutional: Negative for fever.  Eyes:       Dry eyes  Respiratory: Negative for cough, chest tightness, shortness of breath and wheezing.   Cardiovascular: Negative for chest pain, palpitations and leg swelling.  Gastrointestinal: Negative for nausea and abdominal pain.  Musculoskeletal: Positive for joint swelling, arthralgias, neck pain and neck stiffness.       Objective:   Physical Exam  Constitutional: She appears well-developed and well-nourished. No distress.  Eyes: Conjunctivae and EOM are normal. Pupils are equal, round, and reactive to light. Right eye exhibits no discharge.  Cardiovascular: Normal rate, regular rhythm and normal heart sounds.   No murmur heard. Pulmonary/Chest: Effort normal and breath sounds normal. No respiratory distress. She has no wheezes.  Abdominal: Soft. Bowel sounds are normal. There is no tenderness.  Musculoskeletal: She exhibits tenderness.       Right elbow: Tenderness found.       Left elbow: Tenderness found.       Right wrist: She exhibits decreased range of motion and tenderness.       Left wrist: She exhibits decreased range of motion, tenderness and effusion.       Right knee: Tenderness found.       Left knee: Tenderness found.  Limited range of motion in fingers and wrists. Pain on palpation of posterior forearms bilaterally.  Skin: Skin is warm and dry. No rash noted.  Diffuse palpable mass versus fluid collection at distal posterior left forearm just proximal to wrist.          Assessment & Plan:  See problem-based assessment and plan for details.

## 2015-08-15 NOTE — Assessment & Plan Note (Signed)
See clinic note from 8/29. Patient had some nausea from the prednisone and has stopped taking it. She also says it "drove up [her] sugars." While she said she has had some mild itching, all of the rashes have resolved.  - Advise to take benadryl if itching worsens.

## 2015-08-15 NOTE — Assessment & Plan Note (Addendum)
She said her blood sugars have been controlled for the past month, save for the days she was taking prednisone. He BGs have returned to normal range after stopping prednisone. She has been adherent with her medications - Will maintain regimen of Metformin 500 mg BID with Lantus 58u daily. - Will defer A1C this visit since she has a recent one and a reading today would be confounded by her previous prednisone therapy.

## 2015-08-15 NOTE — Assessment & Plan Note (Addendum)
See previous A&P under problem Carpal Tunnel for pertinent previous clinic note on this problem.  Nancy [name] is a 59 yo women with a past medical history of T2DM and HTN who presents with a >10 year history joint/musculoskeletal pain. She cannot think of good descriptors of the pain, only able to describe it as a "tenderness." The pain is bilateral and in her wrists, elbows, knees, and occasionally in her shoulders. The pain is constant and is not worse in the morning or evening. . Nothing consistently improves the pain, and nothing consistently makes it worse. She also complains of a tender diffuse lump on her left distal forearm that has been there "for a while." She also complains of some dry eyes. She denies any fever, chills, nausea, vomiting, abdominal pain. Her great aunt had severe RA and led to her death. She has a cousin with SLE  She has had carpal tunnel surgery under the impression that the pain was due to nerve compressionA 2014 nerve conduction study demonstrated decreases conduction velocity (moderate), this is worse then a 2012 when it was mild, and thought to be due to focal demyelination rather than axonal in origin. However, she denies any "burning" sensation or paraesthesias.  At her July 2016 clinic visit, we obtained laboratory studies to query Rheumatoid Arthritis. Her RF was elevated at 39; although, it's important to note that RFs were positive in 2008 and 2011 as well. Her ESR was borderline elevated at 28, higher than her previous readings of 8-14 from 2008-2011. Her CCP was negative, as it has been in 2011. Previous ANA from 2008 and 2011 were negative.   Assessment  While this isn't a "classic" picture of RA in that the symptoms don't improve over the course of the day and her right forearm collection vs mass does not resemble a rheumatoid nodule, her laboratory studies and clinical picture are highly suggestive of this diagnosis. She has bilateral, peripheral arthritic  symptoms in three or more joints, she's had a consistently positive RF, her ESR is borderline elevated, other etiologies appear unlikely, the duration has been well over six weeks. Her symptoms as this point appear mild to moderate.  Plan - Ambulatory referral to rheumatology. Will seek advice on diagnostic confirmation and possible management with disease modifying agents. - Given that this isn't a classic picture of RA, will hold off on methotrexate therapy for now and defer to rheumatology. - Advise to use NSAIDS for pain management - Follow-up in 4 weeks - CBC, CMP with LFTs, Quantiferon gold, chronic hepatitis panel

## 2015-08-16 LAB — CBC
Hematocrit: 38.7 % (ref 34.0–46.6)
Hemoglobin: 12.9 g/dL (ref 11.1–15.9)
MCH: 26.1 pg — AB (ref 26.6–33.0)
MCHC: 33.3 g/dL (ref 31.5–35.7)
MCV: 78 fL — AB (ref 79–97)
PLATELETS: 207 10*3/uL (ref 150–379)
RBC: 4.95 x10E6/uL (ref 3.77–5.28)
RDW: 14.8 % (ref 12.3–15.4)
WBC: 8.2 10*3/uL (ref 3.4–10.8)

## 2015-08-16 LAB — CMP14 + ANION GAP
ALK PHOS: 78 IU/L (ref 39–117)
ALT: 18 IU/L (ref 0–32)
AST: 21 IU/L (ref 0–40)
Albumin/Globulin Ratio: 1.3 (ref 1.1–2.5)
Albumin: 3.4 g/dL — ABNORMAL LOW (ref 3.5–5.5)
Anion Gap: 15 mmol/L (ref 10.0–18.0)
BILIRUBIN TOTAL: 0.3 mg/dL (ref 0.0–1.2)
BUN/Creatinine Ratio: 15 (ref 9–23)
BUN: 15 mg/dL (ref 6–24)
CHLORIDE: 99 mmol/L (ref 97–108)
CO2: 27 mmol/L (ref 18–29)
Calcium: 8.9 mg/dL (ref 8.7–10.2)
Creatinine, Ser: 1 mg/dL (ref 0.57–1.00)
GFR calc Af Amer: 72 mL/min/{1.73_m2} (ref >59)
GFR calc non Af Amer: 63 mL/min/{1.73_m2} (ref >59)
GLOBULIN, TOTAL: 2.7 g/dL (ref 1.5–4.5)
GLUCOSE: 176 mg/dL — AB (ref 65–99)
POTASSIUM: 3.6 mmol/L (ref 3.5–5.2)
SODIUM: 141 mmol/L (ref 134–144)
Total Protein: 6.1 g/dL (ref 6.0–8.5)

## 2015-08-16 LAB — HEPATITIS C ANTIBODY: Hep C Virus Ab: >11 s/co ratio — ABNORMAL HIGH (ref 0.0–0.9)

## 2015-08-16 LAB — HEPATITIS B SURFACE ANTIBODY,QUALITATIVE: HEP B SURFACE AB, QUAL: NONREACTIVE

## 2015-08-16 LAB — HEPATITIS B SURFACE ANTIGEN: HEP B S AG: NEGATIVE

## 2015-08-16 LAB — HEPATITIS B CORE ANTIBODY, TOTAL: Hep B Core Total Ab: NEGATIVE

## 2015-08-16 NOTE — Progress Notes (Signed)
Internal Medicine Clinic Attending  I saw and evaluated the patient.  I personally confirmed the key portions of the history and exam documented by Dr. Marijean Bravo and I reviewed pertinent patient test results.  The assessment, diagnosis, and plan were formulated together and I agree with the documentation in the resident's note.  Clinical course and serologies are consistent with probable rheumatoid arthritis. I anticipate she will do well on methotrexate, but I want her to have a consultation with a rheumatologist prior to initiating a DMARD. Left wrist with a tender subcutaneous collection that is either a lipoma or a very proximal joint effusion; not consistent with rheumatoid nodule. Xrays today of hands and wrists. If it is effusion, either rheumatology or our clinic can perform an arthrocentesis.

## 2015-08-17 LAB — HEPATITIS B E ANTIBODY: Hep B E Ab: NEGATIVE

## 2015-08-18 LAB — HEPATITIS B DNA, ULTRAQUANTITATIVE, PCR

## 2015-08-20 LAB — QUANTIFERON IN TUBE
QFT TB AG MINUS NIL VALUE: 0.07 IU/mL
QUANTIFERON MITOGEN VALUE: 7.39 IU/mL
QUANTIFERON NIL VALUE: 0.74 [IU]/mL
QUANTIFERON TB AG VALUE: 0.81 IU/mL
QUANTIFERON TB GOLD: NEGATIVE

## 2015-08-20 LAB — QUANTIFERON TB GOLD ASSAY (BLOOD)

## 2015-08-22 ENCOUNTER — Telehealth: Payer: Self-pay | Admitting: Internal Medicine

## 2015-08-22 ENCOUNTER — Encounter: Payer: Medicaid Other | Admitting: Internal Medicine

## 2015-08-22 LAB — HCV RNA QUANT
HCV log10: 6.653 log10 IU/mL
Hepatitis C Quantitation: 4500000 IU/mL

## 2015-08-22 LAB — SPECIMEN STATUS REPORT

## 2015-08-22 NOTE — Telephone Encounter (Signed)
I contacted Nancy [name] regarding her blood test results, which showed that she had a chronic Hepatitis C infection (HCV Ab +, HCV Quant +). I informed of the blood test results, and said that she has probably had it for a long time and did not even know it. I informed her that her liver function actually looked normal despite the infection (AST, ALT were normal on last week's CMP). I informed to not donate blood, share needles, or do any activity that would result in a transfer of blood from one individual to the next. Her only question was if she was in any immediate danger. I told her she was not, and we will discuss next steps at her next clinic visit in October.  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 This is likely best managed by a rheumatologist, but we will discuss options at her next clinic visit in October.

## 2015-09-12 ENCOUNTER — Ambulatory Visit (INDEPENDENT_AMBULATORY_CARE_PROVIDER_SITE_OTHER): Payer: Medicaid Other | Admitting: Internal Medicine

## 2015-09-12 ENCOUNTER — Encounter: Payer: Self-pay | Admitting: Internal Medicine

## 2015-09-12 VITALS — BP 137/82 | HR 80 | Temp 97.7°F | Ht <= 58 in | Wt 184.0 lb

## 2015-09-12 DIAGNOSIS — Z794 Long term (current) use of insulin: Secondary | ICD-10-CM

## 2015-09-12 DIAGNOSIS — B1710 Acute hepatitis C without hepatic coma: Secondary | ICD-10-CM | POA: Diagnosis present

## 2015-09-12 DIAGNOSIS — Z7984 Long term (current) use of oral hypoglycemic drugs: Secondary | ICD-10-CM | POA: Diagnosis not present

## 2015-09-12 DIAGNOSIS — E1165 Type 2 diabetes mellitus with hyperglycemia: Secondary | ICD-10-CM

## 2015-09-12 DIAGNOSIS — B182 Chronic viral hepatitis C: Secondary | ICD-10-CM | POA: Diagnosis present

## 2015-09-12 DIAGNOSIS — M1990 Unspecified osteoarthritis, unspecified site: Secondary | ICD-10-CM

## 2015-09-12 DIAGNOSIS — E1149 Type 2 diabetes mellitus with other diabetic neurological complication: Secondary | ICD-10-CM

## 2015-09-12 DIAGNOSIS — E1140 Type 2 diabetes mellitus with diabetic neuropathy, unspecified: Secondary | ICD-10-CM | POA: Diagnosis not present

## 2015-09-12 LAB — GLUCOSE, CAPILLARY: Glucose-Capillary: 210 mg/dL — ABNORMAL HIGH (ref 65–99)

## 2015-09-12 LAB — POCT GLYCOSYLATED HEMOGLOBIN (HGB A1C): Hemoglobin A1C: 8.5

## 2015-09-12 MED ORDER — IBUPROFEN 200 MG PO TABS
200.0000 mg | ORAL_TABLET | Freq: Four times a day (QID) | ORAL | Status: DC | PRN
Start: 2015-09-12 — End: 2016-04-23

## 2015-09-12 NOTE — Patient Instructions (Signed)
Nancy [name], it was a pleasure seeing you today. We have diagnosed you with a liver virus, but it is not causing any damage to your liver at the moment. You will need to see an Infectious Disease doctor for the management of this condition. I have provided you some materials to prevent the infection from spreading to others. As I mentioned, it may or may not be responsible for your arthritis symptoms, and we are getting some blood tests today to help [place] understand what is going on. For your arthritis, you may continue to take ibuprofen. You will also need to see a rheumatologist in the future.  Hepatitis C Hepatitis C is a viral infection of the liver. It can lead to scarring of the liver (cirrhosis), liver failure, or liver cancer. Hepatitis C may go undetected for months or years because people with the infection may not have symptoms, or they may have only mild symptoms. CAUSES  Hepatitis C is caused by the hepatitis C virus (HCV). The virus can be passed from one person to another through:  Blood.  Contaminated needles, such as those used for tattooing, body piercing, acupuncture, or injecting drugs.  Having unprotected sex with an infected person.  Childbirth.  Blood transfusions or organ transplants done in the [place] before 1992. RISK FACTORS Risk factors for hepatitis C include:  Having unprotected sex with an infected person.  Using illegal drugs. SIGNS AND SYMPTOMS  Symptoms of hepatitis C may include:  Fatigue.  Loss of appetite.  Nausea.  Vomiting.  Abdominal pain.  Dark yellow urine.  Yellowish skin and eyes (jaundice).  Itching of the skin.  Clay-colored bowel movements.  Joint pain. Symptoms are not always present.  DIAGNOSIS  Hepatitis C is diagnosed with blood tests. Other types of tests may also be done to check how your liver is functioning. TREATMENT  Your health care provider may perform noninvasive tests or a liver biopsy to help determine  the best course of treatment. Treatment for hepatitis C may include one or more medicines. Your health care provider may check you for a recurring infection or other liver conditions every 6-12 months after treatment. HOME CARE INSTRUCTIONS   Rest as needed.  Take all medicines as directed by your health care provider.  Do not take any medicine unless approved by your health care provider. This includes over-the-counter medicine and birth control pills.  Do not drink alcohol.  Do not have sex until approved by your health care provider.  Do not share toothbrushes, nail clippers, razors, or needles. PREVENTION There is no vaccine for hepatitis C. The only way to prevent the disease is to reduce the risk of exposure to the virus. This may be done by:  Practicing safe sex and using condoms.  Avoiding illegal drugs. SEEK MEDICAL CARE IF:  You have a fever.  You develop abdominal pain.  You develop dark urine.  You have clay-colored bowel movements.  You develop joint pains. SEEK IMMEDIATE MEDICAL CARE IF:  You have increasing fatigue or weakness.  You lose your appetite.  You feel nauseous or vomit.  You develop jaundice or your jaundice gets worse.  You bruise or bleed easily. MAKE SURE YOU:   Understand these instructions.  Will watch your condition.  Will get help right away if you are not doing well or get worse.   This information is not intended to replace advice given to you by your health care provider. Make sure you discuss any questions you  have with your health care provider.   Document Released: 11/20/2000 Document Revised: 12/14/2014 Document Reviewed: 03/07/2014 Elsevier Interactive Patient Education [employer].

## 2015-09-12 NOTE — Progress Notes (Signed)
Medicine attending: I personally interviewed and briefly examined this patient, and reviewed pertinent clinical laboratory  data  with resident physician Dr.Jeremy Marijean Bravo and we discussed a   management plan.

## 2015-09-12 NOTE — Assessment & Plan Note (Addendum)
Her HgB A1c is 8.5, and increase from 6.8 six months ago. She has been adherent with her medications, although she reports eating a lot of fruit. It's possible her chronic hepatitis C could be exacerbating her diabetes.  - Will maintain regimen of 58U daily, metformin 500 mg BID - Will review and discuss changes to therapy on f/u visit 1 month from now
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
## 2015-09-12 NOTE — Assessment & Plan Note (Addendum)
DDx includes RA, cryoglobulinemia, HCV associated arthritis. Unclear etiology at this point, although treatment of HCV may be informative - Cryglobulin - C3, C4 - Ibuprofen 200 mg q6h prn pain, informed her to stop if she started to develop stomach pain or dark stool

## 2015-09-12 NOTE — Progress Notes (Signed)
   Subjective:    Patient ID: Nancy [name], female    DOB: 11/17/1956, 59 y.o.   MRN: [id number]  HPI  [name] is here for follow-up on her recently positive Hepatitis C test and her arthritis. Her arthritis complaints have been stable, saying she has difficulty using her hands, gets "knots" in her muscles, and has trouble walking stairs or long distances. She had many questions about her new Hepatitis C diagnosis.   Review of Systems  Constitutional: Positive for fatigue. Negative for fever and activity change.  HENT: Negative for sore throat.   Respiratory: Negative for shortness of breath.   Cardiovascular: Negative for chest pain and leg swelling.  Gastrointestinal: Negative for nausea, diarrhea and blood in stool.  Musculoskeletal: Positive for joint swelling, arthralgias and neck stiffness.  Skin: Positive for rash.       Several palpable "bumps" on right forearm that have since disappeared.  Neurological: Negative for dizziness, syncope and numbness.  Psychiatric/Behavioral: Negative for dysphoric mood.       Objective:   Physical Exam  Constitutional: She appears well-developed and well-nourished. No distress.  Eyes: Conjunctivae and EOM are normal. Pupils are equal, round, and reactive to light. Right eye exhibits no discharge.  Cardiovascular: Normal rate, regular rhythm and normal heart sounds.  No murmur heard. Pulmonary/Chest: Effort normal and breath sounds normal. No respiratory distress. She has no wheezes.  Abdominal: Soft. Bowel sounds are normal. There is no tenderness.  Musculoskeletal: She exhibits tenderness.   Right elbow: Tenderness found.   Left elbow: Tenderness found.   Right wrist: She exhibits decreased range of motion and tenderness.   Left wrist: She exhibits decreased range of motion, tenderness and effusion.   Right knee: Tenderness found.   Left knee: Tenderness found.  Limited range of motion in fingers and  wrists. Pain on palpation of posterior forearms bilaterally.  Skin: Skin is warm and dry. No rash noted.  Diffuse palpable mass versus fluid collection at distal posterior left forearm just proximal to wrist. Diffuse palpable mass versus fluid collection on right ankle.     Assessment & Plan:   Please see problem-based assessment and plan for details.

## 2015-09-13 LAB — C3 AND C4
COMPLEMENT C3, SERUM: 169 mg/dL — AB (ref 82–167)
Complement C4, Serum: 42 mg/dL (ref 14–44)

## 2015-09-16 LAB — CRYOGLOBULIN

## 2015-09-20 LAB — HEPATITIS C GENOTYPE

## 2015-10-02 ENCOUNTER — Ambulatory Visit (INDEPENDENT_AMBULATORY_CARE_PROVIDER_SITE_OTHER): Payer: Medicaid Other | Admitting: Internal Medicine

## 2015-10-02 ENCOUNTER — Other Ambulatory Visit: Payer: Self-pay | Admitting: Internal Medicine

## 2015-10-02 ENCOUNTER — Encounter: Payer: Self-pay | Admitting: Internal Medicine

## 2015-10-02 VITALS — BP 147/95 | HR 74 | Temp 98.1°F | Ht <= 58 in | Wt 185.0 lb

## 2015-10-02 DIAGNOSIS — Z23 Encounter for immunization: Secondary | ICD-10-CM | POA: Diagnosis present

## 2015-10-02 DIAGNOSIS — B182 Chronic viral hepatitis C: Secondary | ICD-10-CM

## 2015-10-02 MED ORDER — ELBASVIR-GRAZOPREVIR 50-100 MG PO TABS: 1.0000 | ORAL_TABLET | Freq: Every day | ORAL | Status: DC

## 2015-10-02 NOTE — Addendum Note (Signed)
Addended by: Myrtis Hopping A on: 10/02/2015 12:09 PM   Modules accepted: Orders

## 2015-10-02 NOTE — Patient Instructions (Signed)
Date 10/02/2015  Dear Ms Nancy [name], As discussed in the [facility], your hepatitis C therapy will include the following medications:          Zepatier (elbasvir 50 mg/grazoprevir 100 mg) for 12 weeks               Please note that ALL MEDICATIONS WILL START ON THE SAME DATE for a total of 12 weeks. ---------------------------------------------------------------- Your HCV Treatment Start Date: TBA   Your HCV genotype:  1b    Liver Fibrosis: TBD    ---------------------------------------------------------------- YOUR PHARMACY CONTACT:   [place] [facility] [place] Phone: [phone] Hours: Monday to Friday 7:30 am to 6:00 pm   Please always contact your pharmacy at least 3-4 business days before you run out of medications to ensure your next month's medication is ready or 1 week prior to running out if you receive it by mail.  Remember, each prescription is for 28 days. ---------------------------------------------------------------- GENERAL NOTES REGARDING YOUR HEPATITIS C MEDICATION:  ZEPATIER is available as a beige-colored, oval-shaped, film-coated tablet debossed with "770" on one side and plain on the other. Each tablet contains 50 mg elbasvir and 100 mg grazoprevir.  Common side effects of ZEPATIER when used without ribavirin include: - feeling tired -trouble sleeping - headache -diarrhea - nausea  Common side effects of ZEPATIER when used with ribavirin include: - low red blood cell counts (anemia) - feeling irritable - headache - stomach pain - feeling tired - depression - shortness of breath - joint pain - rash or itching  Please note that this only lists the most common side effects and is NOT a comprehensive list of the potential side effects of these medications. For more information, please review the drug information sheets that come with your medication package from the pharmacy.   ---------------------------------------------------------------- GENERAL HELPFUL HINTS ON HCV THERAPY: 1. Stay well-hydrated. 2. Notify the [facility] of any changes in your other over-the-counter/herbal or prescription medications. 3. If you miss a dose of your medication, take the missed dose as soon as you remember. Return to your regular time/dose schedule the next day.  4.  Do not stop taking your medications without first talking with your healthcare provider. 5.  You may take Tylenol (acetaminophen), as long as the dose is less than 2000 mg (OR no more than 4 tablets of the Tylenol Extra Strengths 500mg  tablet) in 24 hours. 6.  You will see our pharmacist-specialist within the first 2 weeks of starting your medication. 7.  You will need to obtain routine labs around week 4 and12 weeks after starting and then 3 to 6 months after finishing Zepatier.   8.  If ribavirin is part of your regimen, you also will have a lab visit within 2 weeks.   Scharlene Gloss, [place] [facility] [place], [place]  [id number] [phone]

## 2015-10-02 NOTE — Progress Notes (Signed)
[place] for Infectious Disease   CC: consideration for treatment for chronic hepatitis C  HPI:  +Nancy [name] is a 59 y.o. female who presents for initial evaluation and management of chronic hepatitis C.  Patient tested positive this year during routine screening exam. Hepatitis C-associated risk factors present are: none. Patient denies history of blood transfusion, intranasal drug use, IV drug abuse, multiple sexual partners, renal dialysis, sexual contact with person with liver disease, tattoos. Patient has had other studies performed. Results: hepatitis C RNA by PCR, result: positive. Patient has not had prior treatment for Hepatitis C. Patient does not have a past history of liver disease. Patient does have a family history of liver disease. Patient does not  have associated signs or symptoms related to liver disease.  Labs reviewed and confirm chronic hepatitis C with a positive viral load.   Records reviewed from PCP and shows she was tested this year and no risk factors identified with no drug use.        Patient does not have documented immunity to Hepatitis A. Patient does not have documented immunity to Hepatitis B.    Review of Systems:  Constitutional: negative for fatigue and malaise Gastrointestinal: negative for dyspepsia All other systems reviewed and are negative      Past Medical History  Diagnosis Date  . Diabetic peripheral neuropathy ([place])   . Carpal tunnel syndrome, bilateral     confirmed on nerve conduction studies  . Hyperlipidemia   . Diabetes mellitus type 2, uncontrolled ([place])   . GERD (gastroesophageal reflux disease)   . Heat edema   . History of Helicobacter infection 11/07  . History of chest pain     negative cardiolite September 2007.  ETT-myoview 12/10 with EF 76%, no ischemia,   . History of endometriosis   . Carpal tunnel syndrome     Prior to Admission medications   Medication Sig Start Date End Date Taking? Authorizing  Provider  ACCU-CHEK FASTCLIX LANCETS MISC Use to check blood sugar up to 3 times a day DX code 250.02 insulin requiring 05/11/14  Yes Wilber Oliphant, MD  ACCU-CHEK SMARTVIEW test strip USE TO CHECK BLOOD SUGAR UP TO 3 TIMES A DAY 07/19/15  Yes Liberty Handy, MD  aspirin EC 81 MG tablet Take 1 tablet (81 mg total) by mouth daily. 04/29/15  Yes Jerrye Noble, MD  Blood Glucose Monitoring Suppl (ACCU-CHEK NANO SMARTVIEW) W/DEVICE KIT Use to check blood sugar up to 3 times a day DX code 250.02 insulin requiring 05/11/14  Yes Wilber Oliphant, MD  hydrochlorothiazide (HYDRODIURIL) 25 MG tablet Take 1 tablet (25 mg total) by mouth daily. 04/29/15  Yes Jerrye Noble, MD  ibuprofen (ADVIL) 200 MG tablet Take 1 tablet (200 mg total) by mouth every 6 (six) hours as needed. 09/12/15 09/11/16 Yes Liberty Handy, MD  insulin glargine (LANTUS) 100 UNIT/ML injection INJECT 58 UNITS INTO THE SKIN EVERY MORNING WHEN YOU WAKE UP 06/27/15  Yes Liberty Handy, MD  losartan (COZAAR) 100 MG tablet Take 1 tablet (100 mg total) by mouth daily. 04/29/15 04/27/16 Yes Jerrye Noble, MD  metFORMIN (GLUCOPHAGE) 500 MG tablet Take 2 tablets (1,000 mg total) by mouth daily with breakfast. Take with breakfast 06/27/15 06/26/16 Yes Liberty Handy, MD  omeprazole (PRILOSEC) 20 MG capsule TAKE 1 CAPSULE (20 MG TOTAL) BY MOUTH DAILY. 07/29/15  Yes Liberty Handy, MD  carbamide peroxide (DEBROX) 6.5 % otic solution Place 5 drops into both ears 2 (  two) times daily. Patient not taking: Reported on 08/15/2015 10/02/14   Wilber Oliphant, MD  Elbasvir-Grazoprevir (ZEPATIER) 50-100 MG TABS Take 1 tablet by mouth daily. 10/02/15   Thayer Headings, MD    Allergies  Allergen Reactions  . Ace Inhibitors Cough    Persistent dry cough  . Amitriptyline Hcl Nausea Only    REACTION: Upset stomach  . Prednisone Nausea And Vomiting    Social History  Substance Use Topics  . Smoking status: Never Smoker   . Smokeless tobacco: Never Used  . Alcohol Use: No    Family  History  Problem Relation Age of Onset  . Diabetes Mother   . Colon cancer Neg Hx   . Lung cancer Maternal Uncle   . Breast cancer Maternal Grandmother   . Liver cancer Maternal Grandfather   . Heart disease Brother   maternal grandfather died of liver cancer   Objective:  Constitutional: in no apparent distress and alert,  Filed Vitals:   10/02/15 0919  BP: 147/95  Pulse: 74  Temp: 98.1 F (36.7 C)   Eyes: anicteric Cardiovascular: Cor RRR and No murmurs Respiratory: clear Gastrointestinal: Bowel sounds are normal, liver is not enlarged, spleen is not enlarged Musculoskeletal: peripheral pulses normal, no pedal edema, no clubbing or cyanosis Skin: negative for - jaundice, spider hemangioma, telangiectasia, palmar erythema, ecchymosis and atrophy; no porphyria cutanea tarda Lymphatic: no cervical lymphadenopathy   Laboratory Genotype: No results found for: HCVGENOTYPE HCV viral load: No results found for: HCVQUANT Lab Results  Component Value Date   WBC 8.2 08/15/2015   HGB 13.4 03/06/2015   HCT 38.7 08/15/2015   MCV 76.0* 03/06/2015   PLT 282 03/06/2015    Lab Results  Component Value Date   CREATININE 1.00 08/15/2015   BUN 15 08/15/2015   NA 141 08/15/2015   K 3.6 08/15/2015   CL 99 08/15/2015   CO2 27 08/15/2015    Lab Results  Component Value Date   ALT 18 08/15/2015   AST 21 08/15/2015   ALKPHOS 78 08/15/2015     Labs and history reviewed and show CHILD-PUGH A  5-6 points: Child class A 7-9 points: Child class B 10-15 points: Child class C  Lab Results  Component Value Date   BILITOT 0.3 08/15/2015   ALBUMIN 3.4* 08/15/2015     Assessment: New Patient with Chronic Hepatitis C genotype 1b, untreated.  I discussed with the patient the lab findings that confirm chronic hepatitis C as well as the natural history and progression of disease including about 30% of people who develop cirrhosis of the liver if left untreated and once cirrhosis is  established there is a 2-7% risk per year of liver cancer and liver failure.  I discussed the importance of treatment and benefits in reducing the risk, even if significant liver fibrosis exists.   Plan: 1) Patient counseled extensively on limiting acetaminophen to no more than 2 grams daily, avoidance of alcohol. 2) Transmission discussed with patient including sexual transmission, sharing razors and toothbrush.   3) Will need referral to gastroenterology if concern for cirrhosis 4) Will need referral for substance abuse counseling: No.; Further work up to include urine drug screen  No. 5) Will prescribe Zepatier for 12 weeks or 16 weeks with ribavirin if any NS5A resistance found 6) Hepatitis A vaccine Yes.   7) Hepatitis B vaccine Yes.   8) Pneumovax vaccine if concern for cirrhosis 9) Further work up to include liver staging with elastography  10) NS5A test  No. 10) will follow up after starting medication.

## 2015-10-07 ENCOUNTER — Other Ambulatory Visit: Payer: Self-pay | Admitting: Internal Medicine

## 2015-10-07 DIAGNOSIS — K219 Gastro-esophageal reflux disease without esophagitis: Secondary | ICD-10-CM

## 2015-10-10 ENCOUNTER — Telehealth: Payer: Self-pay | Admitting: *Deleted

## 2015-10-10 NOTE — Telephone Encounter (Signed)
Patient notified of appt for ultrasound at [facility] on 11/07/15 at 8:45 AM. Nothing to eat or drink after midnight. Myrtis Hopping

## 2015-10-16 ENCOUNTER — Other Ambulatory Visit: Payer: Self-pay | Admitting: Internal Medicine

## 2015-10-16 NOTE — Telephone Encounter (Signed)
Called Medicaid to follow up on PA request-request was approved, pharmacy aware and stated she will contact patient.Despina Hidden Cassady11/9/20163:21 PM

## 2015-10-16 NOTE — Telephone Encounter (Signed)
Pt called for refill, but rx actually needed a prior authorization from medicaid.  PA request submitted online via [place] [employer]- pt has an intolerance to ACE (cough). Request sent for review.  Will contact pt once a decision is made.Nancy [name], [name]11/9/20162:00 PM

## 2015-10-16 NOTE — Telephone Encounter (Signed)
Pt requesting losartan to be filled. Please call pt back.

## 2015-10-28 ENCOUNTER — Telehealth: Payer: Self-pay | Admitting: Internal Medicine

## 2015-10-28 ENCOUNTER — Other Ambulatory Visit: Payer: Self-pay | Admitting: Internal Medicine

## 2015-10-28 NOTE — Telephone Encounter (Signed)
Pt called regarding order for Zepatier that was done on 10/26 by Dr. Linus Salmons.  [facility] for patient to see if they rec'd rx.  Per pharmacy, they are trying to get medication approved by insurance.  Dr. Henreitta Leber office is working with them on this.  Informed pt that they will call her when medication has been approved.  She can call Dr. Henreitta Leber office to ensure nothing she should do to help processing.

## 2015-10-28 NOTE — Telephone Encounter (Signed)
Pt called requesting the nurse to call back regarding med.

## 2015-11-04 ENCOUNTER — Other Ambulatory Visit: Payer: Self-pay | Admitting: Internal Medicine

## 2015-11-07 ENCOUNTER — Ambulatory Visit (HOSPITAL_COMMUNITY)
Admission: RE | Admit: 2015-11-07 | Discharge: 2015-11-07 | Disposition: A | Discharge status: Home / Self Care | Payer: Medicaid Other | Source: Ambulatory Visit | Attending: Internal Medicine | Admitting: Internal Medicine

## 2015-11-07 DIAGNOSIS — B182 Chronic viral hepatitis C: Secondary | ICD-10-CM | POA: Insufficient documentation

## 2015-11-11 ENCOUNTER — Encounter: Payer: Self-pay | Admitting: Pharmacy Technician

## 2015-11-14 ENCOUNTER — Encounter: Payer: Medicaid Other | Admitting: Internal Medicine

## 2015-11-21 ENCOUNTER — Ambulatory Visit (INDEPENDENT_AMBULATORY_CARE_PROVIDER_SITE_OTHER): Payer: Medicaid Other | Admitting: Internal Medicine

## 2015-11-21 ENCOUNTER — Encounter: Payer: Self-pay | Admitting: Internal Medicine

## 2015-11-21 VITALS — BP 145/85 | HR 83 | Temp 97.5°F | Ht <= 58 in | Wt 188.5 lb

## 2015-11-21 DIAGNOSIS — Z760 Encounter for issue of repeat prescription: Secondary | ICD-10-CM

## 2015-11-21 DIAGNOSIS — I10 Essential (primary) hypertension: Secondary | ICD-10-CM | POA: Diagnosis present

## 2015-11-21 DIAGNOSIS — K219 Gastro-esophageal reflux disease without esophagitis: Secondary | ICD-10-CM

## 2015-11-21 DIAGNOSIS — M01X0 Direct infection of unspecified joint in infectious and parasitic diseases classified elsewhere: Secondary | ICD-10-CM | POA: Diagnosis not present

## 2015-11-21 DIAGNOSIS — Z794 Long term (current) use of insulin: Secondary | ICD-10-CM

## 2015-11-21 DIAGNOSIS — E1140 Type 2 diabetes mellitus with diabetic neuropathy, unspecified: Secondary | ICD-10-CM

## 2015-11-21 DIAGNOSIS — Z7984 Long term (current) use of oral hypoglycemic drugs: Secondary | ICD-10-CM | POA: Diagnosis not present

## 2015-11-21 DIAGNOSIS — B182 Chronic viral hepatitis C: Secondary | ICD-10-CM

## 2015-11-21 DIAGNOSIS — E1149 Type 2 diabetes mellitus with other diabetic neurological complication: Secondary | ICD-10-CM

## 2015-11-21 DIAGNOSIS — B199 Unspecified viral hepatitis without hepatic coma: Secondary | ICD-10-CM | POA: Insufficient documentation

## 2015-11-21 LAB — GLUCOSE, CAPILLARY: GLUCOSE-CAPILLARY: 158 mg/dL — AB (ref 65–99)

## 2015-11-21 MED ORDER — MELOXICAM 7.5 MG PO TABS: 7.5000 mg | ORAL_TABLET | Freq: Every day | ORAL | Status: DC | PRN

## 2015-11-21 MED ORDER — INSULIN GLARGINE 100 UNIT/ML ~~LOC~~ SOLN: SUBCUTANEOUS | Status: DC

## 2015-11-21 MED ORDER — GLUCOSE BLOOD VI STRP: ORAL_STRIP | Status: DC

## 2015-11-21 MED ORDER — OMEPRAZOLE 20 MG PO CPDR: DELAYED_RELEASE_CAPSULE | ORAL | Status: DC

## 2015-11-21 MED ORDER — METFORMIN HCL 500 MG PO TABS: 1000.0000 mg | ORAL_TABLET | Freq: Every day | ORAL | Status: DC

## 2015-11-21 MED ORDER — LOSARTAN POTASSIUM 100 MG PO TABS: 100.0000 mg | ORAL_TABLET | Freq: Every day | ORAL | Status: DC

## 2015-11-21 MED ORDER — HYDROCHLOROTHIAZIDE 25 MG PO TABS: 25.0000 mg | ORAL_TABLET | Freq: Every day | ORAL | Status: DC

## 2015-11-21 NOTE — Patient Instructions (Signed)
Ms. Stene, it was a pleasure seeing you today. I'm starting you on a new pain medication called meloxicam (Mobic). Do not take advil (ibuprofen) on the same days as the mobic. If you think Mobic isn't working for you, you can stop and just take advil.  Your blood sugars seem more controlled than in the past, especially with your reduced appetite from the medication.  Please continue to take Zepatier. I am hopeful that it will help with your arthritis.  Merry Christmas.

## 2015-11-22 NOTE — Assessment & Plan Note (Signed)
Nancy [name] expressed her concern for the risk of fractures on her acid reflux medication. She is 40 and does not meet criteria for a DEXA scan at this time. She has taken the medication for over a year, no more than once per day, and has had recurrent symptoms when she comes off of the medication. She also continues to use NSAIDs for her HCV arthritis. I explained that I would like her to continue her PPI, at least while she is taking NSAIDs. - Refill omeprazole 20 mg daily

## 2015-11-22 NOTE — Assessment & Plan Note (Signed)
Nancy [name] continues to have persistent pain despite ibuprofen. We will switch to a new NSAID to address her symptoms. I instructed her not to take meloxicam and ibuprofen on the same day. Reviewing the literature, HCV-associated arthritis has improved on previous antiviral therapies with interferon, but there is limited data on new direct antivirals - Meloxicam 7.5 mg daily prn

## 2015-11-22 NOTE — Assessment & Plan Note (Signed)
She is being followed by Dr. Henreitta Leber clinic and on a 12-week course of Zepatier. The medication is generally well tolerated by her, only a headache and diminished appetite. Her hepatic elastography shows a degree of fibrosis (stage F2, some F3), but her LFTs have been consistently normal. When asked, it appears as though she has not run into any financing issues. I spoke with Dr. Linus Salmons about reimbursement, and he reports it should not be a problem. - Continue 12-week course of Zepatier

## 2015-11-22 NOTE — Progress Notes (Signed)
   Subjective:    Patient ID: Nancy [name], female    DOB: [birth year], 59 y.o.   MRN: [id number]  HPI  Nancy [name] is a pleasant 59 year old woman with a past medical history of HCV (on Zepatier), HCV-associated arthritis, HTN, and T2DM who comes to the clinic today to discuss her arthritis, acid reflux medications, and her diabetes.  She is on Day 10 of a 12-week course of Zepatier under the direction of Dr. Linus Salmons from the ID clinic. 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 She continues to endorse pain in her wrist, MCP, and elbow joints bilaterally without a temporal pattern that significantly limits her quality of life and ability to work. She is hopeful that treating the HCV will also improve her joint pain. Her only side effects from the Zepatier are a mild headache and decreased appetite. She takes ibuprofen four times a week when it is unmanageable, taking up to four 200 mg pills at one time and not exceeding 800 mg a day. However, she says that the ibuprofen is largely ineffective. Review of [place] with hepatic elastography revealed fibrosis score of F2, and some F3.   With respect to her diabetes, she reports her blood sugars have improved with her diminished appetite. She reports AM BGs as low as the 90s, but as high as the 140s. Post-prandial blood sugars have been in the 58s. She denies any symptoms of hypoglycemia.   She also had questions about her prilosec 20 mg daily, hearing that there may be in an increase risk of fractures. She has not had a fracture since she was a "little girl." She notices that her acid reflux symptoms return if she stops taking it.   Review of Systems  Constitutional: Positive for appetite change. Negative for fever and fatigue.  HENT: Negative for congestion, rhinorrhea and sore throat.   Eyes: Negative for visual disturbance.  Respiratory: Negative for  cough and shortness of breath.   Cardiovascular: Negative for chest pain and leg swelling.  Gastrointestinal: Negative for nausea, abdominal pain, diarrhea and constipation.  Endocrine: Negative for polydipsia and polyuria.  Genitourinary: Negative for dysuria.  Musculoskeletal: Positive for joint swelling and arthralgias.  Neurological: Negative for dizziness, weakness and light-headedness.  Psychiatric/Behavioral: Negative for dysphoric mood.       Objective:   Physical Exam  Constitutional: She appears well-developed and well-nourished. No distress.  Eyes: Conjunctivae and EOM are normal. Pupils are equal, round, and reactive to light. Right eye exhibits no discharge. Sclerae anicteric Cardiovascular: Normal rate, regular rhythm and normal heart sounds.  No murmur heard. Pulmonary/Chest: Effort normal and breath sounds normal. No respiratory distress. She has no wheezes.  Abdominal: Soft. Bowel sounds are normal. There is no tenderness.  Musculoskeletal: She exhibits tenderness.   Right elbow: Tenderness found.   Left elbow: Tenderness found.   Right wrist: She exhibits decreased range of motion and tenderness.   Left wrist: She exhibits decreased range of motion, tenderness and effusion.   Right knee: Tenderness found.   Left knee: Tenderness found.  Limited range of motion in fingers and wrists. Pain on palpation of posterior forearms bilaterally.  Skin: Skin is warm and dry. No rash noted.       Assessment & Plan:  Please see problem-based assessment and plan for details.

## 2015-11-22 NOTE — Assessment & Plan Note (Signed)
Assessment: Moderately well-controlled HTN nearly at go and adherent with losartan 100 mg daily and HCTZ 25 mg daily. Patient has had a reduced appetite on Zepatier and may be at goal by next visit in two months with weight loss.   Plan: - Nearly at goal, continue current medications in setting of impending weight loss - Last BMP 08/2015 normal

## 2015-11-22 NOTE — Assessment & Plan Note (Signed)
[name] has had improved BGs with her reduced appetite on medication. Moreover, it is possible her HCV infection is worsening her insulin resistance. I will continue her current regimen while her HCV is actively being treated. - Continue lantus 58U daily, metformin 500 mg BID - A1c next visit in 2 months

## 2015-11-26 ENCOUNTER — Ambulatory Visit: Payer: Medicaid Other | Admitting: Pharmacist

## 2015-11-26 DIAGNOSIS — B182 Chronic viral hepatitis C: Secondary | ICD-10-CM

## 2015-11-26 MED ORDER — ONDANSETRON 4 MG PO TBDP: 4.0000 mg | ORAL_TABLET | Freq: Three times a day (TID) | ORAL | Status: DC | PRN

## 2015-11-26 NOTE — Progress Notes (Signed)
I saw and evaluated the patient. I personally confirmed the key portions of Dr. Barbera Setters history and exam and reviewed pertinent patient test results. The assessment, diagnosis, and plan were formulated together and I agree with the documentation in the resident's note.

## 2015-11-26 NOTE — Addendum Note (Signed)
Addended by: Oval Linsey D on: 11/26/2015 02:09 PM   Modules accepted: Level of Service

## 2015-11-26 NOTE — Progress Notes (Signed)
HPI: Nancy [name] is a 59 y.o. female who presents to the pharmacy clinic today for follow-up of her Hep C infection.  Lab Results  Component Value Date   HEPCGENOTYPE 1b 09/12/2015    Allergies: Allergies  Allergen Reactions  . Ace Inhibitors Cough    Persistent dry cough  . Amitriptyline Hcl Nausea Only    REACTION: Upset stomach  . Prednisone Nausea And Vomiting    Vitals:    Past Medical History: Past Medical History  Diagnosis Date  . Diabetic peripheral neuropathy ([place])   . Carpal tunnel syndrome, bilateral     confirmed on nerve conduction studies  . Hyperlipidemia   . Diabetes mellitus type 2, uncontrolled ([place])   . GERD (gastroesophageal reflux disease)   . Heat edema   . History of Helicobacter infection 11/07  . History of chest pain     negative cardiolite September 2007.  ETT-myoview 12/10 with EF 76%, no ischemia,   . History of endometriosis   . Carpal tunnel syndrome     Social History: Social History   Social History  . Marital Status: Single    Spouse Name: N/A  . Number of Children: 0  . Years of Education: 11   Occupational History  . CNA    Social History Main Topics  . Smoking status: Never Smoker   . Smokeless tobacco: Never Used  . Alcohol Use: No  . Drug Use: No  . Sexual Activity: Not on file   Other Topics Concern  . Not on file   Social History Narrative   Financial assistance approved for 100% discount at [facility] and has [facility] card per [employer]   09/26/2010    Labs: HEPATITIS B SURFACE AG (no units)  Date Value  08/15/2015 Negative    Lab Results  Component Value Date   HEPCGENOTYPE 1b 09/12/2015    No flowsheet data found.  AST  Date Value  08/15/2015 21 IU/L  03/06/2015 20 U/L  08/21/2013 22 U/L   ALT  Date Value  08/15/2015 18 IU/L  03/06/2015 14 U/L  08/21/2013 20 U/L    CrCl: CrCl cannot be calculated (Patient has no serum creatinine result on file.).  Fibrosis Score: F2/F3 as assessed  by elastography   Previous Treatment Regimen: None  Assessment: Ain is here today for her appointment in the pharmacy clinic for follow-up of her Hep C infection. She started Zepatier on 11/11/15. She is having debilitating headaches to the point where she cannot leave the bed. She is also having some nausea and vomiting. She did have meloxicam prescribed that she has not picked up yet.  I instructed her to pick up her meloxicam and also told her she could take generic excedrin as well for her headaches.  I prescribed some zofran ODT for her nausea and sent them to [facility] for her to pick up today.  Despite her bad headaches and nausea and vomiting, she has not missed any doses.  I congratulated her and emphasized that she needed to push through the next 2.5 months.  Also instructed her that the headaches and nausea should go away soon. She is very pleasant and agreed to the plan.  Recommendations: Zofran 4 mg ODT PO q8h PRN nausea/vomiting sent to [facility] Continue Zepatier for 3 months Lab appointment Thursday, December 12, 2015 at 11am Follow-up appointment with Dr. Linus Salmons on Monday, February 17, 2016 at 3:15pm  Cassie L. Nicole Kindred, PharmD PGY2 Infectious Diseases Pharmacy Resident Pager: [phone] 11/26/2015  10:59 AM

## 2015-12-12 ENCOUNTER — Other Ambulatory Visit: Payer: Medicaid Other

## 2015-12-12 DIAGNOSIS — B182 Chronic viral hepatitis C: Secondary | ICD-10-CM

## 2015-12-17 LAB — HEPATITIS C RNA QUANTITATIVE: HCV Quantitative: NOT DETECTED IU/mL (ref <15)

## 2016-01-02 MED FILL — ZEPATIER 50-100 MG TABLET: 50-100 | 28 days supply | Qty: 28 | Fill #2

## 2016-01-16 ENCOUNTER — Ambulatory Visit (INDEPENDENT_AMBULATORY_CARE_PROVIDER_SITE_OTHER): Payer: Medicaid Other | Admitting: Dietician

## 2016-01-16 ENCOUNTER — Ambulatory Visit (INDEPENDENT_AMBULATORY_CARE_PROVIDER_SITE_OTHER): Payer: Medicaid Other | Admitting: Internal Medicine

## 2016-01-16 ENCOUNTER — Encounter: Payer: Self-pay | Admitting: Internal Medicine

## 2016-01-16 ENCOUNTER — Telehealth: Payer: Self-pay | Admitting: Dietician

## 2016-01-16 VITALS — BP 137/71 | HR 73 | Temp 97.6°F | Ht <= 58 in | Wt 186.7 lb

## 2016-01-16 DIAGNOSIS — Z713 Dietary counseling and surveillance: Secondary | ICD-10-CM

## 2016-01-16 DIAGNOSIS — B199 Unspecified viral hepatitis without hepatic coma: Secondary | ICD-10-CM

## 2016-01-16 DIAGNOSIS — Z7984 Long term (current) use of oral hypoglycemic drugs: Secondary | ICD-10-CM | POA: Diagnosis not present

## 2016-01-16 DIAGNOSIS — E1140 Type 2 diabetes mellitus with diabetic neuropathy, unspecified: Secondary | ICD-10-CM | POA: Diagnosis not present

## 2016-01-16 DIAGNOSIS — M01X41 Direct infection of right hand in infectious and parasitic diseases classified elsewhere: Secondary | ICD-10-CM | POA: Diagnosis not present

## 2016-01-16 DIAGNOSIS — B182 Chronic viral hepatitis C: Secondary | ICD-10-CM

## 2016-01-16 DIAGNOSIS — Z794 Long term (current) use of insulin: Secondary | ICD-10-CM

## 2016-01-16 DIAGNOSIS — M01X0 Direct infection of unspecified joint in infectious and parasitic diseases classified elsewhere: Secondary | ICD-10-CM

## 2016-01-16 DIAGNOSIS — E119 Type 2 diabetes mellitus without complications: Secondary | ICD-10-CM

## 2016-01-16 DIAGNOSIS — E1149 Type 2 diabetes mellitus with other diabetic neurological complication: Secondary | ICD-10-CM

## 2016-01-16 LAB — GLUCOSE, CAPILLARY: GLUCOSE-CAPILLARY: 121 mg/dL — AB (ref 65–99)

## 2016-01-16 LAB — POCT GLYCOSYLATED HEMOGLOBIN (HGB A1C): Hemoglobin A1C: 7.7

## 2016-01-16 NOTE — Telephone Encounter (Signed)
Called patient for A1C>8% project. She is due to get new A1C done today.  She feels her blood sugars have been increased since she has been on medicine for her hepatitis. However, some days her blood sugar is 70s to 100s. This am her fasting was 160. She asks for help getting test strips. She says her insurance is not covering. CDE called her pharmacy, they were not running it through correctly. They are now covered.   Patient agreed to appointment with CDE this afternoon.Please enter a referral if you agree.

## 2016-01-16 NOTE — Progress Notes (Signed)
   Subjective:    Patient ID: Nancy [name], female    DOB: June 16, 1956, 60 y.o.   MRN: [id number]  HPI  Nancy [name] comes to the clinic to discuss her Hep C, Hepatitis C associated arthritis, and her T2DM  She continues to follow with Nancy [name] for management of her Hepatitis C.   Her HCV-arthritis is improving on Zepatier, especially in her hands. She reports being better able to work with hands on the therapy.  With respect to her diabetes, she unfortunately did not bring her meter. She has been adherent with her insulin regimen. She reports lowest BG in the 80s and highest BGs in 180s.   Review of Systems  Gastrointestinal: Negative for abdominal pain and constipation.  Musculoskeletal: Positive for arthralgias. Negative for back pain.       Persistent hand arthralgias but improved.       Objective:   Physical Exam  Constitutional: She appears well-developed and well-nourished. No distress.  Cardiovascular: Normal rate, regular rhythm and normal heart sounds.   Pulmonary/Chest: Effort normal and breath sounds normal. No respiratory distress. She has no wheezes.  Musculoskeletal:  Minimal TTP and normal ROM of hand joints.           Assessment & Plan:   Please see problem-based assessment and plan for details.

## 2016-01-16 NOTE — Progress Notes (Signed)
Diabetes distress screen was administered and showed a low diabetes distress.

## 2016-01-16 NOTE — Progress Notes (Signed)
Diabetes Self-Management Education  Visit Type: Follow-up  Appt. Start Time: 1300 Appt. End Time: 1330  01/16/2016  [name], identified by name and date of birth, is a 60 y.o. female with a diagnosis of Diabetes:  Marland Kitchen Type 2  ASSESSMENT  Last menstrual period 02/15/2009. There is no weight on file to calculate BMI.      Diabetes Self-Management Education - 01/16/16 1600    Visit Information   Visit Type Follow-up   Health Coping   How would you rate your overall health? Good   Psychosocial Assessment   Patient Belief/Attitude about Diabetes Motivated to manage diabetes   Self-care barriers None   Self-management support Doctor's office;Family;CDE visits   Other persons present Parent   Patient Concerns Support   Preferred Learning Style No preference indicated   Learning Readiness Ready   How often do you need to have someone help you when you read instructions, pamphlets, or other written materials from your doctor or pharmacy? 1 - Never   What is the last grade level you completed in school? 12   Complications   Last HgB A1C per patient/outside source 7.7 %   How often do you check your blood sugar? 1-2 times/day   Fasting Blood glucose range (mg/dL) 70-129;130-179   Number of hypoglycemic episodes per month --  no   Number of hyperglycemic episodes per week --  yes   Have you had a dilated eye exam in the past 12 months? Yes   Dietary Intake   Breakfast eats a late breakfast,   Snack (morning) fruit for snacks   Dinner eats an early dinner- fried chicken one time amonth, vegetables, home cooked mostly   Snack (evening) orange for bedtime snack   Beverage(s) mostly diet soda now=- sometimes regular   Exercise   Exercise Type ADL's;Light (walking / raking leaves)   How many days per week to you exercise? --  7   Patient Education   Previous Diabetes Education Yes here many times in past 10 years   Psychosocial adjustment Worked with patient to identify  barriers to care and solutions;Role of stress on diabetes;Identified and addressed patients feelings and concerns about diabetes   Outcomes   Expected Outcomes Demonstrated interest in learning. Expect positive outcomes   Future DMSE Yearly   Program Status Completed   Subsequent Visit   Since your last visit have you continued or begun to take your medications as prescribed? Yes   Since your last visit have you had your blood pressure checked? Yes   Is your most recent blood pressure lower, unchanged, or higher since your last visit? Lower   Since your last visit have you experienced any weight changes? No change   Since your last visit, are you checking your blood glucose at least once a day? Yes      Individualized Plan for Diabetes Self-Management Training:   Learning Objective:  Patient will have a greater understanding of diabetes self-management. Patient education plan is to follow up in 1 year- no needs identified today.  She lives with her mother who is a continuing support for her diabetes self care Plan:   There are no Patient Instructions on file for this visit.  Expected Outcomes:  Demonstrated interest in learning. Expect positive outcomes Education material provided: Support group flyer If problems or questions, patient to contact team via:  Phone Future DSME appointment: Yearly

## 2016-01-16 NOTE — Patient Instructions (Addendum)
Nancy [name],   It was nice meeting with you and your mother.  I am mailing the Type 2 diabetes support group flyer for your information.  It is always FREE! Take care and would like for [place] to meet again next year or sooner if needed.   Take care,  Debera Lat [phone]

## 2016-01-16 NOTE — Patient Instructions (Signed)
Nancy [name], it was a pleasure seeing you in the clinic today.  Your blood sugars are doing really well! It also doesn't sound like your blood sugars have gone too low on your insulin since last we spoke.  Your arthritis seems a little better on exam today. I'm hopeful that this will continue to improve.  Please follow-up with up Dr. Linus Salmons in March.  Happy Valentine's Day.

## 2016-01-18 NOTE — Assessment & Plan Note (Signed)
A: A1c improved from 8.5 in 09/2015 to 7.5. She reports eating a healthy diet. It is possible her HCV response to treatment is improving her insulin senstivity.   P: Lantus 58U daily, metformin 500 mg BID - Meeting with diabetes coordinator today.

## 2016-01-18 NOTE — Assessment & Plan Note (Signed)
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 She may treat with NSAIDs intermittently as this hopefully resolves  P: May do 7 day course of Meloxicam for pain mgmt

## 2016-01-18 NOTE — Assessment & Plan Note (Signed)
A: Continuing 12 week course of Zepatier. Repeat testing shows resolution of viremia.  P: Finish 12 week course of Zepatier

## 2016-01-20 NOTE — Progress Notes (Signed)
Internal Medicine Clinic Attending  Case discussed with Dr. Ford at the time of the visit.  We reviewed the resident's history and exam and pertinent patient test results.  I agree with the assessment, diagnosis, and plan of care documented in the resident's note.  

## 2016-02-12 ENCOUNTER — Other Ambulatory Visit: Payer: Self-pay

## 2016-02-12 DIAGNOSIS — Z1231 Encounter for screening mammogram for malignant neoplasm of breast: Secondary | ICD-10-CM

## 2016-02-17 ENCOUNTER — Ambulatory Visit (INDEPENDENT_AMBULATORY_CARE_PROVIDER_SITE_OTHER): Payer: Medicaid Other | Admitting: Internal Medicine

## 2016-02-17 ENCOUNTER — Encounter: Payer: Self-pay | Admitting: Internal Medicine

## 2016-02-17 VITALS — BP 136/87 | HR 85 | Temp 97.7°F | Ht <= 58 in | Wt 186.0 lb

## 2016-02-17 DIAGNOSIS — Z23 Encounter for immunization: Secondary | ICD-10-CM | POA: Diagnosis not present

## 2016-02-17 DIAGNOSIS — K7400 Hepatic fibrosis, unspecified: Secondary | ICD-10-CM

## 2016-02-17 DIAGNOSIS — Z0000 Encounter for general adult medical examination without abnormal findings: Secondary | ICD-10-CM | POA: Diagnosis not present

## 2016-02-17 DIAGNOSIS — B182 Chronic viral hepatitis C: Secondary | ICD-10-CM | POA: Diagnosis not present

## 2016-02-17 HISTORY — DX: Hepatic fibrosis, unspecified: K74.00

## 2016-02-17 NOTE — Assessment & Plan Note (Signed)
Hepatitis B vaccine #2 today.  #3 and hepatitis A next visit.

## 2016-02-17 NOTE — Progress Notes (Signed)
   Subjective:    Patient ID: Nancy [name], female    DOB: 04-May-1956, 60 y.o.   MRN: [id number]  HPI Here for follow up of hepatitis C.    Has genotype 1b, viral load of 4.5 million, started and now completed elbasvir/grazoprevir for 12 weeks.  Some headache but no big issues, no missed doses.  Did have an elastography and F2/3 noted.  Does not drink alcohol.  Undetectable 1 month after starting. Getting hepatitis B series.     Review of Systems  Gastrointestinal: Negative for nausea and diarrhea.  Skin: Negative for rash.  Neurological: Negative for light-headedness and headaches.       Objective:   Physical Exam  Constitutional: She appears well-developed and well-nourished. No distress.  Eyes: No scleral icterus.  Cardiovascular: Normal rate, regular rhythm and normal heart sounds.   No murmur heard. Pulmonary/Chest: Effort normal and breath sounds normal. No respiratory distress.  Skin: No rash noted.   Social History   Social History  . Marital Status: Single    Spouse Name: N/A  . Number of Children: 0  . Years of Education: 11   Occupational History  . CNA    Social History Main Topics  . Smoking status: Never Smoker   . Smokeless tobacco: Never Used  . Alcohol Use: No  . Drug Use: No  . Sexual Activity: Not on file   Other Topics Concern  . Not on file   Social History Narrative   Financial assistance approved for 100% discount at [facility] and has [facility] card per [employer]   09/26/2010         Assessment & Plan:

## 2016-02-17 NOTE — Assessment & Plan Note (Signed)
Doing great.  End of treatment lab today and then [id number] next visit prior to appt.

## 2016-02-17 NOTE — Addendum Note (Signed)
Addended by: Landis Gandy on: 02/17/2016 05:25 PM   Modules accepted: Orders

## 2016-02-17 NOTE — Assessment & Plan Note (Signed)
Will recheck in about 1 year.

## 2016-02-19 LAB — HEPATITIS C RNA QUANTITATIVE: HCV QUANT: NOT DETECTED [IU]/mL (ref <15)

## 2016-03-02 ENCOUNTER — Encounter: Payer: Self-pay | Admitting: *Deleted

## 2016-03-09 ENCOUNTER — Other Ambulatory Visit: Payer: Self-pay | Admitting: Internal Medicine

## 2016-03-09 DIAGNOSIS — I10 Essential (primary) hypertension: Secondary | ICD-10-CM

## 2016-03-19 ENCOUNTER — Ambulatory Visit: Payer: Medicaid Other | Admitting: Dietician

## 2016-03-19 ENCOUNTER — Encounter: Payer: Medicaid Other | Admitting: Internal Medicine

## 2016-03-23 ENCOUNTER — Ambulatory Visit
Admission: RE | Admit: 2016-03-23 | Discharge: 2016-03-23 | Disposition: A | Discharge status: Home / Self Care | Payer: Medicare Other | Source: Ambulatory Visit

## 2016-03-23 ENCOUNTER — Ambulatory Visit: Payer: Medicaid Other

## 2016-03-23 DIAGNOSIS — Z1231 Encounter for screening mammogram for malignant neoplasm of breast: Secondary | ICD-10-CM | POA: Diagnosis not present

## 2016-04-10 ENCOUNTER — Encounter: Payer: Medicaid Other | Admitting: Dietician

## 2016-04-10 ENCOUNTER — Ambulatory Visit: Payer: Medicaid Other | Admitting: Internal Medicine

## 2016-04-14 ENCOUNTER — Telehealth: Payer: Self-pay | Admitting: *Deleted

## 2016-04-14 NOTE — Telephone Encounter (Signed)
Pt calling to ask about losartan, script sent electronically in dec, called [facility] pharm, they have script, something is wrong with ht system, they will fill, pt informed

## 2016-04-23 ENCOUNTER — Ambulatory Visit (INDEPENDENT_AMBULATORY_CARE_PROVIDER_SITE_OTHER): Payer: Medicare Other | Admitting: Dietician

## 2016-04-23 ENCOUNTER — Telehealth: Payer: Self-pay | Admitting: *Deleted

## 2016-04-23 ENCOUNTER — Ambulatory Visit (INDEPENDENT_AMBULATORY_CARE_PROVIDER_SITE_OTHER): Payer: Medicare Other | Admitting: Internal Medicine

## 2016-04-23 VITALS — BP 138/83 | HR 64 | Temp 97.8°F | Ht <= 58 in | Wt 188.1 lb

## 2016-04-23 DIAGNOSIS — Z0000 Encounter for general adult medical examination without abnormal findings: Secondary | ICD-10-CM

## 2016-04-23 DIAGNOSIS — M01X39 Direct infection of unspecified wrist in infectious and parasitic diseases classified elsewhere: Secondary | ICD-10-CM

## 2016-04-23 DIAGNOSIS — B199 Unspecified viral hepatitis without hepatic coma: Secondary | ICD-10-CM

## 2016-04-23 DIAGNOSIS — Z7189 Other specified counseling: Secondary | ICD-10-CM

## 2016-04-23 DIAGNOSIS — B1920 Unspecified viral hepatitis C without hepatic coma: Secondary | ICD-10-CM

## 2016-04-23 DIAGNOSIS — R252 Cramp and spasm: Secondary | ICD-10-CM

## 2016-04-23 DIAGNOSIS — M01X19 Direct infection of unspecified shoulder in infectious and parasitic diseases classified elsewhere: Secondary | ICD-10-CM

## 2016-04-23 DIAGNOSIS — L304 Erythema intertrigo: Secondary | ICD-10-CM | POA: Diagnosis not present

## 2016-04-23 DIAGNOSIS — M62838 Other muscle spasm: Secondary | ICD-10-CM | POA: Insufficient documentation

## 2016-04-23 DIAGNOSIS — M01X0 Direct infection of unspecified joint in infectious and parasitic diseases classified elsewhere: Principal | ICD-10-CM

## 2016-04-23 MED ORDER — METHOCARBAMOL 500 MG PO TABS: 500.0000 mg | ORAL_TABLET | Freq: Three times a day (TID) | ORAL | Status: DC | PRN

## 2016-04-23 MED ORDER — DICLOFENAC SODIUM 1 % TD GEL: 4.0000 g | Freq: Four times a day (QID) | TRANSDERMAL | Status: DC

## 2016-04-23 MED ORDER — ZINC OXIDE 20 % EX OINT: 1.0000 "application " | TOPICAL_OINTMENT | Freq: Four times a day (QID) | CUTANEOUS | Status: DC

## 2016-04-23 MED ORDER — METHOCARBAMOL 500 MG PO TABS: 500.0000 mg | ORAL_TABLET | Freq: Four times a day (QID) | ORAL | Status: DC

## 2016-04-23 NOTE — Progress Notes (Signed)
The following was competed today and handed off the physician: 1. Mini-Cog 2. Updated care team 3. TUG test 4. Health risk Assessment 5. Advance Directive Discussion primer & permission to videotape

## 2016-04-23 NOTE — Telephone Encounter (Signed)
PA request submitted online via Cover My Meds-request approved, pharmacy aware.Despina Hidden Cassady5/18/20174:58 PM

## 2016-04-23 NOTE — Patient Instructions (Addendum)
Ms. Swathi, Socks to see you as always.  For your joint pains, I am prescribing voltaren gel. You just rub it deep into whatever joint is bothering you.  For your bumps under your breasts and behind your neck, please pick up Nystatin cream. Any anti-fungal cream will do.  For your muscle spasms, I am prescribing Robaxin. You can take this AS NEEDED every 8 hours.  We'll see you next week for follow up for your diabetes.

## 2016-04-23 NOTE — Progress Notes (Signed)
Ms.  Mccrum is here for a preventative care appt and understands that acute and chronic medical issues will be addressed at another appt. The following items have been assessed and recorded in the appropriate area in the EMR.   Height, weight, BMI, and BP Depression screen Fall risk / safety level:  Advance directive discussion Preventative care services discussion  Medical and family history were reviewed and updated Medication reconciliation Personalized health advice, risky behaviors, and treatment advice Cognitive screen  Please see the assessment and plan for further information.

## 2016-04-24 DIAGNOSIS — E119 Type 2 diabetes mellitus without complications: Secondary | ICD-10-CM | POA: Diagnosis not present

## 2016-04-24 DIAGNOSIS — H52223 Regular astigmatism, bilateral: Secondary | ICD-10-CM | POA: Diagnosis not present

## 2016-04-24 DIAGNOSIS — Z1211 Encounter for screening for malignant neoplasm of colon: Secondary | ICD-10-CM | POA: Insufficient documentation

## 2016-04-24 DIAGNOSIS — H524 Presbyopia: Secondary | ICD-10-CM | POA: Diagnosis not present

## 2016-04-24 DIAGNOSIS — Z7189 Other specified counseling: Secondary | ICD-10-CM | POA: Insufficient documentation

## 2016-04-24 DIAGNOSIS — H5203 Hypermetropia, bilateral: Secondary | ICD-10-CM | POA: Diagnosis not present

## 2016-04-24 LAB — HM DIABETES EYE EXAM

## 2016-04-24 NOTE — Assessment & Plan Note (Signed)
S: Patient is complaining of wrist and shoulder arthritis pain today. Meloxicam was somewhat helpful.   A: Residual HCV arthritis  P: Voltaren gel

## 2016-04-24 NOTE — Assessment & Plan Note (Signed)
S: Complaining of itching in fold of neck and under breast.  PE: My exam reveals redness in creases of neck and breast  A: Intertrigo  P: Nystatin cream

## 2016-04-24 NOTE — Assessment & Plan Note (Signed)
S: Patient wishes to remain full code at this time. Would like to simultaneously focus on both quality and quantity of life. Dementia screen performed twice, both Mini-COG and MOCA. Some visuospatial impairment, verbal fluency problems, and dyscalculia, but mother affirms that she has not noticed any changes in the patient's cognition or functioning.   MOCA: 24 (1 pt for not finishing high school)  A: Patient's goals are consistent with maximum medical management, her desire to recently treat HCV is evidence of this.  P: Repeat cognitive assessment and GOC discussion in 1 year

## 2016-04-24 NOTE — Assessment & Plan Note (Signed)
S: Patient complains of muscle spasms that occur in the morning and night, particularly in her back and abdomen.  A: Muscle spasms  P: Methocarbamol q8h prn

## 2016-04-27 NOTE — Progress Notes (Signed)
Internal Medicine Clinic Attending  Case discussed with Dr. Ford at the time of the visit.  We reviewed the resident's history and exam and pertinent patient test results.  I agree with the assessment, diagnosis, and plan of care documented in the resident's note.  

## 2016-04-30 ENCOUNTER — Encounter: Payer: Medicaid Other | Admitting: Internal Medicine

## 2016-05-01 ENCOUNTER — Ambulatory Visit (INDEPENDENT_AMBULATORY_CARE_PROVIDER_SITE_OTHER): Payer: Medicare Other | Admitting: Internal Medicine

## 2016-05-01 ENCOUNTER — Encounter: Payer: Self-pay | Admitting: *Deleted

## 2016-05-01 ENCOUNTER — Encounter: Payer: Self-pay | Admitting: Internal Medicine

## 2016-05-01 VITALS — BP 145/85 | HR 79 | Temp 97.8°F | Ht <= 58 in | Wt 186.2 lb

## 2016-05-01 DIAGNOSIS — Z794 Long term (current) use of insulin: Secondary | ICD-10-CM

## 2016-05-01 DIAGNOSIS — E1140 Type 2 diabetes mellitus with diabetic neuropathy, unspecified: Secondary | ICD-10-CM

## 2016-05-01 DIAGNOSIS — E1149 Type 2 diabetes mellitus with other diabetic neurological complication: Secondary | ICD-10-CM

## 2016-05-01 DIAGNOSIS — I10 Essential (primary) hypertension: Secondary | ICD-10-CM

## 2016-05-01 LAB — GLUCOSE, CAPILLARY: Glucose-Capillary: 70 mg/dL (ref 65–99)

## 2016-05-01 LAB — POCT GLYCOSYLATED HEMOGLOBIN (HGB A1C): Hemoglobin A1C: 7.4

## 2016-05-01 MED ORDER — LIRAGLUTIDE 18 MG/3ML ~~LOC~~ SOPN
PEN_INJECTOR | SUBCUTANEOUS | Status: DC
Start: 2016-05-01 — End: 2016-09-08

## 2016-05-01 MED ORDER — INSULIN PEN NEEDLE 31G X 8 MM MISC: Status: DC

## 2016-05-01 NOTE — Progress Notes (Signed)
   Subjective:    Patient ID: Nancy [name], female    DOB: [birth year], 60 y.o.   MRN: [id number]  HPI  Nancy [name] is a 61 year old woman with a PMH of T2DM, treated HCV, obesity, and HTN who comes to the for follow up on diabetes and HTN.  T2DM: Patient does not bring her glucometer today. She has been adherent with her Lantus 58U daily and metformin 500 mg BID. She reports BGs at home in the 90-250 range, with no episodes of hypoglycemia. She checks her BGs 2-3 times a day.   HTN: Patient has been taking and tolerating HCTZ 25 mg daily, Losartan 100 mg daily.    Review of Systems  Eyes: Negative for redness, itching and visual disturbance.  Endocrine: Negative for polydipsia, polyphagia and polyuria.  Neurological: Negative for light-headedness and headaches.       Objective:   Physical Exam  Cardiovascular: Normal rate, regular rhythm and normal heart sounds.   Pulmonary/Chest: Breath sounds normal. No respiratory distress. She has no wheezes.  Abdominal: Soft. Bowel sounds are normal. She exhibits no distension. There is no tenderness.  Psychiatric: She has a normal mood and affect. Her behavior is normal.       Assessment & Plan:   Please see problem based assessment and plan for details.

## 2016-05-01 NOTE — Patient Instructions (Signed)
Nancy [name],  It was a privilege taking care of you.  For you diabetes, we are adding a new medication. It is called Liraglutide (Victoza). It's something you inject like insulin, but it is not insulin. It may also help you lose SOME weight. Some side effects include stomach discomfort, but this will go away.  You will inject 0.1 mL into the skin (0.6 mg) for the week. After that, you will inject 0.2 mL into the skin (1.2 mg).  Again, it was a joy taking care of you.  Liraglutide injection What is this medicine? LIRAGLUTIDE (LIR a GLOO tide) is used to improve blood sugar control in adults with type 2 diabetes. This medicine may be used with other oral diabetes medicines. This medicine may be used for other purposes; ask your health care provider or pharmacist if you have questions. What should I tell my health care provider before I take this medicine? They need to know if you have any of these conditions: -endocrine tumors (MEN 2) or if someone in your family had these tumors -gallstones -high cholesterol -history of alcohol abuse problem -history of pancreatitis -kidney disease or if you are on dialysis -liver disease -previous swelling of the tongue, face, or lips with difficulty breathing, difficulty swallowing, hoarseness, or tightening of the throat -stomach problems -suicidal thoughts, plans, or attempt; a previous suicide attempt by you or a family member -thyroid cancer or if someone in your family had thyroid cancer -an unusual or allergic reaction to liraglutide, medicines, foods, dyes, or preservatives -pregnant or trying to get pregnant -breast-feeding How should I use this medicine? This medicine is for injection under the skin of your upper leg, stomach area, or upper arm. You will be taught how to prepare and give this medicine. Use exactly as directed. Take your medicine at regular intervals. Do not take it more often than directed. It is important that you put your  used needles and syringes in a special sharps container. Do not put them in a trash can. If you do not have a sharps container, call your pharmacist or healthcare provider to get one. A special MedGuide will be given to you by the pharmacist with each prescription and refill. Be sure to read this information carefully each time. Talk to your pediatrician regarding the use of this medicine in children. Special care may be needed. Overdosage: If you think you have taken too much of this medicine contact a poison control center or emergency room at once. NOTE: This medicine is only for you. Do not share this medicine with others. What if I miss a dose? If you miss a dose, take it as soon as you can. If it is almost time for your next dose, take only that dose. Do not take double or extra doses. What may interact with this medicine? -acetaminophen -atorvastatin -birth control pills -digoxin -griseofulvin -lisinoprilMany medications may cause changes in blood sugar, these include: -alcohol containing beverages -aspirin and aspirin-like drugs -chloramphenicol -chromium -diuretics -female hormones, such as estrogens or progestins, birth control pills -heart medicines -isoniazid -female hormones or anabolic steroids -medications for weight loss -medicines for allergies, asthma, cold, or cough -medicines for mental problems -medicines called MAO inhibitors - Nardil, Parnate, Marplan, Eldepryl -niacin -NSAIDS, such as ibuprofen -pentamidine -phenytoin -probenecid -quinolone antibiotics such as ciprofloxacin, levofloxacin, ofloxacin -some herbal dietary supplements -steroid medicines such as prednisone or cortisone -thyroid hormonesSome medications can hide the warning symptoms of low blood sugar (hypoglycemia). You may need to monitor your  blood sugar more closely if you are taking one of these medications. These include: -beta-blockers, often used for high blood pressure or heart problems  (examples include atenolol, metoprolol, propranolol) -clonidine -guanethidine -reserpine This list may not describe all possible interactions. Give your health care provider a list of all the medicines, herbs, non-prescription drugs, or dietary supplements you use. Also tell them if you smoke, drink alcohol, or use illegal drugs. Some items may interact with your medicine. What should I watch for while using this medicine? Visit your doctor or health care professional for regular checks on your progress. A test called the HbA1C (A1C) will be monitored. This is a simple blood test. It measures your blood sugar control over the last 2 to 3 months. You will receive this test every 3 to 6 months. Learn how to check your blood sugar. Learn the symptoms of low and high blood sugar and how to manage them. Always carry a quick-source of sugar with you in case you have symptoms of low blood sugar. Examples include hard sugar candy or glucose tablets. Make sure others know that you can choke if you eat or drink when you develop serious symptoms of low blood sugar, such as seizures or unconsciousness. They must get medical help at once. Tell your doctor or health care professional if you have high blood sugar. You might need to change the dose of your medicine. If you are sick or exercising more than usual, you might need to change the dose of your medicine. Do not skip meals. Ask your doctor or health care professional if you should avoid alcohol. Many nonprescription cough and cold products contain sugar or alcohol. These can affect blood sugar. Liraglutide pens and cartridges should never be shared. Even if the needle is changed, sharing may result in passing of viruses like hepatitis or HIV. Wear a medical ID bracelet or chain, and carry a card that describes your disease and details of your medicine and dosage times. Patients and their families should watch out for worsening depression or thoughts of  suicide. Also watch out for sudden changes in feelings such as feeling anxious, agitated, panicky, irritable, hostile, aggressive, impulsive, severely restless, overly excited and hyperactive, or not being able to sleep. If this happens, especially at the beginning of treatment or after a change in dose, call your health care professional. What side effects may I notice from receiving this medicine? Side effects that you should report to your doctor or health care professional as soon as possible: -allergic reactions like skin rash, itching or hives, swelling of the face, lips, or tongue -breathing problems -fever, chills -loss of appetite -signs and symptoms of low blood sugar such as feeling anxious, confusion, dizziness, increased hunger, unusually weak or tired, sweating, shakiness, cold, irritable, headache, blurred vision, fast heartbeat, loss of consciousness -trouble passing urine or change in the amount of urine -unusual stomach pain or upset -vomiting Side effects that usually do not require medical attention (Report these to your doctor or health care professional if they continue or are bothersome.): -constipation -diarrhea -fatigue -headache -nausea This list may not describe all possible side effects. Call your doctor for medical advice about side effects. You may report side effects to FDA at 1-800-FDA-1088. Where should I keep my medicine? Keep out of the reach of children. Store unopened pen in a refrigerator between 2 and 8 degrees C (36 and 46 degrees F). Do not freeze or use if the medicine has been frozen. Protect from  light and excessive heat. After you first use the pen, it can be stored at room temperature between 15 and 30 degrees C (59 and 86 degrees F) or in a refrigerator. Throw away your used pen after 30 days or after the expiration date, whichever comes first. Do not store your pen with the needle attached. If the needle is left on, medicine may leak from the  pen. NOTE: This sheet is a summary. It may not cover all possible information. If you have questions about this medicine, talk to your doctor, pharmacist, or health care provider.    2016, Elsevier/Gold Standard. (2014-02-01 10:19:14)

## 2016-05-02 MED ORDER — METFORMIN HCL 500 MG PO TABS: 500.0000 mg | ORAL_TABLET | Freq: Two times a day (BID) | ORAL | Status: DC

## 2016-05-02 NOTE — Assessment & Plan Note (Signed)
A: A1c 7.4, close to goal. I think we have nearly maximized the benefit we will achieve with metformin and insulin. I think an additional agent, such as a GLP-1 agonist, will help her achieve her goal of 7.4 without causing hypoglycemia. This may also assist her with weight loss, given that her BMI is currently 38.9  P: Start liraglutide 0.6 mg for 1 week, then 1.2 mg daily thereafter. I will her in two weeks to see how she is tolerating and using the medication. Pen needles prescribed. Continue Lantus 58U nighly, metformin 500 mg BID

## 2016-05-02 NOTE — Assessment & Plan Note (Signed)
BP slightly elevated at 148/85 today, but she has been consistently well controlled on her current regimen in the past. With will defer augmenting her therapy at this time, hoping that weight loss from [place] will address HTN.

## 2016-05-05 NOTE — Progress Notes (Signed)
Internal Medicine Clinic Attending  Case discussed with Dr. Ford at the time of the visit.  We reviewed the resident's history and exam and pertinent patient test results.  I agree with the assessment, diagnosis, and plan of care documented in the resident's note.  

## 2016-05-08 ENCOUNTER — Telehealth: Payer: Self-pay | Admitting: Dietician

## 2016-05-08 NOTE — Addendum Note (Signed)
Addended by: Resa Miner on: 05/08/2016 01:43 PM   Modules accepted: Medications

## 2016-05-08 NOTE — Telephone Encounter (Addendum)
Nancy [name] called back and says about her new diabetes medicine- "It keeps it low, highest has been 142, had a 59 that woke.her up and she ate 2 oranges and drank orange juice before going back to bed". She says she is eating less and understands that the victoza helps with decreasing her appetite.  She is taking 58 units of lantus every morning.  Advised her to lower the lantus  to 54 units tomorrow and to call or lower her lantus by another 4 units if she has another blood sugar below 70.   She verbalized understanding by repeating back instructions  She id due to follow up 06/01/16 and does not have a follow up visit scheduled as yet.

## 2016-05-08 NOTE — Telephone Encounter (Signed)
Calling to follow up on how she is doing with her new diabetes medicine - victoza

## 2016-06-02 ENCOUNTER — Encounter: Payer: Self-pay | Admitting: *Deleted

## 2016-06-02 ENCOUNTER — Other Ambulatory Visit: Payer: Medicare Other

## 2016-06-02 DIAGNOSIS — B182 Chronic viral hepatitis C: Secondary | ICD-10-CM

## 2016-06-03 LAB — HEPATITIS C RNA QUANTITATIVE: HCV QUANT: NOT DETECTED [IU]/mL (ref <15)

## 2016-06-04 ENCOUNTER — Other Ambulatory Visit: Payer: Medicare Other

## 2016-06-18 ENCOUNTER — Encounter: Payer: Self-pay | Admitting: Internal Medicine

## 2016-06-18 ENCOUNTER — Ambulatory Visit (INDEPENDENT_AMBULATORY_CARE_PROVIDER_SITE_OTHER): Payer: Medicare Other | Admitting: Internal Medicine

## 2016-06-18 VITALS — BP 138/91 | HR 80 | Temp 97.7°F | Ht <= 58 in | Wt 178.0 lb

## 2016-06-18 DIAGNOSIS — K7400 Hepatic fibrosis, unspecified: Secondary | ICD-10-CM

## 2016-06-18 DIAGNOSIS — B182 Chronic viral hepatitis C: Secondary | ICD-10-CM

## 2016-06-18 DIAGNOSIS — Z23 Encounter for immunization: Secondary | ICD-10-CM | POA: Diagnosis not present

## 2016-06-18 NOTE — Assessment & Plan Note (Signed)
Now considered cured.  Discussed that Ab test will always be positive, won't be able to donate blood.

## 2016-06-18 NOTE — Addendum Note (Signed)
Addended by: Myrtis Hopping A on: 06/18/2016 12:03 PM   Modules accepted: Orders

## 2016-06-18 NOTE — Assessment & Plan Note (Signed)
Moderate fibrosis with no lab abnormalities.  No indication for continued [place] screening.

## 2016-06-18 NOTE — Progress Notes (Signed)
   Subjective:    Patient ID: Nancy [name], female    DOB: November 29, 1956, 60 y.o.   MRN: [id number]  HPI Here for follow up of hepatitis C.    Has genotype 1b, viral load of 4.5 million, started and now completed elbasvir/grazoprevir for 12 weeks and SVR 12 now undetectable confirming cure.  Did have an elastography and F2/3 noted.  Does not drink alcohol.  To finish hepatitis A and B series today.    Review of Systems  Gastrointestinal: Negative for nausea and diarrhea.  Skin: Negative for rash.  Neurological: Negative for light-headedness and headaches.       Objective:   Physical Exam  Constitutional: She appears well-developed and well-nourished. No distress.  Eyes: No scleral icterus.  Cardiovascular: Normal rate, regular rhythm and normal heart sounds.   No murmur heard. Pulmonary/Chest: Effort normal and breath sounds normal. No respiratory distress.  Skin: No rash noted.   Social History   Social History  . Marital Status: Single    Spouse Name: N/A  . Number of Children: 0  . Years of Education: 11   Occupational History  . CNA    Social History Main Topics  . Smoking status: Never Smoker   . Smokeless tobacco: Never Used  . Alcohol Use: No  . Drug Use: No  . Sexual Activity: Not on file   Other Topics Concern  . Not on file   Social History Narrative   Financial assistance approved for 100% discount at [facility] and has [facility] card per [employer]   09/26/2010         Assessment & Plan:

## 2016-06-27 ENCOUNTER — Other Ambulatory Visit: Payer: Self-pay | Admitting: Internal Medicine

## 2016-06-29 NOTE — Telephone Encounter (Signed)
Pt is out of this need it today.

## 2016-06-30 NOTE — Telephone Encounter (Signed)
Requesting test strips and Lantus to be filled @ [facility] [address]. Please call pt back.

## 2016-06-30 NOTE — Telephone Encounter (Signed)
Pt is calling about the refills again

## 2016-07-21 ENCOUNTER — Telehealth: Payer: Self-pay | Admitting: *Deleted

## 2016-07-21 DIAGNOSIS — E785 Hyperlipidemia, unspecified: Secondary | ICD-10-CM

## 2016-07-21 NOTE — Telephone Encounter (Signed)
Pharmacist at [facility] calls and states he was reviewing pt's medications and she is not on a statin, he states she told him she has never been on one and he feels that at her age and diabetes it would help. Thanked him and reviewed he medication history, she has been on 2 statins in the past, I told him I would send a note to pcp

## 2016-07-23 ENCOUNTER — Ambulatory Visit (INDEPENDENT_AMBULATORY_CARE_PROVIDER_SITE_OTHER): Payer: Medicare Other | Admitting: Internal Medicine

## 2016-07-23 VITALS — BP 135/79 | HR 66 | Temp 97.8°F | Wt 179.6 lb

## 2016-07-23 DIAGNOSIS — J302 Other seasonal allergic rhinitis: Secondary | ICD-10-CM | POA: Diagnosis not present

## 2016-07-23 DIAGNOSIS — E1140 Type 2 diabetes mellitus with diabetic neuropathy, unspecified: Secondary | ICD-10-CM | POA: Diagnosis not present

## 2016-07-23 DIAGNOSIS — E1149 Type 2 diabetes mellitus with other diabetic neurological complication: Secondary | ICD-10-CM

## 2016-07-23 MED ORDER — FLUTICASONE PROPIONATE 50 MCG/ACT NA SUSP: 1.0000 | Freq: Every day | NASAL | 2 refills | Status: DC

## 2016-07-23 MED ORDER — LORATADINE 10 MG PO TABS: 10.0000 mg | ORAL_TABLET | Freq: Every day | ORAL | 2 refills | Status: DC

## 2016-07-23 MED ORDER — ATORVASTATIN CALCIUM 40 MG PO TABS: 40.0000 mg | ORAL_TABLET | Freq: Every day | ORAL | 11 refills | Status: DC

## 2016-07-23 NOTE — Assessment & Plan Note (Deleted)
A: Ms. Domino has a calculated ASCVD risk >7.5   P: will call in

## 2016-07-23 NOTE — Assessment & Plan Note (Signed)
Patient is here for 1 week duration of sore throat and hoarseness. She says that when she wakes up, it is not present, but by the afternoon she has those symptoms. She denies cough, runny nose, has some headaches. Denies dysphagia or odynophagia.  Denies n/v.  Says her GERD symptoms are well controlled on the omeprazole which she takes daily. She mentions that she lives in an apartment where they are renovating the building for a month now. She breathes the dust in when she enters and exit the building, which may be exacerbating her symptoms. On exam, oropharynx clear. No adenopathy. Lungs  CTAB.   A: allergic rhinitis  P -claritin daily -flonase daily -pt given masks to wear -advised to continue prilosec at current dose

## 2016-07-23 NOTE — Assessment & Plan Note (Signed)
Pharmacist at [facility] told her that she should get started on a statin as she has diabetes.   -Diabetes qualifies her for high intensity statin  -start atorva 40 mg QD

## 2016-07-23 NOTE — Progress Notes (Signed)
Case discussed with Dr. Saraiya at the time of the visit. We reviewed the resident's history and exam and pertinent patient test results. I agree with the assessment, diagnosis, and plan of care documented in the resident's note. 

## 2016-07-23 NOTE — Progress Notes (Signed)
   CC: sore throat/hoarseness HPI: [name] is a 60 y.o. woman with PMH noted below here for 1 week duration of sore throat/hoarseness  Please see Problem List/A&P for the status of the patient's chronic medical problems   Past Medical History:  Diagnosis Date  . Carpal tunnel syndrome   . Carpal tunnel syndrome, bilateral    confirmed on nerve conduction studies  . Diabetes mellitus type 2, uncontrolled ([place])   . Diabetic peripheral neuropathy ([place])   . GERD (gastroesophageal reflux disease)   . Heat edema   . History of chest pain    negative cardiolite September 2007.  ETT-myoview 12/10 with EF 76%, no ischemia,   . History of endometriosis   . History of Helicobacter infection 11/07  . Hyperlipidemia     Review of Systems:  Constitutional: Negative for fever, chills, weight loss and malaise/fatigue.  HEENT: No headaches, vision problems, cough, hearing problems. Has some sore throat and hoarseness by the afternoon Respiratory: Negative for cough, shortness of breath and wheezing.  Gastrointestinal: Negative for heartburn, nausea, vomiting, abdominal pain, diarrhea   Physical Exam: Vitals:   07/23/16 1102  BP: 135/79  Pulse: 66  Temp: 97.8 F (36.6 C)  TempSrc: Oral  Weight: 179 lb 9.6 oz (81.5 kg)    General: A&O, in NAD HEENT: MMM and oropharynx clear without exudates Neck: supple, midline trachea, no cervical lymphadenopathy  CV: RRR, normal s1, s2, no m/r/g Resp: equal and symmetric breath sounds, no wheezing or crackles  Abd: Soft NTND Extremities: no edema  Assessment & Plan:   See encounters tab for problem based medical decision making. Patient discussed with Dr. Eppie Gibson

## 2016-07-23 NOTE — Patient Instructions (Signed)
Thank you for your visit today  Please use the claritin and the flonase daily  Please use the masks when entering or exiting your building where they are working  Please take the atorvastatin

## 2016-09-02 ENCOUNTER — Other Ambulatory Visit: Payer: Self-pay | Admitting: Internal Medicine

## 2016-09-02 DIAGNOSIS — K219 Gastro-esophageal reflux disease without esophagitis: Secondary | ICD-10-CM

## 2016-09-08 ENCOUNTER — Ambulatory Visit (INDEPENDENT_AMBULATORY_CARE_PROVIDER_SITE_OTHER): Payer: Medicare Other | Admitting: Internal Medicine

## 2016-09-08 ENCOUNTER — Other Ambulatory Visit: Payer: Self-pay | Admitting: Internal Medicine

## 2016-09-08 VITALS — BP 115/76 | HR 85 | Temp 97.4°F | Ht <= 58 in | Wt 175.6 lb

## 2016-09-08 DIAGNOSIS — E1310 Other specified diabetes mellitus with ketoacidosis without coma: Secondary | ICD-10-CM

## 2016-09-08 DIAGNOSIS — K5900 Constipation, unspecified: Secondary | ICD-10-CM | POA: Insufficient documentation

## 2016-09-08 DIAGNOSIS — K5909 Other constipation: Secondary | ICD-10-CM

## 2016-09-08 DIAGNOSIS — Z23 Encounter for immunization: Secondary | ICD-10-CM

## 2016-09-08 DIAGNOSIS — Z794 Long term (current) use of insulin: Secondary | ICD-10-CM

## 2016-09-08 DIAGNOSIS — M01X0 Direct infection of unspecified joint in infectious and parasitic diseases classified elsewhere: Secondary | ICD-10-CM

## 2016-09-08 DIAGNOSIS — Z0000 Encounter for general adult medical examination without abnormal findings: Secondary | ICD-10-CM

## 2016-09-08 DIAGNOSIS — I10 Essential (primary) hypertension: Secondary | ICD-10-CM

## 2016-09-08 DIAGNOSIS — E1140 Type 2 diabetes mellitus with diabetic neuropathy, unspecified: Secondary | ICD-10-CM

## 2016-09-08 DIAGNOSIS — Z760 Encounter for issue of repeat prescription: Secondary | ICD-10-CM

## 2016-09-08 DIAGNOSIS — K219 Gastro-esophageal reflux disease without esophagitis: Secondary | ICD-10-CM

## 2016-09-08 DIAGNOSIS — E1149 Type 2 diabetes mellitus with other diabetic neurological complication: Secondary | ICD-10-CM

## 2016-09-08 DIAGNOSIS — M62838 Other muscle spasm: Secondary | ICD-10-CM

## 2016-09-08 DIAGNOSIS — B199 Unspecified viral hepatitis without hepatic coma: Secondary | ICD-10-CM

## 2016-09-08 MED ORDER — METFORMIN HCL 1000 MG PO TABS: 1000.0000 mg | ORAL_TABLET | Freq: Two times a day (BID) | ORAL | 3 refills | Status: DC

## 2016-09-08 MED ORDER — SENNA 8.6 MG PO TABS: 2.0000 | ORAL_TABLET | Freq: Every day | ORAL | 0 refills | Status: DC

## 2016-09-08 MED ORDER — OMEPRAZOLE 20 MG PO CPDR: DELAYED_RELEASE_CAPSULE | ORAL | 2 refills | Status: DC

## 2016-09-08 MED ORDER — ASPIRIN EC 81 MG PO TBEC: 81.0000 mg | DELAYED_RELEASE_TABLET | Freq: Every day | ORAL | Status: DC

## 2016-09-08 MED ORDER — DOCUSATE SODIUM 250 MG PO CAPS
250.0000 mg | ORAL_CAPSULE | Freq: Every day | ORAL | 0 refills | Status: DC
Start: 2016-09-08 — End: 2016-09-18

## 2016-09-08 MED ORDER — LOSARTAN POTASSIUM 100 MG PO TABS: 100.0000 mg | ORAL_TABLET | Freq: Every day | ORAL | 3 refills | Status: DC

## 2016-09-08 MED ORDER — GLUCOSE BLOOD VI STRP: ORAL_STRIP | 4 refills | Status: DC

## 2016-09-08 MED ORDER — INSULIN GLARGINE 100 UNIT/ML ~~LOC~~ SOLN: SUBCUTANEOUS | 4 refills | Status: DC

## 2016-09-08 MED ORDER — FLUTICASONE PROPIONATE 50 MCG/ACT NA SUSP: 1.0000 | Freq: Every day | NASAL | 2 refills | Status: DC

## 2016-09-08 MED ORDER — ATORVASTATIN CALCIUM 40 MG PO TABS: 40.0000 mg | ORAL_TABLET | Freq: Every day | ORAL | 3 refills | Status: DC

## 2016-09-08 MED ORDER — INSULIN PEN NEEDLE 31G X 8 MM MISC: 3 refills | Status: DC

## 2016-09-08 MED ORDER — LORATADINE 10 MG PO TABS: 10.0000 mg | ORAL_TABLET | Freq: Every day | ORAL | 3 refills | Status: DC

## 2016-09-08 MED ORDER — ACCU-CHEK FASTCLIX LANCETS MISC: 5 refills | Status: DC

## 2016-09-08 MED ORDER — DICLOFENAC SODIUM 1 % TD GEL: 4.0000 g | Freq: Four times a day (QID) | TRANSDERMAL | 5 refills | Status: DC

## 2016-09-08 MED ORDER — HYDROCHLOROTHIAZIDE 25 MG PO TABS: ORAL_TABLET | ORAL | 2 refills | Status: DC

## 2016-09-08 MED ORDER — METHOCARBAMOL 500 MG PO TABS: 500.0000 mg | ORAL_TABLET | Freq: Three times a day (TID) | ORAL | 3 refills | Status: DC | PRN

## 2016-09-08 MED ORDER — LIRAGLUTIDE 18 MG/3ML ~~LOC~~ SOPN: PEN_INJECTOR | SUBCUTANEOUS | 5 refills | Status: DC

## 2016-09-08 NOTE — Patient Instructions (Signed)
It was a pleasure to meet you Nancy [name].  I will prescribe 2 medications to try to help with your constipation called Senokot and Colace. Please call us in 1 week if your constipation is not improving or you are having worsening symptoms. You may try fiber supplements for relief as well.  I have refilled your medications. I have also increased your Metformin to 1000 mg twice a day.  High-Fiber Diet Fiber, also called dietary fiber, is a type of carbohydrate found in fruits, vegetables, whole grains, and beans. A high-fiber diet can have many health benefits. Your health care provider may recommend a high-fiber diet to help:  Prevent constipation. Fiber can make your bowel movements more regular.  Lower your cholesterol.  Relieve hemorrhoids, uncomplicated diverticulosis, or irritable bowel syndrome.  Prevent overeating as part of a weight-loss plan.  Prevent heart disease, type 2 diabetes, and certain cancers. WHAT IS MY PLAN? The recommended daily intake of fiber includes:  38 grams for men under age 16.  38 grams for men over age 38.  42 grams for women under age 70.  63 grams for women over age 36. You can get the recommended daily intake of dietary fiber by eating a variety of fruits, vegetables, grains, and beans. Your health care provider may also recommend a fiber supplement if it is not possible to get enough fiber through your diet. WHAT DO I NEED TO KNOW ABOUT A HIGH-FIBER DIET?  Fiber supplements have not been widely studied for their effectiveness, so it is better to get fiber through food sources.  Always check the fiber content on thenutrition facts label of any prepackaged food. Look for foods that contain at least 5 grams of fiber per serving.  Ask your dietitian if you have questions about specific foods that are related to your condition, especially if those foods are not listed in the following section.  Increase your daily fiber consumption gradually.  Increasing your intake of dietary fiber too quickly may cause bloating, cramping, or gas.  Drink plenty of water. Water helps you to digest fiber. WHAT FOODS CAN I EAT? Grains Whole-grain breads. Multigrain cereal. Oats and oatmeal. Brown rice. Barley. Bulgur wheat. [place]. Bran muffins. Popcorn. Rye wafer crackers. Vegetables Sweet potatoes. Spinach. Kale. Artichokes. Cabbage. Broccoli. Green peas. Carrots. Squash. Fruits Berries. Pears. Apples. Oranges. Avocados. Prunes and raisins. Dried figs. Meats and Other Protein Sources Navy, kidney, pinto, and soy beans. Split peas. Lentils. Nuts and seeds. Dairy Fiber-fortified yogurt. Beverages Fiber-fortified soy milk. Fiber-fortified orange juice. Other Fiber bars. The items listed above may not be a complete list of recommended foods or beverages. Contact your dietitian for more options. WHAT FOODS ARE NOT RECOMMENDED? Grains White bread. Pasta made with refined flour. White rice. Vegetables Fried potatoes. Canned vegetables. Well-cooked vegetables.  Fruits Fruit juice. Cooked, strained fruit. Meats and Other Protein Sources Fatty cuts of meat. Fried Sales executive or fried fish. Dairy Milk. Yogurt. Cream cheese. Sour cream. Beverages Soft drinks. Other Cakes and pastries. Butter and oils. The items listed above may not be a complete list of foods and beverages to avoid. Contact your dietitian for more information. WHAT ARE SOME TIPS FOR INCLUDING HIGH-FIBER FOODS IN MY DIET?  Eat a wide variety of high-fiber foods.  Make sure that half of all grains consumed each day are whole grains.  Replace breads and cereals made from refined flour or white flour with whole-grain breads and cereals.  Replace white rice with brown rice, bulgur wheat, or millet.  Start the day with a breakfast that is high in fiber, such as a cereal that contains at least 5 grams of fiber per serving.  Use beans in place of meat in soups, salads, or  pasta.  Eat high-fiber snacks, such as berries, raw vegetables, nuts, or popcorn.   This information is not intended to replace advice given to you by your health care provider. Make sure you discuss any questions you have with your health care provider.   Document Released: 11/23/2005 Document Revised: 12/14/2014 Document Reviewed: 05/08/2014 Elsevier Interactive Patient Education [employer].

## 2016-09-08 NOTE — Progress Notes (Signed)
   CC: Constipation  HPI:  [name] is a 60 y.o. female with PMH as listed below who presents for management of constipation.   Patient states that normally she does not have frequent bowel movements, usually 1-2 every 2 weeks. About 6 months ago, she noticed her movements were becoming less frequent than before. For the last 2 weeks, she has only passed 1 or 2 small stool balls and is having abdominal pain related to her constipation. She reports feeling bloated with increase in abdominal size. She has the sensation to defecate, but is unable to even with straining. She has tried metamucil, "cleanse & move" supplement, a citrus drink, and another medication that begins with an "s" without relief. She had a normal colonoscopy in 2015. She is not taking any narcotic medications.  Of note, patient was started on Victoza about 4 months ago which may cause constipation in a certain percent of patients. She takes Metformin 500 mg once daily for her diabetes. She states she has not had any issues with metformin tolerance.  Patient is also requesting a refill of all her medications for 90 day supplies today which I have refilled.  She is agreeable to receive the flu shot today.  Past Medical History:  Diagnosis Date  . Carpal tunnel syndrome   . Carpal tunnel syndrome, bilateral    confirmed on nerve conduction studies  . Diabetes mellitus type 2, uncontrolled ([place])   . Diabetic peripheral neuropathy ([place])   . GERD (gastroesophageal reflux disease)   . Heat edema   . History of chest pain    negative cardiolite September 2007.  ETT-myoview 12/10 with EF 76%, no ischemia,   . History of endometriosis   . History of Helicobacter infection 11/07  . Hyperlipidemia     Review of Systems:   Review of Systems  Constitutional: Negative for chills and fever.  Respiratory: Negative for shortness of breath.   Cardiovascular: Negative for chest pain.  Gastrointestinal: Positive for  abdominal pain and constipation. Negative for diarrhea, nausea and vomiting.     Physical Exam:  Vitals:   09/08/16 1437  BP: 115/76  Pulse: 85  Temp: 97.4 F (36.3 C)  TempSrc: Oral  SpO2: 100%  Weight: 175 lb 9.6 oz (79.7 kg)  Height: 4\' 10"  (1.473 m)   Physical Exam  Constitutional: She is oriented to person, place, and time. She appears well-developed and well-nourished. No distress.  Cardiovascular: Normal rate and regular rhythm.   Pulmonary/Chest: Effort normal. No respiratory distress. She has no wheezes.  Abdominal: Soft.  Hypoactive bowel sounds, mild generalized tenderness in abdomen without obvious palpable stool burden or mass.  Neurological: She is alert and oriented to person, place, and time.    Assessment & Plan:   See Encounters Tab for problem based charting.  Patient discussed with Dr. Lynnae January

## 2016-09-08 NOTE — Assessment & Plan Note (Signed)
Patient takes Lantus 58 units qam, Victoza 1.2 mg daily, and Metformin 500 mg daily for diabetes. Unclear why she is not on a higher Metformin dose as she does not report any intolerance or have renal dysfunction.   Will increase Metformin to 1000 mg BID, patient is agreeable.

## 2016-09-08 NOTE — Assessment & Plan Note (Signed)
Patient states that normally she does not have frequent bowel movements, usually 1-2 every 2 weeks. About 6 months ago, she noticed her movements were becoming less frequent than before. For the last 2 weeks, she has only passed 1 or 2 small stool balls and is having abdominal pain related to her constipation. She reports feeling bloated with increase in abdominal size. She has the sensation to defecate, but is unable to even with straining. She has tried metamucil, "cleanse & move" supplement, a citrus drink, and another medication that begins with an "s" without relief. She had a normal colonoscopy in 2015. She is not taking any narcotic medications.  Patient with chronic constipation that is worsening. Will prescribe a stool softener and pro-motility agent to help try to move things along. Have also advised patient to try fiber supplements. Patient will call us in 1 week if there is no improvement or symptoms worsen. Would consider escalating therapy at that time with consideration of suppository, enema, and/or disimpaction. -Start scheduled Senna and Colace, 2 week prescription sent -Patient advised to try fiber supplement -f/u in 1 week if no improvement -Consider psyllium, suppository, enema, disimpaction if no bowel movement

## 2016-09-08 NOTE — Assessment & Plan Note (Signed)
Flu shot given today

## 2016-09-09 NOTE — Progress Notes (Signed)
Internal Medicine Clinic Attending  Case discussed with Dr. Patel,Vishal at the time of the visit.  We reviewed the resident's history and exam and pertinent patient test results.  I agree with the assessment, diagnosis, and plan of care documented in the resident's note.  

## 2016-09-18 ENCOUNTER — Ambulatory Visit (INDEPENDENT_AMBULATORY_CARE_PROVIDER_SITE_OTHER): Payer: Medicare Other | Admitting: Internal Medicine

## 2016-09-18 ENCOUNTER — Encounter: Payer: Self-pay | Admitting: Internal Medicine

## 2016-09-18 VITALS — BP 133/93 | HR 79 | Temp 97.6°F | Ht <= 58 in | Wt 175.9 lb

## 2016-09-18 DIAGNOSIS — K5909 Other constipation: Secondary | ICD-10-CM

## 2016-09-18 DIAGNOSIS — G5602 Carpal tunnel syndrome, left upper limb: Secondary | ICD-10-CM | POA: Diagnosis not present

## 2016-09-18 DIAGNOSIS — G5603 Carpal tunnel syndrome, bilateral upper limbs: Secondary | ICD-10-CM

## 2016-09-18 MED ORDER — SENNA 8.6 MG PO TABS: 2.0000 | ORAL_TABLET | Freq: Every day | ORAL | 0 refills | Status: DC | PRN

## 2016-09-18 NOTE — Assessment & Plan Note (Signed)
Patient with b/l CTS, s/p right carpal tunnel release. Patient declined release on the left as she did not find much relief after her right sided procedure. She states her pain has been controlled well until recent flare up of her left sided carpal tunnel pain. Pain is sharp at her wrist, worse with movement, and associated with some decreased sensation over her thumb. She has a wrist brace at home but has not used this recently. She has not tried any medications for her recent pain. She says the pain is otherwise bearable.  Pain elicited in left wrist with Tinel's and Phalen's exam. Patient advised to wear wrist brace at night and limit repetitive movements of wrist. She can try Ibuprofen as needed as well as Tylenol (= or <2000 mg daily).

## 2016-09-18 NOTE — Progress Notes (Signed)
   CC: Constipation  HPI:  [name] is a 60 y.o. female with PMH as listed below who presents for follow up management of her constipation as well as complaint of carpal tunnel pain.  Constipation: Patient seen in [facility] on 10/3 for aggravating constipation for the preceding 1-2 weeks. We started her on scheduled Senna, stool softener, and fiber supplementation. Patient states that she had a large relieving bowel movement passing hard stool the day after seen. She is now having 1-2 BMs a day, mostly solid stool with occasional loose stool. She continues to take Senna, stool softener, fiber, and a citrus drink mix daily. Abdominal discomfort is improving. No diarrhea, constipation, BRBPR, or melena. Had a normal colonoscopy in 2015.  Bilateral Carpal Tunnel Syndrome: Patient with b/l CTS, s/p right carpal tunnel release. Patient declined release on the left as she did not find much relief after her right sided procedure. She states her pain has been controlled well until recent flare up of her left sided carpal tunnel pain. Pain is sharp at her wrist, worse with movement, and associated with some decreased sensation over her thumb. She has a wrist brace at home but has not used this recently. She has not tried any medications for her recent pain. She says the pain is otherwise bearable.  Past Medical History:  Diagnosis Date  . Carpal tunnel syndrome   . Carpal tunnel syndrome, bilateral    confirmed on nerve conduction studies  . Diabetes mellitus type 2, uncontrolled ([place])   . Diabetic peripheral neuropathy ([place])   . GERD (gastroesophageal reflux disease)   . Heat edema   . History of chest pain    negative cardiolite September 2007.  ETT-myoview 12/10 with EF 76%, no ischemia,   . History of endometriosis   . History of Helicobacter infection 11/07  . Hyperlipidemia     Review of Systems:   Review of Systems  Gastrointestinal: Negative for blood in stool, constipation,  diarrhea and melena.       Intermittent mild abdominal pain  Musculoskeletal:       Left wrist pain  Neurological:       Decreased sensation over left thumb     Physical Exam:  Vitals:   09/18/16 1312  BP: (!) 133/93  Pulse: 79  Temp: 97.6 F (36.4 C)  TempSrc: Oral  SpO2: 100%  Weight: 175 lb 14.4 oz (79.8 kg)  Height: 4\' 10"  (1.473 m)   Physical Exam  Constitutional: She is oriented to person, place, and time. She appears well-nourished. No distress.  Cardiovascular: Normal rate and regular rhythm.   Pulmonary/Chest: Effort normal. No respiratory distress. She has no wheezes. She has no rales.  Abdominal: Soft.  Bowel sounds present but quiet, mild tenderness to palpation througout, no palpable mass  Neurological: She is alert and oriented to person, place, and time.  Positive Tinel's and Phalen's sign. Decreased sensation over thenar eminence without atrophy.  Skin: Skin is warm.  Psychiatric: She has a normal mood and affect.    Assessment & Plan:   See Encounters Tab for problem based charting.  Patient discussed with Dr. Lynnae January

## 2016-09-18 NOTE — Assessment & Plan Note (Signed)
Patient seen in [facility] on 10/3 for aggravating constipation for the preceding 1-2 weeks. We started her on scheduled Senna, stool softener, and fiber supplementation. Patient states that she had a large relieving bowel movement passing hard stool the day after seen. She is now having 1-2 BMs a day, mostly solid stool with occasional loose stool. She continues to take Senna, stool softener, fiber, and a citrus drink mix daily. Abdominal discomfort is improving. No diarrhea, constipation, BRBPR, or melena. Had a normal colonoscopy in 2015.  Patient's constipation resolved now with relief. -Continue fiber supplement -Change Senna and stool softener to as needed

## 2016-09-18 NOTE — Patient Instructions (Addendum)
It was a pleasure to see you again Nancy [name].  I am glad you are feeling better. Please continue the fiber supplements. You can continue the Senna and stool softener as needed for constipation. If you start having diarrhea, hold off on these. We would like to see solid to semi-solid stool  For your left wrist carpal tunnel, try using your wrist brace at night to limit movement. You can try over the counter Ibuprofen or Tylenol if needed as well. Try to limit daily total Tylenol to 2000 mg per day.  Please follow up with your regular doctor as scheduled or sooner if needed.

## 2016-09-21 NOTE — Progress Notes (Signed)
Internal Medicine Clinic Attending  Case discussed with Dr. Patel,Vishal at the time of the visit.  We reviewed the resident's history and exam and pertinent patient test results.  I agree with the assessment, diagnosis, and plan of care documented in the resident's note.  

## 2016-10-26 DIAGNOSIS — E113292 Type 2 diabetes mellitus with mild nonproliferative diabetic retinopathy without macular edema, left eye: Secondary | ICD-10-CM | POA: Diagnosis not present

## 2016-11-04 ENCOUNTER — Encounter: Payer: Self-pay | Admitting: Internal Medicine

## 2016-11-04 ENCOUNTER — Ambulatory Visit (INDEPENDENT_AMBULATORY_CARE_PROVIDER_SITE_OTHER): Payer: Medicare Other | Admitting: Internal Medicine

## 2016-11-04 VITALS — BP 128/80 | HR 80 | Temp 97.7°F | Ht <= 58 in | Wt 177.2 lb

## 2016-11-04 DIAGNOSIS — E1149 Type 2 diabetes mellitus with other diabetic neurological complication: Secondary | ICD-10-CM | POA: Diagnosis not present

## 2016-11-04 DIAGNOSIS — E1121 Type 2 diabetes mellitus with diabetic nephropathy: Secondary | ICD-10-CM | POA: Diagnosis not present

## 2016-11-04 DIAGNOSIS — Z794 Long term (current) use of insulin: Secondary | ICD-10-CM | POA: Diagnosis not present

## 2016-11-04 DIAGNOSIS — K5909 Other constipation: Secondary | ICD-10-CM

## 2016-11-04 DIAGNOSIS — B199 Unspecified viral hepatitis without hepatic coma: Secondary | ICD-10-CM

## 2016-11-04 DIAGNOSIS — E1165 Type 2 diabetes mellitus with hyperglycemia: Secondary | ICD-10-CM | POA: Diagnosis not present

## 2016-11-04 DIAGNOSIS — M1990 Unspecified osteoarthritis, unspecified site: Secondary | ICD-10-CM

## 2016-11-04 DIAGNOSIS — Z79899 Other long term (current) drug therapy: Secondary | ICD-10-CM

## 2016-11-04 DIAGNOSIS — I10 Essential (primary) hypertension: Secondary | ICD-10-CM | POA: Diagnosis not present

## 2016-11-04 DIAGNOSIS — E1310 Other specified diabetes mellitus with ketoacidosis without coma: Secondary | ICD-10-CM

## 2016-11-04 DIAGNOSIS — K219 Gastro-esophageal reflux disease without esophagitis: Secondary | ICD-10-CM

## 2016-11-04 DIAGNOSIS — Z760 Encounter for issue of repeat prescription: Secondary | ICD-10-CM

## 2016-11-04 DIAGNOSIS — M01X0 Direct infection of unspecified joint in infectious and parasitic diseases classified elsewhere: Secondary | ICD-10-CM

## 2016-11-04 LAB — POCT GLYCOSYLATED HEMOGLOBIN (HGB A1C): Hemoglobin A1C: 6.3

## 2016-11-04 LAB — GLUCOSE, CAPILLARY: GLUCOSE-CAPILLARY: 97 mg/dL (ref 65–99)

## 2016-11-04 NOTE — Patient Instructions (Signed)
It was a pleasure to meet you today Nancy [name]!   Call the clinic if you have not been called to schedule the appointment with the GI doctor within the next few weeks  You are doing a great job controlling your diabetes and high blood pressure. Keep up the great work Schedule a follow up appointment to see Dr. Hetty Ely in 6 months.   Diabetes Mellitus and Food It is important for you to manage your blood sugar (glucose) level. Your blood glucose level can be greatly affected by what you eat. Eating healthier foods in the appropriate amounts throughout the day at about the same time each day will help you control your blood glucose level. It can also help slow or prevent worsening of your diabetes mellitus. Healthy eating may even help you improve the level of your blood pressure and reach or maintain a healthy weight. General recommendations for healthful eating and cooking habits include:  Eating meals and snacks regularly. Avoid going long periods of time without eating to lose weight.  Eating a diet that consists mainly of plant-based foods, such as fruits, vegetables, nuts, legumes, and whole grains.  Using low-heat cooking methods, such as baking, instead of high-heat cooking methods, such as deep frying. Work with your dietitian to make sure you understand how to use the Nutrition Facts information on food labels. How can food affect me? Carbohydrates  Carbohydrates affect your blood glucose level more than any other type of food. Your dietitian will help you determine how many carbohydrates to eat at each meal and teach you how to count carbohydrates. Counting carbohydrates is important to keep your blood glucose at a healthy level, especially if you are using insulin or taking certain medicines for diabetes mellitus. Alcohol  Alcohol can cause sudden decreases in blood glucose (hypoglycemia), especially if you use insulin or take certain medicines for diabetes mellitus. Hypoglycemia can be a  life-threatening condition. Symptoms of hypoglycemia (sleepiness, dizziness, and disorientation) are similar to symptoms of having too much alcohol. If your health care provider has given you approval to drink alcohol, do so in moderation and use the following guidelines:  Women should not have more than one drink per day, and men should not have more than two drinks per day. One drink is equal to:  12 oz of beer.  5 oz of wine.  1 oz of hard liquor.  Do not drink on an empty stomach.  Keep yourself hydrated. Have water, diet soda, or unsweetened iced tea.  Regular soda, juice, and other mixers might contain a lot of carbohydrates and should be counted. What foods are not recommended? As you make food choices, it is important to remember that all foods are not the same. Some foods have fewer nutrients per serving than other foods, even though they might have the same number of calories or carbohydrates. It is difficult to get your body what it needs when you eat foods with fewer nutrients. Examples of foods that you should avoid that are high in calories and carbohydrates but low in nutrients include:  Trans fats (most processed foods list trans fats on the Nutrition Facts label).  Regular soda.  Juice.  Candy.  Sweets, such as cake, pie, doughnuts, and cookies.  Fried foods. What foods can I eat? Eat nutrient-rich foods, which will nourish your body and keep you healthy. The food you should eat also will depend on several factors, including:  The calories you need.  The medicines you take.  Your weight.  Your blood glucose level.  Your blood pressure level.  Your cholesterol level. You should eat a variety of foods, including:  Protein.  Lean cuts of meat.  Proteins low in saturated fats, such as fish, egg whites, and beans. Avoid processed meats.  Fruits and vegetables.  Fruits and vegetables that may help control blood glucose levels, such as apples, mangoes,  and yams.  Dairy products.  Choose fat-free or low-fat dairy products, such as milk, yogurt, and cheese.  Grains, bread, pasta, and rice.  Choose whole grain products, such as multigrain bread, whole oats, and brown rice. These foods may help control blood pressure.  Fats.  Foods containing healthful fats, such as nuts, avocado, olive oil, canola oil, and fish. Does everyone with diabetes mellitus have the same meal plan? Because every person with diabetes mellitus is different, there is not one meal plan that works for everyone. It is very important that you meet with a dietitian who will help you create a meal plan that is just right for you. This information is not intended to replace advice given to you by your health care provider. Make sure you discuss any questions you have with your health care provider. Document Released: 08/20/2005 Document Revised: 04/30/2016 Document Reviewed: 10/20/2013 Elsevier Interactive Patient Education  2017 [employer].

## 2016-11-04 NOTE — Progress Notes (Signed)
   CC: medication management of DM   HPI: [name] is a 60 y.o. with past medical history as outlined below who presents to clinic for follow up of medication management of diabetes, constipation, GERD, HTN, and arthritis.   Please see problem list for status of the pt's chronic medical problems.  Past Medical History:  Diagnosis Date  . Carpal tunnel syndrome   . Carpal tunnel syndrome, bilateral    confirmed on nerve conduction studies  . Diabetes mellitus type 2, uncontrolled ([place])   . Diabetic peripheral neuropathy ([place])   . GERD (gastroesophageal reflux disease)   . Heat edema   . History of chest pain    negative cardiolite September 2007.  ETT-myoview 12/10 with EF 76%, no ischemia,   . History of endometriosis   . History of Helicobacter infection 11/07  . Hyperlipidemia    Review of Systems:  Please see each problem below for a pertinent review of systems. ROS  Physical Exam:  Vitals:   11/04/16 1342  BP: 128/80  Pulse: 80  Temp: 97.7 F (36.5 C)  TempSrc: Oral  SpO2: 100%  Weight: 177 lb 3.2 oz (80.4 kg)  Height: 4\' 10"  (1.473 m)   Physical Exam  Constitutional: She is oriented to person, place, and time. She appears well-developed and well-nourished. No distress.  Eyes: Conjunctivae are normal. No scleral icterus.  Cardiovascular: Normal rate and regular rhythm.   No murmur heard. Pulmonary/Chest: Effort normal. She has no wheezes. She has no rales.  Abdominal: Soft. Bowel sounds are normal. She exhibits no distension. There is no tenderness.  Neurological: She is alert and oriented to person, place, and time.  Skin: She is not diaphoretic.  Feet have no skin changes or lacerations     Assessment & Plan:   See Encounters Tab for problem based charting.  Patient seen with Dr. Dareen Piano

## 2016-11-05 LAB — MICROALBUMIN / CREATININE URINE RATIO
Creatinine, Urine: 146.7 mg/dL
MICROALBUM., U, RANDOM: 1896.6 ug/mL
Microalb/Creat Ratio: 1292.8 mg/g creat — ABNORMAL HIGH (ref 0.0–30.0)

## 2016-11-05 LAB — BMP8+ANION GAP
ANION GAP: 21 mmol/L — AB (ref 10.0–18.0)
BUN/Creatinine Ratio: 16 (ref 9–23)
BUN: 18 mg/dL (ref 6–24)
CALCIUM: 9 mg/dL (ref 8.7–10.2)
CO2: 22 mmol/L (ref 18–29)
CREATININE: 1.12 mg/dL — AB (ref 0.57–1.00)
Chloride: 98 mmol/L (ref 96–106)
GFR calc Af Amer: 62 mL/min/{1.73_m2} (ref >59)
GFR, EST NON AFRICAN AMERICAN: 54 mL/min/{1.73_m2} — AB (ref >59)
Glucose: 79 mg/dL (ref 65–99)
POTASSIUM: 3.7 mmol/L (ref 3.5–5.2)
Sodium: 141 mmol/L (ref 134–144)

## 2016-11-06 DIAGNOSIS — E1121 Type 2 diabetes mellitus with diabetic nephropathy: Secondary | ICD-10-CM | POA: Insufficient documentation

## 2016-11-06 MED ORDER — INSULIN GLARGINE 100 UNIT/ML ~~LOC~~ SOLN: SUBCUTANEOUS | 3 refills | Status: DC

## 2016-11-06 MED ORDER — METFORMIN HCL 1000 MG PO TABS: 1000.0000 mg | ORAL_TABLET | Freq: Two times a day (BID) | ORAL | 3 refills | Status: DC

## 2016-11-06 MED ORDER — OMEPRAZOLE 20 MG PO CPDR: DELAYED_RELEASE_CAPSULE | ORAL | 2 refills | Status: DC

## 2016-11-06 MED ORDER — HYDROCHLOROTHIAZIDE 25 MG PO TABS: ORAL_TABLET | ORAL | 3 refills | Status: DC

## 2016-11-06 MED ORDER — LIRAGLUTIDE 18 MG/3ML ~~LOC~~ SOPN: PEN_INJECTOR | SUBCUTANEOUS | 3 refills | Status: DC

## 2016-11-06 MED ORDER — ACCU-CHEK FASTCLIX LANCETS MISC: 3 refills | Status: DC

## 2016-11-06 MED ORDER — ATORVASTATIN CALCIUM 40 MG PO TABS: 40.0000 mg | ORAL_TABLET | Freq: Every day | ORAL | 3 refills | Status: DC

## 2016-11-06 MED ORDER — LOSARTAN POTASSIUM 100 MG PO TABS: 100.0000 mg | ORAL_TABLET | Freq: Every day | ORAL | 3 refills | Status: DC

## 2016-11-06 MED ORDER — DICLOFENAC SODIUM 1 % TD GEL: 4.0000 g | Freq: Four times a day (QID) | TRANSDERMAL | 12 refills | Status: DC

## 2016-11-06 MED ORDER — INSULIN PEN NEEDLE 31G X 8 MM MISC: 3 refills | Status: DC

## 2016-11-06 MED ORDER — GLUCOSE BLOOD VI STRP: ORAL_STRIP | 3 refills | Status: DC

## 2016-11-06 MED ORDER — SENNA 8.6 MG PO TABS: 2.0000 | ORAL_TABLET | Freq: Every day | ORAL | 3 refills | Status: DC | PRN

## 2016-11-06 NOTE — Assessment & Plan Note (Addendum)
BP Readings from Last 3 Encounters:  11/04/16 128/80  09/18/16 (!) 133/93  09/08/16 115/76  BP well controlled on current regiment.   Refilled Losartan 100 mg daily  Refilled HCTZ 25 mg daily    Addendum: She has persistent proteinuria and may benefit from the addition of CCB to the ARB. Will prescribe verapamil XR 120 mg tablets and decrease to HCTZ 12.5 mg. She should return to clinic for BP check in 1 month.

## 2016-11-06 NOTE — Assessment & Plan Note (Signed)
Constipation has been well controlled with senna and stool softener as needed   Refilled senna

## 2016-11-06 NOTE — Assessment & Plan Note (Signed)
Pain is stable, has been well controlled with voltaren gel.   Refilled voltaren gel

## 2016-11-06 NOTE — Assessment & Plan Note (Addendum)
Urine Microalbumin/creatinine ratio 12/2014 1,216 repeat today shows persistence in this elevation. This is a diagnosis of diabetic nephropathy. She is on Losartan 100 mg daily to slow this progression but may benefit from the addition of CCB therapy.   Continue to monitor blood pressure goal and glycemic control  Refilled Losartan 100 mg daily  Prescribed Verapamil XR 120 mg daily

## 2016-11-06 NOTE — Assessment & Plan Note (Signed)
She is no longer taking meloxicam but continues to experience heart burn despite  taking omeprazole daily. She believes that this is related to her eating too fast. Denies cough, weight loss, or night sweats.   Referral to GI  Refilled omeprazole 20 m daily

## 2016-11-06 NOTE — Assessment & Plan Note (Addendum)
Denies symptoms of hypoglycemia. Did not bring her meter today but states sugar is usually 110-120s, she has seen one low reading of 65 which was asymptomatic, and has had two readings 200-300s.  Lab Results  Component Value Date   HGBA1C 6.3 11/04/2016  Urine Microalbumin/creatinine ratio 12/2014 1,216 repeat today shows persistence in this elevation. This is a diagnosis of diabetic nephropathy. She is on Losartan 100 mg daily to slow this progression.  Foot exam - denies burning, tingling, decreased sensation. No skin changes or lacerations on foot exam.  Opthalmology exam 04/2016 no retinopathy   Refilled Lantus 58 units q AM  Refilled metformin 1000 mg BID  Refilled liraglutide (victoza) 1.2 mg daily  Ordered HbA1c today  Ordered Urine microalbumin today  Refilled atorvastatin 40 mg daily

## 2016-11-08 NOTE — Progress Notes (Signed)
Internal Medicine Clinic Attending  I saw and evaluated the patient.  I personally confirmed the key portions of the history and exam documented by Dr. Blum and I reviewed pertinent patient test results.  The assessment, diagnosis, and plan were formulated together and I agree with the documentation in the resident's note. 

## 2016-11-09 ENCOUNTER — Other Ambulatory Visit: Payer: Self-pay | Admitting: Internal Medicine

## 2016-11-09 DIAGNOSIS — Z760 Encounter for issue of repeat prescription: Secondary | ICD-10-CM

## 2016-11-10 ENCOUNTER — Telehealth: Payer: Self-pay

## 2016-11-10 NOTE — Telephone Encounter (Signed)
Called pt, informed her all meds had refills available at [facility], she will call them

## 2016-11-10 NOTE — Telephone Encounter (Signed)
Requesting Lantus, Victoza and Losartan to be filled @ [facility].

## 2016-11-10 NOTE — Telephone Encounter (Signed)
These have been done, calling ht to check

## 2016-11-13 ENCOUNTER — Telehealth: Payer: Self-pay | Admitting: *Deleted

## 2016-11-13 MED ORDER — VERAPAMIL HCL ER 120 MG PO TBCR: 120.0000 mg | EXTENDED_RELEASE_TABLET | Freq: Every day | ORAL | 11 refills | Status: DC

## 2016-11-13 MED ORDER — HYDROCHLOROTHIAZIDE 12.5 MG PO TABS: ORAL_TABLET | ORAL | 3 refills | Status: DC

## 2016-11-13 NOTE — Progress Notes (Signed)
Verapamil rx called to [place].

## 2016-11-13 NOTE — Telephone Encounter (Signed)
-----   Message from Aldine Contes, MD sent at 11/11/2016 11:17 AM EST ----- Allene Pyo, She is still having a large amount of proteinuria despite her ARB. I would consider adding verapamil to help decrease her proteinuria.  Thanks, Dr. Dareen Piano ----- Message ----- From: Ledell Noss, MD Sent: 11/11/2016  12:32 AM To: Aldine Contes, MD  Hello Dr. Dareen Piano! We saw Ms. Serpe for follow up of her DM last week. Her HbA1c was improved but her kidney function is worsening. She is on losartan 100 mg daily. Do you think there is anything else we can do for her? Thank you!    ----- Message ----- From: Interface, Lab In [place] Sent: 11/04/2016   1:47 PM To: Ledell Noss, MD

## 2016-11-13 NOTE — Telephone Encounter (Signed)
Pt has had persistent elevation in her urine protein despite ARB therapy and may benefit from the addition of non-dihydropyridine CCB therapy. I have prescribed Verapamil ([place] SR) 120 mg daily and decreased her dose of HCTZ to 12.5 mg daily (she was on 25 mg). Will tell her to call the clinic if she experiences lightheadedness and have her return to clinic in 1 month for blood pressure check.

## 2016-11-13 NOTE — Telephone Encounter (Addendum)
Pt states [facility] will not have Verapamil available until Monday. Wants to know what to do - suggested going to a different pharmacy.  Also talked to Mannie Stabile, pharmacist, who stated she can go to [place] and agree to call and talk to pt.

## 2016-11-13 NOTE — Addendum Note (Signed)
Addended by: Meryl Dare on: 11/13/2016 02:44 AM   Modules accepted: Orders

## 2016-12-14 ENCOUNTER — Ambulatory Visit (INDEPENDENT_AMBULATORY_CARE_PROVIDER_SITE_OTHER): Payer: Medicare Other | Admitting: Internal Medicine

## 2016-12-14 ENCOUNTER — Encounter (INDEPENDENT_AMBULATORY_CARE_PROVIDER_SITE_OTHER): Payer: Self-pay

## 2016-12-14 VITALS — BP 144/77 | HR 77 | Temp 98.0°F | Ht <= 58 in | Wt 179.3 lb

## 2016-12-14 DIAGNOSIS — Z760 Encounter for issue of repeat prescription: Secondary | ICD-10-CM

## 2016-12-14 DIAGNOSIS — Z79899 Other long term (current) drug therapy: Secondary | ICD-10-CM | POA: Diagnosis not present

## 2016-12-14 DIAGNOSIS — I10 Essential (primary) hypertension: Secondary | ICD-10-CM | POA: Diagnosis not present

## 2016-12-14 DIAGNOSIS — E1121 Type 2 diabetes mellitus with diabetic nephropathy: Secondary | ICD-10-CM | POA: Diagnosis not present

## 2016-12-14 DIAGNOSIS — R809 Proteinuria, unspecified: Secondary | ICD-10-CM | POA: Diagnosis not present

## 2016-12-14 MED ORDER — HYDROCHLOROTHIAZIDE 25 MG PO TABS: ORAL_TABLET | ORAL | 0 refills | Status: DC

## 2016-12-14 NOTE — Progress Notes (Signed)
   CC: Follow up for hypertension and diabetic nephropathy  HPI: [name] is a 61 y.o. woman with a history of hypertension, type 2 diabetes complicated by peripheral neuropathy, GERD, hepatitis C status post successful treatment last year who is here today for management of her hypertension and diabetes. She was seen November last year where she was noted to have elevated urine protein demonstrating diabetic nephropathy. She started on verapamil 120 mg XR for this and HCTZ was decreased to 12.5 mg. In the past month she has noticed some lightheadedness with postural changes giving example of an standing up after tying shoelaces. Otherwise she denies dyspnea on exertion or any gait unsteadiness.   See problem based assessment and plan below for additional details  Past Medical History:  Diagnosis Date  . Carpal tunnel syndrome   . Carpal tunnel syndrome, bilateral    confirmed on nerve conduction studies  . Diabetes mellitus type 2, uncontrolled ([place])   . Diabetic peripheral neuropathy ([place])   . GERD (gastroesophageal reflux disease)   . Heat edema   . History of chest pain    negative cardiolite September 2007.  ETT-myoview 12/10 with EF 76%, no ischemia,   . History of endometriosis   . History of Helicobacter infection 11/07  . Hyperlipidemia     Review of Systems:  Review of Systems  Constitutional: Negative for malaise/fatigue.  Eyes: Negative for blurred vision.  Respiratory: Negative for shortness of breath.   Cardiovascular: Negative for chest pain.  Genitourinary: Negative for frequency.  Musculoskeletal: Negative for falls.  Neurological: Positive for dizziness. Negative for loss of consciousness and headaches.    Physical Exam: Physical Exam  Constitutional: She is oriented to person, place, and time and well-developed, well-nourished, and in no distress. No distress.  HENT:  Head: Normocephalic and atraumatic.  Mouth/Throat: Oropharynx is clear and  moist.  Eyes: Conjunctivae are normal.  Cardiovascular: Normal rate and regular rhythm.   Pulmonary/Chest: Effort normal and breath sounds normal.  Abdominal: Soft. There is no tenderness.  Musculoskeletal: She exhibits no edema.  Neurological: She is alert and oriented to person, place, and time.  Skin: Skin is warm and dry.    Vitals:   12/14/16 1029  BP: (!) 144/77  Pulse: 77  Temp: 98 F (36.7 C)  TempSrc: Oral  SpO2: 100%  Weight: 179 lb 4.8 oz (81.3 kg)  Height: 4\' 10"  (1.473 m)    Assessment & Plan:   See Encounters Tab for problem based charting.  Patient discussed with Dr. Angelia Mould

## 2016-12-14 NOTE — Patient Instructions (Signed)
It was a pleasure to see today Ms. Mcalpine.  We're rechecking your urine today to see if the protein is improved with starting the verapamil. I suspect your brief episodes of lightheadedness are related to your new blood pressure medicine. For right now I recommend trying to double your hydrochlorothiazide back to 25 mg daily and see if your symptoms worsen or not. If you do not have more lightheadedness with this change in prescription usually continue it for better blood pressure control. I will change your prescription assuming that you're able to tolerate this change in dose. Please let us know if you have trouble with it.

## 2016-12-15 ENCOUNTER — Other Ambulatory Visit: Payer: Self-pay

## 2016-12-15 DIAGNOSIS — Z760 Encounter for issue of repeat prescription: Secondary | ICD-10-CM

## 2016-12-15 LAB — MICROALBUMIN / CREATININE URINE RATIO
Creatinine, Urine: 91.9 mg/dL
MICROALB/CREAT RATIO: 1929.3 mg/g{creat} — AB (ref 0.0–30.0)
Microalbumin, Urine: 1773 ug/mL

## 2016-12-15 MED ORDER — GLUCOSE BLOOD VI STRP: ORAL_STRIP | 3 refills | Status: DC

## 2016-12-15 MED ORDER — INSULIN SYRINGES (DISPOSABLE) U-100 1 ML MISC: 2.0000 | Freq: Every day | 3 refills | Status: DC

## 2016-12-15 NOTE — Telephone Encounter (Signed)
Requesting test strips and syringes to be filled @ [facility]

## 2016-12-16 NOTE — Progress Notes (Signed)
Internal Medicine Clinic Attending  Case discussed with Dr. Rice at the time of the visit.  We reviewed the resident's history and exam and pertinent patient test results.  I agree with the assessment, diagnosis, and plan of care documented in the resident's note.  

## 2016-12-16 NOTE — Assessment & Plan Note (Signed)
Her proteinuria is not improved today despite starting verapamil. Her blood pressure however is also less controlled so this would drive hyperfiltration as well. Her diabetes appears to be well controlled so I don't think this should be driving the process as much right now.  Continue close follow up for blood pressure control

## 2016-12-16 NOTE — Assessment & Plan Note (Signed)
A: She is mildly hypertensive today at 144/77. She reports some mild symptomatic dizziness with postural changes concerning for hypotension. Orthostatic vital signs were checked and normal. Given her proteinuria tight control would be a major benefit but we may become limited by symptoms.  P: -Continued verapamil 120mg  -Coninue losartan 100mg  -Increased HCTZ from 12.5mg  to 25mg  -Advised on trying slower position changes if symptomatic -Compression hose would be another option of symptoms severely limit BP control

## 2017-01-03 ENCOUNTER — Encounter (HOSPITAL_COMMUNITY): Payer: Self-pay | Admitting: Emergency Medicine

## 2017-01-03 ENCOUNTER — Emergency Department (HOSPITAL_COMMUNITY)
Admission: EM | Admit: 2017-01-03 | Discharge: 2017-01-03 | Disposition: A | Discharge status: Home / Self Care | Payer: Medicare Other | Attending: Emergency Medicine | Admitting: Emergency Medicine

## 2017-01-03 ENCOUNTER — Emergency Department (HOSPITAL_COMMUNITY): Payer: Medicare Other

## 2017-01-03 DIAGNOSIS — Z794 Long term (current) use of insulin: Secondary | ICD-10-CM | POA: Diagnosis not present

## 2017-01-03 DIAGNOSIS — I10 Essential (primary) hypertension: Secondary | ICD-10-CM | POA: Insufficient documentation

## 2017-01-03 DIAGNOSIS — S6991XA Unspecified injury of right wrist, hand and finger(s), initial encounter: Secondary | ICD-10-CM

## 2017-01-03 DIAGNOSIS — S6701XA Crushing injury of right thumb, initial encounter: Secondary | ICD-10-CM | POA: Insufficient documentation

## 2017-01-03 DIAGNOSIS — Y999 Unspecified external cause status: Secondary | ICD-10-CM | POA: Diagnosis not present

## 2017-01-03 DIAGNOSIS — Y929 Unspecified place or not applicable: Secondary | ICD-10-CM | POA: Insufficient documentation

## 2017-01-03 DIAGNOSIS — M79644 Pain in right finger(s): Secondary | ICD-10-CM | POA: Diagnosis not present

## 2017-01-03 DIAGNOSIS — T148XXA Other injury of unspecified body region, initial encounter: Secondary | ICD-10-CM

## 2017-01-03 DIAGNOSIS — S61011A Laceration without foreign body of right thumb without damage to nail, initial encounter: Secondary | ICD-10-CM | POA: Diagnosis not present

## 2017-01-03 DIAGNOSIS — Z79899 Other long term (current) drug therapy: Secondary | ICD-10-CM | POA: Insufficient documentation

## 2017-01-03 DIAGNOSIS — Z23 Encounter for immunization: Secondary | ICD-10-CM | POA: Insufficient documentation

## 2017-01-03 DIAGNOSIS — E1140 Type 2 diabetes mellitus with diabetic neuropathy, unspecified: Secondary | ICD-10-CM | POA: Insufficient documentation

## 2017-01-03 DIAGNOSIS — W230XXA Caught, crushed, jammed, or pinched between moving objects, initial encounter: Secondary | ICD-10-CM | POA: Insufficient documentation

## 2017-01-03 DIAGNOSIS — Y9389 Activity, other specified: Secondary | ICD-10-CM | POA: Diagnosis not present

## 2017-01-03 DIAGNOSIS — Z7982 Long term (current) use of aspirin: Secondary | ICD-10-CM | POA: Insufficient documentation

## 2017-01-03 MED ORDER — TETANUS-DIPHTH-ACELL PERTUSSIS 5-2.5-18.5 LF-MCG/0.5 IM SUSP
0.5000 mL | Freq: Once | INTRAMUSCULAR | Status: AC
  Administered 2017-01-03: 0.5 mL via INTRAMUSCULAR
  Filled 2017-01-03: qty 0.5

## 2017-01-03 MED ORDER — HYDROCODONE-ACETAMINOPHEN 5-325 MG PO TABS
1.0000 | ORAL_TABLET | Freq: Once | ORAL | Status: AC
  Administered 2017-01-03: 1 via ORAL
  Filled 2017-01-03: qty 1

## 2017-01-03 MED ORDER — CEPHALEXIN 500 MG PO CAPS
500.0000 mg | ORAL_CAPSULE | Freq: Two times a day (BID) | ORAL | 0 refills | Status: DC
Start: 2017-01-03 — End: 2017-01-19

## 2017-01-03 NOTE — ED Triage Notes (Addendum)
Pt shut her finger in the window.  [place] to the right thumb, part of nail is missing.  Bleeding is controled at this time.

## 2017-01-03 NOTE — ED Notes (Signed)
Patient transported to X-ray 

## 2017-01-03 NOTE — ED Notes (Signed)
Pt and family understood dc material. NAD noted. Scripts given at dc 

## 2017-01-03 NOTE — ED Provider Notes (Signed)
[facility] DEPT Provider Note   CSN: [id number] Arrival date & time: 01/03/17  2011   By signing my name below, I, Ryan Long, attest that this documentation has been prepared under the direction and in the presence of [facility] M. Janit Bern, NP Electronically Signed: Vergia Alcon, Scribe. 01/03/2017. 9:27 PM.   History   Chief Complaint Chief Complaint  Patient presents with  . Finger Injury    The history is provided by the patient and the spouse. No language interpreter was used.  Hand Pain  This is a new problem. The current episode started 1 to 2 hours ago. The problem occurs constantly. The problem has not changed since onset.The symptoms are aggravated by bending. Nothing relieves the symptoms. She has tried nothing for the symptoms. The treatment provided no relief.    HPI Comments:  Nancy [name] is a 61 y.o. female with a PMHx of DM who presents to the Emergency Department complaining of constant right thumb pain s/p shutting it in a window PTA. She reports trying to close the window after making dinner and it crushing her right posterior thumb nail bed. Bleeding is controlled at this time. Pt states her pain is worsened with thumb movement. Pt denies taking OTC medications at home to improve symptoms. She denies additional injuries. Tdap out of date.   Past Medical History:  Diagnosis Date  . Carpal tunnel syndrome   . Carpal tunnel syndrome, bilateral    confirmed on nerve conduction studies  . Diabetes mellitus type 2, uncontrolled ([place])   . Diabetic peripheral neuropathy ([place])   . GERD (gastroesophageal reflux disease)   . Heat edema   . History of chest pain    negative cardiolite September 2007.  ETT-myoview 12/10 with EF 76%, no ischemia,   . History of endometriosis   . History of Helicobacter infection 11/07  . Hyperlipidemia     Patient Active Problem List   Diagnosis Date Noted  . Diabetic nephropathy ([place]) 11/06/2016  . Other constipation 09/08/2016  .  Goals of care, counseling/discussion 04/24/2016  . Liver fibrosis ([place]) 02/17/2016  . Chronic arthritis associated with viral hepatitis ([place]) 11/21/2015  . Chronic hepatitis C without hepatic coma ([place]) 09/12/2015  . Health care maintenance 10/03/2014  . GERD (gastroesophageal reflux disease) 01/16/2014  . Tubular adenoma of colon 07/31/2011  . Carpal tunnel syndrome, bilateral   . Essential hypertension 11/09/2006  . Diabetes ([place]) 12/07/2002    Past Surgical History:  Procedure Laterality Date  . ABDOMINAL HYSTERECTOMY    . CARPAL TUNNEL RELEASE      OB History    No data available       Home Medications    Prior to Admission medications   Medication Sig Start Date End Date Taking? Authorizing Provider  ACCU-CHEK FASTCLIX LANCETS MISC Use to check blood sugar up to 3 times a day DX code 250.02 insulin requiring 11/06/16   Ledell Noss, MD  aspirin EC 81 MG tablet Take 1 tablet (81 mg total) by mouth daily. 09/08/16   Zada Finders, MD  atorvastatin (LIPITOR) 40 MG tablet Take 1 tablet (40 mg total) by mouth daily. 11/06/16 11/06/17  Ledell Noss, MD  cephALEXin (KEFLEX) 500 MG capsule Take 1 capsule (500 mg total) by mouth 2 (two) times daily. 01/03/17   Jamin Panther Bunnie Pion, NP  diclofenac sodium (VOLTAREN) 1 % GEL Apply 4 g topically 4 (four) times daily. 11/06/16   Ledell Noss, MD  glucose blood (ACCU-CHEK SMARTVIEW) test strip USE TO CHECK  BLOOD SUGAR 3 to 4 times daily. diag code E11.9. Insulin dependent 12/15/16   Ledell Noss, MD  hydrochlorothiazide (HYDRODIURIL) 25 MG tablet TAKE 1 TABLET (25 MG TOTAL) BY MOUTH DAILY. 12/14/16   Collier Salina, MD  insulin glargine (LANTUS) 100 UNIT/ML injection INJECT 58 UNITS INTO THE SKIN EVERY MORNING WHEN YOU WAKE UP 11/06/16   Ledell Noss, MD  Insulin Pen Needle (CAREFINE PEN NEEDLES) 31G X 8 MM MISC Please use with liraglutide. Use 0.6 mg once daily for a week. Thereafter, you will take 1.2 mg daily. 11/06/16   Ledell Noss, MD  Insulin Syringes, Disposable,  U-100 1 ML MISC 2 Syringes by Does not apply route daily. Use to inject insulin into the skin 1 time daily. diag code E11.9. Insulin dependent 12/15/16   Ledell Noss, MD  liraglutide 18 MG/3ML SOPN Inject 0.1 mL (0.6 mg) into the skin daily for the first week. Thereafter, inject 0.2 mL (1.2 mg) into the skin. 11/06/16   Ledell Noss, MD  losartan (COZAAR) 100 MG tablet TAKE ONE TABLET BY MOUTH DAILY BY MOUTH 11/09/16   Ledell Noss, MD  metFORMIN (GLUCOPHAGE) 1000 MG tablet Take 1 tablet (1,000 mg total) by mouth 2 (two) times daily with a meal. Take with breakfast 11/06/16 11/06/17  Ledell Noss, MD  omeprazole (PRILOSEC) 20 MG capsule TAKE 1 CAPSULE (20 MG TOTAL) BY MOUTH DAILY. 11/06/16   Ledell Noss, MD  senna (SENOKOT) 8.6 MG TABS tablet Take 2 tablets (17.2 mg total) by mouth daily as needed for mild constipation. 11/06/16   Ledell Noss, MD  verapamil (CALAN-SR) 120 MG CR tablet Take 1 tablet (120 mg total) by mouth daily. 11/13/16 11/13/17  Ledell Noss, MD    Family History Family History  Problem Relation Age of Onset  . Diabetes Mother   . Lung cancer Maternal Uncle   . Breast cancer Maternal Grandmother   . Liver cancer Maternal Grandfather   . Heart disease Brother   . Colon cancer Neg Hx     Social History Social History  Substance Use Topics  . Smoking status: Never Smoker  . Smokeless tobacco: Never Used  . Alcohol use No     Allergies   Ace inhibitors; Amitriptyline hcl; and Prednisone   Review of Systems Review of Systems  Musculoskeletal: Positive for myalgias.       Right thumb pain  Skin: Positive for wound.     Physical Exam Updated Vital Signs BP 117/79   Pulse 74   Temp 98.1 F (36.7 C) (Oral)   Resp 17   Ht 4\' 10"  (1.473 m)   Wt 79.4 kg   LMP 02/15/2009   SpO2 100%   BMI 36.58 kg/m   Physical Exam  Constitutional: She is oriented to person, place, and time. She appears well-developed and well-nourished.  HENT:  Head: Normocephalic.  Eyes: EOM are normal.  Neck:  Neck supple.  Cardiovascular: Normal rate.   Pulmonary/Chest: Effort normal.  Musculoskeletal: Normal range of motion.       Right hand: She exhibits tenderness and laceration. She exhibits normal range of motion. Normal sensation noted. Normal strength noted.       Hands: Patient with blood blister to the tip of the right thumb. Patient has artificial nails and the distal aspect of the nail is broken. The natural nail is exposed and partially broken.   Neurological: She is alert and oriented to person, place, and time.  Skin: Skin is warm and dry.  Psychiatric:  She has a normal mood and affect.  Nursing note and vitals reviewed.    ED Treatments / Results  DIAGNOSTIC STUDIES:  Oxygen Saturation is 99% on RA, normal by my interpretation.    COORDINATION OF CARE:  9:11 PM Discussed treatment plan with pt at bedside including XR of right hand and pt agreed to plan.   Radiology Dg Finger Thumb Right  Result Date: 01/03/2017 CLINICAL DATA:  Right thumb pain following crush injury. Initial encounter. EXAM: RIGHT THUMB 2+V COMPARISON:  None. FINDINGS: There is no evidence of fracture or dislocation. There is no evidence of arthropathy or other focal bone abnormality. Soft tissues are unremarkable IMPRESSION: Negative. Electronically Signed   By: Margarette Canada M.D.   On: 01/03/2017 21:46    Procedures .Marland KitchenLaceration Repair Date/Time: 01/03/2017 10:42 PM Performed by: Ashley Murrain Authorized by: Ashley Murrain   Consent:    Consent obtained:  Verbal   Consent given by:  Patient   Risks discussed:  Infection, pain, poor cosmetic result and poor wound healing Universal protocol:    Procedure explained and questions answered to patient or proxy's satisfaction: yes     Relevant documents present and verified: yes   Anesthesia (see MAR for exact dosages):    Anesthesia method:  None Laceration details:    Location:  Finger   Finger location:  R thumb   Length (cm):  1 Exploration:     Contaminated: no   Treatment:    Area cleansed with:  Saline   Amount of cleaning:  Standard   Irrigation solution:  Sterile saline   Irrigation method:  Syringe   Visualized foreign bodies/material removed: no   Post-procedure details:    Dressing:  Bulky dressing   Patient tolerance of procedure:  Tolerated well, no immediate complications Comments:     The wound did not require sutures. After cleaning the wound I applied xeroform gauze, Telfa and a sterile dressing.  Tetanus updated.       Medications Ordered in ED Medications  Tdap (BOOSTRIX) injection 0.5 mL (0.5 mLs Intramuscular Given 01/03/17 2119)  HYDROcodone-acetaminophen (NORCO/VICODIN) 5-325 MG per tablet 1 tablet (1 tablet Oral Given 01/03/17 2119)     Initial Impression / Assessment and Plan / ED Course  I have reviewed the triage vital signs and the nursing notes.  Pertinent labs & imaging results that were available during my care of the patient were reviewed by me and considered in my medical decision making (see chart for details).     Patient X-Ray negative for obvious fracture or dislocation. There is no evidence of arthropathy or other focal bone abnormality. Soft tissues are unremarkable  Pt advised to follow up with orthopedics. Patient treated with dressing while in ED, conservative therapy recommended and discussed. Patient will be discharged home with cephalexin & is agreeable with above plan. Returns precautions discussed. Pt appears safe for discharge.   Final Clinical Impressions(s) / ED Diagnoses   Final diagnoses:  Crush injury  Injury of tip of finger of right hand, initial encounter    New Prescriptions Discharge Medication List as of 01/03/2017 10:41 PM    START taking these medications   Details  cephALEXin (KEFLEX) 500 MG capsule Take 1 capsule (500 mg total) by mouth 2 (two) times daily., Starting Sun 01/03/2017, Print       I personally performed the services described in this  documentation, which was scribed in my presence. The recorded information has been reviewed and is accurate.  [address] [place], [place] 01/04/17 [employer], MD 01/04/17 [phone]

## 2017-01-03 NOTE — ED Notes (Signed)
Sign application is not working. Pt unable to sign at dc

## 2017-01-19 ENCOUNTER — Ambulatory Visit (INDEPENDENT_AMBULATORY_CARE_PROVIDER_SITE_OTHER): Payer: Medicare Other | Admitting: Gastroenterology

## 2017-01-19 ENCOUNTER — Encounter: Payer: Self-pay | Admitting: Gastroenterology

## 2017-01-19 ENCOUNTER — Encounter (INDEPENDENT_AMBULATORY_CARE_PROVIDER_SITE_OTHER): Payer: Self-pay

## 2017-01-19 VITALS — BP 126/78 | HR 80 | Ht 58.25 in | Wt 176.1 lb

## 2017-01-19 DIAGNOSIS — R1310 Dysphagia, unspecified: Secondary | ICD-10-CM

## 2017-01-19 DIAGNOSIS — K219 Gastro-esophageal reflux disease without esophagitis: Secondary | ICD-10-CM | POA: Diagnosis not present

## 2017-01-19 MED ORDER — OMEPRAZOLE 40 MG PO CPDR: 40.0000 mg | DELAYED_RELEASE_CAPSULE | Freq: Every day | ORAL | 3 refills | Status: DC

## 2017-01-19 NOTE — Patient Instructions (Signed)
Increase omeprazole/prilosec to 40mg  once daily dose.  Best to take 20-30 min before a meal.  Prescribe 30 days with 11 refills. Start ranitidine 150mg  pill, take one pill at bedtime every night. This is OTC.  You will be set up for an upper endoscopy for GERD, dysphagia.

## 2017-01-19 NOTE — Progress Notes (Signed)
Review of pertinent gastrointestinal problems:  1. Adenomatous polyp, 12/2008 colonoscopy done for fob+ stool, recall set for 5 year interval; Colonoscopy Dr. Ardis Hughs 03/2014 was normal; recommended recall at 10 year interval. 2. Mucous discharge, abd pain "cloth like" substance coming out of her bottom. She submitted a specimen, pathology said it was a "strand of mucus". Flex sig Dr. Ardis Hughs 2012 was normal.  HPI: This is a very pleasant 61 year old woman  who was referred to me by Ledell Noss, MD  to evaluate  dysphasia, GERD .    Chief complaint is dysphagia, GERD   Dysphagia for many many years.  This is a problem every times she eats.  Only to solid foods, pills.  She will try to drink water to push it down, and that usually helps.  Never has had to vomit.    Has pyrosis, takes prilosec OTC daily.  This seems to affect her all day, seems to be worse at night as well. Her Prilosec is 20 mg and I'm not sure she is taking it at the correct time and relation to meals.  No fevers or chills, no significant abdominal pains  Weight has gone down about 10 pounds in 6 months.  Review of systems: Pertinent positive and negative review of systems were noted in the above HPI section. Complete review of systems was performed and was otherwise normal.   Past Medical History:  Diagnosis Date  . Carpal tunnel syndrome   . Carpal tunnel syndrome, bilateral    confirmed on nerve conduction studies  . Diabetes mellitus type 2, uncontrolled ([place])   . Diabetic peripheral neuropathy ([place])   . GERD (gastroesophageal reflux disease)   . Heat edema   . History of chest pain    negative cardiolite September 2007.  ETT-myoview 12/10 with EF 76%, no ischemia,   . History of endometriosis   . History of Helicobacter infection 11/07  . Hyperlipidemia     Past Surgical History:  Procedure Laterality Date  . ABDOMINAL HYSTERECTOMY    . CARPAL TUNNEL RELEASE      Current Outpatient Prescriptions   Medication Sig Dispense Refill  . ACCU-CHEK FASTCLIX LANCETS MISC Use to check blood sugar up to 3 times a day DX code 250.02 insulin requiring 306 each 3  . aspirin EC 81 MG tablet Take 1 tablet (81 mg total) by mouth daily. 90 tablet   . atorvastatin (LIPITOR) 40 MG tablet Take 1 tablet (40 mg total) by mouth daily. 90 tablet 3  . glucose blood (ACCU-CHEK SMARTVIEW) test strip USE TO CHECK BLOOD SUGAR 3 to 4 times daily. diag code E11.9. Insulin dependent 300 each 3  . hydrochlorothiazide (HYDRODIURIL) 25 MG tablet TAKE 1 TABLET (25 MG TOTAL) BY MOUTH DAILY. 90 tablet 0  . insulin glargine (LANTUS) 100 UNIT/ML injection INJECT 58 UNITS INTO THE SKIN EVERY MORNING WHEN YOU WAKE UP 6 vial 3  . Insulin Pen Needle (CAREFINE PEN NEEDLES) 31G X 8 MM MISC Please use with liraglutide. Use 0.6 mg once daily for a week. Thereafter, you will take 1.2 mg daily. 100 each 3  . Insulin Syringes, Disposable, U-100 1 ML MISC 2 Syringes by Does not apply route daily. Use to inject insulin into the skin 1 time daily. diag code E11.9. Insulin dependent 100 each 3  . liraglutide 18 MG/3ML SOPN Inject 0.1 mL (0.6 mg) into the skin daily for the first week. Thereafter, inject 0.2 mL (1.2 mg) into the skin. 15 pen 3  .  losartan (COZAAR) 100 MG tablet TAKE ONE TABLET BY MOUTH DAILY BY MOUTH 90 tablet 3  . metFORMIN (GLUCOPHAGE) 1000 MG tablet Take 1 tablet (1,000 mg total) by mouth 2 (two) times daily with a meal. Take with breakfast 180 tablet 3  . omeprazole (PRILOSEC) 20 MG capsule TAKE 1 CAPSULE (20 MG TOTAL) BY MOUTH DAILY. 90 capsule 2  . senna (SENOKOT) 8.6 MG TABS tablet Take 2 tablets (17.2 mg total) by mouth daily as needed for mild constipation. 180 each 3  . verapamil (CALAN-SR) 120 MG CR tablet Take 1 tablet (120 mg total) by mouth daily. 30 tablet 11   No current facility-administered medications for this visit.     Allergies as of 01/19/2017 - Review Complete 01/19/2017  Allergen Reaction Noted  . Ace  inhibitors Cough 05/11/2014  . Amitriptyline hcl Nausea Only 04/02/2014  . Prednisone Nausea And Vomiting 08/15/2015    Family History  Problem Relation Age of Onset  . Diabetes Mother   . Lung cancer Maternal Uncle   . Breast cancer Maternal Grandmother   . Liver cancer Maternal Grandfather   . Heart disease Brother   . Colon cancer Neg Hx     Social History   Social History  . Marital status: Single    Spouse name: N/A  . Number of children: 0  . Years of education: 76   Occupational History  . [employer]   Social History Main Topics  . Smoking status: Never Smoker  . Smokeless tobacco: Never Used  . Alcohol use No  . Drug use: No  . Sexual activity: Not on file   Other Topics Concern  . Not on file   Social History Narrative   Financial assistance approved for 100% discount at [facility] and has [facility] card per [employer]   09/26/2010     Physical Exam: BP 126/78 (BP Location: Left Arm, Patient Position: Sitting, Cuff Size: Normal)   Pulse 80   Ht 4' 10.25" (1.48 m) Comment: height measured without shoes  Wt 176 lb 2 oz (79.9 kg)   LMP 02/15/2009   BMI 36.49 kg/m  Constitutional: generally well-appearing, obese Psychiatric: alert and oriented x3 Eyes: extraocular movements intact Mouth: oral pharynx moist, no lesions Neck: supple no lymphadenopathy Cardiovascular: heart regular rate and rhythm Lungs: clear to auscultation bilaterally Abdomen: soft, nontender, nondistended, no obvious ascites, no peritoneal signs, normal bowel sounds Extremities: no lower extremity edema bilaterally Skin: no lesions on visible extremities   Assessment and plan: 61 y.o. female with  Chronic GERD, chronic intermittent solid food dysphagia  I do think her symptoms are probably related to GERD. I'm going to have her increase her omeprazole to prescription strength and she will start taking it shortly before a meal since that is the way it is designed to work most  effectively. She has a large component of nighttime GERD-like symptoms and I have instructed her to start ranitidine 150 mg at bedtime as well. I recommended upper endoscopy with possible dilation for her dysphasia as well.  Please see the "Patient Instructions" section for addition details about the plan.   Owens Loffler, MD [facility] 01/19/2017, 11:08 AM  Cc: Ledell Noss, MD

## 2017-01-21 ENCOUNTER — Encounter (INDEPENDENT_AMBULATORY_CARE_PROVIDER_SITE_OTHER): Payer: Self-pay

## 2017-01-21 ENCOUNTER — Ambulatory Visit (INDEPENDENT_AMBULATORY_CARE_PROVIDER_SITE_OTHER): Payer: Medicare Other | Admitting: Internal Medicine

## 2017-01-21 VITALS — BP 130/67 | HR 82 | Temp 98.1°F | Ht <= 58 in | Wt 176.3 lb

## 2017-01-21 DIAGNOSIS — I10 Essential (primary) hypertension: Secondary | ICD-10-CM | POA: Diagnosis not present

## 2017-01-21 DIAGNOSIS — Z79899 Other long term (current) drug therapy: Secondary | ICD-10-CM

## 2017-01-21 NOTE — Assessment & Plan Note (Signed)
BP Readings from Last 3 Encounters:  01/21/17 130/67  01/19/17 126/78  01/03/17 117/79   Lab Results  Component Value Date   NA 141 11/04/2016   K 3.7 11/04/2016   CREATININE 1.12 (H) 11/04/2016    Assessment: Blood pressure control:  At goal!  Plan: Medications: Losartan 100 mg, HCTZ 25 mg and Verapamil 120 mg.  Will continue with current regimen. Patient at goal for proteinuria.

## 2017-01-21 NOTE — Progress Notes (Signed)
Medicine attending: Medical history, presenting problems, physical findings, and medications, reviewed with resident physician Dr Bethany Molt on the day of the patient visit and I concur with her evaluation and management plan. 

## 2017-01-21 NOTE — Progress Notes (Signed)
   CC: follow-up of hypertension.   HPI:  [name] is a 61 y.o. F who presents to the acute care clinic for evaluation of hypertension. She was seen by her PCP in late 2017 at which time routine screening demonstrated proteinuria. Due to her intolerance of ACE-I/ARB, she was started on Verapamil 120 mg XR and her HCTZ was decreased to 12.5mg . At her 1 month follow up, she endorsed some dizziness with standing however her orthostats were normal. She was hypertensive during this visit and her HCTZ was increased to 25 mg. She presents today for follow-up. She denies any further episodes of dizziness. No complaints today other than feeling nervous for her EGD tomorrow.   Past Medical History:  Diagnosis Date  . Carpal tunnel syndrome   . Carpal tunnel syndrome, bilateral    confirmed on nerve conduction studies  . Diabetes mellitus type 2, uncontrolled ([place])   . Diabetic peripheral neuropathy ([place])   . GERD (gastroesophageal reflux disease)   . Heat edema   . History of chest pain    negative cardiolite September 2007.  ETT-myoview 12/10 with EF 76%, no ischemia,   . History of endometriosis   . History of Helicobacter infection 11/07  . Hyperlipidemia     Review of Systems: Review of Systems  Constitutional: Negative for chills, fever and malaise/fatigue.  HENT: Negative for sinus pain and sore throat.   Respiratory: Negative for cough, sputum production and shortness of breath.   Cardiovascular: Negative for chest pain, palpitations and leg swelling.  Gastrointestinal: Negative for abdominal pain, nausea and vomiting.  Neurological: Negative for dizziness and headaches.   Physical Exam: Physical Exam  Constitutional: She appears well-developed and well-nourished.  HENT:  Head: Normocephalic and atraumatic.  Cardiovascular: Normal rate, regular rhythm and normal heart sounds.   No murmur heard. Pulmonary/Chest: Effort normal and breath sounds normal. No respiratory  distress.  Abdominal: Soft. Bowel sounds are normal. She exhibits no distension. There is no tenderness.  Skin: Skin is warm and dry. She is not diaphoretic.  Psychiatric: She has a normal mood and affect. Her behavior is normal.   Vitals:   01/21/17 1033  BP: 130/67  Pulse: 82  Temp: 98.1 F (36.7 C)  TempSrc: Oral  SpO2: 100%  Weight: 176 lb 4.8 oz (80 kg)  Height: 4\' 10"  (1.473 m)   Assessment & Plan:   See Encounters Tab for problem based charting.  Patient discussed with Dr. Beryle Beams

## 2017-01-21 NOTE — Patient Instructions (Signed)
It was a pleasure meeting you today! I'm so glad you are feeling well and are going to have that procedure tomorrow. I hope you can find some relief. Today we talked about your blood pressure and your blood sugar! You are doing so well with both! I'm glad the lightheadedness has resolved. Please continue taking your medications as prescribed.

## 2017-01-22 ENCOUNTER — Ambulatory Visit (AMBULATORY_SURGERY_CENTER): Payer: Medicare Other | Admitting: Gastroenterology

## 2017-01-22 ENCOUNTER — Encounter: Payer: Self-pay | Admitting: Gastroenterology

## 2017-01-22 VITALS — BP 124/87 | HR 76 | Temp 98.2°F | Resp 21 | Ht <= 58 in | Wt 176.0 lb

## 2017-01-22 DIAGNOSIS — K219 Gastro-esophageal reflux disease without esophagitis: Secondary | ICD-10-CM

## 2017-01-22 DIAGNOSIS — R1319 Other dysphagia: Secondary | ICD-10-CM | POA: Diagnosis present

## 2017-01-22 MED ORDER — RANITIDINE HCL 150 MG PO TABS: 150.0000 mg | ORAL_TABLET | Freq: Every day | ORAL | 0 refills | Status: DC

## 2017-01-22 MED ORDER — SODIUM CHLORIDE 0.9 % IV SOLN: 500.0000 mL | INTRAVENOUS | Status: DC

## 2017-01-22 NOTE — Patient Instructions (Signed)
YOU HAD AN ENDOSCOPIC PROCEDURE TODAY AT THE [facility]:   Refer to the procedure report that was given to you for any specific questions about what was found during the examination.  If the procedure report does not answer your questions, please call your gastroenterologist to clarify.  If you requested that your care partner not be given the details of your procedure findings, then the procedure report has been included in a sealed envelope for you to review at your convenience later.  YOU SHOULD EXPECT: Some feelings of bloating in the abdomen. Passage of more gas than usual.  Walking can help get rid of the air that was put into your GI tract during the procedure and reduce the bloating. If you had a lower endoscopy (such as a colonoscopy or flexible sigmoidoscopy) you may notice spotting of blood in your stool or on the toilet paper. If you underwent a bowel prep for your procedure, you may not have a normal bowel movement for a few days.  Please Note:  You might notice some irritation and congestion in your nose or some drainage.  This is from the oxygen used during your procedure.  There is no need for concern and it should clear up in a day or so.  SYMPTOMS TO REPORT IMMEDIATELY:   Following upper endoscopy (EGD)  Vomiting of blood or coffee ground material  New chest pain or pain under the shoulder blades  Painful or persistently difficult swallowing  New shortness of breath  Fever of 100F or higher  Black, tarry-looking stools  For urgent or emergent issues, a gastroenterologist can be reached at any hour by calling [phone].   DIET:  We do recommend a small meal at first, but then you may proceed to your regular diet.  Drink plenty of fluids but you should avoid alcoholic beverages for 24 hours.  ACTIVITY:  You should plan to take it easy for the rest of today and you should NOT DRIVE or use heavy machinery until tomorrow (because of the sedation medicines used  during the test).    FOLLOW UP: Our staff will call the number listed on your records the next business day following your procedure to check on you and address any questions or concerns that you may have regarding the information given to you following your procedure. If we do not reach you, we will leave a message.  However, if you are feeling well and you are not experiencing any problems, there is no need to return our call.  We will assume that you have returned to your regular daily activities without incident.  If any biopsies were taken you will be contacted by phone or by letter within the next 1-3 weeks.  Please call us at [phone] if you have not heard about the biopsies in 3 weeks.    SIGNATURES/CONFIDENTIALITY: You and/or your care partner have signed paperwork which will be entered into your electronic medical record.  These signatures attest to the fact that that the information above on your After Visit Summary has been reviewed and is understood.  Full responsibility of the confidentiality of this discharge information lies with you and/or your care-partner. 

## 2017-01-22 NOTE — Op Note (Signed)
[facility] Patient Name: Nancy [name] Procedure Date: 01/22/2017 1:15 PM MRN: [id number] Endoscopist: Milus Banister , MD Age: 61 Referring MD:  Date of Birth: 08-22-1956 Gender: Female Account #: [id number] Procedure:                Upper GI endoscopy Indications:              Dysphagia, Heartburn Medicines:                Monitored Anesthesia Care Procedure:                Pre-Anesthesia Assessment:                           - Prior to the procedure, a History and Physical                            was performed, and patient medications and                            allergies were reviewed. The patient's tolerance of                            previous anesthesia was also reviewed. The risks                            and benefits of the procedure and the sedation                            options and risks were discussed with the patient.                            All questions were answered, and informed consent                            was obtained. Prior Anticoagulants: The patient has                            taken no previous anticoagulant or antiplatelet                            agents. ASA Grade Assessment: III - A patient with                            severe systemic disease. After reviewing the risks                            and benefits, the patient was deemed in                            satisfactory condition to undergo the procedure.                           After obtaining informed consent, the endoscope was  passed under direct vision. Throughout the                            procedure, the patient's blood pressure, pulse, and                            oxygen saturations were monitored continuously. The                            Model GIF-[id number]) scope was introduced                            through the mouth, and advanced to the third part                            of duodenum. The upper GI  endoscopy was                            accomplished without difficulty. The patient                            tolerated the procedure well. Scope In: Scope Out: Findings:                 The esophagus was normal.                           The stomach was normal.                           The examined duodenum was normal. Complications:            No immediate complications. Estimated blood loss:                            None. Estimated Blood Loss:     Estimated blood loss: none. Impression:               - Normal examination                           - I suspect your symptoms are from non erosive acid                            irritation of the esophagus Recommendation:           - Patient has a contact number available for                            emergencies. The signs and symptoms of potential                            delayed complications were discussed with the                            patient. Return to normal activities tomorrow.  Written discharge instructions were provided to the                            patient.                           - Resume previous diet. Chew your food well, eat                            slowly and take small bites.                           - Continue present medications. Omeprazole 40mg                             daily (20-30 min prior to breakfast meal). Also                            start ranitidine 150mg  pill, one pill at bedtime                            nightly.                           - Please call my office in 5-6 weeks to report on                            your progress. Milus Banister, MD 01/22/2017 1:23:14 PM This report has been signed electronically.

## 2017-01-22 NOTE — Progress Notes (Signed)
Report to PACU, RN, vss, BBS= Clear.  

## 2017-01-25 ENCOUNTER — Telehealth: Payer: Self-pay | Admitting: *Deleted

## 2017-01-25 NOTE — Telephone Encounter (Signed)
  Follow up Call-  Call back number 01/22/2017  Post procedure Call Back phone  # [phone]  Permission to leave phone message Yes  Some recent data might be hidden     Patient questions:  Do you have a fever, pain , or abdominal swelling? Yes.   Pain Score  3 Had a headache for most of the weekend Suggested she go to see her Primary Care Doc today.  Have you tolerated food without any problems? Yes.    Have you been able to return to your normal activities? No. Headache; unable to give pain #  Do you have any questions about your discharge instructions: Diet   No. Medications  No. Follow up visit  No.  Do you have questions or concerns about your Care? No.  Actions: * If pain score is 4 or above: No action needed, pain <4. Patient unable to give pain score.  "Don't know"

## 2017-01-26 ENCOUNTER — Telehealth: Payer: Self-pay

## 2017-01-26 DIAGNOSIS — R1310 Dysphagia, unspecified: Secondary | ICD-10-CM

## 2017-01-26 NOTE — Telephone Encounter (Signed)
Patient showed up today in the office. She reports that after taking the ranitidine at bedtime, within 15 minutes has a headache and dizziness. She has also noticed that her blood sugar level in the a.m. is elevated since starting this medication. She did increase her omeprazole to 40 mg in the morning, reports that it is still difficult to swallow, feels like her food is sitting in her throat associated with pain. I have instructed her to stop taking the ranitidine until she hears back from [place] on what Dr. Ardis Hughs recommends.

## 2017-01-27 NOTE — Telephone Encounter (Signed)
Patty, I didn't know if Dr. Eugenia Pancoast sent this to you or not. I am going to let you get in touch with patient and schedule this. Thanks.

## 2017-01-27 NOTE — Telephone Encounter (Signed)
Headache may be from the ranitidine.  Blood sugar elevation would be very unusual however.  I agree that she stop taking it.  I'd like her to get a MBSS with speech therapy, not sure what is causing her swallowing symptoms.  Thanks

## 2017-01-28 NOTE — Addendum Note (Signed)
Addended by: Barron Alvine on: 01/28/2017 09:45 AM   Modules accepted: Orders

## 2017-01-28 NOTE — Telephone Encounter (Signed)
You have been scheduled for a modified barium swallow on 313/18 at 1 pm. Please arrive 15 minutes prior to your test for registration. You will go to [facility] (1st Floor) for your appointment.  Should you need to cancel or reschedule your appointment, please contact [phone] [facility] [place]) or [phone] [place]). _____________________________________________________________________ A Modified Barium Swallow Study, or MBS, is a special x-ray that is taken to check swallowing skills. It is carried out by a Stage manager and a Psychologist, clinical (SLP). During this test, yourmouth, throat, and esophagus, a muscular tube which connects your mouth to your stomach, is checked. The test will help you, your doctor, and the SLP plan what types of foods and liquids are easier for you to swallow. The SLP will also identify positions and ways to help you swallow more easily and safely. What will happen during an MBS? You will be taken to an x-ray room and seated comfortably. You will be asked to swallow small amounts of food and liquid mixed with barium. Barium is a liquid or paste that allows images of your mouth, throat and esophagus to be seen on x-ray. The x-ray captures moving images of the food you are swallowing as it travels from your mouth through your throat and into your esophagus. This test helps identify whether food or liquid is entering your lungs (aspiration). The test also shows which part of your mouth or throat lacks strength or coordination to move the food or liquid in the right direction. This test typically takes 30 minutes to 1 hour to complete. ______________________________________________________________________  The pt has been notified and instructed.  Verbal understanding of all information.

## 2017-01-29 ENCOUNTER — Other Ambulatory Visit (HOSPITAL_COMMUNITY): Payer: Self-pay | Admitting: Gastroenterology

## 2017-01-29 DIAGNOSIS — R1319 Other dysphagia: Secondary | ICD-10-CM

## 2017-02-15 ENCOUNTER — Other Ambulatory Visit: Payer: Self-pay | Admitting: Internal Medicine

## 2017-02-15 DIAGNOSIS — Z1231 Encounter for screening mammogram for malignant neoplasm of breast: Secondary | ICD-10-CM

## 2017-02-16 ENCOUNTER — Ambulatory Visit (HOSPITAL_COMMUNITY)
Admission: RE | Admit: 2017-02-16 | Discharge: 2017-02-16 | Disposition: A | Discharge status: Home / Self Care | Payer: Medicare Other | Source: Ambulatory Visit | Attending: Gastroenterology | Admitting: Gastroenterology

## 2017-02-16 DIAGNOSIS — R1310 Dysphagia, unspecified: Secondary | ICD-10-CM

## 2017-02-16 DIAGNOSIS — R1319 Other dysphagia: Secondary | ICD-10-CM | POA: Insufficient documentation

## 2017-02-17 ENCOUNTER — Telehealth: Payer: Self-pay | Admitting: Gastroenterology

## 2017-02-17 NOTE — Telephone Encounter (Signed)
Pt has been notified that the results have not been reviewed as of today, I will call her when Dr Ardis Hughs makes his recommendations.

## 2017-03-25 ENCOUNTER — Ambulatory Visit
Admission: RE | Admit: 2017-03-25 | Discharge: 2017-03-25 | Disposition: A | Discharge status: Home / Self Care | Payer: Medicare Other | Source: Ambulatory Visit | Attending: Internal Medicine | Admitting: Internal Medicine

## 2017-03-25 DIAGNOSIS — Z1231 Encounter for screening mammogram for malignant neoplasm of breast: Secondary | ICD-10-CM

## 2017-05-05 ENCOUNTER — Encounter: Payer: Self-pay | Admitting: Internal Medicine

## 2017-05-05 ENCOUNTER — Ambulatory Visit (INDEPENDENT_AMBULATORY_CARE_PROVIDER_SITE_OTHER): Payer: Medicare Other | Admitting: Internal Medicine

## 2017-05-05 VITALS — BP 121/75 | HR 76 | Temp 97.9°F | Ht <= 58 in | Wt 174.0 lb

## 2017-05-05 DIAGNOSIS — N179 Acute kidney failure, unspecified: Secondary | ICD-10-CM

## 2017-05-05 DIAGNOSIS — E119 Type 2 diabetes mellitus without complications: Secondary | ICD-10-CM | POA: Diagnosis not present

## 2017-05-05 DIAGNOSIS — R109 Unspecified abdominal pain: Secondary | ICD-10-CM

## 2017-05-05 DIAGNOSIS — Z90710 Acquired absence of both cervix and uterus: Secondary | ICD-10-CM | POA: Diagnosis not present

## 2017-05-05 DIAGNOSIS — R1084 Generalized abdominal pain: Secondary | ICD-10-CM

## 2017-05-05 DIAGNOSIS — Z794 Long term (current) use of insulin: Secondary | ICD-10-CM

## 2017-05-05 DIAGNOSIS — E876 Hypokalemia: Secondary | ICD-10-CM | POA: Diagnosis not present

## 2017-05-05 DIAGNOSIS — E1121 Type 2 diabetes mellitus with diabetic nephropathy: Secondary | ICD-10-CM

## 2017-05-05 LAB — COMPREHENSIVE METABOLIC PANEL
ALBUMIN: 3.5 g/dL (ref 3.5–5.0)
ALT: 14 U/L (ref 14–54)
ANION GAP: 8 (ref 5–15)
AST: 19 U/L (ref 15–41)
Alkaline Phosphatase: 48 U/L (ref 38–126)
BUN: 13 mg/dL (ref 6–20)
CO2: 27 mmol/L (ref 22–32)
Calcium: 9.3 mg/dL (ref 8.9–10.3)
Chloride: 105 mmol/L (ref 101–111)
Creatinine, Ser: 1.48 mg/dL — ABNORMAL HIGH (ref 0.44–1.00)
GFR calc Af Amer: 44 mL/min — ABNORMAL LOW (ref >60)
GFR calc non Af Amer: 38 mL/min — ABNORMAL LOW (ref >60)
GLUCOSE: 56 mg/dL — AB (ref 65–99)
POTASSIUM: 3.4 mmol/L — AB (ref 3.5–5.1)
SODIUM: 140 mmol/L (ref 135–145)
Total Bilirubin: 0.3 mg/dL (ref 0.3–1.2)
Total Protein: 6.5 g/dL (ref 6.5–8.1)

## 2017-05-05 LAB — GLUCOSE, CAPILLARY: Glucose-Capillary: 44 mg/dL — CL (ref 65–99)

## 2017-05-05 LAB — POCT GLYCOSYLATED HEMOGLOBIN (HGB A1C): HEMOGLOBIN A1C: 5.7

## 2017-05-05 MED ORDER — BISMUTH SUBSALICYLATE 262 MG PO CHEW: 524.0000 mg | CHEWABLE_TABLET | ORAL | 0 refills | Status: DC | PRN

## 2017-05-05 MED ORDER — INSULIN GLARGINE 100 UNIT/ML ~~LOC~~ SOLN: SUBCUTANEOUS | 3 refills | Status: DC

## 2017-05-05 NOTE — Assessment & Plan Note (Addendum)
Assessment About one month ago, she ate a hamburger from [employer] which caused her to have nausea, vomiting, diarrhea for three days. Her symptoms improved somewhat, but she does not feel back to normal. She can eat fruits and drink liquids but feels nauseous and sometimes vomits when she eats solid food, like chicken and rice. She also continues to have diarrhea over this interval. Pepto Bismo offered her some relief, but she was afraid it would affect her sugars, so she limited her use to a few times. Other than a hysterectomy decades ago, she denies other abdominal surgery.  Her exam is reassuring for no signs of an acute abdomen, and she is less likely dehydrated given that she is normotensive and not tachycardic. I wonder if she has disrupted gut flora as a sequelae of food poisoning.  Plan -Check CMET today to r/o any LFT abnormalities given that she is also on a statin as well as renal dysfunction given the poor intake with concurrent diuretic and ARB use -Recommend 7-day course of a probiotic and avoiding dairy products to see if her symptoms improve. She has 1-week follow-up with her PCP. -Prescribed sugar-free bismuth subsalicylate 169 mg to be taken up to 4 times daily for symptom relief. -Recommend she return if she develops worsening abdominal pain, fever, nausea, vomiting.  ADDENDUM 05/05/2017  5:12 PM:  CBG 44 and CMET with elevated creatinine 1.5, up from baseline 1.0-1.1 and mild hypokalemia 3.4. I recommended to her over the phone she hold HCTZ and losartan and reduce morning glargine to 40 units until she can follow-up next week with her PCP, Dr. Hetty Ely, as well as call us should her CBGs trend <100. She confirmed my instructions.

## 2017-05-05 NOTE — Assessment & Plan Note (Signed)
ADDENDUM 05/05/2017  5:12 PM:  CBG 44 and CMET with elevated creatinine 1.5, up from baseline 1.0-1.1 and mild hypokalemia 3.4. I recommended to her over the phone she hold HCTZ and losartan until she can follow-up next week with her PCP, Dr. Hetty Ely. She confirmed my instructions.

## 2017-05-05 NOTE — Patient Instructions (Addendum)
For your abdominal pain, try the following.  -Avoid dairy products to see if it gets better.  -Try taking the Pepto Bismo sugar free version to see if it helps. -Ask the pharmacist about 7-day course of probiotics.  If your labwork comes back worrisome, I will call you.   Otherwise, Dr. Hetty Ely will review it with you next week.

## 2017-05-05 NOTE — Assessment & Plan Note (Signed)
Assessment In preparation for her PCP visit next week, she is agreeable to checking A1c today.  Plan -Check A1c  ADDENDUM 05/05/2017  5:13 PM:  A1c is 5.7 but CBG 44. I recommended to her over the phone she reduce morning glargine to 40 units until she can follow-up next week with her PCP, Dr. Hetty Ely, as well as call us should her CBGs trend <100. She confirmed my instructions.

## 2017-05-05 NOTE — Progress Notes (Signed)
   CC: abdominal pain  HPI:  [name] is a 61 y.o. who presents today for abdominal pain. Please see assessment & plan for status of chronic medical problems.   Past Medical History:  Diagnosis Date  . Carpal tunnel syndrome   . Carpal tunnel syndrome, bilateral    confirmed on nerve conduction studies  . Diabetes mellitus type 2, uncontrolled ([place])   . Diabetic peripheral neuropathy ([place])   . GERD (gastroesophageal reflux disease)   . Heat edema   . History of chest pain    negative cardiolite September 2007.  ETT-myoview 12/10 with EF 76%, no ischemia,   . History of endometriosis   . History of Helicobacter infection 11/07  . Hyperlipidemia     Review of Systems:  Please see each problem below for a pertinent review of systems.   Physical Exam:  Vitals:   05/05/17 1414  BP: 121/75  Pulse: 76  Temp: 97.9 F (36.6 C)  TempSrc: Oral  SpO2: 100%  Weight: 174 lb (78.9 kg)  Height: 4\' 10"  (1.473 m)   Physical Exam  Constitutional: She is oriented to person, place, and time. No distress.  HENT:  Head: Normocephalic and atraumatic.  Eyes: Conjunctivae are normal. No scleral icterus.  Abdominal: Soft. She exhibits no distension and no mass. There is tenderness (Mild. generalized to deep palpation). There is no rebound and no guarding.  Neurological: She is alert and oriented to person, place, and time.  Skin: Skin is warm and dry. She is not diaphoretic.   Assessment & Plan:   See Encounters Tab for problem based charting.  Patient discussed with Dr. Evette Doffing

## 2017-05-06 NOTE — Progress Notes (Signed)
Internal Medicine Clinic Attending  Case discussed with Dr. Patel,Rushil at the time of the visit.  We reviewed the resident's history and exam and pertinent patient test results.  I agree with the assessment, diagnosis, and plan of care documented in the resident's note.  

## 2017-05-11 ENCOUNTER — Encounter: Payer: Self-pay | Admitting: Internal Medicine

## 2017-05-11 ENCOUNTER — Ambulatory Visit (INDEPENDENT_AMBULATORY_CARE_PROVIDER_SITE_OTHER): Payer: Medicare Other | Admitting: Internal Medicine

## 2017-05-11 VITALS — BP 141/81 | HR 79 | Temp 98.2°F | Wt 176.7 lb

## 2017-05-11 DIAGNOSIS — R809 Proteinuria, unspecified: Secondary | ICD-10-CM

## 2017-05-11 DIAGNOSIS — K529 Noninfective gastroenteritis and colitis, unspecified: Secondary | ICD-10-CM | POA: Diagnosis not present

## 2017-05-11 DIAGNOSIS — E11649 Type 2 diabetes mellitus with hypoglycemia without coma: Secondary | ICD-10-CM | POA: Diagnosis not present

## 2017-05-11 DIAGNOSIS — K219 Gastro-esophageal reflux disease without esophagitis: Secondary | ICD-10-CM

## 2017-05-11 DIAGNOSIS — N179 Acute kidney failure, unspecified: Secondary | ICD-10-CM | POA: Diagnosis not present

## 2017-05-11 DIAGNOSIS — E1129 Type 2 diabetes mellitus with other diabetic kidney complication: Secondary | ICD-10-CM

## 2017-05-11 DIAGNOSIS — E785 Hyperlipidemia, unspecified: Secondary | ICD-10-CM | POA: Diagnosis not present

## 2017-05-11 DIAGNOSIS — E1142 Type 2 diabetes mellitus with diabetic polyneuropathy: Secondary | ICD-10-CM | POA: Diagnosis not present

## 2017-05-11 DIAGNOSIS — E1121 Type 2 diabetes mellitus with diabetic nephropathy: Secondary | ICD-10-CM

## 2017-05-11 DIAGNOSIS — Z794 Long term (current) use of insulin: Secondary | ICD-10-CM

## 2017-05-11 LAB — GLUCOSE, CAPILLARY
GLUCOSE-CAPILLARY: 73 mg/dL (ref 65–99)
Glucose-Capillary: 68 mg/dL (ref 65–99)
Glucose-Capillary: 73 mg/dL (ref 65–99)

## 2017-05-11 NOTE — Patient Instructions (Addendum)
Ms. Turnbaugh,   It was a pleasure seeing you today   For your diarrhea  - STOP taking the victoza  - START taking pepto-bismol before you eat   For your diabetes  - DECREASE your lantus to 40 units daily  - bring your glucose monitor with you to your next appointment   You can stop taking aspirin.   Hold off on taking your Hydrochlorothiazide and Losartan, I will call you and let you know the results of the lab work.   Return to the acute care clinic in 2 weeks and schedule an appointment to see Dr. Hetty Ely again in 2 months

## 2017-05-12 ENCOUNTER — Encounter: Payer: Self-pay | Admitting: Internal Medicine

## 2017-05-12 ENCOUNTER — Other Ambulatory Visit: Payer: Medicare Other

## 2017-05-12 DIAGNOSIS — K529 Noninfective gastroenteritis and colitis, unspecified: Secondary | ICD-10-CM

## 2017-05-12 LAB — BMP8+ANION GAP
Anion Gap: 13 mmol/L (ref 10.0–18.0)
BUN/Creatinine Ratio: 9 (ref 9–23)
BUN: 10 mg/dL (ref 6–24)
CALCIUM: 8.8 mg/dL (ref 8.7–10.2)
CHLORIDE: 109 mmol/L — AB (ref 96–106)
CO2: 24 mmol/L (ref 18–29)
Creatinine, Ser: 1.11 mg/dL — ABNORMAL HIGH (ref 0.57–1.00)
GFR calc Af Amer: 63 mL/min/{1.73_m2} (ref >59)
GFR calc non Af Amer: 54 mL/min/{1.73_m2} — ABNORMAL LOW (ref >59)
GLUCOSE: 74 mg/dL (ref 65–99)
POTASSIUM: 4.2 mmol/L (ref 3.5–5.2)
Sodium: 146 mmol/L — ABNORMAL HIGH (ref 134–144)

## 2017-05-12 NOTE — Progress Notes (Addendum)
CC: diarrhea   HPI:  [name] is a 61 y.o. woman with PMH controlled type 2 diabetes mellitus with peripheral neuropathy and proteinuria, GERD, and HLD who presents to clinic for follow up of diarrhea. She was last seen in clinic 5/30 with complaint of diarrhea which started a little bit over one month prior after an episode of food poisoning. She has the loose stools along with diffuse abdominal pain about 20 minutes after eating anything solid, it doesn't matter whether the food is carbohydrates, fatty, or dairy. She has the loose stools about 3-4 times per day. Her stools are not foul smelling and they do not float and are non bloody. She does not have the diarrhea when she eats watermelon. Denies vomiting. She has relief of the symptoms when she uses pepto bismol but the pain returns soon after.   Past Medical History:  Diagnosis Date  . Carpal tunnel syndrome, bilateral    confirmed on nerve conduction studies  . Diabetes mellitus type 2, uncontrolled ([place])   . Diabetic peripheral neuropathy ([place])   . GERD (gastroesophageal reflux disease)   . History of endometriosis   . History of Helicobacter infection 11/07  . Hyperlipidemia    Review of Systems: Please refer to HPI and problems listed below.   Physical Exam:  Vitals:   05/11/17 1316  BP: (!) 141/81  Pulse: 79  Temp: 98.2 F (36.8 C)  TempSrc: Oral  SpO2: 100%  Weight: 176 lb 11.2 oz (80.2 kg)   Physical Exam  Constitutional: She is oriented to person, place, and time. No distress.  Eyes: Conjunctivae are normal. Right eye exhibits no discharge. Left eye exhibits no discharge. No scleral icterus.  Cardiovascular: Normal rate, regular rhythm and intact distal pulses.   No murmur heard. No peripheral edema   Pulmonary/Chest: Effort normal and breath sounds normal. No respiratory distress. She has no wheezes. She has no rales.  Abdominal: Soft. Bowel sounds are normal. She exhibits no distension. There is no  tenderness. There is no guarding.  Neurological: She is alert and oriented to person, place, and time.  Skin: Skin is warm and dry. She is not diaphoretic.  No injuries or ulcers on her feet   Psychiatric: Affect and judgment normal.     Assessment & Plan:   Chronic Diarrhea  Less consistent with SIBO because symptoms are related to any solid food intake, not carbohydrates alone. Less likely related to pancreatic insufficieny, description does not sound like she's having fatty stools but I will hold her victoza until I have a better idea of what type of diarrhea this is. Ordering stool osmolality to better differentiate between secretory vs osmotic diarrhea. I really question if the IR metformin that she is on may be contributing.  - ordered stool osmolar gap, ova and parasites, and stool parasites  - hold victoza  - continue pepto bismol  -return to clinic in 2 weeks   ADDENDUM: Stool sample provided was not diarrhea so stool osmolality was canceled. Stool ova and parasite exam was negative. I called to communicate these results to the patient she reported that her loose stools have improved significantly however she continues to have cramping abdominal pain. She will continue to monitor and plans to return to clinic in 1 week.  Acute kidney injury  She was found to have a bump in her crt 1.48 up from baseline 1.0 at last office visit so her losartan and HCTZ were held and she returns today for  repeat BMP.  -ordered BMP - Crt improved to 1.1  - continue to monitor  -resume losartan and HCTZ   GERD  Previously she reported that PPIs were not working to resolve her symptoms. I reviewed her EGD from 01/2017 which showed no ulcers or gastritis. Since this procedure, omeprazole has been working to control her pain. She did not begin taking the ranitidine recommended by GI.  She has been on aspirin for primary prophylaxis but reports no history of MIs.  - continue omeprazole  - discontinue  aspirin  Diabetes  A1c 5.7 at last office visit and she was found to have an blood glucose 56 on bmet at that time which was asymptomatic, she was told to decrease lantus to 40 units daily (she was taking 58 units qAM) and at follow up today,  when she arrived to the clinic she had a blood glucose of 68 which was minimally responsive to orange juice (increased to 73). She does say that she forgot to decrease the lantus dose. I followed up with her on the phone the day following this visit, her glucose improved to 108 the following morning so she took the lantus once again after eating breakfast. I have cautioned her to only take the lantus when her blood sugar is 150. I am worried that she is not experiencing symptoms related to her hypoglycemia.  -decreased lantus to 40 units qAM - discontinue victoza (see chronic diarrhea problem) - continue metformin 1000 mg BID  -Return to clinic with glucose monitor in 2 weeks   See Encounters Tab for problem based charting.  Patient seen with Dr. Dareen Piano

## 2017-05-13 LAB — OSMOLALITY, STOOL

## 2017-05-13 NOTE — Assessment & Plan Note (Signed)
A1c 5.7 at last office visit and she was found to have an blood glucose 56 on bmet at that time which was asymptomatic, she was told to decrease lantus to 40 units daily (she was taking 58 units qAM) and at follow up today,  when she arrived to the clinic she had a blood glucose of 68 which was minimally responsive to orange juice (increased to 73). She does say that she forgot to decrease the lantus dose. I followed up with her on the phone the day following this visit, her glucose improved to 108 the following morning so she took the lantus once again after eating breakfast. I have cautioned her to only take the lantus when her blood sugar is 150. I am worried that she is not experiencing symptoms related to her hypoglycemia.  -decreased lantus to 40 units qAM - discontinue victoza (see chronic diarrhea problem) - continue metformin 1000 mg BID  -Return to clinic with glucose monitor in 2 weeks

## 2017-05-13 NOTE — Addendum Note (Signed)
Addended by: Meryl Dare on: 05/13/2017 10:28 PM   Modules accepted: Level of Service

## 2017-05-13 NOTE — Assessment & Plan Note (Signed)
Less consistent with SIBO because symptoms are related to any solid food intake, not carbohydrates alone. Less likely related to pancreatic insufficieny, description does not sound like she's having fatty stools but I will hold her victoza until I have a better idea of what type of diarrhea this is. Ordering stool osmolality to better differentiate between secretory vs osmotic diarrhea. I really question if the IR metformin that she is on may be contributing.  - ordered stool osmolar gap, ova and parasites, and stool parasites  - hold victoza  - continue pepto bismol  -return to clinic in 2 weeks

## 2017-05-13 NOTE — Assessment & Plan Note (Addendum)
Previously she reported that PPIs were not working to resolve her symptoms. I reviewed her EGD from 01/2017 which showed no ulcers or gastritis. Since this procedure, omeprazole has been working to control her pain. She did not begin taking the ranitidine recommended by GI.  She has been on aspirin for primary prophylaxis but reports no history of MIs.  - continue omeprazole  - discontinue aspirin

## 2017-05-13 NOTE — Assessment & Plan Note (Signed)
She was found to have a bump in her crt 1.48 up from baseline 1.0 at last office visit so her losartan and HCTZ were held and she returns today for repeat BMP.  -ordered BMP - Crt improved to 1.1  - continue to monitor  -resume losartan and HCTZ

## 2017-05-14 NOTE — Progress Notes (Signed)
Internal Medicine Clinic Attending  Case discussed with Dr. Hetty Ely at the time of the visit.  We reviewed the resident's history and exam and pertinent patient test results.  I agree with the assessment, diagnosis, and plan of care documented in the resident's note. I would consider holding her metformin or reducing the dose and see if the diarrhea improves.

## 2017-05-14 NOTE — Addendum Note (Signed)
Addended by: Aldine Contes on: 05/14/2017 01:20 PM   Modules accepted: Level of Service

## 2017-05-16 LAB — OVA AND PARASITE EXAMINATION

## 2017-05-19 LAB — STOOL CULTURE: E coli, Shiga toxin Assay: NEGATIVE

## 2017-05-21 ENCOUNTER — Telehealth: Payer: Self-pay

## 2017-05-21 DIAGNOSIS — E113211 Type 2 diabetes mellitus with mild nonproliferative diabetic retinopathy with macular edema, right eye: Secondary | ICD-10-CM | POA: Diagnosis not present

## 2017-05-21 DIAGNOSIS — E113212 Type 2 diabetes mellitus with mild nonproliferative diabetic retinopathy with macular edema, left eye: Secondary | ICD-10-CM | POA: Diagnosis not present

## 2017-05-21 LAB — HM DIABETES EYE EXAM

## 2017-05-21 NOTE — Telephone Encounter (Signed)
Requesting to speak with a nurse about meds. Please call back.  

## 2017-05-21 NOTE — Telephone Encounter (Signed)
I called and spoke with Nancy [name] this morning. She says that since her last office visit she has continued to have cramping abdominal pain and diarrhea with eating but this is no longer keeping her from eating. She has been checking her sugars in the morning and they are 125-200. She went for an eye exam today and was told that she had changes related to diabetes. Her ophthalmologist expressed concern for her eye changes and told her she needed to call the clinic. When I asked her more about how she has been taking her sugar she explains some concerning features- she says that she checks her blood sugar first thing in the morning and if it is "too low" she skips taking the lantus but if it is "too high" she takes the lantus and does not eat anything. She says that she does not otherwise check her blood sugar throughout the day but describes sometimes having symptoms concerning for hypoglycemia. I told her that with these features I am concerned with increasing her lantus as she has requested and that I believe that the timing of her eye exam may not have been ideal considering the difficulties that we are having with her insulin regiment. I told her that if her morning blood glucose is over 100 she should take Lantus 40 units in the morning and eat a meal. This weekend she will check her blood sugars first thing in the morning, just prior to bed, and at any point when she feels symptoms of hypoglycemia. She has an [facility] clinic appointment schedules for Tuesday 6/19 and will bring her glucose monitor to that visit. If at any point until then she has a blood glucose less than 80 or greater than 250 she will call the clinic.

## 2017-05-21 NOTE — Telephone Encounter (Signed)
Pt calls and states she went to eye doctor and was told the diabetes is causing her to have more problems with her eyes and the victoza had been helping her eyes also her lantus has been cut but her sugars are going back up, she has appt in [facility] for 6/19 but if dr blum can go ahead and change her meds or at least call her she would appreciate it very much

## 2017-05-25 ENCOUNTER — Ambulatory Visit (INDEPENDENT_AMBULATORY_CARE_PROVIDER_SITE_OTHER): Payer: Medicare Other | Admitting: Internal Medicine

## 2017-05-25 VITALS — BP 137/78 | HR 89 | Wt 174.0 lb

## 2017-05-25 DIAGNOSIS — R1030 Lower abdominal pain, unspecified: Secondary | ICD-10-CM | POA: Diagnosis not present

## 2017-05-25 DIAGNOSIS — R1013 Epigastric pain: Secondary | ICD-10-CM | POA: Diagnosis not present

## 2017-05-25 DIAGNOSIS — Z794 Long term (current) use of insulin: Secondary | ICD-10-CM

## 2017-05-25 DIAGNOSIS — R1031 Right lower quadrant pain: Secondary | ICD-10-CM

## 2017-05-25 DIAGNOSIS — E119 Type 2 diabetes mellitus without complications: Secondary | ICD-10-CM | POA: Diagnosis not present

## 2017-05-25 DIAGNOSIS — R197 Diarrhea, unspecified: Secondary | ICD-10-CM

## 2017-05-25 DIAGNOSIS — K529 Noninfective gastroenteritis and colitis, unspecified: Secondary | ICD-10-CM

## 2017-05-25 DIAGNOSIS — R1032 Left lower quadrant pain: Secondary | ICD-10-CM | POA: Diagnosis not present

## 2017-05-25 DIAGNOSIS — E1121 Type 2 diabetes mellitus with diabetic nephropathy: Secondary | ICD-10-CM

## 2017-05-25 MED ORDER — LIRAGLUTIDE 18 MG/3ML ~~LOC~~ SOPN: 1.2000 mg | PEN_INJECTOR | Freq: Every day | SUBCUTANEOUS | 5 refills | Status: DC

## 2017-05-25 NOTE — Progress Notes (Signed)
   CC: Follow up for diarrhea and diabetes management  HPI:  [name] is a 61 y.o. woman here with a  2 month history of ongoing diarrhea and wanting to restart her Victoza.  See problem based assessment and plan below for additional details  Past Medical History:  Diagnosis Date  . Carpal tunnel syndrome, bilateral    confirmed on nerve conduction studies  . Diabetes mellitus type 2, uncontrolled ([place])   . Diabetic peripheral neuropathy ([place])   . GERD (gastroesophageal reflux disease)   . History of endometriosis   . History of Helicobacter infection 11/07  . Hyperlipidemia     Review of Systems:  Review of Systems  Constitutional: Negative for chills, fever and weight loss.  Respiratory: Negative for shortness of breath.   Cardiovascular: Negative for chest pain.  Gastrointestinal: Positive for abdominal pain and diarrhea. Negative for blood in stool, melena, nausea and vomiting.    Physical Exam: Physical Exam  Constitutional: She is well-developed, well-nourished, and in no distress.  HENT:  Mouth/Throat: Oropharynx is clear and moist. No oropharyngeal exudate.  Cardiovascular: Normal rate and regular rhythm.   Pulmonary/Chest: Effort normal and breath sounds normal.  Abdominal: Soft. There is no rebound and no guarding.  Mild tenderness over epigastric area, bilateral lower quadrants  Musculoskeletal: She exhibits no edema.    Vitals:   05/25/17 1025  BP: 137/78  Pulse: 89  SpO2: 100%  Weight: 174 lb (78.9 kg)    Assessment & Plan:   See Encounters Tab for problem based charting.  Patient discussed with Dr. Lynnae January

## 2017-05-25 NOTE — Patient Instructions (Signed)
It was a pleasure to see you today Ms. Daughtridge.  We will check additional test today to look for possible absorption problems leading to your diarrhea. In the meantime I would recommend resuming your victoza but possibly holding your metformin to see if there is an effect on the diarrhea.  We should see you again ina  Few weeks to see if there is progress on this problem. If it is not improving we may need to try different medicine, or can resume your diabetic medications since they would be unrelated.

## 2017-05-26 ENCOUNTER — Encounter: Payer: Self-pay | Admitting: Internal Medicine

## 2017-05-26 LAB — VITAMIN B12: VITAMIN B 12: 337 pg/mL (ref 232–1245)

## 2017-05-26 LAB — TISSUE TRANSGLUTAMINASE ABS,IGG,IGA

## 2017-05-26 LAB — FERRITIN: Ferritin: 32 ng/mL (ref 15–150)

## 2017-05-26 NOTE — Assessment & Plan Note (Addendum)
HPI: She has continued episodes of loose, urgent bowel movements 3-4 times per day and precipitated after eating. She has lower abdominal pain that does not improve or worsen with these. She stopped taking her victoza since 2 weeks ago as recommended but has not noticed any change in symptoms at that time. Overall the frequency and severity of her diarrhea is partially decreased compared to 2-3 weeks ago, and she is tolerating normal meals.  A: Lower abdominal pain and diarrhea for about 6 weeks duration The differential remains broad. She has fairly recent, normal EGD in 01/2017 and colonoscopy in 03/2014. Stool analysis was limited but did show negative parasite/ova study. Several possible causes remain including IBS, celiac disease, acquired lactose intolerance, small bowel malabsorption, microscopic colitis, or other functional diarrhea  P: -We will check for malabsorption syndromes today with ferritin, B12, TTG -I recommended she stop taking her metformin as a possible contributor but can resume the Victoza -Try a low lactose/dairy diet in case she has decreased lactose tolerance after a GI infection -If studies all remain normal and symptoms are not improving despite medication changes and time, an additional step in the future may be for repeat colonoscopy with random biopsy to exclude microscopic colitis

## 2017-05-26 NOTE — Assessment & Plan Note (Signed)
HPI: She is very anxious about her diabetes today after a visit with the ophthalmologist commented on some evidence of progress in her diabetic retinopathy. She stopped her victoza as recommended and has noticed some higher CBGs at home up to the 200s. She does continue to have relatively low values in the mornings before breakfast as low as the 60s. When she sees this, she waits until after breakfast before injecting her 40 units of lantus insulin. She has not missed any doses of the daily medications. She denies symptoms of increased thirst and urinary frequency.  A: Type 2 diabetes, controlled with A1c goal <7.0% Her latest Hgb A1c was 5.7% on 5/30 which suggest very good control, but there are larger variations over the past 2 weeks while modifying her medications. Rare readings in the 60s are concerning as she has no symptomatic complaints with these, but she is very concerned about her diabetes control today and does not want to decrease basal insulin at all especially while we are withdrawing the metformin.  P: Stop metformin 1,000mg  BID for now while working up persistent diarrhea Restart Victoza 1.2mg  daily Continue lantus 40U daily

## 2017-05-26 NOTE — Progress Notes (Signed)
Internal Medicine Clinic Attending  Case discussed with Dr. Rice at the time of the visit.  We reviewed the resident's history and exam and pertinent patient test results.  I agree with the assessment, diagnosis, and plan of care documented in the resident's note.  

## 2017-06-02 ENCOUNTER — Encounter: Payer: Self-pay | Admitting: *Deleted

## 2017-06-08 ENCOUNTER — Ambulatory Visit: Payer: Medicare Other

## 2017-06-11 ENCOUNTER — Ambulatory Visit (INDEPENDENT_AMBULATORY_CARE_PROVIDER_SITE_OTHER): Payer: Medicare Other | Admitting: Internal Medicine

## 2017-06-11 ENCOUNTER — Encounter: Payer: Self-pay | Admitting: Internal Medicine

## 2017-06-11 DIAGNOSIS — E1121 Type 2 diabetes mellitus with diabetic nephropathy: Secondary | ICD-10-CM | POA: Diagnosis not present

## 2017-06-11 DIAGNOSIS — B182 Chronic viral hepatitis C: Secondary | ICD-10-CM | POA: Diagnosis not present

## 2017-06-11 DIAGNOSIS — E1136 Type 2 diabetes mellitus with diabetic cataract: Secondary | ICD-10-CM

## 2017-06-11 DIAGNOSIS — I10 Essential (primary) hypertension: Secondary | ICD-10-CM

## 2017-06-11 DIAGNOSIS — K740 Hepatic fibrosis: Secondary | ICD-10-CM

## 2017-06-11 DIAGNOSIS — Z794 Long term (current) use of insulin: Secondary | ICD-10-CM | POA: Diagnosis not present

## 2017-06-11 DIAGNOSIS — E11319 Type 2 diabetes mellitus with unspecified diabetic retinopathy without macular edema: Secondary | ICD-10-CM | POA: Diagnosis not present

## 2017-06-11 DIAGNOSIS — Z79899 Other long term (current) drug therapy: Secondary | ICD-10-CM | POA: Diagnosis not present

## 2017-06-11 LAB — GLUCOSE, CAPILLARY: GLUCOSE-CAPILLARY: 130 mg/dL — AB (ref 65–99)

## 2017-06-11 NOTE — Patient Instructions (Addendum)
Nancy [name],   Nancy [name] are doing a great job with your diabetes,  Continue taking the Lantus 40 units daily  Continue taking Victoza 1.2 daily  Do not take the metformin   Schedule a follow up appointment with me in the beginning of September.  Please call me if you have ANY concerns or complaints [phone]

## 2017-06-11 NOTE — Progress Notes (Signed)
   CC: follow up for diabetes  HPI:  Ms.Colton Brower is a 61 y.o. with PMH of type 2 diabetes complicated by retinopathy and nephropathy, chronic hepatitis C with hepatic fibrosis, and hypertension who presents for follow up of diabetes.   Past Medical History:  Diagnosis Date  . Carpal tunnel syndrome, bilateral    confirmed on nerve conduction studies  . Diabetes mellitus type 2, uncontrolled ([place])   . Diabetic peripheral neuropathy ([place])   . GERD (gastroesophageal reflux disease)   . History of endometriosis   . History of Helicobacter infection 11/07  . Hyperlipidemia    Review of Systems: Please refer to HPI and assessment and plans tab for pertinent review of systems, all others reviewed and negative.   Physical Exam:  Vitals:   06/11/17 1101  BP: (!) 148/81  Pulse: 69  Temp: 97.9 F (36.6 C)  TempSrc: Oral  SpO2: 100%  Weight: 175 lb 4.8 oz (79.5 kg)  Height: 4\' 10"  (1.473 m)   Physical Exam  Constitutional: She is oriented to person, place, and time. She appears well-developed and well-nourished. No distress.  Eyes: Conjunctivae are normal. Right eye exhibits no discharge. Left eye exhibits no discharge. No scleral icterus.  Neck: Normal range of motion.  Cardiovascular: Normal rate and regular rhythm.   No murmur heard. Pulmonary/Chest: Effort normal. No respiratory distress. She has no wheezes. She has no rales.  Abdominal: Soft. Bowel sounds are normal. She exhibits no distension. There is no tenderness.  Neurological: She is alert and oriented to person, place, and time.  Skin: Skin is warm and dry. She is not diaphoretic.    Assessment & Plan:   Diabetes Mellitus  Presents for follow up of diabetes. She had come into the clinic on 5/30 for cramping abdominal pain and diarrhea, at that visit she was found to have a blood glucose of 44 on BMP which was asymptomatic. Lantus was decreased and she was told to avoid dairy products. She returned to clinic for  follow up one week later and again was found to be in asymptomatic hypoglycemia. It was noticed that the victoza had been started just prior to the start of her diarrhea so this was held and stool sample was collected and tested. After this visit she had an eye exam and was told that she had retinopathy and cataracts. Around the time of the eye exam she had blood glucose in the mid 200s which likely contributed in some part to the sudden worsening of her vision. When she presented for follow up again for the diarrhea victoza was restarted and metformin was held. Today she presents for follow up of diabetes management and the diarrhea. She says that her abdominal pain and diarrhea have resolved since she stopped taking the metformin. She brings her meter with her which shows an average blood glucose of 150 over the past month and lowest blood glucose 96, her blood glucose is 130 on POC check in the clinic today. The regiment that she is on has achieved good blood glucose control so this will be continued.   -Follow up ~ the first week of September for follow up A1c  -continue victoza 1.2 mg daily and Lantus 40 units daily  - will not restart metformin  See Encounters Tab for problem based charting.  Patient discussed with Dr. Dareen Piano

## 2017-06-13 NOTE — Assessment & Plan Note (Signed)
Presents for follow up of diabetes. She had come into the clinic on 5/30 for cramping abdominal pain and diarrhea, at that visit she was found to have a blood glucose of 44 on BMP which was asymptomatic. Lantus was decreased and she was told to avoid dairy products. She returned to clinic for follow up one week later and again was found to be in asymptomatic hypoglycemia. It was noticed that the victoza had been started just prior to the start of her diarrhea so this was held and stool sample was collected and tested. After this visit she had an eye exam and was told that she had retinopathy and cataracts. Around the time of the eye exam she had blood glucose in the mid 200s which likely contributed in some part to the sudden worsening of her vision. When she presented for follow up again for the diarrhea victoza was restarted and metformin was held. Today she presents for follow up of diabetes management and the diarrhea. She says that her abdominal pain and diarrhea have resolved since she stopped taking the metformin. She brings her meter with her which shows an average blood glucose of 150 over the past month and lowest blood glucose 96, her blood glucose is 130 on POC check in the clinic today. The regiment that she is on has achieved good blood glucose control so this will be continued.   -Follow up ~ the first week of September for follow up A1c  -continue victoza 1.2 mg daily and Lantus 40 units daily  - will not restart metformin

## 2017-06-14 NOTE — Progress Notes (Signed)
Internal Medicine Clinic Attending  Case discussed with Dr. Blum at the time of the visit.  We reviewed the resident's history and exam and pertinent patient test results.  I agree with the assessment, diagnosis, and plan of care documented in the resident's note. 

## 2017-07-05 ENCOUNTER — Other Ambulatory Visit: Payer: Self-pay | Admitting: Internal Medicine

## 2017-07-05 DIAGNOSIS — Z794 Long term (current) use of insulin: Principal | ICD-10-CM

## 2017-07-05 DIAGNOSIS — E1121 Type 2 diabetes mellitus with diabetic nephropathy: Secondary | ICD-10-CM

## 2017-07-05 NOTE — Telephone Encounter (Signed)
Needs to speak with a nurse about meds. Please call back.  

## 2017-08-02 DIAGNOSIS — N182 Chronic kidney disease, stage 2 (mild): Secondary | ICD-10-CM

## 2017-08-02 DIAGNOSIS — E1122 Type 2 diabetes mellitus with diabetic chronic kidney disease: Secondary | ICD-10-CM | POA: Insufficient documentation

## 2017-08-02 DIAGNOSIS — N183 Chronic kidney disease, stage 3 unspecified: Secondary | ICD-10-CM | POA: Insufficient documentation

## 2017-08-02 NOTE — Progress Notes (Addendum)
CC: follow up of hypertension   HPI:  Ms.Carolyne [name] is a 61 y.o. with PMH as listed below who presents for follow up of hypertension, diabetes, proteinuria and CKD. Please see the assessment and plans for the status of the patient chronic medical problems.   Past Medical History:  Diagnosis Date  . Carpal tunnel syndrome, bilateral    confirmed on nerve conduction studies  . Diabetes mellitus type 2, uncontrolled ([place])   . Diabetic peripheral neuropathy ([place])   . GERD (gastroesophageal reflux disease)   . History of endometriosis   . History of Helicobacter infection 11/07  . Hyperlipidemia    Review of Systems:  Refer to history of present illness and assessment and plans for pertinent review of systems, all others reviewed and negative  Physical Exam:  Vitals:   08/03/17 1323  BP: 130/80  Pulse: 75  Temp: 97.6 F (36.4 C)  TempSrc: Oral  SpO2: 97%  Weight: 174 lb 9.6 oz (79.2 kg)  Height: 4\' 10"  (1.473 m)   Physical Exam  Constitutional: She is well-developed, well-nourished, and in no distress. No distress.  Cardiovascular: Normal rate and regular rhythm.   No murmur heard. No peripheral edema   Pulmonary/Chest: Effort normal and breath sounds normal. No respiratory distress. She has no wheezes. She has no rales.  Abdominal: Soft. Bowel sounds are normal. She exhibits no distension. There is no tenderness.  Skin: She is not diaphoretic.   Assessment & Plan:   Type 2 DM with retinopathy and nephropathy  Last A1c 04/2017 5.7. She did not bring her meter today but recalls that sometimes her evening blood sugar is 180-200. Only hypoglycemia reading was 78. She has symptoms of hypoglycemia at times but they resolve when she eats a snack. - A1c today  - continue victoza 0.2 ml qd  - continue lantus 40 units qHS   Proteinuria  CKD stage 2  Persistent proteinuria in the setting of relatively controlled diabetes and relatively controlled HTN. No known hx of  malignancy or autoimmune disease. The onset of this proteinuria can not be traced to a recent contrast study. GFR has also had gradual decline around the same timeline of development of proteinuria.  Will order a urinalysis with microscopy to assess for glomerular disease and test for orthostatic proteinuria.  - collection of morning urine for protein to creatinine ratio, she will bring a sample back to the clinic when she has collected her morning urine  - urinalysis with microscopy  - BMP  - increase verapamil to 180 mg daily , continue losartan 100 mg daily  - continue DM and HTN management   Addendum: Urinalysis with microscopy did not show hematuria or cast and AM urine showed persistences of proteinuria ruling out transient orthostatic proteinuria. DM and CKD are most likely the cause of her proteinuria, will continue to monitor with yearly microalbumin/crt.    Hypertension  BP Readings from Last 3 Encounters:  08/03/17 130/80  06/11/17 (!) 148/81  05/25/17 137/78  BP is controlled today, goal is <130/80 for diabetes mellitus and proteinuria. She enies constipation or dizziness, and is not bradycardic and has no edema. In an attempt to optimize control of  proteinuria and kidney function I am discontinuing the HCTZ in exchange for increasing the verapamil dose.  - discontinue HCTZ  - increase verapamil to 180 mg daily  - continue losartan 100 mg daily   Constipation  Controlled with senna, when she does not take this medication she can  go about 3-4 days without having a bowel movement.  - continue senna prn  Depression, mild uncontrolled  Mother expresses some concern for patients forgetfulness and seeming overwhelmed lately. She says the patient has been helping out friends a lot and may be taking on too much. The patient expresses that she does feel depressed and has less interest at times. PHQ = 5. She denies SI or HI. She would like to try going to counseling for this.  - referral  to counseling   Morbid obesity  BMI persistently over 35 associated with morbidities including T2 DM and HTN. Some discussion about healthy eating today. She does use the gym.   - trial of diet modification and exercise for weight loss.   See Encounters Tab for problem based charting.  Patient discussed with Dr. Angelia Mould

## 2017-08-03 ENCOUNTER — Ambulatory Visit (INDEPENDENT_AMBULATORY_CARE_PROVIDER_SITE_OTHER): Payer: Medicare Other | Admitting: Internal Medicine

## 2017-08-03 ENCOUNTER — Encounter (INDEPENDENT_AMBULATORY_CARE_PROVIDER_SITE_OTHER): Payer: Self-pay

## 2017-08-03 ENCOUNTER — Encounter: Payer: Self-pay | Admitting: Internal Medicine

## 2017-08-03 VITALS — BP 130/80 | HR 75 | Temp 97.6°F | Ht <= 58 in | Wt 174.6 lb

## 2017-08-03 DIAGNOSIS — N182 Chronic kidney disease, stage 2 (mild): Secondary | ICD-10-CM

## 2017-08-03 DIAGNOSIS — H25043 Posterior subcapsular polar age-related cataract, bilateral: Secondary | ICD-10-CM | POA: Diagnosis not present

## 2017-08-03 DIAGNOSIS — Z6836 Body mass index (BMI) 36.0-36.9, adult: Secondary | ICD-10-CM

## 2017-08-03 DIAGNOSIS — E11319 Type 2 diabetes mellitus with unspecified diabetic retinopathy without macular edema: Secondary | ICD-10-CM | POA: Diagnosis not present

## 2017-08-03 DIAGNOSIS — I10 Essential (primary) hypertension: Secondary | ICD-10-CM

## 2017-08-03 DIAGNOSIS — E1122 Type 2 diabetes mellitus with diabetic chronic kidney disease: Secondary | ICD-10-CM | POA: Diagnosis not present

## 2017-08-03 DIAGNOSIS — I129 Hypertensive chronic kidney disease with stage 1 through stage 4 chronic kidney disease, or unspecified chronic kidney disease: Secondary | ICD-10-CM | POA: Diagnosis not present

## 2017-08-03 DIAGNOSIS — K5909 Other constipation: Secondary | ICD-10-CM

## 2017-08-03 DIAGNOSIS — H2511 Age-related nuclear cataract, right eye: Secondary | ICD-10-CM | POA: Diagnosis not present

## 2017-08-03 DIAGNOSIS — H2513 Age-related nuclear cataract, bilateral: Secondary | ICD-10-CM | POA: Diagnosis not present

## 2017-08-03 DIAGNOSIS — Z794 Long term (current) use of insulin: Secondary | ICD-10-CM

## 2017-08-03 DIAGNOSIS — Z79899 Other long term (current) drug therapy: Secondary | ICD-10-CM | POA: Diagnosis not present

## 2017-08-03 DIAGNOSIS — H25013 Cortical age-related cataract, bilateral: Secondary | ICD-10-CM | POA: Diagnosis not present

## 2017-08-03 DIAGNOSIS — H02839 Dermatochalasis of unspecified eye, unspecified eyelid: Secondary | ICD-10-CM | POA: Diagnosis not present

## 2017-08-03 DIAGNOSIS — F320 Major depressive disorder, single episode, mild: Secondary | ICD-10-CM

## 2017-08-03 MED ORDER — VERAPAMIL HCL ER 180 MG PO TBCR: 180.0000 mg | EXTENDED_RELEASE_TABLET | Freq: Every day | ORAL | 11 refills | Status: DC

## 2017-08-03 MED ORDER — LOSARTAN POTASSIUM 100 MG PO TABS: 100.0000 mg | ORAL_TABLET | Freq: Every day | ORAL | 4 refills | Status: DC

## 2017-08-03 NOTE — Patient Instructions (Signed)
Ms. Chaisson,   Stop taking HCTZ   Start taking Verapamil 180 mg daily  Continue taking losartan 100 mg daily   Colect a urine sample in the morning   Schedule a follow up appointment in 1-2 months

## 2017-08-04 DIAGNOSIS — F32A Depression, unspecified: Secondary | ICD-10-CM | POA: Insufficient documentation

## 2017-08-04 DIAGNOSIS — F329 Major depressive disorder, single episode, unspecified: Secondary | ICD-10-CM | POA: Insufficient documentation

## 2017-08-04 LAB — URINALYSIS, ROUTINE W REFLEX MICROSCOPIC
Bilirubin, UA: NEGATIVE
Glucose, UA: NEGATIVE
Ketones, UA: NEGATIVE
LEUKOCYTES UA: NEGATIVE
Nitrite, UA: NEGATIVE
PH UA: 5 (ref 5.0–7.5)
RBC, UA: NEGATIVE
Specific Gravity, UA: 1.016 (ref 1.005–1.030)
Urobilinogen, Ur: 0.2 mg/dL (ref 0.2–1.0)

## 2017-08-04 LAB — MICROSCOPIC EXAMINATION: Casts: NONE SEEN /lpf

## 2017-08-04 LAB — BMP8+ANION GAP
Anion Gap: 16 mmol/L (ref 10.0–18.0)
BUN / CREAT RATIO: 16 (ref 9–23)
BUN: 17 mg/dL (ref 6–24)
CO2: 22 mmol/L (ref 20–29)
CREATININE: 1.06 mg/dL — AB (ref 0.57–1.00)
Calcium: 9.1 mg/dL (ref 8.7–10.2)
Chloride: 103 mmol/L (ref 96–106)
GFR, EST AFRICAN AMERICAN: 66 mL/min/{1.73_m2} (ref >59)
GFR, EST NON AFRICAN AMERICAN: 58 mL/min/{1.73_m2} — AB (ref >59)
Glucose: 96 mg/dL (ref 65–99)
POTASSIUM: 3.5 mmol/L (ref 3.5–5.2)
SODIUM: 141 mmol/L (ref 134–144)

## 2017-08-04 MED ORDER — SENNOSIDES 8.6 MG PO TABS: 1.0000 | ORAL_TABLET | ORAL | 3 refills | Status: DC | PRN

## 2017-08-04 NOTE — Assessment & Plan Note (Signed)
BMI persistently over 35 associated with morbidities including T2 DM and HTN. Some discussion about healthy eating today. She does use the gym.   - trial of diet modification and exercise for weight loss.

## 2017-08-04 NOTE — Assessment & Plan Note (Signed)
BP Readings from Last 3 Encounters:  08/03/17 130/80  06/11/17 (!) 148/81  05/25/17 137/78  BP is controlled today, goal is <130/80 for diabetes mellitus and proteinuria. She enies constipation or dizziness, and is not bradycardic and has no edema. In an attempt to optimize control of  proteinuria and kidney function I am discontinuing the HCTZ in exchange for increasing the verapamil dose.  - discontinue HCTZ  - increase verapamil to 180 mg daily  - continue losartan 100 mg daily

## 2017-08-04 NOTE — Assessment & Plan Note (Signed)
Mother expresses some concern for patients forgetfulness and seeming overwhelmed lately. She says the patient has been helping out friends a lot and may be taking on too much. The patient expresses that she does feel depressed and has less interest at times. PHQ = 5. She denies SI or HI. She would like to try going to counseling for this.  - referral to counseling

## 2017-08-04 NOTE — Assessment & Plan Note (Signed)
Controlled with senna, when she does not take this medication she can go about 3-4 days without having a bowel movement.  - continue senna prn

## 2017-08-04 NOTE — Assessment & Plan Note (Signed)
Last A1c 04/2017 5.7. She did not bring her meter today but recalls that sometimes her evening blood sugar is 180-200. Only hypoglycemia reading was 78. She has symptoms of hypoglycemia at times but they resolve when she eats a snack. - A1c today  - continue victoza 0.2 ml qd  - continue lantus 40 units qHS

## 2017-08-04 NOTE — Assessment & Plan Note (Addendum)
Persistent proteinuria in the setting of relatively controlled diabetes and relatively controlled HTN. No known hx of malignancy or autoimmune disease. The onset of this proteinuria can not be traced to a recent contrast study. GFR has also had gradual decline around the same timeline of development of proteinuria.  Will order a urinalysis with microscopy to assess for glomerular disease and test for orthostatic proteinuria.  - collection of morning urine for protein to creatinine ratio, she will bring a sample back to the clinic when she has collected her morning urine  - urinalysis with microscopy  - BMP  - increase verapamil to 180 mg daily , continue losartan 100 mg daily  - continue DM and HTN management   Addendum: Urinalysis with microscopy did not show hematuria or cast and AM urine showed persistences of proteinuria ruling out transient orthostatic proteinuria. DM and CKD are most likely the cause of her proteinuria, will continue to monitor with yearly microalbumin/crt.

## 2017-08-05 ENCOUNTER — Other Ambulatory Visit: Payer: Self-pay

## 2017-08-05 NOTE — Telephone Encounter (Signed)
Tried to call pharm, it is closed for lunch, will call back this pm

## 2017-08-05 NOTE — Telephone Encounter (Signed)
Anderson Malta with [facility], requesting to speak with a nurse about   senna (SENOKOT) 8.6 MG tablet

## 2017-08-09 NOTE — Progress Notes (Signed)
Internal Medicine Clinic Attending  Case discussed with Dr. Blum at the time of the visit.  We reviewed the resident's history and exam and pertinent patient test results.  I agree with the assessment, diagnosis, and plan of care documented in the resident's note. 

## 2017-09-03 ENCOUNTER — Other Ambulatory Visit: Payer: Medicare Other

## 2017-09-03 ENCOUNTER — Encounter (INDEPENDENT_AMBULATORY_CARE_PROVIDER_SITE_OTHER): Payer: Self-pay

## 2017-09-03 DIAGNOSIS — N182 Chronic kidney disease, stage 2 (mild): Secondary | ICD-10-CM | POA: Diagnosis not present

## 2017-09-04 LAB — PROTEIN / CREATININE RATIO, URINE
Creatinine, Urine: 85.3 mg/dL
Protein, Ur: 318.9 mg/dL
Protein/Creat Ratio: 3739 mg/g creat — ABNORMAL HIGH (ref 0–200)

## 2017-09-08 ENCOUNTER — Emergency Department (HOSPITAL_COMMUNITY)
Admission: EM | Admit: 2017-09-08 | Discharge: 2017-09-08 | Disposition: A | Discharge status: Home / Self Care | Payer: Medicare Other | Attending: Emergency Medicine | Admitting: Emergency Medicine

## 2017-09-08 ENCOUNTER — Encounter (HOSPITAL_COMMUNITY): Payer: Self-pay

## 2017-09-08 ENCOUNTER — Emergency Department (HOSPITAL_COMMUNITY): Payer: Medicare Other

## 2017-09-08 DIAGNOSIS — M545 Low back pain: Secondary | ICD-10-CM | POA: Diagnosis not present

## 2017-09-08 DIAGNOSIS — E11319 Type 2 diabetes mellitus with unspecified diabetic retinopathy without macular edema: Secondary | ICD-10-CM | POA: Insufficient documentation

## 2017-09-08 DIAGNOSIS — M5442 Lumbago with sciatica, left side: Secondary | ICD-10-CM | POA: Diagnosis not present

## 2017-09-08 DIAGNOSIS — N182 Chronic kidney disease, stage 2 (mild): Secondary | ICD-10-CM | POA: Insufficient documentation

## 2017-09-08 DIAGNOSIS — Z79899 Other long term (current) drug therapy: Secondary | ICD-10-CM | POA: Diagnosis not present

## 2017-09-08 DIAGNOSIS — Z794 Long term (current) use of insulin: Secondary | ICD-10-CM | POA: Diagnosis not present

## 2017-09-08 DIAGNOSIS — E1122 Type 2 diabetes mellitus with diabetic chronic kidney disease: Secondary | ICD-10-CM | POA: Diagnosis not present

## 2017-09-08 DIAGNOSIS — E1140 Type 2 diabetes mellitus with diabetic neuropathy, unspecified: Secondary | ICD-10-CM | POA: Insufficient documentation

## 2017-09-08 DIAGNOSIS — E1121 Type 2 diabetes mellitus with diabetic nephropathy: Secondary | ICD-10-CM | POA: Insufficient documentation

## 2017-09-08 DIAGNOSIS — M25552 Pain in left hip: Secondary | ICD-10-CM | POA: Diagnosis not present

## 2017-09-08 MED ORDER — HYDROCODONE-ACETAMINOPHEN 5-325 MG PO TABS
1.0000 | ORAL_TABLET | Freq: Once | ORAL | Status: AC
  Administered 2017-09-08: 1 via ORAL
  Filled 2017-09-08: qty 1

## 2017-09-08 MED ORDER — CYCLOBENZAPRINE HCL 5 MG PO TABS: 5.0000 mg | ORAL_TABLET | Freq: Two times a day (BID) | ORAL | 0 refills | Status: DC | PRN

## 2017-09-08 NOTE — ED Triage Notes (Signed)
Patient complains of lower back pain x 2 weeks. Started after lifting water, pain with any change in position

## 2017-09-08 NOTE — Discharge Instructions (Signed)
Please keep your appointment with your primary care provider on October 9.  650 mg of Tylenol can be taken every 6 hours for pain control. You can also take Flexeril up to 2 times days. Please do not drive or work while using this medication because it can make you sleepy.  Ice can be applied for 15-20 minutes up to 3-4 times a day. I have included some stretches that may also help to improve your pain.  If you develop new or worsening symptoms, including a severe fever, weakness in the legs, or you lose control of your bowels or bladder, please return to the emergency department for re-evaluation.

## 2017-09-08 NOTE — ED Provider Notes (Signed)
[facility] DEPT Provider Note   CSN: [id number] Arrival date & time: 09/08/17  1436     History   Chief Complaint Chief Complaint  Patient presents with  . Back Pain    HPI Nancy [name] is a 61 y.o. female with a history of chronic kidney disease and DM who presents to the emergency department with low back pain that has worsened over the last 2 weeks. She reports the pain is aggravated with positional changes, particularly with rotating her torso to the right, and improves once she is in a new position. The pain radiates down her right leg as sharp pain she also complains of left leg numbness to her toes. No pain or numbness in her right leg. No known injury or trauma. She states that she recalls lifting a 24 of bottled water, but reports the pain 100 been present for several days prior. She denies fever, chills, urinary or fecal incontinence. No history of previous low back injuries or surgeries.   She reports a history of similar symptoms several years ago that improved spontaneously after about a week, and became concerned since her symptoms have been ongoing for 2 weeks. No treatment prior to arrival.  The history is provided by the patient. No language interpreter was used.    Past Medical History:  Diagnosis Date  . Carpal tunnel syndrome, bilateral    confirmed on nerve conduction studies  . Diabetes mellitus type 2, uncontrolled ([place])   . Diabetic peripheral neuropathy ([place])   . GERD (gastroesophageal reflux disease)   . History of endometriosis   . History of Helicobacter infection 11/07  . Hyperlipidemia     Patient Active Problem List   Diagnosis Date Noted  . Morbid obesity ([place]) 08/04/2017  . Depression 08/04/2017  . CKD (chronic kidney disease) stage 2, GFR 60-89 ml/min 08/02/2017  . Diabetic nephropathy ([place]) 11/06/2016  . Other constipation 09/08/2016  . Liver fibrosis 02/17/2016  . Chronic arthritis associated with viral hepatitis ([place]) 11/21/2015    . Chronic hepatitis C without hepatic coma ([place]) 09/12/2015  . Health care maintenance 10/03/2014  . GERD (gastroesophageal reflux disease) 01/16/2014  . Tubular adenoma of colon 07/31/2011  . Carpal tunnel syndrome, bilateral   . Essential hypertension 11/09/2006  . Controlled diabetes mellitus with retinopathy ([place]) 12/07/2002    Past Surgical History:  Procedure Laterality Date  . ABDOMINAL HYSTERECTOMY    . CARPAL TUNNEL RELEASE      OB History    No data available       Home Medications    Prior to Admission medications   Medication Sig Start Date End Date Taking? Authorizing Provider  ACCU-CHEK FASTCLIX LANCETS MISC Use to check blood sugar up to 3 times a day DX code 250.02 insulin requiring 11/06/16   Ledell Noss, MD  atorvastatin (LIPITOR) 40 MG tablet Take 1 tablet (40 mg total) by mouth daily. 11/06/16 11/06/17  Ledell Noss, MD  cyclobenzaprine (FLEXERIL) 5 MG tablet Take 1 tablet (5 mg total) by mouth 2 (two) times daily as needed for muscle spasms. 09/08/17   Kathlene Yano A, PA-C  glucose blood (ACCU-CHEK SMARTVIEW) test strip USE TO CHECK BLOOD SUGAR 3 to 4 times daily. diag code E11.9. Insulin dependent 12/15/16   Ledell Noss, MD  insulin glargine (LANTUS) 100 UNIT/ML injection INJECT 40 UNITS INTO THE SKIN EVERY MORNING WHEN YOU WAKE UP 05/05/17   Riccardo Dubin, MD  Insulin Pen Needle (CAREFINE PEN NEEDLES) 31G X 8 MM MISC Please  use with liraglutide. Use 0.6 mg once daily for a week. Thereafter, you will take 1.2 mg daily. 11/06/16   Ledell Noss, MD  Insulin Syringes, Disposable, U-100 1 ML MISC 2 Syringes by Does not apply route daily. Use to inject insulin into the skin 1 time daily. diag code E11.9. Insulin dependent 12/15/16   Ledell Noss, MD  losartan (COZAAR) 100 MG tablet Take 1 tablet (100 mg total) by mouth daily. 08/03/17   Ledell Noss, MD  omeprazole (PRILOSEC) 40 MG capsule Take 1 capsule (40 mg total) by mouth daily. 01/19/17   Milus Banister, MD  senna (SENOKOT)  8.6 MG tablet Take 1 tablet (8.6 mg total) by mouth as needed for constipation. 08/04/17   Ledell Noss, MD  verapamil (CALAN-SR) 180 MG CR tablet Take 1 tablet (180 mg total) by mouth daily. 08/03/17 08/03/18  Ledell Noss, MD  VICTOZA 18 MG/3ML SOPN INJECT 0.2ML (1.2MG  TOTAL) INTO THE SKIN DAILY 07/05/17   Axel Filler, MD    Family History Family History  Problem Relation Age of Onset  . Diabetes Mother   . Lung cancer Maternal Uncle   . Breast cancer Maternal Grandmother   . Liver cancer Maternal Grandfather   . Heart disease Brother   . Colon cancer Neg Hx     Social History Social History  Substance Use Topics  . Smoking status: Never Smoker  . Smokeless tobacco: Never Used  . Alcohol use No     Allergies   Ace inhibitors; Amitriptyline hcl; and Prednisone   Review of Systems Review of Systems  Constitutional: Negative for chills and fever.  Gastrointestinal: Negative for abdominal pain, diarrhea, nausea and vomiting.  Musculoskeletal: Positive for arthralgias, back pain, gait problem and myalgias. Negative for neck pain and neck stiffness.  Skin: Negative for rash.  Allergic/Immunologic: Positive for immunocompromised state.  Neurological: Positive for numbness. Negative for weakness and headaches.   Physical Exam Updated Vital Signs BP 130/73   Pulse 70   Temp 98.3 F (36.8 C) (Oral)   Resp 16   LMP 02/15/2009   SpO2 99%   Physical Exam  Constitutional: No distress.  HENT:  Head: Normocephalic.  Eyes: Conjunctivae are normal.  Neck: Neck supple.  Cardiovascular: Normal rate and regular rhythm.  Exam reveals no gallop and no friction rub.   No murmur heard. Pulmonary/Chest: Effort normal. No respiratory distress.  Abdominal: Soft. She exhibits no distension.  Musculoskeletal:  Tender to palpation over the spinous processes of the lumbar spine with left sided paraspinal muscle tenderness. No right-sided paraspinal muscle tenderness. No tenderness to  palpation over the spinous processes of the thoracic or cervical spine or surrounding paraspinal muscles. Able to bear weight on the bilateral lower extremities. Symmetric, antalgic gait. Sensation is intact breath bilateral lower extremities. 2+ PT and PT pulses, which are symmetric. Negative Babinski.   Neurological: She is alert.  Skin: Skin is warm. No rash noted.  No overlying warmth, erythema, edema, or rashes to the skin overlying the mid to low back.  Psychiatric: Her behavior is normal.  Nursing note and vitals reviewed.    ED Treatments / Results  Labs (all labs ordered are listed, but only abnormal results are displayed) Labs Reviewed - No data to display  EKG  EKG Interpretation None       Radiology Dg Lumbar Spine Complete  Result Date: 09/08/2017 CLINICAL DATA:  Low back pain EXAM: LUMBAR SPINE - COMPLETE 4+ VIEW COMPARISON:  04/29/2015 FINDINGS: Lumbar alignment  is within normal limits. Mild degenerative disc changes at L3-L4, slight progression compared to prior radiographs. Vertebral body heights are maintained. Small anterior osteophytes from L2 through L4. IMPRESSION: 1. No acute osseous abnormality 2. Slight progression of degenerative changes at L3-L4 Electronically Signed   By: Donavan Foil M.D.   On: 09/08/2017 20:21   Dg Hip Unilat W Or Wo Pelvis 2-3 Views Left  Result Date: 09/08/2017 CLINICAL DATA:  Left hip pain EXAM: DG HIP (WITH OR WITHOUT PELVIS) 2-3V LEFT COMPARISON:  None. FINDINGS: SI joints are symmetric. Pubic symphysis and rami appear within normal limits. No fracture or malalignment. Joint spaces are maintained. IMPRESSION: Negative. Electronically Signed   By: Donavan Foil M.D.   On: 09/08/2017 20:22    Procedures Procedures (including critical care time)  Medications Ordered in ED Medications  HYDROcodone-acetaminophen (NORCO/VICODIN) 5-325 MG per tablet 1 tablet (1 tablet Oral Given 09/08/17 1947)     Initial Impression / Assessment and  Plan / ED Course  I have reviewed the triage vital signs and the nursing notes.  Pertinent labs & imaging results that were available during my care of the patient were reviewed by me and considered in my medical decision making (see chart for details).     61 year old female presenting with atraumatic low back pain that radiates down the left leg with associated numbness. No low back pain red flags. Imaging not concerning for spinal cord compression. Slight progression of degenerative changes at L3-L4. Pain controlled in the emergency department. The patient is scheduled to follow up with her primary care provider in 6 days. Discussed with the patient that if her symptoms are not starting to improve by this point that she should discuss her symptoms with her primary care provider. She is established with Dr. Ellene Route with neurosurgery; referral provided if her symptoms worsen or do not start to improve over the next few weeks. Encourage the patient to start stretching to improve her sciatic nerve pain. Will discharge the patient with symptomatically treatment including Tylenol and Flexeril, since the patient is unable to take NSAIDs due to CKD. Strict return precautions given, including fever, urinary or fecal incontinence, and lower extremity weakness. Vital signs stable. No acute distress. The patient is safe for discharge at this time.  Final Clinical Impressions(s) / ED Diagnoses   Final diagnoses:  Acute midline low back pain with left-sided sciatica    New Prescriptions New Prescriptions   CYCLOBENZAPRINE (FLEXERIL) 5 MG TABLET    Take 1 tablet (5 mg total) by mouth 2 (two) times daily as needed for muscle spasms.     Joline Maxcy A, PA-C 09/08/17 2057    Sherwood Gambler, MD 09/08/17 [id number]

## 2017-09-09 ENCOUNTER — Other Ambulatory Visit: Payer: Self-pay

## 2017-09-09 DIAGNOSIS — H25013 Cortical age-related cataract, bilateral: Secondary | ICD-10-CM | POA: Diagnosis not present

## 2017-09-09 NOTE — Patient Outreach (Signed)
Outreach patient after ED visit on October 3rd.  Spoke with patient and verified that PCP on file is still her PCP.  Patient stated she did not call her PCP prior to going to the ED because she was experiencing to much pain.  She has a follow up appointment scheduled and does know how to reach PCP after hours.  I explained [facility] services to the patient and also told her about the [address].  She was not in a position to make note of the number, I let her know that I would mail it out to her with information in regards to [facility] and services.  I asked the patient would she like a follow up call from one of my team members to further explain our services and if so we would have to go over a few questions.  She so no not at this time the information being sent would be enough.  Engagement tool not completed during this call.

## 2017-09-10 DIAGNOSIS — H25811 Combined forms of age-related cataract, right eye: Secondary | ICD-10-CM | POA: Diagnosis not present

## 2017-09-10 DIAGNOSIS — H2511 Age-related nuclear cataract, right eye: Secondary | ICD-10-CM | POA: Diagnosis not present

## 2017-09-10 DIAGNOSIS — H2512 Age-related nuclear cataract, left eye: Secondary | ICD-10-CM | POA: Diagnosis not present

## 2017-09-13 NOTE — Progress Notes (Signed)
CC: follow up of diabetes   HPI:  [name] is a 61 y.o. with PMH HTN, T2DM, severe obesity, depression, CKD 2, GERD, and chronic hepatitis C  who presents for follow up of htn, T2DM, and severe obesity. Please see the assessment and plans for the status of the patient chronic medical problems.   Past Medical History:  Diagnosis Date  . Carpal tunnel syndrome, bilateral    confirmed on nerve conduction studies  . Diabetes mellitus type 2, uncontrolled ([place])   . Diabetic peripheral neuropathy ([place])   . GERD (gastroesophageal reflux disease)   . History of endometriosis   . History of Helicobacter infection 11/07  . Hyperlipidemia    Review of Systems: Refer to history of present illness and assessment and plans for pertinent review of systems, all others reviewed and negative  Physical Exam:  Vitals:   09/14/17 1513  BP: 136/75  Pulse: 70  Temp: 98.1 F (36.7 C)  TempSrc: Oral  SpO2: 100%  Weight: 177 lb 14.4 oz (80.7 kg)  Height: 4\' 10"  (1.473 m)   General: well appearing, no acute distress  Cardiac: RRR, no appreciable murmur  Pulm: clear to auscultation bilateral, no wheezes or rales  GI: Soft, nontender, non distended  Skin: Feet are well groomed, no obvious callouses or signs of fungal infection  Assessment & Plan:   Cerumen impaction  Reports a feeling of fullness in both of her ears, made worse when she lies down on either side. On my exam she had thick bilateral ear wax, right moreso than left to the point that the right tympanic membrane could not be visualized. We attempted to irrigate her ears in the office but unfortunately we were not able to because her right ear canal was narrow and it became painful and she request referral to ENT instead. I am hesitant to give cerumenolytics because I could not appreciate both TM and evaluate for perforation but will plan to start these at follow up if ENT has not already done so. Denies fever, chills or pain with  manipulation of the tragus or helix.  - Referral to ENT for irrigation  HTN  BP Readings from Last 3 Encounters:  09/14/17 136/75  09/08/17 (!) 160/84  08/03/17 130/80  At last visit verapamil 180 mg qd was started and HCTZ was stopped. BP well controlled today.  -continue verapamil 180 mg daily and losartan 100 mg qd - BMP today   T2DM  A1c today 6.0. She did not bring her meter today but reports two low blood glucose readings in the last few months- one was symptomatic CBG 52 and the other was asymptomatic CBG 68, she responded to both with drinking orange juice. Reports that she continues to take extra lantus at times " about 2-3 units." I warned her of the risk of this, and the worst possibility being collapse.  - continue victoza 1.2 mg qd, decreased to lantus 35 units qHS (previously taking 40 units daily)   Healthcare maintenance  - received flu vaccination today   Severe obesity  Multiple comorbidities including T2DM, HTN and GERD. She continues to exercise with her mother at the gym and attempts to eat healthy, unfortunately, her weight continues to increase. I discussed with her the option of structured eating plan and the option for bariatric surgery. She does not feel that surgery is her best option at this time but would like to find out more information about the option.  - provided information for  bariatric surgery classes at [facility].  - continue liraglutide.   See Encounters Tab for problem based charting.  Patient discussed with Dr. Eppie Gibson

## 2017-09-14 ENCOUNTER — Encounter (INDEPENDENT_AMBULATORY_CARE_PROVIDER_SITE_OTHER): Payer: Self-pay

## 2017-09-14 ENCOUNTER — Ambulatory Visit (INDEPENDENT_AMBULATORY_CARE_PROVIDER_SITE_OTHER): Payer: Medicare Other | Admitting: Internal Medicine

## 2017-09-14 VITALS — BP 136/75 | HR 70 | Temp 98.1°F | Ht <= 58 in | Wt 177.9 lb

## 2017-09-14 DIAGNOSIS — I10 Essential (primary) hypertension: Secondary | ICD-10-CM

## 2017-09-14 DIAGNOSIS — E11319 Type 2 diabetes mellitus with unspecified diabetic retinopathy without macular edema: Secondary | ICD-10-CM | POA: Diagnosis not present

## 2017-09-14 DIAGNOSIS — H6123 Impacted cerumen, bilateral: Secondary | ICD-10-CM | POA: Diagnosis not present

## 2017-09-14 DIAGNOSIS — E1122 Type 2 diabetes mellitus with diabetic chronic kidney disease: Secondary | ICD-10-CM | POA: Diagnosis not present

## 2017-09-14 DIAGNOSIS — N182 Chronic kidney disease, stage 2 (mild): Secondary | ICD-10-CM

## 2017-09-14 DIAGNOSIS — I129 Hypertensive chronic kidney disease with stage 1 through stage 4 chronic kidney disease, or unspecified chronic kidney disease: Secondary | ICD-10-CM | POA: Diagnosis not present

## 2017-09-14 DIAGNOSIS — Z6837 Body mass index (BMI) 37.0-37.9, adult: Secondary | ICD-10-CM | POA: Diagnosis not present

## 2017-09-14 DIAGNOSIS — Z23 Encounter for immunization: Secondary | ICD-10-CM | POA: Diagnosis not present

## 2017-09-14 DIAGNOSIS — Z794 Long term (current) use of insulin: Secondary | ICD-10-CM | POA: Diagnosis not present

## 2017-09-14 LAB — POCT GLYCOSYLATED HEMOGLOBIN (HGB A1C): Hemoglobin A1C: 6

## 2017-09-14 LAB — GLUCOSE, CAPILLARY: Glucose-Capillary: 78 mg/dL (ref 65–99)

## 2017-09-14 MED ORDER — INSULIN GLARGINE 100 UNIT/ML ~~LOC~~ SOLN: 35.0000 [IU] | Freq: Every day | SUBCUTANEOUS | 3 refills | Status: DC

## 2017-09-14 NOTE — Patient Instructions (Addendum)
It was a pleasure to see you today patient - For your diabetes, start taking lantus 35 units daily (you were previously taking 40) your hemoglobin A1c is still low today and this can be dangerous because you can't always sense your low blood sugar. Continue taking Victoza 1.2 mg daily, bring her meter to your next visit  - please consider calling the bariatric surgery clinic  - for your high blood pressure, keep up the good work with taking your medications, continue to eat a low salt diet  - Please call our clinic if you have any problems or questions, we may be able to help you and keep you from a long emergency room wait. Our clinic and after hours phone number is [phone]   DASH Eating Plan [name] stands for "Dietary Approaches to Stop Hypertension." The DASH eating plan is a healthy eating plan that has been shown to reduce high blood pressure (hypertension). It may also reduce your risk for type 2 diabetes, heart disease, and stroke. The DASH eating plan may also help with weight loss. What are tips for following this plan? General guidelines  Avoid eating more than 2,300 mg (milligrams) of salt (sodium) a day. If you have hypertension, you may need to reduce your sodium intake to 1,500 mg a day.  Limit alcohol intake to no more than 1 drink a day for nonpregnant women and 2 drinks a day for men. One drink equals 12 oz of beer, 5 oz of wine, or 1 oz of hard liquor.  Work with your health care provider to maintain a healthy body weight or to lose weight. Ask what an ideal weight is for you.  Get at least 30 minutes of exercise that causes your heart to beat faster (aerobic exercise) most days of the week. Activities may include walking, swimming, or biking.  Work with your health care provider or diet and nutrition specialist (dietitian) to adjust your eating plan to your individual calorie needs. Reading food labels  Check food labels for the amount of sodium per serving. Choose foods  with less than 5 percent of the Daily Value of sodium. Generally, foods with less than 300 mg of sodium per serving fit into this eating plan.  To find whole grains, look for the word "whole" as the first word in the ingredient list. Shopping  Buy products labeled as "low-sodium" or "no salt added."  Buy fresh foods. Avoid canned foods and premade or frozen meals. Cooking  Avoid adding salt when cooking. Use salt-free seasonings or herbs instead of table salt or sea salt. Check with your health care provider or pharmacist before using salt substitutes.  Do not fry foods. Cook foods using healthy methods such as baking, boiling, grilling, and broiling instead.  Cook with heart-healthy oils, such as olive, canola, soybean, or sunflower oil. Meal planning   Eat a balanced diet that includes: ? 5 or more servings of fruits and vegetables each day. At each meal, try to fill half of your plate with fruits and vegetables. ? Up to 6-8 servings of whole grains each day. ? Less than 6 oz of lean meat, poultry, or fish each day. A 3-oz serving of meat is about the same size as a deck of cards. One egg equals 1 oz. ? 2 servings of low-fat dairy each day. ? A serving of nuts, seeds, or beans 5 times each week. ? Heart-healthy fats. Healthy fats called Omega-3 fatty acids are found in foods such as flaxseeds  and coldwater fish, like sardines, salmon, and mackerel.  Limit how much you eat of the following: ? Canned or prepackaged foods. ? Food that is high in trans fat, such as fried foods. ? Food that is high in saturated fat, such as fatty meat. ? Sweets, desserts, sugary drinks, and other foods with added sugar. ? Full-fat dairy products.  Do not salt foods before eating.  Try to eat at least 2 vegetarian meals each week.  Eat more home-cooked food and less restaurant, buffet, and fast food.  When eating at a restaurant, ask that your food be prepared with less salt or no salt, if  possible. What foods are recommended? The items listed may not be a complete list. Talk with your dietitian about what dietary choices are best for you. Grains Whole-grain or whole-wheat bread. Whole-grain or whole-wheat pasta. Brown rice. Nancy [name]. Bulgur. Whole-grain and low-sodium cereals. Pita bread. Low-fat, low-sodium crackers. Whole-wheat flour tortillas. Vegetables Fresh or frozen vegetables (raw, steamed, roasted, or grilled). Low-sodium or reduced-sodium tomato and vegetable juice. Low-sodium or reduced-sodium tomato sauce and tomato paste. Low-sodium or reduced-sodium canned vegetables. Fruits All fresh, dried, or frozen fruit. Canned fruit in natural juice (without added sugar). Meat and other protein foods Skinless chicken or Nancy [name]. Ground chicken or Nancy [name]. Pork with fat trimmed off. Fish and seafood. Egg whites. Dried beans, peas, or lentils. Unsalted nuts, nut butters, and seeds. Unsalted canned beans. Lean cuts of beef with fat trimmed off. Low-sodium, lean deli meat. Dairy Low-fat (1%) or fat-free (skim) milk. Fat-free, low-fat, or reduced-fat cheeses. Nonfat, low-sodium ricotta or cottage cheese. Low-fat or nonfat yogurt. Low-fat, low-sodium cheese. Fats and oils Soft margarine without trans fats. Vegetable oil. Low-fat, reduced-fat, or light mayonnaise and salad dressings (reduced-sodium). Canola, safflower, olive, soybean, and sunflower oils. Avocado. Seasoning and other foods Herbs. Spices. Seasoning mixes without salt. Unsalted popcorn and pretzels. Fat-free sweets. What foods are not recommended? The items listed may not be a complete list. Talk with your dietitian about what dietary choices are best for you. Grains Baked goods made with fat, such as croissants, muffins, or some breads. Dry pasta or rice meal packs. Vegetables Creamed or fried vegetables. Vegetables in a cheese sauce. Regular canned vegetables (not low-sodium or reduced-sodium). Regular canned  tomato sauce and paste (not low-sodium or reduced-sodium). Regular tomato and vegetable juice (not low-sodium or reduced-sodium). Nancy [name]. Olives. Fruits Canned fruit in a light or heavy syrup. Fried fruit. Fruit in cream or butter sauce. Meat and other protein foods Fatty cuts of meat. Ribs. Fried meat. Berniece Salines. Sausage. Bologna and other processed lunch meats. Salami. Fatback. Hotdogs. Bratwurst. Salted nuts and seeds. Canned beans with added salt. Canned or smoked fish. Whole eggs or egg yolks. Chicken or Nancy [name] with skin. Dairy Whole or 2% milk, cream, and half-and-half. Whole or full-fat cream cheese. Whole-fat or sweetened yogurt. Full-fat cheese. Nondairy creamers. Whipped toppings. Processed cheese and cheese spreads. Fats and oils Butter. Stick margarine. Lard. Shortening. Ghee. Bacon fat. Tropical oils, such as coconut, palm kernel, or palm oil. Seasoning and other foods Salted popcorn and pretzels. Onion salt, garlic salt, seasoned salt, table salt, and sea salt. Worcestershire sauce. Tartar sauce. Barbecue sauce. Teriyaki sauce. Soy sauce, including reduced-sodium. Steak sauce. Canned and packaged gravies. Fish sauce. Oyster sauce. Cocktail sauce. Horseradish that you find on the shelf. Ketchup. Mustard. Meat flavorings and tenderizers. Bouillon cubes. Hot sauce and Tabasco sauce. Premade or packaged marinades. Premade or packaged taco seasonings. Relishes. Regular salad dressings. Where to  find more information:  National Heart, Lung, and Blood Institute: https://wilson-eaton.com/  American Heart Association: www.heart.org Summary  The DASH eating plan is a healthy eating plan that has been shown to reduce high blood pressure (hypertension). It may also reduce your risk for type 2 diabetes, heart disease, and stroke.  With the DASH eating plan, you should limit salt (sodium) intake to 2,300 mg a day. If you have hypertension, you may need to reduce your sodium intake to 1,500 mg a day.  When  on the DASH eating plan, aim to eat more fresh fruits and vegetables, whole grains, lean proteins, low-fat dairy, and heart-healthy fats.  Work with your health care provider or diet and nutrition specialist (dietitian) to adjust your eating plan to your individual calorie needs. This information is not intended to replace advice given to you by your health care provider. Make sure you discuss any questions you have with your health care provider. Document Released: 11/12/2011 Document Revised: 11/16/2016 Document Reviewed: 11/16/2016 Elsevier Interactive Patient Education  2017 [employer].

## 2017-09-15 LAB — BMP8+ANION GAP
ANION GAP: 15 mmol/L (ref 10.0–18.0)
BUN/Creatinine Ratio: 11 (ref 9–23)
BUN: 12 mg/dL (ref 6–24)
CHLORIDE: 103 mmol/L (ref 96–106)
CO2: 25 mmol/L (ref 20–29)
CREATININE: 1.1 mg/dL — AB (ref 0.57–1.00)
Calcium: 9 mg/dL (ref 8.7–10.2)
GFR calc Af Amer: 64 mL/min/{1.73_m2} (ref >59)
GFR calc non Af Amer: 55 mL/min/{1.73_m2} — ABNORMAL LOW (ref >59)
GLUCOSE: 72 mg/dL (ref 65–99)
POTASSIUM: 3.3 mmol/L — AB (ref 3.5–5.2)
SODIUM: 143 mmol/L (ref 134–144)

## 2017-09-15 NOTE — Assessment & Plan Note (Signed)
BP Readings from Last 3 Encounters:  09/14/17 136/75  09/08/17 (!) 160/84  08/03/17 130/80  At last visit verapamil 180 mg qd was started and HCTZ was stopped. BP well controlled today.  -continue verapamil 180 mg daily and losartan 100 mg qd - BMP today

## 2017-09-15 NOTE — Assessment & Plan Note (Signed)
Reports a feeling of fullness in both of her ears, made worse when she lies down on either side. On my exam she had thick bilateral ear wax, right moreso than left to the point that the right tympanic membrane could not be visualized. We attempted to irrigate her ears in the office but unfortunately we were not able to because her right ear canal was narrow and it became painful and she request referral to ENT instead. I am hesitant to give cerumenolytics because I could not appreciate both TM and evaluate for perforation but will plan to start these at follow up if ENT has not already done so. Denies fever, chills or pain with manipulation of the tragus or helix.  - Referral to ENT for irrigation

## 2017-09-15 NOTE — Assessment & Plan Note (Signed)
Multiple comorbidities including T2DM, HTN and GERD. She continues to exercise with her mother at the gym and attempts to eat healthy, unfortunately, her weight continues to increase. I discussed with her the option of structured eating plan and the option for bariatric surgery. She does not feel that surgery is her best option at this time but would like to find out more information about the option.  - provided information for bariatric surgery classes at [facility].  - continue liraglutide.

## 2017-09-15 NOTE — Assessment & Plan Note (Signed)
A1c today 6.0. She did not bring her meter today but reports two low blood glucose readings in the last few months- one was symptomatic CBG 52 and the other was asymptomatic CBG 68, she responded to both with drinking orange juice. Reports that she continues to take extra lantus at times " about 2-3 units." I warned her of the risk of this, and the worst possibility being collapse.   - continue victoza 1.2 mg qd, decreased to lantus 35 units qHS (previously taking 40 units daily)

## 2017-09-16 NOTE — Addendum Note (Signed)
Addended by: Meryl Dare on: 09/16/2017 11:29 AM   Modules accepted: Orders

## 2017-09-17 NOTE — Progress Notes (Signed)
Case discussed with Dr. Blum soon after the resident saw the patient.  We reviewed the resident's history and exam and pertinent patient test results.  I agree with the assessment, diagnosis and plan of care documented in the resident's note. 

## 2017-09-21 ENCOUNTER — Telehealth: Payer: Self-pay | Admitting: Internal Medicine

## 2017-09-21 NOTE — Telephone Encounter (Signed)
Patient called c/o joint/ muscle pain when getting up or moving. Went to ED 09/08/17 & was given Flexeril w/c she takes  PRN @ night. Can not take flexeril during the day bec it slows her down.  Last OV 09/14/17 & stated she had mentioned abt these pains from her arthritis.  Now she's asking for something to help her pain that will not cause her drowsiness. She refused to come to be seen & said "why should I come there again when I was just seen about this pain. I just need something to help me move without having all these spasms".  Pls advise!

## 2017-09-21 NOTE — Telephone Encounter (Signed)
Patient would like the Dr Hetty Ely to prescribe for pain, muscle spasms and she is in a lot of pain. Pls call patient back

## 2017-09-24 DIAGNOSIS — H25812 Combined forms of age-related cataract, left eye: Secondary | ICD-10-CM | POA: Diagnosis not present

## 2017-09-24 DIAGNOSIS — H2512 Age-related nuclear cataract, left eye: Secondary | ICD-10-CM | POA: Diagnosis not present

## 2017-09-24 DIAGNOSIS — H25013 Cortical age-related cataract, bilateral: Secondary | ICD-10-CM | POA: Diagnosis not present

## 2017-09-24 NOTE — Telephone Encounter (Signed)
Thank you, I have spoken with Nancy [name], she has decided that we will start with a trial of tylenol and icey hot as needed for the muscle spasm. I explained that if this does not control her pain I will need to examine her back and create a new plan.   She also mentioned constipation which does not always respond as she would like to senna. I recommended that she try metamucil for this.

## 2017-10-12 DIAGNOSIS — H6123 Impacted cerumen, bilateral: Secondary | ICD-10-CM | POA: Diagnosis not present

## 2017-10-12 DIAGNOSIS — H900 Conductive hearing loss, bilateral: Secondary | ICD-10-CM | POA: Diagnosis not present

## 2017-11-08 ENCOUNTER — Emergency Department (HOSPITAL_COMMUNITY)
Admission: EM | Admit: 2017-11-08 | Discharge: 2017-11-08 | Disposition: A | Discharge status: Home / Self Care | Payer: Medicare Other | Attending: Emergency Medicine | Admitting: Emergency Medicine

## 2017-11-08 ENCOUNTER — Other Ambulatory Visit: Payer: Self-pay

## 2017-11-08 ENCOUNTER — Encounter (HOSPITAL_COMMUNITY): Payer: Self-pay

## 2017-11-08 DIAGNOSIS — Z91040 Latex allergy status: Secondary | ICD-10-CM | POA: Diagnosis not present

## 2017-11-08 DIAGNOSIS — E1122 Type 2 diabetes mellitus with diabetic chronic kidney disease: Secondary | ICD-10-CM | POA: Diagnosis not present

## 2017-11-08 DIAGNOSIS — Z794 Long term (current) use of insulin: Secondary | ICD-10-CM | POA: Diagnosis not present

## 2017-11-08 DIAGNOSIS — R197 Diarrhea, unspecified: Secondary | ICD-10-CM | POA: Insufficient documentation

## 2017-11-08 DIAGNOSIS — R404 Transient alteration of awareness: Secondary | ICD-10-CM | POA: Diagnosis not present

## 2017-11-08 DIAGNOSIS — E1140 Type 2 diabetes mellitus with diabetic neuropathy, unspecified: Secondary | ICD-10-CM | POA: Insufficient documentation

## 2017-11-08 DIAGNOSIS — N289 Disorder of kidney and ureter, unspecified: Secondary | ICD-10-CM

## 2017-11-08 DIAGNOSIS — Z79899 Other long term (current) drug therapy: Secondary | ICD-10-CM | POA: Diagnosis not present

## 2017-11-08 DIAGNOSIS — N182 Chronic kidney disease, stage 2 (mild): Secondary | ICD-10-CM | POA: Insufficient documentation

## 2017-11-08 DIAGNOSIS — E11319 Type 2 diabetes mellitus with unspecified diabetic retinopathy without macular edema: Secondary | ICD-10-CM | POA: Diagnosis not present

## 2017-11-08 DIAGNOSIS — I129 Hypertensive chronic kidney disease with stage 1 through stage 4 chronic kidney disease, or unspecified chronic kidney disease: Secondary | ICD-10-CM | POA: Diagnosis not present

## 2017-11-08 DIAGNOSIS — R55 Syncope and collapse: Secondary | ICD-10-CM | POA: Diagnosis not present

## 2017-11-08 DIAGNOSIS — N189 Chronic kidney disease, unspecified: Secondary | ICD-10-CM | POA: Diagnosis not present

## 2017-11-08 LAB — URINALYSIS, ROUTINE W REFLEX MICROSCOPIC
BILIRUBIN URINE: NEGATIVE
GLUCOSE, UA: 50 mg/dL — AB
KETONES UR: NEGATIVE mg/dL
LEUKOCYTES UA: NEGATIVE
Nitrite: NEGATIVE
PH: 5 (ref 5.0–8.0)
Protein, ur: >=300 mg/dL — AB
Specific Gravity, Urine: 1.026 (ref 1.005–1.030)

## 2017-11-08 LAB — COMPREHENSIVE METABOLIC PANEL
ALBUMIN: 2.7 g/dL — AB (ref 3.5–5.0)
ALK PHOS: 82 U/L (ref 38–126)
ALT: 41 U/L (ref 14–54)
ANION GAP: 8 (ref 5–15)
AST: 68 U/L — ABNORMAL HIGH (ref 15–41)
BUN: 12 mg/dL (ref 6–20)
CALCIUM: 7.8 mg/dL — AB (ref 8.9–10.3)
CO2: 26 mmol/L (ref 22–32)
CREATININE: 1.86 mg/dL — AB (ref 0.44–1.00)
Chloride: 100 mmol/L — ABNORMAL LOW (ref 101–111)
GFR calc Af Amer: 33 mL/min — ABNORMAL LOW (ref >60)
GFR calc non Af Amer: 28 mL/min — ABNORMAL LOW (ref >60)
GLUCOSE: 100 mg/dL — AB (ref 65–99)
Potassium: 2.6 mmol/L — CL (ref 3.5–5.1)
SODIUM: 134 mmol/L — AB (ref 135–145)
Total Bilirubin: 0.7 mg/dL (ref 0.3–1.2)
Total Protein: 6.6 g/dL (ref 6.5–8.1)

## 2017-11-08 LAB — I-STAT CHEM 8, ED
BUN: 13 mg/dL (ref 6–20)
CHLORIDE: 97 mmol/L — AB (ref 101–111)
Calcium, Ion: 1 mmol/L — ABNORMAL LOW (ref 1.15–1.40)
Creatinine, Ser: 1.8 mg/dL — ABNORMAL HIGH (ref 0.44–1.00)
Glucose, Bld: 92 mg/dL (ref 65–99)
HEMATOCRIT: 33 % — AB (ref 36.0–46.0)
Hemoglobin: 11.2 g/dL — ABNORMAL LOW (ref 12.0–15.0)
POTASSIUM: 3.2 mmol/L — AB (ref 3.5–5.1)
SODIUM: 138 mmol/L (ref 135–145)
TCO2: 28 mmol/L (ref 22–32)

## 2017-11-08 LAB — HEPATIC FUNCTION PANEL
ALBUMIN: 2.4 g/dL — AB (ref 3.5–5.0)
ALK PHOS: 78 U/L (ref 38–126)
ALT: 43 U/L (ref 14–54)
AST: 74 U/L — AB (ref 15–41)
BILIRUBIN TOTAL: 1.8 mg/dL — AB (ref 0.3–1.2)
Bilirubin, Direct: 0.5 mg/dL (ref 0.1–0.5)
Indirect Bilirubin: 1.3 mg/dL — ABNORMAL HIGH (ref 0.3–0.9)
TOTAL PROTEIN: 5.9 g/dL — AB (ref 6.5–8.1)

## 2017-11-08 LAB — CBC
HEMATOCRIT: 34.3 % — AB (ref 36.0–46.0)
HEMOGLOBIN: 11.7 g/dL — AB (ref 12.0–15.0)
MCH: 26.4 pg (ref 26.0–34.0)
MCHC: 34.1 g/dL (ref 30.0–36.0)
MCV: 77.4 fL — AB (ref 78.0–100.0)
Platelets: 197 10*3/uL (ref 150–400)
RBC: 4.43 MIL/uL (ref 3.87–5.11)
RDW: 14.7 % (ref 11.5–15.5)
WBC: 8.7 10*3/uL (ref 4.0–10.5)

## 2017-11-08 LAB — I-STAT TROPONIN, ED: Troponin i, poc: 0.01 ng/mL (ref 0.00–0.08)

## 2017-11-08 LAB — CBG MONITORING, ED: Glucose-Capillary: 86 mg/dL (ref 65–99)

## 2017-11-08 MED ORDER — SODIUM CHLORIDE 0.9 % IV BOLUS (SEPSIS)
1000.0000 mL | Freq: Once | INTRAVENOUS | Status: AC
  Administered 2017-11-08: 1000 mL via INTRAVENOUS

## 2017-11-08 NOTE — Discharge Instructions (Signed)
Return to the ED with any concerns including chest pain, recurrent fainting, difficulty breathing, abdominal pain, vomiting and not able to keep down liquids, decreased level of alertness/lethargy, or any other alarming symptoms  Your creatinine blood test (which is a measure of kidney function) is increased compared to the last value- you should be sure to have this rechecked by your primary care doctor

## 2017-11-08 NOTE — ED Notes (Signed)
Pt placed in hall bed in sight of nurses' station, family bedside. Pt currently has no complaints

## 2017-11-08 NOTE — ED Notes (Addendum)
Pt CBG 86 RN notified.

## 2017-11-08 NOTE — ED Notes (Signed)
PT tolerating chicken broth and water

## 2017-11-08 NOTE — ED Notes (Signed)
Pt states she did not have any water to drink today, has not been able to keep food down without having diarrhea or vomiting x2 days.

## 2017-11-08 NOTE — ED Notes (Signed)
Pt provided with ice water and chicken broth at this time

## 2017-11-08 NOTE — ED Triage Notes (Signed)
Pt arrives to ED via EMS after syncopal episode lasting 30 seconds while standing in line at the bank since 1500 this afternoon. Pt states she has had n/v/d x4 days and this was her first day out and about. Pt denies n/v today. Pt placed in position of comfort with bed locked and lowered, call bell in reach.

## 2017-11-08 NOTE — ED Notes (Signed)
Pt stood for Orthostatic VS with minimal assistance. Pt remains steady on feet. No light-headed feelings.

## 2017-11-08 NOTE — ED Notes (Signed)
Patient verbalizes understanding of discharge instructions. Opportunity for questioning and answers were provided. 

## 2017-11-08 NOTE — ED Provider Notes (Signed)
[facility] EMERGENCY DEPARTMENT Provider Note   CSN: [id number] Arrival date & time: 11/08/17  1600     History   Chief Complaint Chief Complaint  Patient presents with  . Loss of Consciousness    HPI Nancy [name] is a 61 y.o. female.  HPI  Patient with history of diabetes presenting with complaint of syncope.  She has had nausea vomiting and diarrhea for the past 2 days.  She has been feeling lightheaded upon standing.  She went to the bank and was waiting in line and then had a syncopal event after feeling very lightheaded.  She denies any chest pain or shortness of breath.  There is no report of any seizure activity and patient returned to her baseline immediately upon awakening.  She has had watery diarrhea and nausea with some vomiting after any p.o. intake.  No fever.  No abdominal pain separate from the diarrhea.  She has been drinking water but has not been eating very well.  Yesterday her blood sugar was 361.  No specific sick contacts or recent travel.  There are no other associated systemic symptoms, there are no other alleviating or modifying factors.   Past Medical History:  Diagnosis Date  . Carpal tunnel syndrome, bilateral    confirmed on nerve conduction studies  . Diabetes mellitus type 2, uncontrolled ([place])   . Diabetic peripheral neuropathy ([place])   . GERD (gastroesophageal reflux disease)   . History of endometriosis   . History of Helicobacter infection 11/07  . Hyperlipidemia     Patient Active Problem List   Diagnosis Date Noted  . Morbid obesity ([place]) 08/04/2017  . CKD (chronic kidney disease) stage 2, GFR 60-89 ml/min 08/02/2017  . Diabetic nephropathy ([place]) 11/06/2016  . Other constipation 09/08/2016  . Liver fibrosis 02/17/2016  . Chronic hepatitis C without hepatic coma ([place]) 09/12/2015  . Health care maintenance 10/03/2014  . Cerumen impaction 10/03/2014  . GERD (gastroesophageal reflux disease) 01/16/2014  . Carpal  tunnel syndrome, bilateral   . Essential hypertension 11/09/2006  . Controlled diabetes mellitus with retinopathy ([place]) 12/07/2002    Past Surgical History:  Procedure Laterality Date  . ABDOMINAL HYSTERECTOMY    . CARPAL TUNNEL RELEASE      OB History    No data available       Home Medications    Prior to Admission medications   Medication Sig Start Date End Date Taking? Authorizing Provider  atorvastatin (LIPITOR) 40 MG tablet Take 40 mg by mouth daily.   Yes [provider]  cyclobenzaprine (FLEXERIL) 5 MG tablet Take 1 tablet (5 mg total) by mouth 2 (two) times daily as needed for muscle spasms. 09/08/17  Yes McDonald, Mia A, PA-C  insulin glargine (LANTUS) 100 UNIT/ML injection Inject 0.35 mLs (35 Units total) into the skin daily. INJECT 40 UNITS INTO THE SKIN EVERY MORNING WHEN YOU WAKE UP Patient taking differently: Inject 40 Units into the skin daily before breakfast.  09/14/17  Yes Ledell Noss, MD  losartan (COZAAR) 100 MG tablet Take 1 tablet (100 mg total) by mouth daily. 08/03/17  Yes Ledell Noss, MD  omeprazole (PRILOSEC) 40 MG capsule Take 1 capsule (40 mg total) by mouth daily. 01/19/17  Yes Milus Banister, MD  senna (SENOKOT) 8.6 MG tablet Take 1 tablet (8.6 mg total) by mouth as needed for constipation. Patient taking differently: Take 2 tablets by mouth daily.  08/04/17  Yes Ledell Noss, MD  verapamil (CALAN-SR) 180 MG CR  tablet Take 1 tablet (180 mg total) by mouth daily. 08/03/17 08/03/18 Yes Ledell Noss, MD  VICTOZA 18 MG/3ML SOPN INJECT 0.2ML (1.2MG  TOTAL) INTO THE SKIN DAILY Patient taking differently: INJECT 0.2ML (1.2MG  TOTAL) INTO THE SKIN DAILY BEFORE BREAKFAST 07/05/17  Yes Axel Filler, MD  Wheat Dextrin (BENEFIBER PO) Take 1 tablet by mouth daily as needed (constipation).   Yes [provider]  ACCU-CHEK FASTCLIX LANCETS MISC Use to check blood sugar up to 3 times a day DX code 250.02 insulin requiring 11/06/16   Ledell Noss, MD    glucose blood (ACCU-CHEK SMARTVIEW) test strip USE TO CHECK BLOOD SUGAR 3 to 4 times daily. diag code E11.9. Insulin dependent 12/15/16   Ledell Noss, MD  Insulin Pen Needle (CAREFINE PEN NEEDLES) 31G X 8 MM MISC Please use with liraglutide. Use 0.6 mg once daily for a week. Thereafter, you will take 1.2 mg daily. 11/06/16   Ledell Noss, MD  Insulin Syringes, Disposable, U-100 1 ML MISC 2 Syringes by Does not apply route daily. Use to inject insulin into the skin 1 time daily. diag code E11.9. Insulin dependent 12/15/16   Ledell Noss, MD    Family History Family History  Problem Relation Age of Onset  . Diabetes Mother   . Lung cancer Maternal Uncle   . Breast cancer Maternal Grandmother   . Liver cancer Maternal Grandfather   . Heart disease Brother   . Colon cancer Neg Hx     Social History Social History   Tobacco Use  . Smoking status: Never Smoker  . Smokeless tobacco: Never Used  Substance Use Topics  . Alcohol use: No    Alcohol/week: 0.0 oz  . Drug use: No     Allergies   Ace inhibitors; Amitriptyline hcl; Latex; and Prednisone   Review of Systems Review of Systems  ROS reviewed and all otherwise negative except for mentioned in HPI   Physical Exam Updated Vital Signs BP 111/64 (BP Location: Right Arm)   Pulse 78   Temp 98.4 F (36.9 C) (Oral)   Resp 18   Ht 4\' 10"  (1.473 m)   Wt 79.8 kg (176 lb)   LMP 02/15/2009   SpO2 100%   BMI 36.78 kg/m  Vitals reviewed Physical Exam  Physical Examination: General appearance - alert, well appearing, and in no distress Mental status - alert, oriented to person, place, and time Eyes - no conjunctival injection, no scleral icterus Mouth - mucous membranes moist, pharynx normal without lesions Neck - supple, no significant adenopathy Chest - clear to auscultation, no wheezes, rales or rhonchi, symmetric air entry Heart - normal rate, regular rhythm, normal S1, S2, no murmurs, rubs, clicks or gallops Abdomen - soft,  nontender, nondistended, no masses or organomegaly, nabs Neurological - alert, oriented, normal speech, normal gait, strength and sensation intact in extremities x 4 Extremities - peripheral pulses normal, no pedal edema, no clubbing or cyanosis Skin - normal coloration and turgor, no rashes   ED Treatments / Results  Labs (all labs ordered are listed, but only abnormal results are displayed) Labs Reviewed  CBC - Abnormal; Notable for the following components:      Result Value   Hemoglobin 11.7 (*)    HCT 34.3 (*)    MCV 77.4 (*)    All other components within normal limits  URINALYSIS, ROUTINE W REFLEX MICROSCOPIC - Abnormal; Notable for the following components:   Color, Urine AMBER (*)    APPearance CLOUDY (*)  Glucose, UA 50 (*)    Hgb urine dipstick SMALL (*)    Protein, ur >=300 (*)    Bacteria, UA RARE (*)    Squamous Epithelial / LPF 6-30 (*)    All other components within normal limits  HEPATIC FUNCTION PANEL - Abnormal; Notable for the following components:   Total Protein 5.9 (*)    Albumin 2.4 (*)    AST 74 (*)    Total Bilirubin 1.8 (*)    Indirect Bilirubin 1.3 (*)    All other components within normal limits  I-STAT CHEM 8, ED - Abnormal; Notable for the following components:   Potassium 3.2 (*)    Chloride 97 (*)    Creatinine, Ser 1.80 (*)    Calcium, Ion 1.00 (*)    Hemoglobin 11.2 (*)    HCT 33.0 (*)    All other components within normal limits  COMPREHENSIVE METABOLIC PANEL  I-STAT TROPONIN, ED  CBG MONITORING, ED    EKG  EKG Interpretation  Date/Time:  Monday November 08 2017 16:07:31 EST Ventricular Rate:  65 PR Interval:  144 QRS Duration: 96 QT Interval:  364 QTC Calculation: 378 R Axis:   84 Text Interpretation:  Normal sinus rhythm Nonspecific T wave abnormality Abnormal ECG No significant change since last tracing Confirmed by Alfonzo Beers [phone]) on 11/08/2017 4:22:02 PM       Radiology No results  found.  Procedures Procedures (including critical care time)  Medications Ordered in ED Medications  sodium chloride 0.9 % bolus 1,000 mL (0 mLs Intravenous Stopped 11/08/17 1803)     Initial Impression / Assessment and Plan / ED Course  I have reviewed the triage vital signs and the nursing notes.  Pertinent labs & imaging results that were available during my care of the patient were reviewed by me and considered in my medical decision making (see chart for details).    Patient presenting after syncopal episode this afternoon.  She has had nausea vomiting and diarrhea for the past 2 days.  EKG and orthostatic vital signs are reviewed.  Her creatinine is somewhat elevated at 1.8 over her baseline of 1.3.  She was hydrated and her symptoms improved.  She denied any chest pain doubt cardiac cause for her syncope.  She was advised to follow-up with her primary care doctor for repeat of her creatinine and to continue hydration.  She was able to tolerate a p.o. trial in the ED without any vomiting abdominal exam is benign without tenderness.  There are no other associated systemic symptoms, there are no other alleviating or modifying factors.   Final Clinical Impressions(s) / ED Diagnoses   Final diagnoses:  Syncope, unspecified syncope type  Diarrhea, unspecified type  Renal insufficiency    ED Discharge Orders    None       Pixie Casino, MD 11/08/17 2048

## 2017-11-25 ENCOUNTER — Other Ambulatory Visit: Payer: Self-pay

## 2017-11-25 ENCOUNTER — Ambulatory Visit (HOSPITAL_COMMUNITY)
Admission: EM | Admit: 2017-11-25 | Discharge: 2017-11-25 | Disposition: A | Discharge status: Home / Self Care | Payer: Medicare Other | Attending: Family Medicine | Admitting: Family Medicine

## 2017-11-25 ENCOUNTER — Encounter (HOSPITAL_COMMUNITY): Payer: Self-pay | Admitting: Emergency Medicine

## 2017-11-25 DIAGNOSIS — L853 Xerosis cutis: Secondary | ICD-10-CM | POA: Diagnosis not present

## 2017-11-25 DIAGNOSIS — L299 Pruritus, unspecified: Secondary | ICD-10-CM

## 2017-11-25 MED ORDER — TRIAMCINOLONE 0.1 % CREAM:EUCERIN CREAM 1:1: 1.0000 "application " | TOPICAL_CREAM | Freq: Two times a day (BID) | CUTANEOUS | 0 refills | Status: DC

## 2017-11-25 MED ORDER — TRIAMCINOLONE ACETONIDE 0.1 % EX CREA: 1.0000 "application " | TOPICAL_CREAM | Freq: Two times a day (BID) | CUTANEOUS | 0 refills | Status: AC

## 2017-11-25 NOTE — ED Provider Notes (Signed)
[facility]    CSN: [id number] Arrival date & time: 11/25/17  1003     History   Chief Complaint Chief Complaint  Patient presents with  . Rash    HPI Nancy [name] is a 61 y.o. female history of Diabetes and HTN presenting with facial and ear itching for 3 weeks. Has felt itching around her eyes, ears and chin, occasionally her back. Has tried refresh eye drops, dry skin lotion, peroxide and stopped using soap on her face. Using benadryl. NO vision changes, no rash. Does feel like her skin around her eyes and forehead are darkening. NO spots on elbows, knees. No one else living with her at home. No new soaps, detergents.   HPI  Past Medical History:  Diagnosis Date  . Carpal tunnel syndrome, bilateral    confirmed on nerve conduction studies  . Diabetes mellitus type 2, uncontrolled ([place])   . Diabetic peripheral neuropathy ([place])   . GERD (gastroesophageal reflux disease)   . History of endometriosis   . History of Helicobacter infection 11/07  . Hyperlipidemia     Patient Active Problem List   Diagnosis Date Noted  . Morbid obesity ([place]) 08/04/2017  . CKD (chronic kidney disease) stage 2, GFR 60-89 ml/min 08/02/2017  . Diabetic nephropathy ([place]) 11/06/2016  . Other constipation 09/08/2016  . Liver fibrosis 02/17/2016  . Chronic hepatitis C without hepatic coma ([place]) 09/12/2015  . Health care maintenance 10/03/2014  . Cerumen impaction 10/03/2014  . GERD (gastroesophageal reflux disease) 01/16/2014  . Carpal tunnel syndrome, bilateral   . Essential hypertension 11/09/2006  . Controlled diabetes mellitus with retinopathy ([place]) 12/07/2002    Past Surgical History:  Procedure Laterality Date  . ABDOMINAL HYSTERECTOMY    . CARPAL TUNNEL RELEASE      OB History    No data available       Home Medications    Prior to Admission medications   Medication Sig Start Date End Date Taking? Authorizing Provider  ACCU-CHEK FASTCLIX LANCETS MISC Use to  check blood sugar up to 3 times a day DX code 250.02 insulin requiring 11/06/16  Yes Ledell Noss, MD  atorvastatin (LIPITOR) 40 MG tablet Take 40 mg by mouth daily.   Yes [provider]  glucose blood (ACCU-CHEK SMARTVIEW) test strip USE TO CHECK BLOOD SUGAR 3 to 4 times daily. diag code E11.9. Insulin dependent 12/15/16  Yes Ledell Noss, MD  insulin glargine (LANTUS) 100 UNIT/ML injection Inject 0.35 mLs (35 Units total) into the skin daily. INJECT 40 UNITS INTO THE SKIN EVERY MORNING WHEN YOU WAKE UP Patient taking differently: Inject 40 Units into the skin daily before breakfast.  09/14/17  Yes Ledell Noss, MD  Insulin Pen Needle (CAREFINE PEN NEEDLES) 31G X 8 MM MISC Please use with liraglutide. Use 0.6 mg once daily for a week. Thereafter, you will take 1.2 mg daily. 11/06/16  Yes Ledell Noss, MD  Insulin Syringes, Disposable, U-100 1 ML MISC 2 Syringes by Does not apply route daily. Use to inject insulin into the skin 1 time daily. diag code E11.9. Insulin dependent 12/15/16  Yes Ledell Noss, MD  losartan (COZAAR) 100 MG tablet Take 1 tablet (100 mg total) by mouth daily. 08/03/17  Yes Ledell Noss, MD  omeprazole (PRILOSEC) 40 MG capsule Take 1 capsule (40 mg total) by mouth daily. 01/19/17  Yes Milus Banister, MD  senna (SENOKOT) 8.6 MG tablet Take 1 tablet (8.6 mg total) by mouth as needed for constipation. Patient taking  differently: Take 2 tablets by mouth daily.  08/04/17  Yes Ledell Noss, MD  verapamil (CALAN-SR) 180 MG CR tablet Take 1 tablet (180 mg total) by mouth daily. 08/03/17 08/03/18 Yes Ledell Noss, MD  VICTOZA 18 MG/3ML SOPN INJECT 0.2ML (1.2MG  TOTAL) INTO THE SKIN DAILY Patient taking differently: INJECT 0.2ML (1.2MG  TOTAL) INTO THE SKIN DAILY BEFORE BREAKFAST 07/05/17  Yes Axel Filler, MD  Wheat Dextrin (BENEFIBER PO) Take 1 tablet by mouth daily as needed (constipation).   Yes [provider]  cyclobenzaprine (FLEXERIL) 5 MG tablet Take 1 tablet (5 mg total) by  mouth 2 (two) times daily as needed for muscle spasms. 09/08/17   McDonald, Mia A, PA-C  Triamcinolone Acetonide (TRIAMCINOLONE 0.1 % CREAM : EUCERIN) CREA Apply 1 application topically 2 (two) times daily. 11/25/17   Wieters, Elesa Hacker, PA-C    Family History Family History  Problem Relation Age of Onset  . Diabetes Mother   . Lung cancer Maternal Uncle   . Breast cancer Maternal Grandmother   . Liver cancer Maternal Grandfather   . Heart disease Brother   . Colon cancer Neg Hx     Social History Social History   Tobacco Use  . Smoking status: Never Smoker  . Smokeless tobacco: Never Used  Substance Use Topics  . Alcohol use: No    Alcohol/week: 0.0 oz  . Drug use: No     Allergies   Ace inhibitors; Amitriptyline hcl; Latex; and Prednisone   Review of Systems Review of Systems  Constitutional: Negative for fever.  HENT: Negative for ear pain.   Eyes: Negative for pain, itching and visual disturbance.  Respiratory: Negative for shortness of breath.   Cardiovascular: Negative for chest pain.  Gastrointestinal: Negative for abdominal pain, diarrhea, nausea and vomiting.  Skin: Positive for color change. Negative for rash and wound.       pruritis  Neurological: Negative for dizziness, light-headedness and headaches.     Physical Exam Triage Vital Signs ED Triage Vitals  Enc Vitals Group     BP 11/25/17 1012 (!) 143/82     Pulse Rate 11/25/17 1012 86     Resp 11/25/17 1012 18     Temp 11/25/17 1012 98.6 F (37 C)     Temp src --      SpO2 11/25/17 1012 100 %     Weight --      Height --      Head Circumference --      Peak Flow --      Pain Score 11/25/17 1013 3     Pain Loc --      Pain Edu? --      Excl. in [place]? --    No data found.  Updated Vital Signs BP (!) 143/82   Pulse 86   Temp 98.6 F (37 C)   Resp 18   LMP 02/15/2009   SpO2 100%   Physical Exam  Constitutional: She appears well-developed and well-nourished. No distress.  HENT:    Head: Normocephalic and atraumatic.  Right Ear: Tympanic membrane and ear canal normal. Tympanic membrane is not erythematous.  Left Ear: Tympanic membrane and ear canal normal. Tympanic membrane is not erythematous.  Cardiovascular: Normal rate and regular rhythm.  Pulmonary/Chest: Effort normal and breath sounds normal.  Skin: Skin is warm and dry.  Mild hyperpigmentation around orbits, no erythema or edema. No evidence of excoriation. No papules or evidence of tunneling.      UC Treatments /  Results  Labs (all labs ordered are listed, but only abnormal results are displayed) Labs Reviewed - No data to display  EKG  EKG Interpretation None       Radiology No results found.  Procedures Procedures (including critical care time)  Medications Ordered in UC Medications - No data to display   Initial Impression / Assessment and Plan / UC Course  I have reviewed the triage vital signs and the nursing notes.  Pertinent labs & imaging results that were available during my care of the patient were reviewed by me and considered in my medical decision making (see chart for details).     Allergies reviewed- states she does have Nausea nd vomiting with prednisone, has used hydrocortisone cream and pills before without issue.   Given Triamcinolone: Eucerin 1:1 cream to apply to face, advised to only use a thin amount. Follow up with primary if symptoms not improving. Patient called and pharmacy does not compound. Gave triamcinolone and advised to use a thicker cream like eucerin on face vs lotion.  Final Clinical Impressions(s) / UC Diagnoses   Final diagnoses:  Pruritus    ED Discharge Orders        Ordered    Triamcinolone Acetonide (TRIAMCINOLONE 0.1 % CREAM : EUCERIN) CREA  2 times daily     11/25/17 1033       Controlled Substance Prescriptions  Controlled Substance Registry consulted? Not Applicable   Janith Lima, [place] 11/25/17 1141

## 2017-11-25 NOTE — Discharge Instructions (Signed)
Take prescription to pharmacy.  Has a steroid cream and Eucerin for moisturizing and relief of itching.   Please follow up with primary doctor if cream not providing relief.

## 2017-11-25 NOTE — ED Triage Notes (Signed)
Pt c/o rash on her face for the last few weeks, states its very itchy. No observable rash noted on face. Pt states shes taken benadryl without relief.

## 2017-12-09 ENCOUNTER — Encounter (INDEPENDENT_AMBULATORY_CARE_PROVIDER_SITE_OTHER): Payer: Self-pay

## 2017-12-09 ENCOUNTER — Other Ambulatory Visit: Payer: Self-pay

## 2017-12-09 ENCOUNTER — Encounter: Payer: Self-pay | Admitting: Internal Medicine

## 2017-12-09 ENCOUNTER — Ambulatory Visit (INDEPENDENT_AMBULATORY_CARE_PROVIDER_SITE_OTHER): Payer: Medicare Other | Admitting: Internal Medicine

## 2017-12-09 VITALS — BP 134/84 | HR 87 | Temp 98.5°F | Ht <= 58 in | Wt 174.9 lb

## 2017-12-09 DIAGNOSIS — J069 Acute upper respiratory infection, unspecified: Secondary | ICD-10-CM | POA: Diagnosis not present

## 2017-12-09 DIAGNOSIS — I10 Essential (primary) hypertension: Secondary | ICD-10-CM

## 2017-12-09 DIAGNOSIS — H6123 Impacted cerumen, bilateral: Secondary | ICD-10-CM | POA: Diagnosis not present

## 2017-12-09 DIAGNOSIS — E11319 Type 2 diabetes mellitus with unspecified diabetic retinopathy without macular edema: Secondary | ICD-10-CM

## 2017-12-09 DIAGNOSIS — Z79899 Other long term (current) drug therapy: Secondary | ICD-10-CM | POA: Diagnosis not present

## 2017-12-09 DIAGNOSIS — Z794 Long term (current) use of insulin: Secondary | ICD-10-CM

## 2017-12-09 DIAGNOSIS — N809 Endometriosis, unspecified: Secondary | ICD-10-CM | POA: Diagnosis not present

## 2017-12-09 DIAGNOSIS — H938X1 Other specified disorders of right ear: Secondary | ICD-10-CM

## 2017-12-09 MED ORDER — LIRAGLUTIDE 18 MG/3ML ~~LOC~~ SOPN: PEN_INJECTOR | SUBCUTANEOUS | 5 refills | Status: DC

## 2017-12-09 NOTE — Assessment & Plan Note (Addendum)
Assessment Last seen in clinic on October 16 with attempted but unsuccessful irrigation. Ceruminolytics were not initiated due to inability to evaluate for perforation. Referral to ENT was placed. She called them and was not able to make an appointment with them. Since October, she states that her symptoms did improve, but they worsened this past week, only on the right.  Today's symptoms seem more consistent with eustachian tube dysfunction secondary to viral URI, however on exam she does have a moderate amount of cerumen bilaterally that make it difficult to visualize her TM.  Plan - Advised patient to call ENT to schedule an appointment with them when she is able - ENT to assist with cerumen removal - Benadryl OTC at night for right ear fullness - Advised to RTC or call clinic if symptoms not improving

## 2017-12-09 NOTE — Assessment & Plan Note (Addendum)
Assessment Chronic and stable  Plan - Refilled victoza 1.2mg  qday - Continue lantus 35u QHS

## 2017-12-09 NOTE — Progress Notes (Signed)
   CC: fullness in right ear  HPI:  [name] is a 62 y.o. with PMH significant for DM2 and endometriosis who presents with sensation of fullness in her right ear.  She was seen in clinic on October 16 with complaints of bilateral ear fullness. Physical exam at that time was significant for thick bilateral earwax, right greater than left with inability to visualize the right tympanic membrane. Irrigation of her ears was attempted in the office but was unsuccessful because her right ear canal was narrow and it became painful. She was not initiated on ceruminolytics at that time due to inability to evaluate for perforation. She was referred to ENT for irrigation.  She states that she called ENT and that they did not have an available appointment until January 7. She states that she has an appointment on that date with her ophthalmologist, and would not have been able to make it. She is not sure when else they are available next week. She states that since October, her bilateral ear fullness, but her for a brief period of time and then returned about a week ago. She also began to experience sore throat, cough productive of NB white sputum, runny nose, and congestion about a week ago. She states that she can't hear as well out of the right ear. She denies pain in her right ear. Endorses sharp chest pain that only occurs with cough and denies shortness of breath. She has been trying cough syrup and cough drops with some relief.  She did receive a flu shot and she denies recent sick contacts or travel.  Past Medical History:  Diagnosis Date  . Carpal tunnel syndrome, bilateral    confirmed on nerve conduction studies  . Diabetes mellitus type 2, uncontrolled ([place])   . Diabetic peripheral neuropathy ([place])   . GERD (gastroesophageal reflux disease)   . History of endometriosis   . History of Helicobacter infection 11/07  . Hyperlipidemia    Review of Systems:   GEN: No fevers or  chills HEENT: Positive for congestion, sore throat, scratchy voice, right ear fullness, decreased hearing in right ear CV: Mild sharp chest pain with cough. No palpitations PULM: Positive for cough productive of NB white sputum.  Physical Exam:  Vitals:   12/09/17 1334  BP: (!) 143/83  Pulse: 87  Temp: 98.5 F (36.9 C)  TempSrc: Oral  SpO2: 100%  Weight: 174 lb 14.4 oz (79.3 kg)  Height: 4\' 10"  (1.473 m)   GEN: Alert and oriented. Pleasant. HEENT: [place]/NT. No erythema of bilateral ear canals. Difficult to visualize tympanic membranes due to a moderate amount of cerumen. PERRL. No pharyngeal erythema. No sinus tenderness. No cervical LAD. CV: NR, RR. No m/r/g. No LE edema PULM: CTAB, no wheezes.  Assessment & Plan:   Right eustachian tube dysfunction secondary to viral URI Sensation of right ear fullness for the last week, concomitant with viral URI symptoms. No erythema or sign of infection in right ear.  - Recommended OTC Benadryl at night to help with sensation of ear fullness - Advised patient to call ENT to reschedule an appointment for cerumen removal. - Advised patient to return to clinic or call if symptoms do not improve in the next few days  See Encounters Tab for problem based charting.  Patient discussed with Dr. Eppie Gibson

## 2017-12-09 NOTE — Assessment & Plan Note (Addendum)
Assessment BP 143/83, recheck was 134/84. She does have a viral URI and BP is well controlled, so we will not make changes to her medications at this time. On verapamil 180mg  qday and losartan 100mg  daily. She does not check her BP at home.  Plan - Continue verapamil 180mg  qday and losartan 100mg  daily - Return on 12/21/2017 for visit with PCP

## 2017-12-09 NOTE — Patient Instructions (Addendum)
FOLLOW-UP INSTRUCTIONS When: 12/21/2017, already scheduled For: BP and diabetes management What to bring: medications   Nancy [name],  It was a pleasure to meet you today.  For your cold and feeling of your ear being stopped up, you can buy an over the counter medicine like Benadryl. This might help you feel like you can hear better. This medicine can make you feel sleepy, so I would recommend taking it before you go to bed at night.  Please call the Ear, Nose, and Throat doctors to schedule an appointment for next week. They will be able to help you remove the ear wax in your ears.

## 2017-12-13 ENCOUNTER — Telehealth: Payer: Self-pay

## 2017-12-13 NOTE — Telephone Encounter (Signed)
Called pt - stated recall on Losartan - not taking d/t recall; wants to know if it can be changed to something else? Thanks

## 2017-12-13 NOTE — Telephone Encounter (Signed)
Needs to speak with nurse about a recalled on losartan (COZAAR) 100 MG tablet. Please call pt back.

## 2017-12-14 DIAGNOSIS — E113212 Type 2 diabetes mellitus with mild nonproliferative diabetic retinopathy with macular edema, left eye: Secondary | ICD-10-CM | POA: Diagnosis not present

## 2017-12-14 LAB — HM DIABETES EYE EXAM

## 2017-12-15 ENCOUNTER — Other Ambulatory Visit: Payer: Self-pay | Admitting: Internal Medicine

## 2017-12-15 MED ORDER — ATORVASTATIN CALCIUM 40 MG PO TABS: 40.0000 mg | ORAL_TABLET | Freq: Every day | ORAL | 11 refills | Status: DC

## 2017-12-15 MED ORDER — OLMESARTAN MEDOXOMIL 40 MG PO TABS: 40.0000 mg | ORAL_TABLET | Freq: Every day | ORAL | 1 refills | Status: DC

## 2017-12-15 NOTE — Telephone Encounter (Signed)
Thank you :)

## 2017-12-15 NOTE — Telephone Encounter (Signed)
Patient want prescription atorvastatin 40mg  Nancy [name] on [employer].  Patient is running very low on her medicine  Patient also wants to talk to someone about her blood pressure recall, the pharmacy state she needs to talk to physician.

## 2017-12-15 NOTE — Telephone Encounter (Signed)
I have discontinued the losartan and sent an Rx for olmesartan 40 mg daily to her pharmacy. Called and notified Nancy [name] of this change. She request a refill of atorvastatin as well so that was sent.

## 2017-12-20 ENCOUNTER — Encounter: Payer: Self-pay | Admitting: Internal Medicine

## 2017-12-20 NOTE — Progress Notes (Signed)
Case discussed with Dr. Ronalee Red at the time of the visit.  We reviewed the resident's history and exam and pertinent patient test results.  I agree with the assessment, diagnosis, and plan of care documented in the resident's note.

## 2017-12-20 NOTE — Progress Notes (Signed)
CC: follow up   HPI:  Nancy [name] is a 62 y.o. with PMH HTN, T2DM, severe obesity, depression, CKD 2, GERD, and treated hepatitis C with hepatic fibrosis who presents for acute concern of itchy skin. Please see the assessment and plans for the status of the patient chronic medical problems.   Past Medical History:  Diagnosis Date  . Carpal tunnel syndrome, bilateral    confirmed on nerve conduction studies  . Chronic hepatitis C ([place])    considered cured 06/2016 post retroviral therapy   . Diabetes mellitus type 2, uncontrolled ([place])   . Diabetic peripheral neuropathy ([place])   . GERD (gastroesophageal reflux disease)   . History of endometriosis   . History of Helicobacter infection 11/07  . Hyperlipidemia   . Liver fibrosis 02/17/2016   F2/3    Review of Systems: Refer to history of present illness and assessment and plans for pertinent review of systems, all others reviewed and negative  Physical Exam:  Vitals:   12/21/17 1324 12/21/17 1411  BP: (!) 145/86 (!) 149/81  Pulse: 80 80  Temp: 98 F (36.7 C)   TempSrc: Oral   SpO2: 100%   Weight: 174 lb 9.6 oz (79.2 kg)   Height: 4\' 10"  (1.473 m)    General: well appearing, no acute distress  Cardiac: RRR, no murmur appreciated, no peripheral edema   Pulm: lungs clear to auscultation Skin: dry hyperpigmented skin around the eyes, moreso on the bottom than the top lid, eyes are watery, erythematous nasal turbinates, no signs of ear effusion, no sinus tenderness, congested   Assessment & Plan:   Environmental allergies  Having itching of her face and eyes, runny nose, and watery eyes. Denies new environmental exposure, no new soaps or lotions. She also has darkening around her eyelids, moreso on the bottom. Eyes felt less itchy when she was able to use Refresh eye drops but she ran out of the samples that she had of this.   - OTC eye drops for itchiness  - Start sinus washes and flonase/ nasonex  - Start loratadine    Skin darkening  Has darkening of the skin over her eyelids, moreso on the bottom. This is in the setting of uncontrolled allergies. On exam there is skin thickening, dryness and hyperpigmentation. There is also darkening of the skin folds of the neck consistent with acanthosis nigricans. She notes that this skin darkening began about a month ago, I do not remember it being there on last visit in October. Recently we stopped the metformin for GI upset, wondering if this could be from increased insulin resistance from being off of that medication.  - trial of retinoid cream, counseled on keeping this out of her eyes   T2DM with retinopathy and proteinuria  A1c 6.8 today, this is improved from prior low A1cs which were consistently 6.0 or below. Patient says she has been eating more foods that she shouldn't recently, she's been having more soda than usual. No meter today but this mornings glucose was 148 and yesterday morning was 182.  - continue lantus 35 U qHS, victoza 1.2 mg qd and CBG monitoring  - following with opthalmology for DM retinopathy and cataracts  - on olmesartan for proteinuria, allergic to Ace- I   HTN  Blood pressure is elevated at 149/81 today, this is above goal. She related this to her frustration with allergy symptoms. Blood pressure had been well controlled on visits earlier this year. - continue Verapamil 180 mg  qd, olmesartan 40 mg qd  - Potassium is 3.1 on BMP potassium has been persistently low on recent checks, will prescribe K-dur 20 meq qd for 5 days then have her come back for repeat BMP, Mag, and blood pressure check in [facility] when I am there next month. I anticipate that I may need to start an antihypertensive at that time, maybe a combination pill with HCTZ.    CKD, stage 2  11/2017 GFR 28 from 55 in December and low potassium in the setting of diarrheal illness.  - Recheck BMP - GFR improved to 48, crt 1.2 (b/l 1.0), BUN WNL, and K is low    Preventative health   - PPSV 13 at next visit, then 23 no sooner than 2 months   See Encounters Tab for problem based charting.  Patient seen with Dr. Angelia Mould

## 2017-12-21 ENCOUNTER — Encounter: Payer: Self-pay | Admitting: Internal Medicine

## 2017-12-21 ENCOUNTER — Other Ambulatory Visit: Payer: Self-pay

## 2017-12-21 ENCOUNTER — Telehealth: Payer: Self-pay | Admitting: Internal Medicine

## 2017-12-21 ENCOUNTER — Encounter: Payer: Self-pay | Admitting: *Deleted

## 2017-12-21 ENCOUNTER — Encounter (INDEPENDENT_AMBULATORY_CARE_PROVIDER_SITE_OTHER): Payer: Self-pay

## 2017-12-21 ENCOUNTER — Ambulatory Visit (INDEPENDENT_AMBULATORY_CARE_PROVIDER_SITE_OTHER): Payer: Medicare Other | Admitting: Internal Medicine

## 2017-12-21 VITALS — BP 149/81 | HR 80 | Temp 98.0°F | Ht <= 58 in | Wt 174.6 lb

## 2017-12-21 DIAGNOSIS — I10 Essential (primary) hypertension: Secondary | ICD-10-CM

## 2017-12-21 DIAGNOSIS — E1122 Type 2 diabetes mellitus with diabetic chronic kidney disease: Secondary | ICD-10-CM | POA: Diagnosis not present

## 2017-12-21 DIAGNOSIS — Z794 Long term (current) use of insulin: Secondary | ICD-10-CM

## 2017-12-21 DIAGNOSIS — R809 Proteinuria, unspecified: Secondary | ICD-10-CM | POA: Diagnosis not present

## 2017-12-21 DIAGNOSIS — N182 Chronic kidney disease, stage 2 (mild): Secondary | ICD-10-CM

## 2017-12-21 DIAGNOSIS — E11319 Type 2 diabetes mellitus with unspecified diabetic retinopathy without macular edema: Secondary | ICD-10-CM | POA: Diagnosis not present

## 2017-12-21 DIAGNOSIS — K219 Gastro-esophageal reflux disease without esophagitis: Secondary | ICD-10-CM | POA: Diagnosis not present

## 2017-12-21 DIAGNOSIS — Z79899 Other long term (current) drug therapy: Secondary | ICD-10-CM

## 2017-12-21 DIAGNOSIS — F329 Major depressive disorder, single episode, unspecified: Secondary | ICD-10-CM

## 2017-12-21 DIAGNOSIS — I152 Hypertension secondary to endocrine disorders: Secondary | ICD-10-CM

## 2017-12-21 DIAGNOSIS — Z6836 Body mass index (BMI) 36.0-36.9, adult: Secondary | ICD-10-CM

## 2017-12-21 DIAGNOSIS — K740 Hepatic fibrosis: Secondary | ICD-10-CM

## 2017-12-21 DIAGNOSIS — L814 Other melanin hyperpigmentation: Secondary | ICD-10-CM

## 2017-12-21 DIAGNOSIS — E1159 Type 2 diabetes mellitus with other circulatory complications: Secondary | ICD-10-CM

## 2017-12-21 DIAGNOSIS — L988 Other specified disorders of the skin and subcutaneous tissue: Secondary | ICD-10-CM | POA: Diagnosis not present

## 2017-12-21 DIAGNOSIS — L819 Disorder of pigmentation, unspecified: Secondary | ICD-10-CM

## 2017-12-21 DIAGNOSIS — Z9109 Other allergy status, other than to drugs and biological substances: Secondary | ICD-10-CM | POA: Diagnosis not present

## 2017-12-21 DIAGNOSIS — I129 Hypertensive chronic kidney disease with stage 1 through stage 4 chronic kidney disease, or unspecified chronic kidney disease: Secondary | ICD-10-CM

## 2017-12-21 DIAGNOSIS — Z8619 Personal history of other infectious and parasitic diseases: Secondary | ICD-10-CM

## 2017-12-21 LAB — POCT GLYCOSYLATED HEMOGLOBIN (HGB A1C): Hemoglobin A1C: 6.8

## 2017-12-21 LAB — GLUCOSE, CAPILLARY: GLUCOSE-CAPILLARY: 80 mg/dL (ref 65–99)

## 2017-12-21 MED ORDER — TRETINOIN 0.025 % EX GEL: Freq: Every day | CUTANEOUS | 0 refills | Status: DC

## 2017-12-21 MED ORDER — MOMETASONE FUROATE 50 MCG/ACT NA SUSP: 2.0000 | Freq: Every day | NASAL | 12 refills | Status: DC

## 2017-12-21 MED ORDER — LORATADINE 10 MG PO TABS: 10.0000 mg | ORAL_TABLET | Freq: Every day | ORAL | 2 refills | Status: DC | PRN

## 2017-12-21 NOTE — Patient Instructions (Addendum)
It was a pleasure to see you today Nancy [name]  -- for your Itchy eyes - try any over the counter lubricating eye drop   For your itchy skin - you can stop using the steroid cream, I am prescribing you a new cream to try.  - For your runny nose - start trying nasal washes, I have provided the instructions for this below, after you have cleansed your nose use a nose spray like flonase or nasonex, either is fine. Also pick up an over the counter allergy medication like claritin, zyrtec or allegra - you can use whichever one is most affordable to you.  - your blood pressure was high today - try to eat less salt in your food and keep working on walking and exercising  - Please call our clinic if you have any problems or questions, we may be able to help you and keep you from a long emergency room wait. Our clinic and after hours phone number is [phone]    FOLLOW-UP INSTRUCTIONS When: 3 months  For: diabetes, blood pressure  What to bring: glucose meter, medication bottles     BUFFERED ISOTONIC SALINE NASAL IRRIGATION  The Benefits:  1. When you irrigate, the isotonic saline (salt water) acts as a solvent and washes the mucus crusts and other debris from your nose.  2. This decongests and improves the airflow into your nose. The sinus passages begin to open.   Over the counter:  I recommend a Squirt Bottle form of saline to provide an adequate volume of irrigation to clear your sinuses.   Home Recipe:  1. Choose a 1-quart glass jar that is thoroughly cleansed.  2. Fill with sterile or distilled water, or you can boil water from the tap.  3. Add 1 to 2 heaping teaspoons of "pickling/canning/sea" salt (NOT table salt as it contains a large number of additives). This salt is available at the grocery store in the food canning section.  4. Mix ingredients together and store at room temperature. Discard after one week. If you find this solution too strong, you may decrease the  amount of salt added to 1 to 1  teaspoons. With children it is often best to start with a milder solution and advance slowly. Irrigate with 240 ml (8 oz) twice daily.  The Instructions:  You should plan to irrigate your nose with buffered isotonic saline 2 times per day. Many people prefer to warm the solution slightly in the microwave - but be sure that the solution is NOT HOT. Stand over the sink (some do this in the shower) and squirt the solution into each side of your nose, keeping your mouth open. This allows you to spit the saltwater out of your mouth. It will not harm you if you swallow a little.  If you have been told to use a nasal steroid such as Flonase, Nasonex, or Nasacort, you should always use isortonic saline solution first, then use your nasal steroid product. The nasal steroid is much more effective when sprayed onto clean nasal membranes and the steroid medicine will reach deeper into the nose.  Most people experience a little burning sensation the first few times they use a isotonic saline solution, but this usually goes away within a few days.

## 2017-12-21 NOTE — Telephone Encounter (Signed)
DR Hetty Ely GAVE HER SCRIP FOR NEW CREAM, NOY COVERED BY HER INSURANCE WITHOUT AUTHORIZATION, PLEASE ADVISE PATIENT.

## 2017-12-22 ENCOUNTER — Telehealth: Payer: Self-pay | Admitting: Internal Medicine

## 2017-12-22 ENCOUNTER — Telehealth: Payer: Self-pay | Admitting: *Deleted

## 2017-12-22 LAB — BMP8+ANION GAP
Anion Gap: 16 mmol/L (ref 10.0–18.0)
BUN/Creatinine Ratio: 9 — ABNORMAL LOW (ref 12–28)
BUN: 11 mg/dL (ref 8–27)
CALCIUM: 8.7 mg/dL (ref 8.7–10.3)
CHLORIDE: 104 mmol/L (ref 96–106)
CO2: 25 mmol/L (ref 20–29)
CREATININE: 1.22 mg/dL — AB (ref 0.57–1.00)
GFR calc Af Amer: 56 mL/min/{1.73_m2} — ABNORMAL LOW (ref >59)
GFR calc non Af Amer: 48 mL/min/{1.73_m2} — ABNORMAL LOW (ref >59)
GLUCOSE: 90 mg/dL (ref 65–99)
Potassium: 3.1 mmol/L — ABNORMAL LOW (ref 3.5–5.2)
Sodium: 145 mmol/L — ABNORMAL HIGH (ref 134–144)

## 2017-12-22 NOTE — Telephone Encounter (Addendum)
[place] infprmation was sent to CoverMyMeds for Tretinoin.  Information being reviewed with response expected with 72 hours.  Sander Nephew, RN 12/22/2017 4:39 PM.  Call to [employer] denied as its [place] is not supported.  Denial letter to  Be sent.  Response to be forwarded to Dr. Hetty Ely.  Sander Nephew, RN 12/24/2017 3:00 PM.

## 2017-12-22 NOTE — Telephone Encounter (Signed)
Patient was scribe some cream, insurance isn't covering. Patient was wondering was there some other cream that she can get, that her insurance can cover. The pharmacy also gave her this number [phone], she is sure

## 2017-12-24 ENCOUNTER — Telehealth: Payer: Self-pay | Admitting: *Deleted

## 2017-12-24 ENCOUNTER — Encounter: Payer: Self-pay | Admitting: Internal Medicine

## 2017-12-24 DIAGNOSIS — L814 Other melanin hyperpigmentation: Secondary | ICD-10-CM | POA: Insufficient documentation

## 2017-12-24 DIAGNOSIS — Z9109 Other allergy status, other than to drugs and biological substances: Secondary | ICD-10-CM | POA: Insufficient documentation

## 2017-12-24 MED ORDER — POTASSIUM CHLORIDE ER 20 MEQ PO TBCR: 20.0000 meq | EXTENDED_RELEASE_TABLET | Freq: Every day | ORAL | 0 refills | Status: DC

## 2017-12-24 MED ORDER — POTASSIUM CHLORIDE ER 20 MEQ PO TBCR: 10.0000 meq | EXTENDED_RELEASE_TABLET | Freq: Every day | ORAL | 0 refills | Status: DC

## 2017-12-24 NOTE — Assessment & Plan Note (Signed)
11/2017 GFR 28 from 55 in December and low potassium in the setting of diarrheal illness.  - Recheck BMP - GFR improved to 48, crt 1.2 (b/l 1.0), BUN WNL, and K is low   - continue olmesartan for proteinuria, allergic to ACE-I

## 2017-12-24 NOTE — Assessment & Plan Note (Signed)
Having itching of her face and eyes, runny nose, and watery eyes. Denies new environmental exposure, no new soaps or lotions. She also has darkening around her eyelids, moreso on the bottom. Eyes felt less itchy when she was able to use Refresh eye drops but she ran out of the samples that she had of this.   - OTC eye drops for itchiness  - Start sinus washes and flonase/ nasonex  - Start loratadine

## 2017-12-24 NOTE — Assessment & Plan Note (Signed)
Has darkening of the skin over her eyelids, moreso on the bottom. This is in the setting of uncontrolled allergies. On exam there is skin thickening, dryness and hyperpigmentation. There is also darkening of the skin folds of the neck consistent with acanthosis nigricans. She notes that this skin darkening began about a month ago, I do not remember it being there on last visit in October. Recently we stopped the metformin for GI upset, wondering if this could be from increased insulin resistance from being off of that medication.  - trial of retinoid cream, counseled on keeping this out of her eyes

## 2017-12-24 NOTE — Addendum Note (Signed)
Addended by: Meryl Dare on: 12/24/2017 08:52 PM   Modules accepted: Orders

## 2017-12-24 NOTE — Telephone Encounter (Signed)
Thank you for letting me know. The cream that I ordered was the generic but she can try any over the counter retinoid cream or an emmoliant if the retinoid isn't affordable.

## 2017-12-24 NOTE — Assessment & Plan Note (Signed)
A1c 6.8 today, this is improved from prior low A1cs which were consistently 6.0 or below. Patient says she has been eating more foods that she shouldn't recently, she's been having more soda than usual. No meter today but this mornings glucose was 148 and yesterday morning was 182.  - continue lantus 35 U qHS, victoza 1.2 mg qd and CBG monitoring  - following with opthalmology for DM retinopathy and cataracts  - on olmesartan for proteinuria, allergic to Ace- I

## 2017-12-24 NOTE — Telephone Encounter (Signed)
Dr. Hetty Ely,  [facility]) clarification: KCL 20 MEQ tab- take 10 meq daily. Wants to know w/c dose 10 or 20? Pharmacy requesting to resend, thank you!

## 2017-12-24 NOTE — Assessment & Plan Note (Signed)
Blood pressure is elevated at 149/81 today, this is above goal. She related this to her frustration with allergy symptoms. Blood pressure had been well controlled on visits earlier this year. - continue Verapamil 180 mg qd, olmesartan 40 mg qd  - Potassium is 3.1 on BMP potassium has been persistently low on recent checks, will prescribe K-dur 20 meq qd for 5 days then have her come back for repeat BMP, Mag, and blood pressure check in [facility] when I am there next month. I anticipate that I may need to start an antihypertensive at that time, maybe a combination pill with HCTZ.

## 2017-12-28 ENCOUNTER — Other Ambulatory Visit: Payer: Self-pay | Admitting: Internal Medicine

## 2017-12-28 NOTE — Progress Notes (Signed)
Internal Medicine Clinic Attending  I saw and evaluated the patient.  I personally confirmed the key portions of the history and exam documented by Dr. Blum and I reviewed pertinent patient test results.  The assessment, diagnosis, and plan were formulated together and I agree with the documentation in the resident's note. 

## 2018-01-07 ENCOUNTER — Other Ambulatory Visit: Payer: Self-pay

## 2018-01-07 ENCOUNTER — Ambulatory Visit (INDEPENDENT_AMBULATORY_CARE_PROVIDER_SITE_OTHER): Payer: Medicare Other | Admitting: Internal Medicine

## 2018-01-07 VITALS — BP 182/92 | HR 74 | Temp 98.1°F | Ht <= 58 in | Wt 176.0 lb

## 2018-01-07 DIAGNOSIS — E1159 Type 2 diabetes mellitus with other circulatory complications: Secondary | ICD-10-CM

## 2018-01-07 DIAGNOSIS — I152 Hypertension secondary to endocrine disorders: Secondary | ICD-10-CM

## 2018-01-07 DIAGNOSIS — E876 Hypokalemia: Secondary | ICD-10-CM | POA: Diagnosis not present

## 2018-01-07 DIAGNOSIS — I10 Essential (primary) hypertension: Secondary | ICD-10-CM | POA: Diagnosis not present

## 2018-01-07 DIAGNOSIS — Z8619 Personal history of other infectious and parasitic diseases: Secondary | ICD-10-CM | POA: Diagnosis not present

## 2018-01-07 DIAGNOSIS — Z79899 Other long term (current) drug therapy: Secondary | ICD-10-CM | POA: Diagnosis not present

## 2018-01-07 DIAGNOSIS — E1122 Type 2 diabetes mellitus with diabetic chronic kidney disease: Secondary | ICD-10-CM | POA: Diagnosis not present

## 2018-01-07 DIAGNOSIS — N182 Chronic kidney disease, stage 2 (mild): Secondary | ICD-10-CM

## 2018-01-07 DIAGNOSIS — F329 Major depressive disorder, single episode, unspecified: Secondary | ICD-10-CM

## 2018-01-07 DIAGNOSIS — K219 Gastro-esophageal reflux disease without esophagitis: Secondary | ICD-10-CM | POA: Diagnosis not present

## 2018-01-07 DIAGNOSIS — I129 Hypertensive chronic kidney disease with stage 1 through stage 4 chronic kidney disease, or unspecified chronic kidney disease: Secondary | ICD-10-CM

## 2018-01-07 MED ORDER — AMLODIPINE BESYLATE 10 MG PO TABS: 10.0000 mg | ORAL_TABLET | Freq: Every day | ORAL | 3 refills | Status: DC

## 2018-01-07 MED ORDER — SPIRONOLACTONE 50 MG PO TABS: 25.0000 mg | ORAL_TABLET | Freq: Every day | ORAL | 0 refills | Status: DC

## 2018-01-07 NOTE — Progress Notes (Addendum)
CC: Follow up for blood pressure and hypokalemia   HPI:  Nancy [name] is a 62 y.o. with PMH HTN, T2DM, severe obesity, depression, CKD 2, GERD, and treated hepatitis C who presents for follow up of blood pressure and hypokalemia. Please see the assessment and plans for the status of the patient chronic medical problems.   Past Medical History:  Diagnosis Date  . Carpal tunnel syndrome, bilateral    confirmed on nerve conduction studies  . Chronic hepatitis C ([place])    considered cured 06/2016 post retroviral therapy   . Diabetes mellitus type 2, uncontrolled ([place])   . Diabetic peripheral neuropathy ([place])   . GERD (gastroesophageal reflux disease)   . History of endometriosis   . History of Helicobacter infection 11/07  . Hyperlipidemia   . Liver fibrosis 02/17/2016   F2/3    Review of Systems:  Refer to history of present illness and assessment and plans for pertinent review of systems, all others reviewed and negative  Physical Exam:  Vitals:   01/07/18 1031  BP: (!) 166/93  Pulse: 72  Temp: 98.1 F (36.7 C)  TempSrc: Oral  SpO2: 100%  Weight: 176 lb (79.8 kg)  Height: 4\' 10"  (1.473 m)   Cardiac:  Trace pitting edema   Assessment & Plan:   Hypertension  Blood pressure not controlled today, well above goal (166/93). She denies symptoms of headache or blurred vision. She says she hasnt been taking the olmesartan 40 mg daily, instead she shows me a med list with losartan 100 mg daily on it. I had received a call in early January that losartan was on recall and I was asked to call in something different and switched this to olmesartan so this was concerning to me.   Our clinic nurse was able to reach her pharmacy and they reviewed her recent refills, they were as follows:  Losartan filled in May, July, and November of last year then olmesartan filled in [place] Verapamil filled in march and June of last year  All of these were 30 day supplies   She has a history  of persistent hypokalemia. Will try adding spironolactone and changing verapamil to amlodipine. She says she loves salt, adds it to all of her food, we discussed how cutting back on this may help to decrease her blood pressure as well.  - discontinue verapamil 180 mg qd  - start amlodipine 10 mg daily and spironolactone 50 mg daily  - continue olmesartan 100 mg daily  - encouraged low salt intake and exercise  - follow up in 2 weeks with return precautions for stroke like symptoms, headache, and blurry vision discussed   Hypokalemia  At last office visit potassium was 3.1, a prescription for potassium 20 mEq for 5 days was called into her pharmacy.  She returns today for recheck of her BMP and magnesium.  She says that she took all of the potassium tablets. She denies muscle cramping aches or weakness today.  - Follow up BMP and Mag today   ADDENDUM: K 3.0, Mag 1.8 - will not supplement this potassium given that I have just started spironolactone in combination with her olmesartan. We discussed the option of starting magnesium supplementation ( Mag chloride at low dose 64 mg daily) but discussed the side effect being worsening of her chronic loose bowel movements, I am also concerned about prescribing this given her renal impairment. Continue plan for spironolactone and olmesartan and follow up in 2 weeks for repeat testing  of her potassium and BP.   See Encounters Tab for problem based charting.  Patient discussed with Dr. Rebeca Alert

## 2018-01-07 NOTE — Assessment & Plan Note (Addendum)
At last office visit potassium was 3.1, a prescription for potassium 20 mEq for 5 days was called into her pharmacy.  She returns today for recheck of her BMP and magnesium.  She says that she took all of the potassium tablets. She denies muscle cramping aches or weakness today.  - Follow up BMP and Mag today   ADDENDUM: K 3.0, Mag 1.8 - will not supplement this potassium given that I have just started spironolactone in combination with her olmesartan. We discussed the option of starting magnesium supplementation ( Mag chloride at low dose 64 mg daily) but discussed the side effect being worsening of her chronic loose bowel movements, I am also concerned about prescribing this given her renal impairment. Continue plan for spironolactone and olmesartan and follow up in 2 weeks for repeat testing of her potassium and BP.

## 2018-01-07 NOTE — Assessment & Plan Note (Addendum)
Blood pressure not controlled today, well above goal (166/93). She denies symptoms of headache or blurred vision. She says she hasnt been taking the olmesartan 40 mg daily, instead she shows me a med list with losartan 100 mg daily on it. I had received a call in early January that losartan was on recall and I was asked to call in something different and switched this to olmesartan so this was concerning to me.   Our clinic nurse was able to reach her pharmacy and they reviewed her recent refills, they were as follows:  Losartan filled in May, July, and November of last year then olmesartan filled in [place] Verapamil filled in march and June of last year  All of these were 30 day supplies   She has a history of persistent hypokalemia. Will try adding spironolactone and changing verapamil to amlodipine. She says she loves salt, adds it to all of her food, we discussed how cutting back on this may help to decrease her blood pressure as well.  - discontinue verapamil 180 mg qd  - start amlodipine 10 mg daily and spironolactone 50 mg daily  - continue olmesartan 100 mg daily  - encouraged low salt intake and exercise  - follow up in 2 weeks with return precautions for stroke like symptoms, headache, and blurry vision discussed

## 2018-01-07 NOTE — Patient Instructions (Addendum)
Nancy [name]   Keep taking the losartan 100 mg daily and verapamil 120 mg daily   I will call later today you with the results of your potassium and let you know which blood pressure medication we should add on   TRY to eat a low salt diet - eat fresh foods prepared at home and dont add salt. TRY to keep active and exercising with your mom - you can powerwalk in place during commercials at home during the winter.   FOLLOW-UP INSTRUCTIONS When: 2 weeks  For: blood pressure check  What to bring: MEDICATION BOTTLES

## 2018-01-08 LAB — BMP8+ANION GAP
Anion Gap: 16 mmol/L (ref 10.0–18.0)
BUN/Creatinine Ratio: 9 — ABNORMAL LOW (ref 12–28)
BUN: 9 mg/dL (ref 8–27)
CO2: 23 mmol/L (ref 20–29)
Calcium: 8.7 mg/dL (ref 8.7–10.3)
Chloride: 103 mmol/L (ref 96–106)
Creatinine, Ser: 1.05 mg/dL — ABNORMAL HIGH (ref 0.57–1.00)
GFR calc Af Amer: 67 mL/min/{1.73_m2}
GFR calc non Af Amer: 58 mL/min/{1.73_m2} — ABNORMAL LOW
Glucose: 148 mg/dL — ABNORMAL HIGH (ref 65–99)
Potassium: 3 mmol/L — ABNORMAL LOW (ref 3.5–5.2)
Sodium: 142 mmol/L (ref 134–144)

## 2018-01-08 LAB — MAGNESIUM: Magnesium: 1.8 mg/dL (ref 1.6–2.3)

## 2018-01-10 NOTE — Progress Notes (Signed)
Internal Medicine Clinic Attending  Case discussed with Dr. Blum  at the time of the visit.  We reviewed the resident's history and exam and pertinent patient test results.  I agree with the assessment, diagnosis, and plan of care documented in the resident's note.  Alexander N Raines, MD  

## 2018-01-11 ENCOUNTER — Ambulatory Visit: Payer: Medicare Other

## 2018-01-18 ENCOUNTER — Other Ambulatory Visit: Payer: Self-pay | Admitting: *Deleted

## 2018-01-18 DIAGNOSIS — Z794 Long term (current) use of insulin: Principal | ICD-10-CM

## 2018-01-18 DIAGNOSIS — E11319 Type 2 diabetes mellitus with unspecified diabetic retinopathy without macular edema: Secondary | ICD-10-CM

## 2018-01-21 ENCOUNTER — Ambulatory Visit (INDEPENDENT_AMBULATORY_CARE_PROVIDER_SITE_OTHER): Payer: Medicare Other | Admitting: Internal Medicine

## 2018-01-21 VITALS — BP 134/73 | HR 72 | Temp 97.9°F | Ht <= 58 in | Wt 175.3 lb

## 2018-01-21 DIAGNOSIS — Z79899 Other long term (current) drug therapy: Secondary | ICD-10-CM | POA: Diagnosis not present

## 2018-01-21 DIAGNOSIS — E876 Hypokalemia: Secondary | ICD-10-CM

## 2018-01-21 DIAGNOSIS — E1159 Type 2 diabetes mellitus with other circulatory complications: Secondary | ICD-10-CM

## 2018-01-21 DIAGNOSIS — I10 Essential (primary) hypertension: Secondary | ICD-10-CM | POA: Diagnosis not present

## 2018-01-21 NOTE — Progress Notes (Addendum)
   CC: follow up of hypertension   HPI:  [name] is a 62 y.o.   Past Medical History:  Diagnosis Date  . Carpal tunnel syndrome, bilateral    confirmed on nerve conduction studies  . Chronic hepatitis C ([place])    considered cured 06/2016 post retroviral therapy   . Diabetes mellitus type 2, uncontrolled ([place])   . Diabetic peripheral neuropathy ([place])   . GERD (gastroesophageal reflux disease)   . History of endometriosis   . History of Helicobacter infection 11/07  . Hyperlipidemia   . Liver fibrosis 02/17/2016   F2/3    Review of Systems:  Refer to history of present illness and assessment and plans for pertinent review of systems, all others reviewed and negative  Physical Exam:  Vitals:   01/21/18 1105 01/21/18 1125  BP: (!) 153/81 134/73  Pulse: 78 72  Temp: 97.9 F (36.6 C)   TempSrc: Oral   SpO2: 100%   Weight: 175 lb 4.8 oz (79.5 kg)   Height: 4\' 10"  (1.473 m)    General: Well-appearing, no acute distress Skin: No rashes visible over the face, exposed neck or hands Psych: Conversational, pleasant  Assessment & Plan:   Hypertension  Last office visit February 1 blood pressure was 182/92 on verapamil and olmesartan. Verapamil was switched to amlodipine she was told to continue olmesartan and add Spironolactone blood pressure is under better control today.  With the changes in her blood pressure pressure medications were decided based on her history of hypokalemia. Today her blood pressure is under much better control 134/73.  She has been compliant with the new medications and denies any noted side effects. -Continue amlodipine 10 mg daily, Spironolactone 50 mg daily, olmesartan 100 mg daily -Encouraged low-sodium diet and exercise  Hypokalemia  Found to have a potassium of 3.0 at last office visit.  Antihypertensive medications were changed to Spironolactone for potassium sparing diuresis. She was also found to have a magnesium of 1.8, the option to try  magnesium citrate was discussed but it was decided that with her history of intermittent loose stools might be better to avoid.  -BMP today  See Encounters Tab for problem based charting.  Patient discussed with Dr. Dareen Piano

## 2018-01-21 NOTE — Assessment & Plan Note (Signed)
Last office visit February 1 blood pressure was 182/92 on verapamil and olmesartan. Verapamil was switched to amlodipine she was told to continue olmesartan and add Spironolactone blood pressure is under better control today.  With the changes in her blood pressure pressure medications were decided based on her history of hypokalemia. Today her blood pressure is under much better control 134/73.  She has been compliant with the new medications and denies any noted side effects. -Continue amlodipine 10 mg daily, Spironolactone 50 mg daily, olmesartan 100 mg daily -Encouraged low-sodium diet and exercise

## 2018-01-21 NOTE — Progress Notes (Signed)
Internal Medicine Clinic Attending  Case discussed with Dr. Blum at the time of the visit.  We reviewed the resident's history and exam and pertinent patient test results.  I agree with the assessment, diagnosis, and plan of care documented in the resident's note. 

## 2018-01-21 NOTE — Patient Instructions (Addendum)
It was a pleasure to see you today Nancy [name]   Please continue taking the Spironolactone, amlodipine, and losartan  Someone from the office will call you with the results of the blood test  FOLLOW-UP INSTRUCTIONS When: Ill see you April 16th  For: check up of your diabetes What to bring:  Medication bottles and meter

## 2018-01-21 NOTE — Assessment & Plan Note (Addendum)
Found to have a potassium of 3.0 at last office visit.  Antihypertensive medications were changed to Spironolactone for potassium sparing diuresis. She was also found to have a magnesium of 1.8, the option to try magnesium citrate was discussed but it was decided that with her history of intermittent loose stools might be better to avoid.  -BMP today

## 2018-01-22 LAB — BMP8+ANION GAP
Anion Gap: 15 mmol/L (ref 10.0–18.0)
BUN/Creatinine Ratio: 10 — ABNORMAL LOW (ref 12–28)
BUN: 11 mg/dL (ref 8–27)
CHLORIDE: 105 mmol/L (ref 96–106)
CO2: 24 mmol/L (ref 20–29)
CREATININE: 1.07 mg/dL — AB (ref 0.57–1.00)
Calcium: 8.7 mg/dL (ref 8.7–10.3)
GFR calc Af Amer: 65 mL/min/{1.73_m2} (ref >59)
GFR calc non Af Amer: 57 mL/min/{1.73_m2} — ABNORMAL LOW (ref >59)
GLUCOSE: 117 mg/dL — AB (ref 65–99)
POTASSIUM: 3.1 mmol/L — AB (ref 3.5–5.2)
SODIUM: 144 mmol/L (ref 134–144)

## 2018-01-22 MED ORDER — LIRAGLUTIDE 18 MG/3ML ~~LOC~~ SOPN: PEN_INJECTOR | SUBCUTANEOUS | 6 refills | Status: DC

## 2018-01-25 ENCOUNTER — Telehealth: Payer: Self-pay | Admitting: Internal Medicine

## 2018-01-25 NOTE — Telephone Encounter (Signed)
Patient is calling to get lab results

## 2018-01-26 MED ORDER — MAGNESIUM CHLORIDE 64 MG PO TBEC: 1.0000 | DELAYED_RELEASE_TABLET | Freq: Every day | ORAL | 0 refills | Status: AC

## 2018-01-26 NOTE — Telephone Encounter (Signed)
I agree

## 2018-01-26 NOTE — Telephone Encounter (Signed)
Called to communicate the results of potassium still being low on last recheck with Ms. Jenson. She also had low magnesium on last check. We were hesitant to try a magnesium supplementation given her history of loose stools but she says that her stools have been more regular recently and she is open to trying the magnesium.  I will send in a prescription for sustained release magnesium chloride 64 mg x4 tablets to her pharmacy, I am hopeful that the sustained release low dose formulation may minimize the loose bowel movement side effect. She will continue spironolactone and olmesartan, no further potassium repletion now that she is on this combination. We will recheck the BMP at her apt in April.

## 2018-02-03 ENCOUNTER — Other Ambulatory Visit: Payer: Self-pay | Admitting: Gastroenterology

## 2018-02-03 ENCOUNTER — Other Ambulatory Visit: Payer: Self-pay | Admitting: Internal Medicine

## 2018-02-07 ENCOUNTER — Telehealth: Payer: Self-pay

## 2018-02-07 MED ORDER — OLMESARTAN MEDOXOMIL 40 MG PO TABS: 40.0000 mg | ORAL_TABLET | Freq: Every day | ORAL | 3 refills | Status: DC

## 2018-02-07 MED ORDER — SPIRONOLACTONE 50 MG PO TABS: 25.0000 mg | ORAL_TABLET | Freq: Every day | ORAL | 3 refills | Status: DC

## 2018-02-07 NOTE — Telephone Encounter (Signed)
Questions about meds. Please call pt back.

## 2018-02-07 NOTE — Telephone Encounter (Signed)
I called [facility] - stated Olmesartan is on backorder; they had called other pharmacies, not available. Please send in another med. Thanks  I called and informed pt of med being on backorder. She will continue taking what she has until new med is ordered.

## 2018-02-07 NOTE — Telephone Encounter (Signed)
I was able to reach [name], we discussed the concern of switching antihypertensives now that her blood pressure is well controlled and she agreed and is willing to try to pick up the medication at the [facility]. I attempted to get in touch with the pharmacy but the phone system disconnects when I try to reach a pharmacist so I will attempt to send the olmesartan in. I will also send in the requested spironolactone to the [facility] pharmacy so that she doesn't have to go to multiple pharmacies to pick up her refills.  [name] has other concerns. She has noticed swelling in her left leg which relieves with elevation, sometimes her right leg is affected as well. I explained that swelling in the ankles can be a side effect of the amlodipine. She is also having numbness and tingling in her left hand- this comes and goes, she says that it is similar to her symptoms of carpal tunnel in the past. Finally she has has two episodes of heaviness in her chest which are made worse with frustrating circumstances. The heaviness is in the center of her chest and radiates to the left and right side of her chest. The chest pain is non exertional and not associated with palpitations, nausea, or diaphoresis. She doesn't want to go to the emergency department at this time but I asked that she contact our front desk now to schedule an appointment to be seen as soon as possible and that she will go to the emergency department if the chest pain reoccurs between now and the clinic visit.

## 2018-02-07 NOTE — Telephone Encounter (Addendum)
Called pt - very upset; confused about her medications. Stated she feels "sicker"; her "legs are swelling more". Stated the pharmacy told her one of medications need to change to something - she does not remember which one. Informed Olmesartan was refilled 3/1. And will need refill on Spironolactone.

## 2018-02-08 NOTE — Telephone Encounter (Signed)
I agree

## 2018-02-09 ENCOUNTER — Other Ambulatory Visit: Payer: Self-pay | Admitting: Internal Medicine

## 2018-02-09 MED FILL — OLMESARTAN MEDOXOMIL 40 MG: 40 | 90 days supply | Qty: 90 | Fill #0

## 2018-02-11 ENCOUNTER — Telehealth: Payer: Self-pay | Admitting: Internal Medicine

## 2018-02-11 NOTE — Telephone Encounter (Signed)
Patient said that the prescription she got, the pharmacy said that they already got a medicine similar. Pls contact patient

## 2018-02-11 NOTE — Telephone Encounter (Signed)
Called and spoke to pt, she was somewhat confused about her meds but we went over each and she is reassured

## 2018-02-14 ENCOUNTER — Other Ambulatory Visit: Payer: Self-pay | Admitting: Internal Medicine

## 2018-02-14 DIAGNOSIS — Z1231 Encounter for screening mammogram for malignant neoplasm of breast: Secondary | ICD-10-CM

## 2018-02-21 MED FILL — SPIRONOLACTONE 50 MG TABS: 50 | 30 days supply | Qty: 15 | Fill #0

## 2018-02-24 DIAGNOSIS — H61303 Acquired stenosis of external ear canal, unspecified, bilateral: Secondary | ICD-10-CM | POA: Diagnosis not present

## 2018-02-24 DIAGNOSIS — H6123 Impacted cerumen, bilateral: Secondary | ICD-10-CM | POA: Diagnosis not present

## 2018-02-24 DIAGNOSIS — L299 Pruritus, unspecified: Secondary | ICD-10-CM | POA: Diagnosis not present

## 2018-03-10 DIAGNOSIS — E113213 Type 2 diabetes mellitus with mild nonproliferative diabetic retinopathy with macular edema, bilateral: Secondary | ICD-10-CM | POA: Diagnosis not present

## 2018-03-10 LAB — HM DIABETES EYE EXAM

## 2018-03-14 ENCOUNTER — Other Ambulatory Visit: Payer: Self-pay | Admitting: Internal Medicine

## 2018-03-15 ENCOUNTER — Other Ambulatory Visit: Payer: Self-pay | Admitting: Internal Medicine

## 2018-03-15 DIAGNOSIS — Z794 Long term (current) use of insulin: Principal | ICD-10-CM

## 2018-03-15 DIAGNOSIS — E11319 Type 2 diabetes mellitus with unspecified diabetic retinopathy without macular edema: Secondary | ICD-10-CM

## 2018-03-15 MED ORDER — AMLODIPINE BESYLATE 10 MG PO TABS: 10.0000 mg | ORAL_TABLET | Freq: Every day | ORAL | 3 refills | Status: DC

## 2018-03-15 MED ORDER — OLMESARTAN MEDOXOMIL 40 MG PO TABS: 40.0000 mg | ORAL_TABLET | Freq: Every day | ORAL | 3 refills | Status: DC

## 2018-03-15 MED ORDER — ATORVASTATIN CALCIUM 40 MG PO TABS: 40.0000 mg | ORAL_TABLET | Freq: Every day | ORAL | 3 refills | Status: DC

## 2018-03-15 MED ORDER — SPIRONOLACTONE 50 MG PO TABS: 25.0000 mg | ORAL_TABLET | Freq: Every day | ORAL | 3 refills | Status: DC

## 2018-03-15 MED ORDER — LIRAGLUTIDE 18 MG/3ML ~~LOC~~ SOPN: PEN_INJECTOR | SUBCUTANEOUS | 12 refills | Status: DC

## 2018-03-15 NOTE — Telephone Encounter (Signed)
Patient is also checking to see if prescription was sent to the pharmacy

## 2018-03-22 ENCOUNTER — Other Ambulatory Visit: Payer: Self-pay

## 2018-03-22 ENCOUNTER — Encounter: Payer: Self-pay | Admitting: Internal Medicine

## 2018-03-22 ENCOUNTER — Ambulatory Visit (INDEPENDENT_AMBULATORY_CARE_PROVIDER_SITE_OTHER): Payer: Medicare Other | Admitting: Internal Medicine

## 2018-03-22 VITALS — BP 132/70 | HR 73 | Temp 98.1°F | Ht <= 58 in | Wt 174.8 lb

## 2018-03-22 DIAGNOSIS — Z6836 Body mass index (BMI) 36.0-36.9, adult: Secondary | ICD-10-CM

## 2018-03-22 DIAGNOSIS — I129 Hypertensive chronic kidney disease with stage 1 through stage 4 chronic kidney disease, or unspecified chronic kidney disease: Secondary | ICD-10-CM | POA: Diagnosis not present

## 2018-03-22 DIAGNOSIS — E1122 Type 2 diabetes mellitus with diabetic chronic kidney disease: Secondary | ICD-10-CM

## 2018-03-22 DIAGNOSIS — E113559 Type 2 diabetes mellitus with stable proliferative diabetic retinopathy, unspecified eye: Secondary | ICD-10-CM

## 2018-03-22 DIAGNOSIS — E11311 Type 2 diabetes mellitus with unspecified diabetic retinopathy with macular edema: Secondary | ICD-10-CM

## 2018-03-22 DIAGNOSIS — K219 Gastro-esophageal reflux disease without esophagitis: Secondary | ICD-10-CM

## 2018-03-22 DIAGNOSIS — Z79899 Other long term (current) drug therapy: Secondary | ICD-10-CM

## 2018-03-22 DIAGNOSIS — I10 Essential (primary) hypertension: Secondary | ICD-10-CM

## 2018-03-22 DIAGNOSIS — N182 Chronic kidney disease, stage 2 (mild): Secondary | ICD-10-CM

## 2018-03-22 DIAGNOSIS — Z794 Long term (current) use of insulin: Secondary | ICD-10-CM

## 2018-03-22 DIAGNOSIS — E876 Hypokalemia: Secondary | ICD-10-CM

## 2018-03-22 DIAGNOSIS — Z8619 Personal history of other infectious and parasitic diseases: Secondary | ICD-10-CM

## 2018-03-22 DIAGNOSIS — Z23 Encounter for immunization: Secondary | ICD-10-CM | POA: Diagnosis not present

## 2018-03-22 DIAGNOSIS — E1159 Type 2 diabetes mellitus with other circulatory complications: Secondary | ICD-10-CM

## 2018-03-22 LAB — POCT GLYCOSYLATED HEMOGLOBIN (HGB A1C): Hemoglobin A1C: 6.8

## 2018-03-22 LAB — GLUCOSE, CAPILLARY: Glucose-Capillary: 160 mg/dL — ABNORMAL HIGH (ref 65–99)

## 2018-03-22 NOTE — Progress Notes (Signed)
   CC: follow up of diabetes   HPI:  Nancy [name] is a 62 y.o. with PMH HTN, T2DM, severe obesity, depression, CKD 2, GERD, andtreatedhepatitis C who presents for follow up of diabetes and hypertension. Please see the assessment and plans for the status of the patient chronic medical problems.   Past Medical History:  Diagnosis Date  . Carpal tunnel syndrome, bilateral    confirmed on nerve conduction studies  . Chronic hepatitis C ([place])    considered cured 06/2016 post retroviral therapy   . Diabetes mellitus type 2, uncontrolled ([place])   . Diabetic peripheral neuropathy ([place])   . GERD (gastroesophageal reflux disease)   . History of endometriosis   . History of Helicobacter infection 11/07  . Hyperlipidemia   . Liver fibrosis 02/17/2016   F2/3    Review of Systems:  Refer to history of present illness and assessment and plans for pertinent review of systems, all others reviewed and negative.   Physical Exam:  Vitals:   03/22/18 1342  BP: 132/70  Pulse: 73  Temp: 98.1 F (36.7 C)  TempSrc: Oral  SpO2: 100%  Weight: 174 lb 12.8 oz (79.3 kg)  Height: 4\' 10"  (1.473 m)   General: well appearing, no acute distress  HEENT: PERRLA, no cataracts appreciated  Cardiac: regular rate and rhythm, normal S1 and S2, no murmurs, rubs, or gallops, no peripheral edema  Pulm: no respiratory distress, lungs clear to auscultation bilateral, no wheezes or rhonchi  GI: abdomen soft, non tender, non distended  Skin: feet are well groomed and without ulcer or signs concerning for fungal infection   Assessment & Plan:   Type 2 Diabetes, controlled, with retinopathy and macular edema  No meter with her today, says that morning blood glucose was 90. Highest reading in the last 3 months was 290 but that doesn't happen often, maybe three times in the past three months. Shes been taking 40 units of lantus daily, prescribed is 35 units daily. Had two blood sugars 70 in the past week, these are  asymptomatic and respond to having something to eat.  - hemoglobin A1c 6.8 today  - continue lantus 35 units qHS and victoza 1.2 mg daily  - atorvastatin 40 mg daily for cardiovascular prophylaxis  - foot exam free of concerning findings today  - continues to follow with ophthalmology   Hypertension  Blood pressure with better control since starting spironolactone, I question if she had some component of hyperaldosteronism.  She does report some bilateral ankle swelling at night however this improves with elevation of the legs. - continue amlodipine 10 mg daily, spironolactone 50 mg daily, and olmesartan 40 mg  - continued encouragement of low sodium diet, exercise, and weight loss   Hypokalemia  Potassium improved to 3.5 on repeat check today.  She has not been on any supplementation for potassium or magnesium so this improvement in potassium alongside her now well controlled blood pressure since starting Spironolactone may be an indication of hyperaldosteronism. -Continue Spironolactone -Continue to monitor  Preventative health -Pneumonia 13 vaccine provided today  See Encounters Tab for problem based charting.  Patient discussed with Dr. Lynnae January

## 2018-03-22 NOTE — Patient Instructions (Addendum)
Thank you for coming to the clinic today. It was a pleasure to see you.   For your hypertension - your blood pressure was much better today, keep taking amlodipine 10 mg daily, spironolactone 50 mg daily, and olmesartan 40 mg daily   For your diabetes - keep taking the lantus 35 units daily and victoza 1.2 mg daily, keep checking your blood sugar and bring your meter with you to next office visit   FOLLOW-UP INSTRUCTIONS When: 3-6 months with Dr. Hetty Ely  For: Check up of your diabetes and blood pressure  What to bring: all of your medication bottles, your blood glucose meter    Please call our clinic if you have any questions or concerns, we may be able to help and keep you from a long and expensive emergency room wait. Our clinic and after hours phone number is [phone], there is always someone available.

## 2018-03-23 LAB — POTASSIUM: Potassium: 3.5 mmol/L (ref 3.5–5.2)

## 2018-03-24 ENCOUNTER — Encounter: Payer: Self-pay | Admitting: Internal Medicine

## 2018-03-24 ENCOUNTER — Encounter: Payer: Self-pay | Admitting: *Deleted

## 2018-03-24 NOTE — Assessment & Plan Note (Signed)
Blood pressure with better control since starting spironolactone, I question if she had some component of hyperaldosteronism.  She does report some bilateral ankle swelling at night however this improves with elevation of the legs. - continue amlodipine 10 mg daily, spironolactone 50 mg daily, and olmesartan 40 mg  - continued encouragement of low sodium diet, exercise, and weight loss

## 2018-03-24 NOTE — Assessment & Plan Note (Signed)
No meter with her today, says that morning blood glucose was 90. Highest reading in the last 3 months was 290 but that doesn't happen often, maybe three times in the past three months. Shes been taking 40 units of lantus daily, prescribed is 35 units daily. Had two blood sugars 70 in the past week, these are asymptomatic and respond to having something to eat.  - hemoglobin A1c 6.8 today  - continue lantus 35 units qHS and victoza 1.2 mg daily  - atorvastatin 40 mg daily for cardiovascular prophylaxis  - foot exam free of concerning findings today  - continues to follow with ophthalmology

## 2018-03-24 NOTE — Assessment & Plan Note (Signed)
Potassium improved to 3.5 on repeat check today.  She has not been on any supplementation for potassium or magnesium so this improvement in potassium alongside her now well controlled blood pressure since starting Spironolactone may be an indication of hyperaldosteronism. -Continue Spironolactone -Continue to monitor

## 2018-03-28 ENCOUNTER — Ambulatory Visit
Admission: RE | Admit: 2018-03-28 | Discharge: 2018-03-28 | Disposition: A | Discharge status: Home / Self Care | Payer: Medicare Other | Source: Ambulatory Visit | Attending: *Deleted | Admitting: *Deleted

## 2018-03-28 DIAGNOSIS — Z1231 Encounter for screening mammogram for malignant neoplasm of breast: Secondary | ICD-10-CM | POA: Diagnosis not present

## 2018-03-28 NOTE — Progress Notes (Signed)
Internal Medicine Clinic Attending  Case discussed with Dr. Blum at the time of the visit.  We reviewed the resident's history and exam and pertinent patient test results.  I agree with the assessment, diagnosis, and plan of care documented in the resident's note. 

## 2018-03-29 ENCOUNTER — Telehealth: Payer: Self-pay

## 2018-03-29 NOTE — Telephone Encounter (Signed)
rtc to pt, she states she may be having reaction to pollen but since she got the pneumonia vaccine she has "been stopped up" and coughing. She is ask about fevers, she does not know because she doesn't check. She is advised to increase po fluids, monitor self for fever, N&V, chest pain, diarrhea, short of breath, she is agreeable

## 2018-03-29 NOTE — Telephone Encounter (Signed)
Pt needs to speak with a nurse about an injection that cause her to be sick.  Please call pt back.

## 2018-03-29 NOTE — Telephone Encounter (Signed)
I agree, thank you.

## 2018-04-03 ENCOUNTER — Other Ambulatory Visit: Payer: Self-pay | Admitting: Internal Medicine

## 2018-04-04 MED ORDER — AMLODIPINE BESYLATE 10 MG PO TABS: 10.0000 mg | ORAL_TABLET | Freq: Every day | ORAL | 6 refills | Status: DC

## 2018-04-15 ENCOUNTER — Telehealth: Payer: Self-pay | Admitting: Internal Medicine

## 2018-04-15 NOTE — Telephone Encounter (Signed)
rtc to pt, she is out shopping, will call her back for appt

## 2018-04-15 NOTE — Telephone Encounter (Signed)
Patient having swelling in her legs and would like to know what to do as well as a medication she can take.

## 2018-04-18 NOTE — Telephone Encounter (Signed)
Great, thanks for getting her in tomorrow to evaluate this!

## 2018-04-18 NOTE — Telephone Encounter (Signed)
Patient called back c/o bilateral leg swelling that she thinks is 2/2 taking amlodipine. Would like to stop taking this and start different anti-hypertensive. Unable to come to office today. Appt in [facility] tomorrow at 1045 to discuss. Discussed elevating legs when sitting and avoiding foods high in sodium. Hubbard Hartshorn, RN, BSN

## 2018-04-19 ENCOUNTER — Ambulatory Visit (INDEPENDENT_AMBULATORY_CARE_PROVIDER_SITE_OTHER): Payer: Medicare Other | Admitting: Internal Medicine

## 2018-04-19 ENCOUNTER — Other Ambulatory Visit: Payer: Self-pay

## 2018-04-19 ENCOUNTER — Encounter: Payer: Self-pay | Admitting: Internal Medicine

## 2018-04-19 VITALS — BP 131/64 | HR 71 | Temp 97.8°F | Ht <= 58 in | Wt 173.9 lb

## 2018-04-19 DIAGNOSIS — R600 Localized edema: Secondary | ICD-10-CM

## 2018-04-19 DIAGNOSIS — I10 Essential (primary) hypertension: Secondary | ICD-10-CM | POA: Diagnosis not present

## 2018-04-19 DIAGNOSIS — E876 Hypokalemia: Secondary | ICD-10-CM

## 2018-04-19 DIAGNOSIS — Z79899 Other long term (current) drug therapy: Secondary | ICD-10-CM | POA: Diagnosis not present

## 2018-04-19 LAB — COMPREHENSIVE METABOLIC PANEL
ALBUMIN: 3.2 g/dL — AB (ref 3.5–5.0)
ALT: 12 U/L — ABNORMAL LOW (ref 14–54)
AST: 16 U/L (ref 15–41)
Alkaline Phosphatase: 74 U/L (ref 38–126)
Anion gap: 7 (ref 5–15)
BILIRUBIN TOTAL: 0.5 mg/dL (ref 0.3–1.2)
BUN: 12 mg/dL (ref 6–20)
CHLORIDE: 106 mmol/L (ref 101–111)
CO2: 26 mmol/L (ref 22–32)
Calcium: 8.9 mg/dL (ref 8.9–10.3)
Creatinine, Ser: 1.32 mg/dL — ABNORMAL HIGH (ref 0.44–1.00)
GFR calc Af Amer: 50 mL/min — ABNORMAL LOW (ref >60)
GFR calc non Af Amer: 43 mL/min — ABNORMAL LOW (ref >60)
GLUCOSE: 165 mg/dL — AB (ref 65–99)
Potassium: 3.2 mmol/L — ABNORMAL LOW (ref 3.5–5.1)
SODIUM: 139 mmol/L (ref 135–145)
TOTAL PROTEIN: 6.9 g/dL (ref 6.5–8.1)

## 2018-04-19 LAB — D-DIMER, QUANTITATIVE: D-Dimer, Quant: 0.43 ug/mL-FEU (ref 0.00–0.50)

## 2018-04-19 NOTE — Assessment & Plan Note (Signed)
Hypokalemic on CMP again today.  Will advise patient to continue spironolactone but increase to 50mg  daily as per Dr. Fredrik Cove notes and follow-up for a BMP on 04/22/2018.

## 2018-04-19 NOTE — Progress Notes (Signed)
   CC: lower leg swelling   HPI:[name] is a 62 y.o. female presents today for evaluation of bilateral lower extremity swelling.  Bilateral lower extremity Edema: This has been an ongoing issue now for 2.5-3 months. She described the swelling as intermittent, improving with elevation of the legs, worse on the left but present in both most of the time. She endorsed associated pain in her calf's and below her ankles with the swelling but otherwise denied fever, chills, nausea, vomiting, diarrhea, constipation, myalgias, joint pain, chest pain, dyspnea, cough or dizziness. Less concerned for CHF and cirrhosis at this time.  Plan: Most likely secondary to underlying mild nephrotic syndrome in the setting of newly started amlodipine 10mg . Given the increased proteinuria associated with amlodipine and propensity to cause extremity edema I will discontinue this medication and have her return in two week for a BP check and evaluation for resolution/improvement of her swelling.  CMP for renal function       -Potassium 3.2, which is typical for this patient. Will have her increase her Spironolactone to 50mg  daily as per Dr. Fredrik Cove note.  D-dimer negative, low risk for DVT/VTE, no further intervention indicated  Return in two weeks  Past Medical History:  Diagnosis Date  . Carpal tunnel syndrome, bilateral    confirmed on nerve conduction studies  . Chronic hepatitis C ([place])    considered cured 06/2016 post retroviral therapy   . Diabetes mellitus type 2, uncontrolled ([place])   . Diabetic peripheral neuropathy ([place])   . GERD (gastroesophageal reflux disease)   . History of endometriosis   . History of Helicobacter infection 11/07  . Hyperlipidemia   . Liver fibrosis 02/17/2016   F2/3    Review of Systems:  ROS negative except as per HPI.   Physical Exam:  Vitals:   04/19/18 1057  BP: 131/64  Pulse: 71  Temp: 97.8 F (36.6 C)  TempSrc: Oral  SpO2: 97%  Weight: 173 lb 14.4 oz  (78.9 kg)  Height: 4\' 10"  (1.473 m)   Physical Exam  Constitutional: She is oriented to person, place, and time. She appears well-developed and well-nourished. No distress.  HENT:  Head: Normocephalic and atraumatic.  Cardiovascular: Normal rate and regular rhythm.  No murmur heard. Pulmonary/Chest: Effort normal and breath sounds normal. No stridor. No respiratory distress.  Musculoskeletal: She exhibits no edema or tenderness.  Neurological: She is alert and oriented to person, place, and time.  Skin: Skin is warm. Capillary refill takes less than 2 seconds. She is not diaphoretic.  Vitals reviewed.  Assessment & Plan:   See Encounters Tab for problem based charting.  Patient discussed with Dr. Angelia Mould

## 2018-04-19 NOTE — Patient Instructions (Addendum)
FOLLOW-UP INSTRUCTIONS When: In two weeks For: Evaluation of your high blood pressure and  What to bring: all of your medications  Thank you for your visit to the [facility] today.  I have stopped your Amlodipine due to the lower extremity swelling. We would like to see you back in 2 weeks however, to evaluate your blood pressure and for improvement of the swelling.  In addition, I would like to obtain some lab work to better evaluate your kidney function.  I will call you with any abnormal values.  If between now and your appointment in two weeks you were to develop any concerning features such as unremitting pain in either calf, shortness of breath, fever, chills, dizziness, unrelieved swelling or leg, or other concerns please notify us immediately and or visit the ER if the symptoms are severe.

## 2018-04-19 NOTE — Assessment & Plan Note (Signed)
Please see extremity swelling. Stopping amlodipine, will consider restarting Verapamil due to the nondihydropyridines affect of decreased proteinuria at her two week return visit.

## 2018-04-19 NOTE — Assessment & Plan Note (Signed)
  Bilateral lower extremity Edema: This has been an ongoing issue now for 2.5-3 months. She described the swelling as intermittent, improving with elevation of the legs, worse on the left but present in both most of the time. She endorsed associated pain in her calf's and below her ankles with the swelling but otherwise denied fever, chills, nausea, vomiting, diarrhea, constipation, myalgias, joint pain, chest pain, dyspnea, cough or dizziness. Less concerned for CHF and cirrhosis at this time.  Plan: Most likely secondary to underlying mild nephrotic syndrome in the setting of newly started amlodipine 10mg . Given the increased proteinuria associated with amlodipine and propensity to cause extremity edema I will discontinue this medication and have her return in two week for a BP check and evaluation for resolution/improvement of her swelling.  CMP for renal function       -Potassium 3.2, which is typical for this patient. Will have her increase her Spironolactone to 50mg  daily as per Dr. Fredrik Cove note.  D-dimer negative, low risk for DVT/VTE, no further intervention indicated  Return in two weeks

## 2018-04-21 NOTE — Progress Notes (Signed)
Internal Medicine Clinic Attending  Case discussed with Dr. Harbrecht at the time of the visit.  We reviewed the resident's history and exam and pertinent patient test results.  I agree with the assessment, diagnosis, and plan of care documented in the resident's note.   

## 2018-05-02 ENCOUNTER — Encounter: Payer: Self-pay | Admitting: *Deleted

## 2018-05-11 ENCOUNTER — Ambulatory Visit (INDEPENDENT_AMBULATORY_CARE_PROVIDER_SITE_OTHER): Payer: Medicare Other | Admitting: Internal Medicine

## 2018-05-11 ENCOUNTER — Other Ambulatory Visit: Payer: Self-pay

## 2018-05-11 VITALS — BP 167/86 | HR 71 | Temp 98.1°F | Ht <= 58 in | Wt 175.3 lb

## 2018-05-11 DIAGNOSIS — E1122 Type 2 diabetes mellitus with diabetic chronic kidney disease: Secondary | ICD-10-CM | POA: Diagnosis not present

## 2018-05-11 DIAGNOSIS — Z79899 Other long term (current) drug therapy: Secondary | ICD-10-CM

## 2018-05-11 DIAGNOSIS — N182 Chronic kidney disease, stage 2 (mild): Secondary | ICD-10-CM

## 2018-05-11 DIAGNOSIS — R51 Headache: Secondary | ICD-10-CM

## 2018-05-11 DIAGNOSIS — E876 Hypokalemia: Secondary | ICD-10-CM

## 2018-05-11 DIAGNOSIS — I129 Hypertensive chronic kidney disease with stage 1 through stage 4 chronic kidney disease, or unspecified chronic kidney disease: Secondary | ICD-10-CM

## 2018-05-11 DIAGNOSIS — I10 Essential (primary) hypertension: Secondary | ICD-10-CM

## 2018-05-11 DIAGNOSIS — H538 Other visual disturbances: Secondary | ICD-10-CM

## 2018-05-11 DIAGNOSIS — R600 Localized edema: Secondary | ICD-10-CM

## 2018-05-11 MED ORDER — CHLORTHALIDONE 25 MG PO TABS: 25.0000 mg | ORAL_TABLET | Freq: Every day | ORAL | 0 refills | Status: DC

## 2018-05-11 MED ORDER — SPIRONOLACTONE 50 MG PO TABS: 50.0000 mg | ORAL_TABLET | Freq: Every day | ORAL | 3 refills | Status: DC

## 2018-05-11 NOTE — Assessment & Plan Note (Signed)
Assessment LE swelling resolved after stopping amlodipine.

## 2018-05-11 NOTE — Assessment & Plan Note (Addendum)
Assessment Since discontinuing amlodipine at last visit, patient endorses headaches and blurry vision, but LE swelling has resolved. BP elevated today at 167/86. Reports adherence to spironolactone and olmesartan.  Plan - Start chlorthalidone 25mg  daily - Continue spironolactone 50mg  daily and olmesartan 50mg  daily - RTC in 4 weeks - BMP at next visit

## 2018-05-11 NOTE — Assessment & Plan Note (Signed)
Assessment K 3.2 on last CMP. Spironolactone increased to 50mg  daily at last visit. Patient also on olmesartan. Starting chlorthalidone today. Will get repeat BMP at next visit.  Plan - F/u in 4 weeks, will obtain BMP

## 2018-05-11 NOTE — Progress Notes (Signed)
   CC: follow up of hypertension  HPI:  [name] is a 62 y.o. female with PMH of HTN, CKD, and DM2 who presents for follow up of hypertension.  She was seen in clinic on 5/14 and noted to have BLE swelling after recent initiation of amlodipine, thus this was discontinued. She was continued on olmesartan and instructed to increase spironolactone to 50mg  daily. She returns for a follow up today and notes that since stopping the amlodipine, she has had global headaches and intermittent blurry vision. She denies focal weakness or numbness, no slurred speech. Her LE swelling has completely resolved since being off of the amlodipine. She has previously been on HCTZ and verapamil for her BP.  Past Medical History:  Diagnosis Date  . Carpal tunnel syndrome, bilateral    confirmed on nerve conduction studies  . Chronic hepatitis C ([place])    considered cured 06/2016 post retroviral therapy   . Diabetes mellitus type 2, uncontrolled ([place])   . Diabetic peripheral neuropathy ([place])   . GERD (gastroesophageal reflux disease)   . History of endometriosis   . History of Helicobacter infection 11/07  . Hyperlipidemia   . Liver fibrosis 02/17/2016   F2/3    Review of Systems:   GEN: Negative for fevers NEURO: Positive for headaches and blurry vision CV: Negative for chest pain  PULM: Negative for SOB EXT: Negative for LE swelling  Physical Exam:  Vitals:   05/11/18 1049  BP: (!) 167/86  Pulse: 71  Temp: 98.1 F (36.7 C)  TempSrc: Oral  SpO2: 100%  Weight: 175 lb 4.8 oz (79.5 kg)  Height: 4\' 10"  (1.473 m)   GEN: Sitting comfortably in chair in NAD CV: NR & RR, no m/r/g PULM: CTAB, no wheezes or rales EXT: No LE edema NEURO: CN II-XII grossly intact, no slurred speech or aphasia. Moving all 4 extremities spontaneously.  Assessment & Plan:   See Encounters Tab for problem based charting.  Patient discussed with Dr. Evette Doffing

## 2018-05-11 NOTE — Patient Instructions (Addendum)
FOLLOW-UP INSTRUCTIONS When: 4 weeks For: follow up of BP What to bring: medications   Ms. Flaharty,  It was good to meet you today.  For your blood pressure, continue taking spironolactone 50mg  daily and olmesartan 40mg  daily. We will start a new medicine today called chlorthalidone. Please take chlorthalidone 25mg  daily. Please come back to our office in about 4 weeks to follow up on your blood pressure.  If you have any questions or concerns, call our clinic at [phone] or after hours call [phone] and ask for the internal medicine resident on call.

## 2018-05-12 ENCOUNTER — Other Ambulatory Visit: Payer: Self-pay | Admitting: Internal Medicine

## 2018-05-12 DIAGNOSIS — Z794 Long term (current) use of insulin: Principal | ICD-10-CM

## 2018-05-12 DIAGNOSIS — E11319 Type 2 diabetes mellitus with unspecified diabetic retinopathy without macular edema: Secondary | ICD-10-CM

## 2018-05-12 NOTE — Progress Notes (Signed)
Internal Medicine Clinic Attending  Case discussed with Dr. Huang at the time of the visit.  We reviewed the resident's history and exam and pertinent patient test results.  I agree with the assessment, diagnosis, and plan of care documented in the resident's note. 

## 2018-06-10 ENCOUNTER — Ambulatory Visit (INDEPENDENT_AMBULATORY_CARE_PROVIDER_SITE_OTHER): Payer: Medicare Other | Admitting: Internal Medicine

## 2018-06-10 ENCOUNTER — Encounter (INDEPENDENT_AMBULATORY_CARE_PROVIDER_SITE_OTHER): Payer: Self-pay

## 2018-06-10 VITALS — BP 126/75 | HR 58 | Temp 98.4°F | Wt 173.2 lb

## 2018-06-10 DIAGNOSIS — Z8249 Family history of ischemic heart disease and other diseases of the circulatory system: Secondary | ICD-10-CM

## 2018-06-10 DIAGNOSIS — E876 Hypokalemia: Secondary | ICD-10-CM

## 2018-06-10 DIAGNOSIS — Z79899 Other long term (current) drug therapy: Secondary | ICD-10-CM | POA: Diagnosis not present

## 2018-06-10 DIAGNOSIS — M62838 Other muscle spasm: Secondary | ICD-10-CM | POA: Insufficient documentation

## 2018-06-10 DIAGNOSIS — I10 Essential (primary) hypertension: Secondary | ICD-10-CM | POA: Diagnosis not present

## 2018-06-10 MED ORDER — BACLOFEN 5 MG PO TABS: 5.0000 mg | ORAL_TABLET | Freq: Three times a day (TID) | ORAL | 0 refills | Status: DC | PRN

## 2018-06-10 NOTE — Patient Instructions (Addendum)
We will check your electrolytes today, we may start you on potassium supplementation if your potassium levels are still low.  In the mean time I have prescribed you a medicine called baclofen to help with your cramps.   Muscle Cramps and Spasms Muscle cramps and spasms occur when a muscle or muscles tighten and you have no control over this tightening (involuntary muscle contraction). They are a common problem and can develop in any muscle. The most common place is in the calf muscles of the leg. Muscle cramps and muscle spasms are both involuntary muscle contractions, but there are some differences between the two:  Muscle cramps are painful. They come and go and may last a few seconds to 15 minutes. Muscle cramps are often more forceful and last longer than muscle spasms.  Muscle spasms may or may not be painful. They may also last just a few seconds or much longer.  Certain medical conditions, such as diabetes or Parkinson disease, can make it more likely to develop cramps or spasms. However, cramps or spasms are usually not caused by a serious underlying problem. Common causes include:  Overexertion.  Overuse from repetitive motions, or doing the same thing over and over.  Remaining in a certain position for a long period of time.  Improper preparation, form, or technique while playing a sport or doing an activity.  Dehydration.  Injury.  Side effects of some medicines.  Abnormally low levels of the salts and ions in your blood (electrolytes), especially potassium and calcium. This could happen if you are taking water pills (diuretics) or if you are pregnant.  In many cases, the cause of muscle cramps or spasms is unknown. Follow these instructions at home:  Stay well hydrated. Drink enough fluid to keep your urine clear or pale yellow.  Try massaging, stretching, and relaxing the affected muscle.  If directed, apply heat to tight or tense muscles as often as told by your health  care provider. Use the heat source that your health care provider recommends, such as a moist heat pack or a heating pad. ? Place a towel between your skin and the heat source. ? Leave the heat on for 20-30 minutes. ? Remove the heat if your skin turns bright red. This is especially important if you are unable to feel pain, heat, or cold. You may have a greater risk of getting burned.  If directed, put ice on the affected area. This may help if you are sore or have pain after a cramp or spasm. ? Put ice in a plastic bag. ? Place a towel between your skin and the bag. ? Leavethe ice on for 20 minutes, 2-3 times a day.  Take over-the-counter and prescription medicines only as told by your health care provider.  Pay attention to any changes in your symptoms. Contact a health care provider if:  Your cramps or spasms get more severe or happen more often.  Your cramps or spasms do not improve over time. This information is not intended to replace advice given to you by your health care provider. Make sure you discuss any questions you have with your health care provider. Document Released: 05/15/2002 Document Revised: 12/25/2015 Document Reviewed: 08/27/2015 Elsevier Interactive Patient Education  2018 [employer].

## 2018-06-10 NOTE — Assessment & Plan Note (Signed)
Muscle spasms for the last few months but worse recently, if she coughs or turns she will have a debilitating spasm about every other day.  Had one in her thigh last night.  Mustard and water seems to help quite a bit.  Not associated with night time, could happen any time. Has been persistently hypokalemic, last visit chlorthalidone was added.  I expect this may be contributing.    -will check metabolic panel -potassium supplementation if indicated -will add baclofen for symptomatic relief will start with 5mg  TID PRN

## 2018-06-10 NOTE — Progress Notes (Signed)
Medicine attending: Medical history, presenting problems, physical findings, and medications, reviewed with resident physician Dr Guadlupe Spanish on the day of the patient visit and I concur with his evaluation and management plan.

## 2018-06-10 NOTE — Assessment & Plan Note (Addendum)
BP Readings from Last 3 Encounters:  06/10/18 126/75  05/11/18 (!) 167/86  04/19/18 131/64   Blood pressure much better today compared to visit one month prior.  At that time chlorthalidone 25mg   She is also on olmesartan 40mg  daily and spironolactone 50mg  daily.    -continue regimen above -checking metabolic panel

## 2018-06-10 NOTE — Progress Notes (Signed)
CC: muscle spasms, hypokalemia, HTN follow up  HPI:  [name] is a 62 y.o. female with PMH below.  She is here to address her worsening muscle spasms especially of the lower extremities, hypokalemia, we will also address her HTN.    Please see A&P for status of the patient's chronic medical conditions  Past Medical History:  Diagnosis Date  . Carpal tunnel syndrome, bilateral    confirmed on nerve conduction studies  . Chronic hepatitis C ([place])    considered cured 06/2016 post retroviral therapy   . Diabetes mellitus type 2, uncontrolled ([place])   . Diabetic peripheral neuropathy ([place])   . GERD (gastroesophageal reflux disease)   . History of endometriosis   . History of Helicobacter infection 11/07  . Hyperlipidemia   . Liver fibrosis 02/17/2016   F2/3    Review of Systems:  ROS: Pulmonary: pt denies increased work of breathing, shortness of breath,  Cardiac: pt denies palpitations, chest pain,  Abdominal: pt denies abdominal pain, nausea, vomiting, or diarrhea  Physical Exam:  Vitals:   06/10/18 0953  BP: 126/75  Pulse: (!) 58  Temp: 98.4 F (36.9 C)  TempSrc: Oral  SpO2: 97%  Weight: 173 lb 3.2 oz (78.6 kg)   Physical Exam  Constitutional: No distress.  Cardiovascular: Normal rate, regular rhythm and normal heart sounds. Exam reveals no gallop and no friction rub.  No murmur heard. Pulmonary/Chest: Effort normal and breath sounds normal. No respiratory distress. She has no wheezes. She has no rales. She exhibits no tenderness.  Neurological: She is alert.  Skin: She is not diaphoretic.    Social History   Socioeconomic History  . Marital status: Single    Spouse name: Not on file  . Number of children: 0  . Years of education: 94  . Highest education level: Not on file  Occupational History  . Occupation: Forensic psychologist: [employer]  Social Needs  . Financial resource strain: Not on file  . Food insecurity:    Worry: Not on file   Inability: Not on file  . Transportation needs:    Medical: Not on file    Non-medical: Not on file  Tobacco Use  . Smoking status: Never Smoker  . Smokeless tobacco: Never Used  Substance and Sexual Activity  . Alcohol use: No    Alcohol/week: 0.0 oz  . Drug use: No  . Sexual activity: Not on file  Lifestyle  . Physical activity:    Days per week: Not on file    Minutes per session: Not on file  . Stress: Not on file  Relationships  . Social connections:    Talks on phone: Not on file    Gets together: Not on file    Attends religious service: Not on file    Active member of club or organization: Not on file    Attends meetings of clubs or organizations: Not on file    Relationship status: Not on file  . Intimate partner violence:    Fear of current or ex partner: Not on file    Emotionally abused: Not on file    Physically abused: Not on file    Forced sexual activity: Not on file  Other Topics Concern  . Not on file  Social History Narrative   Financial assistance approved for 100% discount at [facility] and has [facility] card per [employer]   09/26/2010    Family History  Problem Relation Age of Onset  .  Diabetes Mother   . Lung cancer Maternal Uncle   . Breast cancer Maternal Grandmother   . Liver cancer Maternal Grandfather   . Heart disease Brother   . Colon cancer Neg Hx     Assessment & Plan:   See Encounters Tab for problem based charting.  Patient discussed with Dr. Beryle Beams

## 2018-06-10 NOTE — Assessment & Plan Note (Addendum)
Seen on last several metabolic panels,  had chlorthalidone 25mg  added one month prior and her potassium was low before this and therefore I expect her potassium to be low today which could be contributing to pts muscle spasms.    -will check potassium level today if still low pt will need supplementation at this point.  Will also check magnesium levels

## 2018-06-11 LAB — RENAL FUNCTION PANEL
ALBUMIN: 3.9 g/dL (ref 3.6–4.8)
BUN/Creatinine Ratio: 13 (ref 12–28)
BUN: 20 mg/dL (ref 8–27)
CO2: 23 mmol/L (ref 20–29)
CREATININE: 1.53 mg/dL — AB (ref 0.57–1.00)
Calcium: 9.4 mg/dL (ref 8.7–10.3)
Chloride: 104 mmol/L (ref 96–106)
GFR calc Af Amer: 42 mL/min/{1.73_m2} — ABNORMAL LOW (ref >59)
GFR, EST NON AFRICAN AMERICAN: 37 mL/min/{1.73_m2} — AB (ref >59)
Glucose: 140 mg/dL — ABNORMAL HIGH (ref 65–99)
POTASSIUM: 4 mmol/L (ref 3.5–5.2)
Phosphorus: 3.5 mg/dL (ref 2.5–4.5)
Sodium: 141 mmol/L (ref 134–144)

## 2018-06-11 LAB — MAGNESIUM: Magnesium: 1.7 mg/dL (ref 1.6–2.3)

## 2018-06-13 ENCOUNTER — Telehealth: Payer: Self-pay | Admitting: Internal Medicine

## 2018-06-13 NOTE — Telephone Encounter (Signed)
Discussed with patient lab results.  Appears she is somewhat dehydrated but electrolytes within normal range.  Talked with her about better blood sugar control and staying more hydrated to avoid cramps.

## 2018-06-21 ENCOUNTER — Encounter: Payer: Medicare Other | Admitting: Internal Medicine

## 2018-07-10 ENCOUNTER — Other Ambulatory Visit: Payer: Self-pay | Admitting: Internal Medicine

## 2018-07-10 DIAGNOSIS — M62838 Other muscle spasm: Secondary | ICD-10-CM

## 2018-07-10 DIAGNOSIS — E876 Hypokalemia: Secondary | ICD-10-CM

## 2018-07-12 ENCOUNTER — Encounter (INDEPENDENT_AMBULATORY_CARE_PROVIDER_SITE_OTHER): Payer: Self-pay

## 2018-07-12 ENCOUNTER — Ambulatory Visit (INDEPENDENT_AMBULATORY_CARE_PROVIDER_SITE_OTHER): Payer: Medicare Other | Admitting: Internal Medicine

## 2018-07-12 ENCOUNTER — Encounter: Payer: Self-pay | Admitting: Internal Medicine

## 2018-07-12 VITALS — BP 108/74 | HR 72 | Temp 98.1°F | Ht <= 58 in | Wt 168.5 lb

## 2018-07-12 DIAGNOSIS — I129 Hypertensive chronic kidney disease with stage 1 through stage 4 chronic kidney disease, or unspecified chronic kidney disease: Secondary | ICD-10-CM | POA: Diagnosis not present

## 2018-07-12 DIAGNOSIS — R0683 Snoring: Secondary | ICD-10-CM

## 2018-07-12 DIAGNOSIS — K219 Gastro-esophageal reflux disease without esophagitis: Secondary | ICD-10-CM

## 2018-07-12 DIAGNOSIS — E1122 Type 2 diabetes mellitus with diabetic chronic kidney disease: Secondary | ICD-10-CM

## 2018-07-12 DIAGNOSIS — Z794 Long term (current) use of insulin: Secondary | ICD-10-CM

## 2018-07-12 DIAGNOSIS — F419 Anxiety disorder, unspecified: Secondary | ICD-10-CM

## 2018-07-12 DIAGNOSIS — K5900 Constipation, unspecified: Secondary | ICD-10-CM

## 2018-07-12 DIAGNOSIS — E11319 Type 2 diabetes mellitus with unspecified diabetic retinopathy without macular edema: Secondary | ICD-10-CM | POA: Diagnosis not present

## 2018-07-12 DIAGNOSIS — Z8619 Personal history of other infectious and parasitic diseases: Secondary | ICD-10-CM

## 2018-07-12 DIAGNOSIS — I152 Hypertension secondary to endocrine disorders: Secondary | ICD-10-CM

## 2018-07-12 DIAGNOSIS — F331 Major depressive disorder, recurrent, moderate: Secondary | ICD-10-CM

## 2018-07-12 DIAGNOSIS — E1142 Type 2 diabetes mellitus with diabetic polyneuropathy: Secondary | ICD-10-CM | POA: Diagnosis not present

## 2018-07-12 DIAGNOSIS — M62838 Other muscle spasm: Secondary | ICD-10-CM

## 2018-07-12 DIAGNOSIS — I10 Essential (primary) hypertension: Secondary | ICD-10-CM

## 2018-07-12 DIAGNOSIS — L659 Nonscarring hair loss, unspecified: Secondary | ICD-10-CM

## 2018-07-12 DIAGNOSIS — R5383 Other fatigue: Secondary | ICD-10-CM | POA: Diagnosis not present

## 2018-07-12 DIAGNOSIS — E1159 Type 2 diabetes mellitus with other circulatory complications: Secondary | ICD-10-CM

## 2018-07-12 DIAGNOSIS — K740 Hepatic fibrosis: Secondary | ICD-10-CM

## 2018-07-12 DIAGNOSIS — Z79899 Other long term (current) drug therapy: Secondary | ICD-10-CM

## 2018-07-12 DIAGNOSIS — Z6835 Body mass index (BMI) 35.0-35.9, adult: Secondary | ICD-10-CM

## 2018-07-12 DIAGNOSIS — N182 Chronic kidney disease, stage 2 (mild): Secondary | ICD-10-CM

## 2018-07-12 LAB — POCT GLYCOSYLATED HEMOGLOBIN (HGB A1C): HEMOGLOBIN A1C: 7.5 % — AB (ref 4.0–5.6)

## 2018-07-12 LAB — GLUCOSE, CAPILLARY: GLUCOSE-CAPILLARY: 101 mg/dL — AB (ref 70–99)

## 2018-07-12 NOTE — Patient Instructions (Addendum)
Thank you for coming to the clinic today. It was a pleasure to see you.   For your fatigue, I am sending you for a sleep study. Please call our office and ask for an update if you havent heard about scheduling this in the next 1-2 weeks.   The office will reach out to you about scheduling a psychology visit. If you don't hear back from [place] within the next 1-2 weeks please give our front desk a call.   For your diabetes take victoza 1.2 mg daily and increase the lantus to 35 units daily   For the muscle spasm keep using your cream   FOLLOW-UP INSTRUCTIONS When: 3 months with Dr. Hetty Ely For: follow up of your diabetes  What to bring: all of your medication bottles and blood glucose meter   Please call our clinic if you have any questions or concerns, we may be able to help and keep you from a long and expensive emergency room wait. Our clinic and after hours phone number is [phone], the best time to call is Monday through Friday 9 am to 4 pm but there is always someone available 24/7 if you have an emergency. If you need medication refills please notify your pharmacy one week in advance and they will send [place] a request.

## 2018-07-12 NOTE — Progress Notes (Signed)
poct

## 2018-07-12 NOTE — Progress Notes (Signed)
   CC: follow up of diabetes   HPI:  [name] is a 62 y.o. with PMH HTN, T2DM with peripheral neuropathy, severe obesity, depression, CKD 2, GERD, andtreatedhepatitis C with hepatic fibrosis who presents for follow up of diabetes and hypertension. Please see the assessment and plans for the status of the patient chronic medical problems.   Review of Systems:  Refer to history of present illness and assessment and plans for pertinent review of systems, all others reviewed and negative  Physical Exam:  Vitals:   07/12/18 1330  BP: 108/74  Pulse: 72  Temp: 98.1 F (36.7 C)  TempSrc: Oral  SpO2: 100%  Weight: 168 lb 8 oz (76.4 kg)  Height: 4\' 10"  (1.473 m)   General: well appearing, no acute distress  Cardiac: regular rate and rhythm, no murmurs, no calf tenderness or peripheral edema  Pulm: normal work of breathing, lungs clear to auscultation  GI: the abdomen is soft, non tender, non distended  Skin: no rashes of the skin on the exposed arms, legs, and back   Assessment & Plan:   Fatigue  Getting about 6 hours of sleep, sometimes wakes up feeling tired about 2-3 times per week. Morning headache about 2-3 weekly. She does snore, has never been told that she has apneic episodes in her sleep. Urinates 2-3 times per night. Takes 20 minute naps about once daily. She endorses constipation, mood swings, hair loss, dry skin. She has had weight loss, 5 pounds in the past month. She attributes this to the stress of not eating since losing some close friends over the summer. She also endorses anxiety, depression, and forgetfulness. Last CBC had mild anemia. Normal electrolytes but decreased renal function on most recent metabolic panel one month ago.  - will obtain TSH, CBC  - will obtain sleep study  - PHQ 9 is 12 consistent with moderate major depressive disorder  - referral to behavioral health   Muscle spasm  Describes muscle spasm of the thoracic spine and sometimes in the  calves. The spasms are worsened by coughing or turning side to side. The pain continues for about 10 -30 minutes before it resolves. Baclofen shortens the duration of the pain but she has been needed more than the prescribed dose and it does not take the pain away completely. She takes some type of over the counter cream which has been working better than the baclofen.  - D/c Baclofen - recommended continued use of topicals, heat therapy and massage   Hypertension  Blood pressure well controlled 108/74. No med list or medications with her today and she is unsure of the doses.  - continue taking chlorthalidone 25 mg qd, olmesartan 40 mg qd and spironolactone 50 mg qd   Type 2 diabetes  - A1c 7.5 up from 6.8  - continue victoza 1.2 mg qd, lantus 35 qd. She believes that she has been taking 30 units of lantus daily but will increase this to 35  - continue atorvastatin 40 mg qd for MI and stroke prophylaxis  - no meter today, reports lowest blood sugar 80 over the last 3 months and no symptomatic hypoglycemia  - foot exam performed today, thankful for our nursing staff assistance on this   GERD Still having indigestion intermittently but it is improve from the time prior to her starting the omeprazole.  - continue omeprazole daily   See Encounters Tab for problem based charting.  Patient discussed with Dr. Dareen Piano

## 2018-07-13 ENCOUNTER — Other Ambulatory Visit: Payer: Self-pay | Admitting: Internal Medicine

## 2018-07-13 LAB — CBC
HEMATOCRIT: 35.5 % (ref 34.0–46.6)
HEMOGLOBIN: 11.9 g/dL (ref 11.1–15.9)
MCH: 26.9 pg (ref 26.6–33.0)
MCHC: 33.5 g/dL (ref 31.5–35.7)
MCV: 80 fL (ref 79–97)
PLATELETS: 344 10*3/uL (ref 150–450)
RBC: 4.42 x10E6/uL (ref 3.77–5.28)
RDW: 14.9 % (ref 12.3–15.4)
WBC: 8.7 10*3/uL (ref 3.4–10.8)

## 2018-07-14 ENCOUNTER — Encounter: Payer: Self-pay | Admitting: Internal Medicine

## 2018-07-14 DIAGNOSIS — R5383 Other fatigue: Secondary | ICD-10-CM | POA: Insufficient documentation

## 2018-07-14 NOTE — Assessment & Plan Note (Signed)
Still having indigestion intermittently but it is improve from the time prior to her starting the omeprazole.  - continue omeprazole daily

## 2018-07-14 NOTE — Assessment & Plan Note (Signed)
-   A1c 7.5 up from 6.8  - continue victoza 1.2 mg qd, lantus 35 qd. She believes that she has been taking 30 units of lantus daily but will increase this to 35  - continue atorvastatin 40 mg qd for MI and stroke prophylaxis  - no meter today, reports lowest blood sugar 80 over the last 3 months and no symptomatic hypoglycemia  - foot exam performed today, thankful for our nursing staff assistance on this

## 2018-07-14 NOTE — Assessment & Plan Note (Signed)
Blood pressure well controlled 108/74. No med list or medications with her today and she is unsure of the doses.  - continue taking chlorthalidone 25 mg qd, olmesartan 40 mg qd and spironolactone 50 mg qd

## 2018-07-14 NOTE — Assessment & Plan Note (Signed)
Getting about 6 hours of sleep, sometimes wakes up feeling tired about 2-3 times per week. Morning headache about 2-3 weekly. She does snore, has never been told that she has apneic episodes in her sleep. Urinates 2-3 times per night. Takes 20 minute naps about once daily. She endorses constipation, mood swings, hair loss, dry skin. She has had weight loss, 5 pounds in the past month. She attributes this to the stress of not eating since losing some close friends over the summer. She also endorses anxiety, depression, and forgetfulness. Last CBC had mild anemia. Normal electrolytes but decreased renal function on most recent metabolic panel one month ago.  - will obtain TSH, CBC  - will obtain sleep study  - PHQ 9 is 12 consistent with moderate major depressive disorder  - referral to behavioral health

## 2018-07-14 NOTE — Assessment & Plan Note (Signed)
Describes muscle spasm of the thoracic spine and sometimes in the calves. The spasms are worsened by coughing or turning side to side. The pain continues for about 10 -30 minutes before it resolves. Baclofen shortens the duration of the pain but she has been needed more than the prescribed dose and it does not take the pain away completely. She takes some type of over the counter cream which has been working better than the baclofen.  - D/c Baclofen - recommended continued use of topicals, heat therapy and massage

## 2018-07-15 NOTE — Progress Notes (Signed)
Internal Medicine Clinic Attending  Case discussed with Dr. Blum at the time of the visit.  We reviewed the resident's history and exam and pertinent patient test results.  I agree with the assessment, diagnosis, and plan of care documented in the resident's note. 

## 2018-07-19 ENCOUNTER — Telehealth: Payer: Self-pay | Admitting: *Deleted

## 2018-07-19 NOTE — Telephone Encounter (Signed)
Called pt and pt stated that she had a hysterectomy and she has been spotting for several days now.  I explained to the pt that she will need to schedule an appt for evaluation.  Pt stated that she will call in the am to schedule an appt.

## 2018-07-19 NOTE — Telephone Encounter (Signed)
Pt left VM message yesterday stating that she is a former pt of Dr. Kalman Shan in this office.  She has been having some problems and would like to discuss them with a nurse.  Please call back.

## 2018-07-22 ENCOUNTER — Encounter: Payer: Self-pay | Admitting: Obstetrics and Gynecology

## 2018-07-22 ENCOUNTER — Ambulatory Visit (INDEPENDENT_AMBULATORY_CARE_PROVIDER_SITE_OTHER): Payer: Medicare Other | Admitting: Obstetrics and Gynecology

## 2018-07-22 ENCOUNTER — Other Ambulatory Visit (HOSPITAL_COMMUNITY)
Admission: RE | Admit: 2018-07-22 | Discharge: 2018-07-22 | Disposition: A | Discharge status: Home / Self Care | Payer: Medicare Other | Source: Ambulatory Visit | Attending: Obstetrics and Gynecology | Admitting: Obstetrics and Gynecology

## 2018-07-22 DIAGNOSIS — N939 Abnormal uterine and vaginal bleeding, unspecified: Secondary | ICD-10-CM | POA: Diagnosis not present

## 2018-07-22 DIAGNOSIS — R102 Pelvic and perineal pain: Secondary | ICD-10-CM | POA: Diagnosis not present

## 2018-07-22 DIAGNOSIS — N952 Postmenopausal atrophic vaginitis: Secondary | ICD-10-CM | POA: Diagnosis not present

## 2018-07-22 MED ORDER — NITROFURANTOIN MONOHYD MACRO 100 MG PO CAPS: 100.0000 mg | ORAL_CAPSULE | Freq: Two times a day (BID) | ORAL | 1 refills | Status: DC

## 2018-07-22 NOTE — Addendum Note (Signed)
Addended by: Bethanne Ginger on: 07/22/2018 12:29 PM   Modules accepted: Orders

## 2018-07-22 NOTE — Progress Notes (Signed)
Pt states was bleeding but it has stopped since yesterday 8/15 but still is having cramping.

## 2018-07-22 NOTE — Patient Instructions (Signed)
Atrophic Vaginitis Atrophic vaginitis is when the tissues that line the vagina become dry and thin. This is caused by a drop in estrogen. Estrogen helps:  To keep the vagina moist.  To make a clear fluid that helps: ? To lubricate the vagina for sex. ? To protect the vagina from infection.  If the lining of the vagina is dry and thin, it may:  Make sex painful. It may also cause bleeding.  Cause a feeling of: ? Burning. ? Irritation. ? Itchiness.  Make an exam of your vagina painful. It may also cause bleeding.  Make you lose interest in sex.  Cause a burning feeling when you pee.  Make your vaginal fluid (discharge) brown or yellow.  For some women, there are no symptoms. This condition is most common in women who do not get their regular menstrual periods anymore (menopause). This often starts when a woman is 45-55 years old. Follow these instructions at home:  Take medicines only as told by your doctor. Do not use any herbal or alternative medicines unless your doctor says it is okay.  Use over-the-counter products for dryness only as told by your doctor. These include: ? Creams. ? Lubricants. ? Moisturizers.  Do not douche.  Do not use products that can make your vagina dry. These include: ? Scented feminine sprays. ? Scented tampons. ? Scented soaps.  If it hurts to have sex, tell your sexual partner. Contact a doctor if:  Your discharge looks different than normal.  Your vagina has an unusual smell.  You have new symptoms.  Your symptoms do not get better with treatment.  Your symptoms get worse. This information is not intended to replace advice given to you by your health care provider. Make sure you discuss any questions you have with your health care provider. Document Released: 05/11/2008 Document Revised: 04/30/2016 Document Reviewed: 11/14/2014 Elsevier Interactive Patient Education  2018 [employer]  

## 2018-07-25 NOTE — Progress Notes (Signed)
Patient ID: Nancy [name], female   DOB: Jan 20, 1956, 62 y.o.   MRN: [id number] [name] presents for evaluation of vaginal bleeding. She reports vaginal bleeding a few weeks ago. She also has had some lower abd/pelvic cramping at times. No associated factors. Not sexual active Had TVH over 30 yrs ago. No recent pap. Denies any bowel or bladder dysfunction.   PE AF VSS Lungs clear Heart RRR Abd soft + BS GU Nl EGBUS atrophic vaginal mucosa, no frank bleeding noted, pap smear obtained, uterus absent, no adnexal masses, + bladder tenderness  A/P Vaginal Bleeding        Vaginal atrophy        UTI  I suspect pt's vaginal bleeding is related to atrophy. Will check GYN U/S. Pain I suspect is related to her bladder and I will treat her for a UTI. Discussed Tx options for vaginal atrophy. Pt undecided at present. Will discuss further at follow up appt after GYN U/S.

## 2018-07-26 LAB — CYTOLOGY - PAP
Diagnosis: NEGATIVE
HPV: NOT DETECTED

## 2018-08-03 ENCOUNTER — Other Ambulatory Visit: Payer: Self-pay | Admitting: Internal Medicine

## 2018-08-03 DIAGNOSIS — Z760 Encounter for issue of repeat prescription: Secondary | ICD-10-CM

## 2018-08-04 ENCOUNTER — Telehealth: Payer: Self-pay | Admitting: Internal Medicine

## 2018-08-04 NOTE — Telephone Encounter (Signed)
Next appt scheduled 11/5 with PCP.

## 2018-08-16 ENCOUNTER — Ambulatory Visit (HOSPITAL_BASED_OUTPATIENT_CLINIC_OR_DEPARTMENT_OTHER): Payer: Medicare Other | Admitting: Internal Medicine

## 2018-08-16 VITALS — Ht <= 58 in | Wt 167.0 lb

## 2018-08-23 ENCOUNTER — Ambulatory Visit (INDEPENDENT_AMBULATORY_CARE_PROVIDER_SITE_OTHER): Payer: Medicare Other | Admitting: Dietician

## 2018-08-23 ENCOUNTER — Other Ambulatory Visit: Payer: Self-pay | Admitting: Dietician

## 2018-08-23 ENCOUNTER — Encounter: Payer: Self-pay | Admitting: Dietician

## 2018-08-23 DIAGNOSIS — E11319 Type 2 diabetes mellitus with unspecified diabetic retinopathy without macular edema: Secondary | ICD-10-CM

## 2018-08-23 DIAGNOSIS — Z794 Long term (current) use of insulin: Secondary | ICD-10-CM | POA: Diagnosis not present

## 2018-08-23 DIAGNOSIS — Z713 Dietary counseling and surveillance: Secondary | ICD-10-CM | POA: Diagnosis not present

## 2018-08-23 NOTE — Progress Notes (Signed)
Documentation for Freestyle Libre Pro Continuous glucose monitoring Freestyle Libre Pro CGM sensor placed and started on Nancy [name] who was identified by name and date of birth.  Patient was educated about wearing sensor, keeping food, activity and medication log and when to call office.She was educated about how to care for the sensor and not to have an MRI, CT or Diathermy while wearing the sensorwhat she can learn from the Continuous glucose monitoring. Follow up was arranged with the patient for 1 week to see a doctor and diabetes educator.  Lot #:[id number] Serial #:[id number] Expiration Date:03/07/2019 Debera Lat, RD 08/23/2018 1:41 PM.

## 2018-08-23 NOTE — Progress Notes (Signed)
Referral request for Continuous glucose monitoring

## 2018-08-23 NOTE — Patient Instructions (Signed)
Please record the time, amount and what food drinks and activities you have while wearing the continuous glucose monitor(CGM) in the folder provided.  Bring the folder with you to follow up appointments  Do not have a CT or an MRI while wearing the CGM.   Please make an appointment for 1 week with me and a doctor for the first of two CGM downloads..   You will also return in 2 weeks to have your second download and the CGM removed.  

## 2018-08-24 ENCOUNTER — Other Ambulatory Visit: Payer: Self-pay

## 2018-08-24 ENCOUNTER — Ambulatory Visit (INDEPENDENT_AMBULATORY_CARE_PROVIDER_SITE_OTHER): Payer: Medicare Other | Admitting: Internal Medicine

## 2018-08-24 ENCOUNTER — Encounter: Payer: Self-pay | Admitting: Internal Medicine

## 2018-08-24 VITALS — BP 118/69 | HR 77 | Temp 98.0°F | Wt 168.6 lb

## 2018-08-24 DIAGNOSIS — E1159 Type 2 diabetes mellitus with other circulatory complications: Secondary | ICD-10-CM | POA: Diagnosis not present

## 2018-08-24 DIAGNOSIS — M62838 Other muscle spasm: Secondary | ICD-10-CM | POA: Diagnosis not present

## 2018-08-24 DIAGNOSIS — Z23 Encounter for immunization: Secondary | ICD-10-CM

## 2018-08-24 DIAGNOSIS — I152 Hypertension secondary to endocrine disorders: Secondary | ICD-10-CM

## 2018-08-24 DIAGNOSIS — Z8639 Personal history of other endocrine, nutritional and metabolic disease: Secondary | ICD-10-CM

## 2018-08-24 DIAGNOSIS — I10 Essential (primary) hypertension: Secondary | ICD-10-CM | POA: Diagnosis not present

## 2018-08-24 DIAGNOSIS — Z79899 Other long term (current) drug therapy: Secondary | ICD-10-CM

## 2018-08-24 MED ORDER — DIAZEPAM 5 MG PO TABS: 5.0000 mg | ORAL_TABLET | Freq: Every day | ORAL | 0 refills | Status: DC

## 2018-08-24 NOTE — Patient Instructions (Signed)
Thank you for visiting clinic today. We will try diazepam or Valium 5 mg at bedtime to see if that will help with your cramps.  Give this a trial of 2 weeks, if it does not work please follow-up in the clinic. We are checking your potassium and magnesium today, will call you with any abnormal results.

## 2018-08-24 NOTE — Assessment & Plan Note (Addendum)
She continued to experience spasms of her both lower extremities, abdomen and thoracic area.  Her pain lasted for about 15 to 30 minutes and is interfering with her ADLs and sleep.  Baclofen did not help and mustard has stopped helping.  -We will try diazepam 5 mg at bedtime and see if that will help with her symptoms. -If that does not help we can try gabapentin. -We will check BMP and magnesium today.  Addendum.  Her potassium and magnesium levels are within normal limit.  Her kidney functions are deteriorating with elevation in creatinine. Patient was on Spironolactone and chlorthalidone along with olmesartan.  Called the patient and advised her to stop taking chlorthalidone. She was also advised to keep herself well-hydrated. She should follow-up in 2 weeks for a repeat BMP.

## 2018-08-24 NOTE — Progress Notes (Signed)
   CC: Muscle spasms.  HPI:  [name] is a 62 y.o. with past medical history as listed below came to the clinic with complaint of worsening muscle spasms.  Patient has a history of muscle spasms involving her lower extremity, abdomen and thoracic area.  She gets spasms with coughing and with certain body movements.  According to patient she used to get these spasms many year ago and then they resolved on its own, stayed in remission for couple of years and now for the past 1 year she is experiencing worsening of her spasms.  She gets the spasms couple of times almost daily.  She also gets it 2-3 times most of the nights and they interfere with her sleep.  Seen in clinic with similar complaints multiple times. She tried baclofen with no relief. Initially mustard cream was helping, now it does not help. She had an history of mild hypokalemia but her last 2 labs were within normal limits.  Past Medical History:  Diagnosis Date  . Carpal tunnel syndrome, bilateral    confirmed on nerve conduction studies  . Chronic hepatitis C ([place])    considered cured 06/2016 post retroviral therapy   . Diabetes mellitus type 2, uncontrolled ([place])   . Diabetic peripheral neuropathy ([place])   . GERD (gastroesophageal reflux disease)   . History of endometriosis   . History of Helicobacter infection 11/07  . Hyperlipidemia   . Liver fibrosis 02/17/2016   F2/3    Review of Systems: Negative except mentioned in HPI.  Physical Exam:  Vitals:   08/24/18 1321  BP: 118/69  Pulse: 77  Temp: 98 F (36.7 C)  TempSrc: Oral  SpO2: 100%  Weight: 168 lb 9.6 oz (76.5 kg)   General: Vital signs reviewed.  Patient is well-developed and well-nourished, in no acute distress and cooperative with exam.  Head: Normocephalic and atraumatic. Cardiovascular: RRR, S1 normal, S2 normal, no murmurs, gallops, or rubs. Pulmonary/Chest: Clear to auscultation bilaterally, no wheezes, rales, or rhonchi. Abdominal:  Soft, non-tender, non-distended, BS +,  Extremities: No lower extremity edema bilaterally,  pulses symmetric and intact bilaterally. No cyanosis or clubbing. Neurological: A&O x3, Strength is normal and symmetric bilaterally, cranial nerve II-XII are grossly intact, no focal motor deficit, sensory intact to light touch bilaterally.  Skin: Warm, dry and intact. No rashes or erythema. Psychiatric: Normal mood and affect. speech and behavior is normal. Cognition and memory are normal.  Assessment & Plan:   See Encounters Tab for problem based charting.  Patient discussed with Dr. Beryle Beams.

## 2018-08-24 NOTE — Assessment & Plan Note (Signed)
BP Readings from Last 3 Encounters:  08/24/18 118/69  07/22/18 120/75  07/12/18 108/74   She was normotensive today.  Continue current management.

## 2018-08-25 LAB — BMP8+ANION GAP
ANION GAP: 17 mmol/L (ref 10.0–18.0)
BUN / CREAT RATIO: 17 (ref 12–28)
BUN: 30 mg/dL — ABNORMAL HIGH (ref 8–27)
CHLORIDE: 102 mmol/L (ref 96–106)
CO2: 22 mmol/L (ref 20–29)
Calcium: 9.5 mg/dL (ref 8.7–10.3)
Creatinine, Ser: 1.75 mg/dL — ABNORMAL HIGH (ref 0.57–1.00)
GFR, EST AFRICAN AMERICAN: 36 mL/min/{1.73_m2} — AB (ref >59)
GFR, EST NON AFRICAN AMERICAN: 31 mL/min/{1.73_m2} — AB (ref >59)
GLUCOSE: 136 mg/dL — AB (ref 65–99)
POTASSIUM: 4.3 mmol/L (ref 3.5–5.2)
SODIUM: 141 mmol/L (ref 134–144)

## 2018-08-25 LAB — MAGNESIUM: Magnesium: 1.9 mg/dL (ref 1.6–2.3)

## 2018-08-25 NOTE — Progress Notes (Signed)
Okay, do I need to create this order?

## 2018-08-25 NOTE — Progress Notes (Signed)
No, you can sign the one in this note. If you don't see it, you should be able to find it in patient station. Thank you!

## 2018-08-25 NOTE — Progress Notes (Signed)
Medicine attending: Medical history, presenting problems, physical findings, and medications, reviewed with resident physician Dr Sumayya Amin on the day of the patient visit and I concur with her evaluation and management plan. 

## 2018-08-26 ENCOUNTER — Telehealth: Payer: Self-pay | Admitting: *Deleted

## 2018-08-26 NOTE — Telephone Encounter (Signed)
Call from pt - stated the doctor had called and told her to stop taking one of her medications but she did not understand which one. I read Dr Latina Craver note on 9/18 "advised her to stop taking chlorthalidone" which I told pt. She wanted to know why; explained affecting her  Kidneys,told her  "Her kidney functions are deteriorating with elevation in creatinine." She stated ok.

## 2018-08-26 NOTE — Telephone Encounter (Signed)
Thank you for clearing that.

## 2018-08-30 ENCOUNTER — Other Ambulatory Visit: Payer: Self-pay

## 2018-08-30 ENCOUNTER — Encounter: Payer: Self-pay | Admitting: Dietician

## 2018-08-30 ENCOUNTER — Ambulatory Visit (INDEPENDENT_AMBULATORY_CARE_PROVIDER_SITE_OTHER): Payer: Medicare Other | Admitting: Internal Medicine

## 2018-08-30 ENCOUNTER — Ambulatory Visit (INDEPENDENT_AMBULATORY_CARE_PROVIDER_SITE_OTHER): Payer: Medicare Other | Admitting: Dietician

## 2018-08-30 ENCOUNTER — Encounter: Payer: Self-pay | Admitting: Internal Medicine

## 2018-08-30 DIAGNOSIS — Z794 Long term (current) use of insulin: Secondary | ICD-10-CM

## 2018-08-30 DIAGNOSIS — I129 Hypertensive chronic kidney disease with stage 1 through stage 4 chronic kidney disease, or unspecified chronic kidney disease: Secondary | ICD-10-CM

## 2018-08-30 DIAGNOSIS — E11319 Type 2 diabetes mellitus with unspecified diabetic retinopathy without macular edema: Secondary | ICD-10-CM | POA: Diagnosis not present

## 2018-08-30 DIAGNOSIS — E1142 Type 2 diabetes mellitus with diabetic polyneuropathy: Secondary | ICD-10-CM

## 2018-08-30 DIAGNOSIS — E1122 Type 2 diabetes mellitus with diabetic chronic kidney disease: Secondary | ICD-10-CM | POA: Diagnosis not present

## 2018-08-30 DIAGNOSIS — Z713 Dietary counseling and surveillance: Secondary | ICD-10-CM

## 2018-08-30 DIAGNOSIS — N182 Chronic kidney disease, stage 2 (mild): Secondary | ICD-10-CM | POA: Diagnosis not present

## 2018-08-30 DIAGNOSIS — Z6835 Body mass index (BMI) 35.0-35.9, adult: Secondary | ICD-10-CM

## 2018-08-30 DIAGNOSIS — E11649 Type 2 diabetes mellitus with hypoglycemia without coma: Secondary | ICD-10-CM

## 2018-08-30 DIAGNOSIS — K219 Gastro-esophageal reflux disease without esophagitis: Secondary | ICD-10-CM

## 2018-08-30 DIAGNOSIS — K740 Hepatic fibrosis: Secondary | ICD-10-CM

## 2018-08-30 DIAGNOSIS — Z8619 Personal history of other infectious and parasitic diseases: Secondary | ICD-10-CM

## 2018-08-30 LAB — GLUCOSE, CAPILLARY
GLUCOSE-CAPILLARY: 65 mg/dL — AB (ref 70–99)
Glucose-Capillary: 85 mg/dL (ref 70–99)

## 2018-08-30 MED ORDER — LIRAGLUTIDE 18 MG/3ML ~~LOC~~ SOPN: 1.2000 mg | PEN_INJECTOR | Freq: Every day | SUBCUTANEOUS | 11 refills | Status: DC

## 2018-08-30 MED ORDER — INSULIN GLARGINE 100 UNIT/ML ~~LOC~~ SOLN: SUBCUTANEOUS | 11 refills | Status: DC

## 2018-08-30 NOTE — Progress Notes (Signed)
Hypoglycemic Event  CBG: 65  Treatment: Glucose tablets  Symptoms: None  Follow-up CBG: Time: 2:10 PM CBG Result:*%  Possible Reasons for Event: Had not eaten   Comments/MD notified: Dr. Larena Glassman, Trey Sailors

## 2018-08-30 NOTE — Progress Notes (Signed)
   CC: follow up after continuous blood glucose monitoring, week one   HPI:  [name] is a 62 y.o. with PMH HTN, T2DM with peripheral neuropathy, severe obesity, depression, CKD 2, GERD, andtreatedhepatitis C with hepatic fibrosiswho presents for follow up of continuous blood glucose monitor. Please see the assessment and plans for the status of the patient chronic medical problems.   Past Medical History:  Diagnosis Date  . Carpal tunnel syndrome, bilateral    confirmed on nerve conduction studies  . Chronic hepatitis C ([place])    considered cured 06/2016 post retroviral therapy   . Diabetes mellitus type 2, uncontrolled ([place])   . Diabetic peripheral neuropathy ([place])   . GERD (gastroesophageal reflux disease)   . History of endometriosis   . History of Helicobacter infection 11/07  . Hyperlipidemia   . Liver fibrosis 02/17/2016   F2/3    Review of Systems:  Refer to history of present illness and assessment and plans for pertinent review of systems, all others reviewed and negative  Physical Exam:  Vitals:   08/30/18 1324  BP: 111/74  Pulse: 72  Temp: 97.7 F (36.5 C)  TempSrc: Oral  SpO2: 100%  Weight: 169 lb 3.2 oz (76.7 kg)  Height: 4\' 10"  (1.473 m)   General: well appearing, no acute distress  Cardiac: regular rate and rhythm, no murmur, no peripheral edema  Pulm: normal work of breathing, lungs clear to auscultation bilateral  GI: abdomen soft, non tender, non distended  Skin: feet are free of skin changes, no concerning lesions   Assessment & Plan:   Diabetes Mellitus  CGM readings over the past week show an average blood glucose of 110. 80% of readings are within the target range of 70-180, 70% above 180, and 13% below 70. Hypoglycemia is a consistent issue, especially after lunch time. There is a spike in the blood glucose prior to lunch, it was uncovered that this is after eating a bowl of frosted flakes each morning. She was provided recommendations  for alternative lower sugar breakfast options and will try these going into next week. We will also decrease her lantus dose to combat this persistent hypoglycemia.  - decrease lantus to 32 units daily ( she was actually taking 36 units daily although rx reads 35)  - continue victoza 1.2 mg daily - return to clinic in one week for CGM check  See Encounters Tab for problem based charting.  Patient discussed with Dr. Rebeca Alert

## 2018-08-30 NOTE — Patient Instructions (Signed)
Thank you for coming to the clinic today. It was a pleasure to see you.   For your diabetes, please decrease your lantus to 32 units daily, continue to take the victoza 1.2 mg daily. Please make the changes that were recommended by your nutritionist and follow up with [place] in one week.   FOLLOW-UP INSTRUCTIONS When: 1 week in the acute care clinic  For: follow up of your continuous blood glucose monitor  What to bring: all of your medication bottles and injection medications   Please call the internal medicine center clinic if you have any questions or concerns, we may be able to help and keep you from a long and expensive emergency room wait. Our clinic and after hours phone number is [phone], the best time to call is Monday through Friday 9 am to 4 pm but there is always someone available 24/7 if you have an emergency. If you need medication refills please notify your pharmacy one week in advance and they will send [place] a request.

## 2018-08-30 NOTE — Progress Notes (Signed)
  Medical Nutrition Therapy:  Appt start time: 1330 end time:  1400. Visit # 1  Assessment:  Primary concerns today: glycemic control/prevention of hypoglycemia  Ms. Mattice presented with her mother for week 1 CGM download. The report was reviewed with she and her mother. She had considerable blood sugar spikes after eating cold cereal, but not fruit unless she ate more than 1 serving. She was also having some low blood sugars without symptoms which could also have been encouraging larger than her usual carb intakes.  Preferred Learning Style:No preference indicated  Learning Readiness:Contemplating  ANTHROPOMETRICS: Estimated body mass index is 35.36 kg/m as calculated from the following:   Height as of an earlier encounter on 08/30/18: 4\' 10"  (1.473 m).   Weight as of an earlier encounter on 08/30/18: 169 lb 3.2 oz (76.7 kg).  WEIGHT HISTORY:  Wt Readings from Last 5 Encounters:  08/30/18 169 lb 3.2 oz (76.7 kg)  08/24/18 168 lb 9.6 oz (76.5 kg)  08/16/18 167 lb (75.8 kg)  07/22/18 167 lb (75.8 kg)  07/12/18 168 lb 8 oz (76.4 kg)   SLEEP:need to assess at future visit MEDICATIONS: lantus 36 units and victoza 1.2 units daily in the morning.  BLOOD SUGAR: Carmyn Redel wore the CGM for 8 days. The average reading was 110, % time in target was 80, % time below target was 13, and % time above target was. 7. Intervention will be to spoke to Dr. Hetty Ely about decreasing insulin and we spent time discussing alternative foods to the ones that seemed to spike her blood sugar. The patient will be scheduled to see diabetes educator for a final appointment.   DIETARY INTAKE: Usual eating pattern includes 2 meals and 2 snacks per day. If she eats breakfast she misses lunch, if she misses breakfast , she eats lunch. She always eats dinner, but sometimes it is just fruit Everyday foods include fruit, cold cereal, sandwiches    Usual physical activity: activities of daily living, works around  house  Progress Towards Goal(s):  In progress.   Nutritional Diagnosis:  NI-5.8.2 Excessive carbohydrate intake As related to spikes in blood sugar > 180 mg/dl after meals.  As evidenced by  the cgm download.    Intervention:  Nutrition education about other food options that may be acceptable to her and have less impact on blood sugars. Coordination of care: discussed care with DR. Hetty Ely recommending small decease in insulin to prevent hypoglycemia  Teaching Method Utilized: Visual,Auditory,Hands on Handouts given during visit include: AVS, Meter and CGM download Barriers to learning/adherence to lifestyle change: competing values Demonstrated degree of understanding via:  Teach Back   Monitoring/Evaluation:  Dietary intake, exercise, meter and sensor, and body weight in 1 week(s).  Debera Lat, RD 08/30/2018 4:34 PM.

## 2018-08-30 NOTE — Patient Instructions (Signed)
You did great keeping records and taking your medicine and controlling your .  To try to decrease the spike after frosted flakes you may want to try a different breakfast or a different cereal like   Muesli Special K Oatmeal- steel cut oats or Old fashioned Oats Granola- low sugar  Make an appointment to see me next week for your final download and to take the sensor off.   Butch Penny [phone]

## 2018-08-31 ENCOUNTER — Encounter: Payer: Self-pay | Admitting: Internal Medicine

## 2018-08-31 MED ORDER — INSULIN PEN NEEDLE 32G X 4 MM MISC: 11 refills | Status: DC

## 2018-08-31 MED ORDER — "INSULIN SYRINGE-NEEDLE U-100 31G X 15/64"" 0.5 ML MISC": 11 refills | Status: DC

## 2018-08-31 NOTE — Progress Notes (Signed)
Internal Medicine Clinic Attending  Case discussed with Dr. Blum at the time of the visit.  We reviewed the resident's history and exam and pertinent patient test results.  I agree with the assessment, diagnosis, and plan of care documented in the resident's note.  Alexander Raines, M.D., Ph.D.  

## 2018-08-31 NOTE — Assessment & Plan Note (Signed)
CGM readings over the past week show an average blood glucose of 110. 80% of readings are within the target range of 70-180, 70% above 180, and 13% below 70. Hypoglycemia is a consistent issue, especially after lunch time. There is a spike in the blood glucose prior to lunch, it was uncovered that this is after eating a bowl of frosted flakes each morning. She was provided recommendations for alternative lower sugar breakfast options and will try these going into next week. We will also decrease her lantus dose to combat this persistent hypoglycemia.  - decrease lantus to 32 units daily ( she was actually taking 36 units daily although rx reads 35)  - continue victoza 1.2 mg daily - return to clinic in one week for CGM check

## 2018-09-06 ENCOUNTER — Ambulatory Visit (INDEPENDENT_AMBULATORY_CARE_PROVIDER_SITE_OTHER): Payer: Medicare Other | Admitting: Internal Medicine

## 2018-09-06 ENCOUNTER — Ambulatory Visit (INDEPENDENT_AMBULATORY_CARE_PROVIDER_SITE_OTHER): Payer: Medicare Other | Admitting: Dietician

## 2018-09-06 ENCOUNTER — Other Ambulatory Visit: Payer: Self-pay

## 2018-09-06 DIAGNOSIS — Z713 Dietary counseling and surveillance: Secondary | ICD-10-CM | POA: Diagnosis not present

## 2018-09-06 DIAGNOSIS — Z794 Long term (current) use of insulin: Secondary | ICD-10-CM

## 2018-09-06 DIAGNOSIS — Z6835 Body mass index (BMI) 35.0-35.9, adult: Secondary | ICD-10-CM

## 2018-09-06 DIAGNOSIS — E11319 Type 2 diabetes mellitus with unspecified diabetic retinopathy without macular edema: Secondary | ICD-10-CM

## 2018-09-06 DIAGNOSIS — G4730 Sleep apnea, unspecified: Secondary | ICD-10-CM

## 2018-09-06 HISTORY — DX: Sleep apnea, unspecified: G47.30

## 2018-09-06 NOTE — Progress Notes (Signed)
  Medical Nutrition Therapy:  Appt start time: 1100 end time:  1130. Visit # 2  Assessment:  Nancy [name] primary concerns today are  glycemic  And weight control. Nancy [name] presented with her mother for week 2 CGM download. The report was reviewed with she and her mother. The sensor became dislodged after day 10, so there are only 3 additional days of blood sugar recoding. Sh had less blood sugar spikes after eating a bacon an egg sandwich, but more on two days last week from drinking regular sodas. Although the data is limited it appears that she also had less hypoglycemia.   ANTHROPOMETRICS: Estimated body mass index is 35.43 kg/m as calculated from the following:   Height as of an earlier encounter on 09/06/18: 4\' 10"  (1.473 m).   Weight as of an earlier encounter on 09/06/18: 169 lb 8 oz (76.9 kg). SLEEP:she reports that her sleep is good bedtime 9-10 Pm wake time 9-10 AM, she is undergoing a sleep evaluation in the near future MEDICATIONS: lantus 32 units at bedtime and victoza 1.2 units daily in the morning.  BLOOD SUGAR: Nancy [name] wore the CGM for 10 days. The average reading was 124, % time in target was 75, % time below target was 10, and % time above target was 15.Intervention is to encourage the egg sandwich for breakfast, water or drinks with less or not sugar. Spoke to Dr. Philipp Ovens about her CGM data and her dietary interventions.  Progress Towards Goal(s):  In progress.   Nutritional Diagnosis:  NI-5.8.2 Excessive carbohydrate intake As related to spikes in blood sugar > 180 mg/dl after meals.  As evidenced by  the cgm download.    Intervention:  Nutrition education about other food and beverage options that may be acceptable to her and have less impact on blood sugars. Coordination of care: discussed care with Dr. Philipp Ovens  Teaching Method Utilized: Visual,Auditory,Hands on Handouts given during visit include: AVS, Meter and CGM download Barriers to learning/adherence to  lifestyle change: competing values Demonstrated degree of understanding via:  Teach Back   Monitoring/Evaluation:  Dietary intake, exercise, meter and sensor, and body weight in 3 month(s) to decide if another wearing of the Continuous glucose monitor would be helpful.  Debera Lat, RD 09/06/2018 11:36 AM.

## 2018-09-06 NOTE — Assessment & Plan Note (Addendum)
Patient is here for CGM follow-up.  Unfortunately monitor became dislodged and we only have data a total of 3 days.  Average CBG over this time was approximately 160.  Patient was within target range approximately 50% of the time.  There was 1 episode of hypoglycemia. Patient reports she was asymptomatic. -- Continue lantus 32 units daily  -- Continue victoza 1.2 mg daily -- Follow up 1 month with PCP for A1c

## 2018-09-06 NOTE — Patient Instructions (Addendum)
Ms. Rena,  It was a pleasure to see you. Please continue to take your medicine as previously prescribed. Make an appointment to follow up in 4-6 weeks with your primary care doctor. If you have any questions or concerns, call our clinic at [phone] or after hours call [phone] and ask for the internal medicine resident on call. Thank you!  Dr. Philipp Ovens

## 2018-09-06 NOTE — Progress Notes (Signed)
   CC: Diabetes follow up  HPI:  [name] is a 62 y.o. female with past medical history outlined below here for diabetes follow up. For the details of today's visit, please refer to the assessment and plan.  Past Medical History:  Diagnosis Date  . Carpal tunnel syndrome, bilateral    confirmed on nerve conduction studies  . Chronic hepatitis C ([place])    considered cured 06/2016 post retroviral therapy   . Diabetes mellitus type 2, uncontrolled ([place])   . Diabetic peripheral neuropathy ([place])   . GERD (gastroesophageal reflux disease)   . History of endometriosis   . History of Helicobacter infection 11/07  . Hyperlipidemia   . Liver fibrosis 02/17/2016   F2/3     Review of Systems  Respiratory: Negative for shortness of breath.   Cardiovascular: Negative for chest pain.     Physical Exam:  Vitals:   09/06/18 1113  BP: 120/85  Pulse: 80  Temp: 97.9 F (36.6 C)  TempSrc: Oral  SpO2: 100%  Weight: 169 lb 8 oz (76.9 kg)  Height: 4\' 10"  (1.473 m)    Constitutional: NAD, appears comfortable Cardiovascular: RRR, no murmurs, rubs, or gallops.  Pulmonary/Chest: CTAB, no wheezes, rales, or rhonchi.  Extremities: Warm and well perfused. Distal pulses intact. No edema.  Psychiatric: Normal mood and affect  Assessment & Plan:   See Encounters Tab for problem based charting.  Patient discussed with Dr. Daryll Drown

## 2018-09-06 NOTE — Patient Instructions (Signed)
Your plan to help your blood sugar and your weight is to drink fluids with less sugar as we discussed.  Your blood sugar are well controlled (except for last Thursday ad Friday)  Follow up every 6- 12 months.   Butch Penny [phone]

## 2018-09-07 NOTE — Progress Notes (Signed)
Internal Medicine Clinic Attending  Case discussed with Dr. Guilloud at the time of the visit.  We reviewed the resident's history and exam and pertinent patient test results.  I agree with the assessment, diagnosis, and plan of care documented in the resident's note.  

## 2018-09-12 DIAGNOSIS — E113213 Type 2 diabetes mellitus with mild nonproliferative diabetic retinopathy with macular edema, bilateral: Secondary | ICD-10-CM | POA: Diagnosis not present

## 2018-09-14 ENCOUNTER — Ambulatory Visit (HOSPITAL_BASED_OUTPATIENT_CLINIC_OR_DEPARTMENT_OTHER): Payer: Medicare Other | Attending: Internal Medicine | Admitting: Internal Medicine

## 2018-09-14 VITALS — Ht <= 58 in | Wt 167.0 lb

## 2018-09-14 DIAGNOSIS — E119 Type 2 diabetes mellitus without complications: Secondary | ICD-10-CM | POA: Insufficient documentation

## 2018-09-14 DIAGNOSIS — I493 Ventricular premature depolarization: Secondary | ICD-10-CM | POA: Insufficient documentation

## 2018-09-14 DIAGNOSIS — I491 Atrial premature depolarization: Secondary | ICD-10-CM | POA: Diagnosis not present

## 2018-09-14 DIAGNOSIS — I10 Essential (primary) hypertension: Secondary | ICD-10-CM | POA: Insufficient documentation

## 2018-09-14 DIAGNOSIS — G4733 Obstructive sleep apnea (adult) (pediatric): Secondary | ICD-10-CM | POA: Diagnosis not present

## 2018-09-14 DIAGNOSIS — F329 Major depressive disorder, single episode, unspecified: Secondary | ICD-10-CM | POA: Insufficient documentation

## 2018-09-14 DIAGNOSIS — R5383 Other fatigue: Secondary | ICD-10-CM | POA: Diagnosis not present

## 2018-09-24 DIAGNOSIS — R5383 Other fatigue: Secondary | ICD-10-CM | POA: Diagnosis not present

## 2018-09-24 DIAGNOSIS — F322 Major depressive disorder, single episode, severe without psychotic features: Secondary | ICD-10-CM

## 2018-09-24 DIAGNOSIS — R0683 Snoring: Secondary | ICD-10-CM | POA: Diagnosis not present

## 2018-09-24 DIAGNOSIS — R400 Somnolence: Secondary | ICD-10-CM

## 2018-09-24 NOTE — Procedures (Signed)
   Patient Name: Nancy [name], Nancy [name] Date: 09/14/2018 Gender: Female D.O.B: 06/01/1956 Age (years): 61 Referring Provider: Gilles Chiquito Height (inches): 36 Interpreting Physician: Baird Lyons MD, ABSM Weight (lbs): 167 RPSGT: Carolin Coy BMI: 35 MRN: [id number] Neck Size: 13.00  CLINICAL INFORMATION Sleep Study Type: NPSG Indication for sleep study: Daytime Fatigue, Depression, Diabetes, Fatigue, Hypertension, Snoring Epworth Sleepiness Score: 8  SLEEP STUDY TECHNIQUE As per the AASM Manual for the Scoring of Sleep and Associated Events v2.3 (April 2016) with a hypopnea requiring 4% desaturations.  The channels recorded and monitored were frontal, central and occipital EEG, electrooculogram (EOG), submentalis EMG (chin), nasal and oral airflow, thoracic and abdominal wall motion, anterior tibialis EMG, snore microphone, electrocardiogram, and pulse oximetry.  MEDICATIONS Medications self-administered by patient taken the night of the study : none reported  SLEEP ARCHITECTURE The study was initiated at 10:49:56 PM and ended at 5:02:22 AM.  Sleep onset time was 23.4 minutes and the sleep efficiency was 76.8%%. The total sleep time was 286 minutes.  Stage REM latency was 132.0 minutes.  The patient spent 16.8%% of the night in stage N1 sleep, 62.1%% in stage N2 sleep, 0.0%% in stage N3 and 21.2% in REM.  Alpha intrusion was absent.  Supine sleep was 54.93%.  RESPIRATORY PARAMETERS The overall apnea/hypopnea index (AHI) was 5.2 per hour. There were 2 total apneas, including 1 obstructive, 1 central and 0 mixed apneas. There were 23 hypopneas and 21 RERAs.  The AHI during Stage REM sleep was 6.0 per hour.  AHI while supine was 6.5 per hour.  The mean oxygen saturation was 93.6%. The minimum SpO2 during sleep was 88.0%.  moderate snoring was noted during this study.  CARDIAC DATA The 2 lead EKG demonstrated sinus rhythm. The mean heart rate was 69.5 beats per  minute. Other EKG findings include: PVCs.  LEG MOVEMENT DATA The total PLMS were 0 with a resulting PLMS index of 0.0. Associated arousal with leg movement index was 0.0 .  IMPRESSIONS - Minimal obstructive sleep apnea occurred during this study (AHI = 5.2/h). - No significant central sleep apnea occurred during this study (CAI = 0.2/h). - The patient had mild desaturation during the study (Min O2 = 88.0%), Mean 93.6%. - The patient snored with moderate snoring volume. - EKG findings include PVCs, PACs. - Clinically significant periodic limb movements did not occur during sleep. No significant associated arousals.  DIAGNOSIS - Obstructive Sleep Apnea (327.23 [G47.33 ICD-10]) - Primary snoring  RECOMMENDATIONS - Very mild obstructive sleep apnea may not require intervention. Conservative measures might include, observation, weight loss, sleep position off back. Other options, including CPAP or a fitted oral appliance, would be based on clinical judgment. - Be careful with alcohol, sedatives and other CNS depressants that may worsen sleep apnea and disrupt normal sleep architecture. - Sleep hygiene should be reviewed to assess factors that may improve sleep quality. - Weight management and regular exercise should be initiated or continued if appropriate.  [Electronically signed] 09/24/2018 11:43 AM  Baird Lyons MD, ABSM Diplomate, American Board of Sleep Medicine   NPI: [id number]                          Summer Shade, [facility]  ELECTRONICALLY SIGNED ON:  09/24/2018, 11:39 AM [place] PH: [phone]   FX: [phone] [place]

## 2018-10-11 ENCOUNTER — Encounter: Payer: Self-pay | Admitting: Internal Medicine

## 2018-10-11 ENCOUNTER — Ambulatory Visit (INDEPENDENT_AMBULATORY_CARE_PROVIDER_SITE_OTHER): Payer: Medicare Other | Admitting: Internal Medicine

## 2018-10-11 ENCOUNTER — Other Ambulatory Visit: Payer: Self-pay

## 2018-10-11 VITALS — BP 128/75 | HR 75 | Temp 97.9°F | Ht <= 58 in | Wt 173.3 lb

## 2018-10-11 DIAGNOSIS — E11649 Type 2 diabetes mellitus with hypoglycemia without coma: Secondary | ICD-10-CM

## 2018-10-11 DIAGNOSIS — Z760 Encounter for issue of repeat prescription: Secondary | ICD-10-CM

## 2018-10-11 DIAGNOSIS — Z794 Long term (current) use of insulin: Secondary | ICD-10-CM | POA: Diagnosis not present

## 2018-10-11 DIAGNOSIS — E11319 Type 2 diabetes mellitus with unspecified diabetic retinopathy without macular edema: Secondary | ICD-10-CM | POA: Diagnosis not present

## 2018-10-11 DIAGNOSIS — R011 Cardiac murmur, unspecified: Secondary | ICD-10-CM

## 2018-10-11 LAB — POCT GLYCOSYLATED HEMOGLOBIN (HGB A1C): Hemoglobin A1C: 6.4 % — AB (ref 4.0–5.6)

## 2018-10-11 LAB — GLUCOSE, CAPILLARY
GLUCOSE-CAPILLARY: 63 mg/dL — AB (ref 70–99)
GLUCOSE-CAPILLARY: 64 mg/dL — AB (ref 70–99)
Glucose-Capillary: 107 mg/dL — ABNORMAL HIGH (ref 70–99)

## 2018-10-11 MED ORDER — ACCU-CHEK FASTCLIX LANCETS MISC: 3 refills | Status: DC

## 2018-10-11 MED ORDER — GLUCOSE BLOOD VI STRP: ORAL_STRIP | 3 refills | Status: DC

## 2018-10-11 MED ORDER — ACCU-CHEK GUIDE W/DEVICE KIT: 1.0000 | PACK | Freq: Three times a day (TID) | 0 refills | Status: DC

## 2018-10-11 NOTE — Patient Instructions (Addendum)
Thank you for coming to the clinic today. It was a pleasure to see you.   For your diabetes, do not take your diabetes medications on days when you wake up and know that you dont have an appetite   We need to have you on a more regular diet in order to get the best control of your diabetes, please make an appointment with donna so that we can help you achieve this goal and help you achieve your goal of weight loss   FOLLOW-UP INSTRUCTIONS When: 3 months with Dr. Hetty Ely   For: Follow up of your diabetes  What to bring: all of your medication bottles AND BLOOD GLUCOSE METER   Please call the internal medicine center clinic if you have any questions or concerns, we may be able to help and keep you from a long and expensive emergency room wait. Our clinic and after hours phone number is [phone], the best time to call is Monday through Friday 9 am to 4 pm but there is always someone available 24/7 if you have an emergency. If you need medication refills please notify your pharmacy one week in advance and they will send [place] a request.

## 2018-10-11 NOTE — Progress Notes (Signed)
Hypoglycemic Event  CBG: 64  Treatment: 3 Glucose tabs  Symptoms:no symptoms   Follow-up CBG: 63 Time:15:09 CBG-107   Result done at 15:46  Possible Reasons for Event: Did not eat after taking meds for Diabetes   Comments/MD notified:Yes  Dr Hetty Ely  Given Glucose tabs x 2 ,soda , and hard candy.  [place] Doig, [employer]

## 2018-10-11 NOTE — Telephone Encounter (Signed)
Requesting a new meter and test strip to be filled @  [facility] [address], [place] [phone] (Phone) [phone] (Fax)

## 2018-10-11 NOTE — Telephone Encounter (Signed)
Meter and supplies entered.

## 2018-10-11 NOTE — Assessment & Plan Note (Signed)
Last A1c 7.5, today her A1c is improved to 6.4, no meter available today because the lancet is broken and she has not been able to get many readings. She is in asymptomatic hypoglycemia upon arrival to the clinic. She notes that she has been over eating a lot over the past few weeks, describes eating drive through and all you can eat buffets, and when she realized that her weight had increased she decided it was time to lose weight and she just had some pecans today. She took her insulin at about 8:30 am which is approximately 6 hours ago, the lantus is probably reaching its peak effect at this time. We discussed the importance of not taking insulin if she realizes that she has a reduced appetite and I encouraged Nancy [name] to allow [place] to be a part of her weight loss strategies. She is open to meeting with donna and voiced that she understands the risk of crash dieting while on insulin therapy.  - Recommended follow up with in office dietitian for general education on what is a healthy weight and how to achieve weight loss through a healthy approach, also to reinforce prior patient education on the importance of maintaining consistencies in her diet while on insulin therapy - continue current medications liraglutide 1.2 mg daily and lantus 32 units daily

## 2018-10-11 NOTE — Progress Notes (Signed)
   CC: follow up of diabetes   HPI:  Nancy [name] is a 62 y.o. with PMH as listed below who presents for follow up of diabetes. Please see the assessment and plans for the status of the patient chronic medical problems.   Past Medical History:  Diagnosis Date  . Carpal tunnel syndrome, bilateral    confirmed on nerve conduction studies  . Chronic hepatitis C ([place])    considered cured 06/2016 post retroviral therapy   . Diabetes mellitus type 2, uncontrolled ([place])   . Diabetic peripheral neuropathy ([place])   . GERD (gastroesophageal reflux disease)   . History of endometriosis   . History of Helicobacter infection 11/07  . Hyperlipidemia   . Liver fibrosis 02/17/2016   F2/3    Review of Systems:  Refer to history of present illness and assessment and plans for pertinent review of systems, all others reviewed and negative  Physical Exam:  Vitals:   10/11/18 1444  BP: 128/75  Pulse: 75  Temp: 97.9 F (36.6 C)  TempSrc: Oral  SpO2: 100%  Weight: 173 lb 4.8 oz (78.6 kg)  Height: 4\' 10"  (1.473 m)   General: well appearing, no acute distress  Cardiac: grade 1/6 systolic murmur loudest over the right upper sternal border, trace pitting edema in bilateral lower extremities  Pulm: normal work of breathing, lungs clear to auscultation  Skin: no ulcers or skin changes on the feet or between the toes   Assessment & Plan:   Type 2 diabetes  Last A1c 7.5, today her A1c is improved to 6.4, no meter available today because the lancet is broken and she has not been able to get many readings. She is in asymptomatic hypoglycemia upon arrival to the clinic. She notes that she has been over eating a lot over the past few weeks, describes eating drive through and all you can eat buffets, and when she realized that her weight had increased she decided it was time to lose weight and she just had some pecans today. She took her insulin at about 8:30 am which is approximately 6 hours ago, the  lantus is probably reaching its peak effect at this time. We discussed the importance of not taking insulin if she realizes that she has a reduced appetite and I encouraged Nancy [name] to allow [place] to be a part of her weight loss strategies. She is open to meeting with donna and voiced that she understands the risk of crash dieting while on insulin therapy.  - Recommended follow up with in office dietitian for general education on what is a healthy weight and how to achieve weight loss through a healthy approach, also to reinforce prior patient education on the importance of maintaining consistencies in her diet while on insulin therapy - continue current medications liraglutide 1.2 mg daily and lantus 32 units daily   See Encounters Tab for problem based charting.  Patient discussed with Dr. Eppie Gibson

## 2018-10-14 NOTE — Progress Notes (Signed)
Case discussed with Dr. Blum at the time of the visit.  We reviewed the resident's history and exam and pertinent patient test results.  I agree with the assessment, diagnosis and plan of care documented in the resident's note. 

## 2018-10-17 ENCOUNTER — Telehealth: Payer: Self-pay | Admitting: Dietician

## 2018-10-18 MED ORDER — ATORVASTATIN CALCIUM 40 MG PO TABS: 40.0000 mg | ORAL_TABLET | Freq: Every day | ORAL | 3 refills | Status: DC

## 2018-10-18 NOTE — Addendum Note (Signed)
Addended by: Meryl Dare on: 10/18/2018 11:28 AM   Modules accepted: Orders

## 2018-10-18 NOTE — Telephone Encounter (Signed)
Called Nancy [name] about a new meter and an appointment. She got a new meter yesterday. She agreed to bring it to her appointment on 11/07/18.

## 2018-10-30 ENCOUNTER — Other Ambulatory Visit: Payer: Self-pay | Admitting: Gastroenterology

## 2018-11-07 ENCOUNTER — Ambulatory Visit (INDEPENDENT_AMBULATORY_CARE_PROVIDER_SITE_OTHER): Payer: Medicare Other | Admitting: Dietician

## 2018-11-07 ENCOUNTER — Encounter: Payer: Self-pay | Admitting: Dietician

## 2018-11-07 DIAGNOSIS — Z794 Long term (current) use of insulin: Secondary | ICD-10-CM

## 2018-11-07 DIAGNOSIS — E11319 Type 2 diabetes mellitus with unspecified diabetic retinopathy without macular edema: Secondary | ICD-10-CM | POA: Diagnosis not present

## 2018-11-07 DIAGNOSIS — Z713 Dietary counseling and surveillance: Secondary | ICD-10-CM | POA: Diagnosis not present

## 2018-11-07 NOTE — Patient Instructions (Signed)
Think about ways to prevent you from having low blood sugars...  And also highs because high blood sugar is why your lantus dose is where it is...  Butch Penny [phone]

## 2018-11-07 NOTE — Progress Notes (Signed)
Medical Nutrition Therapy:  Appt start time: 1320 end time:  7290. Visit # 3  Assessment:  Nancy [name] primary concerns today are glycemic And weight control. Nancy [name] presented with her mother based on a referral from Dr. Hetty Ely related to concerns of hypoglycemia when patient misses meals. Excessive carbohydrate intake still remains a concern  as well based off of Nancy [name]'s 24 hr food recall. She states that she is confused as to how to maintain blood glucose levels within range.    ANTHROPOMETRICS: Patient did not want to be weighed today.  MEDICATIONS: lantus 32 units and victoza 1.2 units daily in the morning.  BLOOD SUGAR: Report summary is from last 30 days,  Average tests per day: 1.1 Average blood glucose: 131 Range: minimum: 86 and maximum: 195 % in target range: 97 % below target range: 0 % above target range: 3 % hypoglycemia: 0 Notes about patterns: primarily checks once/day between 8-12am   Progress Towards Goal(s):  Some progress.   Nutritional Diagnosis:  NI-5.8.2 Excessive carbohydrate intake As related to spikes in blood sugar > 180 mg/dl after meals As evidenced by  the cgm download. Unable to assess due to lack of post-meal blood sugar checks, therefore, will delete this diagnosis.     Nutritional Diagnosis:  NI-5.8.4 Inconsistent carbohydrate intake As related to skipped meals and excessive carbohydrate intake at times  As evidenced by her self-reported 24 hour food recall.    Intervention:  Nutrition counseling to assist patient to explore ambivalence related food intake, blood sugar and weight goals.    Teaching Method Utilized: Visual,Auditory,Hands on Handouts given during visit include: AVS, meter download  Barriers to learning/adherence to lifestyle change: competing values Demonstrated degree of understanding via: her response "it is so confusing"   Monitoring/Evaluation: Dietary intake, exercise, meter, and body weight in 1 month(s) to decide if  another wearing of the Continuous glucose monitor would be helpful, and seek resolution of her ambivalence.   Debera Lat, RD 11/07/2018 2:22 PM.

## 2018-12-08 ENCOUNTER — Encounter: Payer: Self-pay | Admitting: Dietician

## 2018-12-08 ENCOUNTER — Encounter (INDEPENDENT_AMBULATORY_CARE_PROVIDER_SITE_OTHER): Payer: Self-pay

## 2018-12-08 ENCOUNTER — Other Ambulatory Visit: Payer: Self-pay | Admitting: Dietician

## 2018-12-08 ENCOUNTER — Ambulatory Visit (INDEPENDENT_AMBULATORY_CARE_PROVIDER_SITE_OTHER): Payer: Medicare Other | Admitting: Dietician

## 2018-12-08 DIAGNOSIS — N182 Chronic kidney disease, stage 2 (mild): Secondary | ICD-10-CM

## 2018-12-08 DIAGNOSIS — E1122 Type 2 diabetes mellitus with diabetic chronic kidney disease: Secondary | ICD-10-CM

## 2018-12-08 DIAGNOSIS — Z794 Long term (current) use of insulin: Principal | ICD-10-CM

## 2018-12-08 DIAGNOSIS — E11319 Type 2 diabetes mellitus with unspecified diabetic retinopathy without macular edema: Secondary | ICD-10-CM

## 2018-12-08 DIAGNOSIS — Z713 Dietary counseling and surveillance: Secondary | ICD-10-CM

## 2018-12-08 NOTE — Progress Notes (Signed)
Medical Nutrition Therapy referral request for 2020

## 2018-12-08 NOTE — Progress Notes (Signed)
Medical Nutrition Therapy:  Appt start time: 1320 end time:  1779. Visit # 4  Assessment:  [name] primary concerns today are glycemic and constipation. [name] presented with her mother for follow up for carb consistent diet. Periodic excessive carbohydrate intake still remains a concern according to [name]'s 24 hr food recall. She was not aware of how many carbs some foods contain. After education she stated her personal goal for carb intake is 40-50 grams per meal.  She and her mother also state she is concerned about constipation. She had a small BM yesterday and has had abdominal cramping.   ANTHROPOMETRICS: Patient did not want to be weighed today.  BLOOD SUGAR:she did not bring her meter today. She checks one time- fasting and reports the last few days readings from memory:  142/123. "2 highs in 200s",231, 241 over the past month and the Lowest was 95, no symptoms of low blood sugar or high  Progress Towards Goal(s):  Some progress.    Nutritional Diagnosis:  NI-5.8.4 Inconsistent carbohydrate intake As related to skipped meals and excessive carbohydrate intake at times  As evidenced by her self-reported 24 hour food recall.    Intervention:  Nutrition counseling to assist patient to set goals to help with carb consistency.  Gave her an activity to help her become more aware of carbs in the food she eats. She wrap up for recs for constipation Action Goal: keep records of 4 meals and carb content over the next 4 weeks Outcome goal: an increase in blood sugar in target range Coordination of care- recommend CGM in February.   Teaching Method Utilized: Visual,Auditory,Hands on Handouts given during visit include: AVS, meter download  Barriers to learning/adherence to lifestyle change: competing values Demonstrated degree of understanding via: teachback  Monitoring/Evaluation: Dietary intake, exercise, meter, and body weight in 1 month(s)consider repeat CGM Nancy [name],  RD 12/08/2018 3:00 PM.

## 2018-12-08 NOTE — Patient Instructions (Addendum)
For your diabetes- Your goal is to eat about 40-50 grams carb at each meal 1- write down what you eat for one meal each week for the next 4-5 weeks. 2- go back and add up how many carbs were in that meal on the paper provided 3- IF YOU WANT To- you can check your blood sugar 12 Hours after a meal with goal of being less than 180.     For constipation:   Mix Miralax in 8 oz water and drink one for the next 3-7 days. After you have had a complete bowel movements then do the following  o Drink 64 fl oz or more of water and other low-calorie (10 calories or less per 8 fl oz) beverages  o You can add 1-3 teaspoons per day of Metamucil(Psyllium hisk, 1 teaspoon at a time, to any fluids you choose  o Add chia seeds or freshly ground flax seeds to yogurt or oatmeal  o Eat some prunes, can have a little prune juice  o Eat as much leafy green vegetables as possible (spinach, kale, collards, turnip and mustard greens); they provided a lot of fiber, and are a good source of magnesium as well. Add kale or spinach to smoothies with frozen berries (more fiber) if you do not like to eat greens.  o Other magnesium-rich foods are: legumes (beans and lentils - also great sources of fiber), nuts like almonds and cashews, avocado, yogurt, and milk.  o Over time try to increase intake of vegetables and fruits to 1 or more cups each per day. This will probably take 6+ months.

## 2018-12-15 ENCOUNTER — Other Ambulatory Visit: Payer: Self-pay | Admitting: Dietician

## 2018-12-15 DIAGNOSIS — Z794 Long term (current) use of insulin: Principal | ICD-10-CM

## 2018-12-15 DIAGNOSIS — E11319 Type 2 diabetes mellitus with unspecified diabetic retinopathy without macular edema: Secondary | ICD-10-CM

## 2018-12-15 NOTE — Progress Notes (Signed)
Order for Continuous glucose monitoring per Dr. Hetty Ely

## 2019-01-10 ENCOUNTER — Ambulatory Visit: Payer: Medicare Other | Admitting: Dietician

## 2019-01-17 ENCOUNTER — Encounter: Payer: Medicare Other | Admitting: Internal Medicine

## 2019-01-17 ENCOUNTER — Ambulatory Visit: Payer: Medicare Other | Admitting: Dietician

## 2019-01-24 DIAGNOSIS — E113212 Type 2 diabetes mellitus with mild nonproliferative diabetic retinopathy with macular edema, left eye: Secondary | ICD-10-CM | POA: Diagnosis not present

## 2019-01-24 DIAGNOSIS — E113291 Type 2 diabetes mellitus with mild nonproliferative diabetic retinopathy without macular edema, right eye: Secondary | ICD-10-CM | POA: Diagnosis not present

## 2019-01-24 LAB — HM DIABETES EYE EXAM

## 2019-02-09 ENCOUNTER — Other Ambulatory Visit: Payer: Self-pay | Admitting: Gastroenterology

## 2019-02-10 ENCOUNTER — Encounter: Payer: Self-pay | Admitting: *Deleted

## 2019-02-20 ENCOUNTER — Other Ambulatory Visit: Payer: Self-pay | Admitting: Internal Medicine

## 2019-02-20 ENCOUNTER — Telehealth: Payer: Self-pay

## 2019-02-20 DIAGNOSIS — Z1231 Encounter for screening mammogram for malignant neoplasm of breast: Secondary | ICD-10-CM

## 2019-02-21 NOTE — Telephone Encounter (Signed)
Spoke with patient about appointment cancellations. Encouraged them to contact Butch Penny with concerns or questions about diabetes. Encouraged to contact [facility] office with scheduling concerns.

## 2019-02-28 ENCOUNTER — Ambulatory Visit: Payer: Medicare Other | Admitting: Dietician

## 2019-02-28 ENCOUNTER — Encounter: Payer: Medicare Other | Admitting: Internal Medicine

## 2019-03-28 ENCOUNTER — Ambulatory Visit: Payer: Medicare Other

## 2019-03-30 ENCOUNTER — Ambulatory Visit: Payer: Medicare Other | Admitting: Dietician

## 2019-03-30 ENCOUNTER — Encounter: Payer: Self-pay | Admitting: Dietician

## 2019-03-30 ENCOUNTER — Other Ambulatory Visit: Payer: Self-pay

## 2019-03-30 NOTE — Patient Instructions (Signed)
Carbohydrate Counting for Diabetes Mellitus, Adult  Carbohydrate counting is a method of keeping track of how many carbohydrates you eat. Eating carbohydrates naturally increases the amount of sugar (glucose) in the blood.   Counting how many carbohydrates you eat helps keep your blood glucose within normal limits, which helps you manage your diabetes (diabetes mellitus).   It is important to know how many carbohydrates you can safely have in each meal. This is different for every person.  Carbohydrates are found in the following foods:  Grains, such as breads and cereals.  Dried beans and soy products.  Starchy vegetables, such as potatoes, peas, and corn.  Fruit and fruit juices.  Milk and yogurt.  Sweets and snack foods, such as cake, cookies, candy, chips, and soft drinks.  How do I count carbohydrates? There are two ways to count carbohydrates in food. You can use either of the methods or a combination of both.  1.  Reading "Nutrition Facts" on packaged food The "Nutrition Facts" list is included on the labels of almost all packaged foods and beverages in the [place] It includes:  The serving size.  Information about nutrients in each serving, including the grams (g) of carbohydrate per serving. To use the "Nutrition Facts":  Decide how many servings you will have.  Multiply the number of servings by the number of carbohydrates per serving.  The resulting number is the total amount of carbohydrates that you will be having.  2. Learning standard serving sizes of other foods When you eat carbohydrate foods that are not packaged or do not include "Nutrition Facts" on the label, you need to measure the servings in order to count the amount of carbohydrates:  Measure the foods that you will eat with a food scale or measuring cup, if needed.  Decide how many standard-size servings you will eat.  The following list contains standard serving sizes of common carbohydrate-rich  foods. Each of these servings has about 15 g of carbohydrates:   hamburger bun or  English muffin.   oz (15 mL) syrup.   oz (14 g) jelly.  1 slice of bread.  1 six-inch tortilla.  3 oz (85 g) cooked rice or pasta.  4 oz (113 g) cooked dried beans.  4 oz (113 g) starchy vegetable, such as peas, corn, or potatoes.  4 oz (113 g) hot cereal.  4 oz (113 g) mashed potatoes or  of a large baked potato.  4 oz (113 g) canned or frozen fruit.  4 oz (120 mL) fruit juice.  4-6 crackers.  6 chicken nuggets.  6 oz (170 g) unsweetened dry cereal.  6 oz (170 g) plain fat-free yogurt or yogurt sweetened with artificial sweeteners.  8 oz (240 mL) milk.  8 oz (170 g) fresh fruit or one small piece of fruit.  24 oz (680 g) popped popcorn.  Example of carbohydrate counting Sample meal  3 oz (85 g) chicken breast = ___________ carbohydrates  6 oz (170 g) brown rice = _____________ carbohydrates  4 oz (113 g) corn = _______________ carbohydrates  8 oz (240 mL) milk = _______________ carbohydrates  8 oz (170 g) strawberries with sugar-free whipped topping.= __________________ carbohydrates  Summary  Carbohydrate counting is a method of keeping track of how many carbohydrates you eat.  Eating carbohydrates naturally increases the amount of sugar (glucose) in the blood.  Counting how many carbohydrates you eat helps keep your blood glucose within normal limits, which helps you manage your diabetes.

## 2019-03-30 NOTE — Progress Notes (Signed)
.  Medical Nutrition Therapy:  Appt start time: 1410 end time:  1440 Visit # 5  Assessment:    Ms. Suderman reports she is using the carb consistent meal planning as best she can. "pretty good". She limits her portions and higher sugar and fat foods for the most part because she doesn't want to gain weight while not going outdoors with Covid-19 restrictions. As far as sodas, she does not buy them anymore. She only has them on occasion when out and mostly drinks  Diet Green tea and water. Her constipation is improved with pills and  fiber  ANTHROPOMETRICS: Weight-160s is her estimate  BLOOD SUGAR:90-200 is her goal; doing pretty good, not eating out so not eating as much, a few highs, 135/155/90  ACTIVITY- not doing as much and was not willing to start taking walks  Progress Towards Goal(s):  Some progress.    Nutritional Diagnosis:  NI-5.8.4 Inconsistent carbohydrate intake As related to skipped meals and excessive carbohydrate intake at times is improving As evidenced by her report. Would be ideal to be able to show this on a CGM    Intervention:  Nutrition counseling and support to assist patient to set goals to help with carb consistency, weight neutral meal plan.    Outcome goal: an increase in blood sugar in target range Coordination of care- recommend CGM when able.   Teaching Method Utilized: Auditory Handouts given: AVS  Barriers to learning/adherence to lifestyle change: competing values Demonstrated degree of understanding via: teachback  Monitoring/Evaluation:  in 1 month(s)consider repeat CGM Debera Lat, RD 03/30/2019 2:29 PM.

## 2019-04-10 ENCOUNTER — Other Ambulatory Visit: Payer: Self-pay | Admitting: Internal Medicine

## 2019-04-10 ENCOUNTER — Other Ambulatory Visit: Payer: Self-pay

## 2019-04-10 ENCOUNTER — Ambulatory Visit (INDEPENDENT_AMBULATORY_CARE_PROVIDER_SITE_OTHER): Payer: Medicare Other | Admitting: Internal Medicine

## 2019-04-10 ENCOUNTER — Telehealth: Payer: Self-pay | Admitting: Internal Medicine

## 2019-04-10 ENCOUNTER — Encounter: Payer: Self-pay | Admitting: Internal Medicine

## 2019-04-10 DIAGNOSIS — R12 Heartburn: Secondary | ICD-10-CM

## 2019-04-10 DIAGNOSIS — M62838 Other muscle spasm: Secondary | ICD-10-CM

## 2019-04-10 MED ORDER — ACETAMINOPHEN 500 MG PO CHEW: 500.0000 mg | CHEWABLE_TABLET | Freq: Four times a day (QID) | ORAL | 0 refills | Status: DC | PRN

## 2019-04-10 NOTE — Telephone Encounter (Signed)
Returned call to patient. C/o severe muscle spasms under breast and rib cage, sometimes legs, especially thighs. placed on [facility] visit for telehealth visit today. Hubbard Hartshorn, RN, BSN

## 2019-04-10 NOTE — Assessment & Plan Note (Signed)
Patient reports she has been having muscle spasms under her ribcage and around her breast, she has been having them for years. The spasms are worse with movement. The pain is intermittent. She has been having associated heartburn. She has taken muscles relaxants that she took from a friend in the past and this helped to relieve the pain but she has since run out. She denies associated chest pain, diaphoresis, shortness of breath, or worsening with exertion. She denies associated cough. She is willing to try a course of prescription strength tylenol.  - prescribed tylenol 500 mg q6h  - patient advised to schedule follow up in continuity clinic

## 2019-04-10 NOTE — Telephone Encounter (Signed)
Thank you :)

## 2019-04-10 NOTE — Progress Notes (Signed)
   CC: evaluation of muscle spasms  This is a telephone encounter between [employer] and Nancy [name] on 04/10/2019 for evaluation of muscle spasms. The visit was conducted with the patient located at home and Nancy [name] at [facility]. The patient's identity was confirmed using their DOB and current address. The patient has consented to being evaluated through a telephone encounter and understands the associated risks (an examination cannot be done and the patient may need to come in for an appointment) / benefits (allows the patient to remain at home, decreasing exposure to coronavirus). I personally spent 14 minutes on medical discussion.   HPI:  [name] is a 63 y.o. with PMH as below.   Please see A&P for assessment of the patient's acute and chronic medical conditions.   Past Medical History:  Diagnosis Date  . Carpal tunnel syndrome, bilateral    confirmed on nerve conduction studies  . Chronic hepatitis C ([place])    considered cured 06/2016 post retroviral therapy   . Diabetes mellitus type 2, uncontrolled ([place])   . Diabetic peripheral neuropathy ([place])   . GERD (gastroesophageal reflux disease)   . History of endometriosis   . History of Helicobacter infection 11/07  . Hyperlipidemia   . Liver fibrosis 02/17/2016   F2/3    Review of Systems:  Refer to history of present illness and assessment and plans for pertinent review of systems, all others reviewed and negative  Assessment & Plan:   Muscle spasms  Patient reports she has been having muscle spasms under her ribcage and around her breast, she has been having them for years. The spasms are worse with movement. The pain is intermittent. She has been having associated heartburn. She has taken muscles relaxants that she took from a friend in the past and this helped to relieve the pain but she has since run out. She denies associated chest pain, diaphoresis, shortness of breath, or worsening with exertion. She denies associated  cough. She is willing to try a course of prescription strength tylenol.  - prescribed tylenol 500 mg q6h  - patient advised to schedule follow up in continuity clinic    See Encounters Tab for problem based charting.  Patient discussed with Dr. Dareen Piano

## 2019-04-10 NOTE — Telephone Encounter (Signed)
Pt requesting nurse to callback [phone]

## 2019-04-11 ENCOUNTER — Telehealth: Payer: Self-pay

## 2019-04-11 MED ORDER — ACETAMINOPHEN 500 MG PO TABS: 500.0000 mg | ORAL_TABLET | Freq: Four times a day (QID) | ORAL | 0 refills | Status: DC | PRN

## 2019-04-11 NOTE — Telephone Encounter (Signed)
Return pt's call - stated she had called to pharmacy about new med prescribed yesterday- stated they received a rx for "gummie" ; told her the doctor ordered Tylenol chewable for her mus spasms.   Also stated needs a refill on Amlodipine, not on current med - pt stated she has been taken his med along w/ Spironolactone.   Then she stated having pain under right breast, radiating to mid back, like she pulled a muscle - informed to try Tylenol prescribed yesterday and let us know if this does not help.

## 2019-04-11 NOTE — Telephone Encounter (Signed)
Needs to speak with a nurse about bp med, and rib cage pain . Please call pt back.

## 2019-04-11 NOTE — Telephone Encounter (Signed)
I sent in a corrected prescription for the normal tylenol. That's correct, she was told to stop taking the amlodipine when she developed ankle edema last June. Thank you!

## 2019-04-12 NOTE — Telephone Encounter (Signed)
Called pt - informed Tylenol was changed from chewable to pills and amlodipine was stopped d/t ankle edema per Dr Hetty Ely. Repeated/verbalized understanding;

## 2019-04-12 NOTE — Progress Notes (Signed)
Internal Medicine Clinic Attending  Case discussed with Dr. Blum at the time of the visit.  We reviewed the resident's history and exam and pertinent patient test results.  I agree with the assessment, diagnosis, and plan of care documented in the resident's note. 

## 2019-04-18 ENCOUNTER — Ambulatory Visit (INDEPENDENT_AMBULATORY_CARE_PROVIDER_SITE_OTHER): Payer: Medicare Other | Admitting: Internal Medicine

## 2019-04-18 ENCOUNTER — Other Ambulatory Visit: Payer: Self-pay

## 2019-04-18 DIAGNOSIS — E1169 Type 2 diabetes mellitus with other specified complication: Secondary | ICD-10-CM

## 2019-04-18 DIAGNOSIS — N182 Chronic kidney disease, stage 2 (mild): Secondary | ICD-10-CM

## 2019-04-18 DIAGNOSIS — E11319 Type 2 diabetes mellitus with unspecified diabetic retinopathy without macular edema: Secondary | ICD-10-CM

## 2019-04-18 DIAGNOSIS — E1159 Type 2 diabetes mellitus with other circulatory complications: Secondary | ICD-10-CM

## 2019-04-18 DIAGNOSIS — E785 Hyperlipidemia, unspecified: Secondary | ICD-10-CM

## 2019-04-18 DIAGNOSIS — I10 Essential (primary) hypertension: Secondary | ICD-10-CM

## 2019-04-18 DIAGNOSIS — E1122 Type 2 diabetes mellitus with diabetic chronic kidney disease: Secondary | ICD-10-CM

## 2019-04-18 DIAGNOSIS — Z794 Long term (current) use of insulin: Secondary | ICD-10-CM | POA: Diagnosis not present

## 2019-04-18 DIAGNOSIS — N183 Chronic kidney disease, stage 3 (moderate): Secondary | ICD-10-CM

## 2019-04-18 DIAGNOSIS — I152 Hypertension secondary to endocrine disorders: Secondary | ICD-10-CM

## 2019-04-18 NOTE — Progress Notes (Signed)
CC: follow up of diabetes   This is a telephone encounter between [employer] and Ledell Noss on 04/18/2019 for follow up of diabetes. The visit was conducted with the patient located at home and Ledell Noss at [facility]. The patient's identity was confirmed using their DOB and current address. The patient has consented to being evaluated through a telephone encounter and understands the associated risks (an examination cannot be done and the patient may need to come in for an appointment) / benefits (allows the patient to remain at home, decreasing exposure to coronavirus). I personally spent 21 minutes on medical discussion.   HPI:  [name] is a 63 y.o. with PMH HTN, T2DMwith peripheral neuropathy, severe obesity, depression, CKD 2, GERD, andtreatedhepatitis Cwith hepatic fibrosiswho presents for follow up of continuous blood glucose monitor  Please see A&P for assessment of the patient's acute and chronic medical conditions.   Past Medical History:  Diagnosis Date  . Carpal tunnel syndrome, bilateral    confirmed on nerve conduction studies  . Chronic hepatitis C ([place])    considered cured 06/2016 post retroviral therapy   . Diabetes mellitus type 2, uncontrolled ([place])   . Diabetic peripheral neuropathy ([place])   . GERD (gastroesophageal reflux disease)   . History of endometriosis   . History of Helicobacter infection 11/07  . Hyperlipidemia   . Liver fibrosis 02/17/2016   F2/3    Review of Systems:  Refer to history of present illness and assessment and plans for pertinent review of systems, all others reviewed and negative  Assessment & Plan:   Diabetes Mellitus with peripheral neuropathy and CKD  Diabetes is controlled, last hemoglobin A1c was 6.4. Patient reports that she just got a new meter. Meter check this morning was 135 and this is consistent with where it usually is, only occasionally has it been 180. She denies any blood glucose <80 or  Symptoms of hypoglycemia.  History of vastly fluctuating diabetes control and hypoglycemia due to binge eating and fasting in attempt to correct her A1c. There was concern at our last office visit for asymptomatic hypoglycemia however she had a CBM placed which only showed one episode of hypoglycemia over three days. She reports good compliance with current medications.  - recommended returning to visit for a lab only visit to follow up hemoglobin A1c - continue counseling with in office dietitian  - liraglutide 1.2 mg qd and lantus 32 units daily  - continue olmesartan for proteinuria   CKD 3  Patient has had stable CKD over the past year. This is likely a complication of her diabetes and hypertension. She denies any symptoms concerning for uremia or electrolyte abnormalities. She has not yet established with nephrology, I think now would be a good time for her to meet with them.  - follow up BMP, phosphate, PTH, and 1,25 hydroxyvitamin D ( calcitriol)  - continue blood pressure and diabetes management, avoid nephrotoxic medicaitons - referral to nephrology   Hypertension  Patient has consistently well controlled blood pressure on prior checks. She does not have a blood pressure cuff at home but is working on getting a new one. Experienced lower extremity swelling when she was on amlodipine in the past.  - continue olmesartan 40 mg daily and spironolactone 50 mg daily   Hyperlipidemia  Patient continues to take atorvastatin, she denies any side effects related to this medication.  - continue atorvastatin 40 mg daily   See Encounters Tab for problem based charting.  Patient  discussed with Dr. Daryll Drown

## 2019-04-20 ENCOUNTER — Encounter: Payer: Self-pay | Admitting: Internal Medicine

## 2019-04-20 DIAGNOSIS — E1169 Type 2 diabetes mellitus with other specified complication: Secondary | ICD-10-CM | POA: Insufficient documentation

## 2019-04-20 NOTE — Assessment & Plan Note (Signed)
Diabetes is controlled, last hemoglobin A1c was 6.4. Patient reports that she just got a new meter. Meter check this morning was 135 and this is consistent with where it usually is, only occasionally has it been 180. She denies any blood glucose <80 or  Symptoms of hypoglycemia. History of vastly fluctuating diabetes control and hypoglycemia due to binge eating and fasting in attempt to correct her A1c. There was concern at our last office visit for asymptomatic hypoglycemia however she had a CBM placed which only showed one episode of hypoglycemia over three days. She reports good compliance with current medications.  - recommended returning to visit for a lab only visit to follow up hemoglobin A1c - continue counseling with in office dietitian  - liraglutide 1.2 mg qd and lantus 32 units daily  - continue olmesartan for proteinuria

## 2019-04-20 NOTE — Assessment & Plan Note (Addendum)
Patient has had stable CKD over the past year. This is likely a complication of her diabetes and hypertension. She denies any symptoms concerning for uremia or electrolyte abnormalities. She has not yet established with nephrology, I think now would be a good time for her to meet with them.  - follow up BMP, phosphate, PTH, and 1,25 hydroxyvitamin D ( calcitriol)  - continue blood pressure and diabetes management, avoid nephrotoxic medicaitons - referral to nephrology

## 2019-04-20 NOTE — Assessment & Plan Note (Signed)
Patient has consistently well controlled blood pressure on prior checks. She does not have a blood pressure cuff at home but is working on getting a new one. Experienced lower extremity swelling when she was on amlodipine in the past.  - continue olmesartan 40 mg daily and spironolactone 50 mg daily

## 2019-04-20 NOTE — Assessment & Plan Note (Signed)
Patient continues to take atorvastatin, she denies any side effects related to this medication.  - continue atorvastatin 40 mg daily

## 2019-04-21 NOTE — Progress Notes (Signed)
Internal Medicine Clinic Attending  Case discussed with Dr. Hetty Ely soon after the resident saw the patient.  We reviewed the resident's history, telephone conversation and pertinent patient test results.  I agree with the assessment, diagnosis, and plan of care documented in the resident's note.

## 2019-04-24 ENCOUNTER — Other Ambulatory Visit: Payer: Self-pay | Admitting: *Deleted

## 2019-04-24 DIAGNOSIS — E1122 Type 2 diabetes mellitus with diabetic chronic kidney disease: Secondary | ICD-10-CM

## 2019-04-24 DIAGNOSIS — E11319 Type 2 diabetes mellitus with unspecified diabetic retinopathy without macular edema: Secondary | ICD-10-CM

## 2019-04-24 DIAGNOSIS — N182 Chronic kidney disease, stage 2 (mild): Secondary | ICD-10-CM

## 2019-04-24 NOTE — Addendum Note (Signed)
Addended by: Orson Gear on: 04/24/2019 03:20 PM   Modules accepted: Orders

## 2019-05-03 ENCOUNTER — Ambulatory Visit: Payer: Medicare Other | Admitting: Dietician

## 2019-05-05 DIAGNOSIS — E1122 Type 2 diabetes mellitus with diabetic chronic kidney disease: Secondary | ICD-10-CM | POA: Diagnosis not present

## 2019-05-05 DIAGNOSIS — Z8619 Personal history of other infectious and parasitic diseases: Secondary | ICD-10-CM | POA: Diagnosis not present

## 2019-05-05 DIAGNOSIS — N183 Chronic kidney disease, stage 3 (moderate): Secondary | ICD-10-CM | POA: Diagnosis not present

## 2019-05-05 DIAGNOSIS — R801 Persistent proteinuria, unspecified: Secondary | ICD-10-CM | POA: Diagnosis not present

## 2019-05-05 DIAGNOSIS — I129 Hypertensive chronic kidney disease with stage 1 through stage 4 chronic kidney disease, or unspecified chronic kidney disease: Secondary | ICD-10-CM | POA: Diagnosis not present

## 2019-05-10 ENCOUNTER — Ambulatory Visit: Payer: Medicare Other

## 2019-05-11 ENCOUNTER — Ambulatory Visit (INDEPENDENT_AMBULATORY_CARE_PROVIDER_SITE_OTHER): Payer: Medicare Other | Admitting: Dietician

## 2019-05-11 ENCOUNTER — Encounter: Payer: Self-pay | Admitting: Dietician

## 2019-05-11 ENCOUNTER — Other Ambulatory Visit: Payer: Self-pay

## 2019-05-11 DIAGNOSIS — Z713 Dietary counseling and surveillance: Secondary | ICD-10-CM | POA: Diagnosis not present

## 2019-05-11 DIAGNOSIS — Z794 Long term (current) use of insulin: Secondary | ICD-10-CM | POA: Diagnosis not present

## 2019-05-11 DIAGNOSIS — E11319 Type 2 diabetes mellitus with unspecified diabetic retinopathy without macular edema: Secondary | ICD-10-CM | POA: Diagnosis not present

## 2019-05-11 NOTE — Progress Notes (Addendum)
.  Medical Nutrition Therapy:  Appt start time: 2376 end time:1128 Visit # 5 Telephone visit due to COVID-19.  This is a telephone encounter between@ and Lutie Pickler on 05/11/2019 for Medical Nutrition therapy. The visit was conducted with the patient located at home and Butch Penny  at [facility]. The patient's identity was confirmed using their DOB and current address. The patient has consented to being evaluated through a telephone encounter and understands the associated risks / benefits (allows the patient to remain at home, decreasing exposure to coronavirus). I personally spent  13 minutes on medical nutrition therapy discussion.   The following statements were read to the patient and/or legal guardian that are established with the Registered dietitian nutrtionist Provider.   "The purpose of this phone visit is to provide behavioral health care while limiting exposure to the coronavirus (COVID19).  There is a possibility of technology failure and discussed alternative modes of communication if that failure occurs."   "By engaging in this telephone visit, you consent to the provision of healthcare.  Additionally, you authorize for your insurance to be billed for the services provided during this telephone visit."    Patient and/or legal guardian consented to telephone visit: yes   Assessment:    Ms. Mcglown reports she is using the carb consistent meal planning  "pretty good". Eats fruit 12-1, 3-4 PM shack then a meal 6-8 Pm,   Wants to get tested for coronavirus. No symptoms. Willing to come to office. thinking about CGM  She saw kidney doctor and wa todl kidneys have improved. She is unhappy that she has gained 10-11 # so is trying to limit her portions even more now.  Does not drink anything except water. Likes salt-admits she eats too much and eats out every day. Has constipation - says it is "not bad", stopped fiber  BLOOD SUGAR:135 this am, highest 200. Lowest 125, checks in am and in  pm  ACTIVITY- going out daily, but no formal exercise  Progress Towards Goal(s):  Some progress.    Nutritional Diagnosis:  NI-5.8.4 Inconsistent carbohydrate intake As related to skipped meals and excessive carbohydrate intake at times is improving As evidenced by her report. Would be ideal to be able to show this on a CGM    Intervention:  Nutrition counseling and support to assist patient to set goals to help with carb consistency, weight neutral, lower salt an higher nutrient quality meal plan.    Outcome goal: an increase in blood sugar in target range Coordination of care- recommend CGM when able.   Teaching Method Utilized: Auditory Handouts given: AVS  Barriers to learning/adherence to lifestyle change: competing values Demonstrated degree of understanding via: teachback  Monitoring/Evaluation:  in 1 month(s)consider repeat CGM as able.  Debera Lat, RD 05/11/2019 11:45 AM.  Addendum- informed patient of covid-19 testing sites. We agreed that I would mail her the information.

## 2019-05-11 NOTE — Patient Instructions (Signed)
DASH Eating Plan DASH stands for "Dietary Approaches to Stop Hypertension." The DASH eating plan is a healthy eating plan that has been shown to reduce high blood pressure (hypertension). It may also reduce your risk for type 2 diabetes, heart disease, and stroke. The DASH eating plan may also help with weight loss. What are tips for following this plan?  General guidelines  Avoid eating more than 2,300 mg (milligrams) of salt (sodium) a day. If you have hypertension, you may need to reduce your sodium intake to 1,500 mg a day.   Get at least 30 minutes of exercise that causes your heart to beat faster (aerobic exercise) most days of the week. Activities may include walking, swimming, or biking.  Reading food labels   Check food labels for the amount of sodium per serving. Choose foods with less than 5 percent of the Daily Value of sodium. Generally, foods with less than 300 mg of sodium per serving fit into this eating plan.  To find whole grains, look for the word "whole" as the first word in the ingredient list. Shopping  Buy products labeled as "low-sodium" or "no salt added."  Buy fresh foods. Avoid canned foods and premade or frozen meals. Cooking  Avoid adding salt when cooking. Use salt-free seasonings or herbs instead of table salt or sea salt. Check with your health care provider or pharmacist before using salt substitutes.  Do not fry foods. Cook foods using healthy methods such as baking, boiling, grilling, and broiling instead.  Cook with heart-healthy oils, such as olive, canola, soybean, or sunflower oil. Meal planning  Eat a balanced diet that includes: ? 5 or more servings of fruits and vegetables each day. At each meal, try to fill half of your plate with fruits and vegetables. ? Up to 6-8 servings of whole grains each day. ? Less than 6 oz of lean meat, poultry, or fish each day. A 3-oz serving of meat is about the same size as a deck of cards. One egg equals 1  oz. ? 2 servings of low-fat dairy each day. ? A serving of nuts, seeds, or beans 5 times each week. ? Heart-healthy fats. Healthy fats called Omega-3 fatty acids are found in foods such as flaxseeds and coldwater fish, like sardines, salmon, and mackerel.  Limit how much you eat of the following: ? Canned or prepackaged foods. ? Food that is high in trans fat, such as fried foods. ? Food that is high in saturated fat, such as fatty meat. ? Sweets, desserts, sugary drinks, and other foods with added sugar. ? Full-fat dairy products.  Do not salt foods before eating.  Try to eat at least 2 vegetarian meals each week.  Eat more home-cooked food and less restaurant, buffet, and fast food.  When eating at a restaurant, ask that your food be prepared with less salt or no salt, if possible. What foods are recommended? The items listed may not be a complete list. Talk with your dietitian about what dietary choices are best for you. Grains Whole-grain or whole-wheat bread. Whole-grain or whole-wheat pasta. Brown rice. [place]. Bulgur. Whole-grain and low-sodium cereals. Pita bread. Low-fat, low-sodium crackers. Whole-wheat flour tortillas. Vegetables Fresh or frozen vegetables (raw, steamed, roasted, or grilled). Low-sodium or reduced-sodium tomato and vegetable juice. Low-sodium or reduced-sodium tomato sauce and tomato paste. Low-sodium or reduced-sodium canned vegetables. Fruits All fresh, dried, or frozen fruit. Canned fruit in natural juice (without added sugar). Meat and other protein foods Skinless chicken  or [place]. Ground chicken or [place]. Pork with fat trimmed off. Fish and seafood. Egg whites. Dried beans, peas, or lentils. Unsalted nuts, nut butters, and seeds. Unsalted canned beans. Lean cuts of beef with fat trimmed off. Low-sodium, lean deli meat. Dairy Low-fat (1%) or fat-free (skim) milk. Fat-free, low-fat, or reduced-fat cheeses. Nonfat, low-sodium ricotta or cottage  cheese. Low-fat or nonfat yogurt. Low-fat, low-sodium cheese. Fats and oils Soft margarine without trans fats. Vegetable oil. Low-fat, reduced-fat, or light mayonnaise and salad dressings (reduced-sodium). Canola, safflower, olive, soybean, and sunflower oils. Avocado. Seasoning and other foods Herbs. Spices. Seasoning mixes without salt. Unsalted popcorn and pretzels. Fat-free sweets.   Summary  The DASH eating plan is a healthy eating plan that has been shown to reduce high blood pressure (hypertension). It may also reduce your risk for type 2 diabetes, heart disease, and stroke.  With the DASH eating plan, you should limit salt (sodium) intake to 2,300 mg a day. If you have hypertension, you may need to reduce your sodium intake to 1,500 mg a day.  When on the DASH eating plan, aim to eat more fresh fruits and vegetables, whole grains, lean proteins, low-fat dairy, and heart-healthy fats.

## 2019-05-12 ENCOUNTER — Other Ambulatory Visit: Payer: Self-pay | Admitting: Internal Medicine

## 2019-05-12 ENCOUNTER — Other Ambulatory Visit: Payer: Self-pay | Admitting: Gastroenterology

## 2019-05-13 ENCOUNTER — Other Ambulatory Visit: Payer: Self-pay | Admitting: Gastroenterology

## 2019-06-01 ENCOUNTER — Encounter: Payer: Self-pay | Admitting: *Deleted

## 2019-06-02 ENCOUNTER — Other Ambulatory Visit: Payer: Self-pay

## 2019-06-02 ENCOUNTER — Ambulatory Visit (INDEPENDENT_AMBULATORY_CARE_PROVIDER_SITE_OTHER): Payer: Medicare Other | Admitting: Internal Medicine

## 2019-06-02 ENCOUNTER — Encounter: Payer: Self-pay | Admitting: Internal Medicine

## 2019-06-02 DIAGNOSIS — M62838 Other muscle spasm: Secondary | ICD-10-CM

## 2019-06-02 MED ORDER — CYCLOBENZAPRINE HCL 10 MG PO TABS: 10.0000 mg | ORAL_TABLET | Freq: Three times a day (TID) | ORAL | 1 refills | Status: DC | PRN

## 2019-06-02 NOTE — Progress Notes (Signed)
  [facility] Internal Medicine Residency Telephone Encounter Continuity Care Appointment  HPI:   This telephone encounter was created for Nancy [name] on 06/02/2019 for the following purpose/cc muscle spasms.   Past Medical History:  Past Medical History:  Diagnosis Date  . Carpal tunnel syndrome, bilateral    confirmed on nerve conduction studies  . Chronic hepatitis C ([place])    considered cured 06/2016 post retroviral therapy   . Diabetes mellitus type 2, uncontrolled ([place])   . Diabetic peripheral neuropathy ([place])   . GERD (gastroesophageal reflux disease)   . History of endometriosis   . History of Helicobacter infection 11/07  . Hyperlipidemia   . Liver fibrosis 02/17/2016   F2/3       ROS:   Denies fevers, chills, URI symptoms, cough, chest pain, shortness of breath, numbness/tingling, change in bowel or bladder habits.    Assessment / Plan / Recommendations:   Please see A&P under problem oriented charting for assessment of the patient's acute and chronic medical conditions.   As always, pt is advised that if symptoms worsen or new symptoms arise, they should go to an urgent care facility or to to ER for further evaluation.   Consent and Medical Decision Making:   Patient discussed with Dr. Angelia Mould  This is a telephone encounter between [facility] and [name] on 06/02/2019 for muscle spasms. The visit was conducted with the patient located at home and [name] at [facility]. The patient's identity was confirmed using their DOB and current address. The patient has consented to being evaluated through a telephone encounter and understands the associated risks (an examination cannot be done and the patient may need to come in for an appointment) / benefits (allows the patient to remain at home, decreasing exposure to coronavirus). I personally spent 15 minutes on medical discussion.

## 2019-06-02 NOTE — Assessment & Plan Note (Signed)
Patient complains of persistent muscle spasms under rib cage. She has tried the prescription strength Tylenol and heat without much relief. Symptoms wake her from sleep at night. Worse with movement. No other associated symptoms. Given lack of improvement with conservative therapy alone, will try short course of Flexeril. Patient advised not to drive while taking this medication. If continues to have symptoms long-term will likely need to pursue PT referral.  Follow-up telehealth visit in 2 weeks.

## 2019-06-05 DIAGNOSIS — N183 Chronic kidney disease, stage 3 (moderate): Secondary | ICD-10-CM | POA: Diagnosis not present

## 2019-06-05 DIAGNOSIS — E1122 Type 2 diabetes mellitus with diabetic chronic kidney disease: Secondary | ICD-10-CM | POA: Diagnosis not present

## 2019-06-05 NOTE — Progress Notes (Signed)
Internal Medicine Clinic Attending  Case discussed with Dr. Bloomfield at the time of the visit.  We reviewed the resident's history and exam and pertinent patient test results.  I agree with the assessment, diagnosis, and plan of care documented in the resident's note.  

## 2019-06-15 DIAGNOSIS — N183 Chronic kidney disease, stage 3 (moderate): Secondary | ICD-10-CM | POA: Diagnosis not present

## 2019-06-15 DIAGNOSIS — E559 Vitamin D deficiency, unspecified: Secondary | ICD-10-CM | POA: Diagnosis not present

## 2019-06-15 DIAGNOSIS — R801 Persistent proteinuria, unspecified: Secondary | ICD-10-CM | POA: Diagnosis not present

## 2019-06-15 DIAGNOSIS — I129 Hypertensive chronic kidney disease with stage 1 through stage 4 chronic kidney disease, or unspecified chronic kidney disease: Secondary | ICD-10-CM | POA: Diagnosis not present

## 2019-06-15 DIAGNOSIS — E1122 Type 2 diabetes mellitus with diabetic chronic kidney disease: Secondary | ICD-10-CM | POA: Diagnosis not present

## 2019-06-26 ENCOUNTER — Other Ambulatory Visit: Payer: Self-pay

## 2019-06-26 ENCOUNTER — Ambulatory Visit
Admission: RE | Admit: 2019-06-26 | Discharge: 2019-06-26 | Disposition: A | Discharge status: Home / Self Care | Payer: Medicare Other | Source: Ambulatory Visit | Attending: Internal Medicine | Admitting: Internal Medicine

## 2019-06-26 DIAGNOSIS — Z1231 Encounter for screening mammogram for malignant neoplasm of breast: Secondary | ICD-10-CM | POA: Diagnosis not present

## 2019-06-27 ENCOUNTER — Other Ambulatory Visit: Payer: Self-pay | Admitting: Nephrology

## 2019-06-27 DIAGNOSIS — N183 Chronic kidney disease, stage 3 unspecified: Secondary | ICD-10-CM

## 2019-06-28 ENCOUNTER — Other Ambulatory Visit: Payer: Self-pay | Admitting: *Deleted

## 2019-06-28 MED ORDER — SPIRONOLACTONE 50 MG PO TABS: 50.0000 mg | ORAL_TABLET | Freq: Every day | ORAL | 0 refills | Status: DC

## 2019-06-29 NOTE — Addendum Note (Signed)
Addended by: Marcelino Duster on: 06/29/2019 04:11 PM   Modules accepted: Orders

## 2019-06-29 NOTE — Telephone Encounter (Addendum)
Refill request was approved but, it was set to "no print"-so rx was not received by pharmacy.  Will resend to pcp to have rx sent  "normal" (electronically).Despina Hidden Cassady7/23/20204:08 PM

## 2019-06-30 ENCOUNTER — Ambulatory Visit
Admission: RE | Admit: 2019-06-30 | Discharge: 2019-06-30 | Disposition: A | Discharge status: Home / Self Care | Payer: Medicare Other | Source: Ambulatory Visit | Attending: Nephrology | Admitting: Nephrology

## 2019-06-30 ENCOUNTER — Other Ambulatory Visit: Payer: Self-pay

## 2019-06-30 DIAGNOSIS — N183 Chronic kidney disease, stage 3 unspecified: Secondary | ICD-10-CM

## 2019-06-30 DIAGNOSIS — N189 Chronic kidney disease, unspecified: Secondary | ICD-10-CM | POA: Diagnosis not present

## 2019-06-30 MED ORDER — SPIRONOLACTONE 50 MG PO TABS: 50.0000 mg | ORAL_TABLET | Freq: Every day | ORAL | 0 refills | Status: DC

## 2019-06-30 NOTE — Addendum Note (Signed)
Addended by: Adela Ports on: 06/30/2019 05:33 AM   Modules accepted: Orders

## 2019-07-18 ENCOUNTER — Other Ambulatory Visit: Payer: Self-pay | Admitting: Hematology

## 2019-07-18 ENCOUNTER — Other Ambulatory Visit: Payer: Self-pay

## 2019-07-18 ENCOUNTER — Inpatient Hospital Stay: Payer: Medicare Other

## 2019-07-18 ENCOUNTER — Inpatient Hospital Stay: Payer: Medicare Other | Attending: Hematology | Admitting: Hematology

## 2019-07-18 VITALS — BP 183/90 | HR 73 | Temp 98.2°F | Resp 18 | Ht <= 58 in | Wt 173.6 lb

## 2019-07-18 DIAGNOSIS — N049 Nephrotic syndrome with unspecified morphologic changes: Secondary | ICD-10-CM | POA: Diagnosis not present

## 2019-07-18 DIAGNOSIS — N189 Chronic kidney disease, unspecified: Secondary | ICD-10-CM | POA: Insufficient documentation

## 2019-07-18 DIAGNOSIS — D472 Monoclonal gammopathy: Secondary | ICD-10-CM | POA: Insufficient documentation

## 2019-07-18 DIAGNOSIS — Z803 Family history of malignant neoplasm of breast: Secondary | ICD-10-CM | POA: Diagnosis not present

## 2019-07-18 DIAGNOSIS — Z808 Family history of malignant neoplasm of other organs or systems: Secondary | ICD-10-CM | POA: Diagnosis not present

## 2019-07-18 DIAGNOSIS — Z801 Family history of malignant neoplasm of trachea, bronchus and lung: Secondary | ICD-10-CM | POA: Insufficient documentation

## 2019-07-18 DIAGNOSIS — E119 Type 2 diabetes mellitus without complications: Secondary | ICD-10-CM | POA: Diagnosis not present

## 2019-07-18 LAB — CMP (CANCER CENTER ONLY)
ALT: 16 U/L (ref 0–44)
AST: 18 U/L (ref 15–41)
Albumin: 3.4 g/dL — ABNORMAL LOW (ref 3.5–5.0)
Alkaline Phosphatase: 65 U/L (ref 38–126)
Anion gap: 6 (ref 5–15)
BUN: 19 mg/dL (ref 8–23)
CO2: 25 mmol/L (ref 22–32)
Calcium: 9.1 mg/dL (ref 8.9–10.3)
Chloride: 111 mmol/L (ref 98–111)
Creatinine: 1.31 mg/dL — ABNORMAL HIGH (ref 0.44–1.00)
GFR, Est AFR Am: 51 mL/min — ABNORMAL LOW (ref >60)
GFR, Estimated: 44 mL/min — ABNORMAL LOW (ref >60)
Glucose, Bld: 141 mg/dL — ABNORMAL HIGH (ref 70–99)
Potassium: 3.7 mmol/L (ref 3.5–5.1)
Sodium: 142 mmol/L (ref 135–145)
Total Bilirubin: 0.4 mg/dL (ref 0.3–1.2)
Total Protein: 7.1 g/dL (ref 6.5–8.1)

## 2019-07-18 LAB — CBC WITH DIFFERENTIAL/PLATELET
Abs Immature Granulocytes: 0.02 10*3/uL (ref 0.00–0.07)
Basophils Absolute: 0 10*3/uL (ref 0.0–0.1)
Basophils Relative: 1 %
Eosinophils Absolute: 0.1 10*3/uL (ref 0.0–0.5)
Eosinophils Relative: 1 %
HCT: 35.1 % — ABNORMAL LOW (ref 36.0–46.0)
Hemoglobin: 11.5 g/dL — ABNORMAL LOW (ref 12.0–15.0)
Immature Granulocytes: 0 %
Lymphocytes Relative: 43 %
Lymphs Abs: 3.6 10*3/uL (ref 0.7–4.0)
MCH: 27.4 pg (ref 26.0–34.0)
MCHC: 32.8 g/dL (ref 30.0–36.0)
MCV: 83.6 fL (ref 80.0–100.0)
Monocytes Absolute: 0.6 10*3/uL (ref 0.1–1.0)
Monocytes Relative: 7 %
Neutro Abs: 4.1 10*3/uL (ref 1.7–7.7)
Neutrophils Relative %: 48 %
Platelets: 249 10*3/uL (ref 150–400)
RBC: 4.2 MIL/uL (ref 3.87–5.11)
RDW: 14 % (ref 11.5–15.5)
WBC: 8.4 10*3/uL (ref 4.0–10.5)
nRBC: 0 % (ref 0.0–0.2)

## 2019-07-18 LAB — SEDIMENTATION RATE: Sed Rate: 39 mm/hr — ABNORMAL HIGH (ref 0–22)

## 2019-07-18 LAB — LACTATE DEHYDROGENASE: LDH: 157 U/L (ref 98–192)

## 2019-07-18 NOTE — Progress Notes (Signed)
HEMATOLOGY/ONCOLOGY CONSULTATION NOTE  Date of Service: 07/18/2019  Patient Care Team: Al Decant, MD as PCP - General Plyler, Chauncey Reading, RD as Diabetes Educator ([place]) Camillo Flaming, [place] as Referring Physician (Optometry) Ivory Broad, DMD as Referring Physician (Dentistry) Milus Banister, MD as Attending Physician (Gastroenterology)  CHIEF COMPLAINTS/PURPOSE OF CONSULTATION:  r/o monoclonal paraproteinemia   HISTORY OF PRESENTING ILLNESS:   Nancy [name] is a wonderful 63 y.o. female who has been referred to [place] by Dr. Royce Macadamia to rule out monoclonal paraproteinemia. The pt reports that she is doing well overall. She is accompanied today by her mother.  The pt reports that she does not feel any differently now than she did 6 months ago. She says she has been having constipation for a long time.  The pt has had diabetes for 15-20 years, which causes some neuropathy. She has a history of Hepatitis C in 2017. She was treated by Dr. Scharlene Gloss, but cannot remember what she was treated with. She has not been tested recently for Hep C activity.   Most recent lab results (06/05/2019) of CBC is as follows: all values are WNL except for lymphs abs at 4.4k.  On review of systems, pt reports constipation and denies regular nausea, belly pain, and any other symptoms.   On PMHx the pt reports CKD, diabetes, hx of Hepatitis C On Social Hx the pt reports never smoking, hasn't used alcohol in years, she is not currently working  MEDICAL HISTORY:  Past Medical History:  Diagnosis Date  . Carpal tunnel syndrome, bilateral    confirmed on nerve conduction studies  . Chronic hepatitis C ([place])    considered cured 06/2016 post retroviral therapy   . Diabetes mellitus type 2, uncontrolled ([place])   . Diabetic peripheral neuropathy ([place])   . GERD (gastroesophageal reflux disease)   . History of endometriosis   . History of Helicobacter infection 11/07  . Hyperlipidemia   . Liver  fibrosis 02/17/2016   F2/3     SURGICAL HISTORY: Past Surgical History:  Procedure Laterality Date  . ABDOMINAL HYSTERECTOMY    . CARPAL TUNNEL RELEASE      SOCIAL HISTORY: Social History   Socioeconomic History  . Marital status: Single    Spouse name: Not on file  . Number of children: 0  . Years of education: 68  . Highest education level: Not on file  Occupational History  . Occupation: Forensic psychologist: [employer]  Social Needs  . Financial resource strain: Not on file  . Food insecurity    Worry: Not on file    Inability: Not on file  . Transportation needs    Medical: Not on file    Non-medical: Not on file  Tobacco Use  . Smoking status: Never Smoker  . Smokeless tobacco: Never Used  Substance and Sexual Activity  . Alcohol use: No    Alcohol/week: 0.0 standard drinks  . Drug use: No  . Sexual activity: Not Currently    Birth control/protection: None  Lifestyle  . Physical activity    Days per week: Not on file    Minutes per session: Not on file  . Stress: Not on file  Relationships  . Social Herbalist on phone: Not on file    Gets together: Not on file    Attends religious service: Not on file    Active member of club or organization: Not on file    Attends  meetings of clubs or organizations: Not on file    Relationship status: Not on file  . Intimate partner violence    Fear of current or ex partner: Not on file    Emotionally abused: Not on file    Physically abused: Not on file    Forced sexual activity: Not on file  Other Topics Concern  . Not on file  Social History Narrative   Financial assistance approved for 100% discount at [facility] and has [facility] card per [employer]   09/26/2010    FAMILY HISTORY: Family History  Problem Relation Age of Onset  . Diabetes Mother   . Lung cancer Maternal Uncle   . Breast cancer Maternal Grandmother   . Liver cancer Maternal Grandfather   . Heart disease Brother   . Colon cancer Neg Hx      ALLERGIES:  is allergic to ace inhibitors; amitriptyline hcl; latex; and prednisone.  MEDICATIONS:  Current Outpatient Medications  Medication Sig Dispense Refill  . ACCU-CHEK FASTCLIX LANCETS MISC Use to check blood sugar up to 3 times a day 306 each 3  . acetaminophen (TYLENOL) 500 MG tablet Take 1-2 tablets (500-1,000 mg total) by mouth every 6 (six) hours as needed. 100 tablet 0  . atorvastatin (LIPITOR) 40 MG tablet Take 1 tablet (40 mg total) by mouth daily. 90 tablet 3  . Blood Glucose Monitoring Suppl (ACCU-CHEK GUIDE) w/Device KIT 1 each by Does not apply route 3 (three) times daily. Check blood sugar 3 times a day 1 kit 0  . cyclobenzaprine (FLEXERIL) 10 MG tablet Take 1 tablet (10 mg total) by mouth 3 (three) times daily as needed for muscle spasms. 30 tablet 1  . diazepam (VALIUM) 5 MG tablet Take 1 tablet (5 mg total) by mouth at bedtime. 30 tablet 0  . glucose blood (ACCU-CHEK GUIDE) test strip Check blood sugar 3 times a day 300 each 3  . insulin glargine (LANTUS) 100 UNIT/ML injection INJECT 32 UNITS INTO THE SKIN EVERY MORNING WHEN YOU WAKE UP 6 vial 11  . Insulin Pen Needle 32G X 4 MM MISC Please use with liraglutide. Use 0.6 mg once daily for a week. Thereafter, you will take 1.2 mg daily. 100 each 11  . Insulin Syringe-Needle U-100 31G X 15/64" 0.5 ML MISC Use to inject insulin one time a day 100 each 11  . Insulin Syringes, Disposable, U-100 1 ML MISC 2 Syringes by Does not apply route daily. Use to inject insulin into the skin 1 time daily. diag code E11.9. Insulin dependent 100 each 3  . liraglutide (VICTOZA) 18 MG/3ML SOPN Inject 0.2 mLs (1.2 mg total) into the skin daily before breakfast. 180 mL 11  . mometasone (NASONEX) 50 MCG/ACT nasal spray Place 2 sprays into the nose daily. 17 g 12  . olmesartan (BENICAR) 40 MG tablet TAKE ONE TABLET BY MOUTH DAILY 90 tablet 2  . omeprazole (PRILOSEC) 40 MG capsule TAKE ONE CAPSULE BY MOUTH DAILY 90 capsule 0  . senna  (SENOKOT) 8.6 MG tablet Take 1 tablet (8.6 mg total) by mouth as needed for constipation. (Patient taking differently: Take 2 tablets by mouth daily. ) 60 tablet 3  . spironolactone (ALDACTONE) 50 MG tablet Take 1 tablet (50 mg total) by mouth daily. 90 tablet 0  . Wheat Dextrin (BENEFIBER PO) Take 1 tablet by mouth daily as needed (constipation).     No current facility-administered medications for this visit.     REVIEW OF SYSTEMS:  A 10+ POINT REVIEW OF SYSTEMS WAS OBTAINED including neurology, dermatology, psychiatry, cardiac, respiratory, lymph, extremities, GI, GU, Musculoskeletal, constitutional, breasts, reproductive, HEENT.  All pertinent positives are noted in the HPI.  All others are negative.   PHYSICAL EXAMINATION: ECOG PERFORMANCE STATUS: 2 - Symptomatic, <50% confined to bed  . Vitals:   07/18/19 1123  BP: (!) 183/90  Pulse: 73  Resp: 18  Temp: 98.2 F (36.8 C)  SpO2: 100%   Filed Weights   07/18/19 1123  Weight: 173 lb 9.6 oz (78.7 kg)   .Body mass index is 36.28 kg/m.   GENERAL:alert, in no acute distress and comfortable SKIN: no acute rashes, no significant lesions EYES: conjunctiva are pink and non-injected, sclera anicteric OROPHARYNX: MMM, no exudates, no oropharyngeal erythema or ulceration NECK: supple, no JVD LYMPH:  no palpable lymphadenopathy in the cervical, axillary or inguinal regions LUNGS: clear to auscultation b/l with normal respiratory effort HEART: regular rate & rhythm ABDOMEN:  normoactive bowel sounds , non tender, not distended. Extremity: no pedal edema PSYCH: alert & oriented x 3 with fluent speech NEURO: no focal motor/sensory deficits  LABORATORY DATA:  I have reviewed the data as listed  . CBC Latest Ref Rng & Units 07/18/2019 07/12/2018 11/08/2017  WBC 4.0 - 10.5 K/uL 8.4 8.7 -  Hemoglobin 12.0 - 15.0 g/dL 11.5(L) 11.9 11.2(L)  Hematocrit 36.0 - 46.0 % 35.1(L) 35.5 33.0(L)  Platelets 150 - 400 K/uL 249 344 -    . CMP  Latest Ref Rng & Units 07/18/2019 08/24/2018 06/10/2018  Glucose 70 - 99 mg/dL 141(H) 136(H) 140(H)  BUN 8 - 23 mg/dL 19 30(H) 20  Creatinine 0.44 - 1.00 mg/dL 1.31(H) 1.75(H) 1.53(H)  Sodium 135 - 145 mmol/L 142 141 141  Potassium 3.5 - 5.1 mmol/L 3.7 4.3 4.0  Chloride 98 - 111 mmol/L 111 102 104  CO2 22 - 32 mmol/L '25 22 23  '$ Calcium 8.9 - 10.3 mg/dL 9.1 9.5 9.4  Total Protein 6.5 - 8.1 g/dL 7.1 - -  Total Bilirubin 0.3 - 1.2 mg/dL 0.4 - -  Alkaline Phos 38 - 126 U/L 65 - -  AST 15 - 41 U/L 18 - -  ALT 0 - 44 U/L 16 - -   06/05/2019 Serum free light chains:  05/05/2019: Urinalysis  05/05/2019 Protein Electrophoresis   05/05/2019 Protein/creatinine ratio   RADIOGRAPHIC STUDIES: I have personally reviewed the radiological images as listed and agreed with the findings in the report. US Renal  Result Date: 06/30/2019 CLINICAL DATA:  Chronic renal disease. EXAM: RENAL / URINARY TRACT ULTRASOUND COMPLETE COMPARISON:  November 07, 2015 FINDINGS: Right Kidney: Renal measurements: 10.7 x 3.7 x 4.9 cm = volume: 103 mL . Echogenicity within normal limits. No mass or hydronephrosis visualized. Left Kidney: Renal measurements: 9.8 x 4.0 x 5.1 cm = volume: 106 mL. Echogenicity within normal limits. No mass or hydronephrosis visualized. Bladder: Appears normal for degree of bladder distention. IMPRESSION: Normal renal ultrasound. Electronically Signed   By: Fidela Salisbury M.D.   On: 06/30/2019 14:45   Mm 3d Screen Breast Bilateral  Result Date: 06/27/2019 CLINICAL DATA:  Screening. EXAM: DIGITAL SCREENING BILATERAL MAMMOGRAM WITH TOMO AND CAD COMPARISON:  Previous exam(s). ACR Breast Density Category a: The breast tissue is almost entirely fatty. FINDINGS: There are no findings suspicious for malignancy. Images were processed with CAD. IMPRESSION: No mammographic evidence of malignancy. A result letter of this screening mammogram will be mailed directly to the patient. RECOMMENDATION: Screening  mammogram in  one year. (Code:SM-B-01Y) BI-RADS CATEGORY  1: Negative. Electronically Signed   By: Nolon Nations M.D.   On: 06/27/2019 09:27    ASSESSMENT & PLAN:   #1 CKD with Nephrotic syndrome- likely from longstanding DM2 -hx of diabetes for 10+ yrs -no M protein spike -06/05/2019 K:L ratio at 2.85 Elevated light chains and abnormal ratio - likely due to decrease renal excretion of light chains  #2 hx of Hepatitis C in 2017 -treated by Dr. Scharlene Gloss  PLAN: -Discussed patient's labs from 05/05/2019, no M protein spike, K:L ratios were slightly abnormal -Discussed that abnormal K:L ratios is unlikely to be related to MM but due to her negative M protein spike, this is unlikely -Discussed that the most likely explanation for the patient's nephrotic syndrome is her diabetes, which she has had for 10+ years. -Cannot rule out AL amyloidosis at this time -Discussed that nephrotic syndrome is usually further evaluated by a kidney biopsy, but this may not be necessary because diabetes is the more likely cause. Will defer this decision to the nephrologist. -Discussed the CRAB criteria with the pt: no hypercalcemia, CKD with nephrotic syndrome, no anemia, no current concern for bone tumors -No indications for a BM Bx or further bone marrow testing at this time. -Labs today -Will see the pt back in 3 weeks by phone   Labs today Phone visit with Dr Irene Limbo in 3 weeks   Orders Placed This Encounter  Procedures  . CBC with Differential/Platelet    Standing Status:   Future    Number of Occurrences:   1    Standing Expiration Date:   08/21/2020  . CMP ([place] only)    Standing Status:   Future    Number of Occurrences:   1    Standing Expiration Date:   07/17/2020  . Multiple Myeloma Panel (SPEP&IFE w/QIG)    Standing Status:   Future    Number of Occurrences:   1    Standing Expiration Date:   07/17/2020  . Kappa/lambda light chains    Standing Status:   Future    Number of  Occurrences:   1    Standing Expiration Date:   08/21/2020  . Sedimentation rate    Standing Status:   Future    Number of Occurrences:   1    Standing Expiration Date:   07/17/2020  . Lactate dehydrogenase    Standing Status:   Future    Number of Occurrences:   1    Standing Expiration Date:   07/17/2020  . 24-Hr Ur UPEP/UIFE/Light Chains/TP    Standing Status:   Future    Number of Occurrences:   1    Standing Expiration Date:   07/17/2020   All of the patients questions were answered with apparent satisfaction. The patient knows to call the clinic with any problems, questions or concerns.  I spent 30 minutes counseling the patient face to face. The total time spent in the appointment was 35mns and more than 50% was on counseling and direct patient cares.    GSullivan LoneMD MS AAHIVMS SSchleicher County Medical CenterCBelmont Community HospitalHematology/Oncology Physician [facility] (Office):       [phone](Work cell):  [phone](Fax):           [phone] 07/18/2019 12:29 PM  I, EDe Burrs am acting as a scribe for Dr. KIrene Limbo .I have reviewed the above documentation for accuracy and completeness, and I agree with the above. .Marland Kitchen  Brunetta Genera MD

## 2019-07-19 LAB — MULTIPLE MYELOMA PANEL, SERUM
Albumin SerPl Elph-Mcnc: 3.2 g/dL (ref 2.9–4.4)
Albumin/Glob SerPl: 1.1 (ref 0.7–1.7)
Alpha 1: 0.2 g/dL (ref 0.0–0.4)
Alpha2 Glob SerPl Elph-Mcnc: 0.9 g/dL (ref 0.4–1.0)
B-Globulin SerPl Elph-Mcnc: 0.9 g/dL (ref 0.7–1.3)
Gamma Glob SerPl Elph-Mcnc: 1 g/dL (ref 0.4–1.8)
Globulin, Total: 3 g/dL (ref 2.2–3.9)
IgA: 145 mg/dL (ref 87–352)
IgG (Immunoglobin G), Serum: 1110 mg/dL (ref 586–1602)
IgM (Immunoglobulin M), Srm: 91 mg/dL (ref 26–217)
Total Protein ELP: 6.2 g/dL (ref 6.0–8.5)

## 2019-07-19 LAB — KAPPA/LAMBDA LIGHT CHAINS
Kappa free light chain: 59.9 mg/L — ABNORMAL HIGH (ref 3.3–19.4)
Kappa, lambda light chain ratio: 2.64 — ABNORMAL HIGH (ref 0.26–1.65)
Lambda free light chains: 22.7 mg/L (ref 5.7–26.3)

## 2019-07-21 DIAGNOSIS — E119 Type 2 diabetes mellitus without complications: Secondary | ICD-10-CM | POA: Diagnosis not present

## 2019-07-21 DIAGNOSIS — D472 Monoclonal gammopathy: Secondary | ICD-10-CM | POA: Diagnosis not present

## 2019-07-21 DIAGNOSIS — Z803 Family history of malignant neoplasm of breast: Secondary | ICD-10-CM | POA: Diagnosis not present

## 2019-07-21 DIAGNOSIS — Z801 Family history of malignant neoplasm of trachea, bronchus and lung: Secondary | ICD-10-CM | POA: Diagnosis not present

## 2019-07-21 DIAGNOSIS — N189 Chronic kidney disease, unspecified: Secondary | ICD-10-CM | POA: Diagnosis not present

## 2019-07-21 DIAGNOSIS — Z808 Family history of malignant neoplasm of other organs or systems: Secondary | ICD-10-CM | POA: Diagnosis not present

## 2019-07-21 DIAGNOSIS — N049 Nephrotic syndrome with unspecified morphologic changes: Secondary | ICD-10-CM | POA: Diagnosis not present

## 2019-07-24 LAB — UPEP/UIFE/LIGHT CHAINS/TP, 24-HR UR
% BETA, Urine: 10.1 %
ALPHA 1 URINE: 5.5 %
Albumin, U: 69.9 %
Alpha 2, Urine: 4.1 %
Free Kappa Lt Chains,Ur: 166.9 mg/L — ABNORMAL HIGH (ref 0.63–113.79)
Free Kappa/Lambda Ratio: 10.82 (ref 1.03–31.76)
Free Lambda Lt Chains,Ur: 15.43 mg/L — ABNORMAL HIGH (ref 0.47–11.77)
GAMMA GLOBULIN URINE: 10.5 %
Total Protein, Urine-Ur/day: 3602 mg/24 hr — ABNORMAL HIGH (ref 30–150)
Total Protein, Urine: 423.8 mg/dL

## 2019-07-25 DIAGNOSIS — E113212 Type 2 diabetes mellitus with mild nonproliferative diabetic retinopathy with macular edema, left eye: Secondary | ICD-10-CM | POA: Diagnosis not present

## 2019-07-25 DIAGNOSIS — E113291 Type 2 diabetes mellitus with mild nonproliferative diabetic retinopathy without macular edema, right eye: Secondary | ICD-10-CM | POA: Diagnosis not present

## 2019-07-27 ENCOUNTER — Ambulatory Visit (INDEPENDENT_AMBULATORY_CARE_PROVIDER_SITE_OTHER): Payer: Medicare Other | Admitting: Internal Medicine

## 2019-07-27 ENCOUNTER — Ambulatory Visit (HOSPITAL_COMMUNITY)
Admission: RE | Admit: 2019-07-27 | Discharge: 2019-07-27 | Disposition: A | Discharge status: Home / Self Care | Payer: Medicare Other | Source: Ambulatory Visit | Attending: Student in an Organized Health Care Education/Training Program | Admitting: Student in an Organized Health Care Education/Training Program

## 2019-07-27 ENCOUNTER — Other Ambulatory Visit: Payer: Self-pay

## 2019-07-27 ENCOUNTER — Other Ambulatory Visit: Payer: Self-pay | Admitting: Internal Medicine

## 2019-07-27 VITALS — BP 148/92 | HR 73 | Temp 98.0°F | Ht <= 58 in | Wt 170.0 lb

## 2019-07-27 DIAGNOSIS — M25552 Pain in left hip: Secondary | ICD-10-CM

## 2019-07-27 DIAGNOSIS — R1032 Left lower quadrant pain: Secondary | ICD-10-CM | POA: Diagnosis not present

## 2019-07-27 DIAGNOSIS — R1030 Lower abdominal pain, unspecified: Secondary | ICD-10-CM | POA: Insufficient documentation

## 2019-07-27 DIAGNOSIS — G5603 Carpal tunnel syndrome, bilateral upper limbs: Secondary | ICD-10-CM

## 2019-07-27 DIAGNOSIS — Z6835 Body mass index (BMI) 35.0-35.9, adult: Secondary | ICD-10-CM | POA: Diagnosis not present

## 2019-07-27 DIAGNOSIS — M160 Bilateral primary osteoarthritis of hip: Secondary | ICD-10-CM | POA: Diagnosis not present

## 2019-07-27 NOTE — Progress Notes (Signed)
Internal Medicine Clinic Attending  Case discussed with Dr. Agyei at the time of the visit.  We reviewed the resident's history and exam and pertinent patient test results.  I agree with the assessment, diagnosis, and plan of care documented in the resident's note.    

## 2019-07-27 NOTE — Progress Notes (Signed)
   CC: Follow-up carpal tunnel, left groin pain  HPI:  [name] is a 63 y.o. woman with medical history listed below presenting for evaluation of bilateral carpal tunnel and left groin pain.  Please see problem based charting for further details.  Past Medical History:  Diagnosis Date  . Carpal tunnel syndrome, bilateral    confirmed on nerve conduction studies  . Chronic hepatitis C ([place])    considered cured 06/2016 post retroviral therapy   . Diabetes mellitus type 2, uncontrolled ([place])   . Diabetic peripheral neuropathy ([place])   . GERD (gastroesophageal reflux disease)   . History of endometriosis   . History of Helicobacter infection 11/07  . Hyperlipidemia   . Liver fibrosis 02/17/2016   F2/3    Review of Systems: As per HPI  Physical Exam:  Vitals:   07/27/19 1356 07/27/19 1401  BP: (!) 158/90 (!) 148/92  Pulse: 73   Temp: 98 F (36.7 C)   SpO2: 100%   Weight: 170 lb (77.1 kg)   Height: 4\' 10"  (1.473 m)    Physical Exam  Constitutional: She is well-developed, well-nourished, and in no distress. No distress.  HENT:  Head: Normocephalic and atraumatic.  Eyes: Conjunctivae are normal.  Cardiovascular: Normal rate, regular rhythm and normal heart sounds.  Pulmonary/Chest: Effort normal and breath sounds normal.  Musculoskeletal:        General: No edema.     Comments: -Positive Phalen and Tinel sign - Negative straight leg test bilaterally - Range of motion intact at the hips bilaterally - Internal and external rotation of the hips elicit only minimal pain on the left side  Skin: She is not diaphoretic.    Assessment & Plan:   See Encounters Tab for problem based charting.  Patient discussed with Dr. Evette Doffing

## 2019-07-27 NOTE — Assessment & Plan Note (Signed)
Bilateral carpal tunnel syndrome: [name] has a longstanding history of bilateral carpal tunnel and is status post right carpal tunnel release performed at [facility] years ago.  She had been scheduled to eventually undergo left carpal tunnel release but she was adamant to undergo the procedure since she did not get much relief on the right side.  Over the last several months, she has been experiencing numbness and tingling of her fingers at the median nerve distribution bilaterally which has not improved with wrist splints.  On physical exams, she did have positive Phalen and Tinel sign.  Plan: -We will refer to sports medicine to consider ultrasound-guided Hydro dissection of the median nerves and if no improvement can reconsider surgery -Advised to continue using wrist splints

## 2019-07-27 NOTE — Patient Instructions (Signed)
Ms. Martie Round,  It was a pleasure taking of your the clinic today.  Regarding your carpal tunnel pain, there are several steps that we can take before surgery and I will refer you to sports medicine for possible injection.  I would also encourage you to continue using the wrist splints as well.  For your hip pain, the most likely cause is osteoarthritis of your hip joint and I am going to get an x-ray to evaluate further.  Because of your kidney issues, I would advise that you stay away from ibuprofen, Motrin or Aleve.  You can try Tylenol  Take Care! Dr. Eileen Stanford  Please call the internal medicine center clinic if you have any questions or concerns, we may be able to help and keep you from a long and expensive emergency room wait. Our clinic and after hours phone number is [phone], the best time to call is Monday through Friday 9 am to 4 pm but there is always someone available 24/7 if you have an emergency. If you need medication refills please notify your pharmacy one week in advance and they will send [place] a request.

## 2019-07-27 NOTE — Assessment & Plan Note (Signed)
Left groin pain: [name] is a morbidly obese African-American woman presenting with a 45-month history of left groin pain that sometimes radiates to her knee.  The pain is indolent and she denies any aggravating or alleviating factors.  She did mention that sometimes she would feel the pain on the right side as well.  She has not tried anything to make this better due to her renal disease.  The pain does not worsen with ambulation and she does not mention any radiculopathic symptoms of pain radiating from the buttock to the tip of the toes.  The pain does not limit her ADLs  On physical exams, straight leg test was negative bilaterally though she did complain of pain at the back of the thigh.  Internal and external rotation of the hip joint did not elicit pain.  ABIs performed in the clinic was unremarkable.  Assessment: Given her classic symptoms of left groin pain and risk factor of obesity, osteoarthritis of the hip joint is the most likely diagnosis.  Plan: - Follow-up standing/weightbearing x-ray of the hip

## 2019-07-31 ENCOUNTER — Telehealth: Payer: Self-pay | Admitting: Internal Medicine

## 2019-07-31 NOTE — Telephone Encounter (Signed)
I was able to reach out to Nancy [name] to discuss her pelvic x-rays.  Overall it shows minimal degenerative changes of the hips.  I advised her that she could take Tylenol as needed for pain though not excessively.  She expressed understanding.

## 2019-08-04 ENCOUNTER — Ambulatory Visit (INDEPENDENT_AMBULATORY_CARE_PROVIDER_SITE_OTHER): Payer: Medicare Other | Admitting: Family Medicine

## 2019-08-04 ENCOUNTER — Ambulatory Visit: Payer: Self-pay

## 2019-08-04 ENCOUNTER — Other Ambulatory Visit: Payer: Self-pay

## 2019-08-04 ENCOUNTER — Encounter: Payer: Self-pay | Admitting: Family Medicine

## 2019-08-04 VITALS — BP 144/90 | Ht <= 58 in | Wt 170.0 lb

## 2019-08-04 DIAGNOSIS — M25531 Pain in right wrist: Secondary | ICD-10-CM | POA: Diagnosis not present

## 2019-08-04 DIAGNOSIS — M25532 Pain in left wrist: Secondary | ICD-10-CM

## 2019-08-04 DIAGNOSIS — G5603 Carpal tunnel syndrome, bilateral upper limbs: Secondary | ICD-10-CM

## 2019-08-04 MED ORDER — METHYLPREDNISOLONE ACETATE 40 MG/ML IJ SUSP
40.0000 mg | Freq: Once | INTRAMUSCULAR | Status: AC
  Administered 2019-08-04: 11:00:00 40 mg via INTRA_ARTICULAR

## 2019-08-04 NOTE — Progress Notes (Signed)
PCP: Al Decant, MD  Subjective:   HPI: Patient is a 63 y.o. female here for evaluation of bilateral wrist pain.  Patient notes the pain is been present for many years now.  She was diagnosed with bilateral carpal tunnel syndrome.  She endorses injections into both wrists in the past which have not helped her pain.  Couple years ago she had carpal tunnel release on her right arm.  She notes this provided very minimal relief from her symptoms.  The pain starts in her wrist and radiates down into her first 3 fingers in both hands.  She does have associated numbness and tingling.  She also endorses swelling of bilateral hands.  She has no overlying skin changes.  She notes night splints of not helped her symptoms at all.  Over-the-counter medications including Tylenol and ibuprofen have not helped her symptoms at all.   Review of Systems: See HPI above.  Past Medical History:  Diagnosis Date  . Carpal tunnel syndrome, bilateral    confirmed on nerve conduction studies  . Chronic hepatitis C ([place])    considered cured 06/2016 post retroviral therapy   . Diabetes mellitus type 2, uncontrolled ([place])   . Diabetic peripheral neuropathy ([place])   . GERD (gastroesophageal reflux disease)   . History of endometriosis   . History of Helicobacter infection 11/07  . Hyperlipidemia   . Liver fibrosis 02/17/2016   F2/3     Current Outpatient Medications on File Prior to Visit  Medication Sig Dispense Refill  . atorvastatin (LIPITOR) 10 MG tablet Take 40 mg by mouth daily.    . insulin glargine (LANTUS) 100 UNIT/ML injection INJECT 32 UNITS INTO THE SKIN EVERY MORNING WHEN YOU WAKE UP 6 vial 11  . liraglutide (VICTOZA) 18 MG/3ML SOPN Inject 0.2 mLs (1.2 mg total) into the skin daily before breakfast. 180 mL 11  . olmesartan (BENICAR) 40 MG tablet TAKE ONE TABLET BY MOUTH DAILY 90 tablet 2  . omeprazole (PRILOSEC) 40 MG capsule TAKE ONE CAPSULE BY MOUTH DAILY 90 capsule 0  . senna (SENOKOT) 8.6 MG  tablet Take 1 tablet (8.6 mg total) by mouth as needed for constipation. (Patient taking differently: Take 2 tablets by mouth daily. ) 60 tablet 3  . ACCU-CHEK FASTCLIX LANCETS MISC Use to check blood sugar up to 3 times a day 306 each 3  . acetaminophen (TYLENOL) 500 MG tablet Take 1-2 tablets (500-1,000 mg total) by mouth every 6 (six) hours as needed. (Patient not taking: Reported on 08/04/2019) 100 tablet 0  . atorvastatin (LIPITOR) 40 MG tablet Take 1 tablet (40 mg total) by mouth daily. (Patient not taking: Reported on 08/04/2019) 90 tablet 3  . Blood Glucose Monitoring Suppl (ACCU-CHEK GUIDE) w/Device KIT 1 each by Does not apply route 3 (three) times daily. Check blood sugar 3 times a day 1 kit 0  . cyclobenzaprine (FLEXERIL) 10 MG tablet Take 1 tablet (10 mg total) by mouth 3 (three) times daily as needed for muscle spasms. (Patient not taking: Reported on 08/04/2019) 30 tablet 1  . diazepam (VALIUM) 5 MG tablet Take 1 tablet (5 mg total) by mouth at bedtime. (Patient not taking: Reported on 08/04/2019) 30 tablet 0  . glucose blood (ACCU-CHEK GUIDE) test strip Check blood sugar 3 times a day 300 each 3  . Insulin Pen Needle 32G X 4 MM MISC Please use with liraglutide. Use 0.6 mg once daily for a week. Thereafter, you will take 1.2 mg daily. 100 each 11  .  Insulin Syringe-Needle U-100 31G X 15/64" 0.5 ML MISC Use to inject insulin one time a day 100 each 11  . Insulin Syringes, Disposable, U-100 1 ML MISC 2 Syringes by Does not apply route daily. Use to inject insulin into the skin 1 time daily. diag code E11.9. Insulin dependent 100 each 3  . mometasone (NASONEX) 50 MCG/ACT nasal spray Place 2 sprays into the nose daily. (Patient not taking: Reported on 08/04/2019) 17 g 12  . spironolactone (ALDACTONE) 50 MG tablet Take 1 tablet (50 mg total) by mouth daily. (Patient not taking: Reported on 08/04/2019) 90 tablet 0  . Wheat Dextrin (BENEFIBER PO) Take 1 tablet by mouth daily as needed (constipation).      No current facility-administered medications on file prior to visit.     Past Surgical History:  Procedure Laterality Date  . ABDOMINAL HYSTERECTOMY    . CARPAL TUNNEL RELEASE      Allergies  Allergen Reactions  . Ace Inhibitors Cough    Persistent dry cough  . Amitriptyline Hcl Nausea And Vomiting  . Latex Itching  . Prednisone Nausea And Vomiting    Social History   Socioeconomic History  . Marital status: Single    Spouse name: Not on file  . Number of children: 0  . Years of education: 57  . Highest education level: Not on file  Occupational History  . Occupation: Forensic psychologist: [employer]  Social Needs  . Financial resource strain: Not on file  . Food insecurity    Worry: Not on file    Inability: Not on file  . Transportation needs    Medical: Not on file    Non-medical: Not on file  Tobacco Use  . Smoking status: Never Smoker  . Smokeless tobacco: Never Used  Substance and Sexual Activity  . Alcohol use: No    Alcohol/week: 0.0 standard drinks  . Drug use: No  . Sexual activity: Not Currently    Birth control/protection: None  Lifestyle  . Physical activity    Days per week: Not on file    Minutes per session: Not on file  . Stress: Not on file  Relationships  . Social Herbalist on phone: Not on file    Gets together: Not on file    Attends religious service: Not on file    Active member of club or organization: Not on file    Attends meetings of clubs or organizations: Not on file    Relationship status: Not on file  . Intimate partner violence    Fear of current or ex partner: Not on file    Emotionally abused: Not on file    Physically abused: Not on file    Forced sexual activity: Not on file  Other Topics Concern  . Not on file  Social History Narrative   Financial assistance approved for 100% discount at [facility] and has [facility] card per [employer]   09/26/2010    Family History  Problem Relation Age of Onset  .  Diabetes Mother   . Lung cancer Maternal Uncle   . Breast cancer Maternal Grandmother   . Liver cancer Maternal Grandfather   . Heart disease Brother   . Colon cancer Neg Hx         Objective:  Physical Exam: BP (!) 144/90   Ht _0  (1.473 m)   Wt 170 lb (77.1 kg)   LMP 02/15/2009   BMI 35.53 kg/m  Gen: NAD, comfortable in exam room Lungs: Breathing comfortably on room air Hand/wrist Exam Right -Inspection: No deformity, no discoloration -Palpation: Tenderness palpation along the anterior aspect of the wrist -ROM: Flexion: 75 degrees; Extension: 70 degrees; Radial deviation: 20 degrees; Ulnar deviation: 25 degrees; Pronation: 70 degrees; Supination: 75 degrees -Strength: Flexion: 5/5; Extension: 5/5; Radial Deviation: 5/5; Ulnar Deviation: 5/5 -Special Tests: Tinels: Positive; Phalens: Positive -Limb neurovascularly intact  Left -Inspection: No deformity, no discoloration -Palpation: Tenderness palpation along the anterior aspect of the wrist -ROM: Flexion: 75 degrees; Extension: 70 degrees; Radial deviation: 20 degrees; Ulnar deviation: 25 degrees; Pronation: 70 degrees; Supination: 75 degrees -Strength: Flexion: 5/5; Extension: 5/5; Radial Deviation: 5/5; Ulnar Deviation: 5/5 -Special Tests: Tinels: Positive; Phalens: Positive -Limb neurovascularly intact   Limited diagnostic ultrasound of bilateral wrists Findings: - Right median nerve measuring 0.09 cm -Left median nerve measuring 0.09 cm - Fluid noted within the carpal tunnel at the left wrist Impression: - Borderline measurements of the median nerve bilaterally suggestive of chronic bilateral carpal tunnel syndrome   Assessment & Plan:  Patient is a 63 y.o. female here for bilateral wrist pain  1.  Bilateral carpal tunnel syndrome -Patient with history of carpal tunnel release on right hand as well as failing conservative treatment such as night splints - Patient was consented for a corticosteroid injection  of her left carpal tunnel as this is the side that has not had surgery.  Timeout was performed prior to the injection.  20 mg of Depo-Medrol and 0.5 cc of lidocaine were injected into the carpal tunnel with in close proximity to the median nerve using ultrasound guidance.  Patient tolerated the procedure well there were no complications - Patient to follow-up as needed for her carpal tunnel syndrome.  If there is no improvement with this injection we will consider getting nerve conduction studies at a subsequent visit

## 2019-08-04 NOTE — Progress Notes (Signed)
[facility]: Attending Note: I have reviewed the chart, discussed wit the Sports Medicine Fellow. I agree with assessment and treatment plan as detailed in the [place] note. She has had right carpal tunnel release and reportedly had very little or no improvement from that so I am not exactly sure what is going on the right side.  The left side has not been surgically intervened upon.  We discussed at length.  I think there is less than a 50% chance that this will help her, but I do not have a lot of other options so we decided to do a corticosteroid injection under ultrasound guidance today and follow her up in 3 to 4 weeks on the left wrist.

## 2019-08-07 NOTE — Progress Notes (Signed)
HEMATOLOGY/ONCOLOGY CONSULTATION NOTE  Date of Service: 08/08/2019  Patient Care Team: Al Decant, MD as PCP - General Plyler, Chauncey Reading, RD as Diabetes Educator ([place]) Camillo Flaming, [address] as Referring Physician (Optometry) Ivory Broad, DMD as Referring Physician (Dentistry) Milus Banister, MD as Attending Physician (Gastroenterology)  CHIEF COMPLAINTS/PURPOSE OF CONSULTATION:  r/o monoclonal paraproteinemia   HISTORY OF PRESENTING ILLNESS:   Nancy [name] is a wonderful 64 y.o. female who has been referred to [place] by Dr. Royce Macadamia to rule out monoclonal paraproteinemia. The pt reports that she is doing well overall. She is accompanied today by her mother.  The pt reports that she does not feel any differently now than she did 6 months ago. She says she has been having constipation for a long time.  The pt has had diabetes for 15-20 years, which causes some neuropathy. She has a history of Hepatitis C in 2017. She was treated by Dr. Scharlene Gloss, but cannot remember what she was treated with. She has not been tested recently for Hep C activity.   Most recent lab results (06/05/2019) of CBC is as follows: all values are WNL except for lymphs abs at 4.4k.  On review of systems, pt reports constipation and denies regular nausea, belly pain, and any other symptoms.   On PMHx the pt reports CKD, diabetes, hx of Hepatitis C On Social Hx the pt reports never smoking, hasn't used alcohol in years, she is not currently working  INTERVAL HISTORY:   Nancy [name] is a wonderful 63 y.o. female who is here to evaluate her ? monoclonal paraproteinemia. The patient's last visit with [place] was on 07/18/2019. The pt reports that she is doing well overall. She is accompanied by her mother today.  The pt reports that she is feeling good today.   Lab results (07/18/19) of CBC w/diff and CMP is as follows: all values are WNL except for Hgb at 11.5, HCT at 35.1, Glucose at 141, Creatinine at  1.31, Albumin at 3.4, GFR Est AFR Am at 51. 07/18/2019 MMP is all values are WNL no  M spike 07/18/2019 K/L light chains shows Kappa free light chain at 59.9, Lamda free light chains at 22.7, Kappa lambda light chain ratio at 2.64.  07/18/2019 Sed rate is 39 07/18/2019 LDH is 157 07/21/2019 24-Hr Ur UPEP/UIFE/Light Chains/TP all values are WNL except for Total Protein, Urine-UR/day at 3602, Free Kappa Lt Chains Ur at 166.90, Free Lambda Lt Chains Ur at 15.43    On review of systems, pt denies abdominal pain and any other symptoms.   MEDICAL HISTORY:  Past Medical History:  Diagnosis Date  . Carpal tunnel syndrome, bilateral    confirmed on nerve conduction studies  . Chronic hepatitis C ([place])    considered cured 06/2016 post retroviral therapy   . Diabetes mellitus type 2, uncontrolled ([place])   . Diabetic peripheral neuropathy ([place])   . GERD (gastroesophageal reflux disease)   . History of endometriosis   . History of Helicobacter infection 11/07  . Hyperlipidemia   . Liver fibrosis 02/17/2016   F2/3     SURGICAL HISTORY: Past Surgical History:  Procedure Laterality Date  . ABDOMINAL HYSTERECTOMY    . CARPAL TUNNEL RELEASE      SOCIAL HISTORY: Social History   Socioeconomic History  . Marital status: Single    Spouse name: Not on file  . Number of children: 0  . Years of education: 73  . Highest education level: Not on  file  Occupational History  . Occupation: Forensic psychologist: [employer]  Social Needs  . Financial resource strain: Not on file  . Food insecurity    Worry: Not on file    Inability: Not on file  . Transportation needs    Medical: Not on file    Non-medical: Not on file  Tobacco Use  . Smoking status: Never Smoker  . Smokeless tobacco: Never Used  Substance and Sexual Activity  . Alcohol use: No    Alcohol/week: 0.0 standard drinks  . Drug use: No  . Sexual activity: Not Currently    Birth control/protection: None  Lifestyle  . Physical  activity    Days per week: Not on file    Minutes per session: Not on file  . Stress: Not on file  Relationships  . Social Herbalist on phone: Not on file    Gets together: Not on file    Attends religious service: Not on file    Active member of club or organization: Not on file    Attends meetings of clubs or organizations: Not on file    Relationship status: Not on file  . Intimate partner violence    Fear of current or ex partner: Not on file    Emotionally abused: Not on file    Physically abused: Not on file    Forced sexual activity: Not on file  Other Topics Concern  . Not on file  Social History Narrative   Financial assistance approved for 100% discount at [facility] and has [facility] card per [employer]   09/26/2010    FAMILY HISTORY: Family History  Problem Relation Age of Onset  . Diabetes Mother   . Lung cancer Maternal Uncle   . Breast cancer Maternal Grandmother   . Liver cancer Maternal Grandfather   . Heart disease Brother   . Colon cancer Neg Hx     ALLERGIES:  is allergic to ace inhibitors; amitriptyline hcl; latex; and prednisone.  MEDICATIONS:  Current Outpatient Medications  Medication Sig Dispense Refill  . ACCU-CHEK FASTCLIX LANCETS MISC Use to check blood sugar up to 3 times a day 306 each 3  . acetaminophen (TYLENOL) 500 MG tablet Take 1-2 tablets (500-1,000 mg total) by mouth every 6 (six) hours as needed. (Patient not taking: Reported on 08/04/2019) 100 tablet 0  . atorvastatin (LIPITOR) 10 MG tablet Take 40 mg by mouth daily.    Marland Kitchen atorvastatin (LIPITOR) 40 MG tablet Take 1 tablet (40 mg total) by mouth daily. (Patient not taking: Reported on 08/04/2019) 90 tablet 3  . Blood Glucose Monitoring Suppl (ACCU-CHEK GUIDE) w/Device KIT 1 each by Does not apply route 3 (three) times daily. Check blood sugar 3 times a day 1 kit 0  . cyclobenzaprine (FLEXERIL) 10 MG tablet Take 1 tablet (10 mg total) by mouth 3 (three) times daily as needed for muscle  spasms. (Patient not taking: Reported on 08/04/2019) 30 tablet 1  . diazepam (VALIUM) 5 MG tablet Take 1 tablet (5 mg total) by mouth at bedtime. (Patient not taking: Reported on 08/04/2019) 30 tablet 0  . glucose blood (ACCU-CHEK GUIDE) test strip Check blood sugar 3 times a day 300 each 3  . insulin glargine (LANTUS) 100 UNIT/ML injection INJECT 32 UNITS INTO THE SKIN EVERY MORNING WHEN YOU WAKE UP 6 vial 11  . Insulin Pen Needle 32G X 4 MM MISC Please use with liraglutide. Use 0.6 mg once daily for  a week. Thereafter, you will take 1.2 mg daily. 100 each 11  . Insulin Syringe-Needle U-100 31G X 15/64" 0.5 ML MISC Use to inject insulin one time a day 100 each 11  . Insulin Syringes, Disposable, U-100 1 ML MISC 2 Syringes by Does not apply route daily. Use to inject insulin into the skin 1 time daily. diag code E11.9. Insulin dependent 100 each 3  . liraglutide (VICTOZA) 18 MG/3ML SOPN Inject 0.2 mLs (1.2 mg total) into the skin daily before breakfast. 180 mL 11  . mometasone (NASONEX) 50 MCG/ACT nasal spray Place 2 sprays into the nose daily. (Patient not taking: Reported on 08/04/2019) 17 g 12  . olmesartan (BENICAR) 40 MG tablet TAKE ONE TABLET BY MOUTH DAILY 90 tablet 2  . omeprazole (PRILOSEC) 40 MG capsule TAKE ONE CAPSULE BY MOUTH DAILY 90 capsule 0  . senna (SENOKOT) 8.6 MG tablet Take 1 tablet (8.6 mg total) by mouth as needed for constipation. (Patient taking differently: Take 2 tablets by mouth daily. ) 60 tablet 3  . spironolactone (ALDACTONE) 50 MG tablet Take 1 tablet (50 mg total) by mouth daily. (Patient not taking: Reported on 08/04/2019) 90 tablet 0  . Wheat Dextrin (BENEFIBER PO) Take 1 tablet by mouth daily as needed (constipation).     No current facility-administered medications for this visit.     REVIEW OF SYSTEMS:     A 10+ POINT REVIEW OF SYSTEMS WAS OBTAINED including neurology, dermatology, psychiatry, cardiac, respiratory, lymph, extremities, GI, GU, Musculoskeletal,  constitutional, breasts, reproductive, HEENT.  All pertinent positives are noted in the HPI.  All others are negative.   PHYSICAL EXAMINATION: ECOG PERFORMANCE STATUS: 2 - Symptomatic, <50% confined to bed  . Vitals:   08/08/19 1448  BP: (!) 181/103  Pulse: 69  Resp: 18  Temp: 98.5 F (36.9 C)  SpO2: 100%   Filed Weights   08/08/19 1448  Weight: 173 lb 14.4 oz (78.9 kg)   .Body mass index is 36.35 kg/m.   Exam was given in a chair   GENERAL:alert, in no acute distress and comfortable SKIN: no acute rashes, no significant lesions EYES: conjunctiva are pink and non-injected, sclera anicteric OROPHARYNX: MMM, no exudates, no oropharyngeal erythema or ulceration NECK: supple, no JVD LYMPH:  no palpable lymphadenopathy in the cervical, axillary or inguinal regions LUNGS: clear to auscultation b/l with normal respiratory effort HEART: regular rate & rhythm ABDOMEN:  normoactive bowel sounds , non tender, not distended. No palpable hepatosplenomegaly.  Extremity: no pedal edema PSYCH: alert & oriented x 3 with fluent speech NEURO: no focal motor/sensory deficits  LABORATORY DATA:  I have reviewed the data as listed  . CBC Latest Ref Rng & Units 07/18/2019 07/12/2018 11/08/2017  WBC 4.0 - 10.5 K/uL 8.4 8.7 -  Hemoglobin 12.0 - 15.0 g/dL 11.5(L) 11.9 11.2(L)  Hematocrit 36.0 - 46.0 % 35.1(L) 35.5 33.0(L)  Platelets 150 - 400 K/uL 249 344 -    . CMP Latest Ref Rng & Units 07/18/2019 08/24/2018 06/10/2018  Glucose 70 - 99 mg/dL 141(H) 136(H) 140(H)  BUN 8 - 23 mg/dL 19 30(H) 20  Creatinine 0.44 - 1.00 mg/dL 1.31(H) 1.75(H) 1.53(H)  Sodium 135 - 145 mmol/L 142 141 141  Potassium 3.5 - 5.1 mmol/L 3.7 4.3 4.0  Chloride 98 - 111 mmol/L 111 102 104  CO2 22 - 32 mmol/L '25 22 23  ' Calcium 8.9 - 10.3 mg/dL 9.1 9.5 9.4  Total Protein 6.5 - 8.1 g/dL 7.1 - -  Total Bilirubin 0.3 - 1.2 mg/dL 0.4 - -  Alkaline Phos 38 - 126 U/L 65 - -  AST 15 - 41 U/L 18 - -  ALT 0 - 44 U/L 16 - -    Component     Latest Ref Rng & Units 07/18/2019  IgG (Immunoglobin G), Serum     586 - 1,602 mg/dL 1,110  IgA     87 - 352 mg/dL 145  IgM (Immunoglobulin M), Srm     26 - 217 mg/dL 91  Total Protein ELP     6.0 - 8.5 g/dL 6.2  Albumin SerPl Elph-Mcnc     2.9 - 4.4 g/dL 3.2  Alpha 1     0.0 - 0.4 g/dL 0.2  Alpha2 Glob SerPl Elph-Mcnc     0.4 - 1.0 g/dL 0.9  B-Globulin SerPl Elph-Mcnc     0.7 - 1.3 g/dL 0.9  Gamma Glob SerPl Elph-Mcnc     0.4 - 1.8 g/dL 1.0  M Protein SerPl Elph-Mcnc     Not Observed g/dL Not Observed  Globulin, Total     2.2 - 3.9 g/dL 3.0  Albumin/Glob SerPl     0.7 - 1.7 1.1  IFE 1      Comment  Please Note (HCV):      Comment  Kappa free light chain     3.3 - 19.4 mg/L 59.9 (H)  Lamda free light chains     5.7 - 26.3 mg/L 22.7  Kappa, lamda light chain ratio     0.26 - 1.65 2.64 (H)  LDH     98 - 192 U/L 157  Sed Rate     0 - 22 mm/hr 39 (H)   07/21/2019 24-Hr Ur UPEP/UIFE/Light Chains/TP Component     Latest Ref Rng & Units 07/21/2019  Total Protein, Urine-UPE24     Not Estab. mg/dL 423.8  Total Protein, Urine-Ur/day     30 - 150 mg/24 hr 3,602 (H)  ALBUMIN, U     % 69.9  ALPHA 1 URINE     % 5.5  Alpha 2, Urine     % 4.1  % BETA, Urine     % 10.1  GAMMA GLOBULIN URINE     % 10.5  Free Kappa Lt Chains,Ur     0.63 - 113.79 mg/L 166.90 (H)  Free Lambda Lt Chains,Ur     0.47 - 11.77 mg/L 15.43 (H)  Free Kappa/Lambda Ratio     1.03 - 31.76 10.82  Immunofixation Result, Urine      Comment  Total Volume      896m  M-SPIKE %, Urine     Not Observed % Not Observed  NOTE:      Comment     RADIOGRAPHIC STUDIES: I have personally reviewed the radiological images as listed and agreed with the findings in the report. Dg Hips Bilat With Pelvis 3-4 Views  Result Date: 07/27/2019 CLINICAL DATA:  Hip pain EXAM: DG HIP (WITH OR WITHOUT PELVIS) 3-4V BILAT COMPARISON:  None. FINDINGS: No fracture or malalignment. Minimal degenerative  change of the bilateral hips. Pubic symphysis and rami are intact. Vascular calcifications. IMPRESSION: Minimal degenerative change without acute osseous abnormality. Prominent vascular calcification Electronically Signed   By: KDonavan FoilM.D.   On: 07/27/2019 17:34    ASSESSMENT & PLAN:   #1 CKD with Nephrotic syndrome- likely from longstanding DM2 -hx of diabetes for 10+ yrs -no M protein spike -06/05/2019 K:L ratio at 2.85 Elevated light  chains and abnormal ratio - likely due to decrease renal excretion of light chains  #2 hx of Hepatitis C in 2017 -treated by Dr. Scharlene Gloss  PLAN: A&P: -Discussed pt labwork today, 07/18/19; all values are WNL except for Hgb at 11.5, HCT at 35.1, Glucose at 141, Creatinine at 1.31, Albumin at 3.4, GFR Est AFR Am at 51. -Discussed 07/18/2019 MMP is all values are WNL -Discussed 07/18/2019 K/L light chains shows Kappa free light chain at 59.9, Lamda free light chains at 22.7, Kappa lambda light chain ratio at 2.64.  -Discussed 07/18/2019 Sed rate is 39 -Discussed 07/18/2019 LDH is 157 -Discussed 07/21/2019 24-Hr Ur UPEP/UIFE/Light Chains/TP all values are WNL except for Total Protein, Urine-UR/day at 3602, Free Kappa Lt Chains Ur at 166.90, Free Lambda Lt Chains Ur at 15.43 -No evidence of myeloma at this time  -Pt does not fit the CRAB criteria. No significant anemia, no hypercalcemia, CKD with nephrotic syndrome (likely due to other causes - Dm2, ?Hep C) & no sypmtoms of bone lesions. -AL Amlyoidosis is not likely at this time but cannot be ruled out based on this workup -No indications for whole body scan or BM Bx at this time. -Pt will need to f/u with Nephrologist to determine if renal dysfunction is caused exclusively by diabetes or if there is an indication for renal biopsy to r/o other etiologies for her renal dysfunction -Will see back as needed  FOLLOW UP: Rtc with Dr Irene Limbo as needed  No orders of the defined types were placed in this  encounter.  The total time spent in the appt was 20 minutes and more than 50% was on counseling and direct patient cares.  All of the patient's questions were answered with apparent satisfaction. The patient knows to call the clinic with any problems, questions or concerns.   Sullivan Lone MD [place] AAHIVMS [facility] Hematology/Oncology Physician [facility]  (Office):       [phone] (Work cell):  [phone] (Fax):           [phone]  08/08/2019 3:42 PM  I, Yevette Edwards, am acting as a scribe for Dr. Sullivan Lone.   .I have reviewed the above documentation for accuracy and completeness, and I agree with the above. Brunetta Genera MD

## 2019-08-08 ENCOUNTER — Inpatient Hospital Stay: Payer: Medicare Other | Attending: Hematology | Admitting: Hematology

## 2019-08-08 ENCOUNTER — Other Ambulatory Visit: Payer: Self-pay

## 2019-08-08 ENCOUNTER — Telehealth: Payer: Self-pay | Admitting: Hematology

## 2019-08-08 VITALS — BP 181/103 | HR 69 | Temp 98.5°F | Resp 18 | Ht <= 58 in | Wt 173.9 lb

## 2019-08-08 DIAGNOSIS — N189 Chronic kidney disease, unspecified: Secondary | ICD-10-CM | POA: Insufficient documentation

## 2019-08-08 DIAGNOSIS — K5900 Constipation, unspecified: Secondary | ICD-10-CM | POA: Insufficient documentation

## 2019-08-08 DIAGNOSIS — R768 Other specified abnormal immunological findings in serum: Secondary | ICD-10-CM

## 2019-08-08 DIAGNOSIS — R779 Abnormality of plasma protein, unspecified: Secondary | ICD-10-CM | POA: Diagnosis not present

## 2019-08-08 DIAGNOSIS — B182 Chronic viral hepatitis C: Secondary | ICD-10-CM | POA: Insufficient documentation

## 2019-08-08 DIAGNOSIS — N049 Nephrotic syndrome with unspecified morphologic changes: Secondary | ICD-10-CM | POA: Insufficient documentation

## 2019-08-08 NOTE — Telephone Encounter (Signed)
Per 9/1 los return to MD as needed.

## 2019-08-12 ENCOUNTER — Other Ambulatory Visit: Payer: Self-pay | Admitting: Gastroenterology

## 2019-08-17 ENCOUNTER — Ambulatory Visit: Payer: Medicare Other | Admitting: Radiation Oncology

## 2019-08-17 ENCOUNTER — Other Ambulatory Visit: Payer: Self-pay

## 2019-08-18 ENCOUNTER — Ambulatory Visit: Payer: Medicare Other | Admitting: Radiation Oncology

## 2019-08-21 ENCOUNTER — Other Ambulatory Visit: Payer: Self-pay | Admitting: Gastroenterology

## 2019-08-23 ENCOUNTER — Other Ambulatory Visit: Payer: Self-pay | Admitting: Gastroenterology

## 2019-08-24 ENCOUNTER — Other Ambulatory Visit: Payer: Self-pay | Admitting: Gastroenterology

## 2019-08-28 ENCOUNTER — Ambulatory Visit (INDEPENDENT_AMBULATORY_CARE_PROVIDER_SITE_OTHER): Payer: Medicare Other | Admitting: Radiation Oncology

## 2019-08-28 ENCOUNTER — Other Ambulatory Visit: Payer: Self-pay

## 2019-08-28 ENCOUNTER — Encounter: Payer: Self-pay | Admitting: Radiation Oncology

## 2019-08-28 DIAGNOSIS — Z0000 Encounter for general adult medical examination without abnormal findings: Secondary | ICD-10-CM

## 2019-08-28 NOTE — Progress Notes (Signed)
This AWV is being conducted by [place] only. The patient was located at home and I was located in [facility]. The patient's identity was confirmed using their DOB and current address. The patient or his/her legal guardian has consented to being evaluated through a telephone encounter and understands the associated risks (an examination cannot be done and the patient may need to come in for an appointment) / benefits (allows the patient to remain at home, decreasing exposure to coronavirus). I personally spent 37 minutes conducting the AWV.  Subjective:   Nancy [name] is a 63 y.o. female who presents for a Medicare Annual Wellness Visit.  The following items have been reviewed and updated today in the appropriate area in the EMR.   Health Risk Assessment  Height, weight, BMI, and BP Visual acuity if needed Depression screen Fall risk / safety level Advance directive discussion Medical and family history were reviewed and updated Updating list of other providers & suppliers Medication reconciliation, including over the counter medicines Cognitive screen Written screening schedule Risk Factor list Personalized health advice, risky behaviors, and treatment advice  Social History   Social History Narrative   Current Social History 08/28/2019        Patient lives alone in a 7th floor apartment with elevator. There are not steps up to the entrance the patient uses.       Patient's method of transportation is via family member (mom or friend).      The highest level of education was high school diploma.      The patient currently disabled.      Identified important Relationships are "My mother"       Pets : None       Interests / Fun: "TV, movies"       Current Stressors: "People, my health"       Religious / Personal Beliefs: "Baptist"       L. Kathline Banbury, RN, BSN             Objective:    Vitals: LMP 02/15/2009  Vitals are unable to obtained due to [id number]  public health emergency  Activities of Daily Living In your present state of health, do you have any difficulty performing the following activities: 08/28/2019 07/27/2019  Hearing? N N  Vision? Y N  Comment Only when blood sugar elevated -  Difficulty concentrating or making decisions? N N  Walking or climbing stairs? Y Y  Comment - leg pain  Dressing or bathing? N N  Doing errands, shopping? N Y  Comment - gets transportation  Some recent data might be hidden    Goals Goals    . Blood Pressure < 130/80    . Exercise 2-3x per week (20-30 min per time) (pt-stated)     Seated and standing exercises with red exercise band    . HEMOGLOBIN A1C < 7.0    . LDL CALC < 100       Fall Risk Fall Risk  08/28/2019 07/27/2019 10/11/2018 09/06/2018 08/30/2018  Falls in the past year? 0 0 0 No No  Injury with Fall? - - - - -  Risk for fall due to : - - - - -  Follow up Education provided;Falls prevention discussed Falls prevention discussed - - -   CDC Handout on Fall Prevention and Handout on Home Exercise Program, Access codes [email or web address] and [id number] mailed to patient with exercise band.    Depression Screen PHQ 2/9 Scores 08/28/2019 07/27/2019 09/06/2018 08/30/2018  PHQ - 2 Score 0 0 0 0  PHQ- 9 Score 6 - 3 -     Cognitive Testing Six-Item Cognitive Screener   "I would like to ask you some questions that ask you to use your memory. I am going to name three objects. Please wait until I say all three words, then repeat them. Remember what they are  because I am going to ask you to name them again in a few minutes. Please repeat these words for me: APPLE-TABLE-PENNY." (Interviewer may repeat names 3 times if necessary but repetition not scored.)  Did patient correctly repeat all three words? Yes - may proceed with screen  What year is this? Correct What month is this? Correct What day of the week is this? Correct  What were the three objects I asked you to remember? . Apple Correct . Table  Correct . Kieth Brightly unable to state  Score one point for each incorrect answer.  A score of 2 or more points warrants additional investigation.  Patient's score 1   Assessment and Plan:     Appt scheduled 08/29/2019 at 3:30 to meet new PCP and discuss "stomach issues, decreased appetite." Check A1c and Cholesterol at next office visit Patient will consider meeting with Miquel Dunn for counseling (PHQ-9 = 6) Patient will consider meeting with Debera Lat for help with healthy food/drink choices. She will obtain Flu vaccine at next visit or at local Pharmacy She will begin seated and standing exercises with exercise band 2-3 days per week, 20-30 minutes each time.   During the course of the visit the patient was educated and counseled about appropriate screening and preventive services as documented in the assessment and plan.  The printed AVS was given to the patient and included an updated screening schedule, a list of risk factors, and personalized health advice.        Velora Heckler, RN  08/28/2019

## 2019-08-28 NOTE — Patient Instructions (Addendum)
Annual Wellness Visit   Medicare Covered Preventative Screenings and Services  Services & Screenings Men and Women Who How Often Need? Date of Last Service Action  Abdominal Aortic Aneurysm Adults with AAA risk factors Once     Alcohol Misuse and Counseling All Adults Screening once a year if no alcohol misuse. Counseling up to 4 face to face sessions.     Bone Density Measurement  Adults at risk for osteoporosis Once every 2 yrs     Lipid Panel Z13.6 All adults without CV disease Once every 5 yrs Yes  Cholesterol check at next visit  Colorectal Cancer   Stool sample or  Colonoscopy All adults 55 and older   Once every year  Every 10 years  04/02/2014   Depression All Adults Once a year Yes Today Consider meeting with Miquel Dunn for counseling  Diabetes Screening Blood glucose, post glucose load, or GTT Z13.1  All adults at risk  Pre-diabetics  Once per year  Twice per year Yes  A1c check at next visit  Diabetes  Self-Management Training All adults Diabetics 10 hrs first year; 2 hours subsequent years. Requires Copay     Glaucoma  Diabetics  Family history of glaucoma  African Americans 30 yrs +  Hispanic Americans 14 yrs + Annually - requires coppay     Hepatitis C Z72.89 or F19.20  High Risk for HCV  Born between 1945 and 1965  Annually  Once     HIV Z11.4 All adults based on risk  Annually btw ages 34 & 20 regardless of risk  Annually > 65 yrs if at increased risk  03/06/2015 Normal  Lung Cancer Screening Asymptomatic adults aged 9-77 with 30 pack yr history and current smoker OR quit within the last 15 yrs Annually Must have counseling and shared decision making documentation before first screen     Medical Nutrition Therapy Adults with   Diabetes  Renal disease  Kidney transplant within past 3 yrs 3 hours first year; 2 hours subsequent years   Consider meeting with Debera Lat, our Diabetic Educator, for help with healthy food/drink choices.  Obesity and  Counseling All adults Screening once a year Counseling if BMI 30 or higher  Today   Tobacco Use Counseling Adults who use tobacco  Up to 8 visits in one year     Vaccines Z23  Hepatitis B  Influenza   Pneumonia  Adults   Once  Once every flu season  Two different vaccines separated by one year Yes  Flu vaccine at next visit or at [place] with Medicare Every year  Today     [place] Women Who How Often Need  Date of Last Service Action  Mammogram  Z12.31 Women over 105 One baseline ages 32-39. Annually ager 87 yrs+  07/18/2019   Pap tests All women Annually if high risk. Every 2 yrs for normal risk women     Screening for cervical cancer with   Pap (Z01.419 nl or Z01.411abnl) &  HPV Z11.51 Women aged 66 to 55 Once every 5 yrs  07/16/2018   Screening pelvic and breast exams All women Annually if high risk. Every 2 yrs for normal risk women     Sexually Transmitted Diseases  Chlamydia  Gonorrhea  Syphilis All at risk adults Annually for non pregnant females at increased risk         [place] Men Who How Ofter Need  Date of  Last Service Action  Prostate Cancer - DRE & PSA Men over 50 Annually.  DRE might require a copay.     Sexually Transmitted Diseases  Syphilis All at risk adults Annually for men at increased risk         Things That May Be Affecting Your Health:  Alcohol  Hearing loss  Pain    Depression  Home Safety  Sexual Health   Diabetes X Lack of physical activity  Stress   Difficulty with daily activities  Loneliness  Tiredness   Drug use  Medicines  Tobacco use   Falls  Motor Vehicle Safety X Weight  X Food choices  Oral Health  Other    YOUR PERSONALIZED HEALTH PLAN : 1. Schedule your next subsequent Medicare Wellness visit in one year 2. Attend all of your regular appointments to address your medical issues 3. Complete the preventative screenings and services 4. We will check  your A1c and Cholesterol at next office visit 5. Consider meeting with Miquel Dunn for counseling 6. Consider meeting with Debera Lat, our Diabetic Educator, for help with healthy food/drink choices. 7. Flu vaccine at next visit or at local Pharmacy 8. Begin seated and standing exercises with exercise band 2-3 days per week, 20-30 minutes each time. Congratulations on setting this health goal!!   Fall Prevention in the Home, Adult Falls can cause injuries. They can happen to people of all ages. There are many things you can do to make your home safe and to help prevent falls. Ask for help when making these changes, if needed. What actions can I take to prevent falls? General Instructions  Use good lighting in all rooms. Replace any light bulbs that burn out.  Turn on the lights when you go into a dark area. Use night-lights.  Keep items that you use often in easy-to-reach places. Lower the shelves around your home if necessary.  Set up your furniture so you have a clear path. Avoid moving your furniture around.  Do not have throw rugs and other things on the floor that can make you trip.  Avoid walking on wet floors.  If any of your floors are uneven, fix them.  Add color or contrast paint or tape to clearly mark and help you see: ? Any grab bars or handrails. ? First and last steps of stairways. ? Where the edge of each step is.  If you use a stepladder: ? Make sure that it is fully opened. Do not climb a closed stepladder. ? Make sure that both sides of the stepladder are locked into place. ? Ask someone to hold the stepladder for you while you use it.  If there are any pets around you, be aware of where they are. What can I do in the bathroom?      Keep the floor dry. Clean up any water that spills onto the floor as soon as it happens.  Remove soap buildup in the tub or shower regularly.  Use non-skid mats or decals on the floor of the tub or shower.  Attach bath mats  securely with double-sided, non-slip rug tape.  If you need to sit down in the shower, use a plastic, non-slip stool.  Install grab bars by the toilet and in the tub and shower. Do not use towel bars as grab bars. What can I do in the bedroom?  Make sure that you have a light by your bed that is easy to reach.  Do not use any  sheets or blankets that are too big for your bed. They should not hang down onto the floor.  Have a firm chair that has side arms. You can use this for support while you get dressed. What can I do in the kitchen?  Clean up any spills right away.  If you need to reach something above you, use a strong step stool that has a grab bar.  Keep electrical cords out of the way.  Do not use floor polish or wax that makes floors slippery. If you must use wax, use non-skid floor wax. What can I do with my stairs?  Do not leave any items on the stairs.  Make sure that you have a light switch at the top of the stairs and the bottom of the stairs. If you do not have them, ask someone to add them for you.  Make sure that there are handrails on both sides of the stairs, and use them. Fix handrails that are broken or loose. Make sure that handrails are as long as the stairways.  Install non-slip stair treads on all stairs in your home.  Avoid having throw rugs at the top or bottom of the stairs. If you do have throw rugs, attach them to the floor with carpet tape.  Choose a carpet that does not hide the edge of the steps on the stairway.  Check any carpeting to make sure that it is firmly attached to the stairs. Fix any carpet that is loose or worn. What can I do on the outside of my home?  Use bright outdoor lighting.  Regularly fix the edges of walkways and driveways and fix any cracks.  Remove anything that might make you trip as you walk through a door, such as a raised step or threshold.  Trim any bushes or trees on the path to your home.  Regularly check to  see if handrails are loose or broken. Make sure that both sides of any steps have handrails.  Install guardrails along the edges of any raised decks and porches.  Clear walking paths of anything that might make someone trip, such as tools or rocks.  Have any leaves, snow, or ice cleared regularly.  Use sand or salt on walking paths during winter.  Clean up any spills in your garage right away. This includes grease or oil spills. What other actions can I take?  Wear shoes that: ? Have a low heel. Do not wear high heels. ? Have rubber bottoms. ? Are comfortable and fit you well. ? Are closed at the toe. Do not wear open-toe sandals.  Use tools that help you move around (mobility aids) if they are needed. These include: ? Canes. ? Walkers. ? Scooters. ? Crutches.  Review your medicines with your doctor. Some medicines can make you feel dizzy. This can increase your chance of falling. Ask your doctor what other things you can do to help prevent falls. Where to find more information  Centers for Disease Control and Prevention, STEADI: https://garcia.biz/  [employer] on Aging: BrainJudge.co.uk Contact a doctor if:  You are afraid of falling at home.  You feel weak, drowsy, or dizzy at home.  You fall at home. Summary  There are many simple things that you can do to make your home safe and to help prevent falls.  Ways to make your home safe include removing tripping hazards and installing grab bars in the bathroom.  Ask for help when making these changes in your  home. This information is not intended to replace advice given to you by your health care provider. Make sure you discuss any questions you have with your health care provider. Document Released: 09/19/2009 Document Revised: 03/16/2019 Document Reviewed: 07/08/2017 Elsevier Patient Education  2020 [place] Maintenance, Female Adopting a healthy lifestyle and getting preventive care  are important in promoting health and wellness. Ask your health care provider about:  The right schedule for you to have regular tests and exams.  Things you can do on your own to prevent diseases and keep yourself healthy. What should I know about diet, weight, and exercise? Eat a healthy diet   Eat a diet that includes plenty of vegetables, fruits, low-fat dairy products, and lean protein.  Do not eat a lot of foods that are high in solid fats, added sugars, or sodium. Maintain a healthy weight Body mass index (BMI) is used to identify weight problems. It estimates body fat based on height and weight. Your health care provider can help determine your BMI and help you achieve or maintain a healthy weight. Get regular exercise Get regular exercise. This is one of the most important things you can do for your health. Most adults should:  Exercise for at least 150 minutes each week. The exercise should increase your heart rate and make you sweat (moderate-intensity exercise).  Do strengthening exercises at least twice a week. This is in addition to the moderate-intensity exercise.  Spend less time sitting. Even light physical activity can be beneficial. Watch cholesterol and blood lipids Have your blood tested for lipids and cholesterol at 62 years of age, then have this test every 5 years. Have your cholesterol levels checked more often if:  Your lipid or cholesterol levels are high.  You are older than 62 years of age.  You are at high risk for heart disease. What should I know about cancer screening? Depending on your health history and family history, you may need to have cancer screening at various ages. This may include screening for:  Breast cancer.  Cervical cancer.  Colorectal cancer.  Skin cancer.  Lung cancer. What should I know about heart disease, diabetes, and high blood pressure? Blood pressure and heart disease  High blood pressure causes heart disease and  increases the risk of stroke. This is more likely to develop in people who have high blood pressure readings, are of African descent, or are overweight.  Have your blood pressure checked: ? Every 3-5 years if you are 61-50 years of age. ? Every year if you are 3 years old or older. Diabetes Have regular diabetes screenings. This checks your fasting blood sugar level. Have the screening done:  Once every three years after age 33 if you are at a normal weight and have a low risk for diabetes.  More often and at a younger age if you are overweight or have a high risk for diabetes. What should I know about preventing infection? Hepatitis B If you have a higher risk for hepatitis B, you should be screened for this virus. Talk with your health care provider to find out if you are at risk for hepatitis B infection. Hepatitis C Testing is recommended for:  Everyone born from [birth year] through 1965.  Anyone with known risk factors for hepatitis C. Sexually transmitted infections (STIs)  Get screened for STIs, including gonorrhea and chlamydia, if: ? You are sexually active and are younger than 62 years of age. ? You are older  than 62 years of age and your health care provider tells you that you are at risk for this type of infection. ? Your sexual activity has changed since you were last screened, and you are at increased risk for chlamydia or gonorrhea. Ask your health care provider if you are at risk.  Ask your health care provider about whether you are at high risk for HIV. Your health care provider may recommend a prescription medicine to help prevent HIV infection. If you choose to take medicine to prevent HIV, you should first get tested for HIV. You should then be tested every 3 months for as long as you are taking the medicine. Pregnancy  If you are about to stop having your period (premenopausal) and you may become pregnant, seek counseling before you get pregnant.  Take 400 to 800  micrograms (mcg) of folic acid every day if you become pregnant.  Ask for birth control (contraception) if you want to prevent pregnancy. Osteoporosis and menopause Osteoporosis is a disease in which the bones lose minerals and strength with aging. This can result in bone fractures. If you are 17 years old or older, or if you are at risk for osteoporosis and fractures, ask your health care provider if you should:  Be screened for bone loss.  Take a calcium or vitamin D supplement to lower your risk of fractures.  Be given hormone replacement therapy (HRT) to treat symptoms of menopause. Follow these instructions at home: Lifestyle  Do not use any products that contain nicotine or tobacco, such as cigarettes, e-cigarettes, and chewing tobacco. If you need help quitting, ask your health care provider.  Do not use street drugs.  Do not share needles.  Ask your health care provider for help if you need support or information about quitting drugs. Alcohol use  Do not drink alcohol if: ? Your health care provider tells you not to drink. ? You are pregnant, may be pregnant, or are planning to become pregnant.  If you drink alcohol: ? Limit how much you use to 0-1 drink a day. ? Limit intake if you are breastfeeding.  Be aware of how much alcohol is in your drink. In the [place] one drink equals one 12 oz bottle of beer (355 mL), one 5 oz glass of wine (148 mL), or one 1 oz glass of hard liquor (44 mL). General instructions  Schedule regular health, dental, and eye exams.  Stay current with your vaccines.  Tell your health care provider if: ? You often feel depressed. ? You have ever been abused or do not feel safe at home. Summary  Adopting a healthy lifestyle and getting preventive care are important in promoting health and wellness.  Follow your health care provider's instructions about healthy diet, exercising, and getting tested or screened for diseases.  Follow your health  care provider's instructions on monitoring your cholesterol and blood pressure. This information is not intended to replace advice given to you by your health care provider. Make sure you discuss any questions you have with your health care provider. Document Released: 06/08/2011 Document Revised: 11/16/2018 Document Reviewed: 11/16/2018 Elsevier Patient Education  2020 [employer].

## 2019-08-29 ENCOUNTER — Encounter: Payer: Medicare Other | Admitting: Radiation Oncology

## 2019-08-29 NOTE — Progress Notes (Signed)
I discussed the AWV findings with the RN who conducted the visit. I was present in the office suite and immediately available to provide assistance and direction throughout the time the service was provided.   

## 2019-08-30 NOTE — Progress Notes (Signed)
[facility] Medicine Clinic Attending  I have reviewed the AWV findings.  I agree with the assessment, diagnosis, and plan of care documented in the AWV note.

## 2019-09-01 ENCOUNTER — Ambulatory Visit (INDEPENDENT_AMBULATORY_CARE_PROVIDER_SITE_OTHER): Payer: Medicare Other | Admitting: Family Medicine

## 2019-09-01 ENCOUNTER — Other Ambulatory Visit: Payer: Self-pay

## 2019-09-01 VITALS — BP 132/84 | Ht <= 58 in | Wt 165.0 lb

## 2019-09-01 DIAGNOSIS — M25531 Pain in right wrist: Secondary | ICD-10-CM

## 2019-09-01 DIAGNOSIS — M25532 Pain in left wrist: Secondary | ICD-10-CM

## 2019-09-01 MED ORDER — GABAPENTIN 100 MG PO CAPS: 100.0000 mg | ORAL_CAPSULE | Freq: Every day | ORAL | 3 refills | Status: DC

## 2019-09-01 NOTE — Patient Instructions (Signed)
We think that your wrist pain may be more than carpal tunnel syndrome. We are going to refer you to a hand surgeon for further evaluation. 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  We are starting a new medicine to help with nerve pain- its called gabapentin. Take 100 mg at night. This can make you sleepy. Return in 1 month.

## 2019-09-01 NOTE — Progress Notes (Signed)
PCP: Al Decant, MD  Subjective:   HPI: Patient is a 63 y.o. female here for follow up of left wrist pain. She was seen on 8/28 and had a carpel tunnel injection that provided relief for 1 week. The pain returned ans is worse than it was before.    8/28 She has known diagnoses of bilateral carpal tunnel syndrome.  She endorses injections into both wrists in the past which have not helped her pain.  Couple years ago she had carpal tunnel release on her right arm.  She notes this provided very minimal relief from her symptoms.  The pain starts in her wrist and radiates down into her first 3 fingers in both hands.  She does have associated numbness and tingling.  She also endorses swelling of bilateral hands.  She has no overlying skin changes.  She notes night splints of not helped her symptoms at all.  Over-the-counter medications including Tylenol and ibuprofen have not helped her symptoms at all.   Review of Systems: See HPI above.  Past Medical History:  Diagnosis Date  . Carpal tunnel syndrome, bilateral    confirmed on nerve conduction studies  . Chronic hepatitis C ([place])    considered cured 06/2016 post retroviral therapy   . Diabetes mellitus type 2, uncontrolled ([place])   . Diabetic peripheral neuropathy ([place])   . GERD (gastroesophageal reflux disease)   . History of endometriosis   . History of Helicobacter infection 11/07  . Hyperlipidemia   . Liver fibrosis 02/17/2016   F2/3    Past surgical history reviewed. Family history reviewed Allergies reviewed    Objective:  Physical Exam: LMP 02/15/2009  Gen: NAD, comfortable in exam room Lungs: Breathing comfortably on room air  Hand/wrist Exam Right -Inspection: No deformity, no discoloration -Palpation: Tenderness palpation along the anterior aspect of the wrist -ROM: Flexion: full ROM, painful  Strength: normal  -Special Tests: Tinels: Positive; Phalens: Positive -Limb neurovascularly intact  Left -Inspection:  No deformity, no discoloration -Palpation: Tenderness to palpation along the dorsal aspect of the wrist. Small nodule appreciated in mid forearm on distal surface, TTP. -ROM: Full ROM- pain with both flexion and extension. -Strength: Flexion: 5/5; Extension: 5/5; Radial Deviation: 5/5; Ulnar Deviation: 5/5 -Special Tests: Tinels: Positive; Phalens: Positive -Limb neurovascularly intact     Assessment & Plan:  Patient is a 63 y.o. female here for bilateral wrist pain, left greater than right.  Although she has diagnosis of carpel tunnel release, she has had minimal relief with steroid injection and she has pain on exam with both flexion and extension of wrist indicative of pain from possible arthritis more so than carpel tunnel (although she does have symptoms of this as well). Will refer to hand surgeon given arthritic component. Also provided gabepentin 100 mg nightly- will follow up in 1 month given current GFR stage 3a.

## 2019-09-01 NOTE — Progress Notes (Signed)
[facility]: Attending Note: I have reviewed the chart, discussed wit the Sports Medicine Fellow. I agree with assessment and treatment plan as detailed in the [place] note. #1.  Bilateral wrist pain.  This seems atypical with carpal tunnel syndrome.  Her left wrist is much worse than the right and she has some distal forearm swelling.  I reviewed her x-rays from 3 years ago and there was mild arthritis in the wrist joint.  I wonder if this is significantly increased.  I would like her to see a Copy.  I do not really think her issue is carpal tunnel syndrome.  She is already had carpal tunnel release on one side and the injection I did in her left wrist at last office visit which is the nonintervention side had little to no improvement.  We will send her to hand surgeon.  I would like her to follow-up here after that unless they determine definitive therapy.

## 2019-09-06 ENCOUNTER — Other Ambulatory Visit: Payer: Self-pay | Admitting: *Deleted

## 2019-09-07 NOTE — Addendum Note (Signed)
Addended by: Jolinda Croak E on: 09/07/2019 12:20 PM   Modules accepted: Orders

## 2019-09-11 MED ORDER — INSULIN GLARGINE 100 UNIT/ML ~~LOC~~ SOLN: SUBCUTANEOUS | 0 refills | Status: DC

## 2019-09-12 ENCOUNTER — Other Ambulatory Visit: Payer: Self-pay

## 2019-09-12 ENCOUNTER — Ambulatory Visit (INDEPENDENT_AMBULATORY_CARE_PROVIDER_SITE_OTHER): Payer: Medicare Other | Admitting: Radiation Oncology

## 2019-09-12 VITALS — BP 174/97 | HR 70 | Wt 176.9 lb

## 2019-09-12 DIAGNOSIS — I10 Essential (primary) hypertension: Secondary | ICD-10-CM

## 2019-09-12 DIAGNOSIS — E785 Hyperlipidemia, unspecified: Secondary | ICD-10-CM | POA: Diagnosis not present

## 2019-09-12 DIAGNOSIS — K5909 Other constipation: Secondary | ICD-10-CM

## 2019-09-12 DIAGNOSIS — E1159 Type 2 diabetes mellitus with other circulatory complications: Secondary | ICD-10-CM

## 2019-09-12 DIAGNOSIS — Z23 Encounter for immunization: Secondary | ICD-10-CM

## 2019-09-12 DIAGNOSIS — Z794 Long term (current) use of insulin: Secondary | ICD-10-CM | POA: Diagnosis not present

## 2019-09-12 DIAGNOSIS — R1084 Generalized abdominal pain: Secondary | ICD-10-CM

## 2019-09-12 DIAGNOSIS — G8929 Other chronic pain: Secondary | ICD-10-CM

## 2019-09-12 DIAGNOSIS — E11319 Type 2 diabetes mellitus with unspecified diabetic retinopathy without macular edema: Secondary | ICD-10-CM

## 2019-09-12 DIAGNOSIS — E1169 Type 2 diabetes mellitus with other specified complication: Secondary | ICD-10-CM

## 2019-09-12 DIAGNOSIS — Z79899 Other long term (current) drug therapy: Secondary | ICD-10-CM

## 2019-09-12 DIAGNOSIS — G5603 Carpal tunnel syndrome, bilateral upper limbs: Secondary | ICD-10-CM

## 2019-09-12 DIAGNOSIS — R110 Nausea: Secondary | ICD-10-CM

## 2019-09-12 DIAGNOSIS — Z6836 Body mass index (BMI) 36.0-36.9, adult: Secondary | ICD-10-CM

## 2019-09-12 DIAGNOSIS — Z0000 Encounter for general adult medical examination without abnormal findings: Secondary | ICD-10-CM

## 2019-09-12 DIAGNOSIS — K5900 Constipation, unspecified: Secondary | ICD-10-CM

## 2019-09-12 LAB — POCT GLYCOSYLATED HEMOGLOBIN (HGB A1C): Hemoglobin A1C: 6.7 % — AB (ref 4.0–5.6)

## 2019-09-12 LAB — GLUCOSE, CAPILLARY: Glucose-Capillary: 151 mg/dL — ABNORMAL HIGH (ref 70–99)

## 2019-09-12 MED ORDER — INSULIN GLARGINE 100 UNIT/ML ~~LOC~~ SOLN: SUBCUTANEOUS | 0 refills | Status: DC

## 2019-09-12 NOTE — Patient Instructions (Addendum)
Thank you for coming to your appointment. It was so nice to see you. Today we discussed  Diagnoses and all orders for this visit:  Hypertension associated with diabetes ([place])      -      BP was elevated to 178/102      -      recheck was 174/97      -      Please use the blood pressure monitor we are providing you and check your blood pressure everyday; please record the results on the log we are providing you; you will follow up with a nurse over the phone to discuss you results  Controlled type 2 diabetes mellitus with retinopathy, with long-term current use of insulin, macular edema presence unspecified, unspecified laterality, unspecified retinopathy severity ([place]) -     POC Hbg A1C: 6.7, keep up the good work!!! -     Try and walk 2-3 times per week with your mom   Hyperlipidemia associated with type 2 diabetes mellitus ([place]) -     We will check your lipids today; we will call with any abnormal results; otherwise continue your Lipitor!  Health care maintenance       -     Flu shot today!  Constipation      -      Start using 1 cap or packet of Miralax per day in a full glass of water      -      Drink at least 64 oz of water or 8 bottles of water per day            -      Call if that hasn't increased your bowel movements  If labs were drawn today, I will call you with any abnormal results. Otherwise, please keep up the good work. I look forward to seeing you again soon at your follow up in 3 months.  Sincerely,  Al Decant, MD

## 2019-09-12 NOTE — Assessment & Plan Note (Signed)
-  patient reports her diet and exercise regimen have not been going well recently due to the pandemic and the gym closing -she is going to work on walking more frequently -does not wish to meet with the diabetes counselor at this time -follow up in 3 months

## 2019-09-12 NOTE — Assessment & Plan Note (Signed)
-   flu shot today

## 2019-09-12 NOTE — Assessment & Plan Note (Addendum)
-  pt w/ previously well controlled blood pressure who was taken off spironolactone 50 mg a few months ago by her kidney doctors, now only on olmesartan 40 mg with recent well controlled BP of 130/80 with very elevated BP here to 178/102, recheck of 174/97 -patient does not check her bp at home as she does not have a cuff so she was provided one in clinic with instructions on how to use it and paper on which to record her pressure -she was instructed to measure and record her blood pressure daily and to follow up with a nurse via telephone in 2 weeks and if pressure is regularly >130/80, we will add amlodipine 5 mg with follow up appointment one month after with BMP; if pressures are regularly <130/80, we will follow up in clinic one month after for additional BP check in the office

## 2019-09-12 NOTE — Assessment & Plan Note (Signed)
-  on atorvastatin 40 mg, tolerating without side effects -lipid panel today

## 2019-09-12 NOTE — Assessment & Plan Note (Addendum)
-  pt w/ hx of constipation on daily senokot here with complaints of generalized abdominal pain after heavy meals with associated nausea and bloating with slight tenderness to palpation diffusely on exam  -pt reports 2-4 BM/week and limited water consumption which are likely contributing to her abdominal pain -have recommended drinking 64 oz of water today with addition of one capful/one packet of miralax daily for more regular BMs

## 2019-09-12 NOTE — Assessment & Plan Note (Addendum)
-  pt w/ known controlled diabetes on victoza 1.2 mg and lantus 32 units -Hgb a1c of 6.7 today -patient reports her diet and exercise regimen have not been going well recently due to the pandemic and the gym closing -she is going to work on walking more frequently -does not wish to meet with the diabetes counselor at this time -follow up in 3 months with Hgb a1c check

## 2019-09-12 NOTE — Progress Notes (Signed)
   CC: diabetes   HPI:  Nancy [name] is a 63 y.o. is here for a follow up of her chronic medical conditions.   Past Medical History:  Diagnosis Date  . Carpal tunnel syndrome, bilateral    confirmed on nerve conduction studies  . Chronic hepatitis C ([place])    considered cured 06/2016 post retroviral therapy   . Diabetes mellitus type 2, uncontrolled ([place])   . Diabetic peripheral neuropathy ([place])   . GERD (gastroesophageal reflux disease)   . History of endometriosis   . History of Helicobacter infection 11/07  . Hyperlipidemia   . Liver fibrosis 02/17/2016   F2/3    Review of Systems:    Review of Systems  Constitutional: Negative for chills, fever and weight loss (10 lb weight gain).  HENT: Negative for congestion and sore throat.   Respiratory: Negative for cough and shortness of breath.   Cardiovascular: Negative for chest pain and leg swelling.  Gastrointestinal: Positive for abdominal pain (generalized pain after eating heavy meals associated with bloating), constipation (2-4 BM/week with daily senokot ) and nausea (occasionally w/ abdominal pain).  Genitourinary: Negative.  Negative for dysuria.  Musculoskeletal:       Chronic pain from carpal tunnel, bilateral  Skin: Negative for rash.  Neurological: Negative for loss of consciousness.  Psychiatric/Behavioral: Negative for depression. The patient is not nervous/anxious (some stress but no anxiety).    Physical Exam:  Vitals:   09/12/19 0921 09/12/19 0958  BP: (!) 178/102 (!) 174/97  Pulse: 72 70  SpO2: 100%   Weight: 176 lb 14.4 oz (80.2 kg)    Physical Exam  Constitutional: She is oriented to person, place, and time and well-developed, well-nourished, and in no distress. No distress.  Neck: Normal range of motion.  Cardiovascular: Normal rate, regular rhythm, normal heart sounds and intact distal pulses.  No murmur heard. No lower extremity edema  Pulmonary/Chest: Effort normal and breath sounds normal.  No respiratory distress.  Abdominal: Soft. Bowel sounds are normal. She exhibits no distension. There is abdominal tenderness (slight tenderness to palpation throughout).  Musculoskeletal: Normal range of motion.        General: No deformity or edema.  Neurological: She is alert and oriented to person, place, and time.  Skin: Skin is warm and dry. She is not diaphoretic.  Psychiatric: Affect normal.  Nursing note and vitals reviewed.  Assessment & Plan:   See Encounters Tab for problem based charting.  Patient seen with Dr. Rebeca Alert

## 2019-09-12 NOTE — Progress Notes (Signed)
Internal Medicine Clinic Attending  I saw and evaluated the patient.  I personally confirmed the key portions of the history and exam documented by Dr. Madilyn Fireman and I reviewed pertinent patient test results.  The assessment, diagnosis, and plan were formulated together and I agree with the documentation in the resident's note.  Lenice Pressman, M.D., Ph.D.

## 2019-09-13 LAB — LIPID PANEL
Chol/HDL Ratio: 2.7 ratio (ref 0.0–4.4)
Cholesterol, Total: 140 mg/dL (ref 100–199)
HDL: 51 mg/dL (ref >39)
LDL Chol Calc (NIH): 67 mg/dL (ref 0–99)
Triglycerides: 121 mg/dL (ref 0–149)
VLDL Cholesterol Cal: 22 mg/dL (ref 5–40)

## 2019-09-15 DIAGNOSIS — M79642 Pain in left hand: Secondary | ICD-10-CM | POA: Diagnosis not present

## 2019-09-15 DIAGNOSIS — M79641 Pain in right hand: Secondary | ICD-10-CM | POA: Diagnosis not present

## 2019-09-15 DIAGNOSIS — G5603 Carpal tunnel syndrome, bilateral upper limbs: Secondary | ICD-10-CM | POA: Diagnosis not present

## 2019-09-20 ENCOUNTER — Other Ambulatory Visit: Payer: Self-pay | Admitting: Gastroenterology

## 2019-09-25 ENCOUNTER — Other Ambulatory Visit: Payer: Self-pay | Admitting: Gastroenterology

## 2019-09-27 ENCOUNTER — Other Ambulatory Visit: Payer: Self-pay | Admitting: Gastroenterology

## 2019-10-02 DIAGNOSIS — G5603 Carpal tunnel syndrome, bilateral upper limbs: Secondary | ICD-10-CM | POA: Diagnosis not present

## 2019-10-02 DIAGNOSIS — R200 Anesthesia of skin: Secondary | ICD-10-CM | POA: Diagnosis not present

## 2019-10-06 ENCOUNTER — Telehealth: Payer: Self-pay | Admitting: *Deleted

## 2019-10-06 ENCOUNTER — Ambulatory Visit: Payer: Medicare Other | Admitting: Family Medicine

## 2019-10-06 ENCOUNTER — Other Ambulatory Visit: Payer: Self-pay | Admitting: Gastroenterology

## 2019-10-06 DIAGNOSIS — G5603 Carpal tunnel syndrome, bilateral upper limbs: Secondary | ICD-10-CM | POA: Diagnosis not present

## 2019-10-06 DIAGNOSIS — M65841 Other synovitis and tenosynovitis, right hand: Secondary | ICD-10-CM | POA: Diagnosis not present

## 2019-10-06 DIAGNOSIS — M65842 Other synovitis and tenosynovitis, left hand: Secondary | ICD-10-CM | POA: Diagnosis not present

## 2019-10-06 NOTE — Telephone Encounter (Signed)
Pt called upset about her omeprazole not being filled, explained that it was prescribed by dr Ardis Hughs and his note stated she would need an appt for refill, she will call his office

## 2019-10-10 ENCOUNTER — Telehealth: Payer: Self-pay | Admitting: Gastroenterology

## 2019-10-10 ENCOUNTER — Other Ambulatory Visit: Payer: Self-pay | Admitting: *Deleted

## 2019-10-10 ENCOUNTER — Other Ambulatory Visit: Payer: Self-pay | Admitting: Gastroenterology

## 2019-10-10 DIAGNOSIS — N183 Chronic kidney disease, stage 3 unspecified: Secondary | ICD-10-CM | POA: Diagnosis not present

## 2019-10-10 DIAGNOSIS — E11319 Type 2 diabetes mellitus with unspecified diabetic retinopathy without macular edema: Secondary | ICD-10-CM

## 2019-10-10 MED ORDER — VICTOZA 18 MG/3ML ~~LOC~~ SOPN: 1.2000 mg | PEN_INJECTOR | Freq: Every day | SUBCUTANEOUS | 11 refills | Status: DC

## 2019-10-10 MED ORDER — INSULIN PEN NEEDLE 32G X 4 MM MISC: 11 refills | Status: DC

## 2019-10-10 NOTE — Telephone Encounter (Signed)
Pt is scheduled for 11/13/19 OV and requested a refill for omeprazole.

## 2019-10-13 ENCOUNTER — Ambulatory Visit: Payer: Medicare Other | Admitting: Family Medicine

## 2019-10-16 DIAGNOSIS — E1122 Type 2 diabetes mellitus with diabetic chronic kidney disease: Secondary | ICD-10-CM | POA: Diagnosis not present

## 2019-10-16 DIAGNOSIS — N1831 Chronic kidney disease, stage 3a: Secondary | ICD-10-CM | POA: Diagnosis not present

## 2019-10-16 DIAGNOSIS — E559 Vitamin D deficiency, unspecified: Secondary | ICD-10-CM | POA: Diagnosis not present

## 2019-10-16 DIAGNOSIS — R801 Persistent proteinuria, unspecified: Secondary | ICD-10-CM | POA: Diagnosis not present

## 2019-10-16 DIAGNOSIS — I129 Hypertensive chronic kidney disease with stage 1 through stage 4 chronic kidney disease, or unspecified chronic kidney disease: Secondary | ICD-10-CM | POA: Diagnosis not present

## 2019-10-27 ENCOUNTER — Other Ambulatory Visit: Payer: Self-pay | Admitting: Gastroenterology

## 2019-11-09 DIAGNOSIS — N1831 Chronic kidney disease, stage 3a: Secondary | ICD-10-CM | POA: Diagnosis not present

## 2019-11-13 ENCOUNTER — Ambulatory Visit (INDEPENDENT_AMBULATORY_CARE_PROVIDER_SITE_OTHER): Payer: Medicare Other | Admitting: Gastroenterology

## 2019-11-13 ENCOUNTER — Encounter: Payer: Self-pay | Admitting: Gastroenterology

## 2019-11-13 VITALS — BP 146/84 | HR 76 | Temp 97.8°F | Ht <= 58 in | Wt 173.8 lb

## 2019-11-13 DIAGNOSIS — K219 Gastro-esophageal reflux disease without esophagitis: Secondary | ICD-10-CM | POA: Diagnosis not present

## 2019-11-13 MED ORDER — OMEPRAZOLE 40 MG PO CPDR: 40.0000 mg | DELAYED_RELEASE_CAPSULE | Freq: Every day | ORAL | 4 refills | Status: DC

## 2019-11-13 NOTE — Patient Instructions (Addendum)
We have sent the following medications to your pharmacy for you to pick up at your convenience: Omeprazole   Start Citrucel once daily. Please start taking citrucel (orange flavored) powder fiber supplement.  This may cause some bloating at first but that usually goes away. Begin with a small spoonful and work your way up to a large, heaping spoonful daily over a week. Call office in 6 weeks to let us know how you are doing on Citrucel.    If you are age 62 or younger, your body mass index should be between 19-25. Your Body mass index is 36.32 kg/m. If this is out of the aformentioned range listed, please consider follow up with your Primary Care Provider.   Thank you for choosing me and [facility].  Dr.Jacobs

## 2019-11-13 NOTE — Progress Notes (Signed)
Review of pertinent gastrointestinal problems:  1. Adenomatous polyp, 12/2008 colonoscopy done for fob+ stool, recall set for 5 year interval; Colonoscopy Dr. Ardis Hughs 03/2014 was normal; recommended recall at 10 year interval. 2. Mucous discharge, abd pain "cloth like" substance coming out of her bottom. She submitted a specimen, pathology said it was a "strand of mucus". Flex sig Dr. Ardis Hughs 2012 was normal. 3. GERD with intermittent dysphagia: EGD 01/2017 Normal examination.  Suspected that her symptoms were related to nonerosive acid reflux.  Eventual speech therapy swallow evaluation 2018: Swallowing function all appear normal.  Recommended small sips bites, several small meals per day, consume liquids during meals, eat slowly.  Remain upright 90 degrees after eating     HPI: This is a very pleasant 63 year old woman whom I last saw almost 3 years ago  Chief complaint is chronic GERD, constipation  She needs omeprazole refills.  She was taking 1 pill once daily and this generally seem to help with her heartburn and belching.  She has not had any dysphagia.  No unintentional weight loss or overt GI bleeding.  She takes Senokot daily for chronic constipation.  Usually helps but sometimes she will still have 2 or 3 days without having a bowel movement.  Her weight February 2018 was 176 pounds, same scale here in our office   ROS: complete GI ROS as described in HPI, all other review negative.  Constitutional:  No unintentional weight loss   Past Medical History:  Diagnosis Date  . Carpal tunnel syndrome, bilateral    confirmed on nerve conduction studies  . Chronic hepatitis C ([place])    considered cured 06/2016 post retroviral therapy   . Diabetes mellitus type 2, uncontrolled ([place])   . Diabetic peripheral neuropathy ([place])   . GERD (gastroesophageal reflux disease)   . History of endometriosis   . History of Helicobacter infection 11/07  . Hyperlipidemia   . Liver fibrosis 02/17/2016    F2/3     Past Surgical History:  Procedure Laterality Date  . ABDOMINAL HYSTERECTOMY    . CARPAL TUNNEL RELEASE      Current Outpatient Medications  Medication Sig Dispense Refill  . ACCU-CHEK FASTCLIX LANCETS MISC Use to check blood sugar up to 3 times a day 306 each 3  . atorvastatin (LIPITOR) 40 MG tablet Take 1 tablet (40 mg total) by mouth daily. 90 tablet 3  . Blood Glucose Monitoring Suppl (ACCU-CHEK GUIDE) w/Device KIT 1 each by Does not apply route 3 (three) times daily. Check blood sugar 3 times a day 1 kit 0  . gabapentin (NEURONTIN) 100 MG capsule Take 1 capsule (100 mg total) by mouth at bedtime. 30 capsule 3  . glucose blood (ACCU-CHEK GUIDE) test strip Check blood sugar 3 times a day 300 each 3  . insulin glargine (LANTUS) 100 UNIT/ML injection INJECT 32 UNITS INTO THE SKIN EVERY MORNING WHEN YOU WAKE UP 60 mL 0  . Insulin Pen Needle 32G X 4 MM MISC Please use with liraglutide.  you will take 1.2 mg daily. 100 each 11  . Insulin Syringe-Needle U-100 31G X 15/64" 0.5 ML MISC Use to inject insulin one time a day 100 each 11  . Insulin Syringes, Disposable, U-100 1 ML MISC 2 Syringes by Does not apply route daily. Use to inject insulin into the skin 1 time daily. diag code E11.9. Insulin dependent 100 each 3  . liraglutide (VICTOZA) 18 MG/3ML SOPN Inject 0.2 mLs (1.2 mg total) into the skin daily before  breakfast. 180 mL 11  . mometasone (NASONEX) 50 MCG/ACT nasal spray Place 2 sprays into the nose daily. (Patient not taking: Reported on 08/04/2019) 17 g 12  . olmesartan (BENICAR) 40 MG tablet TAKE ONE TABLET BY MOUTH DAILY 90 tablet 2  . omeprazole (PRILOSEC) 40 MG capsule TAKE ONE CAPSULE BY MOUTH DAILY ; MAKE DOCTOR'S APPOINTMENT FOR FURTHER REFILLS 30 capsule 0  . senna (SENOKOT) 8.6 MG tablet Take 1 tablet (8.6 mg total) by mouth as needed for constipation. (Patient taking differently: Take 2 tablets by mouth daily. ) 60 tablet 3  . Wheat Dextrin (BENEFIBER PO) Take 1 tablet  by mouth daily as needed (constipation).     No current facility-administered medications for this visit.     Allergies as of 11/13/2019 - Review Complete 11/13/2019  Allergen Reaction Noted  . Ace inhibitors Cough 05/11/2014  . Amitriptyline hcl Nausea And Vomiting 04/02/2014  . Aspirin Other (See Comments) 02/24/2018  . Latex Itching 11/08/2017  . Prednisone Nausea And Vomiting 08/15/2015    Family History  Problem Relation Age of Onset  . Cancer Mother        lung  . Kidney disease Mother   . Lung cancer Maternal Uncle   . Breast cancer Maternal Grandmother   . Liver cancer Maternal Grandfather   . Heart disease Brother   . Depression Brother   . Diabetes Father   . Dementia Father   . Colon cancer Neg Hx     Social History   Socioeconomic History  . Marital status: Single    Spouse name: Not on file  . Number of children: 0  . Years of education: 32  . Highest education level: Not on file  Occupational History  . Occupation: Forensic psychologist: [employer]  . Occupation: Disabled  Social Needs  . Financial resource strain: Not on file  . Food insecurity    Worry: Not on file    Inability: Not on file  . Transportation needs    Medical: Not on file    Non-medical: Not on file  Tobacco Use  . Smoking status: Never Smoker  . Smokeless tobacco: Never Used  Substance and Sexual Activity  . Alcohol use: No    Alcohol/week: 0.0 standard drinks  . Drug use: No  . Sexual activity: Yes    Birth control/protection: None    Comment: 1 committed partner  Lifestyle  . Physical activity    Days per week: Not on file    Minutes per session: Not on file  . Stress: Not on file  Relationships  . Social Herbalist on phone: Not on file    Gets together: Not on file    Attends religious service: Not on file    Active member of club or organization: Not on file    Attends meetings of clubs or organizations: Not on file    Relationship status: Not on file   . Intimate partner violence    Fear of current or ex partner: Not on file    Emotionally abused: Not on file    Physically abused: Not on file    Forced sexual activity: Not on file  Other Topics Concern  . Not on file  Social History Narrative   Current Social History 08/28/2019        Patient lives alone in a 7th floor apartment with elevator. There are not steps up to the entrance the patient uses.  Patient's method of transportation is via family member (mom or friend).      The highest level of education was high school diploma.      The patient currently disabled.      Identified important Relationships are "My mother"       Pets : None       Interests / Fun: "TV, movies"       Current Stressors: "People, my health"       Religious / Personal Beliefs: "B[employer]ist"       L. [place], RN, BSN         Physical Exam: BP (!) 146/84   Pulse 76   Temp 97.8 F (36.6 C)   Ht 4' 10" (1.473 m)   Wt 173 lb 12.8 oz (78.8 kg)   LMP 02/15/2009   BMI 36.32 kg/m  Constitutional: generally well-appearing Psychiatric: alert and oriented x3 Abdomen: soft, nontender, nondistended, no obvious ascites, no peritoneal signs, normal bowel sounds No peripheral edema noted in lower extremities  Assessment and plan: 63 y.o. female with chronic GERD without alarm symptoms, functional constipation  I am happy to refill her omeprazole 40 mg pills 1 pill once daily.  I will be happy to do this again in 1 year however if she still needs prescription strength proton pump inhibitor in 2 years I would like to see her in the office again at that time.  I recommended that she stop taking Senokot as it often causes some dyspeptic side effects and instead start taking Citrucel fiber supplement once daily.  She will call to report on her response in 6 weeks.  Please see the "Patient Instructions" section for addition details about the plan.  Owens Loffler, MD [facility] 11/13/2019, 2:26 PM

## 2019-11-14 DIAGNOSIS — E559 Vitamin D deficiency, unspecified: Secondary | ICD-10-CM | POA: Diagnosis not present

## 2019-11-14 DIAGNOSIS — R801 Persistent proteinuria, unspecified: Secondary | ICD-10-CM | POA: Diagnosis not present

## 2019-11-14 DIAGNOSIS — N1832 Chronic kidney disease, stage 3b: Secondary | ICD-10-CM | POA: Diagnosis not present

## 2019-11-14 DIAGNOSIS — I129 Hypertensive chronic kidney disease with stage 1 through stage 4 chronic kidney disease, or unspecified chronic kidney disease: Secondary | ICD-10-CM | POA: Diagnosis not present

## 2019-11-14 DIAGNOSIS — E1122 Type 2 diabetes mellitus with diabetic chronic kidney disease: Secondary | ICD-10-CM | POA: Diagnosis not present

## 2019-11-16 ENCOUNTER — Other Ambulatory Visit (HOSPITAL_COMMUNITY): Payer: Self-pay | Admitting: Nephrology

## 2019-11-16 DIAGNOSIS — R801 Persistent proteinuria, unspecified: Secondary | ICD-10-CM

## 2019-11-21 DIAGNOSIS — G5603 Carpal tunnel syndrome, bilateral upper limbs: Secondary | ICD-10-CM | POA: Diagnosis not present

## 2019-11-21 DIAGNOSIS — G5602 Carpal tunnel syndrome, left upper limb: Secondary | ICD-10-CM | POA: Diagnosis not present

## 2019-11-21 DIAGNOSIS — M65841 Other synovitis and tenosynovitis, right hand: Secondary | ICD-10-CM | POA: Diagnosis not present

## 2019-11-21 DIAGNOSIS — M65842 Other synovitis and tenosynovitis, left hand: Secondary | ICD-10-CM | POA: Diagnosis not present

## 2019-11-27 ENCOUNTER — Other Ambulatory Visit: Payer: Self-pay | Admitting: Radiology

## 2019-11-27 ENCOUNTER — Other Ambulatory Visit: Payer: Self-pay | Admitting: *Deleted

## 2019-11-27 MED ORDER — OLMESARTAN MEDOXOMIL 40 MG PO TABS: 40.0000 mg | ORAL_TABLET | Freq: Every day | ORAL | 3 refills | Status: DC

## 2019-11-28 ENCOUNTER — Encounter (HOSPITAL_COMMUNITY): Payer: Self-pay

## 2019-11-28 ENCOUNTER — Other Ambulatory Visit: Payer: Self-pay

## 2019-11-28 ENCOUNTER — Ambulatory Visit (HOSPITAL_COMMUNITY)
Admission: RE | Admit: 2019-11-28 | Discharge: 2019-11-28 | Disposition: A | Discharge status: Home / Self Care | Payer: Medicare Other | Source: Ambulatory Visit | Attending: Nephrology | Admitting: Nephrology

## 2019-11-28 DIAGNOSIS — Z888 Allergy status to other drugs, medicaments and biological substances status: Secondary | ICD-10-CM | POA: Diagnosis not present

## 2019-11-28 DIAGNOSIS — Z794 Long term (current) use of insulin: Secondary | ICD-10-CM | POA: Diagnosis not present

## 2019-11-28 DIAGNOSIS — K219 Gastro-esophageal reflux disease without esophagitis: Secondary | ICD-10-CM | POA: Diagnosis not present

## 2019-11-28 DIAGNOSIS — Z801 Family history of malignant neoplasm of trachea, bronchus and lung: Secondary | ICD-10-CM | POA: Insufficient documentation

## 2019-11-28 DIAGNOSIS — Z882 Allergy status to sulfonamides status: Secondary | ICD-10-CM | POA: Insufficient documentation

## 2019-11-28 DIAGNOSIS — Z886 Allergy status to analgesic agent status: Secondary | ICD-10-CM | POA: Diagnosis not present

## 2019-11-28 DIAGNOSIS — Z803 Family history of malignant neoplasm of breast: Secondary | ICD-10-CM | POA: Insufficient documentation

## 2019-11-28 DIAGNOSIS — E1165 Type 2 diabetes mellitus with hyperglycemia: Secondary | ICD-10-CM | POA: Insufficient documentation

## 2019-11-28 DIAGNOSIS — E785 Hyperlipidemia, unspecified: Secondary | ICD-10-CM | POA: Diagnosis not present

## 2019-11-28 DIAGNOSIS — R801 Persistent proteinuria, unspecified: Secondary | ICD-10-CM | POA: Insufficient documentation

## 2019-11-28 DIAGNOSIS — Z800 Family history of malignant neoplasm of digestive organs: Secondary | ICD-10-CM | POA: Diagnosis not present

## 2019-11-28 DIAGNOSIS — N189 Chronic kidney disease, unspecified: Secondary | ICD-10-CM | POA: Diagnosis not present

## 2019-11-28 DIAGNOSIS — Z833 Family history of diabetes mellitus: Secondary | ICD-10-CM | POA: Diagnosis not present

## 2019-11-28 DIAGNOSIS — E1142 Type 2 diabetes mellitus with diabetic polyneuropathy: Secondary | ICD-10-CM | POA: Insufficient documentation

## 2019-11-28 DIAGNOSIS — Z79899 Other long term (current) drug therapy: Secondary | ICD-10-CM | POA: Diagnosis not present

## 2019-11-28 DIAGNOSIS — Z91040 Latex allergy status: Secondary | ICD-10-CM | POA: Insufficient documentation

## 2019-11-28 DIAGNOSIS — R809 Proteinuria, unspecified: Secondary | ICD-10-CM | POA: Diagnosis not present

## 2019-11-28 DIAGNOSIS — E1122 Type 2 diabetes mellitus with diabetic chronic kidney disease: Secondary | ICD-10-CM | POA: Insufficient documentation

## 2019-11-28 DIAGNOSIS — Z90710 Acquired absence of both cervix and uterus: Secondary | ICD-10-CM | POA: Diagnosis not present

## 2019-11-28 LAB — CBC
HCT: 40.6 % (ref 36.0–46.0)
Hemoglobin: 13.6 g/dL (ref 12.0–15.0)
MCH: 28 pg (ref 26.0–34.0)
MCHC: 33.5 g/dL (ref 30.0–36.0)
MCV: 83.5 fL (ref 80.0–100.0)
Platelets: 281 10*3/uL (ref 150–400)
RBC: 4.86 MIL/uL (ref 3.87–5.11)
RDW: 13.9 % (ref 11.5–15.5)
WBC: 13.3 10*3/uL — ABNORMAL HIGH (ref 4.0–10.5)
nRBC: 0 % (ref 0.0–0.2)

## 2019-11-28 LAB — GLUCOSE, CAPILLARY
Glucose-Capillary: 152 mg/dL — ABNORMAL HIGH (ref 70–99)
Glucose-Capillary: 163 mg/dL — ABNORMAL HIGH (ref 70–99)

## 2019-11-28 LAB — PROTIME-INR
INR: 1 (ref 0.8–1.2)
Prothrombin Time: 12.5 seconds (ref 11.4–15.2)

## 2019-11-28 MED ORDER — SODIUM CHLORIDE 0.9 % IV SOLN
INTRAVENOUS | Status: AC | PRN
  Administered 2019-11-28: 10 mL/h via INTRAVENOUS

## 2019-11-28 MED ORDER — FENTANYL CITRATE (PF) 100 MCG/2ML IJ SOLN
INTRAMUSCULAR | Status: AC
  Filled 2019-11-28: qty 2

## 2019-11-28 MED ORDER — MIDAZOLAM HCL 2 MG/2ML IJ SOLN
INTRAMUSCULAR | Status: AC | PRN
  Administered 2019-11-28 (×2): 1 mg via INTRAVENOUS

## 2019-11-28 MED ORDER — SODIUM CHLORIDE 0.9 % IV SOLN: INTRAVENOUS | Status: DC

## 2019-11-28 MED ORDER — HYDRALAZINE HCL 20 MG/ML IJ SOLN
INTRAMUSCULAR | Status: AC | PRN
  Administered 2019-11-28: 10 mg via INTRAVENOUS

## 2019-11-28 MED ORDER — FENTANYL CITRATE (PF) 100 MCG/2ML IJ SOLN
INTRAMUSCULAR | Status: AC | PRN
  Administered 2019-11-28: 50 ug via INTRAVENOUS

## 2019-11-28 MED ORDER — LIDOCAINE HCL (PF) 1 % IJ SOLN
INTRAMUSCULAR | Status: AC
  Filled 2019-11-28: qty 30

## 2019-11-28 MED ORDER — HYDRALAZINE HCL 20 MG/ML IJ SOLN
INTRAMUSCULAR | Status: AC
  Filled 2019-11-28: qty 1

## 2019-11-28 MED ORDER — GELATIN ABSORBABLE 12-7 MM EX MISC
CUTANEOUS | Status: AC
  Filled 2019-11-28: qty 1

## 2019-11-28 MED ORDER — MIDAZOLAM HCL 2 MG/2ML IJ SOLN
INTRAMUSCULAR | Status: AC
  Filled 2019-11-28: qty 2

## 2019-11-28 NOTE — H&P (Signed)
Chief Complaint: Patient was seen in consultation today for random renal biopsy at the request of Harrie Jeans C  Referring Physician(s): Claudia Desanctis  Supervising Physician: Aletta Edouard  Patient Status: [facility] - Out-pt  History of Present Illness: Nancy [name] is a 62 y.o. female   Pt has been followed by primary MD for at least 1 yr for proteinuria Was referred to Dr Royce Macadamia 2-3 months ago Now scheduled for random renal biopsy per Nephrology   Past Medical History:  Diagnosis Date  . Carpal tunnel syndrome, bilateral    confirmed on nerve conduction studies  . Chronic hepatitis C ([place])    considered cured 06/2016 post retroviral therapy   . Diabetes mellitus type 2, uncontrolled ([place])   . Diabetic peripheral neuropathy ([place])   . GERD (gastroesophageal reflux disease)   . History of endometriosis   . History of Helicobacter infection 11/07  . Hyperlipidemia   . Liver fibrosis 02/17/2016   F2/3     Past Surgical History:  Procedure Laterality Date  . ABDOMINAL HYSTERECTOMY    . CARPAL TUNNEL RELEASE      Allergies: Ace inhibitors, Amitriptyline hcl, Aspirin, Latex, and Prednisone  Medications: Prior to Admission medications   Medication Sig Start Date End Date Taking? Authorizing Provider  ACCU-CHEK FASTCLIX LANCETS MISC Use to check blood sugar up to 3 times a day 10/11/18  Yes Ledell Noss, MD  atorvastatin (LIPITOR) 40 MG tablet Take 1 tablet (40 mg total) by mouth daily. 10/18/18  Yes Ledell Noss, MD  Blood Glucose Monitoring Suppl (ACCU-CHEK GUIDE) w/Device KIT 1 each by Does not apply route 3 (three) times daily. Check blood sugar 3 times a day 10/11/18  Yes Ledell Noss, MD  gabapentin (NEURONTIN) 100 MG capsule Take 1 capsule (100 mg total) by mouth at bedtime. 09/01/19  Yes Jerolyn Shin, MD  glucose blood (ACCU-CHEK GUIDE) test strip Check blood sugar 3 times a day 10/11/18  Yes Ledell Noss, MD  insulin glargine (LANTUS) 100 UNIT/ML injection INJECT  32 UNITS INTO THE SKIN EVERY MORNING WHEN YOU WAKE UP 09/12/19  Yes Al Decant, MD  Insulin Pen Needle 32G X 4 MM MISC Please use with liraglutide.  you will take 1.2 mg daily. 10/10/19  Yes Al Decant, MD  Insulin Syringe-Needle U-100 31G X 15/64" 0.5 ML MISC Use to inject insulin one time a day 08/31/18  Yes Ledell Noss, MD  Insulin Syringes, Disposable, U-100 1 ML MISC 2 Syringes by Does not apply route daily. Use to inject insulin into the skin 1 time daily. diag code E11.9. Insulin dependent 12/15/16  Yes Ledell Noss, MD  liraglutide (VICTOZA) 18 MG/3ML SOPN Inject 0.2 mLs (1.2 mg total) into the skin daily before breakfast. 10/10/19  Yes Al Decant, MD  olmesartan (BENICAR) 40 MG tablet Take 1 tablet (40 mg total) by mouth daily. 11/27/19  Yes Al Decant, MD  omeprazole (PRILOSEC) 40 MG capsule Take 1 capsule (40 mg total) by mouth daily. 11/13/19  Yes Milus Banister, MD  senna (SENOKOT) 8.6 MG tablet Take 1 tablet (8.6 mg total) by mouth as needed for constipation. Patient taking differently: Take 2 tablets by mouth daily.  08/04/17  Yes Ledell Noss, MD  Wheat Dextrin (BENEFIBER PO) Take 1 tablet by mouth daily as needed (constipation).   Yes [provider]  mometasone (NASONEX) 50 MCG/ACT nasal spray Place 2 sprays into the nose daily. Patient not taking: Reported on 08/04/2019 12/21/17   Ledell Noss, MD  Family History  Problem Relation Age of Onset  . Cancer Mother        lung  . Kidney disease Mother   . Lung cancer Maternal Uncle   . Breast cancer Maternal Grandmother   . Liver cancer Maternal Grandfather   . Heart disease Brother   . Depression Brother   . Diabetes Father   . Dementia Father   . Colon cancer Neg Hx     Social History   Socioeconomic History  . Marital status: Single    Spouse name: Not on file  . Number of children: 0  . Years of education: 15  . Highest education level: Not on file  Occupational History  . Occupation: Cytogeneticist: [employer]  . Occupation: Disabled  Tobacco Use  . Smoking status: Never Smoker  . Smokeless tobacco: Never Used  Substance and Sexual Activity  . Alcohol use: No    Alcohol/week: 0.0 standard drinks  . Drug use: No  . Sexual activity: Yes    Birth control/protection: None    Comment: 1 committed partner  Other Topics Concern  . Not on file  Social History Narrative   Current Social History 08/28/2019        Patient lives alone in a 7th floor apartment with elevator. There are not steps up to the entrance the patient uses.       Patient's method of transportation is via family member (mom or friend).      The highest level of education was high school diploma.      The patient currently disabled.      Identified important Relationships are "My mother"       Pets : None       Interests / Fun: "TV, movies"       Current Stressors: "People, my health"       Religious / Personal Beliefs: "B[employer]ist"       L. [place]atte, RN, BSN       Social Determinants of Health   Financial Resource Strain:   . Difficulty of Paying Living Expenses: Not on file  Food Insecurity:   . Worried About Charity fundraiser in the Last Year: Not on file  . Ran Out of Food in the Last Year: Not on file  Transportation Needs:   . Lack of Transportation (Medical): Not on file  . Lack of Transportation (Non-Medical): Not on file  Physical Activity:   . Days of Exercise per Week: Not on file  . Minutes of Exercise per Session: Not on file  Stress:   . Feeling of Stress : Not on file  Social Connections:   . Frequency of Communication with Friends and Family: Not on file  . Frequency of Social Gatherings with Friends and Family: Not on file  . Attends Religious Services: Not on file  . Active Member of Clubs or Organizations: Not on file  . Attends Archivist Meetings: Not on file  . Marital Status: Not on file    Review of Systems: A 12 point ROS discussed and pertinent  positives are indicated in the HPI above.  All other systems are negative.  Review of Systems  Constitutional: Negative for activity change, fatigue and fever.  Respiratory: Negative for cough and shortness of breath.   Cardiovascular: Negative for chest pain.  Gastrointestinal: Negative for abdominal pain.  Musculoskeletal: Negative for back pain.  Neurological: Negative for weakness.  Psychiatric/Behavioral: Negative for behavioral problems  and confusion.    Vital Signs: BP (!) 165/95   Pulse 74   Temp 97.9 F (36.6 C) (Oral)   Resp 16   Ht _0  (1.473 m)   Wt 176 lb (79.8 kg)   LMP 02/15/2009   SpO2 100%   BMI 36.78 kg/m   Physical Exam Vitals reviewed.  Cardiovascular:     Rate and Rhythm: Normal rate and regular rhythm.     Heart sounds: Normal heart sounds.  Pulmonary:     Effort: Pulmonary effort is normal.     Breath sounds: Normal breath sounds.  Abdominal:     Palpations: Abdomen is soft.  Musculoskeletal:        General: Normal range of motion.  Skin:    General: Skin is warm and dry.  Neurological:     Mental Status: She is alert and oriented to person, place, and time.  Psychiatric:        Mood and Affect: Mood normal.        Behavior: Behavior normal.        Thought Content: Thought content normal.        Judgment: Judgment normal.     Imaging: No results found.  Labs:  CBC: Recent Labs    07/18/19 1246 11/28/19 0614  WBC 8.4 13.3*  HGB 11.5* 13.6  HCT 35.1* 40.6  PLT 249 281    COAGS: No results for input(s): INR, APTT in the last 8760 hours.  BMP: Recent Labs    07/18/19 1246  NA 142  K 3.7  CL 111  CO2 25  GLUCOSE 141*  BUN 19  CALCIUM 9.1  CREATININE 1.31*  GFRNONAA 44*  GFRAA 51*    LIVER FUNCTION TESTS: Recent Labs    07/18/19 1246  BILITOT 0.4  AST 18  ALT 16  ALKPHOS 65  PROT 7.1  ALBUMIN 3.4*    TUMOR MARKERS: No results for input(s): AFPTM, CEA, CA199, CHROMGRNA in the last 8760  hours.  Assessment and Plan:  Persistent proteinuria Scheduled for random renal biopsy Risks and benefits of random renal biopsy was discussed with the patient and/or patient's family including, but not limited to bleeding, infection, damage to adjacent structures or low yield requiring additional tests.  All of the questions were answered and there is agreement to proceed. Consent signed and in chart.   Thank you for this interesting consult.  I greatly enjoyed meeting Nancy [name] and look forward to participating in their care.  A copy of this report was sent to the requesting provider on this date.  Electronically Signed: Lavonia Drafts, PA-C 11/28/2019, 7:04 AM   I spent a total of  30 Minutes   in face to face in clinical consultation, greater than 50% of which was counseling/coordinating care for random renal biopsy

## 2019-11-28 NOTE — Progress Notes (Signed)
Discharge instructions reviewed with pt and her mother both voice understanding.

## 2019-11-28 NOTE — Discharge Instructions (Signed)
Percutaneous Kidney Biopsy, Care After °This sheet gives you information about how to care for yourself after your procedure. Your health care provider may also give you more specific instructions. If you have problems or questions, contact your health care provider. °What can I expect after the procedure? °After the procedure, it is common to have: °· Pain or soreness near the area where the needle went through your skin (biopsy site). °· Bright pink or cloudy urine for 24 hours after the procedure. °Follow these instructions at home: °Activity °· Return to your normal activities as told by your health care provider. Ask your health care provider what activities are safe for you. °· Do not drive for 24 hours if you were given a medicine to help you relax (sedative). °· Do not lift anything that is heavier than 10 lb (4.5 kg) until your health care provider tells you that it is safe. °· Avoid activities that take a lot of effort (are strenuous) until your health care provider approves. Most people will have to wait 2 weeks before returning to activities such as exercise or sexual intercourse. °General instructions ° °· Take over-the-counter and prescription medicines only as told by your health care provider. °· You may eat and drink after your procedure. Follow instructions from your health care provider about eating or drinking restrictions. °· Check your biopsy site every day for signs of infection. Check for: °? More redness, swelling, or pain. °? More fluid or blood. °? Warmth. °? Pus or a bad smell. °· Keep all follow-up visits as told by your health care provider. This is important. °Contact a health care provider if: °· You have more redness, swelling, or pain around your biopsy site. °· You have more fluid or blood coming from your biopsy site. °· Your biopsy site feels warm to the touch. °· You have pus or a bad smell coming from your biopsy site. °· You have blood in your urine more than 24 hours after  your procedure. °Get help right away if: °· You have dark red or brown urine. °· You have a fever. °· You are unable to urinate. °· You feel burning when you urinate. °· You feel faint. °· You have severe pain in your abdomen or side. °This information is not intended to replace advice given to you by your health care provider. Make sure you discuss any questions you have with your health care provider. °Document Released: 07/26/2013 Document Revised: 11/05/2017 Document Reviewed: 09/04/2016 °Elsevier Patient Education © 2020 Elsevier Inc. °Moderate Conscious Sedation, Adult, Care After °These instructions provide you with information about caring for yourself after your procedure. Your health care provider may also give you more specific instructions. Your treatment has been planned according to current medical practices, but problems sometimes occur. Call your health care provider if you have any problems or questions after your procedure. °What can I expect after the procedure? °After your procedure, it is common: °· To feel sleepy for several hours. °· To feel clumsy and have poor balance for several hours. °· To have poor judgment for several hours. °· To vomit if you eat too soon. °Follow these instructions at home: °For at least 24 hours after the procedure: ° °· Do not: °? Participate in activities where you could fall or become injured. °? Drive. °? Use heavy machinery. °? Drink alcohol. °? Take sleeping pills or medicines that cause drowsiness. °? Make important decisions or sign legal documents. °? Take care of children on your   own. °· Rest. °Eating and drinking °· Follow the diet recommended by your health care provider. °· If you vomit: °? Drink water, juice, or soup when you can drink without vomiting. °? Make sure you have little or no nausea before eating solid foods. °General instructions °· Have a responsible adult stay with you until you are awake and alert. °· Take over-the-counter and  prescription medicines only as told by your health care provider. °· If you smoke, do not smoke without supervision. °· Keep all follow-up visits as told by your health care provider. This is important. °Contact a health care provider if: °· You keep feeling nauseous or you keep vomiting. °· You feel light-headed. °· You develop a rash. °· You have a fever. °Get help right away if: °· You have trouble breathing. °This information is not intended to replace advice given to you by your health care provider. Make sure you discuss any questions you have with your health care provider. °Document Released: 09/13/2013 Document Revised: 11/05/2017 Document Reviewed: 03/14/2016 °Elsevier Patient Education © 2020 Elsevier Inc. ° °

## 2019-11-28 NOTE — Procedures (Signed)
Interventional Radiology Procedure Note  Procedure: US Guided Biopsy of left kidney  Complications: None  Estimated Blood Loss: < 10 mL  Findings: 16 G core biopsy of left kidney performed under US guidance.  Two core samples obtained and sent to Pathology.  Blayre Papania T. Lyan Moyano, M.D Pager:  [phone]    

## 2019-12-11 DIAGNOSIS — N1832 Chronic kidney disease, stage 3b: Secondary | ICD-10-CM | POA: Diagnosis not present

## 2019-12-12 ENCOUNTER — Encounter: Payer: Medicare Other | Admitting: Radiation Oncology

## 2019-12-12 ENCOUNTER — Encounter (HOSPITAL_COMMUNITY): Payer: Self-pay

## 2019-12-12 ENCOUNTER — Other Ambulatory Visit: Payer: Self-pay

## 2019-12-12 LAB — SURGICAL PATHOLOGY

## 2019-12-12 NOTE — Progress Notes (Unsigned)
  [facility] Telephone Encounter Continuity Care Appointment  HPI:   This telephone encounter was created for [name] on 12/12/2019 for the following purpose/cc: diabetes fu.   Past Medical History:  Past Medical History:  Diagnosis Date  . Carpal tunnel syndrome, bilateral    confirmed on nerve conduction studies  . Chronic hepatitis C ([place])    considered cured 06/2016 post retroviral therapy   . Diabetes mellitus type 2, uncontrolled ([place])   . Diabetic peripheral neuropathy ([place])   . GERD (gastroesophageal reflux disease)   . History of endometriosis   . History of Helicobacter infection 11/07  . Hyperlipidemia   . Liver fibrosis 02/17/2016   F2/3       ROS:   Please see A&P under problem oriented charting for full ROS.   Assessment / Plan / Recommendations:   Please see A&P under problem oriented charting for assessment of the patient's acute and chronic medical conditions.   As always, pt is advised that if symptoms worsen or new symptoms arise, they should go to an urgent care facility or to to ER for further evaluation.   Consent and Medical Decision Making:   Patient {GC/GE:[id number]::"discussed with","seen with"} Dr. {NAMES:[id number]::"Butcher","Granfortuna","E. Hoffman","Klima","Mullen","Narendra","Raines","Vincent"}  This is a telephone encounter between [employer] and Nancy [name] on 12/12/2019 for ***. The visit was conducted with the patient located at {NAMES:[id number]::"home"} and Nancy [name] at [employer]. The patient's identity was confirmed using their DOB and current address. The {WHO:3044014::"patient","his/her legal guardian","***"} has consented to being evaluated through a telephone encounter and understands the associated risks (an examination cannot be done and the patient may need to come in for an appointment) / benefits (allows the patient to remain at home, decreasing exposure to  coronavirus). I personally spent {Numbers; [phone]} minutes on medical discussion.

## 2019-12-19 DIAGNOSIS — N1832 Chronic kidney disease, stage 3b: Secondary | ICD-10-CM | POA: Diagnosis not present

## 2019-12-19 DIAGNOSIS — I129 Hypertensive chronic kidney disease with stage 1 through stage 4 chronic kidney disease, or unspecified chronic kidney disease: Secondary | ICD-10-CM | POA: Diagnosis not present

## 2019-12-19 DIAGNOSIS — E1122 Type 2 diabetes mellitus with diabetic chronic kidney disease: Secondary | ICD-10-CM | POA: Diagnosis not present

## 2019-12-19 DIAGNOSIS — E559 Vitamin D deficiency, unspecified: Secondary | ICD-10-CM | POA: Diagnosis not present

## 2019-12-19 DIAGNOSIS — R801 Persistent proteinuria, unspecified: Secondary | ICD-10-CM | POA: Diagnosis not present

## 2020-01-02 ENCOUNTER — Ambulatory Visit (INDEPENDENT_AMBULATORY_CARE_PROVIDER_SITE_OTHER): Payer: Medicare Other | Admitting: Radiation Oncology

## 2020-01-02 ENCOUNTER — Other Ambulatory Visit: Payer: Self-pay

## 2020-01-02 DIAGNOSIS — E1159 Type 2 diabetes mellitus with other circulatory complications: Secondary | ICD-10-CM | POA: Diagnosis not present

## 2020-01-02 DIAGNOSIS — I10 Essential (primary) hypertension: Secondary | ICD-10-CM | POA: Diagnosis not present

## 2020-01-02 DIAGNOSIS — Z79899 Other long term (current) drug therapy: Secondary | ICD-10-CM | POA: Diagnosis not present

## 2020-01-02 DIAGNOSIS — I152 Hypertension secondary to endocrine disorders: Secondary | ICD-10-CM

## 2020-01-02 DIAGNOSIS — M62838 Other muscle spasm: Secondary | ICD-10-CM

## 2020-01-02 NOTE — Assessment & Plan Note (Addendum)
-  Nancy [name] seen today for blood pressure check via telephone encounter -she had previously well controlled blood pressure with olemsartan 40 daily and spironolactone 50 daily; she was taken off spironolactone 50 daily by her nephrologist and maintained good pressures until her last visit -bp at last visit 178/101 with recheck of 174/97 -pt provided with cuff and instructed on how to use it and to record her blood pressures for tele health appointment in 4-6 weeks time -today, patient reports issues with her cuff and that she was only able to record 2 blood pressures: 185/80 and 170/70 -in the interim since her last visit she was seen at the GI doctor where her BP was 146/84 and for a kidney biopsy at which time her BP was 140/78 -pt reports taking her medication daily -given the elevated BP at home and at interval appointments, it is reasonable to start another medication; unfortunately, we are unable to see her kidney doctor notes to see if they have plans for additional BP medication as well  Plan for uncontrolled hypertension: -obtain records from Dr. Royce Macadamia -if their records are in line with our thinking and there are no contraindications or reasons to hold additional medication, we will start amlodipine 5 mg daily with BP recheck in the office in 4 weeks   Addendum: Received records from nephrologist, Dr. Royce Macadamia, who is managing patient's blood pressure and recently placed her on diltiazem 120 daily. She is also encouraging home bp monitoring.

## 2020-01-02 NOTE — Assessment & Plan Note (Addendum)
Patient with history of muscle spasms unrelieved with tylenol and heat. She reports spasms intermittently during the day and at night. She reports drinking around 64 oz of water per day. We discussed the importance of drinking more water, around at least 100 oz a day. She was amenable to trying to increase fluid intake and if sx persist to call for inpatient appointment.   Plan for uncontrolled muscle spasms: -increase water intake to 100 oz/day -if sx persist call for inpatient appointment

## 2020-01-02 NOTE — Progress Notes (Signed)
Internal Medicine Clinic Attending  Case discussed with Dr. Madilyn Fireman at the time of the visit.  We reviewed the resident's history and exam and pertinent patient test results.  I agree with the assessment, diagnosis, and plan of care documented in the resident's note.  Lenice Pressman, M.D., Ph.D.

## 2020-01-02 NOTE — Progress Notes (Signed)
  [facility] Telephone Encounter Continuity Care Appointment  HPI:   This telephone encounter was created for Ms. Marvette Schamp on 01/02/2020 for the following purpose/cc: blood pressure check.   Past Medical History:  Past Medical History:  Diagnosis Date  . Carpal tunnel syndrome, bilateral    confirmed on nerve conduction studies  . Chronic hepatitis C ([place])    considered cured 06/2016 post retroviral therapy   . Diabetes mellitus type 2, uncontrolled ([place])   . Diabetic peripheral neuropathy ([place])   . GERD (gastroesophageal reflux disease)   . History of endometriosis   . History of Helicobacter infection 11/07  . Hyperlipidemia   . Liver fibrosis 02/17/2016   F2/3       ROS:   Please see A&P under problem oriented charting for full ROS.   Assessment / Plan / Recommendations:   Please see A&P under problem oriented charting for assessment of the patient's acute and chronic medical conditions.   As always, pt is advised that if symptoms worsen or new symptoms arise, they should go to an urgent care facility or to to ER for further evaluation.   Consent and Medical Decision Making:   Patient discussed with Dr. Rebeca Alert  This is a telephone encounter between [facility] and Al Decant on 01/02/2020 for a blood pressure check. The visit was conducted with the patient located at home and Al Decant at [facility]. The patient's identity was confirmed using their DOB and current address. The patient has consented to being evaluated through a telephone encounter and understands the associated risks (an examination cannot be done and the patient may need to come in for an appointment) / benefits (allows the patient to remain at home, decreasing exposure to coronavirus). I personally spent 30 minutes on medical discussion.

## 2020-01-04 ENCOUNTER — Other Ambulatory Visit: Payer: Self-pay | Admitting: *Deleted

## 2020-01-04 MED ORDER — ATORVASTATIN CALCIUM 40 MG PO TABS: 40.0000 mg | ORAL_TABLET | Freq: Every day | ORAL | 3 refills | Status: DC

## 2020-01-05 DIAGNOSIS — M65842 Other synovitis and tenosynovitis, left hand: Secondary | ICD-10-CM | POA: Diagnosis not present

## 2020-01-05 DIAGNOSIS — M65841 Other synovitis and tenosynovitis, right hand: Secondary | ICD-10-CM | POA: Diagnosis not present

## 2020-01-05 DIAGNOSIS — G5603 Carpal tunnel syndrome, bilateral upper limbs: Secondary | ICD-10-CM | POA: Diagnosis not present

## 2020-01-18 DIAGNOSIS — M65342 Trigger finger, left ring finger: Secondary | ICD-10-CM | POA: Diagnosis not present

## 2020-01-18 DIAGNOSIS — G5602 Carpal tunnel syndrome, left upper limb: Secondary | ICD-10-CM | POA: Diagnosis not present

## 2020-01-31 DIAGNOSIS — M79642 Pain in left hand: Secondary | ICD-10-CM | POA: Diagnosis not present

## 2020-02-06 DIAGNOSIS — E113213 Type 2 diabetes mellitus with mild nonproliferative diabetic retinopathy with macular edema, bilateral: Secondary | ICD-10-CM | POA: Diagnosis not present

## 2020-02-06 LAB — HM DIABETES EYE EXAM

## 2020-02-13 ENCOUNTER — Encounter: Payer: Self-pay | Admitting: *Deleted

## 2020-02-15 DIAGNOSIS — M79642 Pain in left hand: Secondary | ICD-10-CM | POA: Diagnosis not present

## 2020-03-06 DIAGNOSIS — G5602 Carpal tunnel syndrome, left upper limb: Secondary | ICD-10-CM | POA: Diagnosis not present

## 2020-03-06 DIAGNOSIS — M79642 Pain in left hand: Secondary | ICD-10-CM | POA: Diagnosis not present

## 2020-04-12 ENCOUNTER — Encounter: Payer: Self-pay | Admitting: *Deleted

## 2020-04-15 DIAGNOSIS — I129 Hypertensive chronic kidney disease with stage 1 through stage 4 chronic kidney disease, or unspecified chronic kidney disease: Secondary | ICD-10-CM | POA: Diagnosis not present

## 2020-04-15 DIAGNOSIS — E1122 Type 2 diabetes mellitus with diabetic chronic kidney disease: Secondary | ICD-10-CM | POA: Diagnosis not present

## 2020-04-15 DIAGNOSIS — E559 Vitamin D deficiency, unspecified: Secondary | ICD-10-CM | POA: Diagnosis not present

## 2020-04-15 DIAGNOSIS — R801 Persistent proteinuria, unspecified: Secondary | ICD-10-CM | POA: Diagnosis not present

## 2020-04-15 DIAGNOSIS — N1832 Chronic kidney disease, stage 3b: Secondary | ICD-10-CM | POA: Diagnosis not present

## 2020-05-07 DIAGNOSIS — M65841 Other synovitis and tenosynovitis, right hand: Secondary | ICD-10-CM | POA: Diagnosis not present

## 2020-05-07 DIAGNOSIS — G5603 Carpal tunnel syndrome, bilateral upper limbs: Secondary | ICD-10-CM | POA: Diagnosis not present

## 2020-05-17 ENCOUNTER — Other Ambulatory Visit: Payer: Self-pay | Admitting: Radiation Oncology

## 2020-05-17 DIAGNOSIS — Z1231 Encounter for screening mammogram for malignant neoplasm of breast: Secondary | ICD-10-CM

## 2020-05-28 ENCOUNTER — Encounter: Payer: Medicare Other | Admitting: Radiation Oncology

## 2020-05-29 IMAGING — MG DIGITAL SCREENING BILATERAL MAMMOGRAM WITH TOMO AND CAD
8 series · 8 of 24 positions shown · non-contrast
Comparison: Previous exam(s).

ACR Breast Density Category a: The breast tissue is almost entirely
fatty.

CLINICAL DATA: Screening.

EXAM:
DIGITAL SCREENING BILATERAL MAMMOGRAM WITH TOMO AND CAD

[R MLO synth-2D]
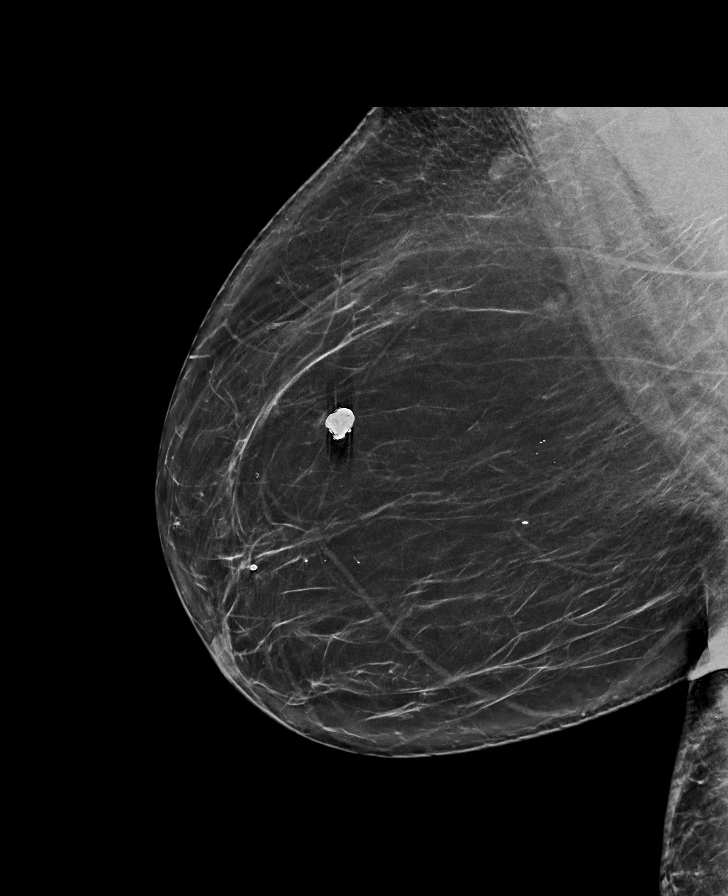

[R CC synth-2D]
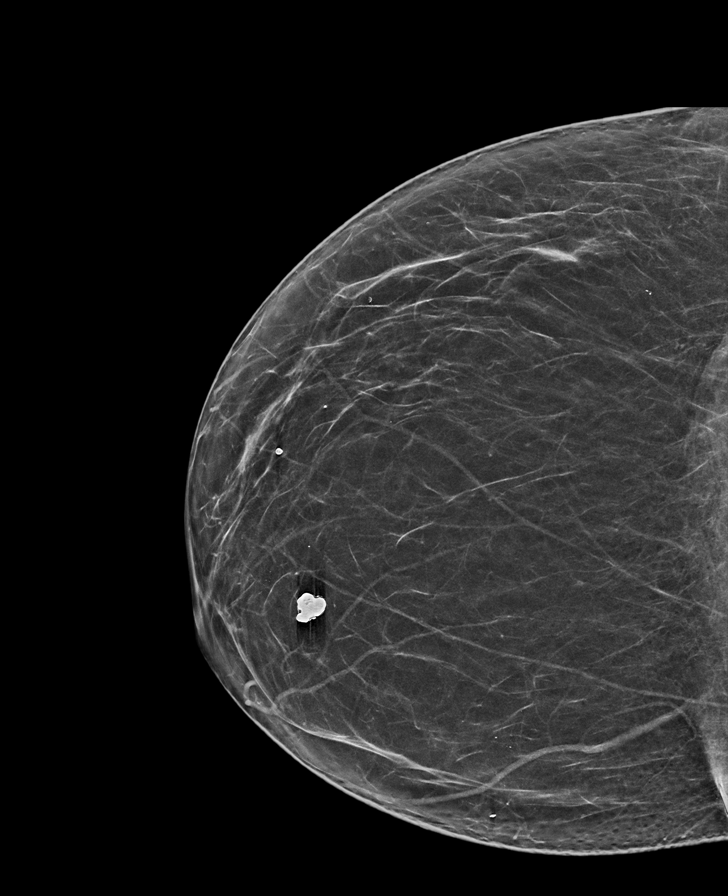

[L CC synth-2D]
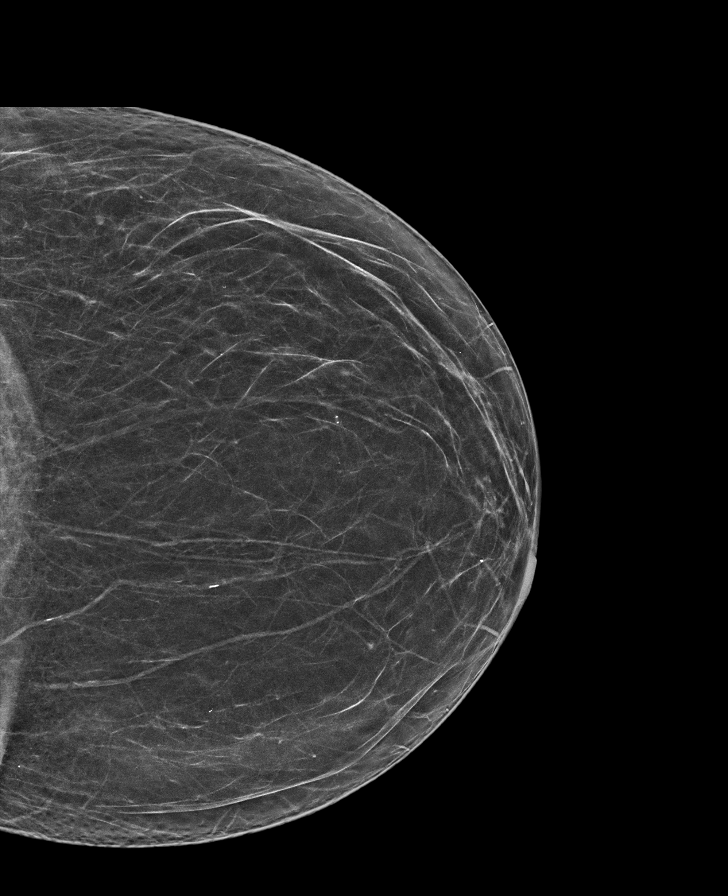

[L MLO synth-2D]
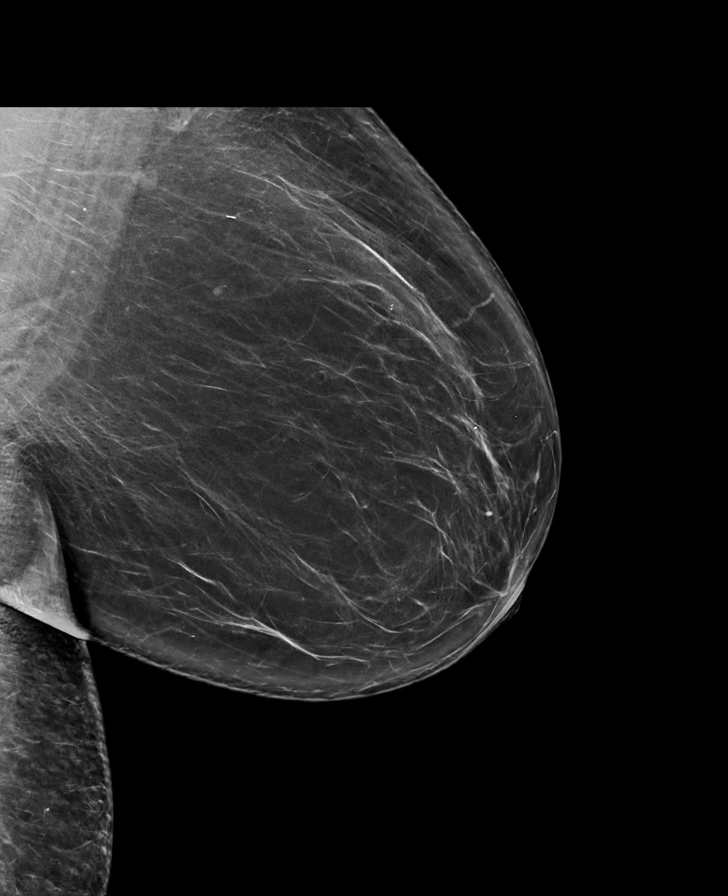

[R MLO tomo · tomo slice 45/88.0]
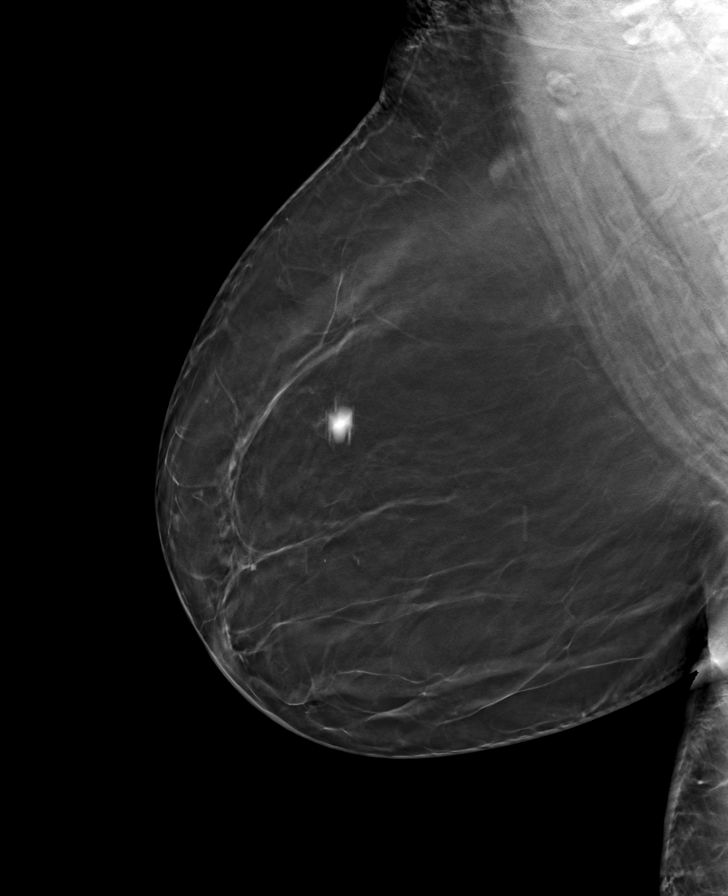

[L MLO tomo · tomo slice 45/88.0]
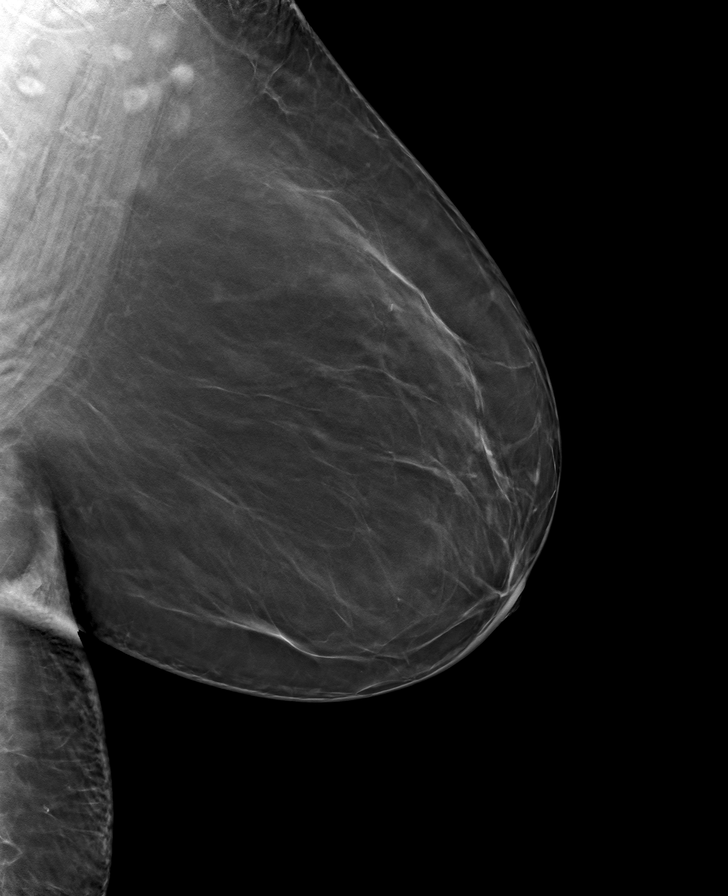

[L CC tomo · tomo slice 35/68.0]
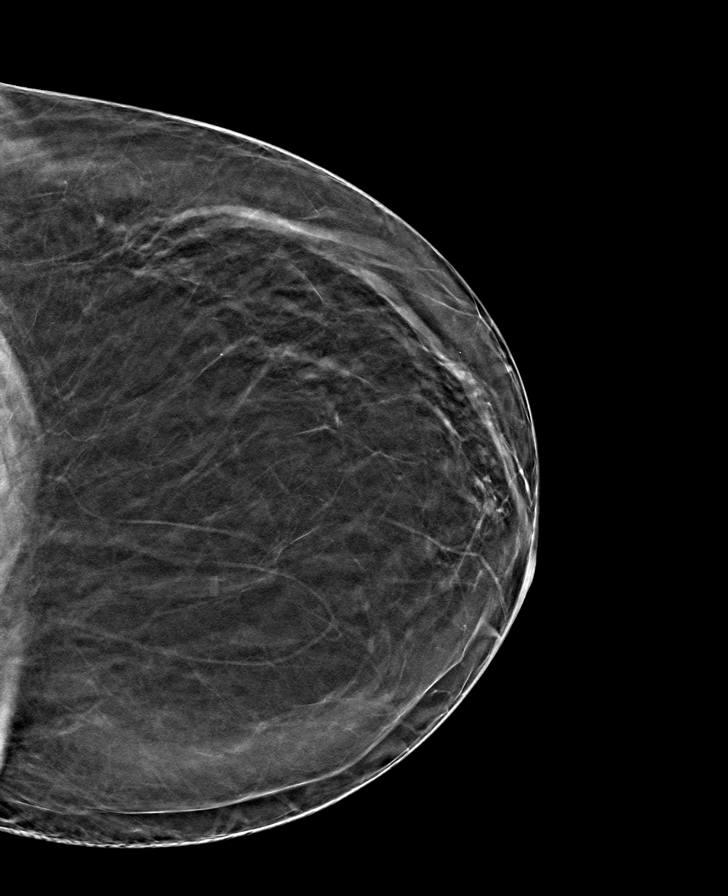

[R CC tomo · tomo slice 35/69.0]
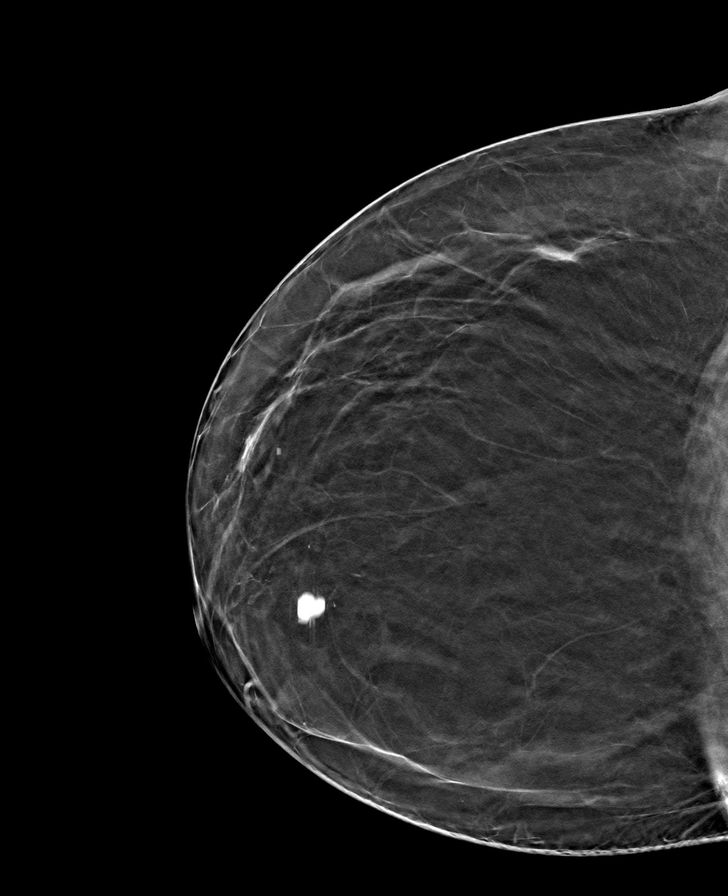

[8 of 24 positions shown; findings below may reference images not displayed]

FINDINGS: There are no findings suspicious for malignancy. Images were
processed with CAD.
IMPRESSION: No mammographic evidence of malignancy. A result letter of this
screening mammogram will be mailed directly to the patient.

RECOMMENDATION:
Screening mammogram in one year. (Code:8Y-[id number])

BI-RADS CATEGORY  1: Negative.

## 2020-06-02 IMAGING — US US RENAL
1 series · 14 of 25 positions shown · non-contrast
Comparison: November 07, 2015

CLINICAL DATA: Chronic renal disease.

EXAM:
RENAL / URINARY TRACT ULTRASOUND COMPLETE

[Series 1: us renal · 0.19mm/px · 14 of 31 slices shown]
[im 1/31]
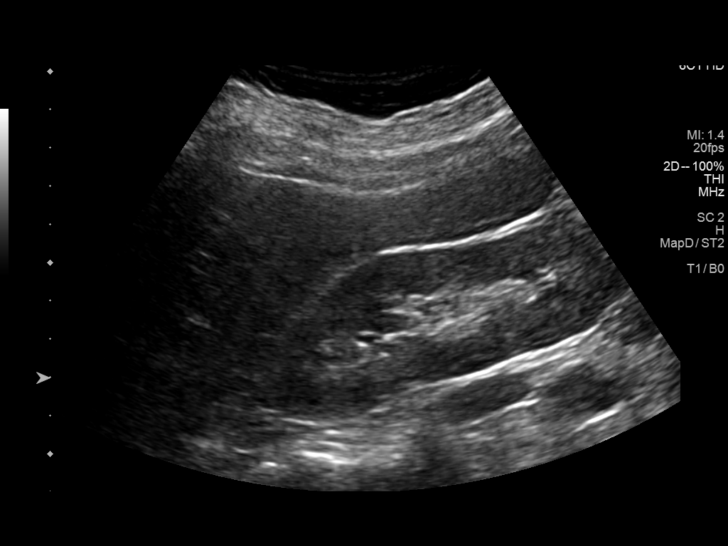
[im 3/31]
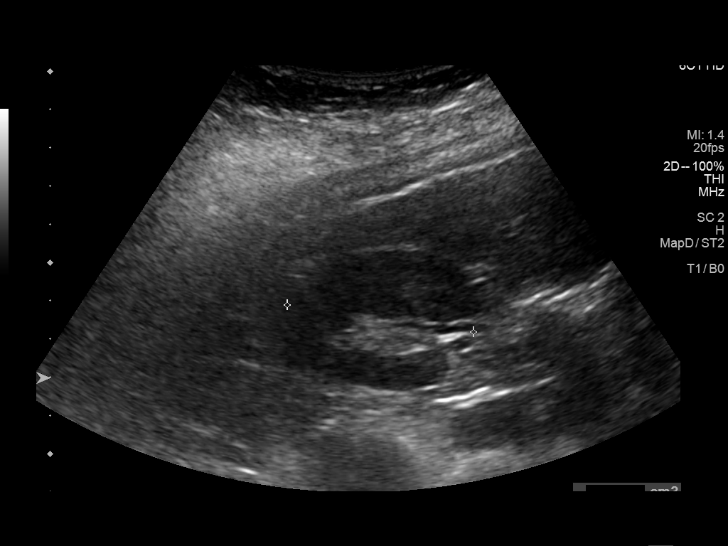
[im 6/31]
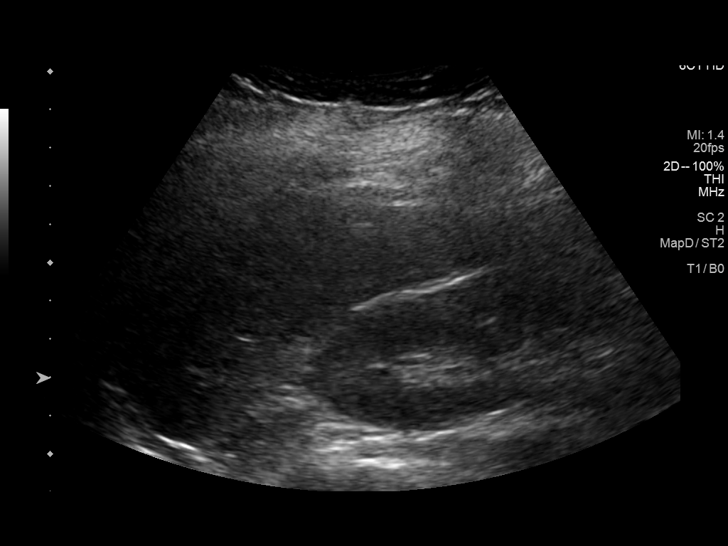
[im 8/31]
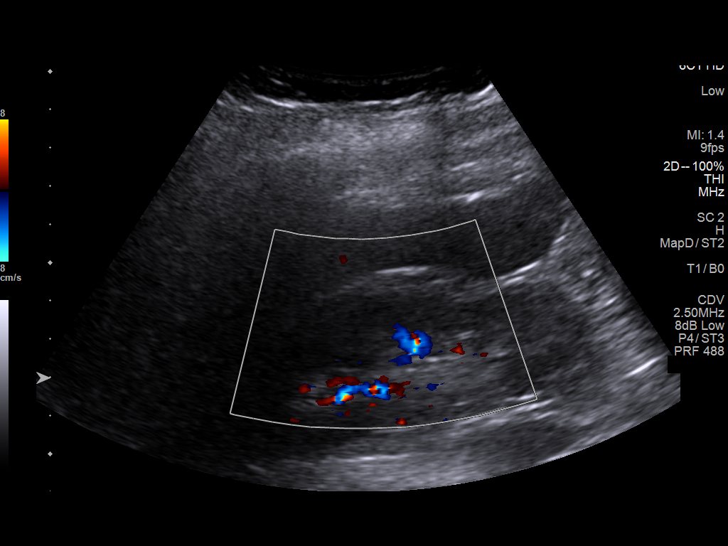
[im 11/31]
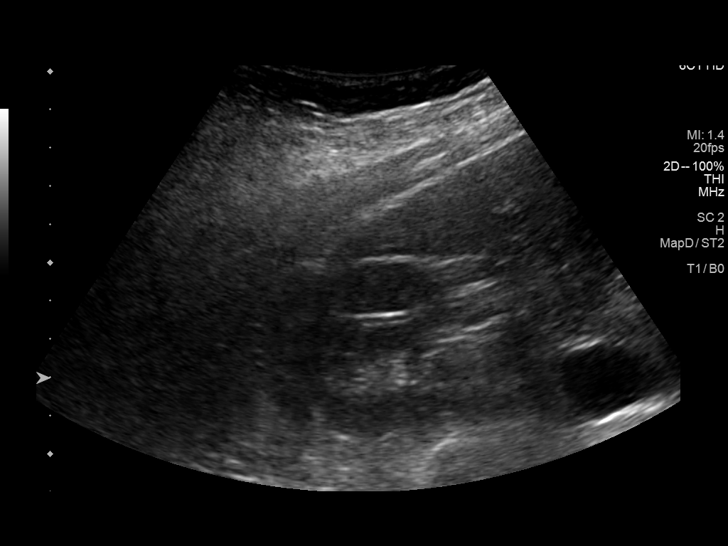
[im 12/31]
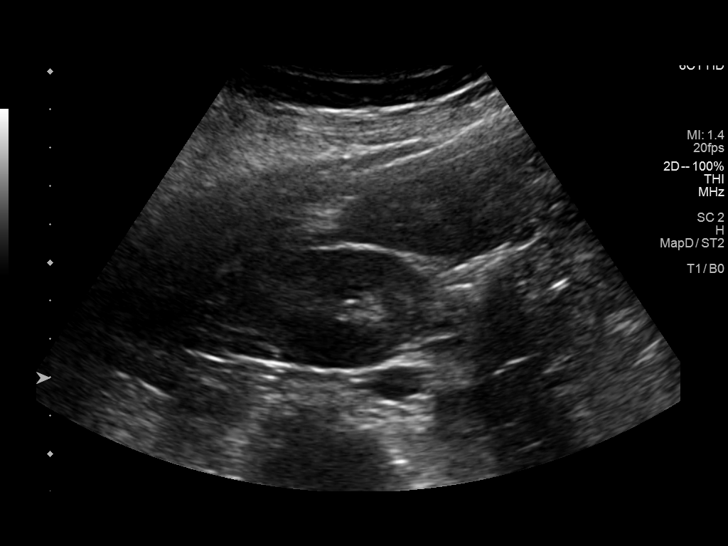
[im 14/31]
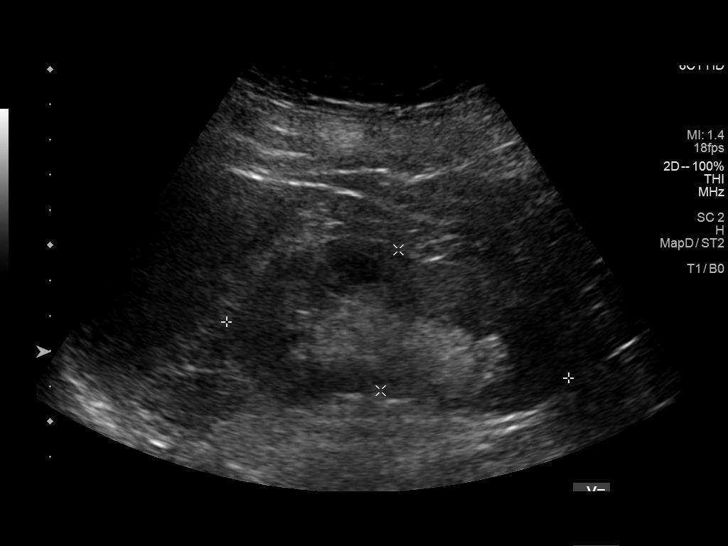
[im 17/31]
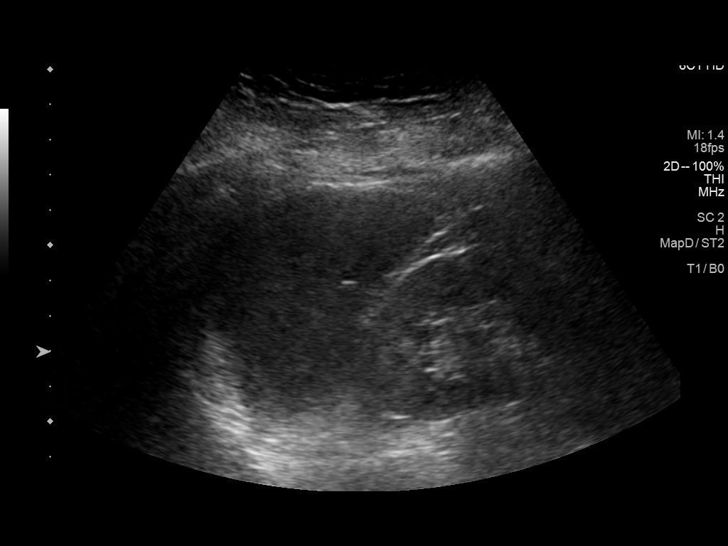
[im 19/31]
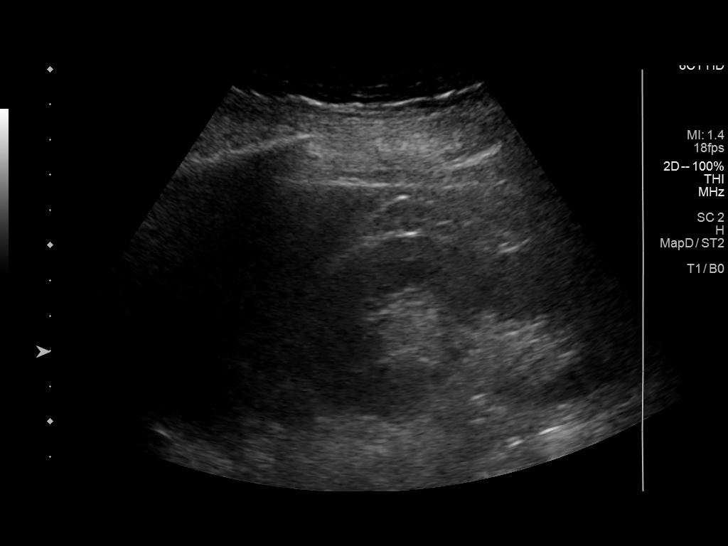
[im 21/31]
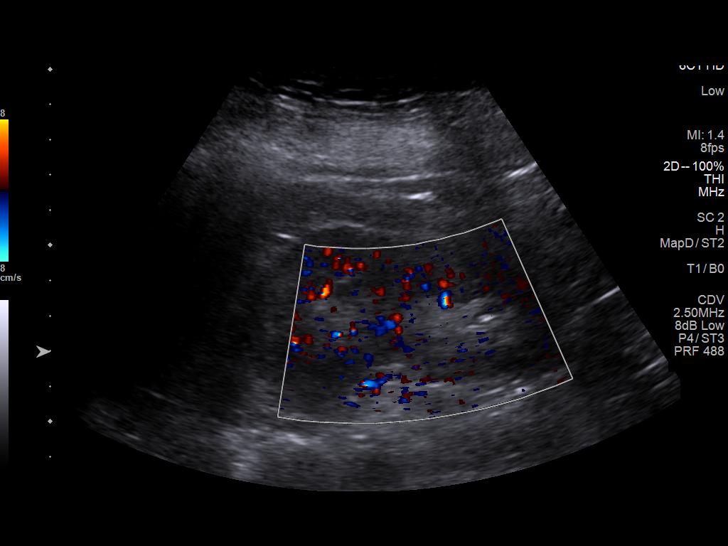
[im 23/31]
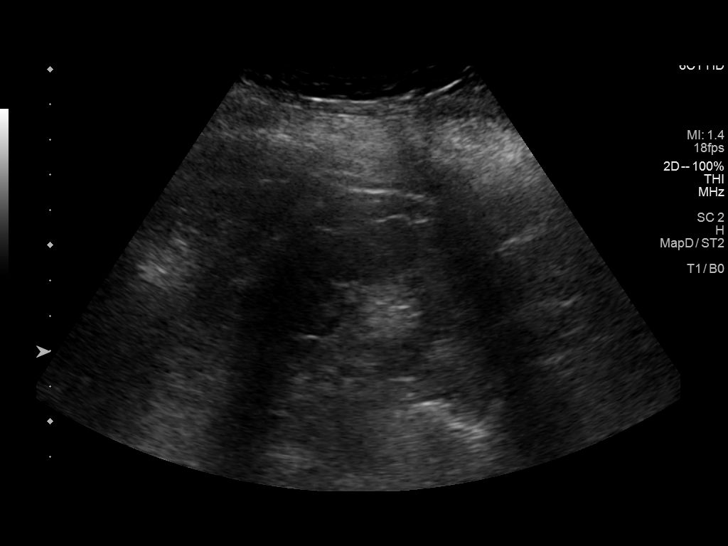
[im 26/31]
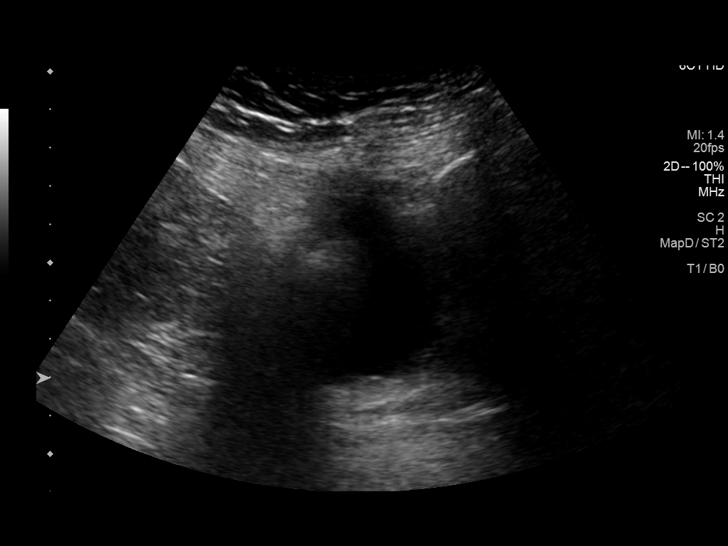
[im 28/31]
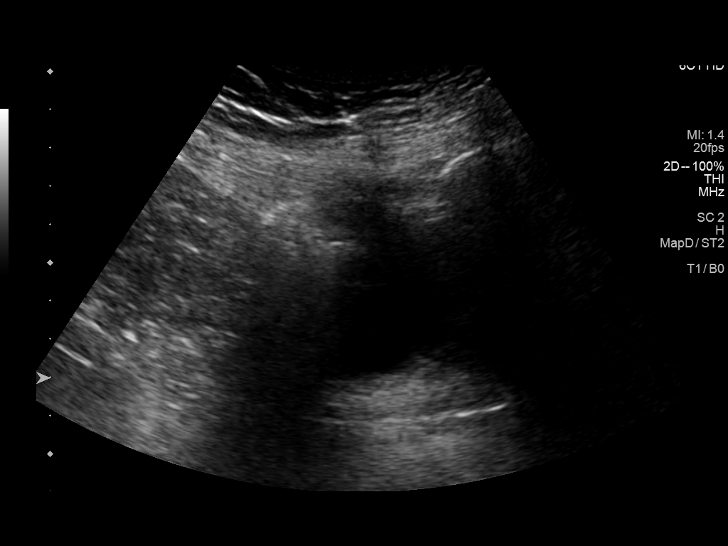
[im 31/31]
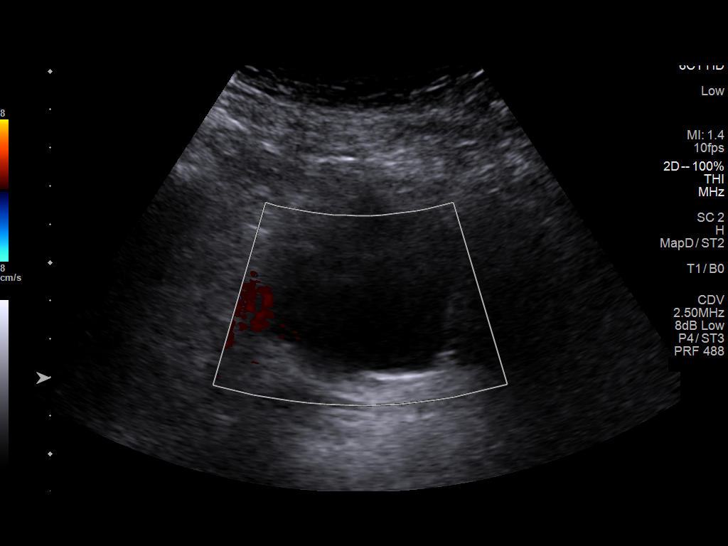

[14 of 25 positions shown; findings below may reference images not displayed]

FINDINGS: Right Kidney:

Renal measurements: 10.7 x 3.7 x 4.9 cm = volume: 103 mL .
Echogenicity within normal limits. No mass or hydronephrosis
visualized.

Left Kidney:

Renal measurements: 9.8 x 4.0 x 5.1 cm = volume: 106 mL.
Echogenicity within normal limits. No mass or hydronephrosis
visualized.

Bladder:

Appears normal for degree of bladder distention.
IMPRESSION: Normal renal ultrasound.

## 2020-06-04 DIAGNOSIS — M65841 Other synovitis and tenosynovitis, right hand: Secondary | ICD-10-CM | POA: Diagnosis not present

## 2020-06-04 DIAGNOSIS — Z4789 Encounter for other orthopedic aftercare: Secondary | ICD-10-CM | POA: Diagnosis not present

## 2020-06-04 DIAGNOSIS — G5603 Carpal tunnel syndrome, bilateral upper limbs: Secondary | ICD-10-CM | POA: Diagnosis not present

## 2020-06-06 ENCOUNTER — Other Ambulatory Visit: Payer: Self-pay | Admitting: Internal Medicine

## 2020-06-06 ENCOUNTER — Other Ambulatory Visit: Payer: Self-pay | Admitting: Student

## 2020-06-06 DIAGNOSIS — Z1231 Encounter for screening mammogram for malignant neoplasm of breast: Secondary | ICD-10-CM

## 2020-06-12 ENCOUNTER — Other Ambulatory Visit: Payer: Self-pay | Admitting: Radiation Oncology

## 2020-06-12 DIAGNOSIS — E11319 Type 2 diabetes mellitus with unspecified diabetic retinopathy without macular edema: Secondary | ICD-10-CM

## 2020-06-27 ENCOUNTER — Other Ambulatory Visit: Payer: Self-pay

## 2020-06-27 ENCOUNTER — Ambulatory Visit
Admission: RE | Admit: 2020-06-27 | Discharge: 2020-06-27 | Disposition: A | Discharge status: Home / Self Care | Payer: Medicare Other | Source: Ambulatory Visit | Attending: *Deleted | Admitting: *Deleted

## 2020-06-27 DIAGNOSIS — Z1231 Encounter for screening mammogram for malignant neoplasm of breast: Secondary | ICD-10-CM | POA: Diagnosis not present

## 2020-07-08 ENCOUNTER — Encounter: Payer: Self-pay | Admitting: Student

## 2020-07-08 ENCOUNTER — Ambulatory Visit (INDEPENDENT_AMBULATORY_CARE_PROVIDER_SITE_OTHER): Payer: Medicare Other | Admitting: Student

## 2020-07-08 VITALS — BP 138/69 | HR 63 | Temp 98.7°F | Ht <= 58 in | Wt 168.7 lb

## 2020-07-08 DIAGNOSIS — I10 Essential (primary) hypertension: Secondary | ICD-10-CM

## 2020-07-08 DIAGNOSIS — E11319 Type 2 diabetes mellitus with unspecified diabetic retinopathy without macular edema: Secondary | ICD-10-CM

## 2020-07-08 DIAGNOSIS — Z794 Long term (current) use of insulin: Secondary | ICD-10-CM

## 2020-07-08 DIAGNOSIS — M25532 Pain in left wrist: Secondary | ICD-10-CM

## 2020-07-08 DIAGNOSIS — M62838 Other muscle spasm: Secondary | ICD-10-CM

## 2020-07-08 DIAGNOSIS — K219 Gastro-esophageal reflux disease without esophagitis: Secondary | ICD-10-CM

## 2020-07-08 DIAGNOSIS — E1159 Type 2 diabetes mellitus with other circulatory complications: Secondary | ICD-10-CM

## 2020-07-08 DIAGNOSIS — R6889 Other general symptoms and signs: Secondary | ICD-10-CM | POA: Insufficient documentation

## 2020-07-08 LAB — GLUCOSE, CAPILLARY: Glucose-Capillary: 207 mg/dL — ABNORMAL HIGH (ref 70–99)

## 2020-07-08 LAB — POCT GLYCOSYLATED HEMOGLOBIN (HGB A1C): Hemoglobin A1C: 8.6 % — AB (ref 4.0–5.6)

## 2020-07-08 MED ORDER — VICTOZA 18 MG/3ML ~~LOC~~ SOPN: 1.8000 mg | PEN_INJECTOR | Freq: Every day | SUBCUTANEOUS | 11 refills | Status: DC

## 2020-07-08 MED ORDER — ATORVASTATIN CALCIUM 40 MG PO TABS: 40.0000 mg | ORAL_TABLET | Freq: Every day | ORAL | 3 refills | Status: DC

## 2020-07-08 MED ORDER — ACCU-CHEK GUIDE W/DEVICE KIT: 1.0000 | PACK | Freq: Three times a day (TID) | 0 refills | Status: DC

## 2020-07-08 MED ORDER — SENNOSIDES 8.6 MG PO TABS: 1.0000 | ORAL_TABLET | ORAL | 3 refills | Status: AC | PRN

## 2020-07-08 MED ORDER — MOMETASONE FUROATE 50 MCG/ACT NA SUSP: 2.0000 | Freq: Every day | NASAL | 12 refills | Status: DC

## 2020-07-08 MED ORDER — OLMESARTAN MEDOXOMIL 40 MG PO TABS: 40.0000 mg | ORAL_TABLET | Freq: Every day | ORAL | 3 refills | Status: DC

## 2020-07-08 MED ORDER — "INSULIN SYRINGE-NEEDLE U-100 31G X 15/64"" 0.5 ML MISC": 11 refills | Status: DC

## 2020-07-08 MED ORDER — ACCU-CHEK GUIDE VI STRP: ORAL_STRIP | 3 refills | Status: DC

## 2020-07-08 MED ORDER — INSULIN PEN NEEDLE 32G X 4 MM MISC: 11 refills | Status: DC

## 2020-07-08 MED ORDER — GABAPENTIN 100 MG PO CAPS: 100.0000 mg | ORAL_CAPSULE | Freq: Every day | ORAL | 3 refills | Status: DC

## 2020-07-08 MED ORDER — INSULIN GLARGINE 100 UNIT/ML ~~LOC~~ SOLN: SUBCUTANEOUS | 0 refills | Status: DC

## 2020-07-08 MED ORDER — INSULIN SYRINGES (DISPOSABLE) U-100 1 ML MISC: 2.0000 | Freq: Every day | 3 refills | Status: DC

## 2020-07-08 MED ORDER — OMEPRAZOLE 40 MG PO CPDR: 40.0000 mg | DELAYED_RELEASE_CAPSULE | Freq: Every day | ORAL | 4 refills | Status: DC

## 2020-07-08 NOTE — Assessment & Plan Note (Signed)
Well controlled with omeprazole 40mg .  Assessment: -continue current meds

## 2020-07-08 NOTE — Progress Notes (Signed)
CC: Forgetfulness and muscle spasms  HPI:  [name] is a 64 y.o. female with a past medical history stated below and presents today for follow up visit and c/o forgetfulness and muscle spasms. Please see problem based assessment and plan for additional details.   Past Medical History:  Diagnosis Date  . Carpal tunnel syndrome, bilateral    confirmed on nerve conduction studies  . Chronic hepatitis C ([place])    considered cured 06/2016 post retroviral therapy   . Diabetes mellitus type 2, uncontrolled ([place])   . Diabetic peripheral neuropathy ([place])   . GERD (gastroesophageal reflux disease)   . History of endometriosis   . History of Helicobacter infection 11/07  . Hyperlipidemia   . Liver fibrosis 02/17/2016   F2/3    Current Outpatient Medications on File Prior to Visit  Medication Sig Dispense Refill  . ACCU-CHEK FASTCLIX LANCETS MISC Use to check blood sugar up to 3 times a day 306 each 3  . Wheat Dextrin (BENEFIBER PO) Take 1 tablet by mouth daily as needed (constipation).     No current facility-administered medications on file prior to visit.   Family History  Problem Relation Age of Onset  . Cancer Mother        lung  . Kidney disease Mother   . Lung cancer Maternal Uncle   . Breast cancer Maternal Grandmother   . Liver cancer Maternal Grandfather   . Heart disease Brother   . Depression Brother   . Diabetes Father   . Dementia Father   . Colon cancer Neg Hx    Social History   Socioeconomic History  . Marital status: Single    Spouse name: Not on file  . Number of children: 0  . Years of education: 39  . Highest education level: Not on file  Occupational History  . Occupation: Forensic psychologist: [employer]  . Occupation: Disabled  Tobacco Use  . Smoking status: Never Smoker  . Smokeless tobacco: Never Used  Vaping Use  . Vaping Use: Never used  Substance and Sexual Activity  . Alcohol use: No    Alcohol/week: 0.0 standard drinks  . Drug  use: No  . Sexual activity: Yes    Birth control/protection: None    Comment: 1 committed partner  Other Topics Concern  . Not on file  Social History Narrative   Current Social History 08/28/2019        Patient lives alone in a 7th floor apartment with elevator. There are not steps up to the entrance the patient uses.       Patient's method of transportation is via family member (mom or friend).      The highest level of education was high school diploma.      The patient currently disabled.      Identified important Relationships are "My mother"       Pets : None       Interests / Fun: "TV, movies"       Current Stressors: "People, my health"       Religious / Personal Beliefs: "Baptist"       L. Ducatte, RN, BSN       Social Determinants of Health   Financial Resource Strain:   . Difficulty of Paying Living Expenses:   Food Insecurity:   . Worried About Charity fundraiser in the Last Year:   . Arboriculturist in the Last Year:   [employer]  Needs:   . Lack of Transportation (Medical):   Marland Kitchen Lack of Transportation (Non-Medical):   Physical Activity:   . Days of Exercise per Week:   . Minutes of Exercise per Session:   Stress:   . Feeling of Stress :   Social Connections:   . Frequency of Communication with Friends and Family:   . Frequency of Social Gatherings with Friends and Family:   . Attends Religious Services:   . Active Member of Clubs or Organizations:   . Attends Archivist Meetings:   Marland Kitchen Marital Status:   Intimate Partner Violence:   . Fear of Current or Ex-Partner:   . Emotionally Abused:   Marland Kitchen Physically Abused:   . Sexually Abused:     Review of Systems:   ROS negative aside from what is listed in assessment and plan.  Physical Exam:  Vitals:   07/08/20 1445  BP: 138/69  Pulse: 63  Temp: 98.7 F (37.1 C)  TempSrc: Oral  SpO2: 100%  Weight: 168 lb 11.2 oz (76.5 kg)  Height: 4\' 10"  (1.473 m)   Physical  Exam Constitutional:      Appearance: Normal appearance. She is obese.  HENT:     Head: Normocephalic and atraumatic.  Eyes:     Extraocular Movements: Extraocular movements intact.     Conjunctiva/sclera: Conjunctivae normal.     Pupils: Pupils are equal, round, and reactive to light.  Cardiovascular:     Rate and Rhythm: Normal rate and regular rhythm.     Heart sounds: Normal heart sounds. No murmur heard.  No friction rub. No gallop.   Pulmonary:     Effort: Pulmonary effort is normal.     Breath sounds: Normal breath sounds. No stridor. No wheezing or rhonchi.  Abdominal:     General: Bowel sounds are normal. There is no distension.     Palpations: Abdomen is soft.     Tenderness: There is no abdominal tenderness. There is no guarding or rebound.  Musculoskeletal:        General: No swelling.  Skin:    General: Skin is warm and dry.  Neurological:     General: No focal deficit present.     Mental Status: She is alert and oriented to person, place, and time.  Psychiatric:        Mood and Affect: Mood normal.        Behavior: Behavior normal.        Thought Content: Thought content normal.        Judgment: Judgment normal.     Assessment & Plan:   See Encounters Tab for problem based charting.  Patient seen with Dr. Daryll Drown

## 2020-07-08 NOTE — Assessment & Plan Note (Signed)
Patient still having muscle spasms intermittently throughout the day. Reports that she is not a big fan of drinking water. Counseled her to start drinking more water. Also informed to begin taking Vitamin B complex daily. Will follow up at next visit.

## 2020-07-08 NOTE — Assessment & Plan Note (Signed)
Patient seen today for HTN. BP in office at 138/69. Stable.   Assessment: -Continue benicar 40mg  daily -continue diltiazem 120mg  daily

## 2020-07-08 NOTE — Assessment & Plan Note (Signed)
-  HbA1c of 8.6 today (increased from 6.7) -Reports not having a good diet and exercise regimen due to pandemic. Counseled to incorporate a healthier diet and exercise into lifestyle. -Increased home victoza dose from 1.2mg  daily to 1.8mg  daily -Lantus kept at 32 units daily -follow up in 3 months for HbA1c check

## 2020-07-08 NOTE — Patient Instructions (Signed)
Nancy [name], it was a pleasure seeing you today for your follow up visit. You were concerned about having some mild memory loss and we performed a test for Dementia. Your results were reassuring for [place] and are not concerning for significant memory loss. However, we can follow up on this again during your next visit to see if there are any changes.  Your other concern was about your muscle spasms. We are recommending that you start taking Vitamin B complex, which is something you can get over-the-counter, and take one pill every day. We are also recommending to increase your daily water intake.  We also discussed that your 44-month glucose levels were increased from your last visit. We discussed that it is very important to start exercising regularly and to improve your diet because both of these can help your diabetes and high blood pressure stay better controlled. We have also increased your Victoza dose to 1.8mg  every morning. Please take this as directed. We will follow up with this at your next visit in 3 months.  I have refilled your medications for you.  Once again, it was a pleasure seeing you.

## 2020-07-08 NOTE — Assessment & Plan Note (Signed)
Patient reports having a problem with forgetfulness. For example, reports telling something to her family and then not remembering what she told them a few hours later, and forgets where she placed something. Reports that it started about a year ago but it has been worsening. Reports family history of Alzheimer's Disease in maternal grandmother and her aunt. However, she reports that it is not significantly impacting her daily activities, just kind of annoying. MOCA score of 26/30, with a likely mild cognitive impairment in delayed recall. Will reassess at follow up visit in 3 months to check for decline in cognitive function.

## 2020-07-09 NOTE — Progress Notes (Signed)
Internal Medicine Clinic Attending  I saw and evaluated the patient.  I personally confirmed the key portions of the history and exam documented by Dr. Jinwala and I reviewed pertinent patient test results.  The assessment, diagnosis, and plan were formulated together and I agree with the documentation in the resident's note.  

## 2020-07-12 ENCOUNTER — Telehealth: Payer: Self-pay | Admitting: *Deleted

## 2020-07-12 ENCOUNTER — Other Ambulatory Visit: Payer: Self-pay | Admitting: Student

## 2020-07-12 DIAGNOSIS — K219 Gastro-esophageal reflux disease without esophagitis: Secondary | ICD-10-CM

## 2020-07-12 DIAGNOSIS — Z760 Encounter for issue of repeat prescription: Secondary | ICD-10-CM

## 2020-07-12 NOTE — Telephone Encounter (Signed)
Pharmacy ask for clarification on senna script How often? How many days supply is this?

## 2020-07-30 DIAGNOSIS — M65841 Other synovitis and tenosynovitis, right hand: Secondary | ICD-10-CM | POA: Diagnosis not present

## 2020-07-30 DIAGNOSIS — G5601 Carpal tunnel syndrome, right upper limb: Secondary | ICD-10-CM | POA: Diagnosis not present

## 2020-07-30 DIAGNOSIS — G5603 Carpal tunnel syndrome, bilateral upper limbs: Secondary | ICD-10-CM | POA: Diagnosis not present

## 2020-08-06 ENCOUNTER — Telehealth: Payer: Self-pay | Admitting: Student

## 2020-08-06 NOTE — Telephone Encounter (Signed)
Return pt's call - stated she had an episode of muscle spasms last night thru this morning. Stated she's taking Vit B complex as suggested which helped at first, but not now. Stated it was so severe she thought about calling EMS; worse experience - they were all over her body.  Requesting something else. Thanks

## 2020-08-06 NOTE — Telephone Encounter (Signed)
Pls contact pt regarding muscle spasms [phone]

## 2020-08-07 DIAGNOSIS — G5602 Carpal tunnel syndrome, left upper limb: Secondary | ICD-10-CM | POA: Diagnosis not present

## 2020-08-07 DIAGNOSIS — R200 Anesthesia of skin: Secondary | ICD-10-CM | POA: Diagnosis not present

## 2020-08-09 DIAGNOSIS — N1832 Chronic kidney disease, stage 3b: Secondary | ICD-10-CM | POA: Diagnosis not present

## 2020-08-09 NOTE — Telephone Encounter (Signed)
Any openings for at least a televisit appointment?

## 2020-08-09 NOTE — Telephone Encounter (Signed)
No telehealth appts until next week. Called pt - stated the mus spasms have "eased up"but she's very sore. Telehealth explained / pt agreed - appt scheduled Wed 9/8 @ 1515 PM - pt instructed to have her cell phone with her.

## 2020-08-13 DIAGNOSIS — E1122 Type 2 diabetes mellitus with diabetic chronic kidney disease: Secondary | ICD-10-CM | POA: Diagnosis not present

## 2020-08-13 DIAGNOSIS — R801 Persistent proteinuria, unspecified: Secondary | ICD-10-CM | POA: Diagnosis not present

## 2020-08-13 DIAGNOSIS — I129 Hypertensive chronic kidney disease with stage 1 through stage 4 chronic kidney disease, or unspecified chronic kidney disease: Secondary | ICD-10-CM | POA: Diagnosis not present

## 2020-08-13 DIAGNOSIS — N1832 Chronic kidney disease, stage 3b: Secondary | ICD-10-CM | POA: Diagnosis not present

## 2020-08-13 DIAGNOSIS — E559 Vitamin D deficiency, unspecified: Secondary | ICD-10-CM | POA: Diagnosis not present

## 2020-08-14 ENCOUNTER — Encounter: Payer: Medicare Other | Admitting: Internal Medicine

## 2020-08-14 ENCOUNTER — Other Ambulatory Visit: Payer: Self-pay

## 2020-08-23 ENCOUNTER — Telehealth: Payer: Self-pay | Admitting: Student

## 2020-08-23 NOTE — Telephone Encounter (Signed)
Pt would like to know what she can take for a headache with her kidney disease [phone]

## 2020-08-23 NOTE — Telephone Encounter (Signed)
Reviewed patient's records. Contacted her by phone to discuss medication options for headaches. She states that her headache from earlier today is improved. I advised her that Tylenol is a good option to take given her stable liver function with chronic kidney disease. She has Tylenol at home. I advised her that taking this medication is appropriate. She has no further questions.

## 2020-09-03 DIAGNOSIS — E113213 Type 2 diabetes mellitus with mild nonproliferative diabetic retinopathy with macular edema, bilateral: Secondary | ICD-10-CM | POA: Diagnosis not present

## 2020-09-03 LAB — HM DIABETES EYE EXAM

## 2020-09-11 DIAGNOSIS — M65841 Other synovitis and tenosynovitis, right hand: Secondary | ICD-10-CM | POA: Diagnosis not present

## 2020-09-11 DIAGNOSIS — G5603 Carpal tunnel syndrome, bilateral upper limbs: Secondary | ICD-10-CM | POA: Diagnosis not present

## 2020-09-23 ENCOUNTER — Encounter: Payer: Self-pay | Admitting: *Deleted

## 2020-10-01 ENCOUNTER — Telehealth: Payer: Self-pay

## 2020-10-01 NOTE — Telephone Encounter (Signed)
Great. Thank you.

## 2020-10-01 NOTE — Telephone Encounter (Signed)
If this is not working, then she will need a subsequent appointment to discuss alterative treatments.

## 2020-10-01 NOTE — Telephone Encounter (Signed)
Called pt -stated she has tried everything drinking water,B vitamins,stretch, avoiding alcohol and caffeine. Agreeable to scheduling an appt - she cannot come tomorrow.  Appt Thurs 10/28 @ 1515PM.

## 2020-10-01 NOTE — Telephone Encounter (Signed)
I agree with Dr. Allyson Sabal. She should try staying hydrated with 64 ounces of water daily, B complex vitamin (make sure the vitamin has B6), avoid caffeine and alcohol, and gentle stretching of the affected muscles.

## 2020-10-01 NOTE — Telephone Encounter (Signed)
He will need a follow up appointment and a new set of labs to address this issue.

## 2020-10-01 NOTE — Telephone Encounter (Signed)
Pt wants to know if there is any medicine that she can take for muscle spasms [phone]

## 2020-10-02 ENCOUNTER — Encounter: Payer: Medicare Other | Admitting: Internal Medicine

## 2020-10-03 ENCOUNTER — Other Ambulatory Visit: Payer: Self-pay

## 2020-10-03 ENCOUNTER — Ambulatory Visit (INDEPENDENT_AMBULATORY_CARE_PROVIDER_SITE_OTHER): Payer: Medicare Other | Admitting: Internal Medicine

## 2020-10-03 VITALS — BP 159/79 | HR 66 | Temp 98.4°F | Wt 168.6 lb

## 2020-10-03 DIAGNOSIS — Z794 Long term (current) use of insulin: Secondary | ICD-10-CM

## 2020-10-03 DIAGNOSIS — E1122 Type 2 diabetes mellitus with diabetic chronic kidney disease: Secondary | ICD-10-CM | POA: Diagnosis not present

## 2020-10-03 DIAGNOSIS — E1159 Type 2 diabetes mellitus with other circulatory complications: Secondary | ICD-10-CM

## 2020-10-03 DIAGNOSIS — N183 Chronic kidney disease, stage 3 unspecified: Secondary | ICD-10-CM

## 2020-10-03 DIAGNOSIS — I152 Hypertension secondary to endocrine disorders: Secondary | ICD-10-CM

## 2020-10-03 DIAGNOSIS — E11319 Type 2 diabetes mellitus with unspecified diabetic retinopathy without macular edema: Secondary | ICD-10-CM

## 2020-10-03 LAB — POCT GLYCOSYLATED HEMOGLOBIN (HGB A1C): Hemoglobin A1C: 8.4 % — AB (ref 4.0–5.6)

## 2020-10-03 LAB — GLUCOSE, CAPILLARY: Glucose-Capillary: 140 mg/dL — ABNORMAL HIGH (ref 70–99)

## 2020-10-03 NOTE — Progress Notes (Signed)
CC: DM  HPI:  Nancy [name] is a 64 y.o. female with a past medical history stated below and presents today for DM. Please see problem based assessment and plan for additional details.  Past Medical History:  Diagnosis Date   Carpal tunnel syndrome, bilateral    confirmed on nerve conduction studies   Chronic hepatitis C ([place])    considered cured 06/2016 post retroviral therapy    Diabetes mellitus type 2, uncontrolled ([place])    Diabetic peripheral neuropathy ([facility])    GERD (gastroesophageal reflux disease)    History of endometriosis    History of Helicobacter infection 11/07   Hyperlipidemia    Liver fibrosis 02/17/2016   F2/3     Current Outpatient Medications on File Prior to Visit  Medication Sig Dispense Refill   ACCU-CHEK FASTCLIX LANCETS MISC Use to check blood sugar up to 3 times a day 306 each 3   atorvastatin (LIPITOR) 40 MG tablet Take 1 tablet (40 mg total) by mouth daily. 90 tablet 3   Blood Glucose Monitoring Suppl (ACCU-CHEK GUIDE) w/Device KIT 1 each by Does not apply route 3 (three) times daily. Check blood sugar 3 times a day 1 kit 0   gabapentin (NEURONTIN) 100 MG capsule Take 1 capsule (100 mg total) by mouth at bedtime. 30 capsule 3   glucose blood (ACCU-CHEK GUIDE) test strip Check blood sugar 3 times a day 300 each 3   insulin glargine (LANTUS) 100 UNIT/ML injection INJECT 32 UNITS INTO THE SKIN EVERY MORNING WHEN YOU WAKE UP 40 mL 0   Insulin Pen Needle 32G X 4 MM MISC Please use with liraglutide.  you will take 1.2 mg daily. 100 each 11   Insulin Syringe-Needle U-100 31G X 15/64" 0.5 ML MISC Use to inject insulin one time a day 100 each 11   Insulin Syringes, Disposable, U-100 1 ML MISC 2 Syringes by Does not apply route daily. Use to inject insulin into the skin 1 time daily. diag code E11.9. Insulin dependent 100 each 3   mometasone (NASONEX) 50 MCG/ACT nasal spray Place 2 sprays into the nose daily. 17 g 12   olmesartan (BENICAR)  40 MG tablet Take 1 tablet (40 mg total) by mouth daily. 90 tablet 3   omeprazole (PRILOSEC) 40 MG capsule Take 1 capsule (40 mg total) by mouth daily. 90 capsule 4   senna (SENOKOT) 8.6 MG tablet Take 1 tablet (8.6 mg total) by mouth as needed for constipation. 60 tablet 3   Wheat Dextrin (BENEFIBER PO) Take 1 tablet by mouth daily as needed (constipation).     No current facility-administered medications on file prior to visit.    Family History  Problem Relation Age of Onset   Cancer Mother        lung   Kidney disease Mother    Lung cancer Maternal Uncle    Breast cancer Maternal Grandmother    Liver cancer Maternal Grandfather    Heart disease Brother    Depression Brother    Diabetes Father    Dementia Father    Colon cancer Neg Hx     Social History   Socioeconomic History   Marital status: Single    Spouse name: Not on file   Number of children: 0   Years of education: 12   Highest education level: Not on file  Occupational History   Occupation: CNA    Employer: [employer]   Occupation: Disabled  Tobacco Use   Smoking status:  Never Smoker   Smokeless tobacco: Never Used  Vaping Use   Vaping Use: Never used  Substance and Sexual Activity   Alcohol use: No    Alcohol/week: 0.0 standard drinks   Drug use: No   Sexual activity: Yes    Birth control/protection: None    Comment: 1 committed partner  Other Topics Concern   Not on file  Social History Narrative   Current Social History 08/28/2019        Patient lives alone in a 7th floor apartment with elevator. There are not steps up to the entrance the patient uses.       Patient's method of transportation is via family member (mom or friend).      The highest level of education was high school diploma.      The patient currently disabled.      Identified important Relationships are "My mother"       Pets : None       Interests / Fun: "TV, movies"       Current Stressors:  "People, my health"       Religious / Personal Beliefs: "Baptist"       L. Ducatte, RN, BSN       Social Determinants of Health   Financial Resource Strain:    Difficulty of Paying Living Expenses: Not on file  Food Insecurity:    Worried About Charity fundraiser in the Last Year: Not on file   [employer] of Food in the Last Year: Not on file  Transportation Needs:    Lack of Transportation (Medical): Not on file   Lack of Transportation (Non-Medical): Not on file  Physical Activity:    Days of Exercise per Week: Not on file   Minutes of Exercise per Session: Not on file  Stress:    Feeling of Stress : Not on file  Social Connections:    Frequency of Communication with Friends and Family: Not on file   Frequency of Social Gatherings with Friends and Family: Not on file   Attends Religious Services: Not on file   Active Member of Clubs or Organizations: Not on file   Attends Archivist Meetings: Not on file   Marital Status: Not on file  Intimate Partner Violence:    Fear of Current or Ex-Partner: Not on file   Emotionally Abused: Not on file   Physically Abused: Not on file   Sexually Abused: Not on file    Review of Systems: ROS negative except for what is noted on the assessment and plan.  Vitals:   10/03/20 1518  BP: (!) 159/79  Pulse: 66  Temp: 98.4 F (36.9 C)  TempSrc: Oral  SpO2: 99%  Weight: 168 lb 9.6 oz (76.5 kg)     Physical Exam: Physical Exam Constitutional:      Appearance: Normal appearance.  HENT:     Head: Normocephalic and atraumatic.  Cardiovascular:     Rate and Rhythm: Normal rate.     Pulses: Normal pulses.     Heart sounds: Normal heart sounds.  Pulmonary:     Effort: Pulmonary effort is normal.     Breath sounds: Normal breath sounds.  Musculoskeletal:        General: Normal range of motion.     Cervical back: Normal range of motion.     Right lower leg: No edema.     Left lower leg: No edema.    Skin:    General:  Skin is warm and dry.  Neurological:     Mental Status: She is alert and oriented to person, place, and time. Mental status is at baseline.  Psychiatric:        Mood and Affect: Mood normal.      Assessment & Plan:   See Encounters Tab for problem based charting.  Patient discussed with Dr. Hilbert Odor, D.O. [facility], PGY-2 Pager: [phone], Phone: [phone] Date 10/05/2020 Time 10:03 PM

## 2020-10-03 NOTE — Patient Instructions (Signed)
Thank you, [name] for allowing [place] to provide your care today. Today we discussed Diabetes, muscle spasm memory loss.    I have ordered the following labs for you:   Lab Orders     BMP8+Anion Gap     Glucose, capillary     POC Hbg A1C   Tests ordered today:  none  Referrals ordered today:   Referral Orders  No referral(s) requested today     I have ordered the following medication/changed the following medications:   Stop the following medications: Medications Discontinued During This Encounter  Medication Reason  . liraglutide (VICTOZA) 18 MG/3ML SOPN Discontinued by provider     Start the following medications: 1. Tylenol 500 mg every 6 hours for pain,  2, Dapagloflozin (for diabetes)   Follow up: 3 months    Remember:   Should you have any questions or concerns please call the internal medicine clinic at [phone].     Nancy [name], D.O. [place]

## 2020-10-04 LAB — BMP8+ANION GAP
Anion Gap: 12 mmol/L (ref 10.0–18.0)
BUN/Creatinine Ratio: 8 — ABNORMAL LOW (ref 12–28)
BUN: 11 mg/dL (ref 8–27)
CO2: 26 mmol/L (ref 20–29)
Calcium: 9.2 mg/dL (ref 8.7–10.3)
Chloride: 102 mmol/L (ref 96–106)
Creatinine, Ser: 1.44 mg/dL — ABNORMAL HIGH (ref 0.57–1.00)
GFR calc Af Amer: 45 mL/min/{1.73_m2} — ABNORMAL LOW (ref >59)
GFR calc non Af Amer: 39 mL/min/{1.73_m2} — ABNORMAL LOW (ref >59)
Glucose: 136 mg/dL — ABNORMAL HIGH (ref 65–99)
Potassium: 3.9 mmol/L (ref 3.5–5.2)
Sodium: 140 mmol/L (ref 134–144)

## 2020-10-05 ENCOUNTER — Encounter: Payer: Self-pay | Admitting: Internal Medicine

## 2020-10-05 MED ORDER — DAPAGLIFLOZIN PROPANEDIOL 5 MG PO TABS: 5.0000 mg | ORAL_TABLET | Freq: Every day | ORAL | 2 refills | Status: DC

## 2020-10-05 NOTE — Assessment & Plan Note (Signed)
Patient presents for reevaluation of HTN. Her blood pressure is 159/79 on olmesartan 40 mg. Patient denies any side effects of the medication. She will likely need to start a seconda antihypertensive medication in the near future. I will check kidney function and electrolytes today.    Plan: - Reevaluate for second antihypertensive medication at next visit.  - BMP today

## 2020-10-05 NOTE — Assessment & Plan Note (Addendum)
Patient has DM with an A1c of 8.6 on Lantus 32 units nightly. She reports that her morning fasting glucoses are around 150. She sa Dr. Virgina Evener in October and does have a history of retinopathy. She was on Victoza but recently stopped due to GI side effects. Patient would benefit form SGLT-2 inhibitor due to uncontrolled DM and CKD.   Plan: - Continue Lantus 32 units nightly - Start Dapagloflozin  - A1c goal of < 7.

## 2020-10-06 ENCOUNTER — Other Ambulatory Visit: Payer: Self-pay

## 2020-10-06 ENCOUNTER — Emergency Department (HOSPITAL_COMMUNITY)
Admission: EM | Admit: 2020-10-06 | Discharge: 2020-10-06 | Disposition: A | Discharge status: Home / Self Care | Payer: Medicare Other | Attending: Emergency Medicine | Admitting: Emergency Medicine

## 2020-10-06 ENCOUNTER — Emergency Department (HOSPITAL_COMMUNITY): Payer: Medicare Other

## 2020-10-06 DIAGNOSIS — I129 Hypertensive chronic kidney disease with stage 1 through stage 4 chronic kidney disease, or unspecified chronic kidney disease: Secondary | ICD-10-CM | POA: Diagnosis not present

## 2020-10-06 DIAGNOSIS — E1122 Type 2 diabetes mellitus with diabetic chronic kidney disease: Secondary | ICD-10-CM | POA: Diagnosis not present

## 2020-10-06 DIAGNOSIS — I700 Atherosclerosis of aorta: Secondary | ICD-10-CM | POA: Diagnosis not present

## 2020-10-06 DIAGNOSIS — E1140 Type 2 diabetes mellitus with diabetic neuropathy, unspecified: Secondary | ICD-10-CM | POA: Diagnosis not present

## 2020-10-06 DIAGNOSIS — M5441 Lumbago with sciatica, right side: Secondary | ICD-10-CM

## 2020-10-06 DIAGNOSIS — Z794 Long term (current) use of insulin: Secondary | ICD-10-CM | POA: Diagnosis not present

## 2020-10-06 DIAGNOSIS — I708 Atherosclerosis of other arteries: Secondary | ICD-10-CM | POA: Diagnosis not present

## 2020-10-06 DIAGNOSIS — Z79899 Other long term (current) drug therapy: Secondary | ICD-10-CM | POA: Diagnosis not present

## 2020-10-06 DIAGNOSIS — N200 Calculus of kidney: Secondary | ICD-10-CM | POA: Diagnosis not present

## 2020-10-06 DIAGNOSIS — M545 Low back pain, unspecified: Secondary | ICD-10-CM | POA: Diagnosis not present

## 2020-10-06 DIAGNOSIS — Z91040 Latex allergy status: Secondary | ICD-10-CM | POA: Diagnosis not present

## 2020-10-06 DIAGNOSIS — N183 Chronic kidney disease, stage 3 unspecified: Secondary | ICD-10-CM | POA: Insufficient documentation

## 2020-10-06 DIAGNOSIS — I70201 Unspecified atherosclerosis of native arteries of extremities, right leg: Secondary | ICD-10-CM | POA: Diagnosis not present

## 2020-10-06 DIAGNOSIS — M1611 Unilateral primary osteoarthritis, right hip: Secondary | ICD-10-CM | POA: Diagnosis not present

## 2020-10-06 DIAGNOSIS — E11319 Type 2 diabetes mellitus with unspecified diabetic retinopathy without macular edema: Secondary | ICD-10-CM | POA: Insufficient documentation

## 2020-10-06 DIAGNOSIS — M5431 Sciatica, right side: Secondary | ICD-10-CM | POA: Insufficient documentation

## 2020-10-06 DIAGNOSIS — R1030 Lower abdominal pain, unspecified: Secondary | ICD-10-CM | POA: Diagnosis not present

## 2020-10-06 LAB — URINALYSIS, ROUTINE W REFLEX MICROSCOPIC
Bacteria, UA: NONE SEEN
Bilirubin Urine: NEGATIVE
Glucose, UA: 50 mg/dL — AB
Hgb urine dipstick: NEGATIVE
Ketones, ur: NEGATIVE mg/dL
Leukocytes,Ua: NEGATIVE
Nitrite: NEGATIVE
Protein, ur: >=300 mg/dL — AB
Specific Gravity, Urine: 1.015 (ref 1.005–1.030)
pH: 7 (ref 5.0–8.0)

## 2020-10-06 LAB — I-STAT CHEM 8, ED
BUN: 25 mg/dL — ABNORMAL HIGH (ref 8–23)
Calcium, Ion: 1.17 mmol/L (ref 1.15–1.40)
Chloride: 103 mmol/L (ref 98–111)
Creatinine, Ser: 1.8 mg/dL — ABNORMAL HIGH (ref 0.44–1.00)
Glucose, Bld: 107 mg/dL — ABNORMAL HIGH (ref 70–99)
HCT: 33 % — ABNORMAL LOW (ref 36.0–46.0)
Hemoglobin: 11.2 g/dL — ABNORMAL LOW (ref 12.0–15.0)
Potassium: 3.4 mmol/L — ABNORMAL LOW (ref 3.5–5.1)
Sodium: 140 mmol/L (ref 135–145)
TCO2: 27 mmol/L (ref 22–32)

## 2020-10-06 MED ORDER — METHOCARBAMOL 750 MG PO TABS: 750.0000 mg | ORAL_TABLET | Freq: Two times a day (BID) | ORAL | 0 refills | Status: DC | PRN

## 2020-10-06 MED ORDER — OXYCODONE-ACETAMINOPHEN 5-325 MG PO TABS
1.0000 | ORAL_TABLET | Freq: Once | ORAL | Status: AC
  Administered 2020-10-06: 1 via ORAL
  Filled 2020-10-06: qty 1

## 2020-10-06 MED ORDER — HYDROMORPHONE HCL 1 MG/ML IJ SOLN
1.0000 mg | Freq: Once | INTRAMUSCULAR | Status: AC
  Administered 2020-10-06: 1 mg via INTRAVENOUS
  Filled 2020-10-06: qty 1

## 2020-10-06 MED ORDER — FENTANYL CITRATE (PF) 100 MCG/2ML IJ SOLN
50.0000 ug | Freq: Once | INTRAMUSCULAR | Status: AC
  Administered 2020-10-06: 50 ug via INTRAMUSCULAR
  Filled 2020-10-06: qty 2

## 2020-10-06 MED ORDER — LIDOCAINE 5 % EX PTCH
1.0000 | MEDICATED_PATCH | Freq: Once | CUTANEOUS | Status: DC
  Administered 2020-10-06: 1 via TRANSDERMAL
  Filled 2020-10-06: qty 1

## 2020-10-06 MED ORDER — OXYCODONE-ACETAMINOPHEN 5-325 MG PO TABS: 1.0000 | ORAL_TABLET | Freq: Three times a day (TID) | ORAL | 0 refills | Status: DC | PRN

## 2020-10-06 MED ORDER — METHOCARBAMOL 500 MG PO TABS
750.0000 mg | ORAL_TABLET | Freq: Once | ORAL | Status: AC
  Administered 2020-10-06: 750 mg via ORAL
  Filled 2020-10-06: qty 2

## 2020-10-06 MED ORDER — ONDANSETRON 4 MG PO TBDP: 4.0000 mg | ORAL_TABLET | Freq: Three times a day (TID) | ORAL | 0 refills | Status: DC | PRN

## 2020-10-06 MED ORDER — ONDANSETRON HCL 4 MG/2ML IJ SOLN
4.0000 mg | Freq: Once | INTRAMUSCULAR | Status: AC
  Administered 2020-10-06: 4 mg via INTRAVENOUS
  Filled 2020-10-06: qty 2

## 2020-10-06 MED ORDER — LIDOCAINE 5 % EX PTCH: 1.0000 | MEDICATED_PATCH | CUTANEOUS | 0 refills | Status: DC

## 2020-10-06 NOTE — Discharge Instructions (Addendum)
Prescription given for Norco. Take medication as directed and do not operate machinery, drive a car, or work while taking this medication as it can make you drowsy.    Take it easy, but do not lay around too much as this may make any stiffness worse.  Acetaminophen: May take acetaminophen (generic for Tylenol), as needed, for pain. Your daily total maximum amount of acetaminophen from all sources should be limited to 4000mg /day for persons without liver problems, or 2000mg /day for those with liver problems. Methocarbamol: Methocarbamol (generic for Robaxin) is a muscle relaxer and can help relieve stiff muscles or muscle spasms.  Do not drive or perform other dangerous activities while taking this medication as it can cause drowsiness as well as changes in reaction time and judgement. Nausea/vomiting: Use the ondansetron (generic for Zofran) for nausea or vomiting.  This medication may not prevent all vomiting or nausea, but can help facilitate better hydration. Things that can help with nausea/vomiting also include peppermint/menthol candies, vitamin B12, and ginger. Lidocaine patches: These are available via either prescription or over-the-counter. The over-the-counter option may be more economical one and are likely just as effective. There are multiple over-the-counter brands, such as Salonpas. Ice: May apply ice to the area over the next 24 hours for 15 minutes at a time to reduce pain, inflammation, and swelling, if present. Exercises: Be sure to perform the attached exercises starting with three times a week and working up to performing them daily. This is an essential part of preventing long term problems.   For prescription assistance, may try using prescription discount sites or apps, such as goodrx.com  Please follow up with your primary care provider within 5-7 days for re-evaluation of your symptoms. If you do not have a primary care provider, information for a healthcare clinic has been  provided for you to make arrangements for follow up care.   Return to the emergency department immediately if you experience any back pain associated with fevers, loss of control of your bowels/bladder, weakness/numbness to your legs, numbness to your groin area, inability to walk, or inability to urinate.

## 2020-10-06 NOTE — ED Triage Notes (Signed)
Pt coming from home. Pt with persistent back back pain since Monday. Pt states she was seen by her dr and instructed to take tylenol but has no relief from that. VSS. NAD.

## 2020-10-06 NOTE — ED Notes (Signed)
Patient transported to X-ray 

## 2020-10-06 NOTE — ED Provider Notes (Signed)
[facility] EMERGENCY DEPARTMENT Provider Note   CSN: [id number] Arrival date & time: 10/06/20  1126     History Chief Complaint  Patient presents with  . Back Pain    Nancy [name] is a 64 y.o. female.  HPI   64 year old female with a history of hep C, diabetes, GERD, H. pylori, hyperlipidemia, who presents to the emergency department today for evaluation of lower back pain. Patient states she has had pain to the right lower back for the last week. She notes that it was her birthday last week and she did a lot of dancing. Her pain started after this. She also admits that she does some heavy lifting frequently at home when she carries groceries, etc. She denies any falls or direct trauma. Pain radiates down the right lower extremity. Pain worsens when she tries to go from sitting to standing. It is also worse when she tries to turn on her side. It improves in certain positions and at rest.   She reports some paresthesias to the right lower extremity as well. Denies any loss control of bowel or bladder function. Denies any fevers, history of cancer. She reports some suprapubic pressure and some pain along the right inguinal area. No reported nausea vomiting diarrhea, urinary or other GI symptoms.  Past Medical History:  Diagnosis Date  . Carpal tunnel syndrome, bilateral    confirmed on nerve conduction studies  . Chronic hepatitis C ([place])    considered cured 06/2016 post retroviral therapy   . Diabetes mellitus type 2, uncontrolled ([place])   . Diabetic peripheral neuropathy ([place])   . GERD (gastroesophageal reflux disease)   . History of endometriosis   . History of Helicobacter infection 11/07  . Hyperlipidemia   . Liver fibrosis 02/17/2016   F2/3     Patient Active Problem List   Diagnosis Date Noted  . Forgetfulness 07/08/2020  . Groin pain (L>R) 07/27/2019  . Hyperlipidemia associated with type 2 diabetes mellitus ([place]) 04/20/2019  . Vaginal atrophy  07/22/2018  . Morbid obesity ([place]) 08/04/2017  . CKD stage 3 due to type 2 diabetes mellitus ([place]) 08/02/2017  . Constipation 09/08/2016  . Muscle spasm 04/23/2016  . Health care maintenance 10/03/2014  . GERD (gastroesophageal reflux disease) 01/16/2014  . Carpal tunnel syndrome, bilateral   . Hypertension associated with diabetes ([place]) 11/09/2006  . Controlled diabetes mellitus with retinopathy ([place]) 12/07/2002    Past Surgical History:  Procedure Laterality Date  . ABDOMINAL HYSTERECTOMY    . CARPAL TUNNEL RELEASE       OB History    Gravida  0   Para  0   Term  0   Preterm  0   AB  0   Living  0     SAB  0   TAB  0   Ectopic  0   Multiple  0   Live Births  0           Family History  Problem Relation Age of Onset  . Cancer Mother        lung  . Kidney disease Mother   . Lung cancer Maternal Uncle   . Breast cancer Maternal Grandmother   . Liver cancer Maternal Grandfather   . Heart disease Brother   . Depression Brother   . Diabetes Father   . Dementia Father   . Colon cancer Neg Hx     Social History   Tobacco Use  . Smoking status: Never  Smoker  . Smokeless tobacco: Never Used  Vaping Use  . Vaping Use: Never used  Substance Use Topics  . Alcohol use: No    Alcohol/week: 0.0 standard drinks  . Drug use: No    Home Medications Prior to Admission medications   Medication Sig Start Date End Date Taking? Authorizing Provider  ACCU-CHEK FASTCLIX LANCETS MISC Use to check blood sugar up to 3 times a day 10/11/18   Eulah Pont, MD  atorvastatin (LIPITOR) 40 MG tablet Take 1 tablet (40 mg total) by mouth daily. 07/08/20   Merrilyn Puma, MD  Blood Glucose Monitoring Suppl (ACCU-CHEK GUIDE) w/Device KIT 1 each by Does not apply route 3 (three) times daily. Check blood sugar 3 times a day 07/08/20   Merrilyn Puma, MD  dapagliflozin propanediol (FARXIGA) 5 MG TABS tablet Take 1 tablet (5 mg total) by mouth daily before breakfast. 10/05/20 01/03/21   Dellia Cloud, MD  gabapentin (NEURONTIN) 100 MG capsule Take 1 capsule (100 mg total) by mouth at bedtime. 07/08/20   Merrilyn Puma, MD  glucose blood (ACCU-CHEK GUIDE) test strip Check blood sugar 3 times a day 07/08/20   Merrilyn Puma, MD  insulin glargine (LANTUS) 100 UNIT/ML injection INJECT 32 UNITS INTO THE SKIN EVERY MORNING WHEN YOU WAKE UP 07/08/20   Merrilyn Puma, MD  Insulin Pen Needle 32G X 4 MM MISC Please use with liraglutide.  you will take 1.2 mg daily. 07/08/20   Merrilyn Puma, MD  Insulin Syringe-Needle U-100 31G X 15/64" 0.5 ML MISC Use to inject insulin one time a day 07/08/20   Merrilyn Puma, MD  Insulin Syringes, Disposable, U-100 1 ML MISC 2 Syringes by Does not apply route daily. Use to inject insulin into the skin 1 time daily. diag code E11.9. Insulin dependent 07/08/20   Merrilyn Puma, MD  mometasone (NASONEX) 50 MCG/ACT nasal spray Place 2 sprays into the nose daily. 07/08/20   Merrilyn Puma, MD  olmesartan (BENICAR) 40 MG tablet Take 1 tablet (40 mg total) by mouth daily. 07/08/20   Merrilyn Puma, MD  omeprazole (PRILOSEC) 40 MG capsule Take 1 capsule (40 mg total) by mouth daily. 07/08/20   Merrilyn Puma, MD  oxyCODONE-acetaminophen (PERCOCET/ROXICET) 5-325 MG tablet Take 1 tablet by mouth every 8 (eight) hours as needed for severe pain. 10/06/20   Iva Montelongo S, PA-C  senna (SENOKOT) 8.6 MG tablet Take 1 tablet (8.6 mg total) by mouth as needed for constipation. 07/08/20   Merrilyn Puma, MD  Wheat Dextrin (BENEFIBER PO) Take 1 tablet by mouth daily as needed (constipation).    [provider]    Allergies    Ace inhibitors, Amitriptyline hcl, Aspirin, Latex, and Prednisone  Review of Systems   Review of Systems  Constitutional: Negative for fever.  Respiratory: Negative for shortness of breath.   Cardiovascular: Negative for chest pain.  Gastrointestinal: Negative for abdominal pain, blood in stool, constipation, diarrhea, nausea and vomiting.    Genitourinary: Negative for dysuria, flank pain, frequency, hematuria and urgency.  Musculoskeletal: Positive for back pain. Negative for gait problem.  Skin: Negative for wound.  Neurological: Negative for weakness and numbness.    Physical Exam Updated Vital Signs BP (!) 179/89 (BP Location: Right Arm)   Pulse 68   Temp 98.9 F (37.2 C) (Oral)   Resp 16   LMP 02/15/2009   SpO2 99%   Physical Exam Vitals and nursing note reviewed.  Constitutional:      General: She is not in acute distress.  Appearance: She is well-developed.  HENT:     Head: Normocephalic and atraumatic.  Eyes:     Conjunctiva/sclera: Conjunctivae normal.  Cardiovascular:     Rate and Rhythm: Normal rate and regular rhythm.     Pulses:          Dorsalis pedis pulses are 2+ on the right side and 2+ on the left side.  Pulmonary:     Effort: Pulmonary effort is normal.     Breath sounds: Normal breath sounds.  Abdominal:     Palpations: Abdomen is soft.     Tenderness: There is no guarding or rebound.     Comments: Mild suprapubic discomfort  Musculoskeletal:     Cervical back: Neck supple.     Comments: No midline TTP to the lumbar spine. TTP to the right lumbar paraspinous muscles and along the sciatic notch which reproduces pain.   Skin:    General: Skin is warm and dry.  Neurological:     Mental Status: She is alert.     Comments: Mildly decreased strength to the RLE 2/2 pain, but has normal sensation and is ambulatory with cane.      ED Results / Procedures / Treatments   Labs (all labs ordered are listed, but only abnormal results are displayed) Labs Reviewed  URINALYSIS, ROUTINE W REFLEX MICROSCOPIC - Abnormal; Notable for the following components:      Result Value   Glucose, UA 50 (*)    Protein, ur >=300 (*)    All other components within normal limits  I-STAT CHEM 8, ED - Abnormal; Notable for the following components:   Potassium 3.4 (*)    BUN 25 (*)    Creatinine, Ser 1.80  (*)    Glucose, Bld 107 (*)    Hemoglobin 11.2 (*)    HCT 33.0 (*)    All other components within normal limits    EKG None  Radiology DG Lumbar Spine Complete  Result Date: 10/06/2020 CLINICAL DATA:  LOWER back pain.  Symptoms since Monday. EXAM: LUMBAR SPINE - COMPLETE 4+ VIEW COMPARISON:  09/08/2017 FINDINGS: There is mild facet hypertrophy in the LOWER lumbar levels. Alignment is normal. There is disc height loss and uncovertebral spurring at L3-4. No acute fracture or traumatic subluxation. There is minimal atherosclerotic calcification of the abdominal aorta. IMPRESSION: 1. Mild degenerative changes. 2. No evidence for acute abnormality. Electronically Signed   By: Nolon Nations M.D.   On: 10/06/2020 12:58   DG Hip Unilat W or Wo Pelvis 2-3 Views Right  Result Date: 10/06/2020 CLINICAL DATA:  LOWER back pain.  No known injury. EXAM: DG HIP (WITH OR WITHOUT PELVIS) 2-3V RIGHT COMPARISON:  07/27/2019 FINDINGS: There are mild degenerative changes in both hips. No acute fracture or subluxation. There is atherosclerotic calcification of the RIGHT femoral artery. IMPRESSION: Negative. Electronically Signed   By: Nolon Nations M.D.   On: 10/06/2020 12:59    Procedures Procedures (including critical care time)  Medications Ordered in ED Medications  oxyCODONE-acetaminophen (PERCOCET/ROXICET) 5-325 MG per tablet 1 tablet (1 tablet Oral Given 10/06/20 1256)  fentaNYL (SUBLIMAZE) injection 50 mcg (50 mcg Intramuscular Given 10/06/20 1341)  HYDROmorphone (DILAUDID) injection 1 mg (1 mg Intravenous Given 10/06/20 1420)    ED Course  I have reviewed the triage vital signs and the nursing notes.  Pertinent labs & imaging results that were available during my care of the patient were reviewed by me and considered in my medical decision making (see chart  for details).    MDM Rules/Calculators/A&P                          64 year old female presenting for evaluation of right lower  back pain that started about a week ago.  Reports recent heavy lifting and dancing for her birthday.  Also has some suprapubic pain.  Denies any other associated GI/GU symptoms  Chem-8 with impaired renal function, mildly elevated from baseline mildly elevated BUN.   mild anemia also consistent with baseline. UA not consistent with UTI  X-ray lumbar Spine -  1. Mild degenerative changes. 2. No evidence for acute abnormality. x-ray pelvis - Negative. ua neg   At shift change, care transitioned to [facility] to f/u on ct renal. If negative, pt likely appropriate for d/c if pain improving.   Final Clinical Impression(s) / ED Diagnoses Final diagnoses:  Acute right-sided low back pain with right-sided sciatica    Rx / DC Orders ED Discharge Orders         Ordered    oxyCODONE-acetaminophen (PERCOCET/ROXICET) 5-325 MG tablet  Every 8 hours PRN        10/06/20 [address] Tanuj Mullens S, PA-C 10/06/20 1513    Dorie Rank, MD 10/07/20 [phone]

## 2020-10-06 NOTE — ED Provider Notes (Signed)
Nancy [name] is a 64 y.o. female, presenting to the ED with back pain for the past week. She continues to deny any urinary symptoms.   HPI from [employer], PA-C: " 64 year old female with a history of hep C, diabetes, GERD, H. pylori, hyperlipidemia, who presents to the emergency department today for evaluation of lower back pain. Patient states she has had pain to the right lower back for the last week. She notes that it was her birthday last week and she did a lot of dancing. Her pain started after this. She also admits that she does some heavy lifting frequently at home when she carries groceries, etc. She denies any falls or direct trauma. Pain radiates down the right lower extremity. Pain worsens when she tries to go from sitting to standing. It is also worse when she tries to turn on her side. It improves in certain positions and at rest.   She reports some paresthesias to the right lower extremity as well. Denies any loss control of bowel or bladder function. Denies any fevers, history of cancer. She reports some suprapubic pressure and some pain along the right inguinal area. No reported nausea vomiting diarrhea, urinary or other GI symptoms."  Past Medical History:  Diagnosis Date   Carpal tunnel syndrome, bilateral    confirmed on nerve conduction studies   Chronic hepatitis C ([place])    considered cured 06/2016 post retroviral therapy    Diabetes mellitus type 2, uncontrolled ([place])    Diabetic peripheral neuropathy ([facility])    GERD (gastroesophageal reflux disease)    History of endometriosis    History of Helicobacter infection 11/07   Hyperlipidemia    Liver fibrosis 02/17/2016   F2/3     Physical Exam  BP (!) 179/89 (BP Location: Right Arm)    Pulse 68    Temp 98.9 F (37.2 C) (Oral)    Resp 16    LMP 02/15/2009    SpO2 99%   Physical Exam Vitals and nursing note reviewed.  Constitutional:      General: She is not in acute distress.    Appearance: She is  well-developed. She is not diaphoretic.  HENT:     Head: Normocephalic and atraumatic.     Mouth/Throat:     Mouth: Mucous membranes are moist.     Pharynx: Oropharynx is clear.  Eyes:     Conjunctiva/sclera: Conjunctivae normal.  Cardiovascular:     Rate and Rhythm: Normal rate and regular rhythm.     Pulses: Normal pulses.          Radial pulses are 2+ on the right side and 2+ on the left side.       Posterior tibial pulses are 2+ on the right side and 2+ on the left side.     Heart sounds: Normal heart sounds.     Comments: Tactile temperature in the extremities appropriate and equal bilaterally. Pulmonary:     Effort: Pulmonary effort is normal. No respiratory distress.     Breath sounds: Normal breath sounds.  Abdominal:     Palpations: Abdomen is soft.     Tenderness: There is no abdominal tenderness. There is no guarding.  Musculoskeletal:     Cervical back: Neck supple.       Back:     Right lower leg: No edema.     Left lower leg: No edema.  Lymphadenopathy:     Cervical: No cervical adenopathy.  Skin:    General: Skin is warm  and dry.  Neurological:     Mental Status: She is alert.     Comments: Sensation grossly intact to light touch in the lower extremities bilaterally. No saddle anesthesias. Strength 5/5 in the bilateral lower extremities. Ambulatory without assistance beyond her baseline. Coordination intact.  Psychiatric:        Mood and Affect: Mood and affect normal.        Speech: Speech normal.        Behavior: Behavior normal.     ED Course/Procedures     Procedures   Abnormal Labs Reviewed  URINALYSIS, ROUTINE W REFLEX MICROSCOPIC - Abnormal; Notable for the following components:      Result Value   Glucose, UA 50 (*)    Protein, ur >=300 (*)    All other components within normal limits  I-STAT CHEM 8, ED - Abnormal; Notable for the following components:   Potassium 3.4 (*)    BUN 25 (*)    Creatinine, Ser 1.80 (*)    Glucose, Bld 107  (*)    Hemoglobin 11.2 (*)    HCT 33.0 (*)    All other components within normal limits    DG Lumbar Spine Complete  Result Date: 10/06/2020 CLINICAL DATA:  LOWER back pain.  Symptoms since Monday. EXAM: LUMBAR SPINE - COMPLETE 4+ VIEW COMPARISON:  09/08/2017 FINDINGS: There is mild facet hypertrophy in the LOWER lumbar levels. Alignment is normal. There is disc height loss and uncovertebral spurring at L3-4. No acute fracture or traumatic subluxation. There is minimal atherosclerotic calcification of the abdominal aorta. IMPRESSION: 1. Mild degenerative changes. 2. No evidence for acute abnormality. Electronically Signed   By: Nolon Nations M.D.   On: 10/06/2020 12:58   CT Renal Stone Study  Result Date: 10/06/2020 CLINICAL DATA:  Left flank pain EXAM: CT ABDOMEN AND PELVIS WITHOUT CONTRAST TECHNIQUE: Multidetector CT imaging of the abdomen and pelvis was performed following the standard protocol without IV contrast. COMPARISON:  None. FINDINGS: Lower chest: Lung bases are clear. No effusions. Heart is normal size. Hepatobiliary: No focal hepatic abnormality. Gallbladder unremarkable. Pancreas: No focal abnormality or ductal dilatation. Spleen: No focal abnormality.  Normal size. Adrenals/Urinary Tract: Punctate nonobstructing stone in the lower pole of the left kidney. No hydronephrosis or ureteral stones. Adrenal glands and urinary bladder unremarkable. Stomach/Bowel: Normal appendix. Stomach, large and small bowel grossly unremarkable. Vascular/Lymphatic: No evidence of aneurysm or adenopathy. Scattered aortic atherosclerosis. Reproductive: Prior hysterectomy.  No adnexal masses. Other: No free fluid or free air. Musculoskeletal: No acute bony abnormality. IMPRESSION: No ureteral stones or hydronephrosis. Punctate left lower pole nephrolithiasis. Aortic atherosclerosis. Electronically Signed   By: Rolm Baptise M.D.   On: 10/06/2020 15:13   DG Hip Unilat W or Wo Pelvis 2-3 Views Right  Result  Date: 10/06/2020 CLINICAL DATA:  LOWER back pain.  No known injury. EXAM: DG HIP (WITH OR WITHOUT PELVIS) 2-3V RIGHT COMPARISON:  07/27/2019 FINDINGS: There are mild degenerative changes in both hips. No acute fracture or subluxation. There is atherosclerotic calcification of the RIGHT femoral artery. IMPRESSION: Negative. Electronically Signed   By: Nolon Nations M.D.   On: 10/06/2020 12:59    MDM   Patient care handoff report received from [facility], PA-C. Plan: Review imaging.  Pain control for the patient.  I personally reviewed and interpreted the patient's labs and imaging studies. Her pain improved over the ED course.  Ambulatory without assistance.  No focal deficits noted.  No evidence for neurosurgical emergency, such  as cauda equina. No acute pertinent abnormalities noted on imaging studies.  Mild hypokalemia noted.  Patient will have this further addressed by PCP.     Lorayne Bender, PA-C 10/06/20 2130    Drenda Freeze, MD 10/06/20 2157

## 2020-10-06 NOTE — ED Notes (Signed)
Pt verbalized understanding of discharge paperwork, prescriptions and follow-up care. Pt ambulated with cane to waiting room. Discharged home with mother.

## 2020-10-07 NOTE — Progress Notes (Signed)
Internal Medicine Clinic Attending  Case discussed with Dr. Coe  At the time of the visit.  We reviewed the resident's history and exam and pertinent patient test results.  I agree with the assessment, diagnosis, and plan of care documented in the resident's note.  

## 2020-10-08 ENCOUNTER — Encounter: Payer: Medicare Other | Admitting: Student

## 2020-11-06 ENCOUNTER — Telehealth: Payer: Self-pay

## 2020-11-06 NOTE — Telephone Encounter (Signed)
Received  TC from patient who states she was evaluated in ED 10/06/20 for back pain and was given medication for pain/muscle relaxers which have worked well.  Asking for refills.  Advised patient to schedule f/u appt and discuss with MD, she verbalized understanding.  Appointment made for tomorrow, red team/ Dr. Gilford Rile at 1045. SChaplin, RN,BSN

## 2020-11-07 ENCOUNTER — Encounter: Payer: Self-pay | Admitting: Internal Medicine

## 2020-11-07 ENCOUNTER — Ambulatory Visit (INDEPENDENT_AMBULATORY_CARE_PROVIDER_SITE_OTHER): Payer: Medicare Other | Admitting: Internal Medicine

## 2020-11-07 ENCOUNTER — Other Ambulatory Visit: Payer: Self-pay

## 2020-11-07 VITALS — BP 168/97 | HR 64 | Temp 98.2°F | Ht <= 58 in | Wt 169.2 lb

## 2020-11-07 DIAGNOSIS — Z794 Long term (current) use of insulin: Secondary | ICD-10-CM

## 2020-11-07 DIAGNOSIS — M545 Low back pain, unspecified: Secondary | ICD-10-CM

## 2020-11-07 DIAGNOSIS — E1122 Type 2 diabetes mellitus with diabetic chronic kidney disease: Secondary | ICD-10-CM

## 2020-11-07 DIAGNOSIS — E11319 Type 2 diabetes mellitus with unspecified diabetic retinopathy without macular edema: Secondary | ICD-10-CM | POA: Diagnosis not present

## 2020-11-07 DIAGNOSIS — I152 Hypertension secondary to endocrine disorders: Secondary | ICD-10-CM

## 2020-11-07 DIAGNOSIS — N183 Chronic kidney disease, stage 3 unspecified: Secondary | ICD-10-CM

## 2020-11-07 DIAGNOSIS — E1159 Type 2 diabetes mellitus with other circulatory complications: Secondary | ICD-10-CM

## 2020-11-07 DIAGNOSIS — M549 Dorsalgia, unspecified: Secondary | ICD-10-CM | POA: Insufficient documentation

## 2020-11-07 MED ORDER — CYCLOBENZAPRINE HCL 5 MG PO TABS: 5.0000 mg | ORAL_TABLET | Freq: Three times a day (TID) | ORAL | 0 refills | Status: DC | PRN

## 2020-11-07 MED ORDER — ACETAMINOPHEN 325 MG PO TABS: 650.0000 mg | ORAL_TABLET | Freq: Four times a day (QID) | ORAL | 2 refills | Status: DC | PRN

## 2020-11-07 MED ORDER — AMLODIPINE BESYLATE 5 MG PO TABS: 5.0000 mg | ORAL_TABLET | Freq: Every day | ORAL | 11 refills | Status: DC

## 2020-11-07 NOTE — Assessment & Plan Note (Addendum)
Patient presents to the clinic with complaints of back pain. Per the patient, their back pain started two  months ago, while the patient was Dancing at a party. Their pain is located mid lower back does not radiate. Patient describes the pain as soreness. Alleviating factors include percocet from the ED on 10/06/20, and physical activity aggravates his pain. Patient denies any weakness, urinary/fecal incontinence, saddle numbness, or other red flags concerning for spinal cord compression.   A:  Patient likely has musculoskeletal strain given the timeline and narrative. They have tried heating pads with little relief. They does not have alarm symptoms. At this time we discussed conservative measures, and that percocet is not indicated for her form of back pain. We did discuss having patient follow with PT for exercise, but patient declined. Will treat conservatively with tylenol, heating pad, and at excercise Patient voiced understanding.   P:  - acetaminophen 650 mg every 6 hours as needed - Continue heating pad - Flexeril 5 mg three times daily as needed - Patient given back exercise packet - Patient deferred PT at this time. May revisit at next appointment.  - Given strict return precautions.

## 2020-11-07 NOTE — Assessment & Plan Note (Signed)
Patient presents to the clinic for a follow up on there hypertension. Patient does not take their blood pressure readings at home. Their previous office visit vitals show:  Vitals with BMI 11/07/2020 11/07/2020 10/06/2020  Height - 4\' 10"  -  Weight - 169 lbs 3 oz -  BMI - 34.19 -  Systolic [phone]  Diastolic 97 353 77  Pulse 64 66 60     Patient states they do take their blood pressure medications. They currently take omlesartan 40 mg daily. They experience no medication side effects. Blood pressure is High at today's visit.  Plan:  - Start Amlodipine.  -Continue omlesartan 40 mg daily. - BMP today - Assess vitals at next visit.

## 2020-11-07 NOTE — Progress Notes (Signed)
Internal Medicine Clinic Attending  Case discussed with Dr. Winters at the time of the visit.  We reviewed the resident's history and exam and pertinent patient test results.  I agree with the assessment, diagnosis, and plan of care documented in the resident's note.  Elis Rawlinson, M.D., Ph.D.  

## 2020-11-07 NOTE — Patient Instructions (Addendum)
To Nancy [name],   It was a pleasure meeting you today, Today we discussed your back pain and blood pressure. For your back pain, we will give you a muscle relaxant, flexeril, please take this as needed. Also treat your pain with tylenol, and continue to stretch your back. For you blood pressure we will start you on a new medication (Norvasc), please take this daily. We will follow up with you in 2-3 weeks time to check your pressures.  Sincerely,  Maudie Mercury, MD Back Exercises These exercises help to make your trunk and back strong. They also help to keep the lower back flexible. Doing these exercises can help to prevent back pain or lessen existing pain.  If you have back pain, try to do these exercises 2-3 times each day or as told by your doctor.  As you get better, do the exercises once each day. Repeat the exercises more often as told by your doctor.  To stop back pain from coming back, do the exercises once each day, or as told by your doctor. Exercises Single knee to chest Do these steps 3-5 times in a row for each leg: 1. Lie on your back on a firm bed or the floor with your legs stretched out. 2. Bring one knee to your chest. 3. Grab your knee or thigh with both hands and hold them it in place. 4. Pull on your knee until you feel a gentle stretch in your lower back or buttocks. 5. Keep doing the stretch for 10-30 seconds. 6. Slowly let go of your leg and straighten it. Pelvic tilt Do these steps 5-10 times in a row: 1. Lie on your back on a firm bed or the floor with your legs stretched out. 2. Bend your knees so they point up to the ceiling. Your feet should be flat on the floor. 3. Tighten your lower belly (abdomen) muscles to press your lower back against the floor. This will make your tailbone point up to the ceiling instead of pointing down to your feet or the floor. 4. Stay in this position for 5-10 seconds while you gently tighten your muscles and breathe  evenly. Cat-cow Do these steps until your lower back bends more easily: 1. Get on your hands and knees on a firm surface. Keep your hands under your shoulders, and keep your knees under your hips. You may put padding under your knees. 2. Let your head hang down toward your chest. Tighten (contract) the muscles in your belly. Point your tailbone toward the floor so your lower back becomes rounded like the back of a cat. 3. Stay in this position for 5 seconds. 4. Slowly lift your head. Let the muscles of your belly relax. Point your tailbone up toward the ceiling so your back forms a sagging arch like the back of a cow. 5. Stay in this position for 5 seconds.  Press-ups Do these steps 5-10 times in a row: 1. Lie on your belly (face-down) on the floor. 2. Place your hands near your head, about shoulder-width apart. 3. While you keep your back relaxed and keep your hips on the floor, slowly straighten your arms to raise the top half of your body and lift your shoulders. Do not use your back muscles. You may change where you place your hands in order to make yourself more comfortable. 4. Stay in this position for 5 seconds. 5. Slowly return to lying flat on the floor.  Bridges Do these steps 10 times  in a row: 1. Lie on your back on a firm surface. 2. Bend your knees so they point up to the ceiling. Your feet should be flat on the floor. Your arms should be flat at your sides, next to your body. 3. Tighten your butt muscles and lift your butt off the floor until your waist is almost as high as your knees. If you do not feel the muscles working in your butt and the back of your thighs, slide your feet 1-2 inches farther away from your butt. 4. Stay in this position for 3-5 seconds. 5. Slowly lower your butt to the floor, and let your butt muscles relax. If this exercise is too easy, try doing it with your arms crossed over your chest. Belly crunches Do these steps 5-10 times in a row: 1. Lie on  your back on a firm bed or the floor with your legs stretched out. 2. Bend your knees so they point up to the ceiling. Your feet should be flat on the floor. 3. Cross your arms over your chest. 4. Tip your chin a little bit toward your chest but do not bend your neck. 5. Tighten your belly muscles and slowly raise your chest just enough to lift your shoulder blades a tiny bit off of the floor. Avoid raising your body higher than that, because it can put too much stress on your low back. 6. Slowly lower your chest and your head to the floor. Back lifts Do these steps 5-10 times in a row: 1. Lie on your belly (face-down) with your arms at your sides, and rest your forehead on the floor. 2. Tighten the muscles in your legs and your butt. 3. Slowly lift your chest off of the floor while you keep your hips on the floor. Keep the back of your head in line with the curve in your back. Look at the floor while you do this. 4. Stay in this position for 3-5 seconds. 5. Slowly lower your chest and your face to the floor. Contact a doctor if:  Your back pain gets a lot worse when you do an exercise.  Your back pain does not get better 2 hours after you exercise. If you have any of these problems, stop doing the exercises. Do not do them again unless your doctor says it is okay. Get help right away if:  You have sudden, very bad back pain. If this happens, stop doing the exercises. Do not do them again unless your doctor says it is okay. This information is not intended to replace advice given to you by your health care provider. Make sure you discuss any questions you have with your health care provider. Document Revised: 08/18/2018 Document Reviewed: 08/18/2018 Elsevier Patient Education  2020 [employer].

## 2020-11-07 NOTE — Progress Notes (Signed)
   CC: Back Pain  HPI:  [name] is a 64 y.o. female, with a PMH below, who presents to the clinic for back pain. To see the management of her acute and chronic conditions, please see the A&P under the encounter tab.   Past Medical History:  Diagnosis Date  . Carpal tunnel syndrome, bilateral    confirmed on nerve conduction studies  . Chronic hepatitis C ([place])    considered cured 06/2016 post retroviral therapy   . Diabetes mellitus type 2, uncontrolled ([place])   . Diabetic peripheral neuropathy ([place])   . GERD (gastroesophageal reflux disease)   . History of endometriosis   . History of Helicobacter infection 11/07  . Hyperlipidemia   . Liver fibrosis 02/17/2016   F2/3    Review of Systems:   Review of Systems  Constitutional: Negative for chills, fever, malaise/fatigue and weight loss.  Gastrointestinal: Negative for abdominal pain, constipation, diarrhea, nausea and vomiting.  Musculoskeletal: Positive for back pain. Negative for falls, joint pain and neck pain.    Physical Exam:  Vitals:   11/07/20 1045 11/07/20 1056  BP: (!) 174/101 (!) 168/97  Pulse: 66 64  Temp: 98.2 F (36.8 C)   TempSrc: Oral   SpO2: 100%   Weight: 169 lb 3.2 oz (76.7 kg)   Height: 4\' 10"  (1.473 m)    Physical Exam Constitutional:      General: She is not in acute distress.    Appearance: She is obese. She is not ill-appearing or toxic-appearing.  Cardiovascular:     Rate and Rhythm: Normal rate and regular rhythm.     Pulses: Normal pulses.     Heart sounds: Normal heart sounds. No murmur heard.  No friction rub. No gallop.   Pulmonary:     Effort: Pulmonary effort is normal.     Breath sounds: Normal breath sounds. No wheezing, rhonchi or rales.  Musculoskeletal:        General: Tenderness present. No swelling. Normal range of motion.     Right lower leg: No edema.     Left lower leg: No edema.     Comments: Tenderness to palpitation along the paraspinal miuscles of the  lumbar spine. Full ROM.   Skin:    General: Skin is warm and dry.     Findings: No bruising, erythema, lesion or rash.  Neurological:     Mental Status: She is alert and oriented to person, place, and time.  Psychiatric:        Mood and Affect: Mood normal.        Behavior: Behavior normal.     Assessment & Plan:   See Encounters Tab for problem based charting.  Patient discussed with Dr. Rebeca Alert

## 2020-11-08 LAB — BMP8+ANION GAP
Anion Gap: 15 mmol/L (ref 10.0–18.0)
BUN/Creatinine Ratio: 9 — ABNORMAL LOW (ref 12–28)
BUN: 17 mg/dL (ref 8–27)
CO2: 21 mmol/L (ref 20–29)
Calcium: 9.1 mg/dL (ref 8.7–10.3)
Chloride: 104 mmol/L (ref 96–106)
Creatinine, Ser: 1.84 mg/dL — ABNORMAL HIGH (ref 0.57–1.00)
GFR calc Af Amer: 33 mL/min/{1.73_m2} — ABNORMAL LOW (ref >59)
GFR calc non Af Amer: 29 mL/min/{1.73_m2} — ABNORMAL LOW (ref >59)
Glucose: 222 mg/dL — ABNORMAL HIGH (ref 65–99)
Potassium: 3.9 mmol/L (ref 3.5–5.2)
Sodium: 140 mmol/L (ref 134–144)

## 2020-11-11 ENCOUNTER — Other Ambulatory Visit: Payer: Self-pay

## 2020-11-11 DIAGNOSIS — E1122 Type 2 diabetes mellitus with diabetic chronic kidney disease: Secondary | ICD-10-CM

## 2020-11-11 DIAGNOSIS — N183 Chronic kidney disease, stage 3 unspecified: Secondary | ICD-10-CM

## 2020-11-11 DIAGNOSIS — E11319 Type 2 diabetes mellitus with unspecified diabetic retinopathy without macular edema: Secondary | ICD-10-CM

## 2020-11-11 MED ORDER — DAPAGLIFLOZIN PROPANEDIOL 5 MG PO TABS: 5.0000 mg | ORAL_TABLET | Freq: Every day | ORAL | 2 refills | Status: AC

## 2020-11-11 NOTE — Telephone Encounter (Signed)
Need refill on dapagliflozin propanediol (FARXIGA) 5 MG TABS tablet ;pt contact [facility] [place]

## 2020-11-12 ENCOUNTER — Telehealth: Payer: Self-pay

## 2020-11-12 NOTE — Telephone Encounter (Signed)
Needs a PA for FARXIGA, ASAP, pt is out

## 2020-11-12 NOTE — Telephone Encounter (Signed)
Requesting to speak with a nurse about meds, please call pt back.  

## 2020-11-14 NOTE — Telephone Encounter (Signed)
Call to Pharmacy patient had gotten [facility] #[id number] this past  November.  Patient was called and informed of.  Stated she only received 30.  Patient to call Pharmacy.

## 2020-11-25 ENCOUNTER — Encounter: Payer: Self-pay | Admitting: Internal Medicine

## 2020-11-25 ENCOUNTER — Ambulatory Visit (INDEPENDENT_AMBULATORY_CARE_PROVIDER_SITE_OTHER): Payer: Medicare Other | Admitting: Internal Medicine

## 2020-11-25 ENCOUNTER — Other Ambulatory Visit: Payer: Self-pay

## 2020-11-25 VITALS — BP 151/85 | HR 98 | Temp 98.0°F | Ht <= 58 in | Wt 168.1 lb

## 2020-11-25 DIAGNOSIS — E1159 Type 2 diabetes mellitus with other circulatory complications: Secondary | ICD-10-CM | POA: Diagnosis not present

## 2020-11-25 DIAGNOSIS — M545 Low back pain, unspecified: Secondary | ICD-10-CM

## 2020-11-25 DIAGNOSIS — I152 Hypertension secondary to endocrine disorders: Secondary | ICD-10-CM

## 2020-11-25 MED ORDER — DICLOFENAC SODIUM 1 % EX GEL: 2.0000 g | Freq: Four times a day (QID) | CUTANEOUS | 2 refills | Status: DC

## 2020-11-25 MED ORDER — AMLODIPINE BESYLATE 10 MG PO TABS: 10.0000 mg | ORAL_TABLET | Freq: Every day | ORAL | 11 refills | Status: DC

## 2020-11-25 NOTE — Progress Notes (Signed)
   CC: Blood Pressure  HPI:  [name] is a 64 y.o. M/F, with a PMH noted below, who presents to the clinic for follow up on her blood pressure and back pain. To see the management of their acute and chronic conditions, please see the A&P note under the Encounters tab.   Past Medical History:  Diagnosis Date  . Carpal tunnel syndrome, bilateral    confirmed on nerve conduction studies  . Chronic hepatitis C ([place])    considered cured 06/2016 post retroviral therapy   . Diabetes mellitus type 2, uncontrolled ([place])   . Diabetic peripheral neuropathy ([place])   . GERD (gastroesophageal reflux disease)   . History of endometriosis   . History of Helicobacter infection 11/07  . Hyperlipidemia   . Liver fibrosis 02/17/2016   F2/3    Review of Systems:   Review of Systems  Constitutional: Negative for chills, malaise/fatigue and weight loss.  Respiratory: Negative for cough, hemoptysis, sputum production and shortness of breath.   Cardiovascular: Negative for chest pain, palpitations, orthopnea, claudication and leg swelling.  Gastrointestinal: Negative for abdominal pain, constipation, diarrhea, nausea and vomiting.  Musculoskeletal: Positive for back pain. Negative for falls, joint pain, myalgias and neck pain.     Physical Exam:  Vitals:   11/25/20 1044 11/25/20 1048  BP: (!) 144/89 (!) 151/85  Pulse: 98   Temp: 98 F (36.7 C)   TempSrc: Oral   SpO2: 100%   Weight: 168 lb 1.6 oz (76.2 kg)   Height: 4\' 9"  (1.448 m)    Physical Exam Constitutional:      Appearance: Normal appearance.  HENT:     Head: Normocephalic and atraumatic.  Cardiovascular:     Rate and Rhythm: Normal rate and regular rhythm.     Pulses: Normal pulses.     Heart sounds: Normal heart sounds. No murmur heard. No friction rub. No gallop.   Pulmonary:     Effort: Pulmonary effort is normal.     Breath sounds: Normal breath sounds. No wheezing, rhonchi or rales.  Abdominal:     General: Abdomen  is flat. Bowel sounds are normal.     Palpations: Abdomen is soft.     Tenderness: There is no abdominal tenderness. There is no guarding.  Musculoskeletal:        General: Tenderness present. No swelling.     Comments: Tenderness to the musculature of the paraspinal muscles, full ROM  Neurological:     Mental Status: She is alert.      Assessment & Plan:   See Encounters Tab for problem based charting.  Patient discussed with Dr. Heber Hoskins

## 2020-11-25 NOTE — Assessment & Plan Note (Signed)
Patient presents to the clinic today for follow up on her blood pressure. She is currently taking omlesartan 40 mg daily, and was started on amlodipine 5 mg daily on 11/07/2020. She is compliant with her medications and has not suffered any side effects. Her BP at today's visit is:  Vitals with BMI 11/25/2020 11/25/2020 11/07/2020  Height - 4\' 9"  -  Weight - 168 lbs 2 oz -  BMI - 62.22 -  Systolic 979 892 119  Diastolic 85 89 97  Pulse - 98 64   Which is improved from her last visit, but has room to improve. Will increase her amlodipine today to 10 mg daily and have her follow back in 1 month's time.  - Increase amlodipine to 10 mg daily  - Continue Omlesartan 40 mg daily  - Follow up in 1 month's time.

## 2020-11-25 NOTE — Assessment & Plan Note (Signed)
Patient states at today's visit that she continues to have back pain. She endorsees following the instructions given to her at last visit with little improvement. She states that she is taking the Flexeril once daily with some relief, but feels sedated, foggy, and tired from taking the medication.   A/P On physical examination she continues to have tenderness in the paraspinal musculature. She does not have any red flags for compression syndrome. She is experiencing symptoms from her muscle relaxant, which we will discontinue today. Will put in a referral to PT today, and start voltaren gel, given her CKD stage 3.  - Continue heating pad - Continue acetaminophen 650 mg every 6 hours as needed - DC Flexeril  - Pt Referral  - Start Voltaren gel 1% 2g 4 times daily

## 2020-11-25 NOTE — Patient Instructions (Signed)
To Ms. Springsteen,  It was a pleasure seeing you again! Today we discussed your blood pressure and back pain. For your blood pressure, we have increased your dose of amlodipine from 5 mg to 10 mg daily.   For your back pain, please stop taking the flexeril. We have started you on Voltaren gel that you can apply 4 times daily to the affected area. We have also put in a referral to physical therapy. We will see you in one month's time! Have a happy holiday season!  Sincerely,  Maudie Mercury, MD

## 2020-11-26 NOTE — Progress Notes (Signed)
Internal Medicine Clinic Attending ? ?Case discussed with Dr. Winters  At the time of the visit.  We reviewed the resident?s history and exam and pertinent patient test results.  I agree with the assessment, diagnosis, and plan of care documented in the resident?s note.  ?

## 2020-12-02 DIAGNOSIS — N1832 Chronic kidney disease, stage 3b: Secondary | ICD-10-CM | POA: Diagnosis not present

## 2020-12-13 DIAGNOSIS — R801 Persistent proteinuria, unspecified: Secondary | ICD-10-CM | POA: Diagnosis not present

## 2020-12-13 DIAGNOSIS — K5900 Constipation, unspecified: Secondary | ICD-10-CM | POA: Diagnosis not present

## 2020-12-13 DIAGNOSIS — R768 Other specified abnormal immunological findings in serum: Secondary | ICD-10-CM | POA: Diagnosis not present

## 2020-12-13 DIAGNOSIS — E1122 Type 2 diabetes mellitus with diabetic chronic kidney disease: Secondary | ICD-10-CM | POA: Diagnosis not present

## 2020-12-13 DIAGNOSIS — N184 Chronic kidney disease, stage 4 (severe): Secondary | ICD-10-CM | POA: Diagnosis not present

## 2020-12-13 DIAGNOSIS — I129 Hypertensive chronic kidney disease with stage 1 through stage 4 chronic kidney disease, or unspecified chronic kidney disease: Secondary | ICD-10-CM | POA: Diagnosis not present

## 2020-12-13 DIAGNOSIS — E559 Vitamin D deficiency, unspecified: Secondary | ICD-10-CM | POA: Diagnosis not present

## 2020-12-17 ENCOUNTER — Ambulatory Visit: Payer: Medicare Other | Attending: Internal Medicine

## 2020-12-18 NOTE — Addendum Note (Signed)
Addended by: Hulan Fray on: 12/18/2020 05:31 PM   Modules accepted: Orders

## 2020-12-24 ENCOUNTER — Encounter: Payer: Medicare Other | Admitting: Student

## 2021-01-07 ENCOUNTER — Encounter: Payer: Medicare Other | Admitting: Student

## 2021-01-08 ENCOUNTER — Encounter: Payer: Medicare Other | Admitting: Student

## 2021-02-17 ENCOUNTER — Other Ambulatory Visit: Payer: Self-pay

## 2021-02-17 MED ORDER — CYCLOBENZAPRINE HCL 5 MG PO TABS: 5.0000 mg | ORAL_TABLET | Freq: Three times a day (TID) | ORAL | 0 refills | Status: DC | PRN

## 2021-02-17 NOTE — Addendum Note (Signed)
Addended by: Maudie Mercury C on: 02/17/2021 02:33 PM   Modules accepted: Orders

## 2021-02-17 NOTE — Telephone Encounter (Signed)
Need refill on muscle spasm medicine  ;pt contact [phone]   [id number] - [place], [place]

## 2021-02-17 NOTE — Telephone Encounter (Signed)
Called to inform pt her refill request for flexeril was denied Pt states she uses the gel at times, but it doenst work as well as flexeril.  Pt states that she informed [facility] during her last visit that she was using the muscle spasms pills and that if she doesnt take them she "cant hardly walk".  Pt asking MD to reconsider refill request and/or give her call.  Please advise.Despina Hidden Cassady3/14/202211:35 AM

## 2021-03-04 ENCOUNTER — Other Ambulatory Visit: Payer: Self-pay

## 2021-03-04 ENCOUNTER — Ambulatory Visit (INDEPENDENT_AMBULATORY_CARE_PROVIDER_SITE_OTHER): Payer: Medicare Other | Admitting: Internal Medicine

## 2021-03-04 ENCOUNTER — Encounter: Payer: Self-pay | Admitting: Internal Medicine

## 2021-03-04 VITALS — BP 151/86 | HR 72 | Temp 98.2°F | Ht <= 58 in | Wt 175.1 lb

## 2021-03-04 DIAGNOSIS — S91209A Unspecified open wound of unspecified toe(s) with damage to nail, initial encounter: Secondary | ICD-10-CM | POA: Diagnosis not present

## 2021-03-04 DIAGNOSIS — E11319 Type 2 diabetes mellitus with unspecified diabetic retinopathy without macular edema: Secondary | ICD-10-CM

## 2021-03-04 DIAGNOSIS — E1169 Type 2 diabetes mellitus with other specified complication: Secondary | ICD-10-CM

## 2021-03-04 DIAGNOSIS — E1159 Type 2 diabetes mellitus with other circulatory complications: Secondary | ICD-10-CM

## 2021-03-04 DIAGNOSIS — Z794 Long term (current) use of insulin: Secondary | ICD-10-CM

## 2021-03-04 DIAGNOSIS — N183 Chronic kidney disease, stage 3 unspecified: Secondary | ICD-10-CM

## 2021-03-04 DIAGNOSIS — I152 Hypertension secondary to endocrine disorders: Secondary | ICD-10-CM | POA: Diagnosis not present

## 2021-03-04 DIAGNOSIS — L609 Nail disorder, unspecified: Secondary | ICD-10-CM | POA: Diagnosis not present

## 2021-03-04 DIAGNOSIS — E1122 Type 2 diabetes mellitus with diabetic chronic kidney disease: Secondary | ICD-10-CM

## 2021-03-04 LAB — GLUCOSE, CAPILLARY: Glucose-Capillary: 195 mg/dL — ABNORMAL HIGH (ref 70–99)

## 2021-03-04 LAB — POCT GLYCOSYLATED HEMOGLOBIN (HGB A1C): Hemoglobin A1C: 10.6 % — AB (ref 4.0–5.6)

## 2021-03-04 MED ORDER — DAPAGLIFLOZIN PROPANEDIOL 10 MG PO TABS: 10.0000 mg | ORAL_TABLET | Freq: Every day | ORAL | 1 refills | Status: DC

## 2021-03-04 MED ORDER — DAPAGLIFLOZIN PROPANEDIOL 5 MG PO TABS: 5.0000 mg | ORAL_TABLET | Freq: Every day | ORAL | 0 refills | Status: DC

## 2021-03-04 NOTE — Patient Instructions (Addendum)
Thank you for allowing [place] to provide your care today. Today we discussed your broken toenail and type II diabetes  I have ordered the following labs for you:    I will call if any are abnormal.    Today we made the following changes to your medications:   Please START taking  Dapagliflozin 5 mg - take 1 tablet per day   In four weeks you will increase to:  Dapagliflozin 10 mg 1 tablet per day.   Please STOP taking   Please follow-up in one month for blood pressure check.   Please call with your medication list when you get home.   Please call the internal medicine center clinic if you have any questions or concerns, we may be able to help and keep you from a long and expensive emergency room wait. Our clinic and after hours phone number is [phone], the best time to call is Monday through Friday 9 am to 4 pm but there is always someone available 24/7 if you have an emergency. If you need medication refills please notify your pharmacy one week in advance and they will send [place] a request.

## 2021-03-04 NOTE — Progress Notes (Signed)
   CC: split toenail  HPI:  [name] is a 65 y.o. with PMH as below.   Please see A&P for assessment of the patient's acute and chronic medical conditions.   Stopped bp medications and is only taking one now. Will call with which one this is.    BP Readings from Last 3 Encounters:  03/04/21 (!) 151/86  11/25/20 (!) 151/85  11/07/20 (!) 168/97   Blood pressure meds include norvasc 10 mg qd and olmesartan 40 mg qd although she's not sure what she is taking as she realized the other day she was taking two of the same medication but at different doses. She will call the clinic when she gets home.   A1c today 10.6. Last 8.4 09/2020. Stopped Victoza cause it was making her sick. Metformin gave her abdominal problems. She is currently taking lantus 32U qam only. No polyuria or polydipsia.   She noticed the other day her right 1st toenail was cracked. She did not injure it and has no pain but is worried about it as she is diabetic. The toenail appears to be cracked midway up the nail and is loose. Discussed removing this in the clinic vs. Wrapping it and waiting for it to fall off vs. Podiatry. She would like referral to podiatry.   Past Medical History:  Diagnosis Date  . Carpal tunnel syndrome, bilateral    confirmed on nerve conduction studies  . Chronic hepatitis C ([place])    considered cured 06/2016 post retroviral therapy   . Diabetes mellitus type 2, uncontrolled ([place])   . Diabetic peripheral neuropathy ([place])   . GERD (gastroesophageal reflux disease)   . History of endometriosis   . History of Helicobacter infection 11/07  . Hyperlipidemia   . Liver fibrosis 02/17/2016   F2/3    Review of Systems:   10 point ROS negative except as noted in HPI  Physical Exam: Constitution: NAD, appears stated age HENT: [place]/AT Cardio: RRR, no m/r/g, no LE edema  Respiratory: CTA, no w/r/r MSK: moving all extremities Neuro: normal affect, a&ox3 Skin: c/d/i, 1st metatarsal nail  broken midway up the nail, mobile, NTTP, no erythema, warmth or open wounds   Vitals:   03/04/21 0830 03/04/21 0839  BP: (!) 169/74 (!) 156/91  Pulse: 78 75  Temp: 98.2 F (36.8 C)   TempSrc: Oral   SpO2: 100%   Weight: 175 lb 1.6 oz (79.4 kg)   Height: 4\' 9"  (1.448 m)      Assessment & Plan:   See Encounters Tab for problem based charting.  Patient discussed with Dr. Angelia Mould

## 2021-03-05 ENCOUNTER — Other Ambulatory Visit: Payer: Self-pay

## 2021-03-05 DIAGNOSIS — I152 Hypertension secondary to endocrine disorders: Secondary | ICD-10-CM

## 2021-03-05 DIAGNOSIS — E1159 Type 2 diabetes mellitus with other circulatory complications: Secondary | ICD-10-CM

## 2021-03-05 LAB — BMP8+ANION GAP
Anion Gap: 15 mmol/L (ref 10.0–18.0)
BUN/Creatinine Ratio: 14 (ref 12–28)
BUN: 23 mg/dL (ref 8–27)
CO2: 18 mmol/L — ABNORMAL LOW (ref 20–29)
Calcium: 8.8 mg/dL (ref 8.7–10.3)
Chloride: 105 mmol/L (ref 96–106)
Creatinine, Ser: 1.69 mg/dL — ABNORMAL HIGH (ref 0.57–1.00)
Glucose: 202 mg/dL — ABNORMAL HIGH (ref 65–99)
Potassium: 4.1 mmol/L (ref 3.5–5.2)
Sodium: 138 mmol/L (ref 134–144)
eGFR: 34 mL/min/{1.73_m2} — ABNORMAL LOW (ref >59)

## 2021-03-05 NOTE — Telephone Encounter (Signed)
Returned call to patient. States she forgot to tell Provider yesterday that she no longer goes to [facility]. Requesting BP meds and [facility] [address]. Explained [facility]ade [facility] and [facility] are [facility] for Amlod[facility]. She will call them to get that ready. Will [facility]. HT has been removed from list.

## 2021-03-05 NOTE — Telephone Encounter (Signed)
[facility]s [facility] [address] [place] [phone]

## 2021-03-06 ENCOUNTER — Other Ambulatory Visit: Payer: Self-pay

## 2021-03-06 DIAGNOSIS — L609 Nail disorder, unspecified: Secondary | ICD-10-CM | POA: Insufficient documentation

## 2021-03-06 DIAGNOSIS — Z794 Long term (current) use of insulin: Secondary | ICD-10-CM

## 2021-03-06 DIAGNOSIS — E11319 Type 2 diabetes mellitus with unspecified diabetic retinopathy without macular edema: Secondary | ICD-10-CM

## 2021-03-06 MED ORDER — INSULIN GLARGINE 100 UNIT/ML ~~LOC~~ SOLN: SUBCUTANEOUS | 0 refills | Status: DC

## 2021-03-06 NOTE — Assessment & Plan Note (Addendum)
BP Readings from Last 3 Encounters:  03/04/21 (!) 151/86  11/25/20 (!) 151/85  11/07/20 (!) 168/97   Blood pressure meds include norvasc 10 mg qd and olmesartan 40 mg qd although she's not sure what she is taking as she realized the other day she was taking two of the same medication but at different doses. She will call the clinic when she gets home.   - f/u medications, will titrate once we know what she is on - f/u in one month for blood pressure check   ADDENDUM: patient called from home and states she is taking Dilt xr only. She thinks that is the only blood pressure med she was taking. Discussed need for reconciliation with pharmacist, to which she is agreeable.

## 2021-03-06 NOTE — Assessment & Plan Note (Signed)
She noticed the other day her right 1st toenail was cracked. She did not injure it and has no pain but is worried about it as she is diabetic. The toenail appears to be cracked midway up the nail and is loose. Discussed removing this in the clinic vs. Wrapping it and waiting for it to fall off vs. Podiatry. She would like referral to podiatry.   - referral to podiatry placed

## 2021-03-06 NOTE — Telephone Encounter (Signed)
She was taking multiple incorrect medications, including diltiazem, possibly duplicates. I'm going to have pharmacy review her medications before refilling the benicar so we can be sure of what she is taking.

## 2021-03-06 NOTE — Assessment & Plan Note (Signed)
  A1c today 10.6. Last 8.4 09/2020. Stopped Victoza cause it was making her sick. Metformin gave her abdominal problems. She is currently taking lantus 32U qam only.  - foot exam today - start dapagliflozin 5 mg - in four weeks increase to 10 mg - f/u in three months for a1c - bmp today

## 2021-03-08 NOTE — Progress Notes (Signed)
Internal Medicine Clinic Attending  Case discussed with Dr. Seawell  At the time of the visit.  We reviewed the resident's history and exam and pertinent patient test results.  I agree with the assessment, diagnosis, and plan of care documented in the resident's note.  

## 2021-03-10 DIAGNOSIS — N184 Chronic kidney disease, stage 4 (severe): Secondary | ICD-10-CM | POA: Diagnosis not present

## 2021-03-11 ENCOUNTER — Encounter: Payer: Medicare Other | Admitting: Pharmacist

## 2021-03-17 DIAGNOSIS — R801 Persistent proteinuria, unspecified: Secondary | ICD-10-CM | POA: Diagnosis not present

## 2021-03-17 DIAGNOSIS — E559 Vitamin D deficiency, unspecified: Secondary | ICD-10-CM | POA: Diagnosis not present

## 2021-03-17 DIAGNOSIS — I129 Hypertensive chronic kidney disease with stage 1 through stage 4 chronic kidney disease, or unspecified chronic kidney disease: Secondary | ICD-10-CM | POA: Diagnosis not present

## 2021-03-17 DIAGNOSIS — R768 Other specified abnormal immunological findings in serum: Secondary | ICD-10-CM | POA: Diagnosis not present

## 2021-03-17 DIAGNOSIS — E1122 Type 2 diabetes mellitus with diabetic chronic kidney disease: Secondary | ICD-10-CM | POA: Diagnosis not present

## 2021-03-17 DIAGNOSIS — N184 Chronic kidney disease, stage 4 (severe): Secondary | ICD-10-CM | POA: Diagnosis not present

## 2021-03-20 ENCOUNTER — Ambulatory Visit (INDEPENDENT_AMBULATORY_CARE_PROVIDER_SITE_OTHER): Payer: Medicare Other | Admitting: Pharmacist

## 2021-03-20 DIAGNOSIS — E1169 Type 2 diabetes mellitus with other specified complication: Secondary | ICD-10-CM | POA: Diagnosis not present

## 2021-03-20 DIAGNOSIS — Z79899 Other long term (current) drug therapy: Secondary | ICD-10-CM | POA: Diagnosis not present

## 2021-03-20 DIAGNOSIS — Z794 Long term (current) use of insulin: Secondary | ICD-10-CM

## 2021-03-20 MED ORDER — TRULICITY 0.75 MG/0.5ML ~~LOC~~ SOAJ: 0.7500 mg | SUBCUTANEOUS | 0 refills | Status: DC

## 2021-03-20 NOTE — Progress Notes (Signed)
Subjective/Objective:    Patient ID: Nancy [name], female    DOB: Dec 04, 1956, 65 y.o.   MRN: [id number]  HPI  Patient is a 65 y.o. female who presents for medication review and management.  She is in good spirits and presents without assistance with her mother. Patient was referred and last seen by provider, Dr. Sharon Seller, on 03/04/21.  Patient presented with medications that she is currently taking daily. There was a discrepancy noted in which patient states her nephrologist, Dr. Royce Macadamia, has her taking diltiazem, which was not on our medication list but was in hand today. In addition, patient did not know anything about the amlodipine and has never filled this medication. She states Dr. Royce Macadamia has been managing her blood pressure for quite some time.   Patient's mother states she worries about the patient remembering to take her medications. Patient elaborates that she has trouble remembering if she specifically took her Lantus. She states if she cannot recall she will either take another dose later in the evening or avoid taking again for the rest of the day to avoid her blood glucose potentially dipping low. She is worried about her diabetes management and feels she needs to work towards better controlling it.    Medication Adherence Questionnaire (A score of 2 or more points indicates risk for nonadherence)  Do you know what each of your medicines is for? 1 (1 point if no)  Do you ever have trouble remembering to take your medicine? 2 (2 points if yes)  Do you ever not take a medicine because you feel you do not need it?  0 (1 point if yes)  Do you think that any of your medicines is not helping you? 1 (1 point if yes)  Do you have any physical problems such as vision loss that keep you from taking your medicines as prescribed?  0 (2 points if yes)  Do you think any of your medicine is causing a side effect? 0 (1 point if yes)  Do you know the names of ALL of your medicines? 1 (1 point if  no)  Do you think that you need ALL of your medicines? 0 (1 point if no)  In the past 6 months, have you missed getting a refill or a new prescription filled on time? 0 (1 point if yes)  How often do you miss taking a dose of medicine?  2 Never (0 points), 1 or 2 times a month (1 points), 1 time a week (2 points), 2 or more times a week (3 points).   TOTAL SCORE 7/14    Assessment/Plan:   Understanding of regimen: fair  Understanding of indications: poor  Potential of compliance: good  Patient has known adherence challenges based on score of 7 for questionnaire. Barriers include: lack of knowledge and forgetfulness. Medication list reviewed and updated. Patient was provided with a printed medication list. Attempted to call Dr. Luis Abed office to clarify their med list with the med list we have for patient in [place]. Left voicemail for office and received call back after patient had already left. Dr. Luis Abed assistant, Mechele Claude, stated that patient should be taking diltiazem 240mg  as well as olmesartan 40mg . They do not have the patient on amlodipine. Will consider future addition of amlodipine if patient's blood pressure continues to remain elevated. Called patient to let her know what Dr. Luis Abed office had communicated. -Provided patient pill organizer and recommended she fill it weekly to help her remember if she took all  of her medications. -Recommended patient purchase a calendar and mark on it each day when she takes her Lantus dose to reference later in the day when she cannot recall  T2DM: Patient amendable to trial another GLP-1 as she did not tolerate Victoza in the past and had GI upset. Will trial Trulicity. Patient educated on purpose, proper use and potential adverse effects of Trulicity.  Following instruction patient verbalized understanding of treatment plan. Demonstrated proper administration of Trulicity during visit utilizing demonstration pen. -Start dulaglutide (Trulicity)  0.75mg  once weekly. Called pharmacy and verified coverage of medication. $0 co-pay and patient is aware and will pick up.  Follow-up appointment via telephone in one week to assess tolerability to Trulicity 0.75mg . Written patient instructions provided.  This appointment required 90 minutes of patient care (this includes precharting, chart review, review of results, and face-to-face care).  Thank you for involving pharmacy to assist in providing this patient's care.

## 2021-03-20 NOTE — Patient Instructions (Addendum)
Ms. Novella it was a pleasure seeing you today.   Today we reviewed all of the medications you are currently taking. Included is an updated medication list. Please continue taking all medications as prescribed on this list.  1. START Trulicity 0.75mg  once weekly as directed today during your appointment. If you have any questions or if you believe something has occurred because of this change, call me or your doctor to let one of [place] know.  2. Continue checking blood sugars at home. It's really important that you record these and bring these in to your next doctor's appointment.   To help you remember to take your medications:  - Supplied you with a pill organizer - Purchase a calendar to mark when you take your insulin injections  Hypoglycemia or low blood sugar:   Low blood sugar can happen quickly and may become an emergency if not treated right away.   While this shouldn't happen often, it can be brought upon if you skip a meal or do not eat enough. Also, if your insulin or other diabetes medications are dosed too high, this can cause your blood sugar to go to low.   Warning signs of low blood sugar include: 1. Feeling shaky or dizzy 2. Feeling weak or tired  3. Excessive hunger 4. Feeling anxious or upset  5. Sweating even when you aren't exercising  What to do if I experience low blood sugar? Follow the Rule of 15 1. Check your blood sugar with your meter. If lower than 70, proceed to step 2.  2. Treat with 15 grams of fast acting carbs which is found in 3-4 glucose tablets. If none are available you can try hard candy, 1 tablespoon of sugar or honey,4 ounces of fruit juice, or 6 ounces of REGULAR soda.  3. Re-check your sugar in 15 minutes. If it is still below 70, do what you did in step 2 again. If your blood sugar has come back up, go ahead and eat a snack or small meal made up of complex carbs (ex. Whole grains) and protein at this time to avoid recurrence of low blood  sugar.  If you have any questions please call the clinic and ask to speak with me or call me directly at [phone].  I will call you after speaking with Dr. Royce Macadamia.

## 2021-03-24 ENCOUNTER — Other Ambulatory Visit: Payer: Self-pay

## 2021-03-24 ENCOUNTER — Ambulatory Visit (INDEPENDENT_AMBULATORY_CARE_PROVIDER_SITE_OTHER): Payer: Medicare Other | Admitting: Podiatrist

## 2021-03-24 DIAGNOSIS — L609 Nail disorder, unspecified: Secondary | ICD-10-CM | POA: Diagnosis not present

## 2021-03-24 DIAGNOSIS — M79676 Pain in unspecified toe(s): Secondary | ICD-10-CM

## 2021-03-24 DIAGNOSIS — B351 Tinea unguium: Secondary | ICD-10-CM | POA: Diagnosis not present

## 2021-03-25 ENCOUNTER — Encounter: Payer: Self-pay | Admitting: Podiatrist

## 2021-03-25 NOTE — Progress Notes (Signed)
Chief Complaint  Patient presents with  . Nail Problem    PT states her right great toenail has cracked and a piece came off. Pt states its not painful but she gets a tingling feeling every so often. Pt states she first noticed the crack 2 weeks ago.      HPI: Patient is 65 y.o. female who presents today for the concerns as listed above. She relates she got out of the shower and noticed the right great toenail had partially come off and there was a crack down the center area of the nail.  She relates no pain.  She is concerned of cutting her toenails herself due to diabetes.  She denies any redness or swelling to the toe or foot.  Denies any drainage.  Patient Active Problem List   Diagnosis Date Noted  . Nail disorder, unspecified 03/06/2021  . Back pain 11/07/2020  . Forgetfulness 07/08/2020  . Groin pain (L>R) 07/27/2019  . Hyperlipidemia associated with type 2 diabetes mellitus ([place]) 04/20/2019  . Vaginal atrophy 07/22/2018  . Morbid obesity ([place]) 08/04/2017  . CKD stage 3 due to type 2 diabetes mellitus ([place]) 08/02/2017  . Constipation 09/08/2016  . Muscle spasm 04/23/2016  . Health care maintenance 10/03/2014  . GERD (gastroesophageal reflux disease) 01/16/2014  . Carpal tunnel syndrome, bilateral   . Hypertension associated with diabetes ([place]) 11/09/2006  . Controlled diabetes mellitus with retinopathy ([place]) 12/07/2002    Current Outpatient Medications on File Prior to Visit  Medication Sig Dispense Refill  . ACCU-CHEK FASTCLIX LANCETS MISC Use to check blood sugar up to 3 times a day 306 each 3  . atorvastatin (LIPITOR) 40 MG tablet Take 1 tablet (40 mg total) by mouth daily. 90 tablet 3  . Blood Glucose Monitoring Suppl (ACCU-CHEK GUIDE) w/Device KIT 1 each by Does not apply route 3 (three) times daily. Check blood sugar 3 times a day 1 kit 0  . Cholecalciferol (VITAMIN D3) 25 MCG (1000 UT) CAPS Take 1 capsule by mouth daily.    . cyclobenzaprine (FLEXERIL) 5 MG tablet  Take 1 tablet (5 mg total) by mouth 3 (three) times daily as needed for muscle spasms. 30 tablet 0  . [START ON 04/03/2021] dapagliflozin propanediol (FARXIGA) 10 MG TABS tablet Take 1 tablet (10 mg total) by mouth daily before breakfast. 30 tablet 1  . diclofenac Sodium (VOLTAREN) 1 % GEL Apply 2 g topically 4 (four) times daily. 100 g 2  . DILT-XR 240 MG 24 hr capsule Take 240 mg by mouth daily.    . Dulaglutide (TRULICITY) 1.61 WR/6.0AV SOPN Inject 0.75 mg into the skin once a week. 2 mL 0  . glucose blood (ACCU-CHEK GUIDE) test strip Check blood sugar 3 times a day 300 each 3  . insulin glargine (LANTUS) 100 UNIT/ML injection INJECT 32 UNITS INTO THE SKIN EVERY MORNING WHEN YOU WAKE UP 40 mL 0  . Insulin Syringe-Needle U-100 31G X 15/64" 0.5 ML MISC Use to inject insulin one time a day 100 each 11  . Insulin Syringes, Disposable, U-100 1 ML MISC 2 Syringes by Does not apply route daily. Use to inject insulin into the skin 1 time daily. diag code E11.9. Insulin dependent 100 each 3  . olmesartan (BENICAR) 40 MG tablet Take 1 tablet (40 mg total) by mouth daily. 90 tablet 3  . omeprazole (PRILOSEC) 40 MG capsule Take 1 capsule (40 mg total) by mouth daily. 90 capsule 4  . senna (SENOKOT) 8.6 MG tablet  Take 1 tablet (8.6 mg total) by mouth as needed for constipation. 60 tablet 3   No current facility-administered medications on file prior to visit.    Allergies  Allergen Reactions  . Ace Inhibitors Cough    Persistent dry cough  . Amitriptyline Hcl Nausea And Vomiting  . Aspirin Other (See Comments)  . Latex Itching  . Prednisone Nausea And Vomiting    Review of Systems No fevers, chills, nausea, muscle aches, no difficulty breathing, no calf pain, no chest pain or shortness of breath.   Physical Exam  GENERAL APPEARANCE: Alert, conversant. Appropriately groomed. No acute distress.   VASCULAR: Pedal pulses palpable 1/4 DP and non palpable PT bilateral.  Capillary refill time is  immediate to all digits,  Proximal to distal cooling it warm to warm.  Digital hair growth absent.   NEUROLOGIC: sensation is intact to 5.07 monofilament at 5/5 sites bilateral.  Light touch is intact bilateral, vibratory sensation intact bilateral.  Subjectively relates a tingling type of sensation from time to time.  She states it goes away on its own.    MUSCULOSKELETAL: acceptable muscle strength, tone and stability bilateral.   No pain, crepitus or limitation noted with foot and ankle range of motion bilateral. Brachymetatarsia of the fourth metatarsal left noted. Not symptomatic.   DERMATOLOGIC: skin is warm, supple, and dry.  No open lesions noted.  No rash, no pre ulcerative lesions. Digital nails are thick, discolored, dystrophic, brittle with subungual debris present and clinically mycotic.  Right hallux nail has partially avulsed with a small ridge on the lateral 1/3 longitudinal aspect of the nail.  No redness, swelling, or sign of infection noted.  Remainder of the nail is intact and brittle.     Assessment     ICD-10-CM   1. Pain due to onychomycosis of toenail  B35.1    M79.676   2. Nail disorder, unspecified  L60.9      Plan  Discussed exam findings with the patient and recommended trimming the nails.  The patient agreed and onychoreduction of toenails was accomplished via nail nipper and power burr.  The right hallux ridge was smoothed with a burr as well.  I discussed it may continue to have a small line in the nail but this was nothing to worry about.  I recommended she be seen every 3 months to keep her nails to a safe level especially due to her diabetes.  She agrees and will be seen back for preventive services in 3 months.

## 2021-03-26 ENCOUNTER — Other Ambulatory Visit: Payer: Self-pay | Admitting: Internal Medicine

## 2021-03-26 DIAGNOSIS — E11319 Type 2 diabetes mellitus with unspecified diabetic retinopathy without macular edema: Secondary | ICD-10-CM

## 2021-03-26 DIAGNOSIS — E1169 Type 2 diabetes mellitus with other specified complication: Secondary | ICD-10-CM

## 2021-03-26 DIAGNOSIS — Z794 Long term (current) use of insulin: Secondary | ICD-10-CM

## 2021-03-27 ENCOUNTER — Other Ambulatory Visit: Payer: Self-pay | Admitting: Internal Medicine

## 2021-03-27 ENCOUNTER — Telehealth: Payer: Self-pay | Admitting: Pharmacist

## 2021-03-27 DIAGNOSIS — E1169 Type 2 diabetes mellitus with other specified complication: Secondary | ICD-10-CM

## 2021-03-27 DIAGNOSIS — I152 Hypertension secondary to endocrine disorders: Secondary | ICD-10-CM

## 2021-03-27 DIAGNOSIS — Z794 Long term (current) use of insulin: Secondary | ICD-10-CM

## 2021-03-27 MED ORDER — ATORVASTATIN CALCIUM 40 MG PO TABS: 40.0000 mg | ORAL_TABLET | Freq: Every day | ORAL | 3 refills | Status: DC

## 2021-03-27 MED ORDER — DAPAGLIFLOZIN PROPANEDIOL 10 MG PO TABS: 10.0000 mg | ORAL_TABLET | Freq: Every day | ORAL | 1 refills | Status: DC

## 2021-03-27 NOTE — Telephone Encounter (Signed)
Called patient to discuss tolerability to Trulicity. Patient states has not begun taking yet and she is planning to start tomorrow. Will call her next Friday to follow-up.  On phone patient did state that she ran out of refills of atorvastatin 40mg . [facility] gave her 3 tablets the other day to hold her over but she is now out again. I am sending to team to refill.

## 2021-04-04 ENCOUNTER — Telehealth: Payer: Self-pay | Admitting: Pharmacist

## 2021-04-04 NOTE — Telephone Encounter (Addendum)
Patient called asking if she should continue with her next dose of Trulicity today. Patient reports starting Trulicity last Friday and experiencing nausea and subsequently vomiting. She also reports having difficulty breathing. When asked to explain this further patient states that it felt like being "winded." She denies any swelling of the tongue, difficulty breathing in air, or throat tightness. Also denies any itchiness or redness at injection site. Patient also states she has not had any tachycardia or fever.   Instructed patient to stop taking Trulicity and to discuss further with PCP on Monday.

## 2021-04-07 ENCOUNTER — Ambulatory Visit (INDEPENDENT_AMBULATORY_CARE_PROVIDER_SITE_OTHER): Payer: Medicare Other | Admitting: Student

## 2021-04-07 ENCOUNTER — Other Ambulatory Visit: Payer: Self-pay

## 2021-04-07 ENCOUNTER — Ambulatory Visit (HOSPITAL_COMMUNITY)
Admission: RE | Admit: 2021-04-07 | Discharge: 2021-04-07 | Disposition: A | Discharge status: Home / Self Care | Payer: Medicare Other | Source: Ambulatory Visit | Attending: Internal Medicine | Admitting: Internal Medicine

## 2021-04-07 VITALS — BP 133/81 | HR 72 | Temp 98.3°F | Ht <= 58 in | Wt 172.6 lb

## 2021-04-07 DIAGNOSIS — S99921A Unspecified injury of right foot, initial encounter: Secondary | ICD-10-CM | POA: Insufficient documentation

## 2021-04-07 DIAGNOSIS — Z794 Long term (current) use of insulin: Secondary | ICD-10-CM

## 2021-04-07 DIAGNOSIS — E11319 Type 2 diabetes mellitus with unspecified diabetic retinopathy without macular edema: Secondary | ICD-10-CM

## 2021-04-07 DIAGNOSIS — M79671 Pain in right foot: Secondary | ICD-10-CM | POA: Diagnosis not present

## 2021-04-07 DIAGNOSIS — M62838 Other muscle spasm: Secondary | ICD-10-CM

## 2021-04-07 MED ORDER — CYCLOBENZAPRINE HCL 10 MG PO TABS: 10.0000 mg | ORAL_TABLET | Freq: Two times a day (BID) | ORAL | 0 refills | Status: DC | PRN

## 2021-04-07 NOTE — Progress Notes (Signed)
   CC: right foot injury  HPI:  [name] is a 65 y.o. female with history listed below presenting to the [facility] for right foot injury. Please see individualized problem based charting for full HPI.  Past Medical History:  Diagnosis Date  . Carpal tunnel syndrome, bilateral    confirmed on nerve conduction studies  . Chronic hepatitis C ([place])    considered cured 06/2016 post retroviral therapy   . Diabetes mellitus type 2, uncontrolled ([place])   . Diabetic peripheral neuropathy ([place])   . GERD (gastroesophageal reflux disease)   . History of endometriosis   . History of Helicobacter infection 11/07  . Hyperlipidemia   . Liver fibrosis 02/17/2016   F2/3     Review of Systems:  Negative aside from that listed in individualized problem based charting.  Physical Exam:  Vitals:   04/07/21 1037 04/07/21 1046  BP: (!) 129/94 133/81  Pulse: 76 72  Temp: 98.3 F (36.8 C)   TempSrc: Oral   SpO2: 100%   Weight: 172 lb 9.6 oz (78.3 kg)   Height: 4\' 9"  (1.448 m)    Physical Exam Constitutional:      Appearance: She is obese. She is not ill-appearing.  HENT:     Head: Normocephalic and atraumatic.     Nose: Nose normal. No congestion.  Eyes:     Extraocular Movements: Extraocular movements intact.     Conjunctiva/sclera: Conjunctivae normal.     Pupils: Pupils are equal, round, and reactive to light.  Cardiovascular:     Rate and Rhythm: Normal rate and regular rhythm.     Pulses: Normal pulses.     Heart sounds: Normal heart sounds. No murmur heard. No friction rub. No gallop.   Pulmonary:     Effort: Pulmonary effort is normal.     Breath sounds: Normal breath sounds. No wheezing, rhonchi or rales.  Abdominal:     General: Bowel sounds are normal. There is no distension.     Palpations: Abdomen is soft.     Tenderness: There is no abdominal tenderness.  Musculoskeletal:     Cervical back: Normal range of motion.     Comments: Right lateral ankle swelling and  tenderness at base of right foot. Eversion, inversion, flexion and extension of right foot limited due to pain and swelling. No redness or erythema noted.  Skin:    General: Skin is warm and dry.  Neurological:     General: No focal deficit present.     Mental Status: She is alert and oriented to person, place, and time.  Psychiatric:        Mood and Affect: Mood normal.        Behavior: Behavior normal.      Assessment & Plan:   See Encounters Tab for problem based charting.  Patient discussed with Dr. Dareen Piano

## 2021-04-07 NOTE — Patient Instructions (Signed)
Ms Wivell,  It was a pleasure seeing you in the clinic today.   1. Please continue taking your trulicity. If you develop sudden shortness of breath after taking it, then please call 911 and do not take it again.  2. For your right foot injury, I have ordered an x-ray for it. In the meantime, please continue applying ice, elevating your foot, and apply an ACE wrap for support. Use tylenol as needed for pain control.  Please call our clinic at [phone] if you have any questions or concerns. The best time to call is Monday-Friday from 9am-4pm, but there is someone available 24/7 at the same number. If you need medication refills, please notify your pharmacy one week in advance and they will send [place] a request.   Thank you for letting us take part in your care. We look forward to seeing you next time!

## 2021-04-07 NOTE — Assessment & Plan Note (Signed)
Patient reports following into a ditch while trying to go get groceries a few days ago.  States that her right foot got hurt when she fell.  Denies any bleeding or crush injury.  She is able to bear weight on her right foot but does have pain when she does so.  On exam, ROM is limited in terms of eversion, flexion, inversion, extension of the right foot due to pain and swelling.  Her right lateral ankle is swollen but with no redness or erythema.  She states that she has been applying ice, elevating her right foot as needed.  Discussed applying Ace wrap and using Tylenol as needed for pain control.  Will obtain right foot x-rays today to further evaluate and rule out fracture (although this is unlikely given she is able to bear weight on foot).  If no acute fracture, expect for right foot pain and swelling to heal with supportive care. -Ice, elevation, Ace wraps, Tylenol as needed -Follow-up right foot x-rays

## 2021-04-07 NOTE — Assessment & Plan Note (Signed)
Reports that muscle spasms are not well controlled with current dosing of Flexeril (5 mg 3 times daily as needed).  She is requesting for additional muscle relaxants to better control muscle spasms.  Discussed increasing dose of Flexeril to 10 mg but limiting frequency to twice daily as needed to see how her muscle spasms are controlled with this dosing.  She will be coming back in 8 weeks for repeat hemoglobin A1c testing and can assess at that time whether or not muscle spasms are controlled.  If not, can consider changing from Flexeril to another muscle relaxant such as Robaxin. -Flexeril 10 mg twice daily as needed

## 2021-04-07 NOTE — Assessment & Plan Note (Signed)
Patient with history of type 2 diabetes on Lantus 32 units, dapagliflozin 10 mg, Trulicity 0.86 mg weekly. Last HbA1c 10.6 (03/04/2021). Came in today with concern of side effects from Trulicity.  She had her first dose last Friday and reports becoming nauseous, and having heavy breathing and fatigue as well.  However, she reports feeling better 2 days after and states that her blood glucose levels were better controlled after having started Trulicity.  Discussed that nausea is common when starting Trulicity and typically improves with subsequent doses.  Her heavy breathing seems to be related to the nausea and she denies any acute shortness of breath.  Discussed continuing Trulicity with anticipation of improvement of symptoms but emphasized discontinuing Trulicity should she develop any acute shortness of breath and to also call 911 if this occurs.  -Lantus 32 units daily -Dapagliflozin 10 mg daily -Trulicity 5.78 mg weekly -Repeat hemoglobin A1c testing in 8 weeks

## 2021-04-10 NOTE — Progress Notes (Signed)
Internal Medicine Clinic Attending  Case discussed with Dr. Jinwala  At the time of the visit.  We reviewed the resident's history and exam and pertinent patient test results.  I agree with the assessment, diagnosis, and plan of care documented in the resident's note.  

## 2021-04-11 ENCOUNTER — Telehealth: Payer: Self-pay

## 2021-04-11 NOTE — Telephone Encounter (Signed)
Requesting x-ray results, please call pt back.

## 2021-04-11 NOTE — Telephone Encounter (Signed)
Contacted patient regarding imaging results. No fracture or dislocation noted. Advised to continue current management.

## 2021-05-06 ENCOUNTER — Encounter: Payer: Self-pay | Admitting: Student

## 2021-05-06 ENCOUNTER — Ambulatory Visit (INDEPENDENT_AMBULATORY_CARE_PROVIDER_SITE_OTHER): Payer: Medicare Other | Admitting: Student

## 2021-05-06 VITALS — BP 140/76 | HR 74 | Temp 98.3°F | Wt 172.0 lb

## 2021-05-06 DIAGNOSIS — N183 Chronic kidney disease, stage 3 unspecified: Secondary | ICD-10-CM

## 2021-05-06 DIAGNOSIS — E11319 Type 2 diabetes mellitus with unspecified diabetic retinopathy without macular edema: Secondary | ICD-10-CM | POA: Diagnosis not present

## 2021-05-06 DIAGNOSIS — E1122 Type 2 diabetes mellitus with diabetic chronic kidney disease: Secondary | ICD-10-CM | POA: Diagnosis not present

## 2021-05-06 DIAGNOSIS — Z794 Long term (current) use of insulin: Secondary | ICD-10-CM | POA: Diagnosis not present

## 2021-05-06 DIAGNOSIS — S99921A Unspecified injury of right foot, initial encounter: Secondary | ICD-10-CM

## 2021-05-06 NOTE — Assessment & Plan Note (Signed)
Patient presented earlier this month with right foot injury.  Advised conservative therapy and ordered right foot x-rays.  The x-rays came back negative and plan was to continue conservative therapy.  Today she reports significant improvement in her right foot pain and she is able to bear weight per usual now and this is no longer affecting her daily life.

## 2021-05-06 NOTE — Assessment & Plan Note (Addendum)
Patient with history of type 2 diabetes, on Lantus 32 units daily, dapagliflozin 10 mg daily, Trulicity 8.41 mg weekly.  Her last A1c was 10.6 on 03/04/2021.  She reports that she has been tolerating Trulicity well since her last visit and has not had recurrence of becoming nauseous and heavy breathing.  Also states that her blood glucose levels have been more stable since starting the Trulicity.  Unfortunately she did not bring her glucometer so unable to assess her blood glucose levels at this time.  She is due for repeat A1c in 1 month.  Obtaining a urine microalbumin to creatinine ratio as this has not been obtained for a few years now.  Plan: -Continue current medications -Follow-up urine microalbumin to creatinine ratio -Repeat A1c in 1 month -Advised to bring glucometer at next visit

## 2021-05-06 NOTE — Patient Instructions (Signed)
Ms Norell,  It was a pleasure seeing you in the clinic today.   1. We are getting a lab test today, I will call you with the results.  2. Please come back in 1 month to get your hemoglobin A1c test.  Please call our clinic at [phone] if you have any questions or concerns. The best time to call is Monday-Friday from 9am-4pm, but there is someone available 24/7 at the same number. If you need medication refills, please notify your pharmacy one week in advance and they will send [place] a request.   Thank you for letting us take part in your care. We look forward to seeing you next time!

## 2021-05-06 NOTE — Progress Notes (Signed)
   CC: f/u of T2DM  HPI:  [name] is a 65 y.o. female with history listed below presenting to the [facility] for f/u of T2DM. Please see individualized problem based charting for full HPI.  Past Medical History:  Diagnosis Date  . Carpal tunnel syndrome, bilateral    confirmed on nerve conduction studies  . Chronic hepatitis C ([place])    considered cured 06/2016 post retroviral therapy   . Diabetes mellitus type 2, uncontrolled ([place])   . Diabetic peripheral neuropathy ([place])   . GERD (gastroesophageal reflux disease)   . History of endometriosis   . History of Helicobacter infection 11/07  . Hyperlipidemia   . Liver fibrosis 02/17/2016   F2/3     Review of Systems:  Negative aside from that listed in individualized problem based charting.  Physical Exam:  Vitals:   05/06/21 0955  BP: (!) 147/81  Pulse: 100  Temp: 98.3 F (36.8 C)  TempSrc: Oral  SpO2: 100%  Weight: 172 lb (78 kg)   Physical Exam Constitutional:      Appearance: She is obese.  HENT:     Mouth/Throat:     Mouth: Mucous membranes are moist.     Pharynx: Oropharynx is clear. No oropharyngeal exudate.  Eyes:     Extraocular Movements: Extraocular movements intact.     Conjunctiva/sclera: Conjunctivae normal.     Pupils: Pupils are equal, round, and reactive to light.  Cardiovascular:     Rate and Rhythm: Normal rate and regular rhythm.     Pulses: Normal pulses.     Heart sounds: Normal heart sounds. No murmur heard. No friction rub. No gallop.   Pulmonary:     Effort: Pulmonary effort is normal.     Breath sounds: Normal breath sounds. No wheezing, rhonchi or rales.  Abdominal:     General: Bowel sounds are normal. There is no distension.     Palpations: Abdomen is soft.     Tenderness: There is no abdominal tenderness.  Musculoskeletal:        General: No swelling. Normal range of motion.     Cervical back: Normal range of motion.  Skin:    General: Skin is warm and dry.  Neurological:      General: No focal deficit present.     Mental Status: She is alert and oriented to person, place, and time.  Psychiatric:        Mood and Affect: Mood normal.        Behavior: Behavior normal.      Assessment & Plan:   See Encounters Tab for problem based charting.  Patient discussed with Dr. Evette Doffing

## 2021-05-07 NOTE — Progress Notes (Signed)
Internal Medicine Clinic Attending  Case discussed with Dr. Jinwala  At the time of the visit.  We reviewed the resident's history and exam and pertinent patient test results.  I agree with the assessment, diagnosis, and plan of care documented in the resident's note.  

## 2021-05-08 LAB — MICROALBUMIN / CREATININE URINE RATIO
Creatinine, Urine: 112.5 mg/dL
Microalb/Creat Ratio: 1741 mg/g creat — ABNORMAL HIGH (ref 0–29)
Microalbumin, Urine: 1958.6 ug/mL

## 2021-05-21 ENCOUNTER — Other Ambulatory Visit: Payer: Self-pay | Admitting: Student

## 2021-05-21 DIAGNOSIS — Z1231 Encounter for screening mammogram for malignant neoplasm of breast: Secondary | ICD-10-CM

## 2021-05-23 ENCOUNTER — Other Ambulatory Visit: Payer: Self-pay | Admitting: Student

## 2021-06-02 ENCOUNTER — Encounter: Payer: Medicare Other | Admitting: Internal Medicine

## 2021-06-05 ENCOUNTER — Encounter: Payer: Self-pay | Admitting: Student

## 2021-06-05 ENCOUNTER — Ambulatory Visit (INDEPENDENT_AMBULATORY_CARE_PROVIDER_SITE_OTHER): Payer: Medicare Other | Admitting: Student

## 2021-06-05 ENCOUNTER — Other Ambulatory Visit: Payer: Self-pay

## 2021-06-05 VITALS — BP 123/81 | HR 72 | Temp 98.5°F | Ht <= 58 in | Wt 170.4 lb

## 2021-06-05 DIAGNOSIS — Z0000 Encounter for general adult medical examination without abnormal findings: Secondary | ICD-10-CM

## 2021-06-05 DIAGNOSIS — Z794 Long term (current) use of insulin: Secondary | ICD-10-CM | POA: Diagnosis not present

## 2021-06-05 DIAGNOSIS — E11319 Type 2 diabetes mellitus with unspecified diabetic retinopathy without macular edema: Secondary | ICD-10-CM | POA: Diagnosis not present

## 2021-06-05 LAB — POCT GLYCOSYLATED HEMOGLOBIN (HGB A1C): Hemoglobin A1C: 6.8 % — AB (ref 4.0–5.6)

## 2021-06-05 LAB — GLUCOSE, CAPILLARY: Glucose-Capillary: 74 mg/dL (ref 70–99)

## 2021-06-05 MED ORDER — TRULICITY 1.5 MG/0.5ML ~~LOC~~ SOAJ: 1.5000 mg | SUBCUTANEOUS | 1 refills | Status: DC

## 2021-06-05 MED ORDER — INSULIN GLARGINE 100 UNIT/ML ~~LOC~~ SOLN
SUBCUTANEOUS | 0 refills | Status: DC
Start: 2021-06-05 — End: 2021-08-13

## 2021-06-05 NOTE — Patient Instructions (Addendum)
It was a pleasure seeing you in clinic. Today we discussed:   Diabetes: You A1c is much improved today! You are doing a great job. Continue using Trulicity 9.13 weekly and Lantus 32 units daily until you run out of your Trulicity  Then switch to   Trulicity 1.5 mg weekly and Lantus 22 units daily  Follow up in 3 months for diabetes follow up.  If you have any questions or concerns, please call our clinic at [phone] between 9am-5pm and after hours call [phone] and ask for the internal medicine resident on call. If you feel you are having a medical emergency please call 911.   Thank you, we look forward to helping you remain healthy!

## 2021-06-06 NOTE — Assessment & Plan Note (Signed)
Patient presents for diabetes follow. On Lantus 32 units daily, dapaglifozin 10 mg daily, and trulicity 5.05 mg weekly. A1c much improved to 6.8 from 10.6 3 months ago. Notes GI symptoms on trulicity have no returned.  Unfortunately she did not bring her meter to OV today. Reports blood sugars generally in 120s in the morning. Notes one episodes of low in 69 about 1 month ago but has not had any since then. Today glucose of 74, notes she did not eat this morning yet. Notes she has been trying to lose weight and eat less. Stressed importance of eating regular meals to help prevent low sugars. Discussed lowering insulin and increasing trulicity for diabetes and weight loss benefit and preventing hypoglycemia. She is agreeable, but has 6 doses left of current Trulicity prescription and would like to continue with current dosage until she runs out to prevent confusion.  Plan - Increase trulicity of 1.5mg  weekly once out of 0.75 mg pens - Decrease lantus to 22 units daily after increasing up GLP-1 - Continue dapagliflozin - follow up A1c in 3 months

## 2021-06-06 NOTE — Progress Notes (Signed)
   CC: Diabetes follow up  HPI:  Nancy [name] is a 65 y.o. female with past medical history listed below presents for diabetes follow up. Please refer to problem based charting for further details and assessment and plan of current problem and chronic medical conditions.   Past Medical History:  Diagnosis Date   Carpal tunnel syndrome, bilateral    confirmed on nerve conduction studies   Chronic hepatitis C ([place])    considered cured 06/2016 post retroviral therapy    Diabetes mellitus type 2, uncontrolled ([place])    Diabetic peripheral neuropathy (H[facility])    GERD (gastroesophageal reflux disease)    History of endometriosis    History of Helicobacter infection 11/07   Hyperlipidemia    Liver fibrosis 02/17/2016   F2/3    Review of Systems: Negative as per HPI  Physical Exam:  Vitals:   06/05/21 1353  BP: 123/81  Pulse: 72  Temp: 98.5 F (36.9 C)  TempSrc: Oral  SpO2: 100%  Weight: 77.3 kg  Height: 4\' 9"  (1.448 m)   Constitutional: Appears well-developed and well-nourished. No distress.  HENT: Normocephalic and atraumatic Cardiovascular: Normal rate, regular rhythm. Distal pulses intact Respiratory: Effort is normal.  Lungs are clear to auscultation bilaterally. GI: Nondistended, soft, nontender to palpation, normal active bowel sounds Musculoskeletal: Normal bulk and tone.  No peripheral edema noted. Neurological: Is alert and oriented x4, no apparent focal deficits noted. Skin: Warm and dry.  No rash, erythema, lesions noted. Psychiatric: Normal mood and affect. Behavior is normal. Judgment and thought content normal.    Assessment & Plan:   See Encounters Tab for problem based charting.  Patient discussed with Dr. Daryll Drown

## 2021-06-06 NOTE — Assessment & Plan Note (Deleted)
Shingles vaccine script given, patient will check if she can find an affordable pharmacy

## 2021-06-10 DIAGNOSIS — N184 Chronic kidney disease, stage 4 (severe): Secondary | ICD-10-CM | POA: Diagnosis not present

## 2021-06-12 NOTE — Progress Notes (Signed)
Internal Medicine Clinic Attending ? ?Case discussed with Dr. Liang  At the time of the visit.  We reviewed the resident?s history and exam and pertinent patient test results.  I agree with the assessment, diagnosis, and plan of care documented in the resident?s note. ? ?

## 2021-06-14 ENCOUNTER — Other Ambulatory Visit: Payer: Self-pay | Admitting: Internal Medicine

## 2021-06-16 DIAGNOSIS — R768 Other specified abnormal immunological findings in serum: Secondary | ICD-10-CM | POA: Diagnosis not present

## 2021-06-16 DIAGNOSIS — I129 Hypertensive chronic kidney disease with stage 1 through stage 4 chronic kidney disease, or unspecified chronic kidney disease: Secondary | ICD-10-CM | POA: Diagnosis not present

## 2021-06-16 DIAGNOSIS — N2581 Secondary hyperparathyroidism of renal origin: Secondary | ICD-10-CM | POA: Diagnosis not present

## 2021-06-16 DIAGNOSIS — R801 Persistent proteinuria, unspecified: Secondary | ICD-10-CM | POA: Diagnosis not present

## 2021-06-16 DIAGNOSIS — E1122 Type 2 diabetes mellitus with diabetic chronic kidney disease: Secondary | ICD-10-CM | POA: Diagnosis not present

## 2021-06-16 DIAGNOSIS — N184 Chronic kidney disease, stage 4 (severe): Secondary | ICD-10-CM | POA: Diagnosis not present

## 2021-06-30 ENCOUNTER — Ambulatory Visit: Payer: Medicare Other | Admitting: Podiatry

## 2021-06-30 ENCOUNTER — Telehealth: Payer: Self-pay | Admitting: *Deleted

## 2021-06-30 NOTE — Telephone Encounter (Signed)
Patient is calling to cancel her appointment for today, informed that they may charge for an same day cancellation , something else came up. Please call to reschedule.

## 2021-07-03 ENCOUNTER — Ambulatory Visit: Payer: Medicare Other

## 2021-07-03 ENCOUNTER — Ambulatory Visit
Admission: RE | Admit: 2021-07-03 | Discharge: 2021-07-03 | Disposition: A | Discharge status: Home / Self Care | Payer: Medicare Other | Source: Ambulatory Visit

## 2021-07-03 ENCOUNTER — Other Ambulatory Visit: Payer: Self-pay

## 2021-07-03 DIAGNOSIS — Z1231 Encounter for screening mammogram for malignant neoplasm of breast: Secondary | ICD-10-CM

## 2021-07-15 DIAGNOSIS — E113293 Type 2 diabetes mellitus with mild nonproliferative diabetic retinopathy without macular edema, bilateral: Secondary | ICD-10-CM | POA: Diagnosis not present

## 2021-07-15 LAB — HM DIABETES EYE EXAM

## 2021-07-24 ENCOUNTER — Other Ambulatory Visit: Payer: Self-pay | Admitting: Student

## 2021-07-24 DIAGNOSIS — K219 Gastro-esophageal reflux disease without esophagitis: Secondary | ICD-10-CM

## 2021-08-04 ENCOUNTER — Other Ambulatory Visit: Payer: Self-pay | Admitting: Internal Medicine

## 2021-08-04 DIAGNOSIS — E11319 Type 2 diabetes mellitus with unspecified diabetic retinopathy without macular edema: Secondary | ICD-10-CM

## 2021-08-09 ENCOUNTER — Other Ambulatory Visit: Payer: Self-pay | Admitting: Internal Medicine

## 2021-08-18 ENCOUNTER — Encounter: Payer: Medicare Other | Admitting: Student

## 2021-08-21 ENCOUNTER — Encounter: Payer: Self-pay | Admitting: Internal Medicine

## 2021-08-21 ENCOUNTER — Ambulatory Visit (INDEPENDENT_AMBULATORY_CARE_PROVIDER_SITE_OTHER): Payer: Medicare Other | Admitting: Internal Medicine

## 2021-08-21 ENCOUNTER — Other Ambulatory Visit: Payer: Self-pay

## 2021-08-21 VITALS — BP 150/99 | HR 90 | Temp 98.2°F | Resp 28 | Ht <= 58 in | Wt 165.9 lb

## 2021-08-21 DIAGNOSIS — E1159 Type 2 diabetes mellitus with other circulatory complications: Secondary | ICD-10-CM | POA: Diagnosis not present

## 2021-08-21 DIAGNOSIS — Z794 Long term (current) use of insulin: Secondary | ICD-10-CM | POA: Diagnosis not present

## 2021-08-21 DIAGNOSIS — I152 Hypertension secondary to endocrine disorders: Secondary | ICD-10-CM

## 2021-08-21 DIAGNOSIS — F439 Reaction to severe stress, unspecified: Secondary | ICD-10-CM | POA: Diagnosis not present

## 2021-08-21 DIAGNOSIS — E11319 Type 2 diabetes mellitus with unspecified diabetic retinopathy without macular edema: Secondary | ICD-10-CM | POA: Diagnosis not present

## 2021-08-21 MED ORDER — AMLODIPINE BESYLATE 5 MG PO TABS: 5.0000 mg | ORAL_TABLET | Freq: Every day | ORAL | 11 refills | Status: DC

## 2021-08-21 MED ORDER — INSULIN GLARGINE 100 UNIT/ML ~~LOC~~ SOLN: SUBCUTANEOUS | 1 refills | Status: DC

## 2021-08-21 NOTE — Progress Notes (Signed)
   CC: Medication refill  HPI:  [name] is a 65 y.o. person, with a PMH noted below, who presents to the clinic medication refill . To see the management of their acute and chronic conditions, please see the A&P note under the Encounters tab.   Past Medical History:  Diagnosis Date   Carpal tunnel syndrome, bilateral    confirmed on nerve conduction studies   Chronic hepatitis C ([place])    considered cured 06/2016 post retroviral therapy    Diabetes mellitus type 2, uncontrolled ([place])    Diabetic peripheral neuropathy (H[facility])    GERD (gastroesophageal reflux disease)    History of endometriosis    History of Helicobacter infection 11/07   Hyperlipidemia    Liver fibrosis 02/17/2016   F2/3    Review of Systems:   Review of Systems  Constitutional:  Negative for chills, fever, malaise/fatigue and weight loss.  Respiratory:  Negative for cough and hemoptysis.   Cardiovascular:  Negative for chest pain.  Gastrointestinal:  Negative for abdominal pain, constipation, diarrhea, nausea and vomiting.    Physical Exam:  Vitals:   08/21/21 1046  BP: (!) 150/99  Pulse: 90  Resp: (!) 28  Temp: 98.2 F (36.8 C)  TempSrc: Oral  SpO2: 100%  Weight: 165 lb 14.4 oz (75.3 kg)  Height: 4\' 9"  (1.448 m)   Physical Exam Vitals and nursing note reviewed.  Constitutional:      General: She is not in acute distress.    Appearance: Normal appearance. She is not ill-appearing or toxic-appearing.  HENT:     Head: Normocephalic and atraumatic.  Cardiovascular:     Rate and Rhythm: Normal rate and regular rhythm.     Pulses: Normal pulses.     Heart sounds: Normal heart sounds. No murmur heard.   No friction rub. No gallop.  Pulmonary:     Effort: Pulmonary effort is normal.     Breath sounds: Normal breath sounds. No wheezing, rhonchi or rales.  Abdominal:     General: Bowel sounds are normal.     Palpations: Abdomen is soft.     Tenderness: There is no abdominal tenderness. There  is no guarding.  Musculoskeletal:        General: No swelling.     Right lower leg: No edema.     Left lower leg: No edema.  Neurological:     General: No focal deficit present.     Mental Status: She is alert and oriented to person, place, and time.  Psychiatric:        Mood and Affect: Mood normal.        Behavior: Behavior normal.     Assessment & Plan:   See Encounters Tab for problem based charting.  Patient discussed with Dr. Daryll Drown

## 2021-08-21 NOTE — Patient Instructions (Signed)
Ms. Leinbach,   It was a pleasure meeting you today! Today we discussed your medication refills, blood pressure, and pressures at home.   For your medications, I will refill your Lantus today. We will check your blood sugars at your next visit in 2-3 weeks.   For your blood pressure, please continue taking your olmesartan. I will also add another medication called amlodipine that you will take one time a day. We will recheck your blood pressure in 2-3 weeks.   And for your pressures at home, I will reach out to our behavioral specialist so you can meet.   I hope you have a good day, and we will see you in 2-3 weeks!  Maudie Mercury, MD

## 2021-08-22 DIAGNOSIS — F439 Reaction to severe stress, unspecified: Secondary | ICD-10-CM | POA: Insufficient documentation

## 2021-08-22 NOTE — Assessment & Plan Note (Addendum)
Patient presents today for refill on her insulin.  She is currently taking Lantus 22 units nightly, Trulicity 1.5 mg, and dapagliflozin 10 mg.  She is currently out of her Lantus, did not bring her glucose monitor with her today.  She also states that even though she is on Trulicity, she has not experienced any weight loss. Discussed that she has been 10 pounds down since March of this year, which she was excited to learn.  Assessment: Patient presents today for diabetes check, unfortunately she does not have her meter with her and does not recall her CBGs.  She does need a refill on her Lantus she is currently out.  We discussed having her A1c checked in the next 2 to 3 weeks which she is agreeable. - Refill Lantus - Continue current regimen - Bring CBG monitor to next visit - Follow-up in 2 to 3 weeks for A1c

## 2021-08-22 NOTE — Assessment & Plan Note (Signed)
Patient presents to clinic with a history of hypertension associated with diabetes.  Her pressures are 150/99 today.  She states that she is currently taking olmesartan 40 mg daily.  Hypertension is currently uncontrolled.  Assessment: Patient presents with uncontrolled hypertension, currently taking olmesartan 40 mg.  She was stressed today (see stress at home), which is likely contributing to her hypertension.  We will start low-dose amlodipine today and follow-up in 2 to 3 weeks. - Continue olmesartan 40 mg daily - Start amlodipine 5 mg daily

## 2021-08-22 NOTE — Assessment & Plan Note (Addendum)
Patient presents today with feelings of stress and anxiety concerning taking care of her partner.  She states that 3 years ago he suffered from a stroke, and she has been a sole caretaker since. She states that his mother and 2 sisters are in [place], but do not contribute to his care.  She states that he has refused aides and nurses because that would make him feel "like he is handicapped."  She cooks for him, helps him bathe, helps him get dressed, and cleans his apartment.  They do live in the same apartment building.  Nancy [name] states his mother will now start watching her significant other so she can get groceries for him, but if she is gone longer than 15 to 20 minutes she will start getting calls from his mother telling her to get back home to take care of him.  His family states that if she were to leave him, they would send him to a nursing facility, which is not set well with Nancy [name], who is a family be been taking care of somebody with they are down.  She was hoping that once he returned back to baseline, she could exit the relationship but recently he had meningitis with a prolonged hospital stay and is worse than he was before.  She is beginning to feel fatigued, mentally worn down, and has feelings of guilt whenever she leaves to go back to her apartment at night because he is at the apartment by himself.  She does note though that 1 time she did leave because he upset her while she was dressing him, and when she returned he was fully dressed, which makes her feel used.  Assessment: Patient presents with feelings of stress in the setting of her relationship. She has no help with family, and his sole provider, but with no assistance from his family she is worried that if she were to exit the relationship, they would send him to a nursing facility.  Discussed different modalities of stress management, and patient would like to speak with a counselor.  Will refer to [facility]. - Ambulatory referral  to [facility]

## 2021-08-23 ENCOUNTER — Other Ambulatory Visit: Payer: Self-pay | Admitting: Internal Medicine

## 2021-08-23 DIAGNOSIS — Z794 Long term (current) use of insulin: Secondary | ICD-10-CM

## 2021-08-30 NOTE — Progress Notes (Signed)
Internal Medicine Clinic Attending ? ?Case discussed with Dr. Winters  At the time of the visit.  We reviewed the resident?s history and exam and pertinent patient test results.  I agree with the assessment, diagnosis, and plan of care documented in the resident?s note.  ?

## 2021-09-01 DIAGNOSIS — N184 Chronic kidney disease, stage 4 (severe): Secondary | ICD-10-CM | POA: Diagnosis not present

## 2021-09-02 ENCOUNTER — Other Ambulatory Visit: Payer: Self-pay | Admitting: Student

## 2021-09-02 NOTE — Telephone Encounter (Signed)
Next appt scheduled 09/11/21 with PCP.

## 2021-09-02 NOTE — Telephone Encounter (Signed)
Refill Request   cyclobenzaprine (FLEXERIL) 10 MG tablet  [id number] [place], [place] [address] (Ph: [phone])

## 2021-09-03 ENCOUNTER — Encounter: Payer: Self-pay | Admitting: Student

## 2021-09-10 DIAGNOSIS — R801 Persistent proteinuria, unspecified: Secondary | ICD-10-CM | POA: Diagnosis not present

## 2021-09-10 DIAGNOSIS — N2581 Secondary hyperparathyroidism of renal origin: Secondary | ICD-10-CM | POA: Diagnosis not present

## 2021-09-10 DIAGNOSIS — N184 Chronic kidney disease, stage 4 (severe): Secondary | ICD-10-CM | POA: Diagnosis not present

## 2021-09-10 DIAGNOSIS — E1122 Type 2 diabetes mellitus with diabetic chronic kidney disease: Secondary | ICD-10-CM | POA: Diagnosis not present

## 2021-09-10 DIAGNOSIS — I129 Hypertensive chronic kidney disease with stage 1 through stage 4 chronic kidney disease, or unspecified chronic kidney disease: Secondary | ICD-10-CM | POA: Diagnosis not present

## 2021-09-10 DIAGNOSIS — R768 Other specified abnormal immunological findings in serum: Secondary | ICD-10-CM | POA: Diagnosis not present

## 2021-09-11 ENCOUNTER — Encounter: Payer: Self-pay | Admitting: *Deleted

## 2021-09-11 ENCOUNTER — Encounter: Payer: Self-pay | Admitting: Student

## 2021-09-11 ENCOUNTER — Ambulatory Visit (INDEPENDENT_AMBULATORY_CARE_PROVIDER_SITE_OTHER): Payer: Medicare Other | Admitting: Student

## 2021-09-11 VITALS — BP 143/80 | HR 79 | Temp 98.2°F | Resp 32 | Ht <= 58 in | Wt 161.8 lb

## 2021-09-11 DIAGNOSIS — N183 Chronic kidney disease, stage 3 unspecified: Secondary | ICD-10-CM

## 2021-09-11 DIAGNOSIS — E1122 Type 2 diabetes mellitus with diabetic chronic kidney disease: Secondary | ICD-10-CM | POA: Diagnosis not present

## 2021-09-11 DIAGNOSIS — E11319 Type 2 diabetes mellitus with unspecified diabetic retinopathy without macular edema: Secondary | ICD-10-CM

## 2021-09-11 DIAGNOSIS — I152 Hypertension secondary to endocrine disorders: Secondary | ICD-10-CM | POA: Diagnosis not present

## 2021-09-11 DIAGNOSIS — E1159 Type 2 diabetes mellitus with other circulatory complications: Secondary | ICD-10-CM | POA: Diagnosis not present

## 2021-09-11 DIAGNOSIS — Z794 Long term (current) use of insulin: Secondary | ICD-10-CM

## 2021-09-11 LAB — POCT GLYCOSYLATED HEMOGLOBIN (HGB A1C): Hemoglobin A1C: 6.7 % — AB (ref 4.0–5.6)

## 2021-09-11 LAB — GLUCOSE, CAPILLARY: Glucose-Capillary: 135 mg/dL — ABNORMAL HIGH (ref 70–99)

## 2021-09-11 NOTE — Progress Notes (Unsigned)

## 2021-09-11 NOTE — Assessment & Plan Note (Signed)
Patient with history of HTN, previously on olmesartan 40mg  daily. She was started on norvasc 5mg  last visit for better BP control, but patient was on diltiazem-XR per Dr. Royce Macadamia (nephrology) and we were unable to review that additional medication.  She was able to visit Dr. Royce Macadamia yesterday, who removed norvasc from medication list. Dr. Royce Macadamia also decreased patient's olmesartan from 40mg  to 20mg  due to worsening kidney function (per patient).   Today BP is 143/80 on olmesartan 20mg  daily and diltiazem-XR 240mg  daily. I think this may be an appropriate goal for patient at this time.  Plan: -continue olmesartan 20mg  daily and diltiazem-XR 240mg  daily -f/u with Dr. Royce Macadamia in 1 week

## 2021-09-11 NOTE — Patient Instructions (Signed)
Ms. Berhow,  It was a pleasure seeing you in the clinic today.   Your A1c looks good again today. Keep up the good work! Your blood pressure is better today. Please take readings at home so we know how it stays there as well.  Please follow up with Dr. Royce Macadamia in 1 week. Your next visit with [place] will be in 3 months.  Please call our clinic at [phone] if you have any questions or concerns. The best time to call is Monday-Friday from 9am-4pm, but there is someone available 24/7 at the same number. If you need medication refills, please notify your pharmacy one week in advance and they will send [place] a request.   Thank you for letting us take part in your care. We look forward to seeing you next time!

## 2021-09-11 NOTE — Assessment & Plan Note (Signed)
Patient with recent visit with Dr. Royce Macadamia. I am unable to access those records. However, patient notes that Dr. Royce Macadamia spoke with her about worsening kidney function and for her to consider options of kidney transplant vs dialysis (should she not be a candidate) in the future. She reports that she does not want dialysis at this time and is not ready to consider that at this time.   I discussed the potential options in detail with her and she appeared to have given it more thought. She reports that she may have been shocked to hear it from Dr. Royce Macadamia and was unable to appropriately ask questions at that time, but will do so during her follow up visit with Dr. Royce Macadamia in 1 week.

## 2021-09-11 NOTE — Progress Notes (Signed)
   CC: f/u of T2DM and HTN  HPI:  [name] is a 65 y.o. female with history listed below presenting to the [facility] for f/u of T2DM and HTN. Please see individualized problem based charting for full HPI.  Past Medical History:  Diagnosis Date   Carpal tunnel syndrome, bilateral    confirmed on nerve conduction studies   Chronic hepatitis C ([place])    considered cured 06/2016 post retroviral therapy    Diabetes mellitus type 2, uncontrolled    Diabetic peripheral neuropathy ([facility])    GERD (gastroesophageal reflux disease)    History of endometriosis    History of Helicobacter infection 11/07   Hyperlipidemia    Liver fibrosis 02/17/2016   F2/3     Review of Systems:  Negative aside from that listed in individualized problem based charting.  Physical Exam:  Vitals:   09/11/21 1316  BP: (!) 143/80  Pulse: 79  Resp: (!) 32  Temp: 98.2 F (36.8 C)  TempSrc: Oral  SpO2: 100%  Weight: 161 lb 12.8 oz (73.4 kg)  Height: 4\' 9"  (1.448 m)   Physical Exam Constitutional:      Appearance: She is obese. She is not ill-appearing.  HENT:     Nose: Nose normal. No congestion.     Mouth/Throat:     Mouth: Mucous membranes are moist.     Pharynx: Oropharynx is clear. No oropharyngeal exudate.  Eyes:     Extraocular Movements: Extraocular movements intact.     Conjunctiva/sclera: Conjunctivae normal.     Pupils: Pupils are equal, round, and reactive to light.  Cardiovascular:     Rate and Rhythm: Normal rate and regular rhythm.     Pulses: Normal pulses.     Heart sounds: Normal heart sounds.  Pulmonary:     Effort: Pulmonary effort is normal.     Breath sounds: Normal breath sounds. No wheezing, rhonchi or rales.  Musculoskeletal:        General: No swelling. Normal range of motion.     Cervical back: Normal range of motion.  Skin:    General: Skin is warm and dry.  Neurological:     General: No focal deficit present.     Mental Status: She is alert and oriented to  person, place, and time.  Psychiatric:        Mood and Affect: Mood normal.        Behavior: Behavior normal.     Assessment & Plan:   See Encounters Tab for problem based charting.  Patient discussed with Dr.  Saverio Danker

## 2021-09-11 NOTE — Assessment & Plan Note (Signed)
Patient with history of T2DM, on lantus 82I qhs, trulicity 1.5mg  weekly, and farxiga 10mg  daily.  Last A1c was 6.8%, today A1c 6.7%. Commended patient on optimal glycemic control.   Plan: -continue current medications -repeat A1c in 3 months

## 2021-09-12 NOTE — Progress Notes (Signed)
Internal Medicine Clinic Attending  Case discussed with Dr. Jinwala  At the time of the visit.  We reviewed the resident's history and exam and pertinent patient test results.  I agree with the assessment, diagnosis, and plan of care documented in the resident's note.  

## 2021-09-16 ENCOUNTER — Telehealth: Payer: Self-pay | Admitting: Behavioral Health

## 2021-09-16 ENCOUNTER — Institutional Professional Consult (permissible substitution): Payer: Medicare Other | Admitting: Behavioral Health

## 2021-09-16 NOTE — Telephone Encounter (Signed)
Contacted Pt for today's [facility] telehealth session @ 2:30pm. Pt sts she has a home full of ppl & would like to r/s so she can speak in private. Agreed to r/s Pt to more convenient time.  Dr. Theodis Shove

## 2021-10-01 ENCOUNTER — Institutional Professional Consult (permissible substitution): Payer: Medicare Other | Admitting: Behavioral Health

## 2021-10-01 ENCOUNTER — Telehealth: Payer: Self-pay | Admitting: Behavioral Health

## 2021-10-01 NOTE — Telephone Encounter (Signed)
Discussed ICD w/Pt today & arranged a f:f time that works for Pt in Nov. Pt prefers to meet in person due to her lack of privacy on the phone & presence of Sig Other.  Dr. Theodis Shove

## 2021-10-06 ENCOUNTER — Other Ambulatory Visit: Payer: Self-pay | Admitting: Student

## 2021-10-06 DIAGNOSIS — I152 Hypertension secondary to endocrine disorders: Secondary | ICD-10-CM

## 2021-10-09 ENCOUNTER — Ambulatory Visit (INDEPENDENT_AMBULATORY_CARE_PROVIDER_SITE_OTHER): Payer: Medicare Other | Admitting: *Deleted

## 2021-10-09 DIAGNOSIS — Z23 Encounter for immunization: Secondary | ICD-10-CM

## 2021-10-16 ENCOUNTER — Ambulatory Visit: Payer: Medicare Other | Admitting: Behavioral Health

## 2021-10-22 ENCOUNTER — Other Ambulatory Visit: Payer: Self-pay

## 2021-10-22 DIAGNOSIS — E11319 Type 2 diabetes mellitus with unspecified diabetic retinopathy without macular edema: Secondary | ICD-10-CM

## 2021-10-22 MED ORDER — ACCU-CHEK GUIDE VI STRP: ORAL_STRIP | 3 refills | Status: DC

## 2021-10-22 NOTE — Telephone Encounter (Signed)
Requesting to speak with a nurse about getting test strip to be filled. Please call pt back.

## 2021-10-24 ENCOUNTER — Other Ambulatory Visit: Payer: Self-pay | Admitting: Student

## 2021-10-24 DIAGNOSIS — Z794 Long term (current) use of insulin: Secondary | ICD-10-CM

## 2021-11-03 DIAGNOSIS — N1832 Chronic kidney disease, stage 3b: Secondary | ICD-10-CM | POA: Diagnosis not present

## 2021-11-10 ENCOUNTER — Other Ambulatory Visit: Payer: Self-pay | Admitting: Student

## 2021-11-11 ENCOUNTER — Telehealth: Payer: Self-pay

## 2021-11-11 NOTE — Telephone Encounter (Signed)
cyclobenzaprine (FLEXERIL) 10 MG tablet, [facility] @ [id number] [place], [place]

## 2021-11-11 NOTE — Telephone Encounter (Signed)
Pt called / informed [facility]

## 2021-11-12 ENCOUNTER — Ambulatory Visit: Payer: Medicare Other | Admitting: Behavioral Health

## 2021-11-12 DIAGNOSIS — F331 Major depressive disorder, recurrent, moderate: Secondary | ICD-10-CM

## 2021-11-12 DIAGNOSIS — F419 Anxiety disorder, unspecified: Secondary | ICD-10-CM

## 2021-11-12 NOTE — BH Specialist Note (Signed)
Integrated Behavioral Health Initial In-Person Visit  MRN: [id number] Name: Nancy [name]  Number of [place] Clinician visits:: 1/6 Session Start time: 11:00am  Session End time: 11:45am Total time: 45  minutes  Types of Service: Family [place] Promotion  Interpretor:No. Interpretor Name and Language: n/a    Warm Hand Off Completed.        Subjective: Nancy [name] is a 65 y.o. female accompanied by Mother Patient was referred by Dr. Virl Axe, MD for anx/dep related to health status changes w/Pt's DM. Patient reports the following symptoms/concerns: 1st visit f:f today since Referral & 2 calls to Pt starting in Oct 2022. Duration of problem: months; Severity of problem: moderate & pertaining to Pt psychological process of denial/avoidance which has reduced Pt acceptance of uncertainty in her health status.  Objective: Mood: Angry, Anxious, and Depressed and Affect: Appropriate and Tearful Risk of harm to self or others: No plan to harm self or others  Life Context: Family and Social: Pt lives in [place]. She is close to her Mother & they do everything tgthr. Mother is present today & very helpful/supportive/encouraging. School/Work: Pt does not attend School & unk work status @ this time Self-Care: Pt is attending to her DM in all ways. She is upset today, reporting the Physician has f/u with her & sts her Kidneys are in bad shape. Her appt w/Dr. Royce Macadamia @ [facility] is Thur, 11/13/2021. Life Changes: Health status changes that frighten Pt & are cause for her anxiety.  Patient and/or Family's Strengths/Protective Factors: Social connections, Social and Patent attorney, [place] supports in place (healthy food, safe environments, etc.), Sense of purpose, and Physical Health (exercise, healthy diet, medication compliance, etc.)  Goals Addressed: Patient will: Reduce symptoms of: agitation, anxiety, and depression Increase  knowledge and/or ability of: coping skills, self-management skills, and stress reduction to reduce cortisol levels & inc Pt control over her DM Demonstrate ability to: Increase healthy adjustment to current life circumstances  Progress towards Goals: Estb'd today; Pt will attend psychotherapy sessions to assist in her adjustment to current health status situation & Pt will schedule w/Donna Plyler, RD for expert current info about DM  Interventions: Interventions utilized: Solution-Focused Strategies and Supportive Counseling  Standardized Assessments completed:  screeners prn  Patient and/or Family Response: Pt receptive to visit today, although expressing much dissatisfaction w/her health care for her DM. Pt reports she is overwhelmed w/the current state of her Kidney functioning & fearful of dialysis.  Patient Centered Plan: Patient is on the following Treatment Plan(s):  Pt will update herself on recent dvlpmts on DM & how to manage. Pt will cont psychotherapy prn & call to schedule as she feels necessary.  Assessment: Patient currently experiencing elevated anx/dep & agitation w/her health status.    Patient may benefit from psychotherapy sessions to maintain lower levels of stress, adjust to current results which will be delivered to her by Dr. Royce Macadamia on [place], Nov 13, 2021.  Plan: Follow up with behavioral health clinician on : prn per Pt request Behavioral recommendations: None @ this time Referral(s): [place] (In Clinic) prn "From scale of 1-10, how likely are you to follow plan?": n/a  Donnetta Hutching, LMFT

## 2021-11-13 DIAGNOSIS — E1122 Type 2 diabetes mellitus with diabetic chronic kidney disease: Secondary | ICD-10-CM | POA: Diagnosis not present

## 2021-11-13 DIAGNOSIS — N184 Chronic kidney disease, stage 4 (severe): Secondary | ICD-10-CM | POA: Diagnosis not present

## 2021-11-13 DIAGNOSIS — I129 Hypertensive chronic kidney disease with stage 1 through stage 4 chronic kidney disease, or unspecified chronic kidney disease: Secondary | ICD-10-CM | POA: Diagnosis not present

## 2021-11-13 DIAGNOSIS — R768 Other specified abnormal immunological findings in serum: Secondary | ICD-10-CM | POA: Diagnosis not present

## 2021-11-13 DIAGNOSIS — N2581 Secondary hyperparathyroidism of renal origin: Secondary | ICD-10-CM | POA: Diagnosis not present

## 2021-11-13 DIAGNOSIS — R801 Persistent proteinuria, unspecified: Secondary | ICD-10-CM | POA: Diagnosis not present

## 2021-11-18 ENCOUNTER — Encounter (HOSPITAL_COMMUNITY): Payer: Self-pay | Admitting: Emergency Medicine

## 2021-11-18 ENCOUNTER — Ambulatory Visit (HOSPITAL_COMMUNITY)
Admission: EM | Admit: 2021-11-18 | Discharge: 2021-11-18 | Disposition: A | Discharge status: Home / Self Care | Payer: Medicare Other | Attending: Internal Medicine | Admitting: Internal Medicine

## 2021-11-18 ENCOUNTER — Ambulatory Visit (INDEPENDENT_AMBULATORY_CARE_PROVIDER_SITE_OTHER): Payer: Medicare Other

## 2021-11-18 ENCOUNTER — Other Ambulatory Visit: Payer: Self-pay

## 2021-11-18 DIAGNOSIS — S40012A Contusion of left shoulder, initial encounter: Secondary | ICD-10-CM

## 2021-11-18 DIAGNOSIS — M25512 Pain in left shoulder: Secondary | ICD-10-CM

## 2021-11-18 DIAGNOSIS — S161XXA Strain of muscle, fascia and tendon at neck level, initial encounter: Secondary | ICD-10-CM

## 2021-11-18 DIAGNOSIS — S4992XA Unspecified injury of left shoulder and upper arm, initial encounter: Secondary | ICD-10-CM | POA: Diagnosis not present

## 2021-11-18 MED ORDER — BACLOFEN 10 MG PO TABS: 10.0000 mg | ORAL_TABLET | Freq: Three times a day (TID) | ORAL | 0 refills | Status: DC
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
## 2021-11-18 NOTE — Discharge Instructions (Addendum)
Your xray does not show any broken bones, only mild arthritis. Follow up with your primary care next week, I believe you could benefit from physical therapy to help you fully heal. Apply heat on areas of pain for 15 minutes, 2-4 times a day.  I sent a muscle relaxer to help with spasms and pain.  You may take Tylenol  500 mg every 4-6 hours for pain.
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
## 2021-12-10 ENCOUNTER — Ambulatory Visit (INDEPENDENT_AMBULATORY_CARE_PROVIDER_SITE_OTHER): Payer: Medicare Other | Admitting: Internal Medicine

## 2021-12-10 VITALS — BP 151/70 | HR 71 | Temp 98.0°F | Ht <= 58 in | Wt 156.6 lb

## 2021-12-10 DIAGNOSIS — M79602 Pain in left arm: Secondary | ICD-10-CM

## 2021-12-10 MED ORDER — ACETAMINOPHEN 500 MG PO TABS: 1000.0000 mg | ORAL_TABLET | Freq: Three times a day (TID) | ORAL | 2 refills | Status: AC | PRN

## 2021-12-10 MED ORDER — LIDOCAINE 5 % EX PTCH: 1.0000 | MEDICATED_PATCH | Freq: Two times a day (BID) | CUTANEOUS | 0 refills | Status: DC

## 2021-12-10 MED ORDER — DICLOFENAC SODIUM 1 % EX GEL
4.0000 g | Freq: Three times a day (TID) | CUTANEOUS | Status: DC | PRN
Start: 2021-12-10 — End: 2023-04-16

## 2021-12-10 NOTE — Patient Instructions (Addendum)
Dear Mrs. Montanaro,   Thank you for trustin gus with your care. Today we evaluated you for ongoing arm pain. Please use tylenol 1000mg  three times daily as needed for pain. We will also give you voltaren gel to use 3 times daily as needed. We will also give you lidocaine patches to help with the pain. We have placed a referral to physical therapy to help with mobility and strength exercises. Lastly, we have placed an order to have an x-ray done of your neck to further evaluate this ongoing pain.   Please return to the clinic in 1 month for a follow up.

## 2021-12-10 NOTE — Progress Notes (Addendum)
° °  CC: arm pain  HPI:Ms.Carrye Goller is a 66 y.o. female who presents for evaluation of arm pain. Please see individual problem based A/P for details.   Depression, PHQ-9: Based on the patients  [place] Visit from 12/10/2021 in [place]  PHQ-9 Total Score 13      score we have 13.  Past Medical History:  Diagnosis Date   Carpal tunnel syndrome, bilateral    confirmed on nerve conduction studies   Chronic hepatitis C ([place])    considered cured 06/2016 post retroviral therapy    Diabetes mellitus type 2, uncontrolled    Diabetic peripheral neuropathy (HCC)    GERD (gastroesophageal reflux disease)    History of endometriosis    History of Helicobacter infection 11/07   Hyperlipidemia    Liver fibrosis 02/17/2016   F2/3    Review of Systems:   Review of Systems  Constitutional: Negative.   HENT: Negative.    Respiratory: Negative.    Gastrointestinal: Negative.   Genitourinary: Negative.   Musculoskeletal: Negative.   Skin: Negative.   Neurological:  Positive for tingling and weakness.  Endo/Heme/Allergies: Negative.   Psychiatric/Behavioral: Negative.      Physical Exam: Vitals:   12/10/21 1419  BP: (!) 151/70  Pulse: 71  Temp: 98 F (36.7 C)  TempSrc: Oral  SpO2: 100%  Weight: 156 lb 9.6 oz (71 kg)  Height: 4\' 9"  (1.448 m)     General: alert and oriented, no acute distress HEENT: Conjunctiva nl , antiicteric sclerae, moist mucous membranes, no exudate or erythema Cardiovascular: Normal rate, regular rhythm.  No murmurs, rubs, or gallops Pulmonary : Equal breath sounds, No wheezes, rales, or rhonchi Abdominal: soft, nontender,  bowel sounds present Ext: No edema in lower extremities, no tenderness to palpation of lower extremities. Left arm with limited ROM 2/2 pain. Tender to palpation throughout length of arm. Edema in forearm with no erythema. Pulses intact bilaterally. Grip strength left significantly decreased 2/2  pain.   Assessment & Plan:   See Encounters Tab for problem based charting.  Patient seen with Dr.  Saverio Danker

## 2021-12-13 ENCOUNTER — Encounter: Payer: Self-pay | Admitting: Internal Medicine

## 2021-12-13 DIAGNOSIS — M79602 Pain in left arm: Secondary | ICD-10-CM | POA: Insufficient documentation
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
## 2021-12-18 ENCOUNTER — Other Ambulatory Visit: Payer: Self-pay | Admitting: Internal Medicine

## 2021-12-18 ENCOUNTER — Other Ambulatory Visit: Payer: Self-pay

## 2021-12-18 ENCOUNTER — Ambulatory Visit (HOSPITAL_COMMUNITY)
Admission: RE | Admit: 2021-12-18 | Discharge: 2021-12-18 | Disposition: A | Discharge status: Home / Self Care | Payer: Medicare Other | Source: Ambulatory Visit | Attending: Internal Medicine | Admitting: Internal Medicine

## 2021-12-18 DIAGNOSIS — M79602 Pain in left arm: Secondary | ICD-10-CM | POA: Diagnosis not present

## 2021-12-18 DIAGNOSIS — R202 Paresthesia of skin: Secondary | ICD-10-CM | POA: Diagnosis not present

## 2021-12-18 DIAGNOSIS — E1159 Type 2 diabetes mellitus with other circulatory complications: Secondary | ICD-10-CM

## 2021-12-19 ENCOUNTER — Encounter: Payer: Self-pay | Admitting: Internal Medicine

## 2021-12-19 NOTE — Progress Notes (Signed)
Internal Medicine Clinic Attending  I saw and evaluated the patient.  I personally confirmed the key portions of the history and exam documented by Dr. Gawaluck and I reviewed pertinent patient test results.  The assessment, diagnosis, and plan were formulated together and I agree with the documentation in the resident's note.  

## 2021-12-21 ENCOUNTER — Other Ambulatory Visit: Payer: Self-pay | Admitting: Student

## 2021-12-21 DIAGNOSIS — Z794 Long term (current) use of insulin: Secondary | ICD-10-CM

## 2021-12-21 DIAGNOSIS — E1169 Type 2 diabetes mellitus with other specified complication: Secondary | ICD-10-CM

## 2021-12-25 ENCOUNTER — Other Ambulatory Visit: Payer: Self-pay | Admitting: Student

## 2021-12-25 ENCOUNTER — Other Ambulatory Visit: Payer: Self-pay | Admitting: Internal Medicine

## 2021-12-25 DIAGNOSIS — E11319 Type 2 diabetes mellitus with unspecified diabetic retinopathy without macular edema: Secondary | ICD-10-CM

## 2021-12-25 DIAGNOSIS — E1159 Type 2 diabetes mellitus with other circulatory complications: Secondary | ICD-10-CM

## 2021-12-25 NOTE — Telephone Encounter (Signed)
Next appt scheduled 01/07/22 with PCP.

## 2021-12-26 ENCOUNTER — Other Ambulatory Visit: Payer: Self-pay | Admitting: Student

## 2021-12-31 DIAGNOSIS — R69 Illness, unspecified: Secondary | ICD-10-CM | POA: Diagnosis not present

## 2022-01-01 ENCOUNTER — Encounter: Payer: Medicare Other | Admitting: Internal Medicine

## 2022-01-05 ENCOUNTER — Ambulatory Visit: Payer: Medicare Other | Attending: Internal Medicine | Admitting: Physical Therapy

## 2022-01-05 ENCOUNTER — Other Ambulatory Visit: Payer: Self-pay

## 2022-01-05 DIAGNOSIS — M79602 Pain in left arm: Secondary | ICD-10-CM | POA: Diagnosis not present

## 2022-01-05 DIAGNOSIS — M6281 Muscle weakness (generalized): Secondary | ICD-10-CM | POA: Diagnosis not present
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
## 2022-01-06 ENCOUNTER — Ambulatory Visit (INDEPENDENT_AMBULATORY_CARE_PROVIDER_SITE_OTHER): Payer: Medicare Other | Admitting: Internal Medicine

## 2022-01-06 ENCOUNTER — Encounter: Payer: Self-pay | Admitting: Internal Medicine

## 2022-01-06 ENCOUNTER — Other Ambulatory Visit: Payer: Self-pay

## 2022-01-06 VITALS — BP 161/93 | HR 68 | Temp 98.4°F | Ht <= 58 in | Wt 158.6 lb

## 2022-01-06 DIAGNOSIS — E1122 Type 2 diabetes mellitus with diabetic chronic kidney disease: Secondary | ICD-10-CM

## 2022-01-06 DIAGNOSIS — Z794 Long term (current) use of insulin: Secondary | ICD-10-CM

## 2022-01-06 DIAGNOSIS — E1159 Type 2 diabetes mellitus with other circulatory complications: Secondary | ICD-10-CM | POA: Diagnosis not present

## 2022-01-06 DIAGNOSIS — E11319 Type 2 diabetes mellitus with unspecified diabetic retinopathy without macular edema: Secondary | ICD-10-CM | POA: Diagnosis not present

## 2022-01-06 DIAGNOSIS — N183 Chronic kidney disease, stage 3 unspecified: Secondary | ICD-10-CM

## 2022-01-06 DIAGNOSIS — M79602 Pain in left arm: Secondary | ICD-10-CM | POA: Diagnosis not present

## 2022-01-06 DIAGNOSIS — I152 Hypertension secondary to endocrine disorders: Secondary | ICD-10-CM | POA: Diagnosis not present

## 2022-01-06 LAB — POCT GLYCOSYLATED HEMOGLOBIN (HGB A1C): Hemoglobin A1C: 6.1 % — AB (ref 4.0–5.6)

## 2022-01-06 LAB — GLUCOSE, CAPILLARY: Glucose-Capillary: 119 mg/dL — ABNORMAL HIGH (ref 70–99)

## 2022-01-06 MED ORDER — DICLOFENAC SODIUM 1 % EX GEL: 2.0000 g | Freq: Four times a day (QID) | CUTANEOUS | 0 refills | Status: DC

## 2022-01-06 NOTE — Patient Instructions (Addendum)
Dear Nancy [name],  Today we discussed your arm pain, blood pressure, diabetes, and kidney disease.  For the arm pain, please continue working with physical therapy. Take tylenol and use the lidocaine patches. I have sent in a prescription for voltaren gel.  For your diabetes, please continue taking your medications. We will check your a1c, if your blood sugar is too high, we may increase your Trulicity slightly.   For you blood pressure and kidney disease, I am going to reach out to your kidney specialist and call them to formulate a plan together.  Please return in 3 months for a check up on your chronic issues.

## 2022-01-06 NOTE — Progress Notes (Signed)
° °  CC: arm pain follow up  HPI:[name] is a 66 y.o. female who presents for evaluation of arm pain. Please see individual problem based A/P for details.  Depression, PHQ-9: Based on the patients  [place] Visit from 01/06/2022 in [place]  PHQ-9 Total Score 10      score we have 10.  Past Medical History:  Diagnosis Date   Carpal tunnel syndrome, bilateral    confirmed on nerve conduction studies   Chronic hepatitis C ([place])    considered cured 06/2016 post retroviral therapy    Diabetes mellitus type 2, uncontrolled    Diabetic peripheral neuropathy (HCC)    GERD (gastroesophageal reflux disease)    History of endometriosis    History of Helicobacter infection 11/07   Hyperlipidemia    Liver fibrosis 02/17/2016   F2/3    Review of Systems:   Review of Systems  Constitutional: Negative.   HENT: Negative.    Eyes: Negative.   Respiratory: Negative.    Cardiovascular: Negative.   Gastrointestinal: Negative.   Genitourinary: Negative.   Musculoskeletal:        Arm pain  Skin: Negative.   Neurological: Negative.   Endo/Heme/Allergies: Negative.   Psychiatric/Behavioral: Negative.      Physical Exam: Vitals:   01/06/22 1001 01/06/22 1013  BP: (!) 161/86 (!) 161/93  Pulse: 72 68  Temp: 98.4 F (36.9 C)   TempSrc: Oral   SpO2: 100%   Weight: 158 lb 9.6 oz (71.9 kg)   Height: 4\' 9"  (1.448 m)      General: alert and oriented HEENT: Conjunctiva nl , antiicteric sclerae, moist mucous membranes, no exudate or erythema Cardiovascular: Normal rate, regular rhythm.  No murmurs, rubs, or gallops Pulmonary : Equal breath sounds, No wheezes, rales, or rhonchi Abdominal: soft, nontender,  bowel sounds present Ext: No edema in lower extremities. Tenderness to palpation left upper extremity. Decreased grip strength left 2/2 pain.  Assessment & Plan:   See Encounters Tab for problem based charting.  Patient seen with Dr.   Cain Sieve

## 2022-01-07 ENCOUNTER — Encounter: Payer: Medicare Other | Admitting: Student

## 2022-01-07 ENCOUNTER — Encounter: Payer: Self-pay | Admitting: Internal Medicine

## 2022-01-07 LAB — BMP8+ANION GAP
Anion Gap: 16 mmol/L (ref 10.0–18.0)
BUN/Creatinine Ratio: 13 (ref 12–28)
BUN: 29 mg/dL — ABNORMAL HIGH (ref 8–27)
CO2: 22 mmol/L (ref 20–29)
Calcium: 9.4 mg/dL (ref 8.7–10.3)
Chloride: 103 mmol/L (ref 96–106)
Creatinine, Ser: 2.31 mg/dL — ABNORMAL HIGH (ref 0.57–1.00)
Glucose: 114 mg/dL — ABNORMAL HIGH (ref 70–99)
Potassium: 3.9 mmol/L (ref 3.5–5.2)
Sodium: 141 mmol/L (ref 134–144)
eGFR: 23 mL/min/{1.73_m2} — ABNORMAL LOW (ref >59)

## 2022-01-07 NOTE — Assessment & Plan Note (Addendum)
Patient voices frustration over progression of her CKD. She reports that she has been meeting with her nephrologist at [facility] and that she begun discussions regarding kidney transplant versus dialysis. She is still producing adequate clear urine.  BMP checked showing creatinine 2.3 and GFR 20's. Known proteinuria.  Will obtain record from [facility].

## 2022-01-07 NOTE — Assessment & Plan Note (Signed)
Presented for follow up on arm pain. She has been working with physical therapy and had first session day before OV and is not sure if it is helping yet. Reports soreness after PT. Still endorsing weakness in left arm due to pain, but this is unchanged from prior. Imaging of arm and c-spine has been negative and reassuring. No further workup at this time. Continue PT, tylenol, lidocaine patches, and voltaren gel.

## 2022-01-07 NOTE — Assessment & Plan Note (Signed)
Patient has been taking olmesartan and diltiazem for blood pressure control. Amlodipine was tried in the past but discontinued by nephrology.  Blood pressure uncontrolled today. She is asymptomatic.  Will discuss with her nephrologist how best to optimize blood pressure regimen.

## 2022-01-07 NOTE — Assessment & Plan Note (Signed)
Patient reports CBG typically 130 and usually doesn't go over 180. She Does not have her glucometer with her at [place]. A1c checked and 6.1 at goal. DM controlled. Continue current regimen.

## 2022-01-09 NOTE — Progress Notes (Signed)
Internal Medicine Clinic Attending  I saw and evaluated the patient.  I personally confirmed the key portions of the history and exam documented by Dr. Elliot Gurney and I reviewed pertinent patient test results.  The assessment, diagnosis, and plan were formulated together and I agree with the documentation in the residents note.   The most important next step is for Nancy [name] to talk to the patient's Nephrologist, and Dr. Elliot Gurney is going to reach out to them. I'd like to control her blood pressure better, and Amlodipine is a reasonable option for her in my opinion. However, patient said that her nephrologist told her to stop taking it, so I'd like to get more info from Nephro before adding it back. In the meantime, she's scheduled to see Transplant Nephrology in the next month.

## 2022-01-12 DIAGNOSIS — N184 Chronic kidney disease, stage 4 (severe): Secondary | ICD-10-CM | POA: Diagnosis not present

## 2022-01-19 DIAGNOSIS — H16223 Keratoconjunctivitis sicca, not specified as Sjogren's, bilateral: Secondary | ICD-10-CM | POA: Diagnosis not present

## 2022-01-21 DIAGNOSIS — R801 Persistent proteinuria, unspecified: Secondary | ICD-10-CM | POA: Diagnosis not present

## 2022-01-21 DIAGNOSIS — I129 Hypertensive chronic kidney disease with stage 1 through stage 4 chronic kidney disease, or unspecified chronic kidney disease: Secondary | ICD-10-CM | POA: Diagnosis not present

## 2022-01-21 DIAGNOSIS — N2581 Secondary hyperparathyroidism of renal origin: Secondary | ICD-10-CM | POA: Diagnosis not present

## 2022-01-21 DIAGNOSIS — E1122 Type 2 diabetes mellitus with diabetic chronic kidney disease: Secondary | ICD-10-CM | POA: Diagnosis not present

## 2022-01-21 DIAGNOSIS — R768 Other specified abnormal immunological findings in serum: Secondary | ICD-10-CM | POA: Diagnosis not present

## 2022-01-21 DIAGNOSIS — N184 Chronic kidney disease, stage 4 (severe): Secondary | ICD-10-CM | POA: Diagnosis not present

## 2022-01-23 ENCOUNTER — Other Ambulatory Visit: Payer: Self-pay | Admitting: Student

## 2022-01-23 DIAGNOSIS — Z794 Long term (current) use of insulin: Secondary | ICD-10-CM

## 2022-01-23 DIAGNOSIS — E1169 Type 2 diabetes mellitus with other specified complication: Secondary | ICD-10-CM

## 2022-01-26 DIAGNOSIS — E1122 Type 2 diabetes mellitus with diabetic chronic kidney disease: Secondary | ICD-10-CM | POA: Diagnosis not present

## 2022-01-26 DIAGNOSIS — Z01818 Encounter for other preprocedural examination: Secondary | ICD-10-CM | POA: Diagnosis not present

## 2022-01-26 DIAGNOSIS — N184 Chronic kidney disease, stage 4 (severe): Secondary | ICD-10-CM | POA: Diagnosis not present

## 2022-01-26 DIAGNOSIS — N186 End stage renal disease: Secondary | ICD-10-CM | POA: Diagnosis not present

## 2022-01-26 DIAGNOSIS — E11319 Type 2 diabetes mellitus with unspecified diabetic retinopathy without macular edema: Secondary | ICD-10-CM | POA: Diagnosis not present

## 2022-01-26 DIAGNOSIS — I129 Hypertensive chronic kidney disease with stage 1 through stage 4 chronic kidney disease, or unspecified chronic kidney disease: Secondary | ICD-10-CM | POA: Diagnosis not present

## 2022-02-09 ENCOUNTER — Encounter: Payer: Self-pay | Admitting: Emergency Medicine

## 2022-02-09 ENCOUNTER — Other Ambulatory Visit: Payer: Self-pay

## 2022-02-09 ENCOUNTER — Ambulatory Visit (INDEPENDENT_AMBULATORY_CARE_PROVIDER_SITE_OTHER): Payer: Medicare Other | Admitting: Emergency Medicine

## 2022-02-09 VITALS — BP 130/70 | HR 71 | Ht <= 58 in | Wt 159.0 lb

## 2022-02-09 DIAGNOSIS — N184 Chronic kidney disease, stage 4 (severe): Secondary | ICD-10-CM | POA: Diagnosis not present

## 2022-02-09 DIAGNOSIS — E1159 Type 2 diabetes mellitus with other circulatory complications: Secondary | ICD-10-CM

## 2022-02-09 DIAGNOSIS — Z7689 Persons encountering health services in other specified circumstances: Secondary | ICD-10-CM | POA: Diagnosis not present

## 2022-02-09 DIAGNOSIS — I152 Hypertension secondary to endocrine disorders: Secondary | ICD-10-CM

## 2022-02-09 NOTE — Patient Instructions (Signed)
Chronic Kidney Disease, Adult Chronic kidney disease is when lasting damage happens to the kidneys slowly over a long time. The kidneys help to: Make pee (urine). Make hormones. Keep the right amount of fluids and chemicals in the body. Most often, this disease does not go away. You must take steps to help keep the kidney damage from getting worse. If steps are not taken, the kidneys might stop working forever. What are the causes? Diabetes. High blood pressure. Diseases that affect the heart and blood vessels. Other kidney diseases. Diseases of the body's disease-fighting system. A problem with the flow of pee. Infections of the organs that make pee, store it, and take it out of the body. Swelling or irritation of your blood vessels. What increases the risk? Getting older. Having someone in your family who has kidney disease or kidney failure. Having a disease caused by genes. Taking medicines often that harm the kidneys. Being near or having contact with harmful substances. Being very overweight. Using tobacco now or in the past. What are the signs or symptoms? Feeling very tired. Having a swollen face, legs, ankles, or feet. Feeling like you may vomit or vomiting. Not feeling hungry. Being confused or not able to focus. Twitches and cramps in the leg muscles or other muscles. Dry, itchy skin. A taste of metal in your mouth. Making less pee, or making more pee. Shortness of breath. Trouble sleeping. You may also become anemic or get weak bones. Anemic means there is not enough red blood cells or hemoglobin in your blood. You may get symptoms slowly. You may not notice them until the kidney damage gets very bad. How is this treated? Often, there is no cure for this disease. Treatment can help with symptoms and help keep the disease from getting worse. You may need to: Avoid alcohol. Avoid foods that are high in salt, potassium, phosphorous, and protein. Take medicines for  symptoms and to help control other conditions. Have dialysis. This treatment gets harmful waste out of your body. Treat other problems that cause your kidney disease or make it worse. Follow these instructions at home: Medicines Take over-the-counter and prescription medicines only as told by your doctor. Do not take any new medicines, vitamins, or supplements unless your doctor says it is okay. Lifestyle  Do not smoke or use any products that contain nicotine or tobacco. If you need help quitting, ask your doctor. If you drink alcohol: Limit how much you use to: 0-1 drink a day for women who are not pregnant. 0-2 drinks a day for men. Know how much alcohol is in your drink. In the [place]., one drink equals one 12 oz bottle of beer (355 mL), one 5 oz glass of wine (148 mL), or one 1 oz glass of hard liquor (44 mL). Stay at a healthy weight. If you need help losing weight, ask your doctor. General instructions  Follow instructions from your doctor about what you cannot eat or drink. Track your blood pressure at home. Tell your doctor about any changes. If you have diabetes, track your blood sugar. Exercise at least 30 minutes a day, 5 days a week. Keep your shots (vaccinations) up to date. Keep all follow-up visits. Where to find more information American Association of Kidney Patients: www.aakp.org National Kidney Foundation: www.kidney.org American Kidney Fund: www.akfinc.org Life Options: www.lifeoptions.org Kidney School: www.kidneyschool.org Contact a doctor if: Your symptoms get worse. You get new symptoms. Get help right away if: You get symptoms of end-stage kidney disease. These   include: Headaches. Losing feeling in your hands or feet. Easy bruising. Having hiccups often. Chest pain. Shortness of breath. Lack of menstrual periods, in women. You have a fever. You make less pee than normal. You have pain or you bleed when you pee or poop. These symptoms may be an  emergency. Get help right away. Call your local emergency services (911 in the [place] Do not wait to see if the symptoms will go away. Do not drive yourself to the hospital. Summary Chronic kidney disease is when lasting damage happens to the kidneys slowly over a long time. Causes of this disease include diabetes and high blood pressure. Often, there is no cure for this disease. Treatment can help symptoms and help keep the disease from getting worse. Treatment may involve lifestyle changes, medicines, and dialysis. This information is not intended to replace advice given to you by your health care provider. Make sure you discuss any questions you have with your health care provider. Document Revised: 02/28/2020 Document Reviewed: 02/28/2020 Elsevier Patient Education  2022 Elsevier Inc.  

## 2022-02-09 NOTE — Assessment & Plan Note (Signed)
Well-controlled hypertension with normal blood pressure readings at home.  Continue olmesartan 40 mg daily. ?Dietary approaches to stop hypertension discussed. ?Well-controlled diabetes on Trulicity, Farxiga, and insulin glargine. ?Lab Results  ?Component Value Date  ? HGBA1C 6.1 (A) 01/06/2022  ?Diet and nutrition discussed. ?Follow-up in 3 months. ? ?

## 2022-02-09 NOTE — Assessment & Plan Note (Signed)
Last GFR 23.  Has been on Farxiga for a while.  We will continue. ?Diet and nutrition discussed. ?Advised to stay well-hydrated and avoid NSAIDs. ?Continue follow-ups with nephrologist and possible kidney transplant. ?

## 2022-02-09 NOTE — Progress Notes (Signed)
Nancy [name] 66 y.o.   Chief Complaint  Patient presents with   New Patient (Initial Visit)    HISTORY OF PRESENT ILLNESS: This is a 66 y.o. female first visit to this office, here to establish care with me. Patient has history of hypertension and diabetes. Chronic kidney disease stage IV.  Being considered for kidney transplant. Dr. Royce Macadamia is the kidney doctor when she sees her on a regular basis. No other complaints or medical concerns today.  HPI   Prior to Admission medications   Medication Sig Start Date End Date Taking? Authorizing Provider  ACCU-CHEK FASTCLIX LANCETS MISC Use to check blood sugar up to 3 times a day 10/11/18  Yes Ledell Noss, MD  acetaminophen (TYLENOL) 500 MG tablet Take 2 tablets (1,000 mg total) by mouth every 8 (eight) hours as needed. 12/10/21 12/10/22 Yes Delene Ruffini, MD  atorvastatin (LIPITOR) 40 MG tablet Take 1 tablet (40 mg total) by mouth daily. 03/27/21  Yes Marianna Payment, MD  Blood Glucose Monitoring Suppl (ACCU-CHEK GUIDE) w/Device KIT 1 each by Does not apply route 3 (three) times daily. Check blood sugar 3 times a day 07/08/20  Yes Virl Axe, MD  Cholecalciferol (VITAMIN D3) 25 MCG (1000 UT) CAPS Take 1 capsule by mouth daily.   Yes [provider]  cyclobenzaprine (FLEXERIL) 10 MG tablet TAKE 1 TABLET BY MOUTH TWICE A DAY AS NEEDED FOR MUSCLE SPASM 12/26/21  Yes Virl Axe, MD  diclofenac Sodium (VOLTAREN) 1 % GEL Apply 2 g topically 4 (four) times daily. 11/25/20  Yes Maudie Mercury, MD  diclofenac Sodium (VOLTAREN) 1 % GEL Apply 2 g topically 4 (four) times daily. 01/06/22  Yes Delene Ruffini, MD  DILT-XR 240 MG 24 hr capsule Take 240 mg by mouth daily. 03/17/21  Yes [provider]  Dulaglutide (TRULICITY) 1.5 YQ/0.3KV SOPN Inject 1.5 mg into the skin once a week. 06/05/21  Yes Iona Beard, MD  FARXIGA 10 MG TABS tablet TAKE 1 TABLET BY MOUTH EVERY DAY BEFORE BREAKFAST 01/23/22  Yes Axel Filler, MD   glucose blood (ACCU-CHEK GUIDE) test strip Check blood sugar 3 times a day 10/22/21  Yes Virl Axe, MD  insulin glargine (LANTUS) 100 UNIT/ML injection INJECT 32 UNITS INTO THE SKIN EVERY MORNING WHEN YOU WAKE UP 12/26/21  Yes Virl Axe, MD  Insulin Syringe-Needle U-100 31G X 15/64" 0.5 ML MISC Use to inject insulin one time a day 07/08/20  Yes Virl Axe, MD  Insulin Syringes, Disposable, U-100 1 ML MISC 2 Syringes by Does not apply route daily. Use to inject insulin into the skin 1 time daily. diag code E11.9. Insulin dependent 07/08/20  Yes Virl Axe, MD  lidocaine (LIDODERM) 5 % PLACE 1 PATCH ONTO THE SKIN EVERY 12 (TWELVE) HOURS. REMOVE & DISCARD PATCH WITHIN 12 HOURS OR AS DIRECTED BY MD 12/26/21 12/26/22 Yes Virl Axe, MD  olmesartan (BENICAR) 40 MG tablet TAKE 1 TABLET BY MOUTH EVERY DAY 10/06/21  Yes Virl Axe, MD  omeprazole (PRILOSEC) 40 MG capsule TAKE 1 CAPSULE BY MOUTH EVERY DAY 07/28/21  Yes Virl Axe, MD  senna (SENOKOT) 8.6 MG tablet Take 1 tablet (8.6 mg total) by mouth as needed for constipation. 07/08/20  Yes Virl Axe, MD    Allergies  Allergen Reactions   Ace Inhibitors Cough    Persistent dry cough   Amitriptyline Hcl Nausea And Vomiting   Aspirin Other (See Comments)   Latex Itching   Prednisone Nausea And Vomiting    Patient Active  Problem List   Diagnosis Date Noted   Hyperlipidemia associated with type 2 diabetes mellitus ([place]) 04/20/2019   Vaginal atrophy 07/22/2018   Morbid obesity ([place]) 08/04/2017   CKD stage 3 due to type 2 diabetes mellitus ([place]) 08/02/2017   GERD (gastroesophageal reflux disease) 01/16/2014   Carpal tunnel syndrome, bilateral    Hypertension associated with diabetes ([place]) 11/09/2006   Controlled diabetes mellitus with retinopathy ([place]) 12/07/2002    Past Medical History:  Diagnosis Date   Carpal tunnel syndrome, bilateral    confirmed on nerve conduction studies   Chronic hepatitis C  ([place])    considered cured 06/2016 post retroviral therapy    Diabetes mellitus type 2, uncontrolled    Diabetic peripheral neuropathy (HCC)    GERD (gastroesophageal reflux disease)    History of endometriosis    History of Helicobacter infection 11/07   Hyperlipidemia    Liver fibrosis 02/17/2016   F2/3     Past Surgical History:  Procedure Laterality Date   ABDOMINAL HYSTERECTOMY     CARPAL TUNNEL RELEASE      Social History   Socioeconomic History   Marital status: Single    Spouse name: Not on file   Number of children: 0   Years of education: 12   Highest education level: Not on file  Occupational History   Occupation: Forensic psychologist: [employer]   Occupation: Disabled  Tobacco Use   Smoking status: Never   Smokeless tobacco: Never  Vaping Use   Vaping Use: Never used  Substance and Sexual Activity   Alcohol use: No    Alcohol/week: 0.0 standard drinks   Drug use: No   Sexual activity: Yes    Birth control/protection: None    Comment: 1 committed partner  Other Topics Concern   Not on file  Social History Narrative   Current Social History 08/28/2019        Patient lives alone in a 7th floor apartment with elevator. There are not steps up to the entrance the patient uses.       Patient's method of transportation is via family member (mom or friend).      The highest level of education was high school diploma.      The patient currently disabled.      Identified important Relationships are "My mother"       Pets : None       Interests / Fun: "TV, movies"       Current Stressors: "People, my health"       Religious / Personal Beliefs: "Baptist"       L. [place], RN, BSN       Social Determinants of Health   Financial Resource Strain: Not on file  Food Insecurity: Not on file  Transportation Needs: Not on file  Physical Activity: Not on file  Stress: Not on file  Social Connections: Not on file  Intimate Partner  Violence: Not on file    Family History  Problem Relation Age of Onset   Cancer Mother        lung   Kidney disease Mother    Lung cancer Maternal Uncle    Breast cancer Maternal Grandmother    Liver cancer Maternal Grandfather    Heart disease Brother    Depression Brother    Diabetes Father    Dementia Father    Colon cancer Neg Hx      Review of Systems  Constitutional: Negative.  Negative  for chills and fever.  HENT: Negative.  Negative for congestion and sore throat.   Respiratory: Negative.  Negative for cough and shortness of breath.   Cardiovascular: Negative.  Negative for chest pain and palpitations.  Gastrointestinal: Negative.  Negative for abdominal pain, diarrhea, nausea and vomiting.  Skin: Negative.  Negative for rash.  Neurological:  Negative for dizziness and headaches.  All other systems reviewed and are negative.  Today's Vitals   02/09/22 0951 02/09/22 1016  BP: (!) 158/80 130/70  Pulse: 71   SpO2: 98%   Weight: 159 lb (72.1 kg)   Height: _0  (1.448 m)    Body mass index is 34.41 kg/m.  Physical Exam Vitals reviewed.  Constitutional:      Appearance: Normal appearance.  HENT:     Head: Normocephalic.     Mouth/Throat:     Mouth: Mucous membranes are moist.     Pharynx: Oropharynx is clear.  Eyes:     Extraocular Movements: Extraocular movements intact.     Pupils: Pupils are equal, round, and reactive to light.  Cardiovascular:     Rate and Rhythm: Normal rate and regular rhythm.     Pulses: Normal pulses.     Heart sounds: Normal heart sounds.  Pulmonary:     Effort: Pulmonary effort is normal.     Breath sounds: Normal breath sounds.  Musculoskeletal:     Cervical back: No tenderness.  Lymphadenopathy:     Cervical: No cervical adenopathy.  Skin:    General: Skin is warm and dry.  Neurological:     General: No focal deficit present.     Mental Status: She is alert and oriented to person, place, and time.   Psychiatric:        Mood and Affect: Mood normal.        Behavior: Behavior normal.     ASSESSMENT & PLAN: A total of 51 minutes was spent with the patient and counseling/coordination of care regarding preparing for this visit, review of most recent office visit notes and medical records available to me, review of most recent blood work results, review of all medications, review of multiple chronic medical problems and their management, education on nutrition, prognosis, documentation, and need for follow-up.  Problem List Items Addressed This Visit       Cardiovascular and Mediastinum   Hypertension associated with diabetes ([place]) - Primary    Well-controlled hypertension with normal blood pressure readings at home.  Continue olmesartan 40 mg daily. Dietary approaches to stop hypertension discussed. Well-controlled diabetes on Trulicity, Farxiga, and insulin glargine. Lab Results  Component Value Date   HGBA1C 6.1 (A) 01/06/2022  Diet and nutrition discussed. Follow-up in 3 months.         Genitourinary   Stage 4 chronic kidney disease (HCC)    Last GFR 23.  Has been on Farxiga for a while.  We will continue. Diet and nutrition discussed. Advised to stay well-hydrated and avoid NSAIDs. Continue follow-ups with nephrologist and possible kidney transplant.      Other Visit Diagnoses     Encounter to establish care          Patient Instructions  Chronic Kidney Disease, Adult Chronic kidney disease is when lasting damage happens to the kidneys slowly over a long time. The kidneys help to: Make pee (urine). Make hormones. Keep the right amount of fluids and chemicals in the body. Most often, this disease does not go away. You must take steps to help  keep the kidney damage from getting worse. If steps are not taken, the kidneys might stop working forever. What are the causes? Diabetes. High blood pressure. Diseases that affect the heart and blood vessels. Other kidney  diseases. Diseases of the body's disease-fighting system. A problem with the flow of pee. Infections of the organs that make pee, store it, and take it out of the body. Swelling or irritation of your blood vessels. What increases the risk? Getting older. Having someone in your family who has kidney disease or kidney failure. Having a disease caused by genes. Taking medicines often that harm the kidneys. Being near or having contact with harmful substances. Being very overweight. Using tobacco now or in the past. What are the signs or symptoms? Feeling very tired. Having a swollen face, legs, ankles, or feet. Feeling like you may vomit or vomiting. Not feeling hungry. Being confused or not able to focus. Twitches and cramps in the leg muscles or other muscles. Dry, itchy skin. A taste of metal in your mouth. Making less pee, or making more pee. Shortness of breath. Trouble sleeping. You may also become anemic or get weak bones. Anemic means there is not enough red blood cells or hemoglobin in your blood. You may get symptoms slowly. You may not notice them until the kidney damage gets very bad. How is this treated? Often, there is no cure for this disease. Treatment can help with symptoms and help keep the disease from getting worse. You may need to: Avoid alcohol. Avoid foods that are high in salt, potassium, phosphorous, and protein. Take medicines for symptoms and to help control other conditions. Have dialysis. This treatment gets harmful waste out of your body. Treat other problems that cause your kidney disease or make it worse. Follow these instructions at home: Medicines Take over-the-counter and prescription medicines only as told by your doctor. Do not take any new medicines, vitamins, or supplements unless your doctor says it is okay. Lifestyle  Do not smoke or use any products that contain nicotine or tobacco. If you need help quitting, ask your doctor. If you  drink alcohol: Limit how much you use to: 0-1 drink a day for women who are not pregnant. 0-2 drinks a day for men. Know how much alcohol is in your drink. In the [place]., one drink equals one 12 oz bottle of beer (355 mL), one 5 oz glass of wine (148 mL), or one 1 oz glass of hard liquor (44 mL). Stay at a healthy weight. If you need help losing weight, ask your doctor. General instructions  Follow instructions from your doctor about what you cannot eat or drink. Track your blood pressure at home. Tell your doctor about any changes. If you have diabetes, track your blood sugar. Exercise at least 30 minutes a day, 5 days a week. Keep your shots (vaccinations) up to date. Keep all follow-up visits. Where to find more information American Association of Kidney Patients: BombTimer.gl National Kidney Foundation: www.kidney.[place]: https://mathis.com/ Life Options: www.lifeoptions.org Kidney School: www.kidneyschool.org Contact a doctor if: Your symptoms get worse. You get new symptoms. Get help right away if: You get symptoms of end-stage kidney disease. These include: Headaches. Losing feeling in your hands or feet. Easy bruising. Having hiccups often. Chest pain. Shortness of breath. Lack of menstrual periods, in women. You have a fever. You make less pee than normal. You have pain or you bleed when you pee or poop. These symptoms may be an emergency. Get  help right away. Call your local emergency services (911 in the [place] Do not wait to see if the symptoms will go away. Do not drive yourself to the hospital. Summary Chronic kidney disease is when lasting damage happens to the kidneys slowly over a long time. Causes of this disease include diabetes and high blood pressure. Often, there is no cure for this disease. Treatment can help symptoms and help keep the disease from getting worse. Treatment may involve lifestyle changes, medicines, and dialysis. This  information is not intended to replace advice given to you by your health care provider. Make sure you discuss any questions you have with your health care provider. Document Revised: 02/28/2020 Document Reviewed: 02/28/2020 Elsevier Patient Education  2022 [place], MD [facility]

## 2022-02-10 ENCOUNTER — Other Ambulatory Visit: Payer: Self-pay | Admitting: Student

## 2022-02-10 ENCOUNTER — Other Ambulatory Visit: Payer: Self-pay | Admitting: Internal Medicine

## 2022-02-10 DIAGNOSIS — E1159 Type 2 diabetes mellitus with other circulatory complications: Secondary | ICD-10-CM

## 2022-02-10 DIAGNOSIS — I152 Hypertension secondary to endocrine disorders: Secondary | ICD-10-CM

## 2022-03-04 ENCOUNTER — Telehealth: Payer: Self-pay
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
## 2022-03-05 NOTE — Telephone Encounter (Signed)
Called patient to inform her of provider recommendation. Patient verbalize understanding  ?

## 2022-03-06 NOTE — Telephone Encounter (Signed)
Patient called and was informed of provider recommendation. Patient states she called her previous provider that referred her to PT and was told since she is not longer a patient of theirs that a referral had to come from her current provider. I advised the patient again that her current provider was unable to submit referral since we have not seen her for this issue. Patient states she wanted to ask the provider if she can come in for a visit regarding this issue. Please advise  ?

## 2022-03-09 NOTE — Telephone Encounter (Signed)
Why is she no longer a patient of the group that initially took care of this problem and referred her to physical therapy?  I do not understand.  Inquire further please.  Thanks.

## 2022-03-10 NOTE — Telephone Encounter (Signed)
Needs office visit.  Thanks.

## 2022-03-10 NOTE — Telephone Encounter (Signed)
Appt scheduled

## 2022-03-23 ENCOUNTER — Encounter: Payer: Self-pay | Admitting: Emergency Medicine

## 2022-03-23 ENCOUNTER — Ambulatory Visit (INDEPENDENT_AMBULATORY_CARE_PROVIDER_SITE_OTHER): Payer: Medicare Other | Admitting: Emergency Medicine

## 2022-03-23 VITALS — BP 130/72 | HR 63 | Temp 98.5°F | Ht <= 58 in | Wt 157.0 lb

## 2022-03-23 DIAGNOSIS — S4992XS Unspecified injury of left shoulder and upper arm, sequela: Secondary | ICD-10-CM | POA: Diagnosis not present
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
## 2022-03-23 NOTE — Patient Instructions (Signed)
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
## 2022-04-03 ENCOUNTER — Ambulatory Visit: Payer: Medicare Other | Attending: Emergency Medicine

## 2022-04-03 DIAGNOSIS — M79602 Pain in left arm: Secondary | ICD-10-CM | POA: Insufficient documentation

## 2022-04-03 DIAGNOSIS — S4992XS Unspecified injury of left shoulder and upper arm, sequela: Secondary | ICD-10-CM | POA: Diagnosis not present

## 2022-04-03 DIAGNOSIS — M6281 Muscle weakness (generalized): Secondary | ICD-10-CM | POA: Diagnosis not present

## 2022-04-05 ENCOUNTER — Other Ambulatory Visit: Payer: Self-pay | Admitting: Student
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
## 2022-04-15 ENCOUNTER — Ambulatory Visit (INDEPENDENT_AMBULATORY_CARE_PROVIDER_SITE_OTHER): Payer: Medicare Other | Admitting: Orthopedic Surgery

## 2022-04-15 ENCOUNTER — Encounter: Payer: Self-pay | Admitting: Orthopedic Surgery

## 2022-04-15 DIAGNOSIS — M5412 Radiculopathy, cervical region: Secondary | ICD-10-CM

## 2022-04-15 DIAGNOSIS — M79602 Pain in left arm: Secondary | ICD-10-CM | POA: Diagnosis not present
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
## 2022-04-16 ENCOUNTER — Ambulatory Visit: Payer: Medicare Other

## 2022-04-16 NOTE — Addendum Note (Signed)
Addended byLaurann Montana on: 04/16/2022 02:48 PM ? ? Modules accepted: Orders ? ?

## 2022-04-20 ENCOUNTER — Telehealth: Payer: Self-pay | Admitting: Emergency Medicine

## 2022-04-20 ENCOUNTER — Other Ambulatory Visit: Payer: Self-pay | Admitting: Emergency Medicine

## 2022-04-20 MED ORDER — LORAZEPAM 1 MG PO TABS: ORAL_TABLET | ORAL | 0 refills | Status: DC

## 2022-04-20 NOTE — Telephone Encounter (Signed)
Pt called in and requesting a sedative before she goes in to complete her MRI.  ? ?States that she is claustrophobic.  ?

## 2022-04-20 NOTE — Telephone Encounter (Signed)
Called patient to inform her that her rx for Ativan was sent to pharmacy on file  ?

## 2022-04-20 NOTE — Telephone Encounter (Signed)
Prescription for Ativan sent to pharmacy of record today.  Thanks.

## 2022-04-21 ENCOUNTER — Ambulatory Visit: Payer: Medicare Other | Attending: Emergency Medicine

## 2022-04-21 DIAGNOSIS — M79602 Pain in left arm: Secondary | ICD-10-CM | POA: Insufficient documentation

## 2022-04-21 DIAGNOSIS — M6281 Muscle weakness (generalized): Secondary | ICD-10-CM | POA: Insufficient documentation

## 2022-04-21 DIAGNOSIS — N184 Chronic kidney disease, stage 4 (severe): Secondary | ICD-10-CM | POA: Diagnosis not present

## 2022-04-22 ENCOUNTER — Other Ambulatory Visit: Payer: Self-pay | Admitting: Student
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
## 2022-04-23 ENCOUNTER — Ambulatory Visit: Payer: Medicare Other

## 2022-04-23 DIAGNOSIS — M6281 Muscle weakness (generalized): Secondary | ICD-10-CM

## 2022-04-23 DIAGNOSIS — M79602 Pain in left arm: Secondary | ICD-10-CM

## 2022-04-24 ENCOUNTER — Ambulatory Visit (INDEPENDENT_AMBULATORY_CARE_PROVIDER_SITE_OTHER): Payer: Medicare Other

## 2022-04-24 DIAGNOSIS — Z0000 Encounter for general adult medical examination without abnormal findings: Secondary | ICD-10-CM

## 2022-04-24 NOTE — Progress Notes (Signed)
Subjective:   Nancy [name] is a 66 y.o. female who presents for Medicare Annual (Subsequent) preventive examination.   I connected with Drucie Opitz today by telephone and verified that I am speaking with the correct person using two identifiers. Location patient: home Location provider: work Persons participating in the virtual visit: patient, provider.   I discussed the limitations, risks, security and privacy concerns of performing an evaluation and management service by telephone and the availability of in person appointments. I also discussed with the patient that there may be a patient responsible charge related to this service. The patient expressed understanding and verbally consented to this telephonic visit.    Interactive audio and video telecommunications were attempted between this provider and patient, however failed, due to patient having technical difficulties OR patient did not have access to video capability.  We continued and completed visit with audio only.    Review of Systems     Cardiac Risk Factors include: advanced age (>45mn, >>75women)     Objective:    Today's Vitals   There is no height or weight on file to calculate BMI.     04/24/2022    2:49 PM 04/03/2022   12:58 PM 01/06/2022   10:11 AM 01/05/2022    1:34 PM 12/10/2021    2:24 PM 09/11/2021    1:23 PM 08/21/2021   10:53 AM  Advanced Directives  Does Patient Have a Medical Advance Directive? _0  No No  Would patient like information on creating a medical advance directive? No - Patient declined No - Patient declined No - Patient declined No - Patient declined No - Patient declined No - Patient declined No - Patient declined    Current Medications (verified) Outpatient Encounter Medications as of 04/24/2022  Medication Sig   ACCU-CHEK FASTCLIX LANCETS MISC Use to check blood sugar up to 3 times a day   acetaminophen (TYLENOL) 500 MG tablet Take 2 tablets (1,000 mg total) by mouth  every 8 (eight) hours as needed.   atorvastatin (LIPITOR) 40 MG tablet TAKE 1 TABLET BY MOUTH EVERY DAY   Blood Glucose Monitoring Suppl (ACCU-CHEK GUIDE) w/Device KIT 1 each by Does not apply route 3 (three) times daily. Check blood sugar 3 times a day   Cholecalciferol (VITAMIN D3) 25 MCG (1000 UT) CAPS Take 1 capsule by mouth daily.   cyclobenzaprine (FLEXERIL) 10 MG tablet TAKE 1 TABLET BY MOUTH TWICE A DAY AS NEEDED FOR MUSCLE SPASMS   diclofenac Sodium (VOLTAREN) 1 % GEL Apply 2 g topically 4 (four) times daily.   diclofenac Sodium (VOLTAREN) 1 % GEL Apply 2 g topically 4 (four) times daily.   DILT-XR 240 MG 24 hr capsule Take 240 mg by mouth daily.   Dulaglutide (TRULICITY) 1.5 MPX/1.0GYSOPN Inject 1.5 mg into the skin once a week.   FARXIGA 10 MG TABS tablet TAKE 1 TABLET BY MOUTH EVERY DAY BEFORE BREAKFAST   glucose blood (ACCU-CHEK GUIDE) test strip Check blood sugar 3 times a day   insulin glargine (LANTUS) 100 UNIT/ML injection INJECT 32 UNITS INTO THE SKIN EVERY MORNING WHEN YOU WAKE UP   Insulin Syringe-Needle U-100 31G X 15/64" 0.5 ML MISC Use to inject insulin one time a day   Insulin Syringes, Disposable, U-100 1 ML MISC 2 Syringes by Does not apply route daily. Use to inject insulin into the skin 1 time daily. diag code E11.9. Insulin dependent   lidocaine (LIDODERM) 5 % PLACE 1 PATCH  ONTO THE SKIN EVERY 12 (TWELVE) HOURS. REMOVE & DISCARD PATCH WITHIN 12 HOURS OR AS DIRECTED BY MD   LORazepam (ATIVAN) 1 MG tablet Sig 1 mg 1 hour before procedure as needed.   olmesartan (BENICAR) 40 MG tablet TAKE 1 TABLET BY MOUTH EVERY DAY   omeprazole (PRILOSEC) 40 MG capsule TAKE 1 CAPSULE BY MOUTH EVERY DAY   senna (SENOKOT) 8.6 MG tablet Take 1 tablet (8.6 mg total) by mouth as needed for constipation.   Facility-Administered Encounter Medications as of 04/24/2022  Medication   diclofenac Sodium (VOLTAREN) 1 % topical gel 4 g    Allergies (verified) Ace inhibitors, Amitriptyline hcl,  Aspirin, Latex, and Prednisone   History: Past Medical History:  Diagnosis Date   Carpal tunnel syndrome, bilateral    confirmed on nerve conduction studies   Chronic hepatitis C ([place])    considered cured 06/2016 post retroviral therapy    Diabetes mellitus type 2, uncontrolled    Diabetic peripheral neuropathy (HCC)    GERD (gastroesophageal reflux disease)    History of endometriosis    History of Helicobacter infection 11/07   Hyperlipidemia    Liver fibrosis 02/17/2016   F2/3    Past Surgical History:  Procedure Laterality Date   ABDOMINAL HYSTERECTOMY     CARPAL TUNNEL RELEASE     Family History  Problem Relation Age of Onset   Cancer Mother        lung   Kidney disease Mother    Lung cancer Maternal Uncle    Breast cancer Maternal Grandmother    Liver cancer Maternal Grandfather    Heart disease Brother    Depression Brother    Diabetes Father    Dementia Father    Colon cancer Neg Hx    Social History   Socioeconomic History   Marital status: Single    Spouse name: Not on file   Number of children: 0   Years of education: 12   Highest education level: Not on file  Occupational History   Occupation: Forensic psychologist: [employer]   Occupation: Disabled  Tobacco Use   Smoking status: Never   Smokeless tobacco: Never  Vaping Use   Vaping Use: Never used  Substance and Sexual Activity   Alcohol use: No    Alcohol/week: 0.0 standard drinks   Drug use: No   Sexual activity: Yes    Birth control/protection: None    Comment: 1 committed partner  Other Topics Concern   Not on file  Social History Narrative   Current Social History 08/28/2019        Patient lives alone in a 7th floor apartment with elevator. There are not steps up to the entrance the patient uses.       Patient's method of transportation is via family member (mom or friend).      The highest level of education was high school diploma.      The patient currently disabled.       Identified important Relationships are "My mother"       Pets : None       Interests / Fun: "TV, movies"       Current Stressors: "People, my health"       Religious / Personal Beliefs: "Baptist"       L. Ducatte, RN, BSN       Social Determinants of Health   Financial Resource Strain: Low Risk    Difficulty of Paying Living Expenses: Not  hard at all  Food Insecurity: No Food Insecurity   Worried About Charity fundraiser in the Last Year: Never true   Ran Out of Food in the Last Year: Never true  Transportation Needs: No Transportation Needs   Lack of Transportation (Medical): No   Lack of Transportation (Non-Medical): No  Physical Activity: Not on file  Stress: No Stress Concern Present   Feeling of Stress : Not at all  Social Connections: Socially Isolated   Frequency of Communication with Friends and Family: Three times a week   Frequency of Social Gatherings with Friends and Family: Three times a week   Attends Religious Services: Never   Active Member of Clubs or Organizations: No   Attends Music therapist: Never   Marital Status: Divorced    Tobacco Counseling Counseling given: Not Answered   Clinical Intake:  Pre-visit preparation completed: Yes  Pain : No/denies pain     Nutritional Risks: None Diabetes: Yes CBG done?: No Did pt. bring in CBG monitor from home?: No  How often do you need to have someone help you when you read instructions, pamphlets, or other written materials from your doctor or pharmacy?: 1 - Never What is the last grade level you completed in school?: 11 th grade  Diabetic?yes Nutrition Risk Assessment:  Has the patient had any N/V/D within the last 2 months?  No  Does the patient have any non-healing wounds?  No  Has the patient had any unintentional weight loss or weight gain?  No   Diabetes:  Is the patient diabetic?  Yes  If diabetic, was a CBG obtained today?  No  Did the patient bring in their  glucometer from home?  No  How often do you monitor your CBG's? 3 x day .   Financial Strains and Diabetes Management:  Are you having any financial strains with the device, your supplies or your medication? No .  Does the patient want to be seen by Chronic Care Management for management of their diabetes?  No  Would the patient like to be referred to a Nutritionist or for Diabetic Management?  No   Diabetic Exams:  Diabetic Eye Exam: Completed 04/2022 Diabetic Foot Exam: Overdue, Pt has been advised about the importance in completing this exam. Pt is scheduled for diabetic foot exam on next office visit .   Interpreter Needed?: No  Information entered by :: L.Cledith Abdou,LPN   Activities of Daily Living    04/24/2022    2:50 PM 01/06/2022   10:10 AM  In your present state of health, do you have any difficulty performing the following activities:  Hearing? 0 0  Vision? 0 1  Difficulty concentrating or making decisions? 0 1  Walking or climbing stairs? 0 0  Dressing or bathing? 0 0  Doing errands, shopping? 0 0  Preparing Food and eating ? N   Using the Toilet? N   In the past six months, have you accidently leaked urine? N   Do you have problems with loss of bowel control? N   Managing your Medications? N   Managing your Finances? N   Housekeeping or managing your Housekeeping? N     Patient Care Team: Horald Pollen, MD as PCP - General (Internal Medicine) Plyler, Chauncey Reading, RD as Diabetes Educator ([place]) Camillo Flaming, [place] as Referring Physician (Optometry) Ivory Broad, DMD as Referring Physician (Dentistry) Milus Banister, MD as Attending Physician (Gastroenterology) Claudia Desanctis, MD as Consulting  Physician (Internal Medicine) Brunetta Genera, MD as Consulting Physician (Hematology)  Indicate any recent Medical Services you may have received from other than [facility] providers in the past year (date may be approximate).     Assessment:   This is a  routine wellness examination for Nancy [name].  Hearing/Vision screen Vision Screening - Comments:: Annual eye exams wear glasses   Dietary issues and exercise activities discussed: Current Exercise Habits: The patient does not participate in regular exercise at present, Exercise limited by: None identified   Goals Addressed   None    Depression Screen    04/24/2022    2:49 PM 04/24/2022    2:42 PM 03/23/2022    1:08 PM 02/09/2022    9:48 AM 01/06/2022   10:07 AM 12/10/2021    3:11 PM 09/11/2021    2:13 PM  PHQ 2/9 Scores  PHQ - 2 Score 0 0 0 0 _0 PHQ- 9 Score     _1 Fall Risk    04/24/2022    2:49 PM 03/23/2022    1:08 PM 02/09/2022    9:48 AM 01/06/2022   10:07 AM 12/10/2021    2:23 PM  Fall Risk   Falls in the past year? 0 0 0 0 0  Number falls in past yr: 0  0 0 0  Injury with Fall? 0  0 0 0  Risk for fall due to :    No Fall Risks History of fall(s);Impaired balance/gait  Follow up Falls evaluation completed   Falls evaluation completed;Falls prevention discussed Falls evaluation completed    FALL RISK PREVENTION PERTAINING TO THE HOME:  Any stairs in or around the home? No  If so, are there any without handrails? No  Home free of loose throw rugs in walkways, pet beds, electrical cords, etc? Yes  Adequate lighting in your home to reduce risk of falls? Yes   ASSISTIVE DEVICES UTILIZED TO PREVENT FALLS:  Life alert? No  Use of a cane, walker or w/c? No  Grab bars in the bathroom? No  Shower chair or bench in shower? No  Elevated toilet seat or a handicapped toilet? Yes     Cognitive Function:    Normal cognitive status assessed by telephone conversation  by this Nurse Health Advisor. No abnormalities found.      Immunizations Immunization History  Administered Date(s) Administered   Hepatitis A, Adult 10/02/2015, 06/18/2016   Hepatitis B, adult 10/02/2015, 02/17/2016, 06/18/2016   Influenza Whole 10/12/2007, 08/31/2008, 09/09/2009, 08/21/2010    Influenza,inj,Quad PF,6+ Mos 08/18/2013, 10/02/2014, 08/15/2015, 09/08/2016, 09/14/2017, 08/24/2018, 09/12/2019, 10/09/2021   Influenza-Unspecified 09/10/2011   PFIZER(Purple Top)SARS-COV-2 Vaccination 11/05/2020   PPD Test 05/17/2012, 05/09/2013   Pneumococcal Conjugate-13 03/22/2018   Pneumococcal Polysaccharide-23 07/08/2011, 10/10/2019   Tdap 07/08/2011, 01/03/2017   Zoster Recombinat (Shingrix) 10/31/2021    TDAP status: Up to date  Flu Vaccine status: Up to date  Pneumococcal vaccine status: Up to date  Covid-19 vaccine status: Completed vaccines  Qualifies for Shingles Vaccine? Yes   Zostavax completed Yes   Shingrix Completed?: Yes  Screening Tests Health Maintenance  Topic Date Due   COVID-19 Vaccine (2 - Pfizer risk series) 11/26/2020   Zoster Vaccines- Shingrix (2 of 2) 12/26/2021   FOOT EXAM  03/24/2022   HEMOGLOBIN A1C  04/05/2022   MAMMOGRAM  07/03/2022   INFLUENZA VACCINE  07/07/2022   OPHTHALMOLOGY EXAM  07/15/2022   COLONOSCOPY (Pts 45-80yr Insurance coverage will need to be  confirmed)  04/02/2024   TETANUS/TDAP  01/03/2027   Hepatitis C Screening  Completed   HIV Screening  Completed   HPV VACCINES  Aged Out    Health Maintenance  Health Maintenance Due  Topic Date Due   COVID-19 Vaccine (2 - Pfizer risk series) 11/26/2020   Zoster Vaccines- Shingrix (2 of 2) 12/26/2021   FOOT EXAM  03/24/2022   HEMOGLOBIN A1C  04/05/2022    Colorectal cancer screening: Type of screening: Colonoscopy. Completed 04/02/2014. Repeat every 10 years  Mammogram status: Completed 07/03/2021. Repeat every year  Bone Density status: Ordered not of age . Pt provided with contact info and advised to call to schedule appt.  Lung Cancer Screening: (Low Dose CT Chest recommended if Age 2-80 years, 30 pack-year currently smoking OR have quit w/in 15years.) does not qualify.   Lung Cancer Screening Referral: n/a  Additional Screening:  Hepatitis C Screening: does not  qualify; Completed 12/20/2017  Vision Screening: Recommended annual ophthalmology exams for early detection of glaucoma and other disorders of the eye. Is the patient up to date with their annual eye exam?  Yes  Who is the provider or what is the name of the office in which the patient attends annual eye exams? Dr.Koontz  If pt is not established with a provider, would they like to be referred to a provider to establish care? No .   Dental Screening: Recommended annual dental exams for proper oral hygiene  Community Resource Referral / Chronic Care Management: CRR required this visit?  No   CCM required this visit?  No      Plan:     I have personally reviewed and noted the following in the patient's chart:   Medical and social history Use of alcohol, tobacco or illicit drugs  Current medications and supplements including opioid prescriptions.  Functional ability and status Nutritional status Physical activity Advanced directives List of other physicians Hospitalizations, surgeries, and ER visits in previous 12 months Vitals Screenings to include cognitive, depression, and falls Referrals and appointments  In addition, I have reviewed and discussed with patient certain preventive protocols, quality metrics, and best practice recommendations. A written personalized care plan for preventive services as well as general preventive health recommendations were provided to patient.     Randel Pigg, LPN   2/30/0979   Nurse Notes: none

## 2022-04-24 NOTE — Patient Instructions (Signed)
[name] , Thank you for taking time to come for your Medicare Wellness Visit. I appreciate your ongoing commitment to your health goals. Please review the following plan we discussed and let me know if I can assist you in the future.   Screening recommendations/referrals: Colonoscopy: 04/02/2014 Mammogram: 07/03/2021 Bone Density: not of age  Recommended yearly ophthalmology/optometry visit for glaucoma screening and checkup Recommended yearly dental visit for hygiene and checkup  Vaccinations: Influenza vaccine: completed  Pneumococcal vaccine: completed  Tdap vaccine: 01/03/2017 Shingles vaccine: completed     Advanced directives: none   Conditions/risks identified: none   Next appointment: none    Preventive Care 65 Years and Older, Female Preventive care refers to lifestyle choices and visits with your health care provider that can promote health and wellness. What does preventive care include? A yearly physical exam. This is also called an annual well check. Dental exams once or twice a year. Routine eye exams. Ask your health care provider how often you should have your eyes checked. Personal lifestyle choices, including: Daily care of your teeth and gums. Regular physical activity. Eating a healthy diet. Avoiding tobacco and drug use. Limiting alcohol use. Practicing safe sex. Taking low-dose aspirin every day. Taking vitamin and mineral supplements as recommended by your health care provider. What happens during an annual well check? The services and screenings done by your health care provider during your annual well check will depend on your age, overall health, lifestyle risk factors, and family history of disease. Counseling  Your health care provider may ask you questions about your: Alcohol use. Tobacco use. Drug use. Emotional well-being. Home and relationship well-being. Sexual activity. Eating habits. History of falls. Memory and ability to  understand (cognition). Work and work Statistician. Reproductive health. Screening  You may have the following tests or measurements: Height, weight, and BMI. Blood pressure. Lipid and cholesterol levels. These may be checked every 5 years, or more frequently if you are over 65 years old. Skin check. Lung cancer screening. You may have this screening every year starting at age 65 if you have a 30-pack-year history of smoking and currently smoke or have quit within the past 15 years. Fecal occult blood test (FOBT) of the stool. You may have this test every year starting at age 65. Flexible sigmoidoscopy or colonoscopy. You may have a sigmoidoscopy every 5 years or a colonoscopy every 10 years starting at age 65. Hepatitis C blood test. Hepatitis B blood test. Sexually transmitted disease (STD) testing. Diabetes screening. This is done by checking your blood sugar (glucose) after you have not eaten for a while (fasting). You may have this done every 1-3 years. Bone density scan. This is done to screen for osteoporosis. You may have this done starting at age 65. Mammogram. This may be done every 1-2 years. Talk to your health care provider about how often you should have regular mammograms. Talk with your health care provider about your test results, treatment options, and if necessary, the need for more tests. Vaccines  Your health care provider may recommend certain vaccines, such as: Influenza vaccine. This is recommended every year. Tetanus, diphtheria, and acellular pertussis (Tdap, Td) vaccine. You may need a Td booster every 10 years. Zoster vaccine. You may need this after age 25. Pneumococcal 13-valent conjugate (PCV13) vaccine. One dose is recommended after age 65. Pneumococcal polysaccharide (PPSV23) vaccine. One dose is recommended after age 65. Talk to your health care provider about which screenings and vaccines you need and  how often you need them. This information is not  intended to replace advice given to you by your health care provider. Make sure you discuss any questions you have with your health care provider. Document Released: 12/20/2015 Document Revised: 08/12/2016 Document Reviewed: 09/24/2015 Elsevier Interactive Patient Education  2017 [place] Prevention in the Home Falls can cause injuries. They can happen to people of all ages. There are many things you can do to make your home safe and to help prevent falls. What can I do on the outside of my home? Regularly fix the edges of walkways and driveways and fix any cracks. Remove anything that might make you trip as you walk through a door, such as a raised step or threshold. Trim any bushes or trees on the path to your home. Use bright outdoor lighting. Clear any walking paths of anything that might make someone trip, such as rocks or tools. Regularly check to see if handrails are loose or broken. Make sure that both sides of any steps have handrails. Any raised decks and porches should have guardrails on the edges. Have any leaves, snow, or ice cleared regularly. Use sand or salt on walking paths during winter. Clean up any spills in your garage right away. This includes oil or grease spills. What can I do in the bathroom? Use night lights. Install grab bars by the toilet and in the tub and shower. Do not use towel bars as grab bars. Use non-skid mats or decals in the tub or shower. If you need to sit down in the shower, use a plastic, non-slip stool. Keep the floor dry. Clean up any water that spills on the floor as soon as it happens. Remove soap buildup in the tub or shower regularly. Attach bath mats securely with double-sided non-slip rug tape. Do not have throw rugs and other things on the floor that can make you trip. What can I do in the bedroom? Use night lights. Make sure that you have a light by your bed that is easy to reach. Do not use any sheets or blankets that are  too big for your bed. They should not hang down onto the floor. Have a firm chair that has side arms. You can use this for support while you get dressed. Do not have throw rugs and other things on the floor that can make you trip. What can I do in the kitchen? Clean up any spills right away. Avoid walking on wet floors. Keep items that you use a lot in easy-to-reach places. If you need to reach something above you, use a strong step stool that has a grab bar. Keep electrical cords out of the way. Do not use floor polish or wax that makes floors slippery. If you must use wax, use non-skid floor wax. Do not have throw rugs and other things on the floor that can make you trip. What can I do with my stairs? Do not leave any items on the stairs. Make sure that there are handrails on both sides of the stairs and use them. Fix handrails that are broken or loose. Make sure that handrails are as long as the stairways. Check any carpeting to make sure that it is firmly attached to the stairs. Fix any carpet that is loose or worn. Avoid having throw rugs at the top or bottom of the stairs. If you do have throw rugs, attach them to the floor with carpet tape. Make sure that you have  a light switch at the top of the stairs and the bottom of the stairs. If you do not have them, ask someone to add them for you. What else can I do to help prevent falls? Wear shoes that: Do not have high heels. Have rubber bottoms. Are comfortable and fit you well. Are closed at the toe. Do not wear sandals. If you use a stepladder: Make sure that it is fully opened. Do not climb a closed stepladder. Make sure that both sides of the stepladder are locked into place. Ask someone to hold it for you, if possible. Clearly mark and make sure that you can see: Any grab bars or handrails. First and last steps. Where the edge of each step is. Use tools that help you move around (mobility aids) if they are needed. These  include: Canes. Walkers. Scooters. Crutches. Turn on the lights when you go into a dark area. Replace any light bulbs as soon as they burn out. Set up your furniture so you have a clear path. Avoid moving your furniture around. If any of your floors are uneven, fix them. If there are any pets around you, be aware of where they are. Review your medicines with your doctor. Some medicines can make you feel dizzy. This can increase your chance of falling. Ask your doctor what other things that you can do to help prevent falls. This information is not intended to replace advice given to you by your health care provider. Make sure you discuss any questions you have with your health care provider. Document Released: 09/19/2009 Document Revised: 04/30/2016 Document Reviewed: 12/28/2014 Elsevier Interactive Patient Education  2017 [employer].

## 2022-04-27 DIAGNOSIS — N2581 Secondary hyperparathyroidism of renal origin: Secondary | ICD-10-CM | POA: Diagnosis not present

## 2022-04-27 DIAGNOSIS — R768 Other specified abnormal immunological findings in serum: Secondary | ICD-10-CM | POA: Diagnosis not present

## 2022-04-27 DIAGNOSIS — I129 Hypertensive chronic kidney disease with stage 1 through stage 4 chronic kidney disease, or unspecified chronic kidney disease: Secondary | ICD-10-CM | POA: Diagnosis not present

## 2022-04-27 DIAGNOSIS — R801 Persistent proteinuria, unspecified: Secondary | ICD-10-CM | POA: Diagnosis not present

## 2022-04-27 DIAGNOSIS — E1122 Type 2 diabetes mellitus with diabetic chronic kidney disease: Secondary | ICD-10-CM | POA: Diagnosis not present

## 2022-04-27 DIAGNOSIS — N184 Chronic kidney disease, stage 4 (severe): Secondary | ICD-10-CM | POA: Diagnosis not present

## 2022-04-28 ENCOUNTER — Ambulatory Visit: Payer: Medicare Other

## 2022-04-30 ENCOUNTER — Ambulatory Visit: Payer: Medicare Other

## 2022-04-30 DIAGNOSIS — M79602 Pain in left arm: Secondary | ICD-10-CM

## 2022-04-30 DIAGNOSIS — M6281 Muscle weakness (generalized): Secondary | ICD-10-CM
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
## 2022-05-06 ENCOUNTER — Ambulatory Visit
Admission: RE | Admit: 2022-05-06 | Discharge: 2022-05-06 | Disposition: A | Discharge status: Home / Self Care | Payer: Medicare Other | Source: Ambulatory Visit | Attending: Orthopedic Surgery | Admitting: Orthopedic Surgery

## 2022-05-06 DIAGNOSIS — M5412 Radiculopathy, cervical region: Secondary | ICD-10-CM

## 2022-05-06 DIAGNOSIS — M4312 Spondylolisthesis, cervical region: Secondary | ICD-10-CM | POA: Diagnosis not present

## 2022-05-06 DIAGNOSIS — M25512 Pain in left shoulder: Secondary | ICD-10-CM | POA: Diagnosis not present

## 2022-05-06 DIAGNOSIS — M79602 Pain in left arm: Secondary | ICD-10-CM

## 2022-05-06 DIAGNOSIS — S4992XA Unspecified injury of left shoulder and upper arm, initial encounter: Secondary | ICD-10-CM | POA: Diagnosis not present

## 2022-05-06 MED ORDER — IOPAMIDOL (ISOVUE-M 200) INJECTION 41%
13.0000 mL | Freq: Once | INTRAMUSCULAR | Status: AC
  Administered 2022-05-06: 13 mL via INTRA_ARTICULAR

## 2022-05-13 ENCOUNTER — Ambulatory Visit (INDEPENDENT_AMBULATORY_CARE_PROVIDER_SITE_OTHER): Payer: Medicare Other | Admitting: Surgical

## 2022-05-13 ENCOUNTER — Ambulatory Visit: Payer: Self-pay

## 2022-05-13 ENCOUNTER — Encounter: Payer: Self-pay | Admitting: Orthopedic Surgery

## 2022-05-13 DIAGNOSIS — M79602 Pain in left arm: Secondary | ICD-10-CM | POA: Diagnosis not present

## 2022-05-13 DIAGNOSIS — M19012 Primary osteoarthritis, left shoulder: Secondary | ICD-10-CM

## 2022-05-13 DIAGNOSIS — M5412 Radiculopathy, cervical region: Secondary | ICD-10-CM | POA: Diagnosis not present

## 2022-05-13 MED ORDER — METHYLPREDNISOLONE ACETATE 40 MG/ML IJ SUSP
13.3300 mg | INTRAMUSCULAR | Status: AC | PRN
  Administered 2022-05-13: 13.33 mg via INTRA_ARTICULAR

## 2022-05-13 MED ORDER — BUPIVACAINE HCL 0.25 % IJ SOLN
0.6600 mL | INTRAMUSCULAR | Status: AC | PRN
  Administered 2022-05-13: .66 mL via INTRA_ARTICULAR

## 2022-05-13 MED ORDER — LIDOCAINE HCL 1 % IJ SOLN
3.0000 mL | INTRAMUSCULAR | Status: AC | PRN
  Administered 2022-05-13: 3 mL
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
## 2022-05-14 ENCOUNTER — Encounter: Payer: Self-pay | Admitting: Emergency Medicine

## 2022-05-14 ENCOUNTER — Ambulatory Visit (INDEPENDENT_AMBULATORY_CARE_PROVIDER_SITE_OTHER): Payer: Medicare Other | Admitting: Emergency Medicine

## 2022-05-14 VITALS — BP 128/74 | HR 58 | Temp 97.9°F | Ht <= 58 in | Wt 156.0 lb

## 2022-05-14 DIAGNOSIS — K219 Gastro-esophageal reflux disease without esophagitis: Secondary | ICD-10-CM

## 2022-05-14 DIAGNOSIS — E1122 Type 2 diabetes mellitus with diabetic chronic kidney disease: Secondary | ICD-10-CM

## 2022-05-14 DIAGNOSIS — E785 Hyperlipidemia, unspecified: Secondary | ICD-10-CM

## 2022-05-14 DIAGNOSIS — E1169 Type 2 diabetes mellitus with other specified complication: Secondary | ICD-10-CM | POA: Diagnosis not present

## 2022-05-14 DIAGNOSIS — E1159 Type 2 diabetes mellitus with other circulatory complications: Secondary | ICD-10-CM | POA: Diagnosis not present

## 2022-05-14 DIAGNOSIS — I152 Hypertension secondary to endocrine disorders: Secondary | ICD-10-CM | POA: Diagnosis not present

## 2022-05-14 DIAGNOSIS — N183 Chronic kidney disease, stage 3 unspecified: Secondary | ICD-10-CM | POA: Diagnosis not present

## 2022-05-14 LAB — POCT GLYCOSYLATED HEMOGLOBIN (HGB A1C): Hemoglobin A1C: 6.6 % — AB (ref 4.0–5.6)

## 2022-05-14 LAB — COMPREHENSIVE METABOLIC PANEL
ALT: 13 U/L (ref 0–35)
AST: 17 U/L (ref 0–37)
Albumin: 3.6 g/dL (ref 3.5–5.2)
Alkaline Phosphatase: 77 U/L (ref 39–117)
BUN: 22 mg/dL (ref 6–23)
CO2: 29 mEq/L (ref 19–32)
Calcium: 9 mg/dL (ref 8.4–10.5)
Chloride: 106 mEq/L (ref 96–112)
Creatinine, Ser: 2.32 mg/dL — ABNORMAL HIGH (ref 0.40–1.20)
GFR: 21.67 mL/min — ABNORMAL LOW (ref >60.00)
Glucose, Bld: 145 mg/dL — ABNORMAL HIGH (ref 70–99)
Potassium: 4 mEq/L (ref 3.5–5.1)
Sodium: 142 mEq/L (ref 135–145)
Total Bilirubin: 0.3 mg/dL (ref 0.2–1.2)
Total Protein: 7 g/dL (ref 6.0–8.3)

## 2022-05-14 LAB — CBC WITH DIFFERENTIAL/PLATELET
Basophils Absolute: 0.1 10*3/uL (ref 0.0–0.1)
Basophils Relative: 0.6 % (ref 0.0–3.0)
Eosinophils Absolute: 0 10*3/uL (ref 0.0–0.7)
Eosinophils Relative: 0.3 % (ref 0.0–5.0)
HCT: 37.1 % (ref 36.0–46.0)
Hemoglobin: 12.4 g/dL (ref 12.0–15.0)
Lymphocytes Relative: 25.8 % (ref 12.0–46.0)
Lymphs Abs: 2.8 10*3/uL (ref 0.7–4.0)
MCHC: 33.4 g/dL (ref 30.0–36.0)
MCV: 81.9 fl (ref 78.0–100.0)
Monocytes Absolute: 0.7 10*3/uL (ref 0.1–1.0)
Monocytes Relative: 6.8 % (ref 3.0–12.0)
Neutro Abs: 7.3 10*3/uL (ref 1.4–7.7)
Neutrophils Relative %: 66.5 % (ref 43.0–77.0)
Platelets: 308 10*3/uL (ref 150.0–400.0)
RBC: 4.54 Mil/uL (ref 3.87–5.11)
RDW: 14.7 % (ref 11.5–15.5)
WBC: 10.9 10*3/uL — ABNORMAL HIGH (ref 4.0–10.5)

## 2022-05-14 LAB — LIPID PANEL
Cholesterol: 161 mg/dL (ref 0–200)
HDL: 57 mg/dL (ref >39.00)
LDL Cholesterol: 90 mg/dL (ref 0–99)
NonHDL: 104.47
Total CHOL/HDL Ratio: 3
Triglycerides: 71 mg/dL (ref 0.0–149.0)
VLDL: 14.2 mg/dL (ref 0.0–40.0)

## 2022-05-14 NOTE — Assessment & Plan Note (Signed)
Stable.  Sees nephrologist on a regular basis. Advised to stay well-hydrated and avoid NSAIDs.

## 2022-05-14 NOTE — Patient Instructions (Signed)

## 2022-05-14 NOTE — Assessment & Plan Note (Signed)
Stable.  Continue atorvastatin 40 mg daily.  Diet and nutrition discussed. The 10-year ASCVD risk score (Arnett DK, et al., 2019) is: 14.7%   Values used to calculate the score:     Age: 66 years     Sex: Female     Is Non-Hispanic African American: Yes     Diabetic: Yes     Tobacco smoker: No     Systolic Blood Pressure: 773 mmHg     Is BP treated: Yes     HDL Cholesterol: 51 mg/dL     Total Cholesterol: 140 mg/dL

## 2022-05-14 NOTE — Assessment & Plan Note (Signed)
Stable.  Continue  omeprazole 40mg daily

## 2022-05-14 NOTE — Assessment & Plan Note (Signed)
Well-controlled hypertension.  Continue olmesartan 40 mg daily and diltiazem XR 240 mg daily. Well-controlled diabetes with hemoglobin A1c of 6.6. Continue insulin glargine 32 units daily along with Farxiga 10 mg and Trulicity 1.5 mg weekly. Follow-up in 3 months.
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
## 2022-06-10 ENCOUNTER — Other Ambulatory Visit: Payer: Self-pay | Admitting: Emergency Medicine

## 2022-06-10 DIAGNOSIS — Z1231 Encounter for screening mammogram for malignant neoplasm of breast: Secondary | ICD-10-CM

## 2022-06-18 ENCOUNTER — Encounter: Payer: Self-pay | Admitting: Physical Medicine and Rehabilitation

## 2022-06-18 ENCOUNTER — Ambulatory Visit: Payer: Self-pay

## 2022-06-18 ENCOUNTER — Ambulatory Visit (INDEPENDENT_AMBULATORY_CARE_PROVIDER_SITE_OTHER): Payer: Medicare Other | Admitting: Physical Medicine and Rehabilitation

## 2022-06-18 ENCOUNTER — Other Ambulatory Visit: Payer: Self-pay | Admitting: Student

## 2022-06-18 VITALS — BP 147/79 | HR 64

## 2022-06-18 DIAGNOSIS — M5412 Radiculopathy, cervical region: Secondary | ICD-10-CM | POA: Diagnosis not present

## 2022-06-18 DIAGNOSIS — E11319 Type 2 diabetes mellitus with unspecified diabetic retinopathy without macular edema: Secondary | ICD-10-CM

## 2022-06-18 MED ORDER — METHYLPREDNISOLONE ACETATE 80 MG/ML IJ SUSP
80.0000 mg | Freq: Once | INTRAMUSCULAR | Status: AC
  Administered 2022-06-18: 80 mg

## 2022-06-18 NOTE — Patient Instructions (Signed)

## 2022-06-18 NOTE — Progress Notes (Signed)
Pt state neck pain that travels travels down her left shoulder. Pt state any movement makes the pain worse. Pt state she doesn't take anything for the pain.  Numeric Pain Rating Scale and Functional Assessment Average Pain 9   In the last MONTH (on 0-10 scale) has pain interfered with the following?  1. General activity like being  able to carry out your everyday physical activities such as walking, climbing stairs, carrying groceries, or moving a chair?  Rating(10)   +Driver, -BT, -Dye Allergies.

## 2022-06-24 ENCOUNTER — Ambulatory Visit (INDEPENDENT_AMBULATORY_CARE_PROVIDER_SITE_OTHER): Payer: Medicare Other | Admitting: Orthopedic Surgery

## 2022-06-24 DIAGNOSIS — M79602 Pain in left arm: Secondary | ICD-10-CM

## 2022-06-29 ENCOUNTER — Encounter: Payer: Self-pay | Admitting: Orthopedic Surgery

## 2022-06-29 NOTE — Progress Notes (Signed)
Office Visit Note   Patient: Nancy [name]           Date of Birth: 08-04-1956           MRN: [id number] Visit Date: 06/24/2022 Requested by: Horald Pollen, [place],  [place] [address] PCP: Horald Pollen, MD  Subjective: Chief Complaint  Patient presents with   Neck - Follow-up    HPI: Nancy [name] is a 66 y.o. female who presents to the office complaining of left arm pain.  She reports continued left shoulder pain that travels down her left arm.  She notes neck stiffness and continued pain.  Not taking any medications for her pain.  She had AC joint injection at last appointment that did not really provide much relief.  She also had cervical spine injection with Dr. Ernestina Patches that may be helped 10%.  She has continued numbness tingling in her fingertips in all 5 fingers of the left hand.  However, she states her symptoms are not really bothering her enough for another injection..                ROS: All systems reviewed are negative as they relate to the chief complaint within the history of present illness.  Patient denies fevers or chills.  Assessment & Plan: Visit Diagnoses:  1. Left arm pain     Plan: Patient is a 66 year old female who presents for repeat evaluation of left shoulder pain with radiation of pain down the left arm.  AC joint injection did not provide much relief.  Cervical spine injection provided about 10% relief.  She does have a little bit of asymmetric loss of passive motion of the left shoulder that is very subtle on exam today.  Could be that she is in the early stages of adhesive capsulitis.  However, her current symptoms are not really bothering her enough for any intervention at this time.  She will follow-up with the office as needed.  Follow-Up Instructions: No follow-ups on file.   Orders:  No orders of the defined types were placed in this encounter.  No orders of the defined types were placed in this  encounter.     Procedures: No procedures performed   Clinical Data: No additional findings.  Objective: Vital Signs: LMP 02/15/2009   Physical Exam:  Constitutional: Patient appears well-developed HEENT:  Head: Normocephalic Eyes:EOM are normal Neck: Normal range of motion Cardiovascular: Normal rate Pulmonary/chest: Effort normal Neurologic: Patient is alert Skin: Skin is warm Psychiatric: Patient has normal mood and affect  Ortho Exam: Ortho exam demonstrates right shoulder with 60 degrees X rotation, 105 degrees abduction, 165 degrees forward flexion.  This compared with left shoulder with 60 degrees external rotation, 100 degrees abduction, 155 degrees forward flexion.  Excellent rotator cuff strength of supra, infra, subscap.  5/5 motor strength of bilateral grip strength, finger abduction, pronation/supination, bicep, tricep, deltoid.  Mild tenderness to palpation over the [facility] joint on the left side, no tenderness on the right.  Specialty Comments:  MRI CERVICAL SPINE WITHOUT CONTRAST   TECHNIQUE: Multiplanar, multisequence MR imaging of the cervical spine was performed. No intravenous contrast was administered.   COMPARISON:  Prior radiograph from 12/18/2018.   FINDINGS: Alignment: Straightening with slight reversal of the normal cervical lordosis. Trace degenerative anterolisthesis of C4 on C5.   Vertebrae: Tuber body height maintained without acute or chronic fracture. Bone marrow signal intensity diffusely heterogeneous. Few scattered benign hemangiomata noted. No worrisome osseous lesions  or abnormal marrow edema.   Cord: Normal signal morphology.   Posterior Fossa, vertebral arteries, paraspinal tissues: Visualized brain and posterior fossa within normal limits. Craniocervical junction normal. Visualized paraspinous soft tissues within normal limits. Normal flow voids seen within the vertebral arteries bilaterally.   Disc levels:   C2-C3: Negative  interspace. Mild facet hypertrophy. No significant canal or foraminal stenosis.   C3-C4: Negative interspace. Mild right greater than left facet hypertrophy. No canal or foraminal stenosis.   C4-C5: Trace anterolisthesis. Minimal endplate spurring without significant disc bulge. Left-sided facet hypertrophy. No significant spinal stenosis. Foramina remain patent.   C5-C6: Broad-based central disc protrusion with annular fissure indents the ventral thecal sac (series 8, image 19). Mild flattening of the ventral cord without cord signal changes. No more than mild spinal stenosis. Superimposed uncovertebral spurring without significant foraminal encroachment.   C6-C7: Mild diffuse disc bulge with endplate and uncovertebral spurring. Mild ligamentum flavum hypertrophy. No significant spinal stenosis. Foramina remain patent.   C7-T1: Right-sided uncovertebral spurring without significant disc bulge. Mild facet and ligament flavum hypertrophy. No spinal stenosis. Foramina remain patent.   Visualized upper thoracic spine demonstrates no significant finding.   IMPRESSION: 1. Broad-based central disc protrusion at C5-6 with resultant mild spinal stenosis. 2. Additional mild noncompressive disc bulging at C6-7 without significant stenosis or impingement. 3. Mild to moderate multilevel facet hypertrophy as above, which could contribute to underlying neck pain. No significant foraminal encroachment within the cervical spine.     Electronically Signed   By: Jeannine Boga M.D.   On: 05/07/2022 07:16  Imaging: No results found.   PMFS History: Patient Active Problem List   Diagnosis Date Noted   Stage 4 chronic kidney disease ([place]) 02/09/2022   Hyperlipidemia associated with type 2 diabetes mellitus ([place]) 04/20/2019   Vaginal atrophy 07/22/2018   Morbid obesity ([place]) 08/04/2017   CKD stage 3 due to type 2 diabetes mellitus ([place]) 08/02/2017   GERD (gastroesophageal reflux  disease) 01/16/2014   Carpal tunnel syndrome, bilateral    Hypertension associated with diabetes ([place]) 11/09/2006   Controlled diabetes mellitus with retinopathy ([place]) 12/07/2002   Past Medical History:  Diagnosis Date   Carpal tunnel syndrome, bilateral    confirmed on nerve conduction studies   Chronic hepatitis C ([place])    considered cured 06/2016 post retroviral therapy    Diabetes mellitus type 2, uncontrolled    Diabetic peripheral neuropathy ([facility])    GERD (gastroesophageal reflux disease)    History of endometriosis    History of Helicobacter infection 11/07   Hyperlipidemia    Liver fibrosis 02/17/2016   F2/3     Family History  Problem Relation Age of Onset   Cancer Mother        lung   Kidney disease Mother    Lung cancer Maternal Uncle    Breast cancer Maternal Grandmother    Liver cancer Maternal Grandfather    Heart disease Brother    Depression Brother    Diabetes Father    Dementia Father    Colon cancer Neg Hx     Past Surgical History:  Procedure Laterality Date   ABDOMINAL HYSTERECTOMY     CARPAL TUNNEL RELEASE     Social History   Occupational History   Occupation: Forensic psychologist: [employer]   Occupation: Disabled  Tobacco Use   Smoking status: Never   Smokeless tobacco: Never  Vaping Use   Vaping Use: Never used  Substance and Sexual Activity  Alcohol use: No    Alcohol/week: 0.0 standard drinks of alcohol   Drug use: No   Sexual activity: Yes    Birth control/protection: None    Comment: 1 committed partner

## 2022-07-06 ENCOUNTER — Ambulatory Visit
Admission: RE | Admit: 2022-07-06 | Discharge: 2022-07-06 | Disposition: A | Discharge status: Home / Self Care | Payer: Medicare Other | Source: Ambulatory Visit | Attending: Emergency Medicine | Admitting: Emergency Medicine

## 2022-07-06 DIAGNOSIS — Z1231 Encounter for screening mammogram for malignant neoplasm of breast: Secondary | ICD-10-CM | POA: Diagnosis not present

## 2022-07-06 NOTE — Progress Notes (Signed)
Nancy [name] - 66 y.o. female MRN [id number]  Date of birth: 1956/11/24  Office Visit Note: Visit Date: 06/18/2022 PCP: Horald Pollen, MD Referred by: Horald Pollen, *  Subjective: Chief Complaint  Patient presents with   Neck - Pain   Left Shoulder - Pain   HPI:  Nancy [name] is a 66 y.o. female who comes in today at the request of Dr. Anderson Malta for planned Left C7-T1 Cervical Interlaminar epidural steroid injection with fluoroscopic guidance.  The patient has failed conservative care including home exercise, medications, time and activity modification.  This injection will be diagnostic and hopefully therapeutic.  Please see requesting physician notes for further details and justification.   ROS Otherwise per HPI.  Assessment & Plan: Visit Diagnoses:    ICD-10-CM   1. Cervical radiculopathy  M54.12 XR C-ARM NO REPORT    Epidural Steroid injection    methylPREDNISolone acetate (DEPO-MEDROL) injection 80 mg      Plan: No additional findings.   Meds & Orders:  Meds ordered this encounter  Medications   methylPREDNISolone acetate (DEPO-MEDROL) injection 80 mg    Orders Placed This Encounter  Procedures   XR C-ARM NO REPORT   Epidural Steroid injection    Follow-up: Return for visit to requesting provider as needed.   Procedures: No procedures performed  Cervical Epidural Steroid Injection - Interlaminar Approach with Fluoroscopic Guidance  Patient: Nancy [name]      Date of Birth: January 12, 1956 MRN: [id number] PCP: Horald Pollen, MD      Visit Date: 06/18/2022   Universal Protocol:    Date/Time: 07/31/235:51 AM  Consent Given By: the patient  Position: PRONE  Additional Comments: Vital signs were monitored before and after the procedure. Patient was prepped and draped in the usual sterile fashion. The correct patient, procedure, and site was verified.   Injection Procedure Details:   Procedure diagnoses: Cervical  radiculopathy [M54.12]    Meds Administered:  Meds ordered this encounter  Medications   methylPREDNISolone acetate (DEPO-MEDROL) injection 80 mg     Laterality: Left  Location/Site: C7-T1  Needle: 3.5 in., 20 ga. Tuohy  Needle Placement: Paramedian epidural space  Findings:  -Comments: Excellent flow of contrast into the epidural space.  Procedure Details: Using a paramedian approach from the side mentioned above, the region overlying the inferior lamina was localized under fluoroscopic visualization and the soft tissues overlying this structure were infiltrated with 4 ml. of 1% Lidocaine without Epinephrine. A # 20 gauge, Tuohy needle was inserted into the epidural space using a paramedian approach.  The epidural space was localized using loss of resistance along with contralateral oblique bi-planar fluoroscopic views.  After negative aspirate for air, blood, and CSF, a 2 ml. volume of Isovue-250 was injected into the epidural space and the flow of contrast was observed. Radiographs were obtained for documentation purposes.   The injectate was administered into the level noted above.  Additional Comments:  The patient tolerated the procedure well Dressing: 2 x 2 sterile gauze and Band-Aid    Post-procedure details: Patient was observed during the procedure. Post-procedure instructions were reviewed.  Patient left the clinic in stable condition.   Clinical History: MRI CERVICAL SPINE WITHOUT CONTRAST   TECHNIQUE: Multiplanar, multisequence MR imaging of the cervical spine was performed. No intravenous contrast was administered.   COMPARISON:  Prior radiograph from 12/18/2018.   FINDINGS: Alignment: Straightening with slight reversal of the normal cervical lordosis. Trace degenerative anterolisthesis of C4 on C5.  Vertebrae: Tuber body height maintained without acute or chronic fracture. Bone marrow signal intensity diffusely heterogeneous. Few scattered benign  hemangiomata noted. No worrisome osseous lesions or abnormal marrow edema.   Cord: Normal signal morphology.   Posterior Fossa, vertebral arteries, paraspinal tissues: Visualized brain and posterior fossa within normal limits. Craniocervical junction normal. Visualized paraspinous soft tissues within normal limits. Normal flow voids seen within the vertebral arteries bilaterally.   Disc levels:   C2-C3: Negative interspace. Mild facet hypertrophy. No significant canal or foraminal stenosis.   C3-C4: Negative interspace. Mild right greater than left facet hypertrophy. No canal or foraminal stenosis.   C4-C5: Trace anterolisthesis. Minimal endplate spurring without significant disc bulge. Left-sided facet hypertrophy. No significant spinal stenosis. Foramina remain patent.   C5-C6: Broad-based central disc protrusion with annular fissure indents the ventral thecal sac (series 8, image 19). Mild flattening of the ventral cord without cord signal changes. No more than mild spinal stenosis. Superimposed uncovertebral spurring without significant foraminal encroachment.   C6-C7: Mild diffuse disc bulge with endplate and uncovertebral spurring. Mild ligamentum flavum hypertrophy. No significant spinal stenosis. Foramina remain patent.   C7-T1: Right-sided uncovertebral spurring without significant disc bulge. Mild facet and ligament flavum hypertrophy. No spinal stenosis. Foramina remain patent.   Visualized upper thoracic spine demonstrates no significant finding.   IMPRESSION: 1. Broad-based central disc protrusion at C5-6 with resultant mild spinal stenosis. 2. Additional mild noncompressive disc bulging at C6-7 without significant stenosis or impingement. 3. Mild to moderate multilevel facet hypertrophy as above, which could contribute to underlying neck pain. No significant foraminal encroachment within the cervical spine.     Electronically Signed   By: Jeannine Boga M.D.   On: 05/07/2022 07:16     Objective:  VS:  HT:    WT:   BMI:     BP:(!) 147/79  HR:64bpm  TEMP: ( )  RESP:  Physical Exam Vitals and nursing note reviewed.  Constitutional:      General: She is not in acute distress.    Appearance: Normal appearance. She is not ill-appearing.  HENT:     Head: Normocephalic and atraumatic.     Right Ear: External ear normal.     Left Ear: External ear normal.  Eyes:     Extraocular Movements: Extraocular movements intact.  Cardiovascular:     Rate and Rhythm: Normal rate.     Pulses: Normal pulses.  Musculoskeletal:     Cervical back: Tenderness present. No rigidity.     Right lower leg: No edema.     Left lower leg: No edema.     Comments: Patient has good strength in the upper extremities including 5 out of 5 strength in wrist extension long finger flexion and APB.  There is no atrophy of the hands intrinsically.  There is a negative Hoffmann's test.   Lymphadenopathy:     Cervical: No cervical adenopathy.  Skin:    Findings: No erythema, lesion or rash.  Neurological:     General: No focal deficit present.     Mental Status: She is alert and oriented to person, place, and time.     Sensory: No sensory deficit.     Motor: No weakness or abnormal muscle tone.     Coordination: Coordination normal.  Psychiatric:        Mood and Affect: Mood normal.        Behavior: Behavior normal.      Imaging: No results found.

## 2022-07-06 NOTE — Procedures (Signed)
Cervical Epidural Steroid Injection - Interlaminar Approach with Fluoroscopic Guidance  Patient: Nancy [name]      Date of Birth: Jun 30, 1956 MRN: [id number] PCP: Horald Pollen, MD      Visit Date: 06/18/2022   Universal Protocol:    Date/Time: 07/31/235:51 AM  Consent Given By: the patient  Position: PRONE  Additional Comments: Vital signs were monitored before and after the procedure. Patient was prepped and draped in the usual sterile fashion. The correct patient, procedure, and site was verified.   Injection Procedure Details:   Procedure diagnoses: Cervical radiculopathy [M54.12]    Meds Administered:  Meds ordered this encounter  Medications   methylPREDNISolone acetate (DEPO-MEDROL) injection 80 mg     Laterality: Left  Location/Site: C7-T1  Needle: 3.5 in., 20 ga. Tuohy  Needle Placement: Paramedian epidural space  Findings:  -Comments: Excellent flow of contrast into the epidural space.  Procedure Details: Using a paramedian approach from the side mentioned above, the region overlying the inferior lamina was localized under fluoroscopic visualization and the soft tissues overlying this structure were infiltrated with 4 ml. of 1% Lidocaine without Epinephrine. A # 20 gauge, Tuohy needle was inserted into the epidural space using a paramedian approach.  The epidural space was localized using loss of resistance along with contralateral oblique bi-planar fluoroscopic views.  After negative aspirate for air, blood, and CSF, a 2 ml. volume of Isovue-250 was injected into the epidural space and the flow of contrast was observed. Radiographs were obtained for documentation purposes.   The injectate was administered into the level noted above.  Additional Comments:  The patient tolerated the procedure well Dressing: 2 x 2 sterile gauze and Band-Aid    Post-procedure details: Patient was observed during the procedure. Post-procedure instructions were  reviewed.  Patient left the clinic in stable condition.

## 2022-07-18 ENCOUNTER — Other Ambulatory Visit: Payer: Self-pay | Admitting: Student

## 2022-07-18 DIAGNOSIS — K219 Gastro-esophageal reflux disease without esophagitis: Secondary | ICD-10-CM

## 2022-08-15 ENCOUNTER — Other Ambulatory Visit: Payer: Self-pay | Admitting: Student

## 2022-08-15 DIAGNOSIS — E11319 Type 2 diabetes mellitus with unspecified diabetic retinopathy without macular edema: Secondary | ICD-10-CM

## 2022-08-17 ENCOUNTER — Other Ambulatory Visit: Payer: Self-pay | Admitting: Student in an Organized Health Care Education/Training Program

## 2022-08-17 ENCOUNTER — Ambulatory Visit (INDEPENDENT_AMBULATORY_CARE_PROVIDER_SITE_OTHER): Payer: Medicare Other | Admitting: Emergency Medicine

## 2022-08-17 ENCOUNTER — Encounter: Payer: Self-pay | Admitting: Emergency Medicine

## 2022-08-17 VITALS — BP 130/70 | HR 68 | Temp 98.2°F | Ht <= 58 in | Wt 156.4 lb

## 2022-08-17 DIAGNOSIS — Z794 Long term (current) use of insulin: Secondary | ICD-10-CM | POA: Diagnosis not present

## 2022-08-17 DIAGNOSIS — E1169 Type 2 diabetes mellitus with other specified complication: Secondary | ICD-10-CM

## 2022-08-17 DIAGNOSIS — E1159 Type 2 diabetes mellitus with other circulatory complications: Secondary | ICD-10-CM

## 2022-08-17 DIAGNOSIS — E11319 Type 2 diabetes mellitus with unspecified diabetic retinopathy without macular edema: Secondary | ICD-10-CM | POA: Diagnosis not present

## 2022-08-17 DIAGNOSIS — I152 Hypertension secondary to endocrine disorders: Secondary | ICD-10-CM | POA: Diagnosis not present

## 2022-08-17 DIAGNOSIS — E785 Hyperlipidemia, unspecified: Secondary | ICD-10-CM

## 2022-08-17 DIAGNOSIS — N184 Chronic kidney disease, stage 4 (severe): Secondary | ICD-10-CM | POA: Diagnosis not present

## 2022-08-17 LAB — POCT GLYCOSYLATED HEMOGLOBIN (HGB A1C): Hemoglobin A1C: 7.6 % — AB (ref 4.0–5.6)

## 2022-08-17 MED ORDER — CYCLOBENZAPRINE HCL 10 MG PO TABS: 10.0000 mg | ORAL_TABLET | Freq: Every day | ORAL | 0 refills | Status: DC

## 2022-08-17 NOTE — Patient Instructions (Signed)

## 2022-08-17 NOTE — Assessment & Plan Note (Signed)
Well-controlled hypertension with normal blood pressure readings at home. Continue olmesartan 40 mg daily. Uncontrolled diabetes with hemoglobin A1c higher than before at 7.6. Has been noncompliant with diet. Continue insulin glargine 32 units daily, Farxiga 10 mg daily and Trulicity 1.5 mg weekly. Diet and nutrition discussed. Cardio vascular risks associated with uncontrolled diabetes discussed. Follow-up in 3 months.
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
## 2022-08-17 NOTE — Assessment & Plan Note (Signed)
Advised to stay well-hydrated and avoid NSAIDs. Sees nephrologist on a regular basis.  Next office visit next week.

## 2022-08-17 NOTE — Assessment & Plan Note (Signed)
Diet and nutrition discussed. Continue atorvastatin 40 mg daily. 

## 2022-08-18 DIAGNOSIS — N184 Chronic kidney disease, stage 4 (severe): Secondary | ICD-10-CM | POA: Diagnosis not present

## 2022-08-25 ENCOUNTER — Ambulatory Visit: Payer: Self-pay | Admitting: Licensed Clinical Social Worker

## 2022-08-25 DIAGNOSIS — E1122 Type 2 diabetes mellitus with diabetic chronic kidney disease: Secondary | ICD-10-CM | POA: Diagnosis not present

## 2022-08-25 DIAGNOSIS — I129 Hypertensive chronic kidney disease with stage 1 through stage 4 chronic kidney disease, or unspecified chronic kidney disease: Secondary | ICD-10-CM | POA: Diagnosis not present

## 2022-08-25 DIAGNOSIS — R801 Persistent proteinuria, unspecified: Secondary | ICD-10-CM | POA: Diagnosis not present

## 2022-08-25 DIAGNOSIS — N184 Chronic kidney disease, stage 4 (severe): Secondary | ICD-10-CM | POA: Diagnosis not present

## 2022-08-25 DIAGNOSIS — N2581 Secondary hyperparathyroidism of renal origin: Secondary | ICD-10-CM | POA: Diagnosis not present

## 2022-08-25 DIAGNOSIS — R768 Other specified abnormal immunological findings in serum: Secondary | ICD-10-CM | POA: Diagnosis not present

## 2022-08-25 NOTE — Patient Instructions (Signed)
Visit Information  Thank you for taking time to visit with me today. Please don't hesitate to contact me if I can be of assistance to you.   Following are the goals we discussed today:   Goals Addressed               This Visit's Progress     Care Coordination Activities (pt-stated)        Care Coordination Interventions: Reviewed scheduled/upcoming provider appointments including . Discussed plans with patient for ongoing care management follow up and provided patient with direct contact information for care management team Assessed social determinant of health barriers  Patient declined needing any assistance at this time.         Please call the care guide team at [phone] if you need to cancel or reschedule your appointment.     Patient verbalizes understanding of instructions and care plan provided today and agrees to view in [place]. Active MyChart status and patient understanding of how to access instructions and care plan via MyChart confirmed with patient.     No further follow up required: .  Lenor Derrick, MSW  Social Worker [facility]  [phone]

## 2022-08-25 NOTE — Patient Outreach (Signed)
  Care Coordination   Initial Visit Note   08/25/2022 Name: Nancy [name] MRN: [id number] DOB: 01/19/1956  Nancy [name] is a 66 y.o. year old female who sees [name], Ines Bloomer, MD for primary care. I spoke with  Nancy [name] by phone today.  What matters to the patients health and wellness today?  Patient declined needing any assistance at this time.    Goals Addressed               This Visit's Progress     Care Coordination Activities (pt-stated)        Care Coordination Interventions: Reviewed scheduled/upcoming provider appointments including . Discussed plans with patient for ongoing care management follow up and provided patient with direct contact information for care management team Assessed social determinant of health barriers  Patient declined needing any assistance at this time.        SDOH assessments and interventions completed:  Yes     Care Coordination Interventions Activated:  Yes  Care Coordination Interventions:  Yes, provided   Follow up plan: No further intervention required.   Encounter Outcome:  Pt. Visit Completed   Lenor Derrick, MSW  Social Worker [facility]  [phone]

## 2022-09-04 ENCOUNTER — Other Ambulatory Visit: Payer: Self-pay | Admitting: Student in an Organized Health Care Education/Training Program

## 2022-09-04 ENCOUNTER — Other Ambulatory Visit: Payer: Self-pay | Admitting: Student

## 2022-09-04 ENCOUNTER — Telehealth: Payer: Self-pay | Admitting: Emergency Medicine

## 2022-09-04 DIAGNOSIS — E1159 Type 2 diabetes mellitus with other circulatory complications: Secondary | ICD-10-CM

## 2022-09-04 DIAGNOSIS — Z794 Long term (current) use of insulin: Secondary | ICD-10-CM

## 2022-09-04 NOTE — Telephone Encounter (Signed)
MEDICATION: olmesartan (BENICAR) 40 MG tablet // FARXIGA 10 MG TABS tablet  PHARMACY: [id number]  [address]  Comments: Patient only needs these two prescriptions. Will be out on Sunday.   **Let patient know to contact pharmacy at the end of the day to make sure medication is ready. **  ** Please notify patient to allow 48-72 hours to process**  **Encourage patient to contact the pharmacy for refills or they can [facility]

## 2022-09-04 NOTE — Telephone Encounter (Signed)
Patient needs refills on farxiga, olmeszarten, and flexeril.  [facility] [address], [place]

## 2022-09-07 MED ORDER — OLMESARTAN MEDOXOMIL 40 MG PO TABS: 40.0000 mg | ORAL_TABLET | Freq: Every day | ORAL | 3 refills | Status: DC

## 2022-09-07 NOTE — Telephone Encounter (Signed)
Okay to fill? 

## 2022-09-07 NOTE — Telephone Encounter (Signed)
Called patient to inform her that her refill was sent to her pharmacy

## 2022-10-02 ENCOUNTER — Telehealth: Payer: Self-pay | Admitting: Emergency Medicine

## 2022-10-02 ENCOUNTER — Other Ambulatory Visit: Payer: Self-pay | Admitting: Student

## 2022-10-02 DIAGNOSIS — E11319 Type 2 diabetes mellitus with unspecified diabetic retinopathy without macular edema: Secondary | ICD-10-CM

## 2022-10-02 MED ORDER — CYCLOBENZAPRINE HCL 10 MG PO TABS: 10.0000 mg | ORAL_TABLET | Freq: Every day | ORAL | 1 refills | Status: DC

## 2022-10-02 NOTE — Telephone Encounter (Signed)
Flexeril refill done  Sorry, I can only refill other medications that have been prescribed per Dr Mitchel Honour

## 2022-10-02 NOTE — Telephone Encounter (Signed)
Called patient to inform her that 1 medication was sent to her pharmacy but the omeprazole and lantus was not due to medication being prescribed by another provider. Sent message to Dr. Mitchel Honour to see if he will fill these meds. Provider will be back in office on Monday.   Unable to leave message due to patient VM being full, if patient calls back please inform her of above message.

## 2022-10-02 NOTE — Telephone Encounter (Signed)
Patient is completely out and would like to know if she can get a refill before the end of the day.

## 2022-10-02 NOTE — Telephone Encounter (Signed)
Patient called for several requests: Primary concern: IINSILIN refill needed.  Only enough for today  Call Back Number: [phone]  Date of Last Office Visit: 08/17/22  Date of Next Office Visit: 11/17/22 Medication(s) to be Refilled: INSULIN OMEPRIZOLE 40 MG CYCLOBENZAPRINE  [facility] [address]  COMMENTS: Pt states if omeprasole cannot be refilled, please advise what else she can take.   ** Please notify patient to allow 48-72 hours to process** **Let patient know to contact pharmacy at the end of the day to make sure medication is ready. ** **If patient has not been seen in a year or longer, book an appointment **Advise to use MyChart for refill requests OR to contact their pharmacy

## 2022-10-04 ENCOUNTER — Other Ambulatory Visit: Payer: Self-pay | Admitting: Emergency Medicine

## 2022-10-04 DIAGNOSIS — E11319 Type 2 diabetes mellitus with unspecified diabetic retinopathy without macular edema: Secondary | ICD-10-CM

## 2022-10-04 MED ORDER — INSULIN GLARGINE 100 UNIT/ML ~~LOC~~ SOLN: SUBCUTANEOUS | 1 refills | Status: DC

## 2022-10-04 NOTE — Telephone Encounter (Signed)
New prescription for insulin sent to pharmacy of record today.  Thanks.

## 2022-10-05 NOTE — Telephone Encounter (Signed)
Called patient to inform her that her insulin was sent to the pharmacy. Unable to leave message due to mailbox being full

## 2022-10-12 ENCOUNTER — Other Ambulatory Visit: Payer: Self-pay | Admitting: Emergency Medicine

## 2022-10-12 ENCOUNTER — Encounter: Payer: Self-pay | Admitting: *Deleted

## 2022-10-12 DIAGNOSIS — I152 Hypertension secondary to endocrine disorders: Secondary | ICD-10-CM

## 2022-10-12 NOTE — Telephone Encounter (Signed)
This encounter was created in error - please disregard.

## 2022-10-29 ENCOUNTER — Other Ambulatory Visit: Payer: Self-pay | Admitting: Student

## 2022-10-29 DIAGNOSIS — Z794 Long term (current) use of insulin: Secondary | ICD-10-CM

## 2022-11-17 ENCOUNTER — Encounter: Payer: Self-pay | Admitting: Emergency Medicine

## 2022-11-17 ENCOUNTER — Ambulatory Visit (INDEPENDENT_AMBULATORY_CARE_PROVIDER_SITE_OTHER): Payer: Medicare Other | Admitting: Emergency Medicine

## 2022-11-17 VITALS — BP 122/72 | HR 64 | Temp 98.7°F | Ht <= 58 in | Wt 156.0 lb

## 2022-11-17 DIAGNOSIS — N184 Chronic kidney disease, stage 4 (severe): Secondary | ICD-10-CM

## 2022-11-17 DIAGNOSIS — E1159 Type 2 diabetes mellitus with other circulatory complications: Secondary | ICD-10-CM | POA: Diagnosis not present

## 2022-11-17 DIAGNOSIS — I152 Hypertension secondary to endocrine disorders: Secondary | ICD-10-CM | POA: Diagnosis not present

## 2022-11-17 DIAGNOSIS — E785 Hyperlipidemia, unspecified: Secondary | ICD-10-CM

## 2022-11-17 DIAGNOSIS — E1169 Type 2 diabetes mellitus with other specified complication: Secondary | ICD-10-CM | POA: Diagnosis not present

## 2022-11-17 LAB — POCT GLYCOSYLATED HEMOGLOBIN (HGB A1C): Hemoglobin A1C: 10.1 % — AB (ref 4.0–5.6)

## 2022-11-17 MED ORDER — TRULICITY 1.5 MG/0.5ML ~~LOC~~ SOAJ: 1.5000 mg | SUBCUTANEOUS | 3 refills | Status: DC

## 2022-11-17 NOTE — Patient Instructions (Signed)
Increase insulin glargine to 28 units daily Restart weekly Trulicity Continue Farxiga 10 mg daily Follow-up with kidney doctor as scheduled Follow-up with me in 3 months.  Diabetes Mellitus and Nutrition, Adult When you have diabetes, or diabetes mellitus, it is very important to have healthy eating habits because your blood sugar (glucose) levels are greatly affected by what you eat and drink. Eating healthy foods in the right amounts, at about the same times every day, can help you: Manage your blood glucose. Lower your risk of heart disease. Improve your blood pressure. Reach or maintain a healthy weight. What can affect my meal plan? Every person with diabetes is different, and each person has different needs for a meal plan. Your health care provider may recommend that you work with a dietitian to make a meal plan that is best for you. Your meal plan may vary depending on factors such as: The calories you need. The medicines you take. Your weight. Your blood glucose, blood pressure, and cholesterol levels. Your activity level. Other health conditions you have, such as heart or kidney disease. How do carbohydrates affect me? Carbohydrates, also called carbs, affect your blood glucose level more than any other type of food. Eating carbs raises the amount of glucose in your blood. It is important to know how many carbs you can safely have in each meal. This is different for every person. Your dietitian can help you calculate how many carbs you should have at each meal and for each snack. How does alcohol affect me? Alcohol can cause a decrease in blood glucose (hypoglycemia), especially if you use insulin or take certain diabetes medicines by mouth. Hypoglycemia can be a life-threatening condition. Symptoms of hypoglycemia, such as sleepiness, dizziness, and confusion, are similar to symptoms of having too much alcohol. Do not drink alcohol if: Your health care provider tells you not to  drink. You are pregnant, may be pregnant, or are planning to become pregnant. If you drink alcohol: Limit how much you have to: 0-1 drink a day for women. 0-2 drinks a day for men. Know how much alcohol is in your drink. In the [place] one drink equals one 12 oz bottle of beer (355 mL), one 5 oz glass of wine (148 mL), or one 1 oz glass of hard liquor (44 mL). Keep yourself hydrated with water, diet soda, or unsweetened iced tea. Keep in mind that regular soda, juice, and other mixers may contain a lot of sugar and must be counted as carbs. What are tips for following this plan?  Reading food labels Start by checking the serving size on the Nutrition Facts label of packaged foods and drinks. The number of calories and the amount of carbs, fats, and other nutrients listed on the label are based on one serving of the item. Many items contain more than one serving per package. Check the total grams (g) of carbs in one serving. Check the number of grams of saturated fats and trans fats in one serving. Choose foods that have a low amount or none of these fats. Check the number of milligrams (mg) of salt (sodium) in one serving. Most people should limit total sodium intake to less than 2,300 mg per day. Always check the nutrition information of foods labeled as "low-fat" or "nonfat." These foods may be higher in added sugar or refined carbs and should be avoided. Talk to your dietitian to identify your daily goals for nutrients listed on the label. Shopping Avoid buying canned, pre-made,  or processed foods. These foods tend to be high in fat, sodium, and added sugar. Shop around the outside edge of the grocery store. This is where you will most often find fresh fruits and vegetables, bulk grains, fresh meats, and fresh dairy products. Cooking Use low-heat cooking methods, such as baking, instead of high-heat cooking methods, such as deep frying. Cook using healthy oils, such as olive, canola, or  sunflower oil. Avoid cooking with butter, cream, or high-fat meats. Meal planning Eat meals and snacks regularly, preferably at the same times every day. Avoid going long periods of time without eating. Eat foods that are high in fiber, such as fresh fruits, vegetables, beans, and whole grains. Eat 4-6 oz (112-168 g) of lean protein each day, such as lean meat, chicken, fish, eggs, or tofu. One ounce (oz) (28 g) of lean protein is equal to: 1 oz (28 g) of meat, chicken, or fish. 1 egg.  cup (62 g) of tofu. Eat some foods each day that contain healthy fats, such as avocado, nuts, seeds, and fish. What foods should I eat? Fruits Berries. Apples. Oranges. Peaches. Apricots. Plums. Grapes. Mangoes. Papayas. Pomegranates. Kiwi. Cherries. Vegetables Leafy greens, including lettuce, spinach, kale, chard, collard greens, mustard greens, and cabbage. Beets. Cauliflower. Broccoli. Carrots. Green beans. Tomatoes. Peppers. Onions. Cucumbers. Brussels sprouts. Grains Whole grains, such as whole-wheat or whole-grain bread, crackers, tortillas, cereal, and pasta. Unsweetened oatmeal. Quinoa. Brown or wild rice. Meats and other proteins Seafood. Poultry without skin. Lean cuts of poultry and beef. Tofu. Nuts. Seeds. Dairy Low-fat or fat-free dairy products such as milk, yogurt, and cheese. The items listed above may not be a complete list of foods and beverages you can eat and drink. Contact a dietitian for more information. What foods should I avoid? Fruits Fruits canned with syrup. Vegetables Canned vegetables. Frozen vegetables with butter or cream sauce. Grains Refined white flour and flour products such as bread, pasta, snack foods, and cereals. Avoid all processed foods. Meats and other proteins Fatty cuts of meat. Poultry with skin. Breaded or fried meats. Processed meat. Avoid saturated fats. Dairy Full-fat yogurt, cheese, or milk. Beverages Sweetened drinks, such as soda or iced tea. The  items listed above may not be a complete list of foods and beverages you should avoid. Contact a dietitian for more information. Questions to ask a health care provider Do I need to meet with a certified diabetes care and education specialist? Do I need to meet with a dietitian? What number can I call if I have questions? When are the best times to check my blood glucose? Where to find more information: American Diabetes Association: diabetes.org Academy of Nutrition and Dietetics: eatright.[employer] of Diabetes and Digestive and Kidney Diseases: [employer] Association of Diabetes Care & Education Specialists: diabeteseducator.org Summary It is important to have healthy eating habits because your blood sugar (glucose) levels are greatly affected by what you eat and drink. It is important to use alcohol carefully. A healthy meal plan will help you manage your blood glucose and lower your risk of heart disease. Your health care provider may recommend that you work with a dietitian to make a meal plan that is best for you. This information is not intended to replace advice given to you by your health care provider. Make sure you discuss any questions you have with your health care provider. Document Revised: 06/26/2020 Document Reviewed: 06/26/2020 Elsevier Patient Education  [place].

## 2022-11-17 NOTE — Assessment & Plan Note (Signed)
Stable.  Diet and nutrition discussed.  Continue atorvastatin 40 mg daily. The 10-year ASCVD risk score (Arnett DK, et al., 2019) is: 15.2%   Values used to calculate the score:     Age: 66 years     Sex: Female     Is Non-Hispanic African American: Yes     Diabetic: Yes     Tobacco smoker: No     Systolic Blood Pressure: 757 mmHg     Is BP treated: Yes     HDL Cholesterol: 57 mg/dL     Total Cholesterol: 161 mg/dL

## 2022-11-17 NOTE — Assessment & Plan Note (Signed)
Well-controlled hypertension on Cardizem 240 mg daily. BP Readings from Last 3 Encounters:  11/17/22 122/72  08/17/22 130/70  06/18/22 (!) 147/79  Uncontrolled diabetes with hemoglobin A1c at 10.1. Has not been taking Trulicity and has only been taking insulin glargine 22 units daily. Recommend to continue Farxiga 10 mg daily Restart Trulicity at 1.5 mg weekly and increase insulin glargine to 28 units daily Cardiovascular risk associated with uncontrolled diabetes discussed. Diet and patient discussed. Follow-up in 3 months.

## 2022-11-17 NOTE — Assessment & Plan Note (Signed)
Advised to stay well-hydrated and avoid NSAIDs. Appointment with nephrologist later this month.

## 2022-11-17 NOTE — Progress Notes (Signed)
Nancy [name] 66 y.o.   Chief Complaint  Patient presents with   Follow-up    May need new bp meds    HISTORY OF PRESENT ILLNESS: This is a 66 y.o. female here for 69-monthfollow-up of diabetes and hypertension #1 hypertension on Cardizem 240 mg daily #2 diabetes on daily insulin, weekly Trulicity, and Farxiga 10 mg daily #3 dyslipidemia on atorvastatin 40 mg daily. #4 chronic kidney disease stage IV.  Scheduled to see nephrologist later this month  HPI   Prior to Admission medications   Medication Sig Start Date End Date Taking? Authorizing Provider  ACCU-CHEK FASTCLIX LANCETS MISC Use to check blood sugar up to 3 times a day 10/11/18  Yes BLedell Noss MD  acetaminophen (TYLENOL) 500 MG tablet Take 2 tablets (1,000 mg total) by mouth every 8 (eight) hours as needed. 12/10/21 12/10/22 Yes GDelene Ruffini MD  atorvastatin (LIPITOR) 40 MG tablet TAKE 1 TABLET BY MOUTH EVERY DAY 02/13/22  Yes JVirl Axe MD  Blood Glucose Monitoring Suppl (ACCU-CHEK GUIDE) w/Device KIT 1 each by Does not apply route 3 (three) times daily. Check blood sugar 3 times a day 07/08/20  Yes JVirl Axe MD  Cholecalciferol (VITAMIN D3) 25 MCG (1000 UT) CAPS Take 1 capsule by mouth daily.   Yes [provider]  cyclobenzaprine (FLEXERIL) 10 MG tablet Take 1 tablet (10 mg total) by mouth at bedtime. 10/02/22  Yes JBiagio Borg MD  diclofenac Sodium (VOLTAREN) 1 % GEL Apply 2 g topically 4 (four) times daily. 11/25/20  Yes WMaudie Mercury MD  diclofenac Sodium (VOLTAREN) 1 % GEL Apply 2 g topically 4 (four) times daily. 01/06/22  Yes GDelene Ruffini MD  DILT-XR 240 MG 24 hr capsule Take 240 mg by mouth daily. 03/17/21  Yes [provider]  Dulaglutide (TRULICITY) 1.5 MDX/8.3JASOPN Inject 1.5 mg into the skin once a week. 06/05/21  Yes LIona Beard MD  FARXIGA 10 MG TABS tablet TAKE 1 TABLET BY MOUTH EVERY DAY BEFORE BREAKFAST 09/05/22  Yes Josseline Reddin, MInes Bloomer MD  glucose blood  (ACCU-CHEK GUIDE) test strip Check blood sugar 3 times a day 10/22/21  Yes JVirl Axe MD  insulin glargine (LANTUS) 100 UNIT/ML injection INJECT 32 UNITS INTO THE SKIN EVERY MORNING WHEN YOU WAKE UP 10/04/22  Yes Derisha Funderburke, MInes Bloomer MD  Insulin Syringe-Needle U-100 31G X 15/64" 0.5 ML MISC Use to inject insulin one time a day 07/08/20  Yes JVirl Axe MD  Insulin Syringes, Disposable, U-100 1 ML MISC 2 Syringes by Does not apply route daily. Use to inject insulin into the skin 1 time daily. diag code E11.9. Insulin dependent 07/08/20  Yes JVirl Axe MD  lidocaine (LIDODERM) 5 % PLACE 1 PATCH ONTO THE SKIN EVERY 12 (TWELVE) HOURS. REMOVE & DISCARD PATCH WITHIN 12 HOURS OR AS DIRECTED BY MD 12/26/21 12/26/22 Yes JVirl Axe MD  LORazepam (ATIVAN) 1 MG tablet Sig 1 mg 1 hour before procedure as needed. 04/20/22  Yes Sharlyne Koeneman, MInes Bloomer MD  omeprazole (PRILOSEC) 40 MG capsule TAKE 1 CAPSULE BY MOUTH EVERY DAY 07/28/21  Yes JVirl Axe MD  senna (SENOKOT) 8.6 MG tablet Take 1 tablet (8.6 mg total) by mouth as needed for constipation. 07/08/20  Yes JVirl Axe MD    Allergies  Allergen Reactions   Ace Inhibitors Cough    Persistent dry cough   Amitriptyline Hcl Nausea And Vomiting   Aspirin Other (See Comments)   Latex Itching   Prednisone Nausea And Vomiting  Patient Active Problem List   Diagnosis Date Noted   Stage 4 chronic kidney disease ([place]) 02/09/2022   Hyperlipidemia associated with type 2 diabetes mellitus ([place]) 04/20/2019   Vaginal atrophy 07/22/2018   Morbid obesity ([place]) 08/04/2017   CKD stage 3 due to type 2 diabetes mellitus ([place]) 08/02/2017   GERD (gastroesophageal reflux disease) 01/16/2014   Carpal tunnel syndrome, bilateral    Hypertension associated with diabetes ([place]) 11/09/2006   Controlled diabetes mellitus with retinopathy ([place]) 12/07/2002    Past Medical History:  Diagnosis Date   Carpal tunnel syndrome, bilateral    confirmed on nerve  conduction studies   Chronic hepatitis C ([place])    considered cured 06/2016 post retroviral therapy    Diabetes mellitus type 2, uncontrolled    Diabetic peripheral neuropathy (HCC)    GERD (gastroesophageal reflux disease)    History of endometriosis    History of Helicobacter infection 11/07   Hyperlipidemia    Liver fibrosis 02/17/2016   F2/3     Past Surgical History:  Procedure Laterality Date   ABDOMINAL HYSTERECTOMY     CARPAL TUNNEL RELEASE      Social History   Socioeconomic History   Marital status: Single    Spouse name: Not on file   Number of children: 0   Years of education: 12   Highest education level: Not on file  Occupational History   Occupation: Forensic psychologist: [employer]   Occupation: Disabled  Tobacco Use   Smoking status: Never   Smokeless tobacco: Never  Vaping Use   Vaping Use: Never used  Substance and Sexual Activity   Alcohol use: No    Alcohol/week: 0.0 standard drinks of alcohol   Drug use: No   Sexual activity: Yes    Birth control/protection: None    Comment: 1 committed partner  Other Topics Concern   Not on file  Social History Narrative   Current Social History 08/28/2019        Patient lives alone in a 7th floor apartment with elevator. There are not steps up to the entrance the patient uses.       Patient's method of transportation is via family member (mom or friend).      The highest level of education was high school diploma.      The patient currently disabled.      Identified important Relationships are "My mother"       Pets : None       Interests / Fun: "TV, movies"       Current Stressors: "People, my health"       Religious / Personal Beliefs: "Baptist"       L. Silvano Rusk, RN, BSN       Social Determinants of Health   Financial Resource Strain: Low Risk  (04/24/2022)   Overall Financial Resource Strain (CARDIA)    Difficulty of Paying Living Expenses: Not hard at all  Food Insecurity: No Food  Insecurity (08/25/2022)   Hunger Vital Sign    Worried About Running Out of Food in the Last Year: Never true    [place] in the Last Year: Never true  Transportation Needs: No Transportation Needs (08/25/2022)   PRAPARE - Hydrologist (Medical): No    Lack of Transportation (Non-Medical): No  Physical Activity: Not on file  Stress: No Stress Concern Present (04/24/2022)   [place]  Feeling of Stress : Not at all  Social Connections: Socially Isolated (04/24/2022)   Social Connection and Isolation Panel [NHANES]    Frequency of Communication with Friends and Family: Three times a week    Frequency of Social Gatherings with Friends and Family: Three times a week    Attends Religious Services: Never    Active Member of Clubs or Organizations: No    Attends Archivist Meetings: Never    Marital Status: Divorced  Human resources officer Violence: Not At Risk (04/24/2022)   Humiliation, Afraid, Rape, and Kick questionnaire    Fear of Current or Ex-Partner: No    Emotionally Abused: No    Physically Abused: No    Sexually Abused: No    Family History  Problem Relation Age of Onset   Cancer Mother        lung   Kidney disease Mother    Lung cancer Maternal Uncle    Breast cancer Maternal Grandmother    Liver cancer Maternal Grandfather    Heart disease Brother    Depression Brother    Diabetes Father    Dementia Father    Colon cancer Neg Hx      Review of Systems  Constitutional: Negative.  Negative for chills and fever.  HENT: Negative.  Negative for congestion and sore throat.   Respiratory: Negative.  Negative for cough and shortness of breath.   Cardiovascular: Negative.  Negative for chest pain and palpitations.  Gastrointestinal:  Negative for abdominal pain, diarrhea, nausea and vomiting.  Genitourinary: Negative.  Negative for dysuria and hematuria.  Skin:  Negative.  Negative for rash.  Neurological: Negative.  Negative for dizziness and headaches.  All other systems reviewed and are negative.  Today's Vitals   11/17/22 1310  BP: 122/72  Pulse: 64  Temp: 98.7 F (37.1 C)  TempSrc: Oral  SpO2: 99%  Weight: 156 lb (70.8 kg)  Height: _0  (1.448 m)   Body mass index is 33.76 kg/m.   Physical Exam Vitals reviewed.  Constitutional:      Appearance: Normal appearance.  HENT:     Head: Normocephalic.  Eyes:     Extraocular Movements: Extraocular movements intact.     Pupils: Pupils are equal, round, and reactive to light.  Cardiovascular:     Rate and Rhythm: Normal rate and regular rhythm.     Pulses: Normal pulses.     Heart sounds: Normal heart sounds.  Pulmonary:     Effort: Pulmonary effort is normal.     Breath sounds: Normal breath sounds.  Musculoskeletal:     Cervical back: No tenderness.  Lymphadenopathy:     Cervical: No cervical adenopathy.  Skin:    General: Skin is warm and dry.  Neurological:     General: No focal deficit present.     Mental Status: She is alert and oriented to person, place, and time.  Psychiatric:        Mood and Affect: Mood normal.        Behavior: Behavior normal.    Results for orders placed or performed in visit on 11/17/22 (from the past 24 hour(s))  POCT HgB A1C     Status: Abnormal   Collection Time: 11/17/22  1:56 PM  Result Value Ref Range   Hemoglobin A1C 10.1 (A) 4.0 - 5.6 %   HbA1c POC (<> result, manual entry)     HbA1c, POC (prediabetic range)     HbA1c, POC (controlled diabetic range)  ASSESSMENT & PLAN: A total of 47 minutes was spent with the patient and counseling/coordination of care regarding preparing for this visit, review of most recent office visit notes, review of most recent blood work results including interpretation of today's hemoglobin A1c, cardiovascular risk associated with uncontrolled diabetes, education on nutrition, review of chronic  medical problems and their management, review of all medications and changes made, prognosis, need for nephrology evaluation, documentation, and need for follow-up.  Problem List Items Addressed This Visit       Cardiovascular and Mediastinum   Hypertension associated with diabetes ([place]) - Primary    Well-controlled hypertension on Cardizem 240 mg daily. BP Readings from Last 3 Encounters:  11/17/22 122/72  08/17/22 130/70  06/18/22 (!) 147/79  Uncontrolled diabetes with hemoglobin A1c at 10.1. Has not been taking Trulicity and has only been taking insulin glargine 22 units daily. Recommend to continue Farxiga 10 mg daily Restart Trulicity at 1.5 mg weekly and increase insulin glargine to 28 units daily Cardiovascular risk associated with uncontrolled diabetes discussed. Diet and patient discussed. Follow-up in 3 months.       Relevant Medications   Dulaglutide (TRULICITY) 1.5 RV/6.1BP SOPN   Other Relevant Orders   POCT HgB A1C (Completed)   POCT glycosylated hemoglobin (Hb A1C)     Endocrine   Hyperlipidemia associated with type 2 diabetes mellitus (HCC)    Stable.  Diet and nutrition discussed.  Continue atorvastatin 40 mg daily. The 10-year ASCVD risk score (Arnett DK, et al., 2019) is: 15.2%   Values used to calculate the score:     Age: 21 years     Sex: Female     Is Non-Hispanic African American: Yes     Diabetic: Yes     Tobacco smoker: No     Systolic Blood Pressure: 794 mmHg     Is BP treated: Yes     HDL Cholesterol: 57 mg/dL     Total Cholesterol: 161 mg/dL       Relevant Medications   Dulaglutide (TRULICITY) 1.5 FE/7.6DY SOPN     Genitourinary   Stage 4 chronic kidney disease (HCC)    Advised to stay well-hydrated and avoid NSAIDs. Appointment with nephrologist later this month.      Patient Instructions  Increase insulin glargine to 28 units daily Restart weekly Trulicity Continue Farxiga 10 mg daily Follow-up with kidney doctor as  scheduled Follow-up with me in 3 months.  Diabetes Mellitus and Nutrition, Adult When you have diabetes, or diabetes mellitus, it is very important to have healthy eating habits because your blood sugar (glucose) levels are greatly affected by what you eat and drink. Eating healthy foods in the right amounts, at about the same times every day, can help you: Manage your blood glucose. Lower your risk of heart disease. Improve your blood pressure. Reach or maintain a healthy weight. What can affect my meal plan? Every person with diabetes is different, and each person has different needs for a meal plan. Your health care provider may recommend that you work with a dietitian to make a meal plan that is best for you. Your meal plan may vary depending on factors such as: The calories you need. The medicines you take. Your weight. Your blood glucose, blood pressure, and cholesterol levels. Your activity level. Other health conditions you have, such as heart or kidney disease. How do carbohydrates affect me? Carbohydrates, also called carbs, affect your blood glucose level more than any other type of food. Eating  carbs raises the amount of glucose in your blood. It is important to know how many carbs you can safely have in each meal. This is different for every person. Your dietitian can help you calculate how many carbs you should have at each meal and for each snack. How does alcohol affect me? Alcohol can cause a decrease in blood glucose (hypoglycemia), especially if you use insulin or take certain diabetes medicines by mouth. Hypoglycemia can be a life-threatening condition. Symptoms of hypoglycemia, such as sleepiness, dizziness, and confusion, are similar to symptoms of having too much alcohol. Do not drink alcohol if: Your health care provider tells you not to drink. You are pregnant, may be pregnant, or are planning to become pregnant. If you drink alcohol: Limit how much you have  to: 0-1 drink a day for women. 0-2 drinks a day for men. Know how much alcohol is in your drink. In the [place] one drink equals one 12 oz bottle of beer (355 mL), one 5 oz glass of wine (148 mL), or one 1 oz glass of hard liquor (44 mL). Keep yourself hydrated with water, diet soda, or unsweetened iced tea. Keep in mind that regular soda, juice, and other mixers may contain a lot of sugar and must be counted as carbs. What are tips for following this plan?  Reading food labels Start by checking the serving size on the Nutrition Facts label of packaged foods and drinks. The number of calories and the amount of carbs, fats, and other nutrients listed on the label are based on one serving of the item. Many items contain more than one serving per package. Check the total grams (g) of carbs in one serving. Check the number of grams of saturated fats and trans fats in one serving. Choose foods that have a low amount or none of these fats. Check the number of milligrams (mg) of salt (sodium) in one serving. Most people should limit total sodium intake to less than 2,300 mg per day. Always check the nutrition information of foods labeled as "low-fat" or "nonfat." These foods may be higher in added sugar or refined carbs and should be avoided. Talk to your dietitian to identify your daily goals for nutrients listed on the label. Shopping Avoid buying canned, pre-made, or processed foods. These foods tend to be high in fat, sodium, and added sugar. Shop around the outside edge of the grocery store. This is where you will most often find fresh fruits and vegetables, bulk grains, fresh meats, and fresh dairy products. Cooking Use low-heat cooking methods, such as baking, instead of high-heat cooking methods, such as deep frying. Cook using healthy oils, such as olive, canola, or sunflower oil. Avoid cooking with butter, cream, or high-fat meats. Meal planning Eat meals and snacks regularly, preferably at  the same times every day. Avoid going long periods of time without eating. Eat foods that are high in fiber, such as fresh fruits, vegetables, beans, and whole grains. Eat 4-6 oz (112-168 g) of lean protein each day, such as lean meat, chicken, fish, eggs, or tofu. One ounce (oz) (28 g) of lean protein is equal to: 1 oz (28 g) of meat, chicken, or fish. 1 egg.  cup (62 g) of tofu. Eat some foods each day that contain healthy fats, such as avocado, nuts, seeds, and fish. What foods should I eat? Fruits Berries. Apples. Oranges. Peaches. Apricots. Plums. Grapes. Mangoes. Papayas. Pomegranates. Kiwi. Cherries. Vegetables Leafy greens, including lettuce, spinach, kale, chard, collard  greens, mustard greens, and cabbage. Beets. Cauliflower. Broccoli. Carrots. Green beans. Tomatoes. Peppers. Onions. Cucumbers. Brussels sprouts. Grains Whole grains, such as whole-wheat or whole-grain bread, crackers, tortillas, cereal, and pasta. Unsweetened oatmeal. Quinoa. Brown or wild rice. Meats and other proteins Seafood. Poultry without skin. Lean cuts of poultry and beef. Tofu. Nuts. Seeds. Dairy Low-fat or fat-free dairy products such as milk, yogurt, and cheese. The items listed above may not be a complete list of foods and beverages you can eat and drink. Contact a dietitian for more information. What foods should I avoid? Fruits Fruits canned with syrup. Vegetables Canned vegetables. Frozen vegetables with butter or cream sauce. Grains Refined white flour and flour products such as bread, pasta, snack foods, and cereals. Avoid all processed foods. Meats and other proteins Fatty cuts of meat. Poultry with skin. Breaded or fried meats. Processed meat. Avoid saturated fats. Dairy Full-fat yogurt, cheese, or milk. Beverages Sweetened drinks, such as soda or iced tea. The items listed above may not be a complete list of foods and beverages you should avoid. Contact a dietitian for more  information. Questions to ask a health care provider Do I need to meet with a certified diabetes care and education specialist? Do I need to meet with a dietitian? What number can I call if I have questions? When are the best times to check my blood glucose? Where to find more information: American Diabetes Association: diabetes.org Academy of Nutrition and Dietetics: eatright.[employer] of Diabetes and Digestive and Kidney Diseases: [employer]is Association of Diabetes Care & Education Specialists: diabeteseducator.org Summary It is important to have healthy eating habits because your blood sugar (glucose) levels are greatly affected by what you eat and drink. It is important to use alcohol carefully. A healthy meal plan will help you manage your blood glucose and lower your risk of heart disease. Your health care provider may recommend that you work with a dietitian to make a meal plan that is best for you. This information is not intended to replace advice given to you by your health care provider. Make sure you discuss any questions you have with your health care provider. Document Revised: 06/26/2020 Document Reviewed: 06/26/2020 Elsevier Patient Education  [place], MD [facility]

## 2022-11-25 DIAGNOSIS — N185 Chronic kidney disease, stage 5: Secondary | ICD-10-CM | POA: Diagnosis not present

## 2022-11-25 DIAGNOSIS — N2581 Secondary hyperparathyroidism of renal origin: Secondary | ICD-10-CM | POA: Diagnosis not present

## 2022-11-25 DIAGNOSIS — I120 Hypertensive chronic kidney disease with stage 5 chronic kidney disease or end stage renal disease: Secondary | ICD-10-CM | POA: Diagnosis not present

## 2022-11-25 DIAGNOSIS — E1122 Type 2 diabetes mellitus with diabetic chronic kidney disease: Secondary | ICD-10-CM | POA: Diagnosis not present

## 2022-11-25 DIAGNOSIS — R768 Other specified abnormal immunological findings in serum: Secondary | ICD-10-CM | POA: Diagnosis not present

## 2022-11-25 DIAGNOSIS — R801 Persistent proteinuria, unspecified: Secondary | ICD-10-CM | POA: Diagnosis not present

## 2022-12-21 DIAGNOSIS — N184 Chronic kidney disease, stage 4 (severe): Secondary | ICD-10-CM | POA: Diagnosis not present

## 2022-12-29 ENCOUNTER — Other Ambulatory Visit: Payer: Self-pay | Admitting: Student

## 2022-12-29 ENCOUNTER — Telehealth: Payer: Self-pay | Admitting: Emergency Medicine

## 2022-12-29 DIAGNOSIS — R801 Persistent proteinuria, unspecified: Secondary | ICD-10-CM | POA: Diagnosis not present

## 2022-12-29 DIAGNOSIS — K219 Gastro-esophageal reflux disease without esophagitis: Secondary | ICD-10-CM

## 2022-12-29 DIAGNOSIS — N2581 Secondary hyperparathyroidism of renal origin: Secondary | ICD-10-CM | POA: Diagnosis not present

## 2022-12-29 DIAGNOSIS — N185 Chronic kidney disease, stage 5: Secondary | ICD-10-CM | POA: Diagnosis not present

## 2022-12-29 DIAGNOSIS — I120 Hypertensive chronic kidney disease with stage 5 chronic kidney disease or end stage renal disease: Secondary | ICD-10-CM | POA: Diagnosis not present

## 2022-12-29 DIAGNOSIS — I152 Hypertension secondary to endocrine disorders: Secondary | ICD-10-CM

## 2022-12-29 DIAGNOSIS — E1122 Type 2 diabetes mellitus with diabetic chronic kidney disease: Secondary | ICD-10-CM | POA: Diagnosis not present

## 2022-12-29 MED ORDER — DILT-XR 240 MG PO CP24: 240.0000 mg | ORAL_CAPSULE | Freq: Every day | ORAL | 2 refills | Status: DC

## 2022-12-29 NOTE — Telephone Encounter (Signed)
Caller & Relationship to patient:  Self   Call back number:  [phone]   Date of last office visit:   Date of next office visit:  02/16/2023   Medication(s) to be refilled:  dilt-xr 240 mg        Preferred Pharmacy:  [facility] on [address]

## 2022-12-29 NOTE — Telephone Encounter (Signed)
Okay to refill.  Thanks.

## 2022-12-29 NOTE — Telephone Encounter (Signed)
New medication prescription sent to requested pharmacy

## 2023-01-14 ENCOUNTER — Ambulatory Visit (INDEPENDENT_AMBULATORY_CARE_PROVIDER_SITE_OTHER): Payer: 59 | Admitting: Emergency Medicine

## 2023-01-14 ENCOUNTER — Encounter: Payer: Self-pay | Admitting: Emergency Medicine

## 2023-01-14 VITALS — BP 136/86 | HR 69 | Temp 98.2°F | Ht <= 58 in | Wt 158.4 lb

## 2023-01-14 DIAGNOSIS — H6123 Impacted cerumen, bilateral: Secondary | ICD-10-CM | POA: Insufficient documentation

## 2023-01-14 NOTE — Progress Notes (Signed)
Nancy [name] 67 y.o.   Chief Complaint  Patient presents with   Follow-up    Ears clogged, no other concerns     HISTORY OF PRESENT ILLNESS: Acute problem visit today. This is a 67 y.o. female complaining of clogged ears.  Unable to hear.  HPI   Prior to Admission medications   Medication Sig Start Date End Date Taking? Authorizing Provider  ACCU-CHEK FASTCLIX LANCETS MISC Use to check blood sugar up to 3 times a day 10/11/18  Yes Ledell Noss, MD  atorvastatin (LIPITOR) 40 MG tablet TAKE 1 TABLET BY MOUTH EVERY DAY 02/13/22  Yes Virl Axe, MD  Blood Glucose Monitoring Suppl (ACCU-CHEK GUIDE) w/Device KIT 1 each by Does not apply route 3 (three) times daily. Check blood sugar 3 times a day 07/08/20  Yes Virl Axe, MD  Cholecalciferol (VITAMIN D3) 25 MCG (1000 UT) CAPS Take 1 capsule by mouth daily.   Yes [provider]  cyclobenzaprine (FLEXERIL) 10 MG tablet Take 1 tablet (10 mg total) by mouth at bedtime. 10/02/22  Yes Biagio Borg, MD  diclofenac Sodium (VOLTAREN) 1 % GEL Apply 2 g topically 4 (four) times daily. 11/25/20  Yes Maudie Mercury, MD  diclofenac Sodium (VOLTAREN) 1 % GEL Apply 2 g topically 4 (four) times daily. 01/06/22  Yes Delene Ruffini, MD  DILT-XR 240 MG 24 hr capsule Take 1 capsule (240 mg total) by mouth daily. 12/29/22  Yes Bracken Moffa, Ines Bloomer, MD  Dulaglutide (TRULICITY) 1.5 [place]/7.0WC SOPN Inject 1.5 mg into the skin once a week. 11/17/22  Yes Jenna Ardoin, Ines Bloomer, MD  FARXIGA 10 MG TABS tablet TAKE 1 TABLET BY MOUTH EVERY DAY BEFORE BREAKFAST 09/05/22  Yes Breauna Mazzeo, [place], MD  glucose blood (ACCU-CHEK GUIDE) test strip Check blood sugar 3 times a day 10/22/21  Yes Virl Axe, MD  insulin glargine (LANTUS) 100 UNIT/ML injection INJECT 32 UNITS INTO THE SKIN EVERY MORNING WHEN YOU WAKE UP 10/04/22  Yes Mattheo Swindle, Ines Bloomer, MD  Insulin Syringe-Needle U-100 31G X 15/64" 0.5 ML MISC Use to inject insulin one time a day 07/08/20  Yes  Virl Axe, MD  Insulin Syringes, Disposable, U-100 1 ML MISC 2 Syringes by Does not apply route daily. Use to inject insulin into the skin 1 time daily. diag code E11.9. Insulin dependent 07/08/20  Yes Virl Axe, MD  LORazepam (ATIVAN) 1 MG tablet Sig 1 mg 1 hour before procedure as needed. 04/20/22  Yes Adi Doro, Ines Bloomer, MD  omeprazole (PRILOSEC) 40 MG capsule TAKE 1 CAPSULE BY MOUTH EVERY DAY 07/28/21  Yes Virl Axe, MD  senna (SENOKOT) 8.6 MG tablet Take 1 tablet (8.6 mg total) by mouth as needed for constipation. 07/08/20  Yes Virl Axe, MD    Allergies  Allergen Reactions   Ace Inhibitors Cough    Persistent dry cough   Amitriptyline Hcl Nausea And Vomiting   Aspirin Other (See Comments)   Latex Itching   Prednisone Nausea And Vomiting    Patient Active Problem List   Diagnosis Date Noted   Stage 4 chronic kidney disease ([place]) 02/09/2022   Hyperlipidemia associated with type 2 diabetes mellitus ([place]) 04/20/2019   Vaginal atrophy 07/22/2018   Morbid obesity ([place]) 08/04/2017   CKD stage 3 due to type 2 diabetes mellitus ([place]) 08/02/2017   GERD (gastroesophageal reflux disease) 01/16/2014   Carpal tunnel syndrome, bilateral    Hypertension associated with diabetes ([place]) 11/09/2006   Controlled diabetes mellitus with retinopathy ([place]) 12/07/2002    Past  Medical History:  Diagnosis Date   Carpal tunnel syndrome, bilateral    confirmed on nerve conduction studies   Chronic hepatitis C ([place])    considered cured 06/2016 post retroviral therapy    Diabetes mellitus type 2, uncontrolled    Diabetic peripheral neuropathy ([facility])    GERD (gastroesophageal reflux disease)    History of endometriosis    History of Helicobacter infection 11/07   Hyperlipidemia    Liver fibrosis 02/17/2016   F2/3     Past Surgical History:  Procedure Laterality Date   ABDOMINAL HYSTERECTOMY     CARPAL TUNNEL RELEASE      Social History   Socioeconomic History   Marital  status: Single    Spouse name: Not on file   Number of children: 0   Years of education: 12   Highest education level: Not on file  Occupational History   Occupation: Forensic psychologist: [employer]   Occupation: Disabled  Tobacco Use   Smoking status: Never   Smokeless tobacco: Never  Vaping Use   Vaping Use: Never used  Substance and Sexual Activity   Alcohol use: No    Alcohol/week: 0.0 standard drinks of alcohol   Drug use: No   Sexual activity: Yes    Birth control/protection: None    Comment: 1 committed partner  Other Topics Concern   Not on file  Social History Narrative   Current Social History 08/28/2019        Patient lives alone in a 7th floor apartment with elevator. There are not steps up to the entrance the patient uses.       Patient's method of transportation is via family member (mom or friend).      The highest level of education was high school diploma.      The patient currently disabled.      Identified important Relationships are "My mother"       Pets : None       Interests / Fun: "TV, movies"       Current Stressors: "People, my health"       Religious / Personal Beliefs: "Baptist"       L. Silvano Rusk, RN, BSN       Social Determinants of Health   Financial Resource Strain: Low Risk  (04/24/2022)   Overall Financial Resource Strain (CARDIA)    Difficulty of Paying Living Expenses: Not hard at all  Food Insecurity: No Food Insecurity (08/25/2022)   Hunger Vital Sign    Worried About Running Out of Food in the Last Year: Never true    [place] in the Last Year: Never true  Transportation Needs: No Transportation Needs (08/25/2022)   PRAPARE - Hydrologist (Medical): No    Lack of Transportation (Non-Medical): No  Physical Activity: Not on file  Stress: No Stress Concern Present (04/24/2022)   [place]    Feeling of Stress : Not at all   Social Connections: Socially Isolated (04/24/2022)   Social Connection and Isolation Panel [NHANES]    Frequency of Communication with Friends and Family: Three times a week    Frequency of Social Gatherings with Friends and Family: Three times a week    Attends Religious Services: Never    Active Member of Clubs or Organizations: No    Attends Archivist Meetings: Never    Marital Status: Divorced  Human resources officer Violence:  Not At Risk (04/24/2022)   Humiliation, Afraid, Rape, and Kick questionnaire    Fear of Current or Ex-Partner: No    Emotionally Abused: No    Physically Abused: No    Sexually Abused: No    Family History  Problem Relation Age of Onset   Cancer Mother        lung   Kidney disease Mother    Lung cancer Maternal Uncle    Breast cancer Maternal Grandmother    Liver cancer Maternal Grandfather    Heart disease Brother    Depression Brother    Diabetes Father    Dementia Father    Colon cancer Neg Hx      Review of Systems  Constitutional: Negative.  Negative for chills and fever.  HENT:  Positive for hearing loss. Negative for congestion and sore throat.   Respiratory: Negative.  Negative for cough and shortness of breath.   Cardiovascular: Negative.  Negative for chest pain and palpitations.  Gastrointestinal: Negative.  Negative for abdominal pain, diarrhea, nausea and vomiting.  Neurological: Negative.  Negative for dizziness and headaches.  All other systems reviewed and are negative.  Today's Vitals   01/14/23 1412  BP: 136/86  Pulse: 69  Temp: 98.2 F (36.8 C)  TempSrc: Oral  SpO2: 97%  Weight: 158 lb 6 oz (71.8 kg)  Height: '4\' 9"'$  (1.448 m)   Body mass index is 34.27 kg/m.   Physical Exam Constitutional:      Appearance: Normal appearance.  HENT:     Head: Normocephalic.     Right Ear: There is impacted cerumen.     Left Ear: There is impacted cerumen.  Eyes:     Extraocular Movements: Extraocular movements intact.   Cardiovascular:     Rate and Rhythm: Normal rate.  Pulmonary:     Effort: Pulmonary effort is normal.  Skin:    General: Skin is warm and dry.  Neurological:     Mental Status: She is alert and oriented to person, place, and time.  Psychiatric:        Mood and Affect: Mood normal.        Behavior: Behavior normal.    Ceruminosis is noted.  After obtaining verbal consent ear irrigation is performed.  However, unable to remove wax.  Tolerated procedure well.  No complications.  Instructions for home care irrigation and need for follow-up with ENT given.   ASSESSMENT & PLAN: Problem List Items Addressed This Visit       Nervous and Auditory   Bilateral hearing loss due to cerumen impaction - Primary    Suboptimal irrigation.  Unable to remove all wax. Recommend ENT evaluation.  Referral placed today. Recommend to continue irrigation at home with over-the-counter earwax irrigation kits.      Other Visit Diagnoses     Impacted cerumen of both ears       Relevant Orders   Ambulatory referral to ENT   Ear Lavage      Patient Instructions  Earwax Buildup, Adult The ears produce a substance called earwax that helps keep bacteria out of the ear and protects the skin in the ear canal. Occasionally, earwax can build up in the ear and cause discomfort or hearing loss. What are the causes? This condition is caused by a buildup of earwax. Ear canals are self-cleaning. Ear wax is made in the outer part of the ear canal and generally falls out in small amounts over time. When the self-cleaning mechanism is  not working, earwax builds up and can cause decreased hearing and discomfort. Attempting to clean ears with cotton swabs can push the earwax deep into the ear canal and cause decreased hearing and pain. What increases the risk? This condition is more likely to develop in people who: Clean their ears often with cotton swabs. Pick at their ears. Use earplugs or in-ear headphones  often, or wear hearing aids. The following factors may also make you more likely to develop this condition: Being female. Being of older age. Naturally producing more earwax. Having narrow ear canals. Having earwax that is overly thick or sticky. Having excess hair in the ear canal. Having eczema. Being dehydrated. What are the signs or symptoms? Symptoms of this condition include: Reduced or muffled hearing. A feeling of fullness in the ear or feeling that the ear is plugged. Fluid coming from the ear. Ear pain or an itchy ear. Ringing in the ear. Coughing. Balance problems. An obvious piece of earwax that can be seen inside the ear canal. How is this diagnosed? This condition may be diagnosed based on: Your symptoms. Your medical history. An ear exam. During the exam, your health care provider will look into your ear with an instrument called an otoscope. You may have tests, including a hearing test. How is this treated? This condition may be treated by: Using ear drops to soften the earwax. Having the earwax removed by a health care provider. The health care provider may: Flush the ear with water. Use an instrument that has a loop on the end (curette). Use a suction device. Having surgery to remove the wax buildup. This may be done in severe cases. Follow these instructions at home:  Take over-the-counter and prescription medicines only as told by your health care provider. Do not put any objects, including cotton swabs, into your ear. You can clean the opening of your ear canal with a washcloth or facial tissue. Follow instructions from your health care provider about cleaning your ears. Do not overclean your ears. Drink enough fluid to keep your urine pale yellow. This will help to thin the earwax. Keep all follow-up visits as told. If earwax builds up in your ears often or if you use hearing aids, consider seeing your health care provider for routine, preventive ear  cleanings. Ask your health care provider how often you should schedule your cleanings. If you have hearing aids, clean them according to instructions from the manufacturer and your health care provider. Contact a health care provider if: You have ear pain. You develop a fever. You have pus or other fluid coming from your ear. You have hearing loss. You have ringing in your ears that does not go away. You feel like the room is spinning (vertigo). Your symptoms do not improve with treatment. Get help right away if: You have bleeding from the affected ear. You have severe ear pain. Summary Earwax can build up in the ear and cause discomfort or hearing loss. The most common symptoms of this condition include reduced or muffled hearing, a feeling of fullness in the ear, or feeling that the ear is plugged. This condition may be diagnosed based on your symptoms, your medical history, and an ear exam. This condition may be treated by using ear drops to soften the earwax or by having the earwax removed by a health care provider. Do not put any objects, including cotton swabs, into your ear. You can clean the opening of your ear canal with a washcloth or  facial tissue. This information is not intended to replace advice given to you by your health care provider. Make sure you discuss any questions you have with your health care provider. Document Revised: 03/12/2020 Document Reviewed: 03/12/2020 Elsevier Patient Education  [place], MD [facility]

## 2023-01-14 NOTE — Patient Instructions (Signed)

## 2023-01-14 NOTE — Progress Notes (Signed)
PRE-PROCEDURE EXAM: Bil TM cannot be visualized due to total occlusion/impaction of the ear canal.  PROCEDURE INDICATION: remove wax to visualize ear drum & relieve discomfort  CONSENT:  Verbal     PROCEDURE NOTE:     Bil EAR:  I used warm water irrigation under direct visualization with the otoscope to free the wax bolus from the ear canal.    POST- PROCEDURE EXAM:  Bil TMs was unsuccessfully visualized. Patient still had some build up of wax. Notified provider. Referral to ENT was placed    The patient tolerated the procedure well.

## 2023-01-14 NOTE — Assessment & Plan Note (Signed)
Suboptimal irrigation.  Unable to remove all wax. Recommend ENT evaluation.  Referral placed today. Recommend to continue irrigation at home with over-the-counter earwax irrigation kits.

## 2023-02-08 DIAGNOSIS — I120 Hypertensive chronic kidney disease with stage 5 chronic kidney disease or end stage renal disease: Secondary | ICD-10-CM | POA: Diagnosis not present

## 2023-02-08 DIAGNOSIS — N2581 Secondary hyperparathyroidism of renal origin: Secondary | ICD-10-CM | POA: Diagnosis not present

## 2023-02-08 DIAGNOSIS — N185 Chronic kidney disease, stage 5: Secondary | ICD-10-CM | POA: Diagnosis not present

## 2023-02-08 DIAGNOSIS — E1122 Type 2 diabetes mellitus with diabetic chronic kidney disease: Secondary | ICD-10-CM | POA: Diagnosis not present

## 2023-02-08 DIAGNOSIS — R801 Persistent proteinuria, unspecified: Secondary | ICD-10-CM | POA: Diagnosis not present

## 2023-02-09 LAB — LAB REPORT - SCANNED: EGFR: 12

## 2023-02-16 ENCOUNTER — Encounter: Payer: Self-pay | Admitting: Emergency Medicine

## 2023-02-16 ENCOUNTER — Ambulatory Visit (INDEPENDENT_AMBULATORY_CARE_PROVIDER_SITE_OTHER): Payer: 59 | Admitting: Emergency Medicine

## 2023-02-16 VITALS — BP 142/96 | HR 75 | Temp 98.0°F | Ht <= 58 in | Wt 152.1 lb

## 2023-02-16 DIAGNOSIS — I152 Hypertension secondary to endocrine disorders: Secondary | ICD-10-CM | POA: Diagnosis not present

## 2023-02-16 DIAGNOSIS — E785 Hyperlipidemia, unspecified: Secondary | ICD-10-CM

## 2023-02-16 DIAGNOSIS — Z13820 Encounter for screening for osteoporosis: Secondary | ICD-10-CM | POA: Diagnosis not present

## 2023-02-16 DIAGNOSIS — E1159 Type 2 diabetes mellitus with other circulatory complications: Secondary | ICD-10-CM

## 2023-02-16 DIAGNOSIS — K219 Gastro-esophageal reflux disease without esophagitis: Secondary | ICD-10-CM

## 2023-02-16 DIAGNOSIS — Z794 Long term (current) use of insulin: Secondary | ICD-10-CM | POA: Diagnosis not present

## 2023-02-16 DIAGNOSIS — E11319 Type 2 diabetes mellitus with unspecified diabetic retinopathy without macular edema: Secondary | ICD-10-CM | POA: Diagnosis not present

## 2023-02-16 DIAGNOSIS — N184 Chronic kidney disease, stage 4 (severe): Secondary | ICD-10-CM | POA: Diagnosis not present

## 2023-02-16 DIAGNOSIS — E1169 Type 2 diabetes mellitus with other specified complication: Secondary | ICD-10-CM

## 2023-02-16 LAB — POCT GLYCOSYLATED HEMOGLOBIN (HGB A1C): Hemoglobin A1C: 9.4 % — AB (ref 4.0–5.6)

## 2023-02-16 MED ORDER — INSULIN GLARGINE 100 UNIT/ML ~~LOC~~ SOLN: SUBCUTANEOUS | 3 refills | Status: DC

## 2023-02-16 MED ORDER — DAPAGLIFLOZIN PROPANEDIOL 10 MG PO TABS: ORAL_TABLET | ORAL | 3 refills | Status: DC

## 2023-02-16 NOTE — Progress Notes (Signed)
Nancy [name] 67 y.o.   Chief Complaint  Patient presents with   Medical Management of Chronic Issues    3 mnth f/u appt, patient has been having muscle spasms    HISTORY OF PRESENT ILLNESS: This is a 66 y.o. female here for 79-monthfollow-up of chronic medical conditions. Seen recently by kidney doctors Also complaining of occasional back muscle spasms No other complaint or medical concerns today.  HPI   Prior to Admission medications   Medication Sig Start Date End Date Taking? Authorizing Provider  ACCU-CHEK FASTCLIX LANCETS MISC Use to check blood sugar up to 3 times a day 10/11/18   BLedell Noss MD  atorvastatin (LIPITOR) 40 MG tablet TAKE 1 TABLET BY MOUTH EVERY DAY 02/13/22   JVirl Axe MD  Blood Glucose Monitoring Suppl (ACCU-CHEK GUIDE) w/Device KIT 1 each by Does not apply route 3 (three) times daily. Check blood sugar 3 times a day 07/08/20   JVirl Axe MD  Cholecalciferol (VITAMIN D3) 25 MCG (1000 UT) CAPS Take 1 capsule by mouth daily.    [provider]  cyclobenzaprine (FLEXERIL) 10 MG tablet Take 1 tablet (10 mg total) by mouth at bedtime. 10/02/22   JBiagio Borg MD  diclofenac Sodium (VOLTAREN) 1 % GEL Apply 2 g topically 4 (four) times daily. 11/25/20   WMaudie Mercury MD  diclofenac Sodium (VOLTAREN) 1 % GEL Apply 2 g topically 4 (four) times daily. 01/06/22   GDelene Ruffini MD  DILT-XR 240 MG 24 hr capsule Take 1 capsule (240 mg total) by mouth daily. 12/29/22   SHorald Pollen MD  Dulaglutide (TRULICITY) 1.5 M0000000SOPN Inject 1.5 mg into the skin once a week. 11/17/22   SHorald Pollen MD  FARXIGA 10 MG TABS tablet TAKE 1 TABLET BY MOUTH EVERY DAY BEFORE BREAKFAST 09/05/22   SHorald Pollen MD  glucose blood (ACCU-CHEK GUIDE) test strip Check blood sugar 3 times a day 10/22/21   JVirl Axe MD  insulin glargine (LANTUS) 100 UNIT/ML injection INJECT 32 UNITS INTO THE SKIN EVERY MORNING WHEN YOU WAKE UP 10/04/22    SHorald Pollen MD  Insulin Syringe-Needle U-100 31G X 15/64" 0.5 ML MISC Use to inject insulin one time a day 07/08/20   JVirl Axe MD  Insulin Syringes, Disposable, U-100 1 ML MISC 2 Syringes by Does not apply route daily. Use to inject insulin into the skin 1 time daily. diag code E11.9. Insulin dependent 07/08/20   JVirl Axe MD  LORazepam (ATIVAN) 1 MG tablet Sig 1 mg 1 hour before procedure as needed. 04/20/22   SHorald Pollen MD  omeprazole (PRILOSEC) 40 MG capsule TAKE 1 CAPSULE BY MOUTH EVERY DAY 07/28/21   JVirl Axe MD  senna (SENOKOT) 8.6 MG tablet Take 1 tablet (8.6 mg total) by mouth as needed for constipation. 07/08/20   JVirl Axe MD    Allergies  Allergen Reactions   Ace Inhibitors Cough    Persistent dry cough   Amitriptyline Hcl Nausea And Vomiting   Aspirin Other (See Comments)   Latex Itching   Prednisone Nausea And Vomiting    Patient Active Problem List   Diagnosis Date Noted   Bilateral hearing loss due to cerumen impaction 01/14/2023   Stage 4 chronic kidney disease ([place] 02/09/2022   Hyperlipidemia associated with type 2 diabetes mellitus ([place] 04/20/2019   Vaginal atrophy 07/22/2018   Morbid obesity ([place] 08/04/2017   CKD stage 3 due to type 2 diabetes mellitus ([place] 08/02/2017  GERD (gastroesophageal reflux disease) 01/16/2014   Carpal tunnel syndrome, bilateral    Hypertension associated with diabetes ([place]) 11/09/2006   Controlled diabetes mellitus with retinopathy ([place]) 12/07/2002    Past Medical History:  Diagnosis Date   Carpal tunnel syndrome, bilateral    confirmed on nerve conduction studies   Chronic hepatitis C ([place])    considered cured 06/2016 post retroviral therapy    Diabetes mellitus type 2, uncontrolled    Diabetic peripheral neuropathy (HCC)    GERD (gastroesophageal reflux disease)    History of endometriosis    History of Helicobacter infection 11/07   Hyperlipidemia    Liver fibrosis 02/17/2016    F2/3     Past Surgical History:  Procedure Laterality Date   ABDOMINAL HYSTERECTOMY     CARPAL TUNNEL RELEASE      Social History   Socioeconomic History   Marital status: Single    Spouse name: Not on file   Number of children: 0   Years of education: 12   Highest education level: Not on file  Occupational History   Occupation: Forensic psychologist: [employer]   Occupation: Disabled  Tobacco Use   Smoking status: Never   Smokeless tobacco: Never  Vaping Use   Vaping Use: Never used  Substance and Sexual Activity   Alcohol use: No    Alcohol/week: 0.0 standard drinks of alcohol   Drug use: No   Sexual activity: Yes    Birth control/protection: None    Comment: 1 committed partner  Other Topics Concern   Not on file  Social History Narrative   Current Social History 08/28/2019        Patient lives alone in a 7th floor apartment with elevator. There are not steps up to the entrance the patient uses.       Patient's method of transportation is via family member (mom or friend).      The highest level of education was high school diploma.      The patient currently disabled.      Identified important Relationships are "My mother"       Pets : None       Interests / Fun: "TV, movies"       Current Stressors: "People, my health"       Religious / Personal Beliefs: "Baptist"       L. Silvano Rusk, RN, BSN       Social Determinants of Health   Financial Resource Strain: Low Risk  (04/24/2022)   Overall Financial Resource Strain (CARDIA)    Difficulty of Paying Living Expenses: Not hard at all  Food Insecurity: No Food Insecurity (08/25/2022)   Hunger Vital Sign    Worried About Running Out of Food in the Last Year: Never true    [place] in the Last Year: Never true  Transportation Needs: No Transportation Needs (08/25/2022)   PRAPARE - Hydrologist (Medical): No    Lack of Transportation (Non-Medical): No  Physical Activity: Not  on file  Stress: No Stress Concern Present (04/24/2022)   [place]    Feeling of Stress : Not at all  Social Connections: Socially Isolated (04/24/2022)   Social Connection and Isolation Panel [NHANES]    Frequency of Communication with Friends and Family: Three times a week    Frequency of Social Gatherings with Friends and Family: Three times a week  Attends Religious Services: Never    Active Member of Clubs or Organizations: No    Attends Archivist Meetings: Never    Marital Status: Divorced  Human resources officer Violence: Not At Risk (04/24/2022)   Humiliation, Afraid, Rape, and Kick questionnaire    Fear of Current or Ex-Partner: No    Emotionally Abused: No    Physically Abused: No    Sexually Abused: No    Family History  Problem Relation Age of Onset   Cancer Mother        lung   Kidney disease Mother    Lung cancer Maternal Uncle    Breast cancer Maternal Grandmother    Liver cancer Maternal Grandfather    Heart disease Brother    Depression Brother    Diabetes Father    Dementia Father    Colon cancer Neg Hx      Review of Systems  Constitutional: Negative.  Negative for chills and fever.  HENT: Negative.  Negative for congestion and sore throat.   Respiratory: Negative.  Negative for cough and shortness of breath.   Cardiovascular: Negative.  Negative for chest pain.  Gastrointestinal:  Negative for abdominal pain, nausea and vomiting.  Skin: Negative.  Negative for rash.  Neurological: Negative.  Negative for dizziness and headaches.    Today's Vitals   02/16/23 1316 02/16/23 1359  BP: (!) 146/98 (!) 142/96  Pulse: 75   Temp: 98 F (36.7 C)   TempSrc: Oral   SpO2: 100%   Weight: 152 lb 2 oz (69 kg)   Height: '4\' 9"'$  (1.448 m)    Body mass index is 32.92 kg/m.   Physical Exam Vitals reviewed.  Constitutional:      Appearance: Normal appearance.  HENT:     Head:  Normocephalic.  Eyes:     Extraocular Movements: Extraocular movements intact.  Cardiovascular:     Rate and Rhythm: Normal rate.  Pulmonary:     Effort: Pulmonary effort is normal.  Skin:    General: Skin is warm and dry.     Capillary Refill: Capillary refill takes less than 2 seconds.  Neurological:     Mental Status: She is alert and oriented to person, place, and time.  Psychiatric:        Mood and Affect: Mood normal.        Behavior: Behavior normal.    Results for orders placed or performed in visit on 02/16/23 (from the past 24 hour(s))  POCT glycosylated hemoglobin (Hb A1C)     Status: Abnormal   Collection Time: 02/16/23  3:59 PM  Result Value Ref Range   Hemoglobin A1C 9.4 (A) 4.0 - 5.6 %   HbA1c POC (<> result, manual entry)     HbA1c, POC (prediabetic range)     HbA1c, POC (controlled diabetic range)       ASSESSMENT & PLAN: A total of 45 minutes was spent with the patient and counseling/coordination of care regarding preparing for this visit, review of most recent office visit notes, review of multiple chronic medical conditions and treatment, review of all medications, education on nutrition, cardiovascular risk associated with uncontrolled diabetes, prognosis, documentation and need for follow-up.   Problem List Items Addressed This Visit       Cardiovascular and Mediastinum   Hypertension associated with diabetes ([place]) - Primary    Elevated blood pressure reading in the office but normal readings at home. Continue diltiazem 240 mg daily BP Readings from Last 3 Encounters:  02/16/23 (!) 146/98  01/14/23 136/86  11/17/22 122/72  Hemoglobin A1c not at goal at 9.4. Increase insulin lispro to 25 units daily Continue weekly Trulicity at 1.5 mg IM daily Farxiga 10 mg Cardiovascular risks associated with hypertension and diabetes discussed Diet and nutrition discussed Follow-up in 3 months.       Relevant Medications   dapagliflozin propanediol (FARXIGA)  10 MG TABS tablet   insulin glargine (LANTUS) 100 UNIT/ML injection     Digestive   GERD (gastroesophageal reflux disease) (Chronic)    Well-controlled and asymptomatic.  Continues Prilosec 40 mg daily as needed        Endocrine   Controlled diabetes mellitus with retinopathy (HCC)   Relevant Medications   dapagliflozin propanediol (FARXIGA) 10 MG TABS tablet   insulin glargine (LANTUS) 100 UNIT/ML injection   Hyperlipidemia associated with type 2 diabetes mellitus (HCC)    Chronic stable condition. Continue atorvastatin 40 mg daily. The 10-year ASCVD risk score (Arnett DK, et al., 2019) is: 22.6%   Values used to calculate the score:     Age: 37 years     Sex: Female     Is Non-Hispanic African American: Yes     Diabetic: Yes     Tobacco smoker: No     Systolic Blood Pressure: 123456 mmHg     Is BP treated: Yes     HDL Cholesterol: 57 mg/dL     Total Cholesterol: 161 mg/dL       Relevant Medications   dapagliflozin propanediol (FARXIGA) 10 MG TABS tablet   insulin glargine (LANTUS) 100 UNIT/ML injection     Genitourinary   Stage 4 chronic kidney disease (HCC)    Approaching stage V. Scheduled for AV access Follows up with nephrology on a regular basis Last GFR 12 02/08/2023      Other Visit Diagnoses     Type 2 diabetes mellitus with other specified complication, with long-term current use of insulin (HCC)       Relevant Medications   dapagliflozin propanediol (FARXIGA) 10 MG TABS tablet   insulin glargine (LANTUS) 100 UNIT/ML injection   Other Relevant Orders   POCT glycosylated hemoglobin (Hb A1C) (Completed)   Osteoporosis screening       Relevant Orders   DG Bone Density      Patient Instructions  Diabetes Mellitus and Nutrition, Adult When you have diabetes, or diabetes mellitus, it is very important to have healthy eating habits because your blood sugar (glucose) levels are greatly affected by what you eat and drink. Eating healthy foods in the right  amounts, at about the same times every day, can help you: Manage your blood glucose. Lower your risk of heart disease. Improve your blood pressure. Reach or maintain a healthy weight. What can affect my meal plan? Every person with diabetes is different, and each person has different needs for a meal plan. Your health care provider may recommend that you work with a dietitian to make a meal plan that is best for you. Your meal plan may vary depending on factors such as: The calories you need. The medicines you take. Your weight. Your blood glucose, blood pressure, and cholesterol levels. Your activity level. Other health conditions you have, such as heart or kidney disease. How do carbohydrates affect me? Carbohydrates, also called carbs, affect your blood glucose level more than any other type of food. Eating carbs raises the amount of glucose in your blood. It is important to know how many carbs you can  safely have in each meal. This is different for every person. Your dietitian can help you calculate how many carbs you should have at each meal and for each snack. How does alcohol affect me? Alcohol can cause a decrease in blood glucose (hypoglycemia), especially if you use insulin or take certain diabetes medicines by mouth. Hypoglycemia can be a life-threatening condition. Symptoms of hypoglycemia, such as sleepiness, dizziness, and confusion, are similar to symptoms of having too much alcohol. Do not drink alcohol if: Your health care provider tells you not to drink. You are pregnant, may be pregnant, or are planning to become pregnant. If you drink alcohol: Limit how much you have to: 0-1 drink a day for women. 0-2 drinks a day for men. Know how much alcohol is in your drink. In the [place] one drink equals one 12 oz bottle of beer (355 mL), one 5 oz glass of wine (148 mL), or one 1 oz glass of hard liquor (44 mL). Keep yourself hydrated with water, diet soda, or unsweetened iced tea.  Keep in mind that regular soda, juice, and other mixers may contain a lot of sugar and must be counted as carbs. What are tips for following this plan?  Reading food labels Start by checking the serving size on the Nutrition Facts label of packaged foods and drinks. The number of calories and the amount of carbs, fats, and other nutrients listed on the label are based on one serving of the item. Many items contain more than one serving per package. Check the total grams (g) of carbs in one serving. Check the number of grams of saturated fats and trans fats in one serving. Choose foods that have a low amount or none of these fats. Check the number of milligrams (mg) of salt (sodium) in one serving. Most people should limit total sodium intake to less than 2,300 mg per day. Always check the nutrition information of foods labeled as "low-fat" or "nonfat." These foods may be higher in added sugar or refined carbs and should be avoided. Talk to your dietitian to identify your daily goals for nutrients listed on the label. Shopping Avoid buying canned, pre-made, or processed foods. These foods tend to be high in fat, sodium, and added sugar. Shop around the outside edge of the grocery store. This is where you will most often find fresh fruits and vegetables, bulk grains, fresh meats, and fresh dairy products. Cooking Use low-heat cooking methods, such as baking, instead of high-heat cooking methods, such as deep frying. Cook using healthy oils, such as olive, canola, or sunflower oil. Avoid cooking with butter, cream, or high-fat meats. Meal planning Eat meals and snacks regularly, preferably at the same times every day. Avoid going long periods of time without eating. Eat foods that are high in fiber, such as fresh fruits, vegetables, beans, and whole grains. Eat 4-6 oz (112-168 g) of lean protein each day, such as lean meat, chicken, fish, eggs, or tofu. One ounce (oz) (28 g) of lean protein is equal  to: 1 oz (28 g) of meat, chicken, or fish. 1 egg.  cup (62 g) of tofu. Eat some foods each day that contain healthy fats, such as avocado, nuts, seeds, and fish. What foods should I eat? Fruits Berries. Apples. Oranges. Peaches. Apricots. Plums. Grapes. Mangoes. Papayas. Pomegranates. Kiwi. Cherries. Vegetables Leafy greens, including lettuce, spinach, kale, chard, collard greens, mustard greens, and cabbage. Beets. Cauliflower. Broccoli. Carrots. Green beans. Tomatoes. Peppers. Onions. Cucumbers. Brussels sprouts. Grains Whole  grains, such as whole-wheat or whole-grain bread, crackers, tortillas, cereal, and pasta. Unsweetened oatmeal. Quinoa. Brown or wild rice. Meats and other proteins Seafood. Poultry without skin. Lean cuts of poultry and beef. Tofu. Nuts. Seeds. Dairy Low-fat or fat-free dairy products such as milk, yogurt, and cheese. The items listed above may not be a complete list of foods and beverages you can eat and drink. Contact a dietitian for more information. What foods should I avoid? Fruits Fruits canned with syrup. Vegetables Canned vegetables. Frozen vegetables with butter or cream sauce. Grains Refined white flour and flour products such as bread, pasta, snack foods, and cereals. Avoid all processed foods. Meats and other proteins Fatty cuts of meat. Poultry with skin. Breaded or fried meats. Processed meat. Avoid saturated fats. Dairy Full-fat yogurt, cheese, or milk. Beverages Sweetened drinks, such as soda or iced tea. The items listed above may not be a complete list of foods and beverages you should avoid. Contact a dietitian for more information. Questions to ask a health care provider Do I need to meet with a certified diabetes care and education specialist? Do I need to meet with a dietitian? What number can I call if I have questions? When are the best times to check my blood glucose? Where to find more information: American Diabetes Association:  diabetes.org Academy of Nutrition and Dietetics: eatright.[employer] of Diabetes and Digestive and Kidney Diseases: [employer]is Association of Diabetes Care & Education Specialists: diabeteseducator.org Summary It is important to have healthy eating habits because your blood sugar (glucose) levels are greatly affected by what you eat and drink. It is important to use alcohol carefully. A healthy meal plan will help you manage your blood glucose and lower your risk of heart disease. Your health care provider may recommend that you work with a dietitian to make a meal plan that is best for you. This information is not intended to replace advice given to you by your health care provider. Make sure you discuss any questions you have with your health care provider. Document Revised: 06/26/2020 Document Reviewed: 06/26/2020 Elsevier Patient Education  [place], MD [facility]

## 2023-02-16 NOTE — Assessment & Plan Note (Signed)
Well-controlled and asymptomatic.  Continues Prilosec 40 mg daily as needed

## 2023-02-16 NOTE — Assessment & Plan Note (Signed)
Approaching stage V. Scheduled for AV access Follows up with nephrology on a regular basis Last GFR 12 02/08/2023

## 2023-02-16 NOTE — Patient Instructions (Signed)

## 2023-02-16 NOTE — Assessment & Plan Note (Signed)
Chronic stable condition. Continue atorvastatin 40 mg daily. The 10-year ASCVD risk score (Arnett DK, et al., 2019) is: 22.6%   Values used to calculate the score:     Age: 67 years     Sex: Female     Is Non-Hispanic African American: Yes     Diabetic: Yes     Tobacco smoker: No     Systolic Blood Pressure: 123456 mmHg     Is BP treated: Yes     HDL Cholesterol: 57 mg/dL     Total Cholesterol: 161 mg/dL

## 2023-02-16 NOTE — Assessment & Plan Note (Signed)
Elevated blood pressure reading in the office but normal readings at home. Continue diltiazem 240 mg daily BP Readings from Last 3 Encounters:  02/16/23 (!) 146/98  01/14/23 136/86  11/17/22 122/72  Hemoglobin A1c not at goal at 9.4. Increase insulin lispro to 25 units daily Continue weekly Trulicity at 1.5 mg IM daily Farxiga 10 mg Cardiovascular risks associated with hypertension and diabetes discussed Diet and nutrition discussed Follow-up in 3 months.

## 2023-02-22 ENCOUNTER — Other Ambulatory Visit: Payer: Self-pay | Admitting: *Deleted

## 2023-02-22 DIAGNOSIS — N184 Chronic kidney disease, stage 4 (severe): Secondary | ICD-10-CM

## 2023-03-08 DIAGNOSIS — N185 Chronic kidney disease, stage 5: Secondary | ICD-10-CM | POA: Diagnosis not present

## 2023-03-09 ENCOUNTER — Ambulatory Visit (INDEPENDENT_AMBULATORY_CARE_PROVIDER_SITE_OTHER)
Admission: RE | Admit: 2023-03-09 | Discharge: 2023-03-09 | Disposition: A | Discharge status: Home / Self Care | Payer: 59 | Source: Ambulatory Visit | Attending: Vascular Surgery | Admitting: Vascular Surgery

## 2023-03-09 ENCOUNTER — Encounter: Payer: Self-pay | Admitting: Vascular Surgery

## 2023-03-09 ENCOUNTER — Ambulatory Visit (INDEPENDENT_AMBULATORY_CARE_PROVIDER_SITE_OTHER): Payer: 59 | Admitting: Vascular Surgery

## 2023-03-09 ENCOUNTER — Ambulatory Visit (HOSPITAL_COMMUNITY)
Admission: RE | Admit: 2023-03-09 | Discharge: 2023-03-09 | Disposition: A | Discharge status: Home / Self Care | Payer: 59 | Source: Ambulatory Visit | Attending: Vascular Surgery | Admitting: Vascular Surgery

## 2023-03-09 VITALS — BP 167/102 | HR 75 | Temp 97.8°F | Resp 14 | Ht <= 58 in | Wt 152.0 lb

## 2023-03-09 DIAGNOSIS — N184 Chronic kidney disease, stage 4 (severe): Secondary | ICD-10-CM | POA: Diagnosis not present

## 2023-03-09 DIAGNOSIS — N185 Chronic kidney disease, stage 5: Secondary | ICD-10-CM

## 2023-03-09 NOTE — Progress Notes (Signed)
Patient name: Nancy [name] MRN: [id number] DOB: [birth year] Sex: female  REASON FOR CONSULT: Permanent hemodialysis access placement  HPI: Nancy [name] is a 67 y.o. female, with history of hypertension, diabetes, stage V CKD and chronic hepatitis C that presents for evaluation of permanent hemodialysis access.  Patient states she is right-handed.  No previous access in the past.  Not currently on dialysis.  No chest wall implants.  She has also debated PD dialysis.  Her husband died last night.  Past Medical History:  Diagnosis Date   Carpal tunnel syndrome, bilateral    confirmed on nerve conduction studies   Chronic hepatitis C    considered cured 06/2016 post retroviral therapy    Diabetes mellitus type 2, uncontrolled    Diabetic peripheral neuropathy    GERD (gastroesophageal reflux disease)    History of endometriosis    History of Helicobacter infection 11/07   Hyperlipidemia    Liver fibrosis 02/17/2016   F2/3     Past Surgical History:  Procedure Laterality Date   ABDOMINAL HYSTERECTOMY     CARPAL TUNNEL RELEASE      Family History  Problem Relation Age of Onset   Cancer Mother        lung   Kidney disease Mother    Lung cancer Maternal Uncle    Breast cancer Maternal Grandmother    Liver cancer Maternal Grandfather    Heart disease Brother    Depression Brother    Diabetes Father    Dementia Father    Colon cancer Neg Hx     SOCIAL HISTORY: Social History   Socioeconomic History   Marital status: Single    Spouse name: Not on file   Number of children: 0   Years of education: 12   Highest education level: Not on file  Occupational History   Occupation: Forensic psychologist: [employer]   Occupation: Disabled  Tobacco Use   Smoking status: Never   Smokeless tobacco: Never  Vaping Use   Vaping Use: Never used  Substance and Sexual Activity   Alcohol use: No    Alcohol/week: 0.0 standard drinks of alcohol   Drug use: No   Sexual activity:  Yes    Birth control/protection: None    Comment: 1 committed partner  Other Topics Concern   Not on file  Social History Narrative   Current Social History 08/28/2019        Patient lives alone in a 7th floor apartment with elevator. There are not steps up to the entrance the patient uses.       Patient's method of transportation is via family member (mom or friend).      The highest level of education was high school diploma.      The patient currently disabled.      Identified important Relationships are "My mother"       Pets : None       Interests / Fun: "TV, movies"       Current Stressors: "People, my health"       Religious / Personal Beliefs: "B[employer]ist"       L. Silvano Rusk, RN, BSN       Social Determinants of Health   Financial Resource Strain: Low Risk  (04/24/2022)   Overall Financial Resource Strain (CARDIA)    Difficulty of Paying Living Expenses: Not hard at all  Food Insecurity: No Food Insecurity (08/25/2022)   Hunger Vital Sign    Worried About  Running Out of Food in the Last Year: Never true    [place] in the Last Year: Never true  Transportation Needs: No Transportation Needs (08/25/2022)   PRAPARE - Hydrologist (Medical): No    Lack of Transportation (Non-Medical): No  Physical Activity: Not on file  Stress: No Stress Concern Present (04/24/2022)   [place]    Feeling of Stress : Not at all  Social Connections: Socially Isolated (04/24/2022)   Social Connection and Isolation Panel [NHANES]    Frequency of Communication with Friends and Family: Three times a week    Frequency of Social Gatherings with Friends and Family: Three times a week    Attends Religious Services: Never    Active Member of Clubs or Organizations: No    Attends Archivist Meetings: Never    Marital Status: Divorced  Human resources officer Violence: Not At Risk (04/24/2022)    Humiliation, Afraid, Rape, and Kick questionnaire    Fear of Current or Ex-Partner: No    Emotionally Abused: No    Physically Abused: No    Sexually Abused: No    Allergies  Allergen Reactions   Ace Inhibitors Cough    Persistent dry cough   Amitriptyline Hcl Nausea And Vomiting   Aspirin Other (See Comments)   Latex Itching   Prednisone Nausea And Vomiting    Current Outpatient Medications  Medication Sig Dispense Refill   ACCU-CHEK FASTCLIX LANCETS MISC Use to check blood sugar up to 3 times a day 306 each 3   atorvastatin (LIPITOR) 40 MG tablet TAKE 1 TABLET BY MOUTH EVERY DAY 90 tablet 3   Blood Glucose Monitoring Suppl (ACCU-CHEK GUIDE) w/Device KIT 1 each by Does not apply route 3 (three) times daily. Check blood sugar 3 times a day 1 kit 0   Cholecalciferol (VITAMIN D3) 25 MCG (1000 UT) CAPS Take 1 capsule by mouth daily.     cyclobenzaprine (FLEXERIL) 10 MG tablet Take 1 tablet (10 mg total) by mouth at bedtime. 30 tablet 1   dapagliflozin propanediol (FARXIGA) 10 MG TABS tablet TAKE 1 TABLET BY MOUTH EVERY DAY BEFORE BREAKFAST 90 tablet 3   diclofenac Sodium (VOLTAREN) 1 % GEL Apply 2 g topically 4 (four) times daily. 100 g 2   diclofenac Sodium (VOLTAREN) 1 % GEL Apply 2 g topically 4 (four) times daily. 150 g 0   DILT-XR 240 MG 24 hr capsule Take 1 capsule (240 mg total) by mouth daily. 90 capsule 2   Dulaglutide (TRULICITY) 1.5 0000000 SOPN Inject 1.5 mg into the skin once a week. 6 mL 3   glucose blood (ACCU-CHEK GUIDE) test strip Check blood sugar 3 times a day 300 each 3   insulin glargine (LANTUS) 100 UNIT/ML injection INJECT 25 UNITS INTO THE SKIN EVERY MORNING WHEN YOU WAKE UP 30 mL 3   Insulin Syringe-Needle U-100 31G X 15/64" 0.5 ML MISC Use to inject insulin one time a day 100 each 11   Insulin Syringes, Disposable, U-100 1 ML MISC 2 Syringes by Does not apply route daily. Use to inject insulin into the skin 1 time daily. diag code E11.9. Insulin dependent 100  each 3   LORazepam (ATIVAN) 1 MG tablet Sig 1 mg 1 hour before procedure as needed. 10 tablet 0   omeprazole (PRILOSEC) 40 MG capsule TAKE 1 CAPSULE BY MOUTH EVERY DAY 90 capsule 3   senna (SENOKOT) 8.6  MG tablet Take 1 tablet (8.6 mg total) by mouth as needed for constipation. 60 tablet 3   Current Facility-Administered Medications  Medication Dose Route Frequency Provider Last Rate Last Admin   diclofenac Sodium (VOLTAREN) 1 % topical gel 4 g  4 g Topical TID PRN Delene Ruffini, MD        REVIEW OF SYSTEMS:  [X]  denotes positive finding, [ ]  denotes negative finding Cardiac  Comments:  Chest pain or chest pressure:    Shortness of breath upon exertion:    Short of breath when lying flat:    Irregular heart rhythm:        Vascular    Pain in calf, thigh, or hip brought on by ambulation:    Pain in feet at night that wakes you up from your sleep:     Blood clot in your veins:    Leg swelling:         Pulmonary    Oxygen at home:    Productive cough:     Wheezing:         Neurologic    Sudden weakness in arms or legs:     Sudden numbness in arms or legs:     Sudden onset of difficulty speaking or slurred speech:    Temporary loss of vision in one eye:     Problems with dizziness:         Gastrointestinal    Blood in stool:     Vomited blood:         Genitourinary    Burning when urinating:     Blood in urine:        Psychiatric    Major depression:         Hematologic    Bleeding problems:    Problems with blood clotting too easily:        Skin    Rashes or ulcers:        Constitutional    Fever or chills:      PHYSICAL EXAM: Vitals:   03/09/23 1156  BP: (!) 167/102  Pulse: 75  Resp: 14  Temp: 97.8 F (36.6 C)  TempSrc: Temporal  SpO2: 98%  Weight: 152 lb (68.9 kg)  Height: 4\' 9"  (1.448 m)    GENERAL: The patient is a well-nourished female, in no acute distress. The vital signs are documented above. CARDIAC: There is a regular rate and  rhythm.  VASCULAR:  Palpable radial pulses both upper extremities Palpable brachial pulses both upper extremities PULMONARY: No respiratory distress. ABDOMEN: Soft and non-tender. MUSCULOSKELETAL: There are no major deformities or cyanosis. NEUROLOGIC: No focal weakness or paresthesias are detected. SKIN: There are no ulcers or rashes noted. PSYCHIATRIC: The patient has a normal affect.  DATA:   Upper extremity arterial duplex shows triphasic waveforms in both upper extremities  Upper extremity vein mapping shows usable cephalic and basilic vein in the left arm  Assessment/Plan:   67 y.o. female, with history of hypertension, diabetes, stage V CKD and chronic hepatitis C that presents for evaluation of permanent hemodialysis access.  I discussed hemodialysis access in her nondominant arm which would be her left arm.  She appears to have usable cephalic and basilic vein.  I discussed basic steps of AV fistula creation.  I discussed risk and benefits including risk of failure to mature and steal syndrome.  She wishes to proceed.  All questions answered.  Will get this arranged [facility] at her convenience.  We  did briefly discussed PD dialysis and she is in favor of HD at this time.   Marty Heck, MD [facility] Office: [phone]

## 2023-03-10 ENCOUNTER — Telehealth: Payer: Self-pay | Admitting: *Deleted

## 2023-03-10 ENCOUNTER — Telehealth: Payer: Self-pay

## 2023-03-10 NOTE — Telephone Encounter (Signed)
Attempted to reach pt. in regard to scheduling procedure. Pt. Stated she would give [place] a call back.  Phoebe Sharps, RN

## 2023-03-12 NOTE — Telephone Encounter (Signed)
Opened in error

## 2023-03-22 ENCOUNTER — Telehealth: Payer: Self-pay

## 2023-03-22 NOTE — Telephone Encounter (Signed)
Pt called to schedule her surgery. She stated her mother recently was diagnosed with cancer and she is going to need to call us back when she is ready to proceed with scheduling.

## 2023-03-29 ENCOUNTER — Other Ambulatory Visit: Payer: Self-pay | Admitting: Student

## 2023-03-29 DIAGNOSIS — H61303 Acquired stenosis of external ear canal, unspecified, bilateral: Secondary | ICD-10-CM | POA: Diagnosis not present

## 2023-03-29 DIAGNOSIS — E11319 Type 2 diabetes mellitus with unspecified diabetic retinopathy without macular edema: Secondary | ICD-10-CM

## 2023-03-29 DIAGNOSIS — E1159 Type 2 diabetes mellitus with other circulatory complications: Secondary | ICD-10-CM

## 2023-03-29 DIAGNOSIS — K219 Gastro-esophageal reflux disease without esophagitis: Secondary | ICD-10-CM

## 2023-03-29 DIAGNOSIS — H6123 Impacted cerumen, bilateral: Secondary | ICD-10-CM | POA: Diagnosis not present

## 2023-04-13 ENCOUNTER — Inpatient Hospital Stay (HOSPITAL_COMMUNITY)
Admission: EM | Admit: 2023-04-13 | Discharge: 2023-04-22 | DRG: 637 | Disposition: A | Discharge status: Home Health | Payer: 59 | Attending: Internal Medicine | Admitting: Internal Medicine

## 2023-04-13 ENCOUNTER — Inpatient Hospital Stay (HOSPITAL_COMMUNITY): Payer: 59

## 2023-04-13 ENCOUNTER — Emergency Department (HOSPITAL_COMMUNITY): Payer: 59

## 2023-04-13 ENCOUNTER — Encounter (HOSPITAL_COMMUNITY): Payer: Self-pay

## 2023-04-13 DIAGNOSIS — K219 Gastro-esophageal reflux disease without esophagitis: Secondary | ICD-10-CM | POA: Diagnosis present

## 2023-04-13 DIAGNOSIS — Z841 Family history of disorders of kidney and ureter: Secondary | ICD-10-CM

## 2023-04-13 DIAGNOSIS — R1110 Vomiting, unspecified: Secondary | ICD-10-CM | POA: Diagnosis not present

## 2023-04-13 DIAGNOSIS — N179 Acute kidney failure, unspecified: Secondary | ICD-10-CM | POA: Diagnosis present

## 2023-04-13 DIAGNOSIS — D6489 Other specified anemias: Secondary | ICD-10-CM | POA: Diagnosis present

## 2023-04-13 DIAGNOSIS — N184 Chronic kidney disease, stage 4 (severe): Secondary | ICD-10-CM | POA: Diagnosis present

## 2023-04-13 DIAGNOSIS — R739 Hyperglycemia, unspecified: Secondary | ICD-10-CM | POA: Diagnosis present

## 2023-04-13 DIAGNOSIS — Z6831 Body mass index (BMI) 31.0-31.9, adult: Secondary | ICD-10-CM

## 2023-04-13 DIAGNOSIS — Z79899 Other long term (current) drug therapy: Secondary | ICD-10-CM

## 2023-04-13 DIAGNOSIS — Z0389 Encounter for observation for other suspected diseases and conditions ruled out: Secondary | ICD-10-CM | POA: Diagnosis not present

## 2023-04-13 DIAGNOSIS — M6281 Muscle weakness (generalized): Secondary | ICD-10-CM | POA: Diagnosis not present

## 2023-04-13 DIAGNOSIS — I10 Essential (primary) hypertension: Secondary | ICD-10-CM | POA: Diagnosis not present

## 2023-04-13 DIAGNOSIS — G9341 Metabolic encephalopathy: Secondary | ICD-10-CM | POA: Diagnosis present

## 2023-04-13 DIAGNOSIS — K567 Ileus, unspecified: Secondary | ICD-10-CM | POA: Diagnosis not present

## 2023-04-13 DIAGNOSIS — I130 Hypertensive heart and chronic kidney disease with heart failure and stage 1 through stage 4 chronic kidney disease, or unspecified chronic kidney disease: Secondary | ICD-10-CM | POA: Diagnosis not present

## 2023-04-13 DIAGNOSIS — E876 Hypokalemia: Secondary | ICD-10-CM | POA: Diagnosis not present

## 2023-04-13 DIAGNOSIS — I160 Hypertensive urgency: Secondary | ICD-10-CM | POA: Diagnosis present

## 2023-04-13 DIAGNOSIS — I7389 Other specified peripheral vascular diseases: Secondary | ICD-10-CM

## 2023-04-13 DIAGNOSIS — Z743 Need for continuous supervision: Secondary | ICD-10-CM | POA: Diagnosis not present

## 2023-04-13 DIAGNOSIS — E440 Moderate protein-calorie malnutrition: Secondary | ICD-10-CM | POA: Diagnosis not present

## 2023-04-13 DIAGNOSIS — R748 Abnormal levels of other serum enzymes: Secondary | ICD-10-CM | POA: Diagnosis not present

## 2023-04-13 DIAGNOSIS — E7220 Disorder of urea cycle metabolism, unspecified: Secondary | ICD-10-CM | POA: Diagnosis present

## 2023-04-13 DIAGNOSIS — Z781 Physical restraint status: Secondary | ICD-10-CM

## 2023-04-13 DIAGNOSIS — R1310 Dysphagia, unspecified: Secondary | ICD-10-CM | POA: Diagnosis present

## 2023-04-13 DIAGNOSIS — G9340 Encephalopathy, unspecified: Secondary | ICD-10-CM

## 2023-04-13 DIAGNOSIS — R1111 Vomiting without nausea: Secondary | ICD-10-CM | POA: Diagnosis not present

## 2023-04-13 DIAGNOSIS — E1165 Type 2 diabetes mellitus with hyperglycemia: Principal | ICD-10-CM | POA: Diagnosis present

## 2023-04-13 DIAGNOSIS — Z91040 Latex allergy status: Secondary | ICD-10-CM

## 2023-04-13 DIAGNOSIS — E1142 Type 2 diabetes mellitus with diabetic polyneuropathy: Secondary | ICD-10-CM | POA: Diagnosis not present

## 2023-04-13 DIAGNOSIS — Z4659 Encounter for fitting and adjustment of other gastrointestinal appliance and device: Secondary | ICD-10-CM | POA: Diagnosis not present

## 2023-04-13 DIAGNOSIS — B182 Chronic viral hepatitis C: Secondary | ICD-10-CM | POA: Diagnosis present

## 2023-04-13 DIAGNOSIS — I5032 Chronic diastolic (congestive) heart failure: Secondary | ICD-10-CM | POA: Diagnosis not present

## 2023-04-13 DIAGNOSIS — R4701 Aphasia: Secondary | ICD-10-CM | POA: Diagnosis not present

## 2023-04-13 DIAGNOSIS — E1122 Type 2 diabetes mellitus with diabetic chronic kidney disease: Secondary | ICD-10-CM | POA: Diagnosis not present

## 2023-04-13 DIAGNOSIS — Z886 Allergy status to analgesic agent status: Secondary | ICD-10-CM

## 2023-04-13 DIAGNOSIS — I425 Other restrictive cardiomyopathy: Secondary | ICD-10-CM | POA: Diagnosis not present

## 2023-04-13 DIAGNOSIS — E8721 Acute metabolic acidosis: Secondary | ICD-10-CM | POA: Diagnosis not present

## 2023-04-13 DIAGNOSIS — E11649 Type 2 diabetes mellitus with hypoglycemia without coma: Secondary | ICD-10-CM | POA: Diagnosis not present

## 2023-04-13 DIAGNOSIS — E11319 Type 2 diabetes mellitus with unspecified diabetic retinopathy without macular edema: Secondary | ICD-10-CM

## 2023-04-13 DIAGNOSIS — Z888 Allergy status to other drugs, medicaments and biological substances status: Secondary | ICD-10-CM

## 2023-04-13 DIAGNOSIS — E1100 Type 2 diabetes mellitus with hyperosmolarity without nonketotic hyperglycemic-hyperosmolar coma (NKHHC): Principal | ICD-10-CM

## 2023-04-13 DIAGNOSIS — R140 Abdominal distension (gaseous): Secondary | ICD-10-CM | POA: Diagnosis not present

## 2023-04-13 DIAGNOSIS — Z794 Long term (current) use of insulin: Secondary | ICD-10-CM | POA: Diagnosis not present

## 2023-04-13 DIAGNOSIS — Z7985 Long-term (current) use of injectable non-insulin antidiabetic drugs: Secondary | ICD-10-CM

## 2023-04-13 DIAGNOSIS — I421 Obstructive hypertrophic cardiomyopathy: Secondary | ICD-10-CM | POA: Diagnosis not present

## 2023-04-13 DIAGNOSIS — E785 Hyperlipidemia, unspecified: Secondary | ICD-10-CM | POA: Diagnosis present

## 2023-04-13 LAB — BASIC METABOLIC PANEL
Anion gap: 18 — ABNORMAL HIGH (ref 5–15)
Anion gap: 12 (ref 5–15)
Anion gap: 13 (ref 5–15)
Anion gap: 12 (ref 5–15)
BUN: 55 mg/dL — ABNORMAL HIGH (ref 8–23)
BUN: 52 mg/dL — ABNORMAL HIGH (ref 8–23)
BUN: 50 mg/dL — ABNORMAL HIGH (ref 8–23)
BUN: 46 mg/dL — ABNORMAL HIGH (ref 8–23)
CO2: 15 mmol/L — ABNORMAL LOW (ref 22–32)
CO2: 19 mmol/L — ABNORMAL LOW (ref 22–32)
CO2: 18 mmol/L — ABNORMAL LOW (ref 22–32)
CO2: 21 mmol/L — ABNORMAL LOW (ref 22–32)
Calcium: 8.1 mg/dL — ABNORMAL LOW (ref 8.9–10.3)
Calcium: 8 mg/dL — ABNORMAL LOW (ref 8.9–10.3)
Calcium: 8.4 mg/dL — ABNORMAL LOW (ref 8.9–10.3)
Calcium: 8.5 mg/dL — ABNORMAL LOW (ref 8.9–10.3)
Chloride: 96 mmol/L — ABNORMAL LOW (ref 98–111)
Chloride: 102 mmol/L (ref 98–111)
Chloride: 105 mmol/L (ref 98–111)
Chloride: 103 mmol/L (ref 98–111)
Creatinine, Ser: 5.5 mg/dL — ABNORMAL HIGH (ref 0.44–1.00)
Creatinine, Ser: 5.02 mg/dL — ABNORMAL HIGH (ref 0.44–1.00)
Creatinine, Ser: 4.97 mg/dL — ABNORMAL HIGH (ref 0.44–1.00)
Creatinine, Ser: 4.67 mg/dL — ABNORMAL HIGH (ref 0.44–1.00)
GFR, Estimated: 8 mL/min — ABNORMAL LOW (ref >60)
GFR, Estimated: 9 mL/min — ABNORMAL LOW (ref >60)
GFR, Estimated: 9 mL/min — ABNORMAL LOW (ref >60)
GFR, Estimated: 10 mL/min — ABNORMAL LOW (ref >60)
Glucose, Bld: 779 mg/dL (ref 70–99)
Glucose, Bld: 665 mg/dL (ref 70–99)
Glucose, Bld: 184 mg/dL — ABNORMAL HIGH (ref 70–99)
Glucose, Bld: 110 mg/dL — ABNORMAL HIGH (ref 70–99)
Potassium: 3.8 mmol/L (ref 3.5–5.1)
Potassium: 4 mmol/L (ref 3.5–5.1)
Potassium: 3.7 mmol/L (ref 3.5–5.1)
Potassium: 3.9 mmol/L (ref 3.5–5.1)
Sodium: 129 mmol/L — ABNORMAL LOW (ref 135–145)
Sodium: 133 mmol/L — ABNORMAL LOW (ref 135–145)
Sodium: 136 mmol/L (ref 135–145)
Sodium: 136 mmol/L (ref 135–145)

## 2023-04-13 LAB — PHOSPHORUS: Phosphorus: 1.9 mg/dL — ABNORMAL LOW (ref 2.5–4.6)

## 2023-04-13 LAB — CBC WITH DIFFERENTIAL/PLATELET
Abs Immature Granulocytes: 0.04 10*3/uL (ref 0.00–0.07)
Basophils Absolute: 0 10*3/uL (ref 0.0–0.1)
Basophils Relative: 0 %
Eosinophils Absolute: 0 10*3/uL (ref 0.0–0.5)
Eosinophils Relative: 0 %
HCT: 33.3 % — ABNORMAL LOW (ref 36.0–46.0)
Hemoglobin: 11.3 g/dL — ABNORMAL LOW (ref 12.0–15.0)
Immature Granulocytes: 0 %
Lymphocytes Relative: 24 %
Lymphs Abs: 2.6 10*3/uL (ref 0.7–4.0)
MCH: 27.4 pg (ref 26.0–34.0)
MCHC: 33.9 g/dL (ref 30.0–36.0)
MCV: 80.6 fL (ref 80.0–100.0)
Monocytes Absolute: 0.6 10*3/uL (ref 0.1–1.0)
Monocytes Relative: 5 %
Neutro Abs: 7.8 10*3/uL — ABNORMAL HIGH (ref 1.7–7.7)
Neutrophils Relative %: 71 %
Platelets: 246 10*3/uL (ref 150–400)
RBC: 4.13 MIL/uL (ref 3.87–5.11)
RDW: 13.2 % (ref 11.5–15.5)
WBC: 11.1 10*3/uL — ABNORMAL HIGH (ref 4.0–10.5)
nRBC: 0 % (ref 0.0–0.2)

## 2023-04-13 LAB — I-STAT VENOUS BLOOD GAS, ED
Acid-base deficit: 8 mmol/L — ABNORMAL HIGH (ref 0.0–2.0)
Bicarbonate: 17.4 mmol/L — ABNORMAL LOW (ref 20.0–28.0)
Calcium, Ion: 1.02 mmol/L — ABNORMAL LOW (ref 1.15–1.40)
HCT: 34 % — ABNORMAL LOW (ref 36.0–46.0)
Hemoglobin: 11.6 g/dL — ABNORMAL LOW (ref 12.0–15.0)
O2 Saturation: 99 %
Potassium: 4 mmol/L (ref 3.5–5.1)
Sodium: 129 mmol/L — ABNORMAL LOW (ref 135–145)
TCO2: 18 mmol/L — ABNORMAL LOW (ref 22–32)
pCO2, Ven: 33.7 mmHg — ABNORMAL LOW (ref 44–60)
pH, Ven: 7.32 (ref 7.25–7.43)
pO2, Ven: 123 mmHg — ABNORMAL HIGH (ref 32–45)

## 2023-04-13 LAB — RAPID URINE DRUG SCREEN, HOSP PERFORMED
Amphetamines: NOT DETECTED
Barbiturates: NOT DETECTED
Benzodiazepines: POSITIVE — AB
Cocaine: NOT DETECTED
Opiates: NOT DETECTED
Tetrahydrocannabinol: POSITIVE — AB

## 2023-04-13 LAB — URINALYSIS, ROUTINE W REFLEX MICROSCOPIC
Bacteria, UA: NONE SEEN
Bilirubin Urine: NEGATIVE
Glucose, UA: >=500 mg/dL — AB
Ketones, ur: NEGATIVE mg/dL
Leukocytes,Ua: NEGATIVE
Nitrite: NEGATIVE
Protein, ur: >=300 mg/dL — AB
Specific Gravity, Urine: 1.009 (ref 1.005–1.030)
pH: 7 (ref 5.0–8.0)

## 2023-04-13 LAB — GLUCOSE, CAPILLARY
Glucose-Capillary: 261 mg/dL — ABNORMAL HIGH (ref 70–99)
Glucose-Capillary: 184 mg/dL — ABNORMAL HIGH (ref 70–99)
Glucose-Capillary: 144 mg/dL — ABNORMAL HIGH (ref 70–99)
Glucose-Capillary: 244 mg/dL — ABNORMAL HIGH (ref 70–99)
Glucose-Capillary: 300 mg/dL — ABNORMAL HIGH (ref 70–99)
Glucose-Capillary: 321 mg/dL — ABNORMAL HIGH (ref 70–99)
Glucose-Capillary: 273 mg/dL — ABNORMAL HIGH (ref 70–99)

## 2023-04-13 LAB — HIV ANTIBODY (ROUTINE TESTING W REFLEX): HIV Screen 4th Generation wRfx: NONREACTIVE

## 2023-04-13 LAB — LACTIC ACID, PLASMA
Lactic Acid, Venous: 4.9 mmol/L (ref 0.5–1.9)
Lactic Acid, Venous: 5.1 mmol/L (ref 0.5–1.9)
Lactic Acid, Venous: 2.5 mmol/L (ref 0.5–1.9)

## 2023-04-13 LAB — MRSA NEXT GEN BY PCR, NASAL: MRSA by PCR Next Gen: NOT DETECTED

## 2023-04-13 LAB — CBG MONITORING, ED
Glucose-Capillary: >600 mg/dL (ref 70–99)
Glucose-Capillary: 595 mg/dL (ref 70–99)
Glucose-Capillary: 553 mg/dL (ref 70–99)
Glucose-Capillary: 548 mg/dL (ref 70–99)

## 2023-04-13 LAB — BETA-HYDROXYBUTYRIC ACID
Beta-Hydroxybutyric Acid: 0.1 mmol/L (ref 0.05–0.27)
Beta-Hydroxybutyric Acid: 0.06 mmol/L (ref 0.05–0.27)
Beta-Hydroxybutyric Acid: 0.88 mmol/L — ABNORMAL HIGH (ref 0.05–0.27)
Beta-Hydroxybutyric Acid: 0.37 mmol/L — ABNORMAL HIGH (ref 0.05–0.27)

## 2023-04-13 LAB — MAGNESIUM: Magnesium: 1.9 mg/dL (ref 1.7–2.4)

## 2023-04-13 LAB — AMMONIA: Ammonia: 71 umol/L — ABNORMAL HIGH (ref 9–35)

## 2023-04-13 LAB — OSMOLALITY: Osmolality: 331 mOsm/kg (ref 275–295)

## 2023-04-13 MED ORDER — LORAZEPAM 2 MG/ML IJ SOLN
INTRAMUSCULAR | Status: AC
  Filled 2023-04-13: qty 1

## 2023-04-13 MED ORDER — DEXMEDETOMIDINE HCL IN NACL 400 MCG/100ML IV SOLN
0.0000 ug/kg/h | INTRAVENOUS | Status: DC
  Administered 2023-04-13: 0.4 ug/kg/h via INTRAVENOUS
  Administered 2023-04-13 – 2023-04-14 (×4): 1.2 ug/kg/h via INTRAVENOUS
  Administered 2023-04-14: 0.9 ug/kg/h via INTRAVENOUS
  Administered 2023-04-14: 1.5 ug/kg/h via INTRAVENOUS
  Administered 2023-04-15: 0.1 ug/kg/h via INTRAVENOUS
  Administered 2023-04-15: 1.1 ug/kg/h via INTRAVENOUS
  Administered 2023-04-16: 0.4 ug/kg/h via INTRAVENOUS
  Administered 2023-04-16: 1 ug/kg/h via INTRAVENOUS
  Filled 2023-04-13 (×3): qty 100
  Filled 2023-04-13: qty 200
  Filled 2023-04-13 (×5): qty 100

## 2023-04-13 MED ORDER — LORAZEPAM 2 MG/ML IJ SOLN
1.0000 mg | Freq: Once | INTRAMUSCULAR | Status: AC
  Administered 2023-04-13: 1 mg via INTRAVENOUS
  Filled 2023-04-13: qty 1

## 2023-04-13 MED ORDER — LACTATED RINGERS IV SOLN: INTRAVENOUS | Status: DC

## 2023-04-13 MED ORDER — HYDRALAZINE HCL 20 MG/ML IJ SOLN
10.0000 mg | INTRAMUSCULAR | Status: DC | PRN
  Administered 2023-04-13 – 2023-04-18 (×9): 10 mg via INTRAVENOUS
  Filled 2023-04-13 (×9): qty 1

## 2023-04-13 MED ORDER — HALOPERIDOL LACTATE 5 MG/ML IJ SOLN: 5.0000 mg | Freq: Once | INTRAMUSCULAR | Status: DC

## 2023-04-13 MED ORDER — LACTATED RINGERS IV BOLUS
20.0000 mL/kg | Freq: Once | INTRAVENOUS | Status: AC
  Administered 2023-04-13: 1406 mL via INTRAVENOUS

## 2023-04-13 MED ORDER — POLYETHYLENE GLYCOL 3350 17 G PO PACK: 17.0000 g | PACK | Freq: Every day | ORAL | Status: DC | PRN

## 2023-04-13 MED ORDER — INSULIN ASPART 100 UNIT/ML IJ SOLN
0.0000 [IU] | INTRAMUSCULAR | Status: DC
  Administered 2023-04-13 (×2): 8 [IU] via SUBCUTANEOUS
  Administered 2023-04-14: 11 [IU] via SUBCUTANEOUS
  Administered 2023-04-14: 3 [IU] via SUBCUTANEOUS
  Administered 2023-04-14: 5 [IU] via SUBCUTANEOUS

## 2023-04-13 MED ORDER — DILTIAZEM HCL 60 MG PO TABS
60.0000 mg | ORAL_TABLET | Freq: Four times a day (QID) | ORAL | Status: DC
  Administered 2023-04-13 – 2023-04-15 (×4): 60 mg
  Filled 2023-04-13 (×4): qty 1

## 2023-04-13 MED ORDER — INSULIN REGULAR(HUMAN) IN NACL 100-0.9 UT/100ML-% IV SOLN
INTRAVENOUS | Status: DC
  Administered 2023-04-13: 9 [IU]/h via INTRAVENOUS
  Filled 2023-04-13: qty 100

## 2023-04-13 MED ORDER — DOCUSATE SODIUM 100 MG PO CAPS: 100.0000 mg | ORAL_CAPSULE | Freq: Two times a day (BID) | ORAL | Status: DC | PRN

## 2023-04-13 MED ORDER — NEPRO/CARBSTEADY PO LIQD
1000.0000 mL | ORAL | Status: DC
  Administered 2023-04-13 (×2): 1000 mL

## 2023-04-13 MED ORDER — LACTATED RINGERS IV BOLUS
1000.0000 mL | Freq: Once | INTRAVENOUS | Status: AC
  Administered 2023-04-13: 1000 mL via INTRAVENOUS

## 2023-04-13 MED ORDER — LACTULOSE 10 GM/15ML PO SOLN
20.0000 g | Freq: Three times a day (TID) | ORAL | Status: DC
  Administered 2023-04-13 – 2023-04-16 (×10): 20 g
  Filled 2023-04-13 (×10): qty 30

## 2023-04-13 MED ORDER — LABETALOL HCL 5 MG/ML IV SOLN
10.0000 mg | INTRAVENOUS | Status: DC | PRN
  Administered 2023-04-14 – 2023-04-17 (×2): 10 mg via INTRAVENOUS
  Filled 2023-04-13 (×2): qty 4

## 2023-04-13 MED ORDER — INSULIN GLARGINE-YFGN 100 UNIT/ML ~~LOC~~ SOLN
10.0000 [IU] | Freq: Two times a day (BID) | SUBCUTANEOUS | Status: DC
  Administered 2023-04-13 – 2023-04-15 (×4): 10 [IU] via SUBCUTANEOUS
  Filled 2023-04-13 (×5): qty 0.1

## 2023-04-13 MED ORDER — DEXTROSE IN LACTATED RINGERS 5 % IV SOLN: INTRAVENOUS | Status: DC

## 2023-04-13 MED ORDER — DEXTROSE 50 % IV SOLN: 0.0000 mL | INTRAVENOUS | Status: DC | PRN

## 2023-04-13 MED ORDER — DEXTROSE 50 % IV SOLN
0.0000 mL | INTRAVENOUS | Status: DC | PRN
  Administered 2023-04-19: 50 mL via INTRAVENOUS
  Filled 2023-04-13: qty 50

## 2023-04-13 MED ORDER — POTASSIUM CHLORIDE 10 MEQ/100ML IV SOLN
10.0000 meq | INTRAVENOUS | Status: AC
  Administered 2023-04-13 (×2): 10 meq via INTRAVENOUS
  Filled 2023-04-13 (×2): qty 100

## 2023-04-13 MED ORDER — INSULIN REGULAR(HUMAN) IN NACL 100-0.9 UT/100ML-% IV SOLN
INTRAVENOUS | Status: DC
  Administered 2023-04-13: 10.5 [IU]/h via INTRAVENOUS

## 2023-04-13 MED ORDER — CHLORHEXIDINE GLUCONATE CLOTH 2 % EX PADS
6.0000 | MEDICATED_PAD | Freq: Every day | CUTANEOUS | Status: DC
  Administered 2023-04-13 – 2023-04-16 (×3): 6 via TOPICAL

## 2023-04-13 MED ORDER — DILTIAZEM HCL ER 240 MG PO CP24
240.0000 mg | ORAL_CAPSULE | Freq: Every day | ORAL | Status: DC
  Filled 2023-04-13 (×2): qty 1

## 2023-04-13 NOTE — ED Notes (Signed)
Belongings given to patient's fiance

## 2023-04-13 NOTE — ED Notes (Signed)
CRITICAL VALUE STICKER  CRITICAL VALUE:lactic acid 4.9  RECEIVER (on-site recipient of call):I. Javad Salva  DATE & TIME NOTIFIED: 04/13/2023 1150  MESSENGER (representative from lab):Marchelle Folks  MD NOTIFIED: Hattie Perch  TIME OF NOTIFICATION:1150  RESPONSE:

## 2023-04-13 NOTE — Progress Notes (Addendum)
1910 patient responds to pain only No family present . Patient is on DEX gtt patient hypertensive notified [facility] regarding BP and serial labs from insulin gtt that's no longer active since 1551 today. Orders for sitter are in chart restrains have been discontinued sone 1320 today. NO belongings in patient room 2015 PRN IV BP meds given 2100 Head Ct completed

## 2023-04-13 NOTE — ED Notes (Signed)
Provider aware that patient is too agitated, confused, and unable to hold still to obtain head ct. CT tech informed

## 2023-04-13 NOTE — ED Notes (Signed)
CRITICAL VALUE STICKER  CRITICAL VALUE: Serum Osmolality  331  RECEIVER (on-site recipient of call):I. Tarik Teixeira  DATE & TIME NOTIFIED: 04/13/23 1128  MESSENGER (representative from lab):Katherine  MD NOTIFIED: [place]  TIME OF NOTIFICATION:1128  RESPONSE:

## 2023-04-13 NOTE — ED Triage Notes (Signed)
Pt arrives via [facility] from home. Family reported that the patient became altered this morning around 0900. Pt's blood sugar read high with ems. Pt became combative with EMS. Arrives in 4 point restraints. EMS gave 5mg  of versed and 5mg  of haldol approximately 15 mins ago.

## 2023-04-13 NOTE — ED Notes (Signed)
CRITICAL VALUE STICKER  CRITICAL VALUE:BG [phone]  RECEIVER (on-site recipient of call):I. Avagrace Botelho  DATE & TIME NOTIFIED: 04/13/2023 1110  MESSENGER (representative from lab):Marchelle Folks   MD NOTIFIED: Hattie Perch  TIME OF NOTIFICATION:1111  RESPONSE:

## 2023-04-13 NOTE — H&P (Signed)
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  She has known renal insufficiency has been evaluated for AV fistula ligated vascular surgery specialist.  Due to the fact she is so combative CT of the head cannot be completed at this time.  Pulmonary critical care has been asked to evaluate and admit her to the intensive care unit utilize Precedex for sedation and attempt to get CT of the head to rule out organic reason for confusion.  We will treat her hypercalcemia, elevated lactic acid and renal insufficiency at this time.   Pertinent  Medical History   Past Medical History:  Diagnosis Date   Carpal tunnel syndrome, bilateral    confirmed on nerve conduction studies   Chronic hepatitis C (HCC)    considered cured 06/2016 post retroviral therapy    Diabetes mellitus type 2, uncontrolled    Diabetic peripheral neuropathy (HCC)    GERD (gastroesophageal reflux disease)    History of endometriosis    History of Helicobacter infection 11/07   Hyperlipidemia    Liver fibrosis 02/17/2016   F2/3      Significant Hospital Events: Including procedures, antibiotic start and stop dates in addition to other pertinent events     Interim History / Subjective:  Extremely combative  Objective   Blood pressure (!) 188/98, pulse (!) 116, temperature (!) 97.4 F (36.3 C), temperature source Axillary, resp. rate (!) 25, height 4\' 9"  (1.448 m), weight 70.3 kg, last menstrual period 02/15/2009, SpO2 100 %.       No intake or output  data in the 24 hours ending 04/13/23 1211 Filed Weights   04/13/23 1015  Weight: 70.3 kg    Examination: General: Morbidly obese female is combative and restrained in the bed HENT: No overt JVD or lymphadenopathy.  Pupils equal reactive to light does not track or follow Lungs: Diminished in the bases Cardiovascular: Heart sounds are regular Abdomen: Obese soft nontender Extremities: Mild edema Neuro: Combative does not follow commands GU: Not observed as of yet place foley  Resolved Hospital Problem list     Assessment & Plan:  Altered mental status in the setting of hyperglycemia, acute on chronic renal insufficiency, and poorly controlled diabetes mellitus. Admit to the intensive care unit Aggressive fluid resuscitation Aggressive glucose control Utilize Precedex and attempt to sedate her for CT of the head to rule out organic reason for confusion  Chronic renal insufficiency nearing stage IV with recent evaluation for AV fistula Lab Results  Component Value Date   CREATININE 5.50 (H) 04/13/2023   CREATININE 2.32 (H) 05/14/2022   CREATININE 2.31 (H) 01/06/2022   CREATININE 1.31 (H) 07/18/2019   CREATININE 0.77 03/06/2015   CREATININE 0.97 04/25/2013   CREATININE 0.84 02/29/2012   Aggressive fluid hydration she needs at least 3 to 4 L of IV fluids  Poorly controlled diabetes mellitus CBG (last 3)  Recent Labs    04/13/23 1006 04/13/23 1113 04/13/23 1158  GLUCAP >600* 595* 553*   Check for ketones Aggressive fluid resuscitation Insulin drip  Best Practice (right click and "  Reselect all SmartList Selections" daily)   Diet/type: NPO DVT prophylaxis: not indicated GI prophylaxis: PPI Lines: N/A Foley:  N/A Code Status:  full code Last date of multidisciplinary goals of care discussion [tbd]  Labs   CBC: Recent Labs  Lab 04/13/23 1016 04/13/23 1038  WBC 11.1*  --   NEUTROABS 7.8*  --   HGB 11.3* 11.6*  HCT 33.3* 34.0*  MCV 80.6  --   PLT 246  --      Basic Metabolic Panel: Recent Labs  Lab 04/13/23 1016 04/13/23 1038  NA 129* 129*  K 3.8 4.0  CL 96*  --   CO2 15*  --   GLUCOSE 779*  --   BUN 55*  --   CREATININE 5.50*  --   CALCIUM 8.1*  --    GFR: Estimated Creatinine Clearance: 8.3 mL/min (A) (by C-G formula based on SCr of 5.5 mg/dL (H)). Recent Labs  Lab 04/13/23 1016 04/13/23 1029  WBC 11.1*  --   LATICACIDVEN  --  4.9*    Liver Function Tests: No results for input(s): "AST", "ALT", "ALKPHOS", "BILITOT", "PROT", "ALBUMIN" in the last 168 hours. No results for input(s): "LIPASE", "AMYLASE" in the last 168 hours. Recent Labs  Lab 04/13/23 1016  AMMONIA 71*    ABG    Component Value Date/Time   HCO3 17.4 (L) 04/13/2023 1038   TCO2 18 (L) 04/13/2023 1038   ACIDBASEDEF 8.0 (H) 04/13/2023 1038   O2SAT 99 04/13/2023 1038     Coagulation Profile: No results for input(s): "INR", "PROTIME" in the last 168 hours.  Cardiac Enzymes: No results for input(s): "CKTOTAL", "CKMB", "CKMBINDEX", "TROPONINI" in the last 168 hours.  HbA1C: Hemoglobin A1C  Date/Time Value Ref Range Status  02/16/2023 03:59 PM 9.4 (A) 4.0 - 5.6 % Final  11/17/2022 01:56 PM 10.1 (A) 4.0 - 5.6 % Final   Hgb A1c MFr Bld  Date/Time Value Ref Range Status  09/04/2010 02:37 PM 10.0 %   06/11/2010 02:17 PM 8.8 %     CBG: Recent Labs  Lab 04/13/23 1006 04/13/23 1113 04/13/23 1158  GLUCAP >600* 595* 553*    Review of Systems:   na  Past Medical History:  She,  has a past medical history of Carpal tunnel syndrome, bilateral, Chronic hepatitis C (HCC), Diabetes mellitus type 2, uncontrolled, Diabetic peripheral neuropathy (HCC), GERD (gastroesophageal reflux disease), History of endometriosis, History of Helicobacter infection (11/07), Hyperlipidemia, and Liver fibrosis (02/17/2016).   Surgical History:   Past Surgical History:  Procedure Laterality Date   ABDOMINAL HYSTERECTOMY     CARPAL TUNNEL RELEASE       Social  History:   reports that she has never smoked. She has never used smokeless tobacco. She reports that she does not drink alcohol and does not use drugs.   Family History:  Her family history includes Breast cancer in her maternal grandmother; Cancer in her mother; Dementia in her father; Depression in her brother; Diabetes in her father; Heart disease in her brother; Kidney disease in her mother; Liver cancer in her maternal grandfather; Lung cancer in her maternal uncle. There is no history of Colon cancer.   Allergies Allergies  Allergen Reactions   Ace Inhibitors Cough    Persistent dry cough   Amitriptyline Hcl Nausea And Vomiting   Aspirin Other (See Comments)   Latex Itching   Prednisone Nausea And Vomiting     Home Medications  Prior to Admission medications   Medication Sig Start Date  End Date Taking? Authorizing Provider  ACCU-CHEK FASTCLIX LANCETS MISC Use to check blood sugar up to 3 times a day 10/11/18   Eulah Pont, MD  ACCU-CHEK GUIDE test strip CHECK BLOOD SUGAR 3 TIMES A DAY 03/30/23   Georgina Quint, MD  atorvastatin (LIPITOR) 40 MG tablet TAKE 1 TABLET BY MOUTH EVERY DAY 03/30/23   Georgina Quint, MD  Blood Glucose Monitoring Suppl (ACCU-CHEK GUIDE) w/Device KIT 1 each by Does not apply route 3 (three) times daily. Check blood sugar 3 times a day 07/08/20   Merrilyn Puma, MD  Cholecalciferol (VITAMIN D3) 25 MCG (1000 UT) CAPS Take 1 capsule by mouth daily.    [provider]  cyclobenzaprine (FLEXERIL) 10 MG tablet Take 1 tablet (10 mg total) by mouth at bedtime. 10/02/22   Corwin Levins, MD  dapagliflozin propanediol (FARXIGA) 10 MG TABS tablet TAKE 1 TABLET BY MOUTH EVERY DAY BEFORE BREAKFAST 02/16/23   Georgina Quint, MD  diclofenac Sodium (VOLTAREN) 1 % GEL Apply 2 g topically 4 (four) times daily. 11/25/20   Dolan Amen, MD  diclofenac Sodium (VOLTAREN) 1 % GEL Apply 2 g topically 4 (four) times daily. 01/06/22   Adron Bene, MD   DILT-XR 240 MG 24 hr capsule Take 1 capsule (240 mg total) by mouth daily. 12/29/22   Georgina Quint, MD  Dulaglutide (TRULICITY) 1.5 MG/0.5ML SOPN Inject 1.5 mg into the skin once a week. 11/17/22   Georgina Quint, MD  insulin glargine (LANTUS) 100 UNIT/ML injection INJECT 25 UNITS INTO THE SKIN EVERY MORNING WHEN YOU WAKE UP 02/16/23   Georgina Quint, MD  Insulin Syringe-Needle U-100 31G X 15/64" 0.5 ML MISC Use to inject insulin one time a day 07/08/20   Merrilyn Puma, MD  Insulin Syringes, Disposable, U-100 1 ML MISC 2 Syringes by Does not apply route daily. Use to inject insulin into the skin 1 time daily. diag code E11.9. Insulin dependent 07/08/20   Merrilyn Puma, MD  LORazepam (ATIVAN) 1 MG tablet Sig 1 mg 1 hour before procedure as needed. 04/20/22   Georgina Quint, MD  omeprazole (PRILOSEC) 40 MG capsule TAKE 1 CAPSULE BY MOUTH EVERY DAY 03/30/23   Georgina Quint, MD  senna (SENOKOT) 8.6 MG tablet Take 1 tablet (8.6 mg total) by mouth as needed for constipation. 07/08/20   Merrilyn Puma, MD     Critical care time: 32 min    Brett Canales Addilyne Backs ACNP Acute Care Nurse Practitioner [facility] Pulmonary/Critical Care Please consult Amion 04/13/2023, 12:12 PM

## 2023-04-13 NOTE — ED Provider Notes (Signed)
[place] EMERGENCY DEPARTMENT AT [facility] Provider Note   CSN: [id number] Arrival date & time: 04/13/23  1002     History {Add pertinent medical, surgical, social history, OB history to HPI:1} Chief Complaint  Patient presents with   Altered Mental Status   Altered/ Combative   Hyperglycemia    Chiqueta Lotti is a 67 y.o. female.  67 year old female with a past medical history of type 2 diabetes, stage IV CKD, hypertension, and hyperlipidemia presents to the emergency department via EMS due to combativeness and altered mental status.  Patient's glucose was measuring over 600 for EMS.  She was attempting to run away from the providers and hurt others around her so she required sedation and restraints with EMS.  She is unable to provide much HPI.  Family at bedside states she is not on dialysis and reports they noticed a change in her mental status today.   Altered Mental Status Hyperglycemia Associated symptoms: altered mental status        Home Medications Prior to Admission medications   Medication Sig Start Date End Date Taking? Authorizing Provider  ACCU-CHEK FASTCLIX LANCETS MISC Use to check blood sugar up to 3 times a day 10/11/18   Eulah Pont, MD  ACCU-CHEK GUIDE test strip CHECK BLOOD SUGAR 3 TIMES A DAY 03/30/23   Georgina Quint, MD  atorvastatin (LIPITOR) 40 MG tablet TAKE 1 TABLET BY MOUTH EVERY DAY 03/30/23   Georgina Quint, MD  Blood Glucose Monitoring Suppl (ACCU-CHEK GUIDE) w/Device KIT 1 each by Does not apply route 3 (three) times daily. Check blood sugar 3 times a day 07/08/20   Merrilyn Puma, MD  Cholecalciferol (VITAMIN D3) 25 MCG (1000 UT) CAPS Take 1 capsule by mouth daily.    [provider]  cyclobenzaprine (FLEXERIL) 10 MG tablet Take 1 tablet (10 mg total) by mouth at bedtime. 10/02/22   Corwin Levins, MD  dapagliflozin propanediol (FARXIGA) 10 MG TABS tablet TAKE 1 TABLET BY MOUTH EVERY DAY BEFORE BREAKFAST 02/16/23    Georgina Quint, MD  diclofenac Sodium (VOLTAREN) 1 % GEL Apply 2 g topically 4 (four) times daily. 11/25/20   Dolan Amen, MD  diclofenac Sodium (VOLTAREN) 1 % GEL Apply 2 g topically 4 (four) times daily. 01/06/22   Adron Bene, MD  DILT-XR 240 MG 24 hr capsule Take 1 capsule (240 mg total) by mouth daily. 12/29/22   Georgina Quint, MD  Dulaglutide (TRULICITY) 1.5 MG/0.5ML SOPN Inject 1.5 mg into the skin once a week. 11/17/22   Georgina Quint, MD  insulin glargine (LANTUS) 100 UNIT/ML injection INJECT 25 UNITS INTO THE SKIN EVERY MORNING WHEN YOU WAKE UP 02/16/23   Georgina Quint, MD  Insulin Syringe-Needle U-100 31G X 15/64" 0.5 ML MISC Use to inject insulin one time a day 07/08/20   Merrilyn Puma, MD  Insulin Syringes, Disposable, U-100 1 ML MISC 2 Syringes by Does not apply route daily. Use to inject insulin into the skin 1 time daily. diag code E11.9. Insulin dependent 07/08/20   Merrilyn Puma, MD  LORazepam (ATIVAN) 1 MG tablet Sig 1 mg 1 hour before procedure as needed. 04/20/22   Georgina Quint, MD  omeprazole (PRILOSEC) 40 MG capsule TAKE 1 CAPSULE BY MOUTH EVERY DAY 03/30/23   Georgina Quint, MD  senna (SENOKOT) 8.6 MG tablet Take 1 tablet (8.6 mg total) by mouth as needed for constipation. 07/08/20   Merrilyn Puma, MD  Allergies    Ace inhibitors, Amitriptyline hcl, Aspirin, Latex, and Prednisone    Review of Systems   Review of Systems  All other systems reviewed and are negative.   Physical Exam Updated Vital Signs BP (!) 188/98   Pulse (!) 116   Temp (!) 97.4 F (36.3 C) (Axillary)   Resp (!) 25   Ht 4\' 9"  (1.448 m)   Wt 70.3 kg   LMP 02/15/2009   SpO2 100%   BMI 33.54 kg/m  Physical Exam  ED Results / Procedures / Treatments   Labs (all labs ordered are listed, but only abnormal results are displayed) Labs Reviewed  BASIC METABOLIC PANEL - Abnormal; Notable for the following components:      Result Value    Sodium 129 (*)    Chloride 96 (*)    CO2 15 (*)    Glucose, Bld 779 (*)    BUN 55 (*)    Creatinine, Ser 5.50 (*)    Calcium 8.1 (*)    GFR, Estimated 8 (*)    Anion gap 18 (*)    All other components within normal limits  OSMOLALITY - Abnormal; Notable for the following components:   Osmolality 331 (*)    All other components within normal limits  CBC WITH DIFFERENTIAL/PLATELET - Abnormal; Notable for the following components:   WBC 11.1 (*)    Hemoglobin 11.3 (*)    HCT 33.3 (*)    Neutro Abs 7.8 (*)    All other components within normal limits  AMMONIA - Abnormal; Notable for the following components:   Ammonia 71 (*)    All other components within normal limits  LACTIC ACID, PLASMA - Abnormal; Notable for the following components:   Lactic Acid, Venous 4.9 (*)    All other components within normal limits  CBG MONITORING, ED - Abnormal; Notable for the following components:   Glucose-Capillary >600 (*)    All other components within normal limits  CBG MONITORING, ED - Abnormal; Notable for the following components:   Glucose-Capillary 595 (*)    All other components within normal limits  I-STAT VENOUS BLOOD GAS, ED - Abnormal; Notable for the following components:   pCO2, Ven 33.7 (*)    pO2, Ven 123 (*)    Bicarbonate 17.4 (*)    TCO2 18 (*)    Acid-base deficit 8.0 (*)    Sodium 129 (*)    Calcium, Ion 1.02 (*)    HCT 34.0 (*)    Hemoglobin 11.6 (*)    All other components within normal limits  CBG MONITORING, ED - Abnormal; Notable for the following components:   Glucose-Capillary 553 (*)    All other components within normal limits  BETA-HYDROXYBUTYRIC ACID  BASIC METABOLIC PANEL  BASIC METABOLIC PANEL  BASIC METABOLIC PANEL  URINALYSIS, ROUTINE W REFLEX MICROSCOPIC  LACTIC ACID, PLASMA  BASIC METABOLIC PANEL  RAPID URINE DRUG SCREEN, HOSP PERFORMED    EKG None  Radiology DG Chest Port 1 View  Result Date: 04/13/2023 CLINICAL DATA:  Vomiting. EXAM:  PORTABLE CHEST 1 VIEW COMPARISON:  08/21/2013 FINDINGS: No consolidation, pneumothorax or effusion. No edema. Normal cardiopericardial silhouette without edema. Overlapping cardiac leads. The extreme left inferior costophrenic angle is clipped off the edge of the film. IMPRESSION: No acute cardiopulmonary disease Electronically Signed   By: Karen Kays M.D.   On: 04/13/2023 10:49    Procedures Procedures  {Document cardiac monitor, telemetry assessment procedure when appropriate:1}  Medications Ordered in ED  Medications  insulin regular, human (MYXREDLIN) 100 units/ 100 mL infusion (9 Units/hr Intravenous New Bag/Given 04/13/23 1203)  lactated ringers infusion ( Intravenous New Bag/Given 04/13/23 1153)  dextrose 5 % in lactated ringers infusion (0 mLs Intravenous Hold 04/13/23 1204)  dextrose 50 % solution 0-50 mL (has no administration in time range)  potassium chloride 10 mEq in 100 mL IVPB (10 mEq Intravenous New Bag/Given 04/13/23 1155)  lactated ringers bolus 1,406 mL (0 mLs Intravenous Stopped 04/13/23 1108)  lactated ringers bolus 1,000 mL (1,000 mLs Intravenous New Bag/Given 04/13/23 1152)    ED Course/ Medical Decision Making/ A&P Clinical Course as of 04/13/23 1221  Tue Apr 13, 2023  1218 Acid-base deficit(!): 8.0 [AL]    Clinical Course User Index [AL] Larisha Vencill, DO   {   Click here for ABCD2, HEART and other calculatorsREFRESH Note before signing :1}                          Medical Decision Making 67 year old female presenting to the emerged to the department with elevated sugar and altered mental status.  Patient arrived to the emergency department after receiving Versed and Haldol per EMS.  She is on 4 point restraints. IV fluids ordered and lab work ordered for suspicion of HHS.  We ordered a head CT to also evaluate for any intracranial abnormalities that could have precipitated the HHS or is the actual cause of her altered mental status.  Elevate glucose could also be  secondary to stress response. Labs came back showing normal pH of 7.3. Patient's initial glucose was 779 and repeat after a fluid bolus is 553.  Bicarb 17 with acid base deficit of 8.  Potassium 4 and lactic acid elevated at 4.9.  Osmolality 331.  White count mildly elevated 11.1. ICU physician consulted for further management since the patient will likely need to go to the unit.   Amount and/or Complexity of Data Reviewed Labs: ordered. Radiology: ordered.  Risk Prescription drug management.    {Document critical care time when appropriate:1} {Document review of labs and clinical decision tools ie heart score, Chads2Vasc2 etc:1}  {Document your independent review of radiology images, and any outside records:1} {Document your discussion with family members, caretakers, and with consultants:1} {Document social determinants of health affecting pt's care:1} {Document your decision making why or why not admission, treatments were needed:1} Final Clinical Impression(s) / ED Diagnoses Final diagnoses:  None    Rx / DC Orders ED Discharge Orders     None

## 2023-04-13 NOTE — Progress Notes (Addendum)
eLink Physician-Brief Progress Note Patient Name: Nancy [name] DOB: 19-Aug-1956 MRN: [id number]   Date of Service  04/13/2023  HPI/Events of Note  67 year old female with a history of type 2 diabetes mellitus, CKD stage IV, and essential hypertension who was brought into the emergency department with HHS, AKI, and hypertensive urgency.  He was recently transitioned to insulin subcutaneous in place of insulin drip.  eICU Interventions  Change labs to reflect a.m. checks.  Add home diltiazem  As needed hydralazine and labetalol   2105 - change dilt XR to Q6H to go down tube  2221 - Adjust CBG to q4h. Maintain lantus 10 bid  2356 -CT head within normal limits, Beta-hydroxybutyrate improving, renew sitter order  Intervention Category Intermediate Interventions: Hypertension - evaluation and management  [name] 04/13/2023, 7:58 PM

## 2023-04-14 ENCOUNTER — Inpatient Hospital Stay (HOSPITAL_COMMUNITY): Payer: 59

## 2023-04-14 DIAGNOSIS — Z4659 Encounter for fitting and adjustment of other gastrointestinal appliance and device: Secondary | ICD-10-CM | POA: Diagnosis not present

## 2023-04-14 DIAGNOSIS — G9340 Encephalopathy, unspecified: Secondary | ICD-10-CM | POA: Diagnosis not present

## 2023-04-14 DIAGNOSIS — R739 Hyperglycemia, unspecified: Secondary | ICD-10-CM | POA: Diagnosis not present

## 2023-04-14 LAB — GLUCOSE, CAPILLARY
Glucose-Capillary: 199 mg/dL — ABNORMAL HIGH (ref 70–99)
Glucose-Capillary: 221 mg/dL — ABNORMAL HIGH (ref 70–99)
Glucose-Capillary: 306 mg/dL — ABNORMAL HIGH (ref 70–99)
Glucose-Capillary: 240 mg/dL — ABNORMAL HIGH (ref 70–99)
Glucose-Capillary: 268 mg/dL — ABNORMAL HIGH (ref 70–99)
Glucose-Capillary: 179 mg/dL — ABNORMAL HIGH (ref 70–99)

## 2023-04-14 LAB — CBC
HCT: 31.7 % — ABNORMAL LOW (ref 36.0–46.0)
Hemoglobin: 11.2 g/dL — ABNORMAL LOW (ref 12.0–15.0)
MCH: 27.6 pg (ref 26.0–34.0)
MCHC: 35.3 g/dL (ref 30.0–36.0)
MCV: 78.1 fL — ABNORMAL LOW (ref 80.0–100.0)
Platelets: 269 10*3/uL (ref 150–400)
RBC: 4.06 MIL/uL (ref 3.87–5.11)
RDW: 13 % (ref 11.5–15.5)
WBC: 15 10*3/uL — ABNORMAL HIGH (ref 4.0–10.5)
nRBC: 0 % (ref 0.0–0.2)

## 2023-04-14 LAB — BASIC METABOLIC PANEL
Anion gap: 11 (ref 5–15)
BUN: 45 mg/dL — ABNORMAL HIGH (ref 8–23)
CO2: 20 mmol/L — ABNORMAL LOW (ref 22–32)
Calcium: 8.7 mg/dL — ABNORMAL LOW (ref 8.9–10.3)
Chloride: 103 mmol/L (ref 98–111)
Creatinine, Ser: 4.63 mg/dL — ABNORMAL HIGH (ref 0.44–1.00)
GFR, Estimated: 10 mL/min — ABNORMAL LOW (ref >60)
Glucose, Bld: 247 mg/dL — ABNORMAL HIGH (ref 70–99)
Potassium: 3.1 mmol/L — ABNORMAL LOW (ref 3.5–5.1)
Sodium: 134 mmol/L — ABNORMAL LOW (ref 135–145)

## 2023-04-14 LAB — PHOSPHORUS: Phosphorus: 3 mg/dL (ref 2.5–4.6)

## 2023-04-14 LAB — MAGNESIUM: Magnesium: 2 mg/dL (ref 1.7–2.4)

## 2023-04-14 LAB — LACTIC ACID, PLASMA: Lactic Acid, Venous: 2.3 mmol/L (ref 0.5–1.9)

## 2023-04-14 MED ORDER — ORAL CARE MOUTH RINSE
15.0000 mL | OROMUCOSAL | Status: DC
  Administered 2023-04-14 – 2023-04-19 (×20): 15 mL via OROMUCOSAL

## 2023-04-14 MED ORDER — POTASSIUM CHLORIDE 20 MEQ PO PACK
60.0000 meq | PACK | Freq: Once | ORAL | Status: AC
  Administered 2023-04-14: 60 meq
  Filled 2023-04-14: qty 3

## 2023-04-14 MED ORDER — GLUCERNA 1.5 CAL PO LIQD
1000.0000 mL | ORAL | Status: DC
  Administered 2023-04-14 – 2023-04-17 (×3): 1000 mL
  Filled 2023-04-14 (×5): qty 1000

## 2023-04-14 MED ORDER — MIDAZOLAM HCL 2 MG/2ML IJ SOLN
INTRAMUSCULAR | Status: AC
  Filled 2023-04-14: qty 2

## 2023-04-14 MED ORDER — ORAL CARE MOUTH RINSE: 15.0000 mL | OROMUCOSAL | Status: DC | PRN

## 2023-04-14 MED ORDER — HALOPERIDOL LACTATE 5 MG/ML IJ SOLN
5.0000 mg | Freq: Once | INTRAMUSCULAR | Status: AC
  Administered 2023-04-14: 5 mg via INTRAVENOUS

## 2023-04-14 MED ORDER — INSULIN ASPART 100 UNIT/ML IJ SOLN
0.0000 [IU] | INTRAMUSCULAR | Status: DC
  Administered 2023-04-14: 11 [IU] via SUBCUTANEOUS
  Administered 2023-04-14 – 2023-04-15 (×2): 7 [IU] via SUBCUTANEOUS
  Administered 2023-04-15: 4 [IU] via SUBCUTANEOUS
  Administered 2023-04-15: 20 [IU] via SUBCUTANEOUS
  Administered 2023-04-15: 7 [IU] via SUBCUTANEOUS
  Administered 2023-04-15: 11 [IU] via SUBCUTANEOUS
  Administered 2023-04-16 (×2): 4 [IU] via SUBCUTANEOUS
  Administered 2023-04-16: 3 [IU] via SUBCUTANEOUS
  Administered 2023-04-16 – 2023-04-17 (×2): 4 [IU] via SUBCUTANEOUS

## 2023-04-14 MED ORDER — MIDAZOLAM HCL 2 MG/2ML IJ SOLN
2.0000 mg | Freq: Once | INTRAMUSCULAR | Status: AC
  Administered 2023-04-14: 2 mg via INTRAVENOUS

## 2023-04-14 MED ORDER — HALOPERIDOL LACTATE 5 MG/ML IJ SOLN
INTRAMUSCULAR | Status: AC
  Filled 2023-04-14: qty 1

## 2023-04-14 NOTE — Progress Notes (Addendum)
eLink Physician-Brief Progress Note Patient Name: Nancy [name] DOB: 25-Mar-1956 MRN: [id number]   Date of Service  04/14/2023  HPI/Events of Note  67 year old female initially presented with hypertensive urgency, restraints Precedex.  She is now off of insulin drip, remains on Precedex.  She had a large bowel movement and became very agitated.  eICU Interventions  Restraints as needed, Haldol 5mg  IV once. QTC 450   2236 -still agitated and fighting despite Haldol.  Attempting to bite personnel.  Will add 2 mg of Versed, 4 point restraints.  I suspect she will be a lot calmer once she is done defecating, but she is still actively defecating during exam.  Intervention Category Minor Interventions: Agitation / anxiety - evaluation and management  Nashawn Hillock 04/14/2023, 10:17 PM

## 2023-04-14 NOTE — Progress Notes (Signed)
  Transition of Care (TOC) Screening Note   Patient Details  Name: Nancy [name] Date of Birth: 11-11-1956   Transition of Care [facility]) CM/SW Contact:    Tom-Johnson, Hershal Coria, RN Phone Number: 04/14/2023, 2:28 PM  Transition of Care Department [facility]) has reviewed patient and no TOC needs or recommendations have been identified at this time. TOC will continue to monitor patient advancement through interdisciplinary progression rounds. If new patient transition needs arise, please place a TOC consult.

## 2023-04-14 NOTE — Progress Notes (Incomplete)
Called into patient's room at approx. 2200 by Verlon Au RN, informed that pt had soiled self and needed help getting cleaned up, asking if patient had any PRNs for agitation.

## 2023-04-14 NOTE — Progress Notes (Signed)
Called into patient's room at approx. 2150 by Verlon Au RN, informed that pt had soiled self and needed help getting cleaned up, asking this RN if patient had any PRNs for agitation. Upon entering the room, pt was attempting to climb out of bed, get safety mitts off, and remove her IVs with her teeth. Called ELINK at 2154 and notified of the situation. This RN began to titrate up on pt's Precedex. Several other staff members entered the room to attempt to hold the patient down who was at this point screaming and crawling around in bed. Pt attempting to scratch, bite, and grab at staff. ELINK button was hit in the room 2 more times, security called to bedside to assist in holding/turning patient while other staff members cleaned pt up. 2215 received orders for 5mg  IV Haldol once and BL wrist restraints. Haldol was ineffective, pt attempting to kick at staff members. Pressed ELINK button again, received orders for 2mg  IV Versed once and 4 pt restraints with Posey belt. Once versed given and Precedex at max, 6 staff members along with 2 security personnel successfully cleaned the patient and changed bedsheets. One IV was removed by patient during this event.

## 2023-04-14 NOTE — Progress Notes (Signed)
Initial Nutrition Assessment  DOCUMENTATION CODES:   Non-severe (moderate) malnutrition in context of chronic illness  INTERVENTION:   Resume tube feeds via [name]: - Start Glucerna 1.5 @ 30 ml/hr and advance rate by 10 ml q 4 hours to goal rate of 50 ml/hr (1200 ml/day)  Tube feeding regimen at goal rate provides 1800 kcal, 99 grams of protein, and 911 ml of H2O.  - Recommend adding free water flushes of 100 ml q 4 hours once IVF are discontinued (total of 1511 ml free water daily)  NUTRITION DIAGNOSIS:   Moderate Malnutrition related to chronic illness (CKD stage IV, hepatitis C) as evidenced by mild muscle depletion, percent weight loss (8.6% weight loss in 3 months).  GOAL:   Patient will meet greater than or equal to 90% of their needs  MONITOR:   Diet advancement, Labs, Weight trends, TF tolerance, I & O's  REASON FOR ASSESSMENT:   Consult Enteral/tube feeding initiation and management (trickle tube feeds)  ASSESSMENT:   67 year old female who presented to the ED on 5/07 with AMS. PMH of T2DM, CKD stage IV, hepatitis C, GERD, HLD. Pt admitted with HHS and AKI.  05/07 - NG tube placed (tip and side port in stomach) 05/08 - NG tube exchanged for [name] (tip distal stomach or proximal duodenum)  Discussed pt with RN and during ICU rounds. NG tube removed and replaced with [name] and bridle this AM. Pt with acute metabolic encephalopathy. CT head normal. Per MD, okay to advance tube feeds to goal.  Unable to obtain diet and weight history at this time. Reviewed weight history in chart. Pt with a 6.2 kg weight loss since 01/14/23. This is a 8.6% weight loss in 3 months which is clinically significant for timeframe. Based on weight loss and NFPE, pt with moderate chronic malnutrition.  Pt tolerating Nepro at 20 ml/hr. Given persistently high CBG's, will switch over to Glucerna 1.5. Will start at 30 ml/hr and titrate to goal rate. RN aware of plan.  Current TF: Nepro @ 20  ml/hr  Medications reviewed and include: SSI q 4 hours, semglee 10 units BID, lactulose 20 grams TID, precedex drip IVF: LR @ 125 ml/hr  Labs reviewed: sodium 134, potassium 3.1, BUN 45, creatinine 4.63, ionized calcium 1.02, ammonia 71, lactic acid 2.3, WBC 15.0 CBG's: 199-321 x 12 hours  UOP: 4090 ml x 24 hours I/O's: +3.2 L since admit  NUTRITION - FOCUSED PHYSICAL EXAM:  Flowsheet Row Most Recent Value  Orbital Region No depletion  Upper Arm Region No depletion  Thoracic and Lumbar Region Mild depletion  Buccal Region No depletion  Temple Region No depletion  Clavicle Bone Region Mild depletion  Clavicle and Acromion Bone Region Moderate depletion  Scapular Bone Region Mild depletion  Dorsal Hand Unable to assess  Patellar Region No depletion  Anterior Thigh Region Mild depletion  Posterior Calf Region No depletion  Edema (RD Assessment) None  Hair Reviewed  Eyes Reviewed  Mouth Reviewed  Skin Reviewed  Nails Reviewed       Diet Order:   Diet Order             Diet NPO time specified  Diet effective now                   EDUCATION NEEDS:   Not appropriate for education at this time  Skin:  Skin Assessment: Reviewed RN Assessment  Last BM:  04/13/23  Height:   Ht Readings from Last 1 Encounters:  04/13/23 4\' 9"  (1.448 m)    Weight:   Wt Readings from Last 1 Encounters:  04/14/23 65.6 kg    BMI:  Body mass index is 31.3 kg/m.  Estimated Nutritional Needs:   Kcal:  1700-1900  Protein:  85-100 grams  Fluid:  1.7-1.9 L    Mertie Clause, MS, RD, LDN Inpatient Clinical Dietitian Please see AMiON for contact information.

## 2023-04-14 NOTE — Procedures (Signed)
[name]  Person Inserting Tube:  Jacquelyn Shadrick L, RD Tube Type:  [name] - 43 inches Tube Size:  10 Tube Location:  Right nare Secured by: Bridle Technique Used to Measure Tube Placement:  Marking at nare/corner of mouth [name] Secured At:  64 cm   [name] Tube Team Note:  Consult received to place a [name] feeding tube.   X-ray is required, abdominal x-ray has been ordered by the [name] team. Please confirm tube placement before using the [name] tube.   If the tube becomes dislodged please keep the tube and contact the [name] team at [email or web address] for replacement.  If after hours and replacement cannot be delayed, place a NG tube and confirm placement with an abdominal x-ray.    Arley Garant RD, LDN Clinical Dietitian See AMiON for contact information.    

## 2023-04-14 NOTE — Inpatient Diabetes Management (Signed)
Inpatient Diabetes Program Recommendations  AACE/ADA: New Consensus Statement on Inpatient Glycemic Control (2015)  Target Ranges:  Prepandial:   less than 140 mg/dL      Peak postprandial:   less than 180 mg/dL (1-2 hours)      Critically ill patients:  140 - 180 mg/dL   Lab Results  Component Value Date   GLUCAP 306 (H) 04/14/2023   HGBA1C 9.4 (A) 02/16/2023    Review of Glycemic Control  Latest Reference Range & Units 04/13/23 23:11 04/14/23 03:09 04/14/23 07:01 04/14/23 11:20  Glucose-Capillary 70 - 99 mg/dL 409 (H) 811 (H) 914 (H) 306 (H)   Diabetes history: DM 2 Outpatient Diabetes medications:  Farxiga 10 mg daily Trulicity 1.5 mg weekly Lantus 25 units daily Current orders for Inpatient glycemic control:  Novolog 0-20 units q 4 hours Semglee 10 units bid Glucerna 1.5 cal at 50 ml/hr Inpatient Diabetes Program Recommendations:    Note elevated blood sugars. Tube feed switched to Glucerna.  Due to reduced renal function, consider reducing Novolog correction to sensitive q 4 hours (0-9 units) and add low dose tube feed coverage (Novolog 2 units q 4 hours) if blood sugars remain>180 mg/dL.   Thanks,  Beryl Meager, RN, BC-ADM Inpatient Diabetes Coordinator Pager [phone]  (8a-5p)

## 2023-04-14 NOTE — Progress Notes (Signed)
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  She has known renal insufficiency has been evaluated for AV fistula ligated vascular surgery specialist.  Due to the fact she is so combative CT of the head cannot be completed at this time.  Pulmonary critical care has been asked to evaluate and admit her to the intensive care unit utilize Precedex for sedation and attempt to get CT of the head to rule out organic reason for confusion.  We will treat her hypercalcemia, elevated lactic acid and renal insufficiency at this time.   Pertinent  Medical History   Past Medical History:  Diagnosis Date   Carpal tunnel syndrome, bilateral    confirmed on nerve conduction studies   Chronic hepatitis C (HCC)    considered cured 06/2016 post retroviral therapy    Diabetes mellitus type 2, uncontrolled    Diabetic peripheral neuropathy (HCC)    GERD (gastroesophageal reflux disease)    History of endometriosis    History of Helicobacter infection 11/07   Hyperlipidemia    Liver fibrosis 02/17/2016   F2/3      Significant Hospital Events: Including procedures, antibiotic start and stop dates in addition to other pertinent events   Head CT 5/7 >> normal  Interim History / Subjective:  Intermittent hypertension Remains on Precedex 1.2 Room air I/O+ 2.7 L total, UOP 4 L / 24 hours CBG 199-273 Anion gap 11, hypokalemia 3.1 S Cr 5.50 > 4.63   Objective   Blood pressure (!) 144/70, pulse 61, temperature 98.4 F (36.9 C),  temperature source Axillary, resp. rate 18, height 4\' 9"  (1.448 m), weight 65.6 kg, last menstrual period 02/15/2009, SpO2 99 %.        Intake/Output Summary (Last 24 hours) at 04/14/2023 0754 Last data filed at 04/14/2023 0600 Gross per 24 hour  Intake 6829.79 ml  Output 4090 ml  Net 2739.79 ml   Filed Weights   04/13/23 1015 04/14/23 0500  Weight: 70.3 kg 65.6 kg    Examination: General: Obese woman, sedated, somewhat lethargic.  No distress HENT: Oropharynx clear, NG tube in place, pupils equal Lungs: Shallow breath sounds, decreased to both bases, no crackles or wheezes Cardiovascular: Distant, regular, no murmur Abdomen: Nondistended, obese, nontender, positive bowel sounds Extremities: Trace pretibial edema Neuro: Lethargic, sedated.  Has been more active earlier today.  Precedex 1.3.  Does begin to wake up, open eyes, track with stimulation, pain.  Did not follow commands  Resolved Hospital Problem list     Assessment & Plan:  Acute metabolic encephalopathy, principally due to HONC/HHS.  Question contribution hyperammonemia.  Head CT normal Agitated delirium -Aggressive IV fluid resuscitation -Aggressive glucose control: Insulin infusion, transition to subcutaneous insulin overnight -Correct underlying metabolic disarray, including support for her acute on chronic stage IV renal failure -Lactulose -Precedex, wean as able  Chronic renal insufficiency nearing stage IV with recent evaluation for AV fistula -Following BMP, urine output with aggressive IV fluid resuscitation -Avoid nephrotoxins -Ensure adequate renal perfusion  Poorly controlled diabetes mellitus -Adjust insulin subcutaneous as she initiates p.o. diet -  Home Trulicity on hold, home Lantus on hold -Follow CBG, goal 180  Chronic hepatitis C -Lactulose as ordered, question whether she may require long-term -Check LFT, consider acute hepatitis panel depending on result   Best Practice (right click and  "Reselect all SmartList Selections" daily)   Diet/type: NPO DVT prophylaxis: not indicated GI prophylaxis: PPI Lines: N/A Foley:  N/A Code Status:  full code Last date of multidisciplinary goals of care discussion [tbd]  Labs   CBC: Recent Labs  Lab 04/13/23 1016 04/13/23 1038 04/14/23 0108  WBC 11.1*  --  15.0*  NEUTROABS 7.8*  --   --   HGB 11.3* 11.6* 11.2*  HCT 33.3* 34.0* 31.7*  MCV 80.6  --  78.1*  PLT 246  --  269    Basic Metabolic Panel: Recent Labs  Lab 04/13/23 1016 04/13/23 1038 04/13/23 1115 04/13/23 1420 04/13/23 1607 04/14/23 0108  NA 129* 129* 133* 136 136 134*  K 3.8 4.0 4.0 3.7 3.9 3.1*  CL 96*  --  102 105 103 103  CO2 15*  --  19* 18* 21* 20*  GLUCOSE 779*  --  665* 184* 110* 247*  BUN 55*  --  52* 50* 46* 45*  CREATININE 5.50*  --  5.02* 4.97* 4.67* 4.63*  CALCIUM 8.1*  --  8.0* 8.4* 8.5* 8.7*  MG  --   --   --  1.9  --  2.0  PHOS  --   --   --  1.9*  --  3.0   GFR: Estimated Creatinine Clearance: 9.4 mL/min (A) (by C-G formula based on SCr of 4.63 mg/dL (H)). Recent Labs  Lab 04/13/23 1016 04/13/23 1029 04/13/23 1420 04/13/23 1936 04/14/23 0108  WBC 11.1*  --   --   --  15.0*  LATICACIDVEN  --  4.9* 5.1* 2.5* 2.3*    Liver Function Tests: No results for input(s): "AST", "ALT", "ALKPHOS", "BILITOT", "PROT", "ALBUMIN" in the last 168 hours. No results for input(s): "LIPASE", "AMYLASE" in the last 168 hours. Recent Labs  Lab 04/13/23 1016  AMMONIA 71*    ABG    Component Value Date/Time   HCO3 17.4 (L) 04/13/2023 1038   TCO2 18 (L) 04/13/2023 1038   ACIDBASEDEF 8.0 (H) 04/13/2023 1038   O2SAT 99 04/13/2023 1038     Coagulation Profile: No results for input(s): "INR", "PROTIME" in the last 168 hours.  Cardiac Enzymes: No results for input(s): "CKTOTAL", "CKMB", "CKMBINDEX", "TROPONINI" in the last 168 hours.  HbA1C: Hemoglobin A1C  Date/Time Value Ref Range Status  02/16/2023 03:59 PM 9.4 (A) 4.0 - 5.6 % Final   11/17/2022 01:56 PM 10.1 (A) 4.0 - 5.6 % Final   Hgb A1c MFr Bld  Date/Time Value Ref Range Status  09/04/2010 02:37 PM 10.0 %   06/11/2010 02:17 PM 8.8 %     CBG: Recent Labs  Lab 04/13/23 2019 04/13/23 2207 04/13/23 2311 04/14/23 0309 04/14/23 0701  GLUCAP 300* 321* 273* 199* 221*    Critical care time: 31 min    Levy Pupa, MD, PhD 04/14/2023, 7:54 AM [facility] [phone] or if no answer before 7:00PM call [phone] For any issues after 7:00PM please call eLink [phone]

## 2023-04-15 DIAGNOSIS — R739 Hyperglycemia, unspecified: Secondary | ICD-10-CM | POA: Diagnosis not present

## 2023-04-15 DIAGNOSIS — G9341 Metabolic encephalopathy: Secondary | ICD-10-CM

## 2023-04-15 DIAGNOSIS — E440 Moderate protein-calorie malnutrition: Secondary | ICD-10-CM | POA: Insufficient documentation

## 2023-04-15 LAB — CBC
HCT: 29.7 % — ABNORMAL LOW (ref 36.0–46.0)
Hemoglobin: 10 g/dL — ABNORMAL LOW (ref 12.0–15.0)
MCH: 27.5 pg (ref 26.0–34.0)
MCHC: 33.7 g/dL (ref 30.0–36.0)
MCV: 81.6 fL (ref 80.0–100.0)
Platelets: 222 10*3/uL (ref 150–400)
RBC: 3.64 MIL/uL — ABNORMAL LOW (ref 3.87–5.11)
RDW: 13.6 % (ref 11.5–15.5)
WBC: 11.3 10*3/uL — ABNORMAL HIGH (ref 4.0–10.5)
nRBC: 0 % (ref 0.0–0.2)

## 2023-04-15 LAB — LACTIC ACID, PLASMA
Lactic Acid, Venous: 2.1 mmol/L (ref 0.5–1.9)
Lactic Acid, Venous: 2.1 mmol/L (ref 0.5–1.9)

## 2023-04-15 LAB — GLUCOSE, CAPILLARY
Glucose-Capillary: 260 mg/dL — ABNORMAL HIGH (ref 70–99)
Glucose-Capillary: 375 mg/dL — ABNORMAL HIGH (ref 70–99)
Glucose-Capillary: 226 mg/dL — ABNORMAL HIGH (ref 70–99)
Glucose-Capillary: 103 mg/dL — ABNORMAL HIGH (ref 70–99)
Glucose-Capillary: 108 mg/dL — ABNORMAL HIGH (ref 70–99)
Glucose-Capillary: 232 mg/dL — ABNORMAL HIGH (ref 70–99)

## 2023-04-15 LAB — COMPREHENSIVE METABOLIC PANEL
ALT: 15 U/L (ref 0–44)
AST: 29 U/L (ref 15–41)
Albumin: 2.1 g/dL — ABNORMAL LOW (ref 3.5–5.0)
Alkaline Phosphatase: 71 U/L (ref 38–126)
Anion gap: 7 (ref 5–15)
BUN: 44 mg/dL — ABNORMAL HIGH (ref 8–23)
CO2: 19 mmol/L — ABNORMAL LOW (ref 22–32)
Calcium: 8.6 mg/dL — ABNORMAL LOW (ref 8.9–10.3)
Chloride: 114 mmol/L — ABNORMAL HIGH (ref 98–111)
Creatinine, Ser: 4.58 mg/dL — ABNORMAL HIGH (ref 0.44–1.00)
GFR, Estimated: 10 mL/min — ABNORMAL LOW (ref >60)
Glucose, Bld: 183 mg/dL — ABNORMAL HIGH (ref 70–99)
Potassium: 3.3 mmol/L — ABNORMAL LOW (ref 3.5–5.1)
Sodium: 140 mmol/L (ref 135–145)
Total Bilirubin: 0.4 mg/dL (ref 0.3–1.2)
Total Protein: 5.1 g/dL — ABNORMAL LOW (ref 6.5–8.1)

## 2023-04-15 LAB — MAGNESIUM: Magnesium: 2.1 mg/dL (ref 1.7–2.4)

## 2023-04-15 LAB — AMMONIA: Ammonia: 21 umol/L (ref 9–35)

## 2023-04-15 MED ORDER — AMLODIPINE BESYLATE 5 MG PO TABS
5.0000 mg | ORAL_TABLET | Freq: Every day | ORAL | Status: DC
  Administered 2023-04-15 – 2023-04-16 (×2): 5 mg
  Filled 2023-04-15 (×2): qty 1

## 2023-04-15 MED ORDER — INSULIN GLARGINE-YFGN 100 UNIT/ML ~~LOC~~ SOLN
15.0000 [IU] | Freq: Two times a day (BID) | SUBCUTANEOUS | Status: DC
  Filled 2023-04-15: qty 0.15

## 2023-04-15 MED ORDER — INSULIN GLARGINE-YFGN 100 UNIT/ML ~~LOC~~ SOLN
5.0000 [IU] | Freq: Once | SUBCUTANEOUS | Status: DC
  Filled 2023-04-15: qty 0.05

## 2023-04-15 MED ORDER — INSULIN ASPART 100 UNIT/ML IJ SOLN
3.0000 [IU] | INTRAMUSCULAR | Status: DC
  Administered 2023-04-15 – 2023-04-16 (×5): 3 [IU] via SUBCUTANEOUS

## 2023-04-15 MED ORDER — HEPARIN SODIUM (PORCINE) 5000 UNIT/ML IJ SOLN
5000.0000 [IU] | Freq: Three times a day (TID) | INTRAMUSCULAR | Status: DC
  Administered 2023-04-15 – 2023-04-22 (×22): 5000 [IU] via SUBCUTANEOUS
  Filled 2023-04-15 (×22): qty 1

## 2023-04-15 MED ORDER — POTASSIUM CHLORIDE 20 MEQ PO PACK
60.0000 meq | PACK | Freq: Once | ORAL | Status: AC
  Administered 2023-04-15: 60 meq
  Filled 2023-04-15: qty 3

## 2023-04-15 MED ORDER — INSULIN GLARGINE-YFGN 100 UNIT/ML ~~LOC~~ SOLN
20.0000 [IU] | Freq: Two times a day (BID) | SUBCUTANEOUS | Status: DC
  Administered 2023-04-15 – 2023-04-17 (×4): 20 [IU] via SUBCUTANEOUS
  Filled 2023-04-15 (×5): qty 0.2

## 2023-04-15 MED ORDER — INSULIN ASPART 100 UNIT/ML IJ SOLN: 3.0000 [IU] | INTRAMUSCULAR | Status: DC

## 2023-04-15 NOTE — Progress Notes (Signed)
NAME:  Nancy [name], MRN:  [id number], DOB:  01/14/1956, LOS: 2 ADMISSION DATE:  04/13/2023, CONSULTATION DATE: 04/13/2023 REFERRING MD: Emergency department physician, CHIEF COMPLAINT: Altered mental status, hyperglycemia acute on chronic renal insufficiency  History of Present Illness:  67 yo female brought to ER with altered mental status, agitation and glucose > 600, hypercalcemia, elevated ammonia, lactic acidosis and renal insufficiency.  UDS positive for THC.  She was started on precedex.  Pertinent  Medical History  Carpal tunnel, Hep C, DM type 2 with neuropathy, GERD, Endometriosis, HLD  Significant Hospital Events: Including procedures, antibiotic start and stop dates in addition to other pertinent events   5/07 Admit, start precedex, CT head normal  Interim History / Subjective:  Agitated overnight.  Precedex increased and put in four point restraints.  Objective   Blood pressure 117/64, pulse (!) 51, temperature 98.7 F (37.1 C), temperature source Axillary, resp. rate (!) 24, height 4\' 9"  (1.448 m), weight 65.8 kg, last menstrual period 02/15/2009, SpO2 97 %.        Intake/Output Summary (Last 24 hours) at 04/15/2023 0906 Last data filed at 04/15/2023 0800 Gross per 24 hour  Intake 4299.62 ml  Output 1930 ml  Net 2369.62 ml   Filed Weights   04/13/23 1015 04/14/23 0500 04/15/23 0500  Weight: 70.3 kg 65.6 kg 65.8 kg    Examination:  General - sedated Eyes - pupils reactive ENT - no sinus tenderness, no stridor Cardiac - regular rate/rhythm, no murmur Chest - equal breath sounds b/l, no wheezing or rales Abdomen - soft, non tender, + bowel sounds Extremities - no cyanosis, clubbing, or edema Skin - no rashes Neuro - RASS -2  Resolved Hospital Problem list     Assessment & Plan:   Acute metabolic encephalopathy with agitated delirium from hyperosmolar hyperglycemia, elevated ammonia level and THC. - wean precedex to keep RASS 0 to -1 - continue  lactulose - monitor neuro status - f/u ammonia level  DM type 2 poorly controlled with hyperglycemia. - SSI  CKD 4. Hypokalemia. Lactic acidosis. - monitor urine outpt - f/u BMET, lactic acid  Anemia of critical illness and chronic disease. - f/u CBC - transfuse for Hb < 7  Hx of HTN, HLD. - goal SBP < 160 - d/c diltiazem while on precedex due to bradycardia; will change to norvasc - hold outpt lasix, lipitor  Best Practice (right click and "Reselect all SmartList Selections" daily)   Diet/type: tubefeeds DVT prophylaxis: not indicated GI prophylaxis: PPI Lines: N/A Foley:  N/A Code Status:  full code Last date of multidisciplinary goals of care discussion [tbd]  Labs       Latest Ref Rng & Units 04/15/2023   12:23 AM 04/14/2023    1:08 AM 04/13/2023    4:07 PM  CMP  Glucose 70 - 99 mg/dL 782  956  213   BUN 8 - 23 mg/dL 44  45  46   Creatinine 0.44 - 1.00 mg/dL 0.86  5.78  4.69   Sodium 135 - 145 mmol/L 140  134  136   Potassium 3.5 - 5.1 mmol/L 3.3  3.1  3.9   Chloride 98 - 111 mmol/L 114  103  103   CO2 22 - 32 mmol/L 19  20  21    Calcium 8.9 - 10.3 mg/dL 8.6  8.7  8.5   Total Protein 6.5 - 8.1 g/dL 5.1     Total Bilirubin 0.3 - 1.2 mg/dL 0.4  Alkaline Phos 38 - 126 U/L 71     AST 15 - 41 U/L 29     ALT 0 - 44 U/L 15          Latest Ref Rng & Units 04/15/2023   12:23 AM 04/14/2023    1:08 AM 04/13/2023   10:38 AM  CBC  WBC 4.0 - 10.5 K/uL 11.3  15.0    Hemoglobin 12.0 - 15.0 g/dL 81.1  91.4  78.2   Hematocrit 36.0 - 46.0 % 29.7  31.7  34.0   Platelets 150 - 400 K/uL 222  269      ABG    Component Value Date/Time   HCO3 17.4 (L) 04/13/2023 1038   TCO2 18 (L) 04/13/2023 1038   ACIDBASEDEF 8.0 (H) 04/13/2023 1038   O2SAT 99 04/13/2023 1038    CBG (last 3)  Recent Labs    04/14/23 2312 04/15/23 0319 04/15/23 0703  GLUCAP 179* 260* 375*   Critical care time: 34 minutes  Coralyn Helling, MD [facility] Pager - [phone]  or [phone] 04/15/2023, 9:07 AM

## 2023-04-16 DIAGNOSIS — R739 Hyperglycemia, unspecified: Secondary | ICD-10-CM | POA: Diagnosis not present

## 2023-04-16 DIAGNOSIS — G9340 Encephalopathy, unspecified: Secondary | ICD-10-CM | POA: Diagnosis not present

## 2023-04-16 LAB — GLUCOSE, CAPILLARY
Glucose-Capillary: 195 mg/dL — ABNORMAL HIGH (ref 70–99)
Glucose-Capillary: 170 mg/dL — ABNORMAL HIGH (ref 70–99)
Glucose-Capillary: 180 mg/dL — ABNORMAL HIGH (ref 70–99)
Glucose-Capillary: 120 mg/dL — ABNORMAL HIGH (ref 70–99)
Glucose-Capillary: 137 mg/dL — ABNORMAL HIGH (ref 70–99)
Glucose-Capillary: 166 mg/dL — ABNORMAL HIGH (ref 70–99)

## 2023-04-16 LAB — BASIC METABOLIC PANEL
Anion gap: 11 (ref 5–15)
BUN: 49 mg/dL — ABNORMAL HIGH (ref 8–23)
CO2: 20 mmol/L — ABNORMAL LOW (ref 22–32)
Calcium: 8.7 mg/dL — ABNORMAL LOW (ref 8.9–10.3)
Chloride: 109 mmol/L (ref 98–111)
Creatinine, Ser: 4.74 mg/dL — ABNORMAL HIGH (ref 0.44–1.00)
GFR, Estimated: 10 mL/min — ABNORMAL LOW (ref >60)
Glucose, Bld: 271 mg/dL — ABNORMAL HIGH (ref 70–99)
Potassium: 4.3 mmol/L (ref 3.5–5.1)
Sodium: 140 mmol/L (ref 135–145)

## 2023-04-16 LAB — CBC
HCT: 34.5 % — ABNORMAL LOW (ref 36.0–46.0)
Hemoglobin: 11.5 g/dL — ABNORMAL LOW (ref 12.0–15.0)
MCH: 27.7 pg (ref 26.0–34.0)
MCHC: 33.3 g/dL (ref 30.0–36.0)
MCV: 83.1 fL (ref 80.0–100.0)
Platelets: 262 10*3/uL (ref 150–400)
RBC: 4.15 MIL/uL (ref 3.87–5.11)
RDW: 14.2 % (ref 11.5–15.5)
WBC: 12.6 10*3/uL — ABNORMAL HIGH (ref 4.0–10.5)
nRBC: 0 % (ref 0.0–0.2)

## 2023-04-16 MED ORDER — LACTULOSE 10 GM/15ML PO SOLN
20.0000 g | Freq: Three times a day (TID) | ORAL | Status: DC
  Administered 2023-04-16 – 2023-04-17 (×2): 20 g via ORAL
  Filled 2023-04-16 (×2): qty 30

## 2023-04-16 MED ORDER — AMLODIPINE BESYLATE 5 MG PO TABS
5.0000 mg | ORAL_TABLET | Freq: Every day | ORAL | Status: DC
  Filled 2023-04-16: qty 1

## 2023-04-16 MED ORDER — ACETAMINOPHEN 325 MG PO TABS
650.0000 mg | ORAL_TABLET | ORAL | Status: DC | PRN
  Administered 2023-04-16: 650 mg via ORAL
  Filled 2023-04-16: qty 2

## 2023-04-16 NOTE — Progress Notes (Signed)
NAME:  Nancy [name], MRN:  [id number], DOB:  Nov 24, 1956, LOS: 3 ADMISSION DATE:  04/13/2023, CONSULTATION DATE: 04/13/2023 REFERRING MD: Emergency department physician, CHIEF COMPLAINT: Altered mental status, hyperglycemia acute on chronic renal insufficiency  History of Present Illness:  67 yo female brought to ER with altered mental status, agitation and glucose > 600, hypercalcemia, elevated ammonia, lactic acidosis and renal insufficiency.  UDS positive for THC.  She was started on precedex.  Pertinent  Medical History  Carpal tunnel, Hep C, DM type 2 with neuropathy, GERD, Endometriosis, HLD  Significant Hospital Events: Including procedures, antibiotic start and stop dates in addition to other pertinent events   5/07 Admit, start precedex, CT head normal  Interim History / Subjective:  More calm this morning.  Weaning precedex.  Wants something to drink and wants to speak with her mother.  Objective   Blood pressure 136/75, pulse 67, temperature 98.1 F (36.7 C), temperature source Oral, resp. rate (!) 27, height 4\' 9"  (1.448 m), weight 66.2 kg, last menstrual period 02/15/2009, SpO2 97 %.        Intake/Output Summary (Last 24 hours) at 04/16/2023 0838 Last data filed at 04/16/2023 4098 Gross per 24 hour  Intake 1697.9 ml  Output 2276 ml  Net -578.1 ml   Filed Weights   04/14/23 0500 04/15/23 0500 04/16/23 0500  Weight: 65.6 kg 65.8 kg 66.2 kg    Examination:  General - alert Eyes - pupils reactive ENT - no sinus tenderness, no stridor Cardiac - regular rate/rhythm, no murmur Chest - equal breath sounds b/l, no wheezing or rales Abdomen - soft, non tender, + bowel sounds Extremities - no cyanosis, clubbing, or edema Skin - no rashes Neuro - normal strength, moves extremities, follows commands Psych - normal mood and behavior  Resolved Hospital Problem list   Lactic acidosis  Assessment & Plan:   Acute metabolic encephalopathy with agitated delirium from  hyperosmolar hyperglycemia, elevated ammonia level and THC. - wean off precedex to keep RASS 0 - continue lactulose  DM type 2 poorly controlled with hyperglycemia. - SSI with tube feed coverage and semglee  CKD 4. - baseline creatinine 2.32 from 05/14/22 - monitor urine outpt - f/u BMET  Anemia of critical illness and chronic disease. - f/u CBC intermittently - transfuse for Hb < 7  Hx of HTN, HLD. - goal SBP < 160 - continue norvasc - hold outpt lasix, lipitor  Dysphagia. - speech to check swallow  Best Practice (right click and "Reselect all SmartList Selections" daily)   Diet/type: tubefeeds DVT prophylaxis: not indicated GI prophylaxis: PPI Lines: N/A Foley:  N/A Code Status:  full code Last date of multidisciplinary goals of care discussion [tbd]  Labs       Latest Ref Rng & Units 04/16/2023   12:37 AM 04/15/2023   12:23 AM 04/14/2023    1:08 AM  CMP  Glucose 70 - 99 mg/dL 119  147  829   BUN 8 - 23 mg/dL 49  44  45   Creatinine 0.44 - 1.00 mg/dL 5.62  1.30  8.65   Sodium 135 - 145 mmol/L 140  140  134   Potassium 3.5 - 5.1 mmol/L 4.3  3.3  3.1   Chloride 98 - 111 mmol/L 109  114  103   CO2 22 - 32 mmol/L 20  19  20    Calcium 8.9 - 10.3 mg/dL 8.7  8.6  8.7   Total Protein 6.5 - 8.1 g/dL  5.1  Total Bilirubin 0.3 - 1.2 mg/dL  0.4    Alkaline Phos 38 - 126 U/L  71    AST 15 - 41 U/L  29    ALT 0 - 44 U/L  15         Latest Ref Rng & Units 04/16/2023   12:37 AM 04/15/2023   12:23 AM 04/14/2023    1:08 AM  CBC  WBC 4.0 - 10.5 K/uL 12.6  11.3  15.0   Hemoglobin 12.0 - 15.0 g/dL 21.3  08.6  57.8   Hematocrit 36.0 - 46.0 % 34.5  29.7  31.7   Platelets 150 - 400 K/uL 262  222  269     ABG    Component Value Date/Time   HCO3 17.4 (L) 04/13/2023 1038   TCO2 18 (L) 04/13/2023 1038   ACIDBASEDEF 8.0 (H) 04/13/2023 1038   O2SAT 99 04/13/2023 1038    CBG (last 3)  Recent Labs    04/15/23 2311 04/16/23 0307 04/16/23 0718  GLUCAP 232* 195* 170*    Signature:  Coralyn Helling, MD [facility] Pager - [phone] or [phone] 04/16/2023, 8:38 AM

## 2023-04-16 NOTE — Evaluation (Addendum)
Physical Therapy Evaluation Patient Details Name: Nancy [name] MRN: [id number] DOB: [birth year] Today's Date: 04/16/2023  History of Present Illness  67 yo female brought to ER with altered mental status, agitation and glucose > 600, hypercalcemia, elevated ammonia, lactic acidosis and renal insufficiency.  UDS positive for THC.  She was started on precedex.  Clinical Impression  Pt admitted with above diagnosis. Pt was able to ambulatee with RW with overall good stability. Pt didn't use equipment and lived alone PTA.  Mom in room and supportive and can assist pt at D/C prn.  Pt should progress back to baseline but may benefit from rollator, 3N1 and some HH therapy. Pt continues with some confusion.  Pt currently with functional limitations due to the deficits listed below (see PT Problem List). Pt will benefit from acute skilled PT to increase their independence and safety with mobility to allow discharge.          Recommendations for follow up therapy are one component of a multi-disciplinary discharge planning process, led by the attending physician.  Recommendations may be updated based on patient status, additional functional criteria and insurance authorization.  Follow Up Recommendations       Assistance Recommended at Discharge Intermittent Supervision/Assistance  Patient can return home with the following  A little help with walking and/or transfers;A little help with bathing/dressing/bathroom;Assistance with cooking/housework;Assist for transportation;Help with stairs or ramp for entrance    Equipment Recommendations BSC/3in1;Rollator (4 wheels)  Recommendations for Other Services       Functional Status Assessment Patient has had a recent decline in their functional status and demonstrates the ability to make significant improvements in function in a reasonable and predictable amount of time.     Precautions / Restrictions Precautions Precautions: Fall Precaution Comments:  Rectal pouch, foley Restrictions Weight Bearing Restrictions: No      Mobility  Bed Mobility Overal bed mobility: Needs Assistance Bed Mobility: Supine to Sit     Supine to sit: Supervision     General bed mobility comments: Needed guidance and incr time to come to EOB.    Transfers Overall transfer level: Needs assistance Equipment used: Rolling walker (2 wheels) Transfers: Sit to/from Stand Sit to Stand: Min assist           General transfer comment: Initially needing min assist to steady    Ambulation/Gait Ambulation/Gait assistance: Min guard Gait Distance (Feet): 220 Feet Assistive device: Rolling walker (2 wheels) Gait Pattern/deviations: Step-through pattern, Decreased stride length   Gait velocity interpretation: 1.31 - 2.62 ft/sec, indicative of limited community ambulator   General Gait Details: Overall, steady gait wtih RW. Pt able to withstand some challenges to balance.  Stairs            Wheelchair Mobility    Modified Rankin (Stroke Patients Only)       Balance Overall balance assessment: Needs assistance Sitting-balance support: No upper extremity supported, Feet supported Sitting balance-Leahy Scale: Fair     Standing balance support: Bilateral upper extremity supported, During functional activity Standing balance-Leahy Scale: Poor Standing balance comment: relies on UE support                             Pertinent Vitals/Pain Pain Assessment Pain Assessment: No/denies pain    Home Living Family/patient expects to be discharged to:: Private residence Living Arrangements: Alone Available Help at Discharge: Family;Available 24 hours/day Type of Home: Apartment Home Access: Elevator  Home Layout: One level Home Equipment: None      Prior Function Prior Level of Function : Independent/Modified Independent                     Hand Dominance   Dominant Hand: Right    Extremity/Trunk  Assessment   Upper Extremity Assessment Upper Extremity Assessment: Defer to OT evaluation    Lower Extremity Assessment Lower Extremity Assessment: Generalized weakness    Cervical / Trunk Assessment Cervical / Trunk Assessment: Normal  Communication   Communication: No difficulties  Cognition Arousal/Alertness: Awake/alert Behavior During Therapy: WFL for tasks assessed/performed Overall Cognitive Status: Impaired/Different from baseline Area of Impairment: Orientation, Following commands, Safety/judgement, Awareness, Problem solving                 Orientation Level: Disoriented to, Place, Time, Situation     Following Commands: Follows one step commands with increased time Safety/Judgement: Decreased awareness of safety, Decreased awareness of deficits   Problem Solving: Decreased initiation, Difficulty sequencing, Requires verbal cues, Requires tactile cues          General Comments General comments (skin integrity, edema, etc.): VSS on RA    Exercises     Assessment/Plan    PT Assessment Patient needs continued PT services  PT Problem List Decreased activity tolerance;Decreased balance;Decreased mobility;Decreased knowledge of use of DME;Decreased safety awareness;Decreased knowledge of precautions;Cardiopulmonary status limiting activity       PT Treatment Interventions DME instruction;Gait training;Functional mobility training;Therapeutic activities;Therapeutic exercise;Balance training;Patient/family education    PT Goals (Current goals can be found in the Care Plan section)  Acute Rehab PT Goals Patient Stated Goal: to go home PT Goal Formulation: With patient Time For Goal Achievement: 04/30/23 Potential to Achieve Goals: Good    Frequency Min 3X/week     Co-evaluation               AM-PAC PT "6 Clicks" Mobility  Outcome Measure Help needed turning from your back to your side while in a flat bed without using bedrails?: None Help  needed moving from lying on your back to sitting on the side of a flat bed without using bedrails?: A Little Help needed moving to and from a bed to a chair (including a wheelchair)?: A Little Help needed standing up from a chair using your arms (e.g., wheelchair or bedside chair)?: A Little Help needed to walk in hospital room?: A Little Help needed climbing 3-5 steps with a railing? : A Little 6 Click Score: 19    End of Session Equipment Utilized During Treatment: Gait belt Activity Tolerance: Patient tolerated treatment well Patient left: in chair;with call bell/phone within reach;with chair alarm set Nurse Communication: Mobility status PT Visit Diagnosis: Muscle weakness (generalized) (M62.81)    Time: 1610-9604 PT Time Calculation (min) (ACUTE ONLY): 25 min   Charges:   PT Evaluation $PT Eval Moderate Complexity: 1 Mod and Gait          [facility] M,PT Acute Rehab Services [phone]   Bevelyn Buckles 04/16/2023, 3:44 PM

## 2023-04-16 NOTE — Evaluation (Signed)
Clinical/Bedside Swallow Evaluation Patient Details  Name: Skyllar Tortorice MRN: [id number] Date of Birth: February 11, 1956  Today's Date: 04/16/2023 Time: SLP Start Time (ACUTE ONLY): 0930 SLP Stop Time (ACUTE ONLY): 4098 SLP Time Calculation (min) (ACUTE ONLY): 8 min  Past Medical History:  Past Medical History:  Diagnosis Date   Carpal tunnel syndrome, bilateral    confirmed on nerve conduction studies   Chronic hepatitis C (HCC)    considered cured 06/2016 post retroviral therapy    Diabetes mellitus type 2, uncontrolled    Diabetic peripheral neuropathy (HCC)    GERD (gastroesophageal reflux disease)    History of endometriosis    History of Helicobacter infection 11/07   Hyperlipidemia    Liver fibrosis 02/17/2016   F2/3    Past Surgical History:  Past Surgical History:  Procedure Laterality Date   ABDOMINAL HYSTERECTOMY     CARPAL TUNNEL RELEASE     HPI:  67 yo female brought to ER with altered mental status, agitation and glucose > 600, hypercalcemia, elevated ammonia, lactic acidosis and renal insufficiency.  UDS positive for THC.  She was started on precedex.    Assessment / Plan / Recommendation  Clinical Impression  Pt demonstrates no signs of oropharyngeal dysphagia. Mental status appropriate for self feeding, pt was not intubation so there are few risk factors for aspiration. Recommend pt resumed a regular diet and thin liquids. SLP will sign off. SLP Visit Diagnosis: Dysphagia, unspecified (R13.10)    Aspiration Risk  Mild aspiration risk    Diet Recommendation Regular;Thin liquid   Liquid Administration via: Cup;Straw Medication Administration: Whole meds with liquid Postural Changes: Seated upright at 90 degrees    Other  Recommendations      Recommendations for follow up therapy are one component of a multi-disciplinary discharge planning process, led by the attending physician.  Recommendations may be updated based on patient status, additional functional  criteria and insurance authorization.  Follow up Recommendations        Assistance Recommended at Discharge    Functional Status Assessment    Frequency and Duration            Prognosis        Swallow Study   General HPI: 67 yo female brought to ER with altered mental status, agitation and glucose > 600, hypercalcemia, elevated ammonia, lactic acidosis and renal insufficiency.  UDS positive for THC.  She was started on precedex. Type of Study: Bedside Swallow Evaluation Previous Swallow Assessment: none Diet Prior to this Study: NPO Temperature Spikes Noted: No Respiratory Status: Room air History of Recent Intubation: No Behavior/Cognition: Alert;Cooperative;Pleasant mood Oral Cavity Assessment: Within Functional Limits Oral Care Completed by SLP: No Oral Cavity - Dentition: Adequate natural dentition Vision: Functional for self-feeding Self-Feeding Abilities: Able to feed self Patient Positioning: Upright in bed Baseline Vocal Quality: Normal Volitional Cough: Strong Volitional Swallow: Able to elicit    Oral/Motor/Sensory Function Overall Oral Motor/Sensory Function: Within functional limits   Ice Chips     Thin Liquid Thin Liquid: Within functional limits    Nectar Thick Nectar Thick Liquid: Not tested   Honey Thick Honey Thick Liquid: Not tested   Puree Puree: Within functional limits   Solid     Solid: Within functional limits Presentation: Self Fed      Tavone Caesar, Riley Nearing 04/16/2023,9:58 AM

## 2023-04-17 ENCOUNTER — Inpatient Hospital Stay (HOSPITAL_COMMUNITY): Payer: 59

## 2023-04-17 DIAGNOSIS — Z0389 Encounter for observation for other suspected diseases and conditions ruled out: Secondary | ICD-10-CM | POA: Diagnosis not present

## 2023-04-17 DIAGNOSIS — G9341 Metabolic encephalopathy: Secondary | ICD-10-CM | POA: Diagnosis not present

## 2023-04-17 LAB — BASIC METABOLIC PANEL
Anion gap: 13 (ref 5–15)
BUN: 46 mg/dL — ABNORMAL HIGH (ref 8–23)
CO2: 21 mmol/L — ABNORMAL LOW (ref 22–32)
Calcium: 8.9 mg/dL (ref 8.9–10.3)
Chloride: 108 mmol/L (ref 98–111)
Creatinine, Ser: 4.95 mg/dL — ABNORMAL HIGH (ref 0.44–1.00)
GFR, Estimated: 9 mL/min — ABNORMAL LOW (ref >60)
Glucose, Bld: 89 mg/dL (ref 70–99)
Potassium: 3.3 mmol/L — ABNORMAL LOW (ref 3.5–5.1)
Sodium: 142 mmol/L (ref 135–145)

## 2023-04-17 LAB — GLUCOSE, CAPILLARY
Glucose-Capillary: 95 mg/dL (ref 70–99)
Glucose-Capillary: 116 mg/dL — ABNORMAL HIGH (ref 70–99)
Glucose-Capillary: 124 mg/dL — ABNORMAL HIGH (ref 70–99)
Glucose-Capillary: 234 mg/dL — ABNORMAL HIGH (ref 70–99)
Glucose-Capillary: 183 mg/dL — ABNORMAL HIGH (ref 70–99)
Glucose-Capillary: 207 mg/dL — ABNORMAL HIGH (ref 70–99)

## 2023-04-17 MED ORDER — INSULIN ASPART 100 UNIT/ML IJ SOLN
0.0000 [IU] | Freq: Every day | INTRAMUSCULAR | Status: DC
  Administered 2023-04-17: 2 [IU] via SUBCUTANEOUS

## 2023-04-17 MED ORDER — DOCUSATE SODIUM 50 MG/5ML PO LIQD: 100.0000 mg | Freq: Two times a day (BID) | ORAL | Status: DC | PRN

## 2023-04-17 MED ORDER — INSULIN GLARGINE-YFGN 100 UNIT/ML ~~LOC~~ SOLN
18.0000 [IU] | Freq: Two times a day (BID) | SUBCUTANEOUS | Status: DC
  Administered 2023-04-17: 18 [IU] via SUBCUTANEOUS
  Filled 2023-04-17 (×3): qty 0.18

## 2023-04-17 MED ORDER — AMLODIPINE BESYLATE 10 MG PO TABS
10.0000 mg | ORAL_TABLET | Freq: Every day | ORAL | Status: DC
  Administered 2023-04-17: 10 mg via ORAL
  Filled 2023-04-17: qty 1

## 2023-04-17 MED ORDER — POTASSIUM CHLORIDE 20 MEQ PO PACK
40.0000 meq | PACK | Freq: Once | ORAL | Status: AC
  Administered 2023-04-17: 40 meq via ORAL
  Filled 2023-04-17: qty 2

## 2023-04-17 MED ORDER — ACETAMINOPHEN 325 MG PO TABS: 650.0000 mg | ORAL_TABLET | ORAL | Status: DC | PRN

## 2023-04-17 MED ORDER — AMLODIPINE BESYLATE 10 MG PO TABS: 10.0000 mg | ORAL_TABLET | Freq: Every day | ORAL | Status: DC

## 2023-04-17 MED ORDER — DOCUSATE SODIUM 100 MG PO CAPS: 100.0000 mg | ORAL_CAPSULE | Freq: Two times a day (BID) | ORAL | Status: DC | PRN

## 2023-04-17 MED ORDER — HYDRALAZINE HCL 50 MG PO TABS
50.0000 mg | ORAL_TABLET | Freq: Three times a day (TID) | ORAL | Status: DC
  Administered 2023-04-17 – 2023-04-18 (×2): 50 mg via ORAL
  Filled 2023-04-17 (×2): qty 1

## 2023-04-17 MED ORDER — POLYETHYLENE GLYCOL 3350 17 G PO PACK: 17.0000 g | PACK | Freq: Every day | ORAL | Status: DC | PRN

## 2023-04-17 MED ORDER — INSULIN ASPART 100 UNIT/ML IJ SOLN
0.0000 [IU] | Freq: Three times a day (TID) | INTRAMUSCULAR | Status: DC
  Administered 2023-04-17: 7 [IU] via SUBCUTANEOUS
  Administered 2023-04-18: 15 [IU] via SUBCUTANEOUS
  Administered 2023-04-18: 7 [IU] via SUBCUTANEOUS
  Administered 2023-04-18: 11 [IU] via SUBCUTANEOUS
  Administered 2023-04-19: 7 [IU] via SUBCUTANEOUS

## 2023-04-17 MED ORDER — ACETAMINOPHEN 325 MG PO TABS
650.0000 mg | ORAL_TABLET | ORAL | Status: DC | PRN
  Administered 2023-04-19 – 2023-04-20 (×4): 650 mg via ORAL
  Filled 2023-04-17 (×6): qty 2

## 2023-04-17 MED ORDER — FUROSEMIDE 40 MG PO TABS
40.0000 mg | ORAL_TABLET | Freq: Once | ORAL | Status: AC
  Administered 2023-04-17: 40 mg via ORAL
  Filled 2023-04-17: qty 1

## 2023-04-17 MED ORDER — AMLODIPINE BESYLATE 10 MG PO TABS
10.0000 mg | ORAL_TABLET | Freq: Every day | ORAL | Status: DC
  Administered 2023-04-18 – 2023-04-21 (×4): 10 mg via ORAL
  Filled 2023-04-17 (×4): qty 1

## 2023-04-17 MED ORDER — HYDRALAZINE HCL 50 MG PO TABS: 50.0000 mg | ORAL_TABLET | Freq: Three times a day (TID) | ORAL | Status: DC

## 2023-04-17 MED ORDER — LACTULOSE 10 GM/15ML PO SOLN
10.0000 g | Freq: Three times a day (TID) | ORAL | Status: DC
  Administered 2023-04-17 – 2023-04-18 (×2): 10 g
  Filled 2023-04-17 (×2): qty 15

## 2023-04-17 MED ORDER — MELATONIN 5 MG PO TABS
5.0000 mg | ORAL_TABLET | Freq: Once | ORAL | Status: AC | PRN
  Administered 2023-04-17: 5 mg via ORAL
  Filled 2023-04-17: qty 1

## 2023-04-17 MED ORDER — HYDRALAZINE HCL 50 MG PO TABS
50.0000 mg | ORAL_TABLET | Freq: Three times a day (TID) | ORAL | Status: DC
  Administered 2023-04-17: 50 mg via ORAL
  Filled 2023-04-17: qty 1

## 2023-04-17 MED ORDER — LACTULOSE 10 GM/15ML PO SOLN
10.0000 g | Freq: Three times a day (TID) | ORAL | Status: DC
  Administered 2023-04-17: 10 g via ORAL
  Filled 2023-04-17: qty 15

## 2023-04-17 NOTE — Progress Notes (Signed)
eLink Physician-Brief Progress Note Patient Name: Nancy [name] DOB: 20-Nov-1956 MRN: [id number]   Date of Service  04/17/2023  HPI/Events of Note  Patient is requesting something to help her sleep. On review she does not take anything at home to aid with sleep Creatinine 4.95, LFTs normal on admission  Seen standing up at the side of the bed and was assisted back to bed  eICU Interventions  Ordered melatonin 5 mg PO for tonight     Intervention Category Minor Interventions: Routine modifications to care plan (e.g. PRN medications for pain, fever)  Rosalie Gums Kynzlee Hucker 04/17/2023, 8:49 PM

## 2023-04-17 NOTE — Progress Notes (Addendum)
NAME:  Nancy [name], MRN:  [id number], DOB:  Mar 10, 1956, LOS: 4 ADMISSION DATE:  04/13/2023, CONSULTATION DATE: 04/13/2023 REFERRING MD: Emergency department physician, CHIEF COMPLAINT: Altered mental status, hyperglycemia acute on chronic renal insufficiency  History of Present Illness:  67 yo female brought to ER with altered mental status, agitation and glucose > 600, hypercalcemia, elevated ammonia, lactic acidosis and renal insufficiency.  UDS positive for THC.  She was started on precedex.  Pertinent  Medical History  Carpal tunnel, Hep C, DM type 2 with neuropathy, GERD, Endometriosis, HLD  Significant Hospital Events: Including procedures, antibiotic start and stop dates in addition to other pertinent events   5/07 Admit, start precedex, CT head normal 5/10 More calm this morning.  Weaning precedex.  Wants something to drink and wants to speak with her mother. 5/11 no acute issues overnight, states she feels well.  However it is observed that she is having significant difficulty finding her worse this morning, she reports this is not her normal baseline.  Of note head CT on admission negative  Interim History / Subjective:  States she feels well this a.m. but is having difficulty finding words  Objective   Blood pressure (!) 158/112, pulse (!) 104, temperature 99.3 F (37.4 C), temperature source Oral, resp. rate (!) 22, height 4\' 9"  (1.448 m), weight 63.3 kg, last menstrual period 02/15/2009, SpO2 96 %.        Intake/Output Summary (Last 24 hours) at 04/17/2023 0829 Last data filed at 04/17/2023 0500 Gross per 24 hour  Intake 339.95 ml  Output 2778 ml  Net -2438.05 ml    Filed Weights   04/15/23 0500 04/16/23 0500 04/17/23 0500  Weight: 65.8 kg 66.2 kg 63.3 kg    Examination: General: Acute on chronic ill-appearing middle-aged female lying in bed in no acute distress HEENT: [place]/AT, MM pink/moist, PERRL,  Neuro: Alert and oriented x 3, anomic aphasia, CV: s1s2 regular  rate and rhythm, no murmur, rubs, or gallops,  PULM: Clear to auscultation bilaterally, no increased work of breathing, on room air GI: soft, bowel sounds active in all 4 quadrants, non-tender, non-distended Extremities: warm/dry, no edema  Skin: no rashes or lesions  Resolved Hospital Problem list   Lactic acidosis Dysphagia Agitated delirium HHS Elevated ammonia  Assessment & Plan:   Anomic aphasia -On a.m. assessment 5/11 it is observed that patient is having significant difficulty finding words with observed frustration.  She reports this is not her typical baseline P: Obtain MRI brain  Correct metabolic derangements Optimize hemodynamics Minimize sedation Delirium precautions  DM type 2 poorly controlled with hyperglycemia -Hemoglobin A1c 9.08 February 2023 P: Continue SSI CBG goal 140-180 CBG checks every 4  AKI superimposed on CKD 4 - baseline creatinine 2.32 from 05/14/22, creatinine on admission 5.02 P: Follow renal function  Monitor urine output Trend Bmet Avoid nephrotoxins Ensure adequate renal perfusion   Anemia of critical illness and chronic disease P: Transfuse per protocol Hemoglobin goal greater than 7 Monitor for signs of bleeding  Hx of HTN, HLD -Per chart review patient is not on any standing antihypertensive medications but is utilizing statin at baseline P: Increase Norvasc Add p.o. hydralazine As needed IV antihypertensives Continue oral statin Diurese today  Elevated ammonia level  Hx of hepatitis C P: Decreased lactulose given high stool burden and normalized ammonia  Check ABD [place]  Repeat Hepatitis panel   Hypokalemia P: Supplement Trend Bment  Best Practice (right click and "Reselect all SmartList Selections" daily)  Diet/type: tubefeeds DVT prophylaxis: not indicated GI prophylaxis: PPI Lines: N/A Foley:  N/A Code Status:  full code Last date of multidisciplinary goals of care discussion: Continue to update patient and  family daily  Signature:  Gianpaolo Mindel D. Harris, NP-C [facility] Personal contact information can be found on Amion  If no contact or response made please call [phone] 04/17/2023, 8:31 AM

## 2023-04-18 ENCOUNTER — Other Ambulatory Visit (HOSPITAL_COMMUNITY): Payer: 59

## 2023-04-18 DIAGNOSIS — G9341 Metabolic encephalopathy: Secondary | ICD-10-CM | POA: Diagnosis not present

## 2023-04-18 LAB — GLUCOSE, CAPILLARY
Glucose-Capillary: 346 mg/dL — ABNORMAL HIGH (ref 70–99)
Glucose-Capillary: 229 mg/dL — ABNORMAL HIGH (ref 70–99)
Glucose-Capillary: 216 mg/dL — ABNORMAL HIGH (ref 70–99)
Glucose-Capillary: 256 mg/dL — ABNORMAL HIGH (ref 70–99)
Glucose-Capillary: 133 mg/dL — ABNORMAL HIGH (ref 70–99)

## 2023-04-18 LAB — COMPREHENSIVE METABOLIC PANEL
ALT: 21 U/L (ref 0–44)
AST: 24 U/L (ref 15–41)
Albumin: 2.3 g/dL — ABNORMAL LOW (ref 3.5–5.0)
Alkaline Phosphatase: 77 U/L (ref 38–126)
Anion gap: 12 (ref 5–15)
BUN: 46 mg/dL — ABNORMAL HIGH (ref 8–23)
CO2: 23 mmol/L (ref 22–32)
Calcium: 8.1 mg/dL — ABNORMAL LOW (ref 8.9–10.3)
Chloride: 102 mmol/L (ref 98–111)
Creatinine, Ser: 4.87 mg/dL — ABNORMAL HIGH (ref 0.44–1.00)
GFR, Estimated: 9 mL/min — ABNORMAL LOW (ref >60)
Glucose, Bld: 204 mg/dL — ABNORMAL HIGH (ref 70–99)
Potassium: 3.8 mmol/L (ref 3.5–5.1)
Sodium: 137 mmol/L (ref 135–145)
Total Bilirubin: 0.6 mg/dL (ref 0.3–1.2)
Total Protein: 6.3 g/dL — ABNORMAL LOW (ref 6.5–8.1)

## 2023-04-18 LAB — CBC
HCT: 35.1 % — ABNORMAL LOW (ref 36.0–46.0)
Hemoglobin: 11.4 g/dL — ABNORMAL LOW (ref 12.0–15.0)
MCH: 27.1 pg (ref 26.0–34.0)
MCHC: 32.5 g/dL (ref 30.0–36.0)
MCV: 83.6 fL (ref 80.0–100.0)
Platelets: 288 10*3/uL (ref 150–400)
RBC: 4.2 MIL/uL (ref 3.87–5.11)
RDW: 14.2 % (ref 11.5–15.5)
WBC: 14.3 10*3/uL — ABNORMAL HIGH (ref 4.0–10.5)
nRBC: 0 % (ref 0.0–0.2)

## 2023-04-18 LAB — HEPATITIS PANEL, ACUTE
HCV Ab: REACTIVE — AB
Hep A IgM: NONREACTIVE
Hep B C IgM: NONREACTIVE
Hepatitis B Surface Ag: NONREACTIVE

## 2023-04-18 MED ORDER — HYDRALAZINE HCL 50 MG PO TABS
100.0000 mg | ORAL_TABLET | Freq: Three times a day (TID) | ORAL | Status: DC
  Administered 2023-04-18 – 2023-04-21 (×10): 100 mg via ORAL
  Filled 2023-04-18 (×10): qty 2

## 2023-04-18 MED ORDER — CYCLOBENZAPRINE HCL 5 MG PO TABS
5.0000 mg | ORAL_TABLET | Freq: Once | ORAL | Status: AC
  Administered 2023-04-18: 5 mg via ORAL
  Filled 2023-04-18: qty 1

## 2023-04-18 MED ORDER — GLUCERNA SHAKE PO LIQD
237.0000 mL | Freq: Two times a day (BID) | ORAL | Status: DC
  Administered 2023-04-18 (×2): 237 mL via ORAL

## 2023-04-18 MED ORDER — INSULIN GLARGINE-YFGN 100 UNIT/ML ~~LOC~~ SOLN
22.0000 [IU] | Freq: Two times a day (BID) | SUBCUTANEOUS | Status: DC
  Administered 2023-04-18 (×2): 22 [IU] via SUBCUTANEOUS
  Filled 2023-04-18 (×4): qty 0.22

## 2023-04-18 MED ORDER — DIPHENHYDRAMINE HCL 25 MG PO CAPS
50.0000 mg | ORAL_CAPSULE | Freq: Once | ORAL | Status: AC | PRN
  Administered 2023-04-18: 50 mg via ORAL
  Filled 2023-04-18: qty 2

## 2023-04-18 MED ORDER — GLUCERNA 1.5 CAL PO LIQD
1000.0000 mL | ORAL | Status: DC
  Administered 2023-04-18 (×2): 1000 mL
  Filled 2023-04-18 (×4): qty 1000

## 2023-04-18 MED ORDER — CYCLOBENZAPRINE HCL 5 MG PO TABS
5.0000 mg | ORAL_TABLET | Freq: Three times a day (TID) | ORAL | Status: DC | PRN
  Administered 2023-04-18 – 2023-04-20 (×3): 5 mg via ORAL
  Filled 2023-04-18 (×4): qty 1

## 2023-04-18 NOTE — Progress Notes (Signed)
eLink Physician-Brief Progress Note Patient Name: Nancy [name] DOB: 1956/06/23 MRN: [id number]   Date of Service  04/18/2023  HPI/Events of Note  Remains awake despite melatoin  eICU Interventions  Ordered a dose of Benadryl 50 mg PO     Intervention Category Minor Interventions: Routine modifications to care plan (e.g. PRN medications for pain, fever)  Rosalie Gums Braycen Burandt 04/18/2023, 1:47 AM

## 2023-04-18 NOTE — Progress Notes (Addendum)
Nutrition Follow-up RD working remotely.  DOCUMENTATION CODES:   Non-severe (moderate) malnutrition in context of chronic illness  INTERVENTION:  - started 48 hour Calorie Count; shared with RN through secure chat.  - ordered Glucerna Shake BID, each supplement provides 220 kcal and 10 grams of protein.  - adjusted TF regimen to aid in appetite and PO intakes: Glucerna 1.5 @ 75 ml/hr x16 hours/day (1600-0800) to provide 1800 kcal, 99 grams protein, 19 grams fiber, 911 ml water.  - initiate Glucerna 1.5 @ 50 ml/hr at 1600 on 5/12. Increase by 10 ml/hr every 4 hours of run time to reach goal rate of 75 ml/hr. Can initiate at goal rate of 75 ml/hr on 5/13 and moving forward.  - free water flush, if desired, to be per CCM team.   NUTRITION DIAGNOSIS:   Moderate Malnutrition related to chronic illness (CKD stage IV, hepatitis C) as evidenced by mild muscle depletion, percent weight loss (8.6% weight loss in 3 months). -ongoing  GOAL:   Patient will meet greater than or equal to 90% of their needs - currently met with TF regimen  MONITOR:   PO intake, TF tolerance, Labs, Weight trends  REASON FOR ASSESSMENT:   Consult Assessment of nutrition requirement/status (Patient has been cleared for oral diet but nursing is concerned for inadequate p.o. intake.  May need to consider calorie count?)  ASSESSMENT:   67 year old female who presented to the ED on 5/07 with AMS. PMH of T2DM, CKD stage IV, hepatitis C, GERD, HLD. Pt admitted with HHS and AKI.  Patient assessed in person on 5/11. [name] remains in place in R nare and patient is receiving Glucerna 1.5 @ 50 ml/hr as of 0600 today. This regimen is providing 1800 kcal, 99 grams protein, 19 grams fiber, and 911 ml water; meeting 100% estimated kcal and protein needs. No order in place for free water flush.  Diet advanced from NPO to Regular, thin liquids on 5/10 at ~1000. Per flow sheet documentation, she ate 0% at breakfast and lunch  on 5/11.   Weight on 5/8 was 145 lb and today is 140 lb. On 5/11 AM and PM she was noted to have generalized non-pitting edema, per documentation in the flow sheet.   Notes indicate patient is able to answer simple questions though has word finding difficulty. She was noted to be a/o x4 as of 2000 on 5/11.   Labs reviewed; BUN: 46 mg/dl, creatinine: 2.95 mg/dl, Ca: 8.1 mg/dl, GFR: 9 ml/min.  Medications reviewed; sliding scale novolog, 18 units semglee BID, 10 g lactulose TID, 40 mEq Klor-Con x1 dose 5/11, 40 mg oral lasix x1 dose 5/11.  Diet Order:   Diet Order             Diet regular Room service appropriate? Yes with Assist; Fluid consistency: Thin  Diet effective now                   EDUCATION NEEDS:   Not appropriate for education at this time  Skin:  Skin Assessment: Reviewed RN Assessment  Last BM:  5/11 (type 6 x2, small amounts; type 7 x1, small amount)  Height:   Ht Readings from Last 1 Encounters:  04/13/23 4\' 9"  (1.448 m)    Weight:   Wt Readings from Last 1 Encounters:  04/18/23 63.7 kg    BMI:  Body mass index is 30.39 kg/m.  Estimated Nutritional Needs:  Kcal:  1700-1900 Protein:  85-100 grams Fluid:  1.7-1.9 L  Jarome Matin, MS, RD, LDN, CNSC Clinical Dietitian PRN/Relief staff On-call/weekend pager # available in [facility]

## 2023-04-18 NOTE — Progress Notes (Signed)
Pt is not eating meals. Eats a couple bites of ice cream. When asked why patient does not eat, she states, "It doesn't taste good so I don't want to eat it." Pt was educated that NGT will stay in until she can eat enough real food because we want her to meet her caloric needs. Pt expressed understanding. TF remain on with glucerna supplements between meals.

## 2023-04-18 NOTE — Progress Notes (Signed)
Pt transferred to 5M05 no issues. VSS. Mom and uncle at bedside at time of transfer. Attempted to call Jonny Ruiz but no answer. Pt updated.

## 2023-04-18 NOTE — Progress Notes (Addendum)
NAME:  Nancy [name], MRN:  [id number], DOB:  [birth year], LOS: 5 ADMISSION DATE:  04/13/2023, CONSULTATION DATE: 04/13/2023 REFERRING MD: Emergency department physician, CHIEF COMPLAINT: Altered mental status, hyperglycemia acute on chronic renal insufficiency  History of Present Illness:  67 yo female brought to ER with altered mental status, agitation and glucose > 600, hypercalcemia, elevated ammonia, lactic acidosis and renal insufficiency.  UDS positive for THC.  She was started on precedex.  Pertinent  Medical History  Carpal tunnel, Hep C, DM type 2 with neuropathy, GERD, Endometriosis, HLD  Significant Hospital Events: Including procedures, antibiotic start and stop dates in addition to other pertinent events   5/07 Admit, start precedex, CT head normal 5/10 More calm this morning.  Weaning precedex.  Wants something to drink and wants to speak with her mother. 5/11 no acute issues overnight, states she feels well.  However it is observed that she is having significant difficulty finding her worse this morning, she reports this is not her normal baseline.  Of note head CT on admission negative 5/12 no issues overnight, anomic aphasia much improved this morning.   Interim History / Subjective:  States she feels well and it is "easier to find my words today".  She is requesting to have core track removed and eat breakfast  Objective   Blood pressure (!) 154/107, pulse (!) 104, temperature 98.4 F (36.9 C), temperature source Oral, resp. rate (!) 26, height 4\' 9"  (1.448 m), weight 63.7 kg, last menstrual period 02/15/2009, SpO2 98 %.        Intake/Output Summary (Last 24 hours) at 04/18/2023 0746 Last data filed at 04/18/2023 0600 Gross per 24 hour  Intake 910.16 ml  Output 1725 ml  Net -814.84 ml    Filed Weights   04/16/23 0500 04/17/23 0500 04/18/23 0316  Weight: 66.2 kg 63.3 kg 63.7 kg    Examination: General: Very pleasant chronic ill-appearing middle-aged female  lying in bed in no acute distress HEENT: [place]/AT, MM pink/moist, PERRL,  Neuro: Alert and oriented x 3, nonfocal, slight delay in finding some words CV: s1s2 regular rate and rhythm, no murmur, rubs, or gallops,  PULM: Clear to auscultation bilaterally, no increased work of breathing, no added breath sounds GI: soft, bowel sounds active in all 4 quadrants, non-tender, non-distended, tolerating TF Extremities: warm/dry, no edema  Skin: no rashes or lesions  Resolved Hospital Problem list   Lactic acidosis Dysphagia Agitated delirium HHS Elevated ammonia  Assessment & Plan:   Anomic aphasia -On a.m. assessment 5/11 it is observed that patient is having significant difficulty finding words with observed frustration.  She reports this is not her typical baseline. -MRI brain obtained overnight 5/12 with no acute intracranial abnormality seen -5/12 aphasia much improved P: Minimize sedation Delirium precautions Continue to optimize metabolic derangements Transfer out of ICU Encourage sleep-wake cycle Continue to follow renal function as below  DM type 2 poorly controlled with hyperglycemia -Hemoglobin A1c 9.08 February 2023 P: Continue SSI and at bedtime coverage CBG goal 140-180 Increase long-acting insulin CBG checks every 4  AKI superimposed on CKD 4 - baseline creatinine 2.32 with GFR 23 from 05/14/22, creatinine on admission 5.02 -Per D per chart review patient was evaluated at [facility] for renal [facility] February of last year.  Per chart if GFR dropped below 20 she could consider further transplant workup P: Continue to try to optimize renal function, currently making good amount of urine Verbally discussed patient's clinical status with nephrology a.m. of  5/12 with no acute indications for renal placement therapy Follow renal function closely Trend [place] Avoid nephrotoxins Ensure adequate renal perfusion Upon discharge would benefit from repeat evaluation per  transplant team  Anemia of critical illness and chronic disease P: Transfuse per protocol Hemoglobin goal greater than 7 Monitor for signs of bleeding  Hx of HTN, HLD -Per chart review patient is not on any standing antihypertensive medications but is utilizing statin at baseline P: Continue Norvasc, increase hydralazine As needed IV antihypertensives Continue oral statin Daily evaluation to diurese, hold today Follow-up echocardiogram  Elevated ammonia level  Hx of hepatitis C -HCV Ab reactive 5/20 -ABD ultrasound unremarkable, sonographic signs of medical renal disease P: Stop lactulose with close monitoring of ammonia levels Check HCV RNA  Hypokalemia P: Supplement Trend Bment  Moderate malnutrition P: Core track remains in place with tube feeds Advance diet as able Calorie count in place   Stable for transfer out of the ICU and to hospitalist service in a.m.  Best Practice (right click and "Reselect all SmartList Selections" daily)   Diet/type: tubefeeds DVT prophylaxis: not indicated GI prophylaxis: PPI Lines: N/A Foley:  N/A Code Status:  full code Last date of multidisciplinary goals of care discussion: Continue to update patient and family daily  Signature:  Mertie Haslem D. Harris, NP-C [facility] Personal contact information can be found on Amion  If no contact or response made please call [phone] 04/18/2023, 7:46 AM

## 2023-04-18 NOTE — Progress Notes (Signed)
PT was transported to and from MRI with this nurse. No complications.  Beatrix Shipper

## 2023-04-19 ENCOUNTER — Inpatient Hospital Stay (HOSPITAL_COMMUNITY): Payer: 59

## 2023-04-19 DIAGNOSIS — R739 Hyperglycemia, unspecified: Secondary | ICD-10-CM

## 2023-04-19 LAB — GLUCOSE, CAPILLARY
Glucose-Capillary: 285 mg/dL — ABNORMAL HIGH (ref 70–99)
Glucose-Capillary: 215 mg/dL — ABNORMAL HIGH (ref 70–99)
Glucose-Capillary: 96 mg/dL (ref 70–99)
Glucose-Capillary: 97 mg/dL (ref 70–99)
Glucose-Capillary: 130 mg/dL — ABNORMAL HIGH (ref 70–99)
Glucose-Capillary: 68 mg/dL — ABNORMAL LOW (ref 70–99)
Glucose-Capillary: 164 mg/dL — ABNORMAL HIGH (ref 70–99)

## 2023-04-19 LAB — ECHOCARDIOGRAM COMPLETE
Area-P 1/2: 3.91 cm2
Height: 57 in
S' Lateral: 1.7 cm
Single Plane A2C EF: 76.8 %
Weight: 2458.57 oz

## 2023-04-19 LAB — BASIC METABOLIC PANEL
Anion gap: 12 (ref 5–15)
BUN: 54 mg/dL — ABNORMAL HIGH (ref 8–23)
CO2: 24 mmol/L (ref 22–32)
Calcium: 7.4 mg/dL — ABNORMAL LOW (ref 8.9–10.3)
Chloride: 97 mmol/L — ABNORMAL LOW (ref 98–111)
Creatinine, Ser: 4.92 mg/dL — ABNORMAL HIGH (ref 0.44–1.00)
GFR, Estimated: 9 mL/min — ABNORMAL LOW (ref >60)
Glucose, Bld: 285 mg/dL — ABNORMAL HIGH (ref 70–99)
Potassium: 4.3 mmol/L (ref 3.5–5.1)
Sodium: 133 mmol/L — ABNORMAL LOW (ref 135–145)

## 2023-04-19 MED ORDER — INSULIN GLARGINE-YFGN 100 UNIT/ML ~~LOC~~ SOLN
25.0000 [IU] | Freq: Two times a day (BID) | SUBCUTANEOUS | Status: DC
  Filled 2023-04-19 (×2): qty 0.25

## 2023-04-19 MED ORDER — ONDANSETRON HCL 4 MG/2ML IJ SOLN
4.0000 mg | Freq: Four times a day (QID) | INTRAMUSCULAR | Status: DC | PRN
  Administered 2023-04-19: 4 mg via INTRAVENOUS
  Filled 2023-04-19 (×3): qty 2

## 2023-04-19 MED ORDER — INSULIN GLARGINE-YFGN 100 UNIT/ML ~~LOC~~ SOLN
15.0000 [IU] | Freq: Two times a day (BID) | SUBCUTANEOUS | Status: DC
  Filled 2023-04-19 (×4): qty 0.15

## 2023-04-19 MED ORDER — INSULIN ASPART 100 UNIT/ML IJ SOLN
0.0000 [IU] | INTRAMUSCULAR | Status: DC
  Administered 2023-04-19: 3 [IU] via SUBCUTANEOUS

## 2023-04-19 MED ORDER — SODIUM CHLORIDE 0.9 % IV SOLN: INTRAVENOUS | Status: DC

## 2023-04-19 NOTE — Progress Notes (Signed)
Mobility Specialist Progress Note   04/19/23 1211  Mobility  Activity Ambulated with assistance in hallway  Level of Assistance Contact guard assist, steadying assist  Assistive Device Front wheel walker  Distance Ambulated (ft) 74 ft (49ft+38ft)  Activity Response Tolerated well  Mobility Referral Yes  $Mobility charge 1 Mobility  Mobility Specialist Start Time (ACUTE ONLY) 1211  Mobility Specialist Stop Time (ACUTE ONLY) 1240  Mobility Specialist Time Calculation (min) (ACUTE ONLY) 29 min   During Mobility: 98 HR, 129/77 BP  Received pt in bed c/o feeling weak but agreeable to mobility. Able to get EOB and stand w/ standby assist but CGA during ambulation d/t onset dizziness. X1 seated break d/t increasing dizziness but BP stable. Returned back to room w/ dizziness subsiding once sitting EOB. Left in bed w/ call bell in reach and needs met.   Frederico Hamman Mobility Specialist Please contact via SecureChat or  Rehab office at [phone]

## 2023-04-19 NOTE — Progress Notes (Signed)
Nutrition Brief Note  Visited patient at bedside who has cortrak in place. TF's were turned off due to intolerance. Patient found to have an ileus and made NPO. She reports feeling very hungry and was saddened when she learned she cannot eat right now.   Patient did not tolerate glucerna 1.5 @75  ml/hr. She reported abd pain and gassiness. Patient has watermelon at bedside and was advised not to eat it.   RD s/m'd attending about removing cortrak tube which he is OK with. RD to d/c calorie count while patient is NPO and will restart when she is on a po diet again. RN to remove cortrak feeding tube today at bedside.   Leodis Rains, RDN, LDN  Clinical Nutrition

## 2023-04-19 NOTE — Progress Notes (Signed)
PROGRESS NOTE    Nancy [name]  ZOX:[id number] DOB: 28-May-1956 DOA: 04/13/2023 PCP: Georgina Quint, MD   Brief Narrative:  67 year old female with DM2, hepatitis C, endometriosis, GERD who presented with altered mental status, hyperglycemia, hyperammonemia, lactic acidosis and renal insufficiency. UDS + THC. Admitted on 5/7 and started on precedex with normal CT head.  Improved.  Transferred to floor under TRH on 04/19/2023.  Assessment & Plan:   Principal Problem:   Hyperglycemia Active Problems:   Malnutrition of moderate degree  Anomic aphasia -On a.m. assessment 5/11 it is observed that patient is having significant difficulty finding words with observed frustration.  She reports this is not her typical baseline. -MRI brain obtained overnight 5/12 with no acute intracranial abnormality seen -5/12 aphasia much improved.  Poorly controlled type 2 diabetes mellitus with hyperglycemia: Hemoglobin A1c 9.4 in March 2024.  Blood sugar slightly elevated but since she is going to be n.p.o., I am actually going to reduce her Semglee from 25 units twice daily to 15 units twice daily and continue SSI.  AKI on CKD stage IV: Baseline creatinine appears to be 2.3 to.  Creatinine on admission 5.02.  Only slightly improved. Per per chart review patient was evaluated at [facility] for renal transplant February of last year.  Has good urine output.  Continue to avoid nephrotoxic agents.  Repeat labs in the morning, if no improvement, will likely consult nephrology.  Start on IV fluids and check ultrasound renal.  Moderate malnutrition: Patient was started on tube feedings through [employer]tra[employer] but earlier this morning, she complained of abdominal pain and tube feeds were stopped.  Ileus: Complaint of abdominal pain earlier this morning, abdominal x-ray shows ileus.  She is complaining of generalized abdominal pain and has generalized tenderness, although soft on examination.  Will keep her  n.p.o. with only ice chips and hold tube feeds for now.  Await for return of the bowel function.  Reassess tomorrow morning.  IV fluids.  Essential hypertension: Slightly elevated.  Continue home medications including amlodipine, hydralazine.  DVT prophylaxis: heparin injection 5,000 Units Start: 04/15/23 1015 SCDs Start: 04/13/23 1220   Code Status: Full Code  Family Communication: Husband present at bedside.  Plan of care discussed with patient in length and he/she verbalized understanding and agreed with it.  Status is: Inpatient Remains inpatient appropriate because: Now has ileus and still has elevated creatinine.   Estimated body mass index is 33.25 kg/m as calculated from the following:   Height as of this encounter: 4\' 9"  (1.448 m).   Weight as of this encounter: 69.7 kg.    Nutritional Assessment: Body mass index is 33.25 kg/m.Marland Kitchen Seen by dietician.  I agree with the assessment and plan as outlined below: Nutrition Status: Nutrition Problem: Moderate Malnutrition Etiology: chronic illness (CKD stage IV, hepatitis C) Signs/Symptoms: mild muscle depletion, percent weight loss (8.6% weight loss in 3 months) Percent weight loss: 8.6 % Interventions: Tube feeding, Glucerna shake  . Skin Assessment: I have examined the patient's skin and I agree with the wound assessment as performed by the wound care RN as outlined below:    Consultants:  None  Procedures:  None  Antimicrobials:  Anti-infectives (From admission, onward)    None         Subjective: Patient seen and examined.  She complains of abdominal pain .   No nausea.  No other complaint.  Objective: Vitals:   04/19/23 0326 04/19/23 0404 04/19/23 0615 04/19/23 0724  BP:  Marland Kitchen)  141/66 (!) 152/73 (!) 157/78  Pulse:  95 95 96  Resp:  16 16 18   Temp:  98.2 F (36.8 C) 98.4 F (36.9 C) 99.1 F (37.3 C)  TempSrc:  Oral Oral Oral  SpO2:  100% 99% 98%  Weight: 69.7 kg     Height:        Intake/Output  Summary (Last 24 hours) at 04/19/2023 1107 Last data filed at 04/19/2023 8119 Gross per 24 hour  Intake 1597.08 ml  Output 251 ml  Net 1346.08 ml   Filed Weights   04/17/23 0500 04/18/23 0316 04/19/23 0326  Weight: 63.3 kg 63.7 kg 69.7 kg    Examination:  General exam: Appears calm and comfortable  Respiratory system: Clear to auscultation. Respiratory effort normal. Cardiovascular system: S1 & S2 heard, RRR. No JVD, murmurs, rubs, gallops or clicks. No pedal edema. Gastrointestinal system: Abdomen is mildly distended but soft and has generalized tenderness, no organomegaly or masses felt. Normal bowel sounds heard. Central nervous system: Alert and oriented. No focal neurological deficits. Extremities: Symmetric 5 x 5 power. Skin: No rashes, lesions or ulcers  Data Reviewed: I have personally reviewed following labs and imaging studies  CBC: Recent Labs  Lab 04/13/23 1016 04/13/23 1038 04/14/23 0108 04/15/23 0023 04/16/23 0037 04/18/23 0030  WBC 11.1*  --  15.0* 11.3* 12.6* 14.3*  NEUTROABS 7.8*  --   --   --   --   --   HGB 11.3* 11.6* 11.2* 10.0* 11.5* 11.4*  HCT 33.3* 34.0* 31.7* 29.7* 34.5* 35.1*  MCV 80.6  --  78.1* 81.6 83.1 83.6  PLT 246  --  269 222 262 288   Basic Metabolic Panel: Recent Labs  Lab 04/13/23 1420 04/13/23 1607 04/14/23 0108 04/15/23 0023 04/16/23 0037 04/17/23 0234 04/18/23 0030 04/19/23 0136  NA 136   < > 134* 140 140 142 137 133*  K 3.7   < > 3.1* 3.3* 4.3 3.3* 3.8 4.3  CL 105   < > 103 114* 109 108 102 97*  CO2 18*   < > 20* 19* 20* 21* 23 24  GLUCOSE 184*   < > 247* 183* 271* 89 204* 285*  BUN 50*   < > 45* 44* 49* 46* 46* 54*  CREATININE 4.97*   < > 4.63* 4.58* 4.74* 4.95* 4.87* 4.92*  CALCIUM 8.4*   < > 8.7* 8.6* 8.7* 8.9 8.1* 7.4*  MG 1.9  --  2.0 2.1  --   --   --   --   PHOS 1.9*  --  3.0  --   --   --   --   --    < > = values in this interval not displayed.   GFR: Estimated Creatinine Clearance: 9.2 mL/min (A) (by C-G  formula based on SCr of 4.92 mg/dL (H)). Liver Function Tests: Recent Labs  Lab 04/15/23 0023 04/18/23 0030  AST 29 24  ALT 15 21  ALKPHOS 71 77  BILITOT 0.4 0.6  PROT 5.1* 6.3*  ALBUMIN 2.1* 2.3*   No results for input(s): "LIPASE", "AMYLASE" in the last 168 hours. Recent Labs  Lab 04/13/23 1016 04/15/23 0948  AMMONIA 71* 21   Coagulation Profile: No results for input(s): "INR", "PROTIME" in the last 168 hours. Cardiac Enzymes: No results for input(s): "CKTOTAL", "CKMB", "CKMBINDEX", "TROPONINI" in the last 168 hours. BNP (last 3 results) No results for input(s): "PROBNP" in the last 8760 hours. HbA1C: No results for input(s): "HGBA1C" in the last  72 hours. CBG: Recent Labs  Lab 04/18/23 1545 04/18/23 1623 04/18/23 2000 04/19/23 0406 04/19/23 0722  GLUCAP 216* 256* 133* 285* 215*   Lipid Profile: No results for input(s): "CHOL", "HDL", "LDLCALC", "TRIG", "CHOLHDL", "LDLDIRECT" in the last 72 hours. Thyroid Function Tests: No results for input(s): "TSH", "T4TOTAL", "FREET4", "T3FREE", "THYROIDAB" in the last 72 hours. Anemia Panel: No results for input(s): "VITAMINB12", "FOLATE", "FERRITIN", "TIBC", "IRON", "RETICCTPCT" in the last 72 hours. Sepsis Labs: Recent Labs  Lab 04/13/23 1936 04/14/23 0108 04/15/23 0948 04/15/23 1232  LATICACIDVEN 2.5* 2.3* 2.1* 2.1*    Recent Results (from the past 240 hour(s))  MRSA Next Gen by PCR, Nasal     Status: None   Collection Time: 04/13/23  2:11 PM   Specimen: Nasal Mucosa; Nasal Swab  Result Value Ref Range Status   MRSA by PCR Next Gen NOT DETECTED NOT DETECTED Final    Comment: (NOTE) The GeneXpert MRSA Assay (FDA approved for NASAL specimens only), is one component of a comprehensive MRSA colonization surveillance program. It is not intended to diagnose MRSA infection nor to guide or monitor treatment for MRSA infections. Test performance is not FDA approved in patients less than 41 years old. Performed at  [facility], [address], [place], [place] [address]      Radiology Studies: DG Abd 1 View  Result Date: 04/19/2023 CLINICAL DATA:  Abdominal distention EXAM: ABDOMEN - 1 VIEW COMPARISON:  Previous studies including the examination done on 04/14/2023 FINDINGS: There is mild dilation of few small bowel loops in left mid abdomen measuring up to 3.1 cm in diameter. Gas and stool are present in the colon. Small to moderate amount of stool is seen in proximal colon. There are no signs of fecal impaction in rectosigmoid. Tip of NG tube is seen in the region of distal antrum of the stomach or proximal duodenum in right upper quadrant. IMPRESSION: Nonspecific bowel gas pattern. Mild dilation of few small bowel loops in left mid abdomen may suggest ileus. Tip of NG tube is seen in right upper quadrant of abdomen possibly in distal antrum of the stomach or proximal duodenum. Electronically Signed   By: Ernie Avena M.D.   On: 04/19/2023 10:34   MR BRAIN WO CONTRAST  Result Date: 04/18/2023 CLINICAL DATA:  Altered mental status EXAM: MRI HEAD WITHOUT CONTRAST TECHNIQUE: Multiplanar, multiecho pulse sequences of the brain and surrounding structures were obtained without intravenous contrast. COMPARISON:  None Available. FINDINGS: Brain: No acute infarct, mass effect or extra-axial collection. No acute or chronic hemorrhage. There is multifocal hyperintense T2-weighted signal within the white matter. Parenchymal volume and CSF spaces are normal. The midline structures are normal. Vascular: Major flow voids are preserved. Skull and upper cervical spine: Normal calvarium and skull base. Visualized upper cervical spine and soft tissues are normal. Sinuses/Orbits:No paranasal sinus fluid levels or advanced mucosal thickening. No mastoid or middle ear effusion. Normal orbits. IMPRESSION: 1. No acute intracranial abnormality. 2. Chronic microangiopathic white matter changes. Electronically Signed   By:  Deatra Robinson M.D.   On: 04/18/2023 01:46   DG Pelvis Portable  Result Date: 04/17/2023 CLINICAL DATA:  Evaluate for retained metal, initial encounter EXAM: PORTABLE PELVIS 1 VIEWS COMPARISON:  None Available. FINDINGS: Pelvic ring is intact. No acute fracture is noted. No metallic foreign body is identified. IMPRESSION: No evidence of metallic foreign body. Electronically Signed   By: Alcide Clever M.D.   On: 04/17/2023 21:26    Scheduled Meds:  amLODipine  10 mg Oral Daily   heparin injection (subcutaneous)  5,000 Units Subcutaneous Q8H   hydrALAZINE  100 mg Oral Q8H   insulin aspart  0-20 Units Subcutaneous Q4H   insulin glargine-yfgn  15 Units Subcutaneous BID   mouth rinse  15 mL Mouth Rinse 4 times per day   Continuous Infusions:  lactated ringers Stopped (04/16/23 1611)     LOS: 6 days   Hughie Closs, MD [facility]  04/19/2023, 11:07 AM   *Please note that this is a verbal dictation therefore any spelling or grammatical errors are due to the "Dragon Medical One" system interpretation.  Please page via Amion and do not message via secure chat for urgent patient care matters. Secure chat can be used for non urgent patient care matters.  How to contact the [facility] Attending or Consulting provider 7A - 7P or covering provider during after hours 7P -7A, for this patient?  Check the care team in [facility] and look for a) attending/consulting [facility] provider listed and b) the [facility] team listed. Page or secure chat 7A-7P. Log into [email or web address] and use [place]'s universal password to access. If you do not have the password, please contact the hospital operator. Locate the [facility] provider you are looking for under [facility] and page to a number that you can be directly reached. If you still have difficulty reaching the provider, please page the [facility] (Director on Call) for the Hospitalists listed on amion for assistance.

## 2023-04-19 NOTE — Progress Notes (Signed)
At approximately 0500, patient complaining of diffuse abdominal pain - describes as "irritating".  It started approximately 1 hour earlier.  No c/o nausea.  She states she is passing some gas and that she had a small bowel movement the previous afternoon.  Bowel sounds are very faint and difficult to hear.  Abdomen is slightly distended but soft.  Patient states this is her normal.  Patient currently has Cortrack with Glucerna infusing at 75 ml per hour.  TF stopped.  PCCM notified and on-call MD will be made aware.  Will continue to monitor patient.  Bernie Covey RN

## 2023-04-19 NOTE — Care Management Important Message (Signed)
Important Message  Patient Details  Name: Nancy [name] MRN: [id number] Date of Birth: [birth year]   Medicare Important Message Given:  Yes     Brunette Lavalle Stefan Church 04/19/2023, 2:56 PM

## 2023-04-19 NOTE — Plan of Care (Signed)
  Problem: Safety: Goal: Violent Restraint(s) Outcome: Progressing   Problem: Education: Goal: Ability to describe self-care measures that may prevent or decrease complications (Diabetes Survival Skills Education) will improve Outcome: Progressing Goal: Individualized Educational Video(s) Outcome: Progressing   Problem: Coping: Goal: Ability to adjust to condition or change in health will improve Outcome: Progressing   Problem: Fluid Volume: Goal: Ability to maintain a balanced intake and output will improve Outcome: Progressing   Problem: Health Behavior/Discharge Planning: Goal: Ability to identify and utilize available resources and services will improve Outcome: Progressing Goal: Ability to manage health-related needs will improve Outcome: Progressing   Problem: Metabolic: Goal: Ability to maintain appropriate glucose levels will improve Outcome: Progressing   Problem: Nutritional: Goal: Maintenance of adequate nutrition will improve Outcome: Progressing Goal: Progress toward achieving an optimal weight will improve Outcome: Progressing   Problem: Skin Integrity: Goal: Risk for impaired skin integrity will decrease Outcome: Progressing   Problem: Tissue Perfusion: Goal: Adequacy of tissue perfusion will improve Outcome: Progressing   Problem: Education: Goal: Ability to describe self-care measures that may prevent or decrease complications (Diabetes Survival Skills Education) will improve Outcome: Progressing Goal: Individualized Educational Video(s) Outcome: Progressing   Problem: Cardiac: Goal: Ability to maintain an adequate cardiac output will improve Outcome: Progressing   Problem: Health Behavior/Discharge Planning: Goal: Ability to identify and utilize available resources and services will improve Outcome: Progressing Goal: Ability to manage health-related needs will improve Outcome: Progressing   Problem: Fluid Volume: Goal: Ability to achieve a  balanced intake and output will improve Outcome: Progressing   Problem: Metabolic: Goal: Ability to maintain appropriate glucose levels will improve Outcome: Progressing   Problem: Nutritional: Goal: Maintenance of adequate nutrition will improve Outcome: Progressing Goal: Maintenance of adequate weight for body size and type will improve Outcome: Progressing   Problem: Respiratory: Goal: Will regain and/or maintain adequate ventilation Outcome: Progressing   Problem: Urinary Elimination: Goal: Ability to achieve and maintain adequate renal perfusion and functioning will improve Outcome: Progressing   Problem: Education: Goal: Knowledge of General Education information will improve Description: Including pain rating scale, medication(s)/side effects and non-pharmacologic comfort measures Outcome: Progressing   Problem: Health Behavior/Discharge Planning: Goal: Ability to manage health-related needs will improve Outcome: Progressing   Problem: Clinical Measurements: Goal: Ability to maintain clinical measurements within normal limits will improve Outcome: Progressing Goal: Will remain free from infection Outcome: Progressing Goal: Diagnostic test results will improve Outcome: Progressing Goal: Respiratory complications will improve Outcome: Progressing Goal: Cardiovascular complication will be avoided Outcome: Progressing   Problem: Activity: Goal: Risk for activity intolerance will decrease Outcome: Progressing   Problem: Nutrition: Goal: Adequate nutrition will be maintained Outcome: Progressing   Problem: Coping: Goal: Level of anxiety will decrease Outcome: Progressing   Problem: Elimination: Goal: Will not experience complications related to bowel motility Outcome: Progressing Goal: Will not experience complications related to urinary retention Outcome: Progressing   Problem: Pain Managment: Goal: General experience of comfort will improve Outcome:  Progressing   Problem: Safety: Goal: Ability to remain free from injury will improve Outcome: Progressing   Problem: Skin Integrity: Goal: Risk for impaired skin integrity will decrease Outcome: Progressing   Problem: Safety: Goal: Non-violent Restraint(s) Outcome: Progressing

## 2023-04-19 NOTE — Progress Notes (Signed)
eLink Physician-Brief Progress Note Patient Name: Nancy [name] DOB: 1956/07/05 MRN: [id number]   Date of Service  04/19/2023  HPI/Events of Note  67 year old female that initially presented with hypertensive urgency with HHS, AKI and combative requiring 4 point restraints.  Encephalopathy persisted and stroke workup was initiated without fruition.  Tube feeds were just discontinued due to abdominal pain, distention, and increased gassiness.  Persistent hyperglycemia despite 22 units of long-acting insulin and tube feed coverage with 5 units.  eICU Interventions  Continue to hold tube feeds.  Obtain KUB.  Hold on changing insulin for the time being given change in tube feeds.     Intervention Category Minor Interventions: Clinical assessment - ordering diagnostic tests  Derak Schurman 04/19/2023, 6:21 AM

## 2023-04-19 NOTE — Progress Notes (Signed)
PT Cancellation Note  Patient Details Name: Nancy [name] MRN: [id number] DOB: 09-23-1956   Cancelled Treatment:    Reason Eval/Treat Not Completed: Patient declined, no reason specified. Pt politely declines PT for the afternoon, stating she doesn't feel well and just wants to rest. Max encouragement provided with continued refusal. Will check back as schedule allows to continue with PT POC.    Marylynn Pearson 04/19/2023, 1:54 PM  Conni Slipper, PT, DPT Acute Rehabilitation Services Secure Chat Preferred Office: [phone]

## 2023-04-19 NOTE — Progress Notes (Signed)
  Echocardiogram 2D Echocardiogram has been performed.  Nancy [name] 04/19/2023, 11:06 AM

## 2023-04-20 DIAGNOSIS — R739 Hyperglycemia, unspecified: Secondary | ICD-10-CM | POA: Diagnosis not present

## 2023-04-20 LAB — CBC WITH DIFFERENTIAL/PLATELET
Abs Immature Granulocytes: 0.05 10*3/uL (ref 0.00–0.07)
Basophils Absolute: 0.1 10*3/uL (ref 0.0–0.1)
Basophils Relative: 1 %
Eosinophils Absolute: 0.1 10*3/uL (ref 0.0–0.5)
Eosinophils Relative: 1 %
HCT: 34.4 % — ABNORMAL LOW (ref 36.0–46.0)
Hemoglobin: 10.8 g/dL — ABNORMAL LOW (ref 12.0–15.0)
Immature Granulocytes: 1 %
Lymphocytes Relative: 25 %
Lymphs Abs: 2.4 10*3/uL (ref 0.7–4.0)
MCH: 27.3 pg (ref 26.0–34.0)
MCHC: 31.4 g/dL (ref 30.0–36.0)
MCV: 87.1 fL (ref 80.0–100.0)
Monocytes Absolute: 1 10*3/uL (ref 0.1–1.0)
Monocytes Relative: 10 %
Neutro Abs: 6.1 10*3/uL (ref 1.7–7.7)
Neutrophils Relative %: 62 %
Platelets: 298 10*3/uL (ref 150–400)
RBC: 3.95 MIL/uL (ref 3.87–5.11)
RDW: 14.2 % (ref 11.5–15.5)
WBC: 9.7 10*3/uL (ref 4.0–10.5)
nRBC: 0 % (ref 0.0–0.2)

## 2023-04-20 LAB — COMPREHENSIVE METABOLIC PANEL
ALT: 46 U/L — ABNORMAL HIGH (ref 0–44)
AST: 83 U/L — ABNORMAL HIGH (ref 15–41)
Albumin: 2.3 g/dL — ABNORMAL LOW (ref 3.5–5.0)
Alkaline Phosphatase: 79 U/L (ref 38–126)
Anion gap: 11 (ref 5–15)
BUN: 54 mg/dL — ABNORMAL HIGH (ref 8–23)
CO2: 20 mmol/L — ABNORMAL LOW (ref 22–32)
Calcium: 7.5 mg/dL — ABNORMAL LOW (ref 8.9–10.3)
Chloride: 106 mmol/L (ref 98–111)
Creatinine, Ser: 5 mg/dL — ABNORMAL HIGH (ref 0.44–1.00)
GFR, Estimated: 9 mL/min — ABNORMAL LOW (ref >60)
Glucose, Bld: 105 mg/dL — ABNORMAL HIGH (ref 70–99)
Potassium: 4.8 mmol/L (ref 3.5–5.1)
Sodium: 137 mmol/L (ref 135–145)
Total Bilirubin: 0.6 mg/dL (ref 0.3–1.2)
Total Protein: 5.8 g/dL — ABNORMAL LOW (ref 6.5–8.1)

## 2023-04-20 LAB — GLUCOSE, CAPILLARY
Glucose-Capillary: 113 mg/dL — ABNORMAL HIGH (ref 70–99)
Glucose-Capillary: 119 mg/dL — ABNORMAL HIGH (ref 70–99)
Glucose-Capillary: 124 mg/dL — ABNORMAL HIGH (ref 70–99)
Glucose-Capillary: 65 mg/dL — ABNORMAL LOW (ref 70–99)
Glucose-Capillary: 89 mg/dL (ref 70–99)
Glucose-Capillary: 95 mg/dL (ref 70–99)

## 2023-04-20 LAB — HCV RNA DIAGNOSIS, NAA: HCV RNA, Quantitation: NOT DETECTED IU/mL

## 2023-04-20 MED ORDER — ORAL CARE MOUTH RINSE: 15.0000 mL | OROMUCOSAL | Status: DC | PRN

## 2023-04-20 MED ORDER — INSULIN GLARGINE-YFGN 100 UNIT/ML ~~LOC~~ SOLN
15.0000 [IU] | Freq: Every day | SUBCUTANEOUS | Status: DC
  Administered 2023-04-20: 15 [IU] via SUBCUTANEOUS
  Filled 2023-04-20 (×2): qty 0.15

## 2023-04-20 MED ORDER — FAMOTIDINE 20 MG PO TABS
20.0000 mg | ORAL_TABLET | Freq: Every day | ORAL | Status: DC
  Administered 2023-04-20 – 2023-04-22 (×3): 20 mg via ORAL
  Filled 2023-04-20 (×3): qty 1

## 2023-04-20 NOTE — Progress Notes (Addendum)
PROGRESS NOTE    Lynnox Profit  ZOX:[id number] DOB: [birth year] DOA: 04/13/2023 PCP: Georgina Quint, MD   Brief Narrative:  67 year old female with DM2, hepatitis C, endometriosis, GERD who presented with altered mental status, hyperglycemia, hyperammonemia, lactic acidosis and renal insufficiency. UDS + THC. Admitted on 5/7 and started on precedex with normal CT head.  Improved.  Transferred to floor under TRH on 04/19/2023.  Assessment & Plan:   Principal Problem:   Hyperglycemia Active Problems:   Malnutrition of moderate degree  Anomic aphasia:  -On a.m. assessment 5/11 it is observed that patient is having significant difficulty finding words with observed frustration.  She reports this is not her typical baseline. -MRI brain obtained overnight 5/12 with no acute intracranial abnormality seen -5/12 aphasia much improved.  Poorly controlled type 2 diabetes mellitus with hyperglycemia: Hemoglobin A1c 9.4 in March 2024.  On 04/19/2023, she was on 25 units of Semglee twice daily and blood sugar was elevated but due to development of ileus and keeping her n.p.o., I reduced to 15 units twice daily, she was slightly hypoglycemic so she did not receive yesterday morning dose and despite of that, her blood sugar is fairly controlled, I will further reduce to 15 units once daily instead of twice daily and continue SSI.  AKI on CKD stage IV: Baseline creatinine appears to be 2.3 to.  Creatinine on admission 5.02.  It has plateaued around this area for last few days. Per per chart review patient was evaluated at [facility] for renal transplant February of last year.  Has good urine output.  Renal ultrasound negative.  Will now consult nephrology.  Continue to avoid nephrotoxic agents.  Continue IV fluids.  Addendum 1:37 PM: Received a message from nephrologist Dr. Signe Colt that she had reviewed patient's outpatient records and she follows with Dr. Tildon Husky.  And her recent creatinine has  been between 4.5-5 and based on that, she is at her baseline and nephrology will not consult.  Moderate malnutrition: Patient was started on tube feedings through coretrak, she was reassessed by dietitian yesterday and coretrak was removed.  Ileus: On the morning of 04/19/2023, she complained of abdominal pain and abdominal x-ray showed possibility of ileus.  She was kept NPO.  She was nauseous yesterday.  She is denying any nausea but still has abdominal pain.  She is now passing flatus.  She would like to try liquids, will start on clear liquid diet.  Continue IV fluids.    Essential hypertension: Slightly elevated.  Continue home medications including amlodipine, hydralazine.  DVT prophylaxis: heparin injection 5,000 Units Start: 04/15/23 1015 SCDs Start: 04/13/23 1220   Code Status: Full Code  Family Communication: Husband present at bedside.  Plan of care discussed with patient in length and he/she verbalized understanding and agreed with it.  Status is: Inpatient Remains inpatient appropriate because: Now has ileus and still has elevated creatinine.   Estimated body mass index is 33.25 kg/m as calculated from the following:   Height as of this encounter: 4\' 9"  (1.448 m).   Weight as of this encounter: 69.7 kg.    Nutritional Assessment: Body mass index is 33.25 kg/m.Marland Kitchen Seen by dietician.  I agree with the assessment and plan as outlined below: Nutrition Status: Nutrition Problem: Moderate Malnutrition Etiology: chronic illness (CKD stage IV, hepatitis C) Signs/Symptoms: mild muscle depletion, percent weight loss (8.6% weight loss in 3 months) Percent weight loss: 8.6 % Interventions: Tube feeding, Glucerna shake  . Skin Assessment: I  have examined the patient's skin and I agree with the wound assessment as performed by the wound care RN as outlined below:    Consultants:  None  Procedures:  None  Antimicrobials:  Anti-infectives (From admission, onward)    None          Subjective: Patient seen and went.  Still complains of abdominal pain but now passing flatus and has no nausea.  Objective: Vitals:   04/19/23 1558 04/19/23 2214 04/20/23 0512 04/20/23 0825  BP: 139/73 (!) 173/101 (!) 162/90 (!) 154/84  Pulse:  92 92 94  Resp: 18 15 18 18   Temp: 97.7 F (36.5 C) 98.2 F (36.8 C) 97.9 F (36.6 C) 98.3 F (36.8 C)  TempSrc: Oral Oral Oral   SpO2: 96%  99% 98%  Weight:      Height:        Intake/Output Summary (Last 24 hours) at 04/20/2023 0957 Last data filed at 04/20/2023 0800 Gross per 24 hour  Intake 1856.82 ml  Output 1050 ml  Net 806.82 ml    Filed Weights   04/17/23 0500 04/18/23 0316 04/19/23 0326  Weight: 63.3 kg 63.7 kg 69.7 kg    Examination:  General exam: Appears calm and comfortable  Respiratory system: Clear to auscultation. Respiratory effort normal. Cardiovascular system: S1 & S2 heard, RRR. No JVD, murmurs, rubs, gallops or clicks. No pedal edema. Gastrointestinal system: Abdomen is slightly distended, soft and has mild to moderate generalized tenderness. No organomegaly or masses felt. Normal bowel sounds heard. Central nervous system: Alert and oriented. No focal neurological deficits. Extremities: Symmetric 5 x 5 power. Skin: No rashes, lesions or ulcers.   Data Reviewed: I have personally reviewed following labs and imaging studies  CBC: Recent Labs  Lab 04/13/23 1016 04/13/23 1038 04/14/23 0108 04/15/23 0023 04/16/23 0037 04/18/23 0030  WBC 11.1*  --  15.0* 11.3* 12.6* 14.3*  NEUTROABS 7.8*  --   --   --   --   --   HGB 11.3* 11.6* 11.2* 10.0* 11.5* 11.4*  HCT 33.3* 34.0* 31.7* 29.7* 34.5* 35.1*  MCV 80.6  --  78.1* 81.6 83.1 83.6  PLT 246  --  269 222 262 288    Basic Metabolic Panel: Recent Labs  Lab 04/13/23 1420 04/13/23 1607 04/14/23 0108 04/15/23 0023 04/16/23 0037 04/17/23 0234 04/18/23 0030 04/19/23 0136  NA 136   < > 134* 140 140 142 137 133*  K 3.7   < > 3.1* 3.3* 4.3  3.3* 3.8 4.3  CL 105   < > 103 114* 109 108 102 97*  CO2 18*   < > 20* 19* 20* 21* 23 24  GLUCOSE 184*   < > 247* 183* 271* 89 204* 285*  BUN 50*   < > 45* 44* 49* 46* 46* 54*  CREATININE 4.97*   < > 4.63* 4.58* 4.74* 4.95* 4.87* 4.92*  CALCIUM 8.4*   < > 8.7* 8.6* 8.7* 8.9 8.1* 7.4*  MG 1.9  --  2.0 2.1  --   --   --   --   PHOS 1.9*  --  3.0  --   --   --   --   --    < > = values in this interval not displayed.    GFR: Estimated Creatinine Clearance: 9.2 mL/min (A) (by C-G formula based on SCr of 4.92 mg/dL (H)). Liver Function Tests: Recent Labs  Lab 04/15/23 0023 04/18/23 0030  AST 29 24  ALT 15  21  ALKPHOS 71 77  BILITOT 0.4 0.6  PROT 5.1* 6.3*  ALBUMIN 2.1* 2.3*    No results for input(s): "LIPASE", "AMYLASE" in the last 168 hours. Recent Labs  Lab 04/13/23 1016 04/15/23 0948  AMMONIA 71* 21    Coagulation Profile: No results for input(s): "INR", "PROTIME" in the last 168 hours. Cardiac Enzymes: No results for input(s): "CKTOTAL", "CKMB", "CKMBINDEX", "TROPONINI" in the last 168 hours. BNP (last 3 results) No results for input(s): "PROBNP" in the last 8760 hours. HbA1C: No results for input(s): "HGBA1C" in the last 72 hours. CBG: Recent Labs  Lab 04/19/23 1524 04/19/23 2129 04/19/23 2253 04/20/23 0515 04/20/23 0718  GLUCAP 130* 68* 164* 89 113*    Lipid Profile: No results for input(s): "CHOL", "HDL", "LDLCALC", "TRIG", "CHOLHDL", "LDLDIRECT" in the last 72 hours. Thyroid Function Tests: No results for input(s): "TSH", "T4TOTAL", "FREET4", "T3FREE", "THYROIDAB" in the last 72 hours. Anemia Panel: No results for input(s): "VITAMINB12", "FOLATE", "FERRITIN", "TIBC", "IRON", "RETICCTPCT" in the last 72 hours. Sepsis Labs: Recent Labs  Lab 04/13/23 1936 04/14/23 0108 04/15/23 0948 04/15/23 1232  LATICACIDVEN 2.5* 2.3* 2.1* 2.1*     Recent Results (from the past 240 hour(s))  MRSA Next Gen by PCR, Nasal     Status: None   Collection Time:  04/13/23  2:11 PM   Specimen: Nasal Mucosa; Nasal Swab  Result Value Ref Range Status   MRSA by PCR Next Gen NOT DETECTED NOT DETECTED Final    Comment: (NOTE) The GeneXpert MRSA Assay (FDA approved for NASAL specimens only), is one component of a comprehensive MRSA colonization surveillance program. It is not intended to diagnose MRSA infection nor to guide or monitor treatment for MRSA infections. Test performance is not FDA approved in patients less than 34 years old. Performed at [facility], [address], [place], [place] [address]      Radiology Studies: ECHOCARDIOGRAM COMPLETE  Result Date: 04/19/2023    ECHOCARDIOGRAM REPORT   Patient Name:   MAYLIA KARAPETYAN Date of Exam: 04/19/2023 Medical Rec #:  [id number]       Height:       57.0 in Accession #:    [id number]      Weight:       153.7 lb Date of Birth:  Nov 03, 1956      BSA:          1.608 m Patient Age:    66 years        BP:           151/58 mmHg Patient Gender: F               HR:           95 bpm. Exam Location:  Inpatient Procedure: 2D Echo, Cardiac Doppler and Color Doppler Indications:     R06.02 SOB  History:         Patient has prior history of Echocardiogram examinations, most                  recent 03/22/2015. Signs/Symptoms:Altered Mental Status; Risk                  Factors:Hypertension and Diabetes. CKD.  Sonographer:     Sheralyn Boatman RDCS Referring Phys:  [id number] [facility] [employer] D HARRIS Diagnosing Phys: Orpah Cobb MD IMPRESSIONS  1. Left ventricular ejection fraction, by estimation, is 70 to 75%. The left ventricle has hyperdynamic function. The left ventricle has no regional wall motion abnormalities.  There is moderate concentric left ventricular hypertrophy of the septal segment. Left ventricular diastolic parameters are consistent with Grade I diastolic dysfunction (impaired relaxation).  2. Right ventricular systolic function is hyperdynamic. The right ventricular size is normal. There is normal pulmonary artery systolic  pressure.  3. The mitral valve is normal in structure. No evidence of mitral valve regurgitation.  4. The aortic valve is tricuspid. Aortic valve regurgitation is not visualized.  5. The inferior vena cava is normal in size with greater than 50% respiratory variability, suggesting right atrial pressure of 3 mmHg. Conclusion(s)/Recommendation(s): Findings consistent with hypertrophic obstructive cardiomyopathy. Consider B-blocker and calcium channel blocker like diltiazem as tolerated. FINDINGS  Left Ventricle: Left ventricular ejection fraction, by estimation, is 70 to 75%. The left ventricle has hyperdynamic function. The left ventricle has no regional wall motion abnormalities. The left ventricular internal cavity size was small. There is moderate concentric left ventricular hypertrophy of the septal segment. Left ventricular diastolic parameters are consistent with Grade I diastolic dysfunction (impaired relaxation). Right Ventricle: The right ventricular size is normal. No increase in right ventricular wall thickness. Right ventricular systolic function is hyperdynamic. There is normal pulmonary artery systolic pressure. Left Atrium: Left atrial size was normal in size. Right Atrium: Right atrial size was normal in size. Pericardium: There is no evidence of pericardial effusion. Mitral Valve: The mitral valve is normal in structure. No evidence of mitral valve regurgitation. Tricuspid Valve: The tricuspid valve is normal in structure. Tricuspid valve regurgitation is not demonstrated. Aortic Valve: Mild to moderate outflow tract obstruction with 20 to 40 mm Hg pressure gradient. The aortic valve is tricuspid. Aortic valve regurgitation is not visualized. Pulmonic Valve: The pulmonic valve was normal in structure. Pulmonic valve regurgitation is not visualized. Aorta: The aortic root is normal in size and structure. There is minimal (Grade I) atheroma plaque involving the aortic root and ascending aorta. Venous:  The inferior vena cava is normal in size with greater than 50% respiratory variability, suggesting right atrial pressure of 3 mmHg. IAS/Shunts: The atrial septum is grossly normal.  LEFT VENTRICLE PLAX 2D LVIDd:         2.90 cm     Diastology LVIDs:         1.70 cm     LV e' medial:    6.74 cm/s LV PW:         1.10 cm     LV E/e' medial:  9.9 LV IVS:        1.40 cm     LV e' lateral:   7.61 cm/s LVOT diam:     2.20 cm     LV E/e' lateral: 8.8 LV SV:         85 LV SV Index:   53 LVOT Area:     3.80 cm  LV Volumes (MOD) LV vol d, MOD A2C: 44.0 ml LV vol s, MOD A2C: 10.2 ml LV SV MOD A2C:     33.8 ml RIGHT VENTRICLE             IVC RV S prime:     10.10 cm/s  IVC diam: 0.50 cm TAPSE (M-mode): 1.6 cm LEFT ATRIUM             Index        RIGHT ATRIUM          Index LA diam:        3.20 cm 1.99 cm/m   RA Area:     6.86  cm LA Vol (A2C):   20.1 ml 12.50 ml/m  RA Volume:   9.57 ml  5.95 ml/m LA Vol (A4C):   16.1 ml 10.01 ml/m LA Biplane Vol: 19.2 ml 11.94 ml/m  AORTIC VALVE LVOT Vmax:   141.00 cm/s LVOT Vmean:  93.600 cm/s LVOT VTI:    0.223 m  AORTA Ao Root diam: 3.20 cm Ao Asc diam:  3.00 cm MITRAL VALVE MV Area (PHT): 3.91 cm    SHUNTS MV Decel Time: 194 msec    Systemic VTI:  0.22 m MV E velocity: 66.90 cm/s  Systemic Diam: 2.20 cm MV A velocity: 87.90 cm/s MV E/A ratio:  0.76 Orpah Cobb MD Electronically signed by Orpah Cobb MD Signature Date/Time: 04/19/2023/6:34:09 PM    Final    US RENAL  Result Date: 04/19/2023 CLINICAL DATA:  Acute kidney injury EXAM: RENAL / URINARY TRACT ULTRASOUND COMPLETE COMPARISON:  Renal stone protocol CT 10/06/2020 FINDINGS: Right Kidney: Renal measurements: 8.6 x 4.1 x 3.8 cm = volume: 71 mL. Echogenicity within normal limits. No mass or hydronephrosis visualized. Left Kidney: Renal measurements: 6.3 x 3.6 x 3.0 cm = volume: 36 mL. Echogenicity within normal limits. No mass or hydronephrosis visualized. Limited visualization due to adjacent shadowing bowel gas and ribs.  Bladder: Appears normal for degree of bladder distention. Other: None. IMPRESSION: Limited visualization of the left kidney due to adjacent shadowing bowel gas and ribs. No acute abnormality of the kidneys. Electronically Signed   By: Acquanetta Belling M.D.   On: 04/19/2023 14:11   DG Abd 1 View  Result Date: 04/19/2023 CLINICAL DATA:  Abdominal distention EXAM: ABDOMEN - 1 VIEW COMPARISON:  Previous studies including the examination done on 04/14/2023 FINDINGS: There is mild dilation of few small bowel loops in left mid abdomen measuring up to 3.1 cm in diameter. Gas and stool are present in the colon. Small to moderate amount of stool is seen in proximal colon. There are no signs of fecal impaction in rectosigmoid. Tip of NG tube is seen in the region of distal antrum of the stomach or proximal duodenum in right upper quadrant. IMPRESSION: Nonspecific bowel gas pattern. Mild dilation of few small bowel loops in left mid abdomen may suggest ileus. Tip of NG tube is seen in right upper quadrant of abdomen possibly in distal antrum of the stomach or proximal duodenum. Electronically Signed   By: Ernie Avena M.D.   On: 04/19/2023 10:34    Scheduled Meds:  amLODipine  10 mg Oral Daily   heparin injection (subcutaneous)  5,000 Units Subcutaneous Q8H   hydrALAZINE  100 mg Oral Q8H   insulin aspart  0-20 Units Subcutaneous Q4H   insulin glargine-yfgn  15 Units Subcutaneous BID   Continuous Infusions:  sodium chloride 125 mL/hr at 04/19/23 1137   lactated ringers Stopped (04/16/23 1611)     LOS: 7 days   Hughie Closs, MD [facility]  04/20/2023, 9:57 AM   *Please note that this is a verbal dictation therefore any spelling or grammatical errors are due to the "Dragon Medical One" system interpretation.  Please page via Amion and do not message via secure chat for urgent patient care matters. Secure chat can be used for non urgent patient care matters.  How to contact the [facility] Attending  or Consulting provider 7A - 7P or covering provider during after hours 7P -7A, for this patient?  Check the care team in [facility] and look for a) attending/consulting TRH provider listed and b) the  [place] team listed. Page or secure chat 7A-7P. Log into [email or web address] and use [place]'s universal password to access. If you do not have the password, please contact the hospital operator. Locate the [facility] provider you are looking for under [facility] and page to a number that you can be directly reached. If you still have difficulty reaching the provider, please page the [facility] (Director on Call) for the Hospitalists listed on amion for assistance.

## 2023-04-20 NOTE — TOC Initial Note (Signed)
Transition of Care [facility]) - Initial/Assessment Note    Patient Details  Name: Nancy [name] MRN: [id number] Date of Birth: [birth year]  Transition of Care [facility]) CM/SW Contact:    Tom-Johnson, Hershal Coria, RN Phone Number: 04/20/2023, 3:48 PM  Clinical Narrative:                  CM spoke with patient at bedside about needs for post hospital transition. Admitted for Hyperglycemia. Patient c/o abd pain yesterday 04/19/23 and abd xray showed a possibility of Ileus. Was placed on NPO and now on Clear Liquids. Diabetes Coordinator following. Patient's Semglee reduced from 25 units to 15 units d/t hypoglycemia.   Patient is from home alone, does not have children. Mother and siblings are supportive. Does not drive, mother transports. On disability.  PCP is Georgina Quint, MD [facility] [address].   BSC and Rollator ordered from [employer], Barbara Cower to deliver to patient at bedside.  Home health referral called in to [facility] per patient's request, Tresa Endo voiced acceptance and info on AVS.   CM will continue to follow as patient progresses with care towards discharge.          Expected Discharge Plan: Home w Home Health Services Barriers to Discharge: Barriers Resolved   Patient Goals and CMS Choice Patient states their goals for this hospitalization and ongoing recovery are:: To return home CMS Medicare.gov Compare Post Acute Care list provided to:: Patient Choice offered to / list presented to : Patient      Expected Discharge Plan and Services   Discharge Planning Services: CM Consult Post Acute Care Choice: Home Health                   DME Arranged: Bedside commode, Walker rolling with seat DME Agency: [employer] Date DME Agency Contacted: 04/20/23 Time DME Agency Contacted: 1540 Representative spoke with at DME Agency: Barbara Cower HH Arranged: PT HH Agency: [facility] Date [facility] Agency Contacted: 04/20/23 Time HH Agency Contacted:  1520 Representative spoke with at [facility] Agency: Tresa Endo  Prior Living Arrangements/Services   Lives with:: Self Patient language and need for interpreter reviewed:: Yes Do you feel safe going back to the place where you live?: Yes      Need for Family Participation in Patient Care: Yes (Comment) Care giver support system in place?: Yes (comment)   Criminal Activity/Legal Involvement Pertinent to Current Situation/Hospitalization: No - Comment as needed  Activities of Daily Living      Permission Sought/Granted Permission sought to share information with : Case Manager, Magazine features editor, Family Supports Permission granted to share information with : Yes, Verbal Permission Granted              Emotional Assessment Appearance:: Appears stated age Attitude/Demeanor/Rapport: Engaged, Gracious Affect (typically observed): Accepting, Appropriate, Calm, Hopeful Orientation: : Oriented to Self, Oriented to Place, Oriented to  Time, Oriented to Situation Alcohol / Substance Use: Not Applicable Psych Involvement: No (comment)  Admission diagnosis:  Hyperglycemia [R73.9] Patient Active Problem List   Diagnosis Date Noted   Malnutrition of moderate degree 04/15/2023   Hyperglycemia 04/13/2023   CKD (chronic kidney disease) stage 5, GFR less than 15 ml/min (HCC) 03/09/2023   Bilateral hearing loss due to cerumen impaction 01/14/2023   Stage 4 chronic kidney disease (HCC) 02/09/2022   Hyperlipidemia associated with type 2 diabetes mellitus (HCC) 04/20/2019   Vaginal atrophy 07/22/2018   Morbid obesity (HCC) 08/04/2017   CKD stage 3 due to  type 2 diabetes mellitus (HCC) 08/02/2017   GERD (gastroesophageal reflux disease) 01/16/2014   Carpal tunnel syndrome, bilateral    Hypertension associated with diabetes (HCC) 11/09/2006   Controlled diabetes mellitus with retinopathy (HCC) 12/07/2002   PCP:  Georgina Quint, MD [phone] [place],  -  [address] [place] [address] Phone: [phone] Fax: [phone]     Social Determinants of Health (SDOH) Social History: SDOH Screenings   Food Insecurity: No Food Insecurity (08/25/2022)  Housing: Low Risk  (08/25/2022)  Transportation Needs: No Transportation Needs (08/25/2022)  Alcohol Screen: Low Risk  (04/24/2022)  Depression (PHQ2-9): Medium Risk (02/16/2023)  Financial Resource Strain: Low Risk  (04/24/2022)  Social Connections: Socially Isolated (04/24/2022)  Stress: No Stress Concern Present (04/24/2022)  Tobacco Use: Low Risk  (04/13/2023)   SDOH Interventions: Transportation Interventions: Intervention Not Indicated, Inpatient TOC, Patient Resources (Friends/Family)   Readmission Risk Interventions    04/20/2023    3:42 PM  Readmission Risk Prevention Plan  Transportation Screening Complete  PCP or Specialist Appt within 5-7 Days Complete  Home Care Screening Complete  Medication Review (RN CM) Referral to Pharmacy

## 2023-04-20 NOTE — Progress Notes (Signed)
PT Cancellation Note  Patient Details Name: Nancy [name] MRN: [id number] DOB: 17-Jun-1956   Cancelled Treatment:    Reason Eval/Treat Not Completed: (P) Patient declined, no reason specified;Other (comment), pt politely declining all mobility in AM stating nausea, re-attempting in PM with pt stating nausea not resolved and asking this PTA to return tomorrow despite max encouragement.  Lenora Boys. PTA Acute Rehabilitation Services Office: [phone]     Catalina Antigua 04/20/2023, 12:12 PM

## 2023-04-20 NOTE — Inpatient Diabetes Management (Signed)
Inpatient Diabetes Program Recommendations  AACE/ADA: New Consensus Statement on Inpatient Glycemic Control (2015)  Target Ranges:  Prepandial:   less than 140 mg/dL      Peak postprandial:   less than 180 mg/dL (1-2 hours)      Critically ill patients:  140 - 180 mg/dL   Lab Results  Component Value Date   GLUCAP 113 (H) 04/20/2023   HGBA1C 9.4 (A) 02/16/2023    Review of Glycemic Control  Latest Reference Range & Units 04/19/23 07:22 04/19/23 11:25 04/19/23 12:28 04/19/23 15:24 04/19/23 21:29 04/19/23 22:53 04/20/23 05:15 04/20/23 07:18  Glucose-Capillary 70 - 99 mg/dL 161 (H) 96 97 096 (H) 68 (L) 164 (H) 89 113 (H)   Diabetes history: DM 2 Outpatient Diabetes medications: Farxiga 10 mg Daily, Trulicity 1.5 mg weekly, Lantus 25 units Daily Current orders for Inpatient glycemic control:  Semglee 15 units bid Novolog 0-20 units Q4 hours  Inpatient Diabetes Program Recommendations:    Note: Hypoglycemia yesterday after Novolog dose  -  Reduce Novolog Correction scale to 0-15 units.  Thanks  Christena Deem RN, MSN, BC-ADM Inpatient Diabetes Coordinator Team Pager [phone] (8a-5p)

## 2023-04-21 DIAGNOSIS — R739 Hyperglycemia, unspecified: Secondary | ICD-10-CM | POA: Diagnosis not present

## 2023-04-21 LAB — GLUCOSE, CAPILLARY
Glucose-Capillary: 168 mg/dL — ABNORMAL HIGH (ref 70–99)
Glucose-Capillary: 221 mg/dL — ABNORMAL HIGH (ref 70–99)
Glucose-Capillary: 258 mg/dL — ABNORMAL HIGH (ref 70–99)
Glucose-Capillary: 79 mg/dL (ref 70–99)
Glucose-Capillary: 84 mg/dL (ref 70–99)
Glucose-Capillary: 87 mg/dL (ref 70–99)

## 2023-04-21 LAB — BASIC METABOLIC PANEL
Anion gap: 12 (ref 5–15)
BUN: 50 mg/dL — ABNORMAL HIGH (ref 8–23)
CO2: 19 mmol/L — ABNORMAL LOW (ref 22–32)
Calcium: 7.4 mg/dL — ABNORMAL LOW (ref 8.9–10.3)
Chloride: 102 mmol/L (ref 98–111)
Creatinine, Ser: 4.73 mg/dL — ABNORMAL HIGH (ref 0.44–1.00)
GFR, Estimated: 10 mL/min — ABNORMAL LOW (ref >60)
Glucose, Bld: 109 mg/dL — ABNORMAL HIGH (ref 70–99)
Potassium: 4.2 mmol/L (ref 3.5–5.1)
Sodium: 133 mmol/L — ABNORMAL LOW (ref 135–145)

## 2023-04-21 MED ORDER — INSULIN ASPART 100 UNIT/ML IJ SOLN
0.0000 [IU] | Freq: Every day | INTRAMUSCULAR | Status: DC
  Administered 2023-04-21: 2 [IU] via SUBCUTANEOUS

## 2023-04-21 MED ORDER — DILTIAZEM HCL ER COATED BEADS 180 MG PO CP24
180.0000 mg | ORAL_CAPSULE | Freq: Every day | ORAL | Status: DC
  Administered 2023-04-21: 180 mg via ORAL
  Filled 2023-04-21 (×2): qty 1

## 2023-04-21 MED ORDER — INSULIN GLARGINE-YFGN 100 UNIT/ML ~~LOC~~ SOLN
10.0000 [IU] | Freq: Every day | SUBCUTANEOUS | Status: DC
  Administered 2023-04-21 – 2023-04-22 (×2): 10 [IU] via SUBCUTANEOUS
  Filled 2023-04-21 (×2): qty 0.1

## 2023-04-21 MED ORDER — INSULIN ASPART 100 UNIT/ML IJ SOLN
0.0000 [IU] | Freq: Three times a day (TID) | INTRAMUSCULAR | Status: DC
  Administered 2023-04-21: 5 [IU] via SUBCUTANEOUS
  Administered 2023-04-21: 2 [IU] via SUBCUTANEOUS
  Administered 2023-04-22: 1 [IU] via SUBCUTANEOUS
  Administered 2023-04-22: 3 [IU] via SUBCUTANEOUS
  Administered 2023-04-22: 2 [IU] via SUBCUTANEOUS

## 2023-04-21 NOTE — Progress Notes (Addendum)
PROGRESS NOTE  Nancy [name]  DOB: [birth year]  PCP: Georgina Quint, MD ZOX:[id number]  DOA: 04/13/2023  LOS: 8 days  Hospital Day: 9  Brief narrative: Nancy [name] is a 67 y.o. female with PMH significant for DM2, HLD, hepatitis C, endometriosis, GERD 5/7, patient was brought to the ED from home by EMS for altered mental status, elevated blood sugar level.  Patient was combative with EMS, given IV Haldol, Versed and was on 4 point restraints on arrival. Workup showed elevated ammonia level, blood glucose level, lactic acid level and creatinine UDS positive for THC CT head normal Started on Precedex drip and admitted to ICU.Marland Kitchen  Mental status improved with Precedex drip and has gradually weaned off.  After clinical improvement, transferred out to [facility] on 5/13  Subjective: Patient was seen and examined this morning.  Pleasant elderly African-American female.  Lying on bed.  Not in distress.  No new symptoms. Chart reviewed In the last 24 hours, no fever, blood pressure elevated to 140s Last set of labs from this morning with sodium 133, BUN/creatinine 50/4.73  Assessment and plan: Acute metabolic encephalopathy Initially brought in for altered mental status, required physical and chemical restraints.  Was admitted to ICU with Precedex drip.  Mental status gradually improved. Probably related to elevated ammonia level.  Ammonia level was noted to be elevated to 71 on admission.  UDS was also positive for THC see. Ultrasound liver without evidence of liver cirrhosis.  Was not on valproic acid at home. Recent Labs  Lab 04/15/23 0948  AMMONIA 21   Anomic aphasia 5/11, patient was noted to have significant word finding difficulty.  MRI brain 5/12 did not show any acute intracranial abnormality.  Aphasia improved gradually.  Type 2 diabetes mellitus Hypoglycemic episodes A1c 9.4 on 02/16/2023 PTA on Trulicity 1.5 mg weekly and Farxiga 10 mg daily Currently on Semglee 15 units  daily and NovoLog every 4 hours. Patient seems to be having recurrent hypoglycemic episodes. I will reduce a.m. dose of Semglee to 10 units and reduce SSI to sensitive scale ACHS Recent Labs  Lab 04/20/23 2345 04/21/23 0027 04/21/23 0348 04/21/23 0730 04/21/23 1124  GLUCAP 65* 79 84 87 168*   CKD stage IV Follows up with nephrology Dr. Malen Gauze as an outpatient.  Baseline creatinine around 5.   Creatinine running close to baseline.  AKI ruled out Previous hospitalist discussed with nephrology on-call Dr. Signe Colt 5/14. Stop IV hydration today Recent Labs    04/13/23 1420 04/13/23 1607 04/14/23 0108 04/15/23 0023 04/16/23 0037 04/17/23 0234 04/18/23 0030 04/19/23 0136 04/20/23 0838 04/21/23 0250  BUN 50* 46* 45* 44* 49* 46* 46* 54* 54* 50*  CREATININE 4.97* 4.67* 4.63* 4.58* 4.74* 4.95* 4.87* 4.92* 5.00* 4.73*    Abdominal ileus Moderate malnutrition 5/13, complaint of abdominal pain.  X-ray showed mild dilatation of bowel loops suggestive of ileus. Patient was made n.p.o.  Abdomen pain improving.  Able to tolerate clear liquid diet.  Advance to full liquid diet today. Initially required core track tube feeding which was eventually removed  Essential hypertension Chronic diastolic CHF HOCM PTA on Cardizem 240 mg daily, Farxiga 10 mg daily, Lasix 40 mg 3 times a week, Home meds on hold.  Currently on amlodipine 10 mg daily and hydralazine 100 mg 3 times daily Blood pressure running in 150s. Echocardiogram 5/13 with EF 70 to 75%, moderate concentric LVH, grade 1 diastolic dysfunction.  Findings consistent with hypertrophic obstructive cardiomyopathy. Cardiology consulted  Addendum 3:58 pm: Message received from  cardiologist Dr. Cristal Deer who reviewed images. There is no true obstruction in the LVOT and no secondary evidence of obstruction either. Looks more like hypertrophic nonobstructive pattern. This is something that should be addressed as an outpatient, nothing to manage  on the inpatient side.  Patient was already on Cardizem at home which has been on hold now.  Patient is currently on amlodipine and hydralazine.  I will stop both amlodipine and hydralazine and resume Cardizem.  HLD Lipitor  Mobility: Seen by PT.  Home with PT recommended  Goals of care   Code Status: Full Code     DVT prophylaxis:  heparin injection 5,000 Units Start: 04/15/23 1015 SCDs Start: 04/13/23 1220   Antimicrobials: None currently Fluid: none Consultants: Cardiology Family Communication: None at bedside  Status: Patient Level of care:  Telemetry Medical   Patient from: Home Anticipated d/c to: Pending clinical course Needs to continue in-hospital care:  Pending cardiology evaluation       Diet:  Diet Order             DIET SOFT Room service appropriate? Yes; Fluid consistency: Thin  Diet effective now                   Scheduled Meds:  amLODipine  10 mg Oral Daily   famotidine  20 mg Oral Daily   heparin injection (subcutaneous)  5,000 Units Subcutaneous Q8H   hydrALAZINE  100 mg Oral Q8H   insulin aspart  0-5 Units Subcutaneous QHS   insulin aspart  0-9 Units Subcutaneous TID WC   insulin glargine-yfgn  10 Units Subcutaneous Daily    PRN meds: acetaminophen, cyclobenzaprine, dextrose, hydrALAZINE, labetalol, ondansetron (ZOFRAN) IV, mouth rinse   Infusions:   lactated ringers Stopped (04/16/23 1611)    Antimicrobials: Anti-infectives (From admission, onward)    None       Nutritional status:  Body mass index is 33.97 kg/m.  Nutrition Problem: Moderate Malnutrition Etiology: chronic illness (CKD stage IV, hepatitis C) Signs/Symptoms: mild muscle depletion, percent weight loss (8.6% weight loss in 3 months) Percent weight loss: 8.6 %     Objective: Vitals:   04/21/23 0525 04/21/23 0942  BP: (!) 147/75 (!) 151/71  Pulse: 86 92  Resp: 16 17  Temp: 98.1 F (36.7 C) 98.3 F (36.8 C)  SpO2: 100% 98%    Intake/Output  Summary (Last 24 hours) at 04/21/2023 1537 Last data filed at 04/21/2023 1300 Gross per 24 hour  Intake 1680 ml  Output 1300 ml  Net 380 ml   Filed Weights   04/18/23 0316 04/19/23 0326 04/21/23 0348  Weight: 63.7 kg 69.7 kg 71.2 kg   Weight change:  Body mass index is 33.97 kg/m.   Physical Exam: General exam: Pleasant, elderly African-American female.  Not in distress Skin: No rashes, lesions or ulcers. HEENT: Atraumatic, normocephalic, no obvious bleeding Lungs: Clear to auscultation bilaterally  CVS: Regular rate and rhythm, no murmur GI/Abd soft, nontender, nondistended, bowel sound present CNS: Alert, awake, oriented x 3 Psychiatry: Mood appropriate Extremities: No pedal edema, no calf tenderness  Data Review: I have personally reviewed the laboratory data and studies available.  F/u labs ordered Unresulted Labs (From admission, onward)     Start     Ordered   04/20/23 0747  CBC with Differential/Platelet  Once,   R       Question:  Specimen collection method  Answer:  Lab=Lab collect   04/20/23 0747  Total time spent in review of labs and imaging, patient evaluation, formulation of plan, documentation and communication with family: 55 minutes  Signed, Lorin Glass, MD [facility] 04/21/2023

## 2023-04-21 NOTE — Progress Notes (Signed)
Physical Therapy Treatment Patient Details Name: Nancy [name] MRN: [id number] DOB: 07/01/1956 Today's Date: 04/21/2023   History of Present Illness 67 yo female brought to ER with altered mental status, agitation and glucose > 600, hypercalcemia, elevated ammonia, lactic acidosis and renal insufficiency.  UDS positive for THC.  She was started on precedex.    PT Comments    Pt greeted resting in bed and agreeable to session with good progress towards acute goals. Pt demonstrating bed mobility, functional transfers and gait with rollator support at grossly min guard level. Pt continues to be limited by general fatigue and weakness. Encouraged and educated pt on importance of frequent mobilization with pt verbalizing understanding. Pt continues to benefit from skilled PT services to progress toward functional mobility goals.    Recommendations for follow up therapy are one component of a multi-disciplinary discharge planning process, led by the attending physician.  Recommendations may be updated based on patient status, additional functional criteria and insurance authorization.  Follow Up Recommendations       Assistance Recommended at Discharge Intermittent Supervision/Assistance  Patient can return home with the following A little help with walking and/or transfers;A little help with bathing/dressing/bathroom;Assistance with cooking/housework;Assist for transportation;Help with stairs or ramp for entrance   Equipment Recommendations  BSC/3in1;Rollator (4 wheels)    Recommendations for Other Services       Precautions / Restrictions Precautions Precautions: Fall Restrictions Weight Bearing Restrictions: No     Mobility  Bed Mobility Overal bed mobility: Needs Assistance Bed Mobility: Supine to Sit     Supine to sit: Supervision     General bed mobility comments: + use of bedrail and increased time    Transfers Overall transfer level: Needs assistance Equipment  used: Rollator (4 wheels) Transfers: Sit to/from Stand Sit to Stand: Min guard           General transfer comment: min guard for safety, cues for hand placement    Ambulation/Gait Ambulation/Gait assistance: Min guard, Supervision Gait Distance (Feet): 370 Feet Assistive device: Rollator (4 wheels) Gait Pattern/deviations: Step-through pattern, Decreased stride length Gait velocity: decr     General Gait Details: Overall, steady gait wtih rollator x1 standing rest break due to fatigue   Stairs             Wheelchair Mobility    Modified Rankin (Stroke Patients Only)       Balance Overall balance assessment: Needs assistance Sitting-balance support: No upper extremity supported, Feet supported Sitting balance-Leahy Scale: Fair     Standing balance support: Bilateral upper extremity supported, During functional activity Standing balance-Leahy Scale: Fair Standing balance comment: relies on UE support                            Cognition Arousal/Alertness: Awake/alert Behavior During Therapy: WFL for tasks assessed/performed Overall Cognitive Status: Within Functional Limits for tasks assessed                                          Exercises      General Comments General comments (skin integrity, edema, etc.): VSS on RA      Pertinent Vitals/Pain Pain Assessment Pain Assessment: No/denies pain    Home Living  Prior Function            PT Goals (current goals can now be found in the care plan section) Acute Rehab PT Goals Patient Stated Goal: to go home PT Goal Formulation: With patient Time For Goal Achievement: 04/30/23 Progress towards PT goals: Progressing toward goals    Frequency    Min 3X/week      PT Plan      Co-evaluation              AM-PAC PT "6 Clicks" Mobility   Outcome Measure  Help needed turning from your back to your side while in a flat  bed without using bedrails?: None Help needed moving from lying on your back to sitting on the side of a flat bed without using bedrails?: A Little Help needed moving to and from a bed to a chair (including a wheelchair)?: A Little Help needed standing up from a chair using your arms (e.g., wheelchair or bedside chair)?: A Little Help needed to walk in hospital room?: A Little Help needed climbing 3-5 steps with a railing? : A Little 6 Click Score: 19    End of Session   Activity Tolerance: Patient tolerated treatment well Patient left: with call bell/phone within reach;in bed (seated EOB) Nurse Communication: Mobility status PT Visit Diagnosis: Muscle weakness (generalized) (M62.81)     Time: 7829-5621 PT Time Calculation (min) (ACUTE ONLY): 16 min  Charges:  $Gait Training: 8-22 mins                     Lenora Boys. PTA Acute Rehabilitation Services Office: [phone]    Catalina Antigua 04/21/2023, 11:01 AM

## 2023-04-21 NOTE — Progress Notes (Signed)
Mobility Specialist Progress Note   04/21/23 1525  Mobility  Activity Ambulated with assistance in hallway  Level of Assistance Modified independent, requires aide device or extra time  Assistive Device Four wheel walker (Rollator)  Distance Ambulated (ft) 304 ft  Activity Response Tolerated well  Mobility Referral Yes  $Mobility charge 1 Mobility  Mobility Specialist Start Time (ACUTE ONLY) 1450  Mobility Specialist Stop Time (ACUTE ONLY) 1520  Mobility Specialist Time Calculation (min) (ACUTE ONLY) 30 min   Received pt at EOB having no complaints and agreeable to mobility. Pt was asymptomatic throughout ambulation and returned to room w/o fault. Left back at EOB w/ call bell in reach and all needs met.  Frederico Hamman Mobility Specialist Please contact via SecureChat or  Rehab office at [phone]

## 2023-04-22 DIAGNOSIS — R739 Hyperglycemia, unspecified: Secondary | ICD-10-CM | POA: Diagnosis not present

## 2023-04-22 LAB — GLUCOSE, CAPILLARY
Glucose-Capillary: 134 mg/dL — ABNORMAL HIGH (ref 70–99)
Glucose-Capillary: 224 mg/dL — ABNORMAL HIGH (ref 70–99)
Glucose-Capillary: 170 mg/dL — ABNORMAL HIGH (ref 70–99)

## 2023-04-22 LAB — BASIC METABOLIC PANEL
Anion gap: 10 (ref 5–15)
BUN: 48 mg/dL — ABNORMAL HIGH (ref 8–23)
CO2: 18 mmol/L — ABNORMAL LOW (ref 22–32)
Calcium: 7.5 mg/dL — ABNORMAL LOW (ref 8.9–10.3)
Chloride: 103 mmol/L (ref 98–111)
Creatinine, Ser: 5.18 mg/dL — ABNORMAL HIGH (ref 0.44–1.00)
GFR, Estimated: 9 mL/min — ABNORMAL LOW (ref >60)
Glucose, Bld: 193 mg/dL — ABNORMAL HIGH (ref 70–99)
Potassium: 4.4 mmol/L (ref 3.5–5.1)
Sodium: 131 mmol/L — ABNORMAL LOW (ref 135–145)

## 2023-04-22 MED ORDER — DILTIAZEM HCL ER COATED BEADS 120 MG PO CP24
240.0000 mg | ORAL_CAPSULE | Freq: Every day | ORAL | Status: DC
  Administered 2023-04-22: 240 mg via ORAL
  Filled 2023-04-22: qty 2

## 2023-04-22 MED ORDER — DAPAGLIFLOZIN PROPANEDIOL 10 MG PO TABS
10.0000 mg | ORAL_TABLET | Freq: Every day | ORAL | Status: DC
  Administered 2023-04-22: 10 mg via ORAL
  Filled 2023-04-22: qty 1

## 2023-04-22 NOTE — Progress Notes (Cosign Needed)
    Durable Medical Equipment  (From admission, onward)           Start     Ordered   04/22/23 1335  For home use only DME 4 wheeled rolling walker with seat  Once       Question:  Patient needs a walker to treat with the following condition  Answer:  Gait instability   04/22/23 1335   04/22/23 1334  For home use only DME Bedside commode  Once       Comments: Pt's generalized weakness and decreased activity tolerance necessitate recommendation for bedside commode as she is not abe to ambulate to the bathroom.  Question:  Patient needs a bedside commode to treat with the following condition  Answer:  Weakness   04/22/23 1335

## 2023-04-22 NOTE — Discharge Summary (Signed)
Physician Discharge Summary  Nancy [name] ZOX:[id number] DOB: [birth year] DOA: 04/13/2023  PCP: Georgina Quint, MD  Admit date: 04/13/2023 Discharge date: 04/22/2023  Admitted From: Home Discharge disposition: Home with home  Recommendations at discharge:  Resume Cardizem, Farxiga and Lasix 3 times a week Follow-up with cardiology as an outpatient Follow-up with nephrology as an outpatient   Brief narrative: Nancy [name] is a 67 y.o. female with PMH significant for DM2, HLD, hepatitis C, endometriosis, GERD 5/7, patient was brought to the ED from home by EMS for altered mental status, elevated blood sugar level.  Patient was combative with EMS, given IV Haldol, Versed and was on 4 point restraints on arrival. Workup showed elevated ammonia level, blood glucose level, lactic acid level and creatinine UDS positive for THC CT head normal Started on Precedex drip and admitted to ICU.Marland Kitchen  Mental status improved with Precedex drip and has gradually weaned off.  After clinical improvement, transferred out to [facility] on 5/13  Subjective: Patient was seen and examined this morning.   Sitting up in bed.  Not in distress no new symptoms.  Significant other at bedside.  Hospital course: Acute metabolic encephalopathy Initially brought in for altered mental status, required physical and chemical restraints.  Was admitted to ICU with Precedex drip.  Mental status gradually improved. Probably related to elevated ammonia level.  Ammonia level was noted to be elevated to 71 on admission.  UDS was also positive for THC see. Ultrasound liver without evidence of liver cirrhosis.  Was not on valproic acid at home.  Ammonia level was likely elevated because of chronic constipation. Mental status gradually improved. Continue bowel regimen at home.  Anomic aphasia 5/11, patient was noted to have significant word finding difficulty.  MRI brain 5/12 did not show any acute intracranial abnormality.   Aphasia improved gradually.  Type 2 diabetes mellitus Hypoglycemic episodes A1c 9.4 on 02/16/2023 PTA on Trulicity 1.5 mg weekly and Farxiga 10 mg daily Resume the same   CKD stage IV Follows up with nephrology Dr. Malen Gauze as an outpatient.  Baseline creatinine around 5.   Creatinine running close to baseline.  AKI ruled out Previous hospitalist discussed with nephrology on-call Dr. Signe Colt 5/14.  No inpatient intervention needed.   Abdominal ileus Moderate malnutrition 5/13, complaint of abdominal pain.  X-ray showed mild dilatation of bowel loops suggestive of ileus. Patient was made n.p.o.  Abdomen pain gradually improved.  Started and advanced diet.  Soft diet to continue for next 2 to 3 days and gradually advance to regular diet  Essential hypertension Chronic diastolic CHF HOCM PTA on Cardizem 240 mg daily, Farxiga 10 mg daily, Lasix 40 mg 3 times a week, Echocardiogram 5/13 with EF 70 to 75%, moderate concentric LVH, grade 1 diastolic dysfunction.  Findings suspicious for hypertrophic obstructive cardiomyopathy. 5/15, and I discussed with cardiologist Dr. Cristal Deer who reviewed images. There is no true obstruction in the LVOT and no secondary evidence of obstruction either. Looks more like hypertrophic nonobstructive pattern.  No inpatient intervention required.  To follow-up as an outpatient.   Post discharge, continue Cardizem and [place] as before.  Also resume Lasix 3 times a week.  HLD Lipitor  Generalized weakness Impaired mobility Seen by PT.  Home with PT recommended  Goals of care   Code Status: Full Code   Wounds:  -    Discharge Exam:   Vitals:   04/21/23 1637 04/21/23 2048 04/22/23 0508 04/22/23 0821  BP: (!) 143/71 (!) 150/71 (!) 165/74 Marland Kitchen)  143/83  Pulse: 93 85 81 85  Resp: 17 18 18 18   Temp: 98 F (36.7 C) 98.4 F (36.9 C) 98.5 F (36.9 C) 98.2 F (36.8 C)  TempSrc:   Oral Oral  SpO2: 100%  100% 100%  Weight:      Height:        Body mass  index is 33.97 kg/m.   General exam: Pleasant, elderly African-American female.  Not in distress Skin: No rashes, lesions or ulcers. HEENT: Atraumatic, normocephalic, no obvious bleeding Lungs: Clear to auscultation bilaterally  CVS: Regular rate and rhythm, no murmur GI/Abd soft, nontender, nondistended, bowel sound present CNS: Alert, awake, oriented x 3 Psychiatry: Mood appropriate Extremities: No pedal edema, no calf tenderness  Follow ups:    Follow-up Information     [facility] Follow [facility].   Specialty: Home Health Services Why: Someone will call you to schedule first home visit. If you have not received a call after two days of discharging home, call their number listed. If no one comes to assess, call Case Manager at [phone]. Contact information: [address] [place] [address] [phone]         Georgina Quint, MD Follow up.   Specialty: Internal Medicine Contact information: [address] [place] [address] [phone]                 Discharge Instructions:   Discharge Instructions     Call MD for:  difficulty breathing, headache or visual disturbances   Complete by: As directed    Call MD for:  extreme fatigue   Complete by: As directed    Call MD for:  hives   Complete by: As directed    Call MD for:  persistant dizziness or light-headedness   Complete by: As directed    Call MD for:  persistant nausea and vomiting   Complete by: As directed    Call MD for:  severe uncontrolled pain   Complete by: As directed    Call MD for:  temperature >100.4   Complete by: As directed    Diet - low sodium heart healthy   Complete by: As directed    Diet Carb Modified   Complete by: As directed    Discharge instructions   Complete by: As directed    Recommendations at discharge:   Resume Cardizem, Farxiga and Lasix as before  Follow-up with cardiology as an outpatient  Follow-up with nephrology as  an outpatient  General discharge instructions: Follow with Primary MD Georgina Quint, MD in 7 days  Please request your PCP  to go over your hospital tests, procedures, radiology results at the follow up. Please get your medicines reviewed and adjusted.  Your PCP may decide to repeat certain labs or tests as needed. Do not drive, operate heavy machinery, perform activities at heights, swimming or participation in water activities or provide baby sitting services if your were admitted for syncope or siezures until you have seen by Primary MD or a Neurologist and advised to do so again. [place] Controlled Substance Reporting System database was reviewed. Do not drive, operate heavy machinery, perform activities at heights, swim, participate in water activities or provide baby-sitting services while on medications for pain, sleep and mood until your outpatient physician has reevaluated you and advised to do so again.  You are strongly recommended to comply with the dose, frequency and duration of prescribed medications. Activity: As tolerated with Full  fall precautions use walker/cane & assistance as needed Avoid using any recreational substances like cigarette, tobacco, alcohol, or non-prescribed drug. If you experience worsening of your admission symptoms, develop shortness of breath, life threatening emergency, suicidal or homicidal thoughts you must seek medical attention immediately by calling 911 or calling your MD immediately  if symptoms less severe. You must read complete instructions/literature along with all the possible adverse reactions/side effects for all the medicines you take and that have been prescribed to you. Take any new medicine only after you have completely understood and accepted all the possible adverse reactions/side effects.  Wear Seat belts while driving. You were cared for by a hospitalist during your hospital stay. If you have any questions about your discharge  medications or the care you received while you were in the hospital after you are discharged, you can call the unit and ask to speak with the hospitalist or the covering physician. Once you are discharged, your primary care physician will handle any further medical issues. Please note that NO REFILLS for any discharge medications will be authorized once you are discharged, as it is imperative that you return to your primary care physician (or establish a relationship with a primary care physician if you do not have one).   Increase activity slowly   Complete by: As directed        Discharge Medications:   Allergies as of 04/22/2023       Reactions   Ace Inhibitors Other (See Comments), Cough   Persistent dry cough   Amitriptyline Hcl Nausea And Vomiting, Other (See Comments)   GI Intolerance/Upset stomach   Aspirin Other (See Comments)   GI Intolerance   Latex Itching   Prednisone Nausea And Vomiting        Medication List     TAKE these medications    Accu-Chek FastClix Lancets Misc Use to check blood sugar up to 3 times a day   Accu-Chek Guide test strip Generic drug: glucose blood CHECK BLOOD SUGAR 3 TIMES A DAY   Accu-Chek Guide w/Device Kit 1 each by Does not apply route 3 (three) times daily. Check blood sugar 3 times a day   atorvastatin 40 MG tablet Commonly known as: LIPITOR TAKE 1 TABLET BY MOUTH EVERY DAY   cyclobenzaprine 10 MG tablet Commonly known as: FLEXERIL Take 1 tablet (10 mg total) by mouth at bedtime.   dapagliflozin propanediol 10 MG Tabs tablet Commonly known as: Farxiga TAKE 1 TABLET BY MOUTH EVERY DAY BEFORE BREAKFAST What changed:  how much to take how to take this when to take this additional instructions   diclofenac Sodium 1 % Gel Commonly known as: Voltaren Apply 2 g topically 4 (four) times daily.   Dilt-XR 240 MG 24 hr capsule Generic drug: diltiazem Take 1 capsule (240 mg total) by mouth daily.   furosemide 40 MG  tablet Commonly known as: LASIX Take 40 mg by mouth 3 (three) times a week.   insulin glargine 100 UNIT/ML injection Commonly known as: Lantus INJECT 25 UNITS INTO THE SKIN EVERY MORNING WHEN YOU WAKE UP What changed:  how much to take how to take this when to take this additional instructions   Insulin Syringe-Needle U-100 31G X 15/64" 0.5 ML Misc Use to inject insulin one time a day   Insulin Syringes (Disposable) U-100 1 ML Misc 2 Syringes by Does not apply route daily. Use to inject insulin into the skin 1 time daily. diag code E11.9. Insulin dependent  LORazepam 1 MG tablet Commonly known as: ATIVAN Sig 1 mg 1 hour before procedure as needed.   omeprazole 40 MG capsule Commonly known as: PRILOSEC TAKE 1 CAPSULE BY MOUTH EVERY DAY What changed: how much to take   senna 8.6 MG tablet Commonly known as: Senokot Take 1 tablet (8.6 mg total) by mouth as needed for constipation.   Trulicity 1.5 MG/0.5ML Sopn Generic drug: Dulaglutide Inject 1.5 mg into the skin once a week.   Vitamin D3 25 MCG (1000 UT) Caps Take 1 capsule by mouth daily.         The results of significant diagnostics from this hospitalization (including imaging, microbiology, ancillary and laboratory) are listed below for reference.    Procedures and Diagnostic Studies:   DG Abd Portable 1V  Result Date: 04/14/2023 CLINICAL DATA:  Feeding tube placement EXAM: PORTABLE ABDOMEN - 1 VIEW COMPARISON:  04/13/2023 FINDINGS: An enteric tube has been placed. Tip projects over the right upper quadrant consistent with location in the distal stomach or possibly proximal duodenum. Visualized bowel gas pattern is normal. Lung bases are clear. IMPRESSION: Feeding tube tip projects over the right upper quadrant consistent with location in the distal stomach. Electronically Signed   By: Burman Nieves M.D.   On: 04/14/2023 12:46   CT Head Wo Contrast  Result Date: 04/13/2023 CLINICAL DATA:  Altered mental  status EXAM: CT HEAD WITHOUT CONTRAST TECHNIQUE: Contiguous axial images were obtained from the base of the skull through the vertex without intravenous contrast. RADIATION DOSE REDUCTION: This exam was performed according to the departmental dose-optimization program which includes automated exposure control, adjustment of the mA and/or kV according to patient size and/or use of iterative reconstruction technique. COMPARISON:  None Available. FINDINGS: Brain: There is no mass, hemorrhage or extra-axial collection. The size and configuration of the ventricles and extra-axial CSF spaces are normal. The brain parenchyma is normal, without acute or chronic infarction. Vascular: No abnormal hyperdensity of the major intracranial arteries or dural venous sinuses. No intracranial atherosclerosis. Skull: The visualized skull base, calvarium and extracranial soft tissues are normal. Sinuses/Orbits: No fluid levels or advanced mucosal thickening of the visualized paranasal sinuses. No mastoid or middle ear effusion. The orbits are normal. IMPRESSION: Normal head CT. Electronically Signed   By: Deatra Robinson M.D.   On: 04/13/2023 21:10   DG Abd Portable 1V  Result Date: 04/13/2023 CLINICAL DATA:  NG tube placement EXAM: PORTABLE ABDOMEN - 1 VIEW COMPARISON:  Same day abdominal radiograph obtained at 3:44 p.m. FINDINGS: NG tube tip and side port are in the stomach. Visualized bowel gas pattern is unremarkable. No acute osseous abnormality. IMPRESSION: NG tube tip and side port are in the stomach. Electronically Signed   By: Allegra Lai M.D.   On: 04/13/2023 18:46   DG Abd Portable 1V  Result Date: 04/13/2023 CLINICAL DATA:  Nasogastric tube placement. EXAM: PORTABLE ABDOMEN - 1 VIEW COMPARISON:  None Available. FINDINGS: Tip and side port of the enteric tube below the diaphragm in the stomach. Nonobstructed upper abdominal bowel gas pattern. IMPRESSION: Tip and side port of the enteric tube below the diaphragm in  the stomach. Electronically Signed   By: Narda Rutherford M.D.   On: 04/13/2023 16:27   DG Chest Port 1 View  Result Date: 04/13/2023 CLINICAL DATA:  Vomiting. EXAM: PORTABLE CHEST 1 VIEW COMPARISON:  08/21/2013 FINDINGS: No consolidation, pneumothorax or effusion. No edema. Normal cardiopericardial silhouette without edema. Overlapping cardiac leads. The extreme left inferior costophrenic angle  is clipped off the edge of the film. IMPRESSION: No acute cardiopulmonary disease Electronically Signed   By: Karen Kays M.D.   On: 04/13/2023 10:49     Labs:   Basic Metabolic Panel: Recent Labs  Lab 04/18/23 0030 04/19/23 0136 04/20/23 0838 04/21/23 0250 04/22/23 0115  NA 137 133* 137 133* 131*  K 3.8 4.3 4.8 4.2 4.4  CL 102 97* 106 102 103  CO2 23 24 20* 19* 18*  GLUCOSE 204* 285* 105* 109* 193*  BUN 46* 54* 54* 50* 48*  CREATININE 4.87* 4.92* 5.00* 4.73* 5.18*  CALCIUM 8.1* 7.4* 7.5* 7.4* 7.5*   GFR Estimated Creatinine Clearance: 8.8 mL/min (A) (by C-G formula based on SCr of 5.18 mg/dL (H)). Liver Function Tests: Recent Labs  Lab 04/18/23 0030 04/20/23 0838  AST 24 83*  ALT 21 46*  ALKPHOS 77 79  BILITOT 0.6 0.6  PROT 6.3* 5.8*  ALBUMIN 2.3* 2.3*   No results for input(s): "LIPASE", "AMYLASE" in the last 168 hours. No results for input(s): "AMMONIA" in the last 168 hours. Coagulation profile No results for input(s): "INR", "PROTIME" in the last 168 hours.  CBC: Recent Labs  Lab 04/16/23 0037 04/18/23 0030 04/20/23 1120  WBC 12.6* 14.3* 9.7  NEUTROABS  --   --  6.1  HGB 11.5* 11.4* 10.8*  HCT 34.5* 35.1* 34.4*  MCV 83.1 83.6 87.1  PLT 262 288 298   Cardiac Enzymes: No results for input(s): "CKTOTAL", "CKMB", "CKMBINDEX", "TROPONINI" in the last 168 hours. BNP: Invalid input(s): "POCBNP" CBG: Recent Labs  Lab 04/21/23 1124 04/21/23 1636 04/21/23 2047 04/22/23 0720 04/22/23 1121  GLUCAP 168* 258* 221* 134* 224*   D-Dimer No results for input(s):  "DDIMER" in the last 72 hours. Hgb A1c No results for input(s): "HGBA1C" in the last 72 hours. Lipid Profile No results for input(s): "CHOL", "HDL", "LDLCALC", "TRIG", "CHOLHDL", "LDLDIRECT" in the last 72 hours. Thyroid function studies No results for input(s): "TSH", "T4TOTAL", "T3FREE", "THYROIDAB" in the last 72 hours.  Invalid input(s): "FREET3" Anemia work up No results for input(s): "VITAMINB12", "FOLATE", "FERRITIN", "TIBC", "IRON", "RETICCTPCT" in the last 72 hours. Microbiology Recent Results (from the past 240 hour(s))  MRSA Next Gen by PCR, Nasal     Status: None   Collection Time: 04/13/23  2:11 PM   Specimen: Nasal Mucosa; Nasal Swab  Result Value Ref Range Status   MRSA by PCR Next Gen NOT DETECTED NOT DETECTED Final    Comment: (NOTE) The GeneXpert MRSA Assay (FDA approved for NASAL specimens only), is one component of a comprehensive MRSA colonization surveillance program. It is not intended to diagnose MRSA infection nor to guide or monitor treatment for MRSA infections. Test performance is not FDA approved in patients less than 64 years old. Performed at [facility], [address], [place], [place] [address]     Time coordinating discharge: 45 minutes  Signed: Cesily Cuoco  [facility] 04/22/2023, 1:20 PM

## 2023-04-22 NOTE — TOC Transition Note (Signed)
Transition of Care [facility]) - CM/SW Discharge Note   Patient Details  Name: Nancy [name] MRN: [id number] Date of Birth: [birth year]  [facility] CM/SW Contact:  Tom-Johnson, Hershal Coria, RN Phone Number: 04/22/2023, 1:40 PM   Clinical Narrative:     Patient is scheduled for discharge today.  Readmission Risk Assessment done. Outpatient f/u, hospital f/u and discharge instructions on AVS. Home health info on AVS. BSC and Rollator to be delivered to patient by [employer]. Mother, Hillary Bow to transport at discharge.  No further TOC needs noted.        Final next level of care: Home w Home Health Services Barriers to Discharge: Barriers Resolved   Patient Goals and CMS Choice CMS Medicare.gov Compare Post Acute Care list provided to:: Patient Choice offered to / list presented to : Patient  Discharge Placement                  Patient to be transferred to facility by: Mother Name of family member notified: Leona    Discharge Plan and Services Additional resources added to the After Visit Summary for     Discharge Planning Services: CM Consult Post Acute Care Choice: Home Health          DME Arranged: Bedside commode, Walker rolling with seat DME Agency: [employer] Date DME Agency Contacted: 04/20/23 Time DME Agency Contacted: 1540 Representative spoke with at DME Agency: Barbara Cower HH Arranged: PT HH Agency: [facility] Date [facility] Agency Contacted: 04/20/23 Time HH Agency Contacted: 1520 Representative spoke with at [facility] Agency: Tresa Endo  Social Determinants of Health (SDOH) Interventions SDOH Screenings   Food Insecurity: No Food Insecurity (08/25/2022)  Housing: Low Risk  (08/25/2022)  Transportation Needs: No Transportation Needs (08/25/2022)  Alcohol Screen: Low Risk  (04/24/2022)  Depression (PHQ2-9): Medium Risk (02/16/2023)  Financial Resource Strain: Low Risk  (04/24/2022)  Social Connections: Socially Isolated (04/24/2022)  Stress: No Stress  Concern Present (04/24/2022)  Tobacco Use: Low Risk  (04/13/2023)     Readmission Risk Interventions    04/20/2023    3:42 PM  Readmission Risk Prevention Plan  Transportation Screening Complete  PCP or Specialist Appt within 5-7 Days Complete  Home Care Screening Complete  Medication Review (RN CM) Referral to Pharmacy

## 2023-04-22 NOTE — Inpatient Diabetes Management (Signed)
Inpatient Diabetes Program Recommendations  AACE/ADA: New Consensus Statement on Inpatient Glycemic Control (2015)  Target Ranges:  Prepandial:   less than 140 mg/dL      Peak postprandial:   less than 180 mg/dL (1-2 hours)      Critically ill patients:  140 - 180 mg/dL   Lab Results  Component Value Date   GLUCAP 134 (H) 04/22/2023   HGBA1C 9.4 (A) 02/16/2023    Review of Glycemic Control  Latest Reference Range & Units 04/21/23 07:30 04/21/23 11:24 04/21/23 16:36 04/21/23 20:47 04/22/23 07:20  Glucose-Capillary 70 - 99 mg/dL 87 409 (H) 811 (H) 914 (H) 134 (H)   Diabetes history: DM 2 Outpatient Diabetes medications: Farxiga 10 mg Daily, Trulicity 1.5 mg weekly, Lantus 25 units Daily Current orders for Inpatient glycemic control:  Semglee 10 units Daily Novolog 0-9 units tid + hs Farxiga 10 mg Daily  Inpatient Diabetes Program Recommendations:    Note: after insulin adjustments yesterday glucose trends increased after meal intake  -   Pt may benefit from low dose Novolog 2 units tid meal coverage if eating >50% of meals  Thanks  Christena Deem RN, MSN, BC-ADM Inpatient Diabetes Coordinator Team Pager [phone] (8a-5p)

## 2023-04-23 ENCOUNTER — Other Ambulatory Visit: Payer: Self-pay | Admitting: Internal Medicine

## 2023-04-23 ENCOUNTER — Telehealth: Payer: Self-pay

## 2023-04-23 NOTE — Transitions of Care (Post Inpatient/ED Visit) (Signed)
   04/23/2023  Name: Nancy [name] MRN: [id number] DOB: 1956-02-11  Today's TOC FU Call Status: Today's TOC FU Call Status:: Unsuccessul Call (1st Attempt) Unsuccessful Call (1st Attempt) Date: 04/23/23  Attempted to reach the patient regarding the most recent Inpatient/ED visit.  Follow Up Plan: Additional outreach attempts will be made to reach the patient to complete the Transitions of Care (Post Inpatient/ED visit) call.   Jodelle Gross, RN, BSN, CCM Care Management Coordinator [place] Phone: [phone]/Fax: [phone]

## 2023-04-26 ENCOUNTER — Ambulatory Visit (INDEPENDENT_AMBULATORY_CARE_PROVIDER_SITE_OTHER): Payer: 59 | Admitting: Emergency Medicine

## 2023-04-26 ENCOUNTER — Encounter: Payer: Self-pay | Admitting: Emergency Medicine

## 2023-04-26 ENCOUNTER — Encounter: Payer: Self-pay | Admitting: *Deleted

## 2023-04-26 ENCOUNTER — Other Ambulatory Visit: Payer: Self-pay | Admitting: Emergency Medicine

## 2023-04-26 ENCOUNTER — Telehealth: Payer: Self-pay | Admitting: *Deleted

## 2023-04-26 VITALS — BP 132/80 | HR 79 | Temp 97.8°F | Ht <= 58 in | Wt 156.0 lb

## 2023-04-26 DIAGNOSIS — Z7984 Long term (current) use of oral hypoglycemic drugs: Secondary | ICD-10-CM | POA: Diagnosis not present

## 2023-04-26 DIAGNOSIS — Z794 Long term (current) use of insulin: Secondary | ICD-10-CM

## 2023-04-26 DIAGNOSIS — Z7985 Long-term (current) use of injectable non-insulin antidiabetic drugs: Secondary | ICD-10-CM

## 2023-04-26 DIAGNOSIS — E785 Hyperlipidemia, unspecified: Secondary | ICD-10-CM | POA: Diagnosis not present

## 2023-04-26 DIAGNOSIS — E1159 Type 2 diabetes mellitus with other circulatory complications: Secondary | ICD-10-CM

## 2023-04-26 DIAGNOSIS — Z09 Encounter for follow-up examination after completed treatment for conditions other than malignant neoplasm: Secondary | ICD-10-CM | POA: Diagnosis not present

## 2023-04-26 DIAGNOSIS — E1169 Type 2 diabetes mellitus with other specified complication: Secondary | ICD-10-CM

## 2023-04-26 DIAGNOSIS — I152 Hypertension secondary to endocrine disorders: Secondary | ICD-10-CM

## 2023-04-26 DIAGNOSIS — K219 Gastro-esophageal reflux disease without esophagitis: Secondary | ICD-10-CM | POA: Diagnosis not present

## 2023-04-26 DIAGNOSIS — N185 Chronic kidney disease, stage 5: Secondary | ICD-10-CM

## 2023-04-26 MED ORDER — FUROSEMIDE 40 MG PO TABS: 40.0000 mg | ORAL_TABLET | Freq: Two times a day (BID) | ORAL | 2 refills | Status: DC

## 2023-04-26 MED ORDER — FUROSEMIDE 40 MG PO TABS: ORAL_TABLET | ORAL | 2 refills | Status: DC

## 2023-04-26 NOTE — Assessment & Plan Note (Signed)
Well-controlled hypertension Continue diltiazem 240 mg daily, furosemide 40 mg twice a day Lab Results  Component Value Date   HGBA1C 9.4 (A) 02/16/2023  Continue weekly Trulicity 1.5 mg and daily Farxiga 10 mg Continue daily insulin glargine 25 units Diet and nutrition discussed.

## 2023-04-26 NOTE — Telephone Encounter (Signed)
Correct.  It is supposed to be 3 times per week

## 2023-04-26 NOTE — Assessment & Plan Note (Signed)
Continues to follow-up with nephrologist

## 2023-04-26 NOTE — Progress Notes (Signed)
Nancy [name] 67 y.o.   Chief Complaint  Patient presents with   Hospitalization Follow-up    Patient was in the [facility] on 5/7, patient states since she has been home she has not been feeling good.   Patient states she wants to try another medication, she does not want to be on Trulicity     HISTORY OF PRESENT ILLNESS: This is a 67 y.o. female here for hospital discharge follow-up Physician Discharge Summary  Nancy [name] ZOX:[id number] DOB: 1956/01/10 DOA: 04/13/2023   PCP: Nancy Quint, MD   Admit date: 04/13/2023 Discharge date: 04/22/2023   Admitted From: Home Discharge disposition: Home with home   Recommendations at discharge:  Resume Cardizem, Farxiga and Lasix 3 times a week Follow-up with cardiology as an outpatient Follow-up with nephrology as an outpatient  Doing well.  Much improved.  No complaints or medical concerns today.  Brief narrative: Nancy [name] is a 67 y.o. female with PMH significant for DM2, HLD, hepatitis C, endometriosis, GERD 5/7, patient was brought to the ED from home by EMS for altered mental status, elevated blood sugar level.  Patient was combative with EMS, given IV Haldol, Versed and was on 4 point restraints on arrival. Workup showed elevated ammonia level, blood glucose level, lactic acid level and creatinine UDS positive for THC CT head normal Started on Precedex drip and admitted to ICU.Marland Kitchen  Mental status improved with Precedex drip and has gradually weaned off.   After clinical improvement, transferred out to [facility] on 5/13  Hospital course: Acute metabolic encephalopathy Initially brought in for altered mental status, required physical and chemical restraints.  Was admitted to ICU with Precedex drip.  Mental status gradually improved. Probably related to elevated ammonia level.  Ammonia level was noted to be elevated to 71 on admission.  UDS was also positive for THC see. Ultrasound liver without evidence of liver cirrhosis.   Was not on valproic acid at home.  Ammonia level was likely elevated because of chronic constipation. Mental status gradually improved. Continue bowel regimen at home.   Anomic aphasia 5/11, patient was noted to have significant word finding difficulty.  MRI brain 5/12 did not show any acute intracranial abnormality.  Aphasia improved gradually.   Type 2 diabetes mellitus Hypoglycemic episodes A1c 9.4 on 02/16/2023 PTA on Trulicity 1.5 mg weekly and Farxiga 10 mg daily Resume the same    CKD stage IV Follows up with nephrology Dr. Malen [name] as an outpatient.  Baseline creatinine around 5.   Creatinine running close to baseline.  AKI ruled out Previous hospitalist discussed with nephrology on-call Dr. Signe [name] 5/14.  No inpatient intervention needed.   Abdominal ileus Moderate malnutrition 5/13, complaint of abdominal pain.  X-ray showed mild dilatation of bowel loops suggestive of ileus. Patient was made n.p.o.  Abdomen pain gradually improved.  Started and advanced diet.  Soft diet to continue for next 2 to 3 days and gradually advance to regular diet   Essential hypertension Chronic diastolic CHF HOCM PTA on Cardizem 240 mg daily, Farxiga 10 mg daily, Lasix 40 mg 3 times a week, Echocardiogram 5/13 with EF 70 to 75%, moderate concentric LVH, grade 1 diastolic dysfunction.  Findings suspicious for hypertrophic obstructive cardiomyopathy. 5/15, and I discussed with cardiologist Nancy [name] who reviewed images. There is no true obstruction in the LVOT and no secondary evidence of obstruction either. Looks more like hypertrophic nonobstructive pattern.  No inpatient intervention required.  To follow-up as an outpatient.   Post discharge, continue Cardizem  and [place] as before.  Also resume Lasix 3 times a week.   HLD Lipitor   Generalized weakness Impaired mobility Seen by PT.  Home with PT recommended  HPI   Prior to Admission medications   Medication Sig Start Date End Date  Taking? Authorizing Provider  ACCU-CHEK FASTCLIX LANCETS MISC Use to check blood sugar up to 3 times a day 10/11/18  Yes Nancy Pont, MD  ACCU-CHEK GUIDE test strip CHECK BLOOD SUGAR 3 TIMES A DAY 03/30/23  Yes Nancy [name], Nancy Kempf, MD  atorvastatin (LIPITOR) 40 MG tablet TAKE 1 TABLET BY MOUTH EVERY DAY Patient taking differently: Take 40 mg by mouth daily. 03/30/23  Yes Nancy [name], Nancy Kempf, MD  Blood Glucose Monitoring Suppl (ACCU-CHEK GUIDE) w/Device KIT 1 each by Does not apply route 3 (three) times daily. Check blood sugar 3 times a day 07/08/20  Yes Nancy Puma, MD  Cholecalciferol (VITAMIN D3) 25 MCG (1000 UT) CAPS Take 1 capsule by mouth daily.   Yes [provider]  cyclobenzaprine (FLEXERIL) 10 MG tablet TAKE 1 TABLET BY MOUTH EVERYDAY AT BEDTIME 04/23/23  Yes Nancy Levins, MD  dapagliflozin propanediol (FARXIGA) 10 MG TABS tablet TAKE 1 TABLET BY MOUTH EVERY DAY BEFORE BREAKFAST Patient taking differently: Take 10 mg by mouth daily. 02/16/23  Yes Nancy [name], Nancy Kempf, MD  diclofenac Sodium (VOLTAREN) 1 % GEL Apply 2 g topically 4 (four) times daily. 01/06/22  Yes Nancy Bene, MD  DILT-XR 240 MG 24 hr capsule Take 1 capsule (240 mg total) by mouth daily. 12/29/22  Yes Nancy [name], Nancy Kempf, MD  Dulaglutide (TRULICITY) 1.5 MG/0.5ML SOPN Inject 1.5 mg into the skin once a week. 11/17/22  Yes Nancy [name], Nancy Kempf, MD  insulin glargine (LANTUS) 100 UNIT/ML injection INJECT 25 UNITS INTO THE SKIN EVERY MORNING WHEN YOU WAKE UP Patient taking differently: Inject 25 Units into the skin daily. 02/16/23  Yes Nancy Quint, MD  Insulin Syringe-Needle U-100 31G X 15/64" 0.5 ML MISC Use to inject insulin one time a day 07/08/20  Yes Nancy Puma, MD  Insulin Syringes, Disposable, U-100 1 ML MISC 2 Syringes by Does not apply route daily. Use to inject insulin into the skin 1 time daily. diag code E11.9. Insulin dependent 07/08/20  Yes Nancy Puma, MD  LORazepam (ATIVAN) 1 MG  tablet Sig 1 mg 1 hour before procedure as needed. 04/20/22  Yes Nancy [name], Nancy Kempf, MD  omeprazole (PRILOSEC) 40 MG capsule TAKE 1 CAPSULE BY MOUTH EVERY DAY Patient taking differently: Take 40 mg by mouth daily. 03/30/23  Yes Peyson Delao, Nancy Kempf, MD  senna (SENOKOT) 8.6 MG tablet Take 1 tablet (8.6 mg total) by mouth as needed for constipation. 07/08/20  Yes Nancy Puma, MD  furosemide (LASIX) 40 MG tablet Take 1 tablet (40 mg total) by mouth 2 (two) times daily. 04/26/23   Nancy Quint, MD    Allergies  Allergen Reactions   Ace Inhibitors Other (See Comments) and Cough    Persistent dry cough   Amitriptyline Hcl Nausea And Vomiting and Other (See Comments)    GI Intolerance/Upset stomach   Aspirin Other (See Comments)    GI Intolerance   Latex Itching   Prednisone Nausea And Vomiting    Patient Active Problem List   Diagnosis Date Noted   Malnutrition of moderate degree 04/15/2023   Hyperglycemia 04/13/2023   CKD (chronic kidney disease) stage 5, GFR less than 15 ml/min (HCC) 03/09/2023   Bilateral hearing loss due to cerumen impaction 01/14/2023  Stage 4 chronic kidney disease (HCC) 02/09/2022   Hyperlipidemia associated with type 2 diabetes mellitus (HCC) 04/20/2019   Vaginal atrophy 07/22/2018   Morbid obesity (HCC) 08/04/2017   CKD stage 3 due to type 2 diabetes mellitus (HCC) 08/02/2017   GERD (gastroesophageal reflux disease) 01/16/2014   Carpal tunnel syndrome, bilateral    Hypertension associated with diabetes (HCC) 11/09/2006   Controlled diabetes mellitus with retinopathy (HCC) 12/07/2002    Past Medical History:  Diagnosis Date   Carpal tunnel syndrome, bilateral    confirmed on nerve conduction studies   Chronic hepatitis C (HCC)    considered cured 06/2016 post retroviral therapy    Diabetes mellitus type 2, uncontrolled    Diabetic peripheral neuropathy (HCC)    GERD (gastroesophageal reflux disease)    History of endometriosis    History of  Helicobacter infection 11/07   Hyperlipidemia    Liver fibrosis 02/17/2016   F2/3     Past Surgical History:  Procedure Laterality Date   ABDOMINAL HYSTERECTOMY     CARPAL TUNNEL RELEASE      Social History   Socioeconomic History   Marital status: Single    Spouse name: Not on file   Number of children: 0   Years of education: 12   Highest education level: Not on file  Occupational History   Occupation: Scientist, research (medical): PERSONAL CARE   Occupation: Disabled  Tobacco Use   Smoking status: Never   Smokeless tobacco: Never  Vaping Use   Vaping Use: Never used  Substance and Sexual Activity   Alcohol use: No    Alcohol/week: 0.0 standard drinks of alcohol   Drug use: No   Sexual activity: Yes    Birth control/protection: None    Comment: 1 committed partner  Other Topics Concern   Not on file  Social History Narrative   Current Social History 08/28/2019        Patient lives alone in a 7th floor apartment with elevator. There are not steps up to the entrance the patient uses.       Patient's method of transportation is via family member (mom or friend).      The highest level of education was high school diploma.      The patient currently disabled.      Identified important Relationships are "My mother"       Pets : None       Interests / Fun: "TV, movies"       Current Stressors: "People, my health"       Religious / Personal Beliefs: "Baptist"       L. Leward Quan, RN, BSN       Social Determinants of Health   Financial Resource Strain: Low Risk  (04/24/2022)   Overall Financial Resource Strain (CARDIA)    Difficulty of Paying Living Expenses: Not hard at all  Food Insecurity: No Food Insecurity (08/25/2022)   Hunger Vital Sign    Worried About Running Out of Food in the Last Year: Never true    Ran Out of Food in the Last Year: Never true  Transportation Needs: No Transportation Needs (08/25/2022)   PRAPARE - Administrator, Civil Service  (Medical): No    Lack of Transportation (Non-Medical): No  Physical Activity: Not on file  Stress: No Stress Concern Present (04/24/2022)   [employer] of Occupational Health - Occupational Stress Questionnaire    Feeling of Stress : Not at all  Social  Connections: Socially Isolated (04/24/2022)   Social Connection and Isolation Panel [NHANES]    Frequency of Communication with Friends and Family: Three times a week    Frequency of Social Gatherings with Friends and Family: Three times a week    Attends Religious Services: Never    Active Member of Clubs or Organizations: No    Attends Banker Meetings: Never    Marital Status: Divorced  Catering manager Violence: Not At Risk (04/24/2022)   Humiliation, Afraid, Rape, and Kick questionnaire    Fear of Current or Ex-Partner: No    Emotionally Abused: No    Physically Abused: No    Sexually Abused: No    Family History  Problem Relation Age of Onset   Cancer Mother        lung   Kidney disease Mother    Lung cancer Maternal Uncle    Breast cancer Maternal Grandmother    Liver cancer Maternal Grandfather    Heart disease Brother    Depression Brother    Diabetes Father    Dementia Father    Colon cancer Neg Hx      Review of Systems  Constitutional: Negative.  Negative for chills and fever.  HENT: Negative.  Negative for congestion and sore throat.   Respiratory: Negative.  Negative for cough and shortness of breath.   Cardiovascular: Negative.  Negative for chest pain and palpitations.  Gastrointestinal:  Negative for abdominal pain, diarrhea, nausea and vomiting.  Genitourinary: Negative.  Negative for dysuria and hematuria.  Skin: Negative.  Negative for rash.  Neurological: Negative.  Negative for dizziness and headaches.  All other systems reviewed and are negative.   Vitals:   04/26/23 1410 04/26/23 1433  BP: (!) 160/98 132/80  Pulse: 79   Temp: 97.8 F (36.6 C)   SpO2: 98%     Physical  Exam Vitals reviewed.  Constitutional:      Appearance: Normal appearance.  HENT:     Head: Normocephalic.     Mouth/Throat:     Mouth: Mucous membranes are moist.     Pharynx: Oropharynx is clear.  Eyes:     Extraocular Movements: Extraocular movements intact.     Pupils: Pupils are equal, round, and reactive to light.  Cardiovascular:     Rate and Rhythm: Normal rate and regular rhythm.     Pulses: Normal pulses.     Heart sounds: Normal heart sounds.  Pulmonary:     Effort: Pulmonary effort is normal.     Breath sounds: Normal breath sounds.  Musculoskeletal:     Cervical back: No tenderness.  Lymphadenopathy:     Cervical: No cervical adenopathy.  Skin:    General: Skin is warm and dry.     Capillary Refill: Capillary refill takes less than 2 seconds.  Neurological:     General: No focal deficit present.     Mental Status: She is alert and oriented to person, place, and time.  Psychiatric:        Mood and Affect: Mood normal.        Behavior: Behavior normal.      ASSESSMENT & PLAN: A total of 46 minutes was spent with the patient and counseling/coordination of care regarding preparing for this visit, review of most recent office visit notes, review of most recent hospital discharge summary, review of multiple chronic medical conditions under management, review of all medications, review of most recent blood work results, education on nutrition, prognosis, documentation, need for  follow-up.  Problem List Items Addressed This Visit       Cardiovascular and Mediastinum   Hypertension associated with diabetes (HCC) - Primary    Well-controlled hypertension Continue diltiazem 240 mg daily, furosemide 40 mg twice a day Lab Results  Component Value Date   HGBA1C 9.4 (A) 02/16/2023  Continue weekly Trulicity 1.5 mg and daily Farxiga 10 mg Continue daily insulin glargine 25 units Diet and nutrition discussed.       Relevant Medications   furosemide (LASIX) 40 MG  tablet     Digestive   GERD (gastroesophageal reflux disease) (Chronic)    Stable.  Continue omeprazole 40 mg daily        Endocrine   Hyperlipidemia associated with type 2 diabetes mellitus (HCC)    Continue atorvastatin 40 mg daily      Relevant Medications   furosemide (LASIX) 40 MG tablet     Genitourinary   CKD (chronic kidney disease) stage 5, GFR less than 15 ml/min (HCC)    Continues to follow-up with nephrologist      Other Visit Diagnoses     Hospital discharge follow-up          Patient Instructions  Health Maintenance, Female Adopting a healthy lifestyle and getting preventive care are important in promoting health and wellness. Ask your health care provider about: The right schedule for you to have regular tests and exams. Things you can do on your own to prevent diseases and keep yourself healthy. What should I know about diet, weight, and exercise? Eat a healthy diet  Eat a diet that includes plenty of vegetables, fruits, low-fat dairy products, and lean protein. Do not eat a lot of foods that are high in solid fats, added sugars, or sodium. Maintain a healthy weight Body mass index (BMI) is used to identify weight problems. It estimates body fat based on height and weight. Your health care provider can help determine your BMI and help you achieve or maintain a healthy weight. Get regular exercise Get regular exercise. This is one of the most important things you can do for your health. Most adults should: Exercise for at least 150 minutes each week. The exercise should increase your heart rate and make you sweat (moderate-intensity exercise). Do strengthening exercises at least twice a week. This is in addition to the moderate-intensity exercise. Spend less time sitting. Even light physical activity can be beneficial. Watch cholesterol and blood lipids Have your blood tested for lipids and cholesterol at 66 years of age, then have this test every 5  years. Have your cholesterol levels checked more often if: Your lipid or cholesterol levels are high. You are older than 66 years of age. You are at high risk for heart disease. What should I know about cancer screening? Depending on your health history and family history, you may need to have cancer screening at various ages. This may include screening for: Breast cancer. Cervical cancer. Colorectal cancer. Skin cancer. Lung cancer. What should I know about heart disease, diabetes, and high blood pressure? Blood pressure and heart disease High blood pressure causes heart disease and increases the risk of stroke. This is more likely to develop in people who have high blood pressure readings or are overweight. Have your blood pressure checked: Every 3-5 years if you are 34-81 years of age. Every year if you are 89 years old or older. Diabetes Have regular diabetes screenings. This checks your fasting blood sugar level. Have the screening done: Once every  three years after age 18 if you are at a normal weight and have a low risk for diabetes. More often and at a younger age if you are overweight or have a high risk for diabetes. What should I know about preventing infection? Hepatitis B If you have a higher risk for hepatitis B, you should be screened for this virus. Talk with your health care provider to find out if you are at risk for hepatitis B infection. Hepatitis C Testing is recommended for: Everyone born from [birth year] through 1964. Anyone with known risk factors for hepatitis C. Sexually transmitted infections (STIs) Get screened for STIs, including gonorrhea and chlamydia, if: You are sexually active and are younger than 66 years of age. You are older than 66 years of age and your health care provider tells you that you are at risk for this type of infection. Your sexual activity has changed since you were last screened, and you are at increased risk for chlamydia or gonorrhea.  Ask your health care provider if you are at risk. Ask your health care provider about whether you are at high risk for HIV. Your health care provider may recommend a prescription medicine to help prevent HIV infection. If you choose to take medicine to prevent HIV, you should first get tested for HIV. You should then be tested every 3 months for as long as you are taking the medicine. Pregnancy If you are about to stop having your period (premenopausal) and you may become pregnant, seek counseling before you get pregnant. Take 400 to 800 micrograms (mcg) of folic acid every day if you become pregnant. Ask for birth control (contraception) if you want to prevent pregnancy. Osteoporosis and menopause Osteoporosis is a disease in which the bones lose minerals and strength with aging. This can result in bone fractures. If you are 78 years old or older, or if you are at risk for osteoporosis and fractures, ask your health care provider if you should: Be screened for bone loss. Take a calcium or vitamin D supplement to lower your risk of fractures. Be given hormone replacement therapy (HRT) to treat symptoms of menopause. Follow these instructions at home: Alcohol use Do not drink alcohol if: Your health care provider tells you not to drink. You are pregnant, may be pregnant, or are planning to become pregnant. If you drink alcohol: Limit how much you have to: 0-1 drink a day. Know how much alcohol is in your drink. In the [place] one drink equals one 12 oz bottle of beer (355 mL), one 5 oz glass of wine (148 mL), or one 1 oz glass of hard liquor (44 mL). Lifestyle Do not use any products that contain nicotine or tobacco. These products include cigarettes, chewing tobacco, and vaping devices, such as e-cigarettes. If you need help quitting, ask your health care provider. Do not use street drugs. Do not share needles. Ask your health care provider for help if you need support or information about  quitting drugs. General instructions Schedule regular health, dental, and eye exams. Stay current with your vaccines. Tell your health care provider if: You often feel depressed. You have ever been abused or do not feel safe at home. Summary Adopting a healthy lifestyle and getting preventive care are important in promoting health and wellness. Follow your health care provider's instructions about healthy diet, exercising, and getting tested or screened for diseases. Follow your health care provider's instructions on monitoring your cholesterol and blood pressure. This information is not  intended to replace advice given to you by your health care provider. Make sure you discuss any questions you have with your health care provider. Document Revised: 04/14/2021 Document Reviewed: 04/14/2021 Elsevier Patient Education  2023 Elsevier Inc.      Edwina Barth, MD [facility]

## 2023-04-26 NOTE — Assessment & Plan Note (Signed)
Continue atorvastatin 40 mg daily.  

## 2023-04-26 NOTE — Patient Instructions (Signed)

## 2023-04-26 NOTE — Transitions of Care (Post Inpatient/ED Visit) (Signed)
   04/26/2023  Name: Anyeli Fietz MRN: [id number] DOB: 1956/02/11  Today's TOC FU Call Status: Today's TOC FU Call Status:: Unsuccessul Call (1st Attempt) Unsuccessful Call (1st Attempt) Date: 04/26/23  Attempted to reach the patient regarding the most recent Inpatient visit; left HIPAA compliant voice message requesting call back  Follow Up Plan: Additional outreach attempts will be made to reach the patient to complete the Transitions of Care (Post Inpatient visit) call.   Caryl Pina, RN, BSN, CCRN Alumnus RN CM Care Coordination/ Transition of Care- [facility] Care Management [phone]: direct office

## 2023-04-26 NOTE — Assessment & Plan Note (Deleted)
Advised to stay well-hydrated and avoid NSAIDs. ?

## 2023-04-26 NOTE — Assessment & Plan Note (Signed)
Stable.  Continue  omeprazole 40mg daily

## 2023-04-27 ENCOUNTER — Other Ambulatory Visit: Payer: Self-pay | Admitting: *Deleted

## 2023-04-27 ENCOUNTER — Encounter: Payer: Self-pay | Admitting: *Deleted

## 2023-04-27 ENCOUNTER — Telehealth: Payer: Self-pay | Admitting: *Deleted

## 2023-04-27 DIAGNOSIS — M6281 Muscle weakness (generalized): Secondary | ICD-10-CM | POA: Diagnosis not present

## 2023-04-27 MED ORDER — FUROSEMIDE 40 MG PO TABS: ORAL_TABLET | ORAL | 2 refills | Status: DC

## 2023-04-27 NOTE — Transitions of Care (Post Inpatient/ED Visit) (Signed)
   04/27/2023  Name: Nancy [name] MRN: [id number] DOB: [birth year]  Today's TOC FU Call Status: Today's TOC FU Call Status:: Unsuccessful Call (3rd Attempt) Unsuccessful Call (3rd Attempt) Date: 04/27/23 (Received automated outgoing voice message that patient's voice mail box is full- unable to leave voice message requesting call-back)  Attempted to reach the patient regarding the most recent Inpatient visit; Received automated outgoing voice message that patient's voice mail box is full- unable to leave voice message requesting call-back   Follow Up Plan: No further outreach attempts will be made at this time. We have been unable to contact the patient.  Caryl Pina, RN, BSN, CCRN Alumnus RN CM Care Coordination/ Transition of Care- [facility] Care Management [phone]: direct office

## 2023-05-06 ENCOUNTER — Telehealth: Payer: Self-pay | Admitting: Emergency Medicine

## 2023-05-06 NOTE — Telephone Encounter (Signed)
Nancy [name] with [employer] - Case Manager Phone:  [phone] - ext [phone]  They need to know what insulin and directions are appropriate for this patient  Also needs another home therapy order sent in because patient cancelled the first one

## 2023-05-07 NOTE — Telephone Encounter (Signed)
Called Erica at [facility] and left message for her to call office in reference to patient medication/ home health therapy order

## 2023-05-14 ENCOUNTER — Ambulatory Visit (HOSPITAL_COMMUNITY)
Admission: EM | Admit: 2023-05-14 | Discharge: 2023-05-14 | Disposition: A | Discharge status: Home / Self Care | Payer: 59 | Attending: Internal Medicine | Admitting: Internal Medicine

## 2023-05-14 ENCOUNTER — Encounter (HOSPITAL_COMMUNITY): Payer: Self-pay

## 2023-05-14 DIAGNOSIS — L03011 Cellulitis of right finger: Secondary | ICD-10-CM

## 2023-05-14 MED ORDER — CEPHALEXIN 500 MG PO CAPS: 500.0000 mg | ORAL_CAPSULE | Freq: Three times a day (TID) | ORAL | 0 refills | Status: DC

## 2023-05-14 MED ORDER — CEPHALEXIN 500 MG PO CAPS: 500.0000 mg | ORAL_CAPSULE | Freq: Two times a day (BID) | ORAL | 0 refills | Status: AC

## 2023-05-14 NOTE — ED Triage Notes (Addendum)
Patient having injury in the right middle finger. Noticed finger injury a month ago. States there is an odor to the wound at the base of the nail.

## 2023-05-14 NOTE — Discharge Instructions (Addendum)
I drained the abscess to your fingernail today.  Please purchase Epsom salt at the pharmacy and perform Epsom salt soaks with warm water multiple times throughout the day for the next few days.  Take Keflex antibiotic 3 times a day (with breakfast, lunch, and dinner) for the next 7 days to treat infection to the fingernail.  Have the fake nail removed in a nail salon and do not replace fake nail until infection is fully gone.  You may keep the bandage on the finger and change the bandage 2 times a day.  Ibuprofen/tylenol as needed for pain.  If you develop any new or worsening symptoms or do not improve in the next 2 to 3 days, please return.  If your symptoms are severe, please go to the emergency room.  Follow-up with your primary care provider for further evaluation and management of your symptoms as well as ongoing wellness visits.  I hope you feel better!

## 2023-05-14 NOTE — ED Provider Notes (Signed)
[facility]    CSN: [id number] Arrival date & time: 05/14/23  1407      History   Chief Complaint Chief Complaint  Patient presents with   Finger Injury   Nail Problem    HPI Nancy [name] is a 67 y.o. female.   Patient presents to urgent care for evaluation of redness, swelling, and pain to the proximal nail fold of the right middle finger that started approximately 3 to 4 days ago after she got her nails done.  She has acrylic nails.  Denies numbness and tingling distally to swelling/pain.  No recent trauma or injuries to the affected digit.  No fevers, chills, or redness extending proximally from the proximal nail fold.  She has not seen any drainage from the site. She has not taken any OTC medications to help with pain/swelling. No recent antibiotic/steroid use.  History of type 2 diabetes.     Past Medical History:  Diagnosis Date   Carpal tunnel syndrome, bilateral    confirmed on nerve conduction studies   Chronic hepatitis C (HCC)    considered cured 06/2016 post retroviral therapy    Diabetes mellitus type 2, uncontrolled    Diabetic peripheral neuropathy (HCC)    GERD (gastroesophageal reflux disease)    History of endometriosis    History of Helicobacter infection 11/07   Hyperlipidemia    Liver fibrosis 02/17/2016   F2/3     Patient Active Problem List   Diagnosis Date Noted   Malnutrition of moderate degree 04/15/2023   Hyperglycemia 04/13/2023   CKD (chronic kidney disease) stage 5, GFR less than 15 ml/min (HCC) 03/09/2023   Bilateral hearing loss due to cerumen impaction 01/14/2023   Stage 4 chronic kidney disease (HCC) 02/09/2022   Hyperlipidemia associated with type 2 diabetes mellitus (HCC) 04/20/2019   Vaginal atrophy 07/22/2018   Morbid obesity (HCC) 08/04/2017   CKD stage 3 due to type 2 diabetes mellitus (HCC) 08/02/2017   GERD (gastroesophageal reflux disease) 01/16/2014   Carpal tunnel syndrome, bilateral    Hypertension  associated with diabetes (HCC) 11/09/2006   Controlled diabetes mellitus with retinopathy (HCC) 12/07/2002    Past Surgical History:  Procedure Laterality Date   ABDOMINAL HYSTERECTOMY     CARPAL TUNNEL RELEASE      OB History     Gravida  0   Para  0   Term  0   Preterm  0   AB  0   Living  0      SAB  0   IAB  0   Ectopic  0   Multiple  0   Live Births  0            Home Medications    Prior to Admission medications   Medication Sig Start Date End Date Taking? Authorizing Provider  atorvastatin (LIPITOR) 40 MG tablet TAKE 1 TABLET BY MOUTH EVERY DAY Patient taking differently: Take 40 mg by mouth daily. 03/30/23  Yes [name], [name], MD  cephALEXin (KEFLEX) 500 MG capsule Take 1 capsule (500 mg total) by mouth 3 (three) times daily for 7 days. 05/14/23 05/21/23 Yes StanhopeDonavan Burnet, FNP  Cholecalciferol (VITAMIN D3) 25 MCG (1000 UT) CAPS Take 1 capsule by mouth daily.   Yes [provider]  cyclobenzaprine (FLEXERIL) 10 MG tablet TAKE 1 TABLET BY MOUTH EVERYDAY AT BEDTIME 04/23/23  Yes Corwin Levins, MD  dapagliflozin propanediol (FARXIGA) 10 MG TABS tablet TAKE 1 TABLET BY  MOUTH EVERY DAY BEFORE BREAKFAST Patient taking differently: Take 10 mg by mouth daily. 02/16/23  Yes [name], [name], MD  Dulaglutide (TRULICITY) 1.5 MG/0.5ML SOPN Inject 1.5 mg into the skin once a week. 11/17/22  Yes [name], [name], MD  furosemide (LASIX) 40 MG tablet TAKE 1 TABLET BY MOUTH THREE TIMES PER WEEK 04/27/23  Yes [name], [name], MD  insulin glargine (LANTUS) 100 UNIT/ML injection INJECT 25 UNITS INTO THE SKIN EVERY MORNING WHEN YOU WAKE UP Patient taking differently: Inject 25 Units into the skin daily. 02/16/23  Yes [name], [name], MD  LORazepam (ATIVAN) 1 MG tablet Sig 1 mg 1 hour before procedure as needed. 04/20/22  Yes [name], [name], MD  senna (SENOKOT) 8.6 MG tablet Take 1 tablet (8.6 mg total) by mouth as needed for  constipation. 07/08/20  Yes Merrilyn Puma, MD  ACCU-CHEK FASTCLIX LANCETS MISC Use to check blood sugar up to 3 times a day 10/11/18   Eulah Pont, MD  ACCU-CHEK GUIDE test strip CHECK BLOOD SUGAR 3 TIMES A DAY 03/30/23   Georgina Quint, MD  Blood Glucose Monitoring Suppl (ACCU-CHEK GUIDE) w/Device KIT 1 each by Does not apply route 3 (three) times daily. Check blood sugar 3 times a day 07/08/20   Merrilyn Puma, MD  diclofenac Sodium (VOLTAREN) 1 % GEL Apply 2 g topically 4 (four) times daily. 01/06/22   Adron Bene, MD  DILT-XR 240 MG 24 hr capsule Take 1 capsule (240 mg total) by mouth daily. 12/29/22   Georgina Quint, MD  Insulin Syringe-Needle U-100 31G X 15/64" 0.5 ML MISC Use to inject insulin one time a day 07/08/20   Merrilyn Puma, MD  Insulin Syringes, Disposable, U-100 1 ML MISC 2 Syringes by Does not apply route daily. Use to inject insulin into the skin 1 time daily. diag code E11.9. Insulin dependent 07/08/20   Merrilyn Puma, MD  omeprazole (PRILOSEC) 40 MG capsule TAKE 1 CAPSULE BY MOUTH EVERY DAY Patient taking differently: Take 40 mg by mouth daily. 03/30/23   Georgina Quint, MD    Family History Family History  Problem Relation Age of Onset   Cancer Mother        lung   Kidney disease Mother    Lung cancer Maternal Uncle    Breast cancer Maternal Grandmother    Liver cancer Maternal Grandfather    Heart disease Brother    Depression Brother    Diabetes Father    Dementia Father    Colon cancer Neg Hx     Social History Social History   Tobacco Use   Smoking status: Never   Smokeless tobacco: Never  Vaping Use   Vaping Use: Never used  Substance Use Topics   Alcohol use: No    Alcohol/week: 0.0 standard drinks of alcohol   Drug use: No     Allergies   Ace inhibitors, Amitriptyline hcl, Aspirin, Latex, and Prednisone   Review of Systems Review of Systems Per HPI  Physical Exam Triage Vital Signs ED Triage Vitals  Enc Vitals Group      BP 05/14/23 1431 (!) 152/83     Pulse Rate 05/14/23 1431 65     Resp 05/14/23 1431 18     Temp 05/14/23 1431 98.6 F (37 C)     Temp Source 05/14/23 1431 Oral     SpO2 05/14/23 1431 98 %     Weight 05/14/23 1431 156 lb 1.4 oz (70.8 kg)  Height 05/14/23 1431 4\' 9"  (1.448 m)     Head Circumference --      Peak Flow --      Pain Score 05/14/23 1428 10     Pain Loc --      Pain Edu? --      Excl. in GC? --    No data found.  Updated Vital Signs BP (!) 152/83 (BP Location: Right Arm)   Pulse 65   Temp 98.6 F (37 C) (Oral)   Resp 18   Ht 4\' 9"  (1.448 m)   Wt 156 lb 1.4 oz (70.8 kg)   LMP 02/15/2009   SpO2 98%   BMI 33.78 kg/m   Visual Acuity Right Eye Distance:   Left Eye Distance:   Bilateral Distance:    Right Eye Near:   Left Eye Near:    Bilateral Near:     Physical Exam Vitals and nursing note reviewed.  Constitutional:      Appearance: She is not ill-appearing or toxic-appearing.  HENT:     Head: Normocephalic and atraumatic.     Right Ear: Hearing and external ear normal.     Left Ear: Hearing and external ear normal.     Nose: Nose normal.     Mouth/Throat:     Lips: Pink.  Eyes:     General: Lids are normal. Vision grossly intact. Gaze aligned appropriately.     Extraocular Movements: Extraocular movements intact.     Conjunctiva/sclera: Conjunctivae normal.  Pulmonary:     Effort: Pulmonary effort is normal.  Musculoskeletal:     Cervical back: Neck supple.  Skin:    General: Skin is warm and dry.     Capillary Refill: Capillary refill takes less than 2 seconds.     Findings: Abscess present. No rash.     Comments: Paronychia to the proximal nail fold. See image below.  Neurological:     General: No focal deficit present.     Mental Status: She is alert and oriented to person, place, and time. Mental status is at baseline.     Cranial Nerves: No dysarthria or facial asymmetry.  Psychiatric:        Mood and Affect: Mood normal.         Speech: Speech normal.        Behavior: Behavior normal.        Thought Content: Thought content normal.        Judgment: Judgment normal.      UC Treatments / Results  Labs (all labs ordered are listed, but only abnormal results are displayed) Labs Reviewed - No data to display  EKG   Radiology No results found.  Procedures Incision and Drainage  Date/Time: 05/14/2023 3:46 PM  Performed by: Carlisle Beers, FNP Authorized by: Carlisle Beers, FNP   Consent:    Consent obtained:  Verbal   Consent given by:  Patient   Risks, benefits, and alternatives were discussed: yes     Risks discussed:  Bleeding, damage to other organs, infection, pain and incomplete drainage   Alternatives discussed:  No treatment Universal protocol:    Patient identity confirmed:  Verbally with patient Location:    Type:  Abscess   Size:  0.5cm   Location:  Upper extremity   Upper extremity location:  Finger   Finger location:  R long finger Pre-procedure details:    Skin preparation:  Povidone-iodine Sedation:    Sedation type:  None Anesthesia:  Anesthesia method:  Topical application (Pain-eeze) Procedure type:    Complexity:  Simple Procedure details:    Incision types:  Stab incision (18 guague needle)   Incision depth:  Dermal   Drainage:  Bloody and purulent   Drainage amount:  Scant   Wound treatment:  Wound left open   Packing materials:  None Post-procedure details:    Procedure completion:  Tolerated well, no immediate complications  (including critical care time)  Medications Ordered in UC Medications - No data to display  Initial Impression / Assessment and Plan / UC Course  I have reviewed the triage vital signs and the nursing notes.  Pertinent labs & imaging results that were available during my care of the patient were reviewed by me and considered in my medical decision making (see chart for details).   1.  Paronychia of right middle finger,  cellulitis of right middle finger She is neurovascularly intact distally to swelling/injury.  Paronychia drained, see procedure note above for further details.  Epsom salt soaks recommended frequently.  Tylenol as needed for pain.  Wound cleansed and dressed in clinic with nonstick gauze and Coban wrap.  Encouraged to change dressing at least twice daily.  Patient placed on Keflex twice daily for 7 days to treat cellulitis due to history of immunosuppression (type 2 diabetes).  Follow-up with primary care provider recommended.  Discussed physical exam and available lab work findings in clinic with patient.  Counseled patient regarding appropriate use of medications and potential side effects for all medications recommended or prescribed today. Discussed red flag signs and symptoms of worsening condition,when to call the PCP office, return to urgent care, and when to seek higher level of care in the emergency department. Patient verbalizes understanding and agreement with plan. All questions answered. Patient discharged in stable condition.    Final Clinical Impressions(s) / UC Diagnoses   Final diagnoses:  Cellulitis of right middle finger  Paronychia of right middle finger     Discharge Instructions      I drained the abscess to your fingernail today.  Please purchase Epsom salt at the pharmacy and perform Epsom salt soaks with warm water multiple times throughout the day for the next few days.  Take Keflex antibiotic 3 times a day (with breakfast, lunch, and dinner) for the next 7 days to treat infection to the fingernail.  Have the fake nail removed in a nail salon and do not replace fake nail until infection is fully gone.  You may keep the bandage on the finger and change the bandage 2 times a day.  Ibuprofen/tylenol as needed for pain.  If you develop any new or worsening symptoms or do not improve in the next 2 to 3 days, please return.  If your symptoms are severe, please go to  the emergency room.  Follow-up with your primary care provider for further evaluation and management of your symptoms as well as ongoing wellness visits.  I hope you feel better!   ED Prescriptions     Medication Sig Dispense Auth. Provider   cephALEXin (KEFLEX) 500 MG capsule Take 1 capsule (500 mg total) by mouth 3 (three) times daily for 7 days. 21 capsule Carlisle Beers, FNP      PDMP not reviewed this encounter.   Carlisle Beers, [place] 05/14/23 1549

## 2023-05-19 ENCOUNTER — Telehealth: Payer: Self-pay | Admitting: Emergency Medicine

## 2023-05-19 ENCOUNTER — Encounter: Payer: Self-pay | Admitting: Emergency Medicine

## 2023-05-19 ENCOUNTER — Telehealth: Payer: Self-pay | Admitting: *Deleted

## 2023-05-19 ENCOUNTER — Emergency Department (HOSPITAL_COMMUNITY): Payer: 59

## 2023-05-19 ENCOUNTER — Ambulatory Visit (INDEPENDENT_AMBULATORY_CARE_PROVIDER_SITE_OTHER): Payer: 59 | Admitting: Emergency Medicine

## 2023-05-19 ENCOUNTER — Other Ambulatory Visit: Payer: Self-pay

## 2023-05-19 ENCOUNTER — Ambulatory Visit (INDEPENDENT_AMBULATORY_CARE_PROVIDER_SITE_OTHER): Payer: 59

## 2023-05-19 ENCOUNTER — Emergency Department (HOSPITAL_COMMUNITY)
Admission: EM | Admit: 2023-05-19 | Discharge: 2023-05-20 | Disposition: A | Discharge status: Home / Self Care | Payer: 59 | Attending: Emergency Medicine | Admitting: Emergency Medicine

## 2023-05-19 VITALS — BP 140/88 | HR 88 | Temp 98.2°F | Ht <= 58 in | Wt 152.2 lb

## 2023-05-19 DIAGNOSIS — N185 Chronic kidney disease, stage 5: Secondary | ICD-10-CM | POA: Insufficient documentation

## 2023-05-19 DIAGNOSIS — R0602 Shortness of breath: Secondary | ICD-10-CM | POA: Diagnosis not present

## 2023-05-19 DIAGNOSIS — R0609 Other forms of dyspnea: Secondary | ICD-10-CM | POA: Insufficient documentation

## 2023-05-19 DIAGNOSIS — E1165 Type 2 diabetes mellitus with hyperglycemia: Secondary | ICD-10-CM | POA: Diagnosis not present

## 2023-05-19 DIAGNOSIS — I5030 Unspecified diastolic (congestive) heart failure: Secondary | ICD-10-CM | POA: Insufficient documentation

## 2023-05-19 DIAGNOSIS — R799 Abnormal finding of blood chemistry, unspecified: Secondary | ICD-10-CM | POA: Diagnosis present

## 2023-05-19 DIAGNOSIS — Z7984 Long term (current) use of oral hypoglycemic drugs: Secondary | ICD-10-CM | POA: Insufficient documentation

## 2023-05-19 DIAGNOSIS — E119 Type 2 diabetes mellitus without complications: Secondary | ICD-10-CM | POA: Diagnosis not present

## 2023-05-19 DIAGNOSIS — Z794 Long term (current) use of insulin: Secondary | ICD-10-CM

## 2023-05-19 DIAGNOSIS — N179 Acute kidney failure, unspecified: Secondary | ICD-10-CM | POA: Insufficient documentation

## 2023-05-19 DIAGNOSIS — R0989 Other specified symptoms and signs involving the circulatory and respiratory systems: Secondary | ICD-10-CM | POA: Diagnosis not present

## 2023-05-19 DIAGNOSIS — R079 Chest pain, unspecified: Secondary | ICD-10-CM | POA: Diagnosis not present

## 2023-05-19 DIAGNOSIS — Z91040 Latex allergy status: Secondary | ICD-10-CM | POA: Insufficient documentation

## 2023-05-19 LAB — CBC
HCT: 34.8 % — ABNORMAL LOW (ref 36.0–46.0)
Hemoglobin: 11.8 g/dL — ABNORMAL LOW (ref 12.0–15.0)
MCH: 27.6 pg (ref 26.0–34.0)
MCHC: 33.9 g/dL (ref 30.0–36.0)
MCV: 81.3 fL (ref 80.0–100.0)
Platelets: 346 10*3/uL (ref 150–400)
RBC: 4.28 MIL/uL (ref 3.87–5.11)
RDW: 13.5 % (ref 11.5–15.5)
WBC: 12.1 10*3/uL — ABNORMAL HIGH (ref 4.0–10.5)
nRBC: 0 % (ref 0.0–0.2)

## 2023-05-19 LAB — URINALYSIS, ROUTINE W REFLEX MICROSCOPIC
Bilirubin Urine: NEGATIVE
Glucose, UA: >=500 mg/dL — AB
Hgb urine dipstick: NEGATIVE
Ketones, ur: NEGATIVE mg/dL
Nitrite: NEGATIVE
Protein, ur: >=300 mg/dL — AB
Specific Gravity, Urine: 1.014 (ref 1.005–1.030)
pH: 5 (ref 5.0–8.0)

## 2023-05-19 LAB — COMPREHENSIVE METABOLIC PANEL
ALT: 7 U/L (ref 0–35)
AST: 9 U/L (ref 0–37)
Albumin: 3.8 g/dL (ref 3.5–5.2)
Alkaline Phosphatase: 91 U/L (ref 39–117)
BUN: 53 mg/dL — ABNORMAL HIGH (ref 6–23)
CO2: 24 mEq/L (ref 19–32)
Calcium: 9.1 mg/dL (ref 8.4–10.5)
Chloride: 92 mEq/L — ABNORMAL LOW (ref 96–112)
Creatinine, Ser: 6.08 mg/dL (ref 0.40–1.20)
GFR: 6.77 mL/min — CL (ref >60.00)
Glucose, Bld: 362 mg/dL — ABNORMAL HIGH (ref 70–99)
Potassium: 3.7 mEq/L (ref 3.5–5.1)
Sodium: 127 mEq/L — ABNORMAL LOW (ref 135–145)
Total Bilirubin: 0.4 mg/dL (ref 0.2–1.2)
Total Protein: 8 g/dL (ref 6.0–8.3)

## 2023-05-19 LAB — CBC WITH DIFFERENTIAL/PLATELET
Basophils Absolute: 0.1 10*3/uL (ref 0.0–0.1)
Basophils Relative: 0.7 % (ref 0.0–3.0)
Eosinophils Absolute: 0.1 10*3/uL (ref 0.0–0.7)
Eosinophils Relative: 0.6 % (ref 0.0–5.0)
HCT: 35.6 % — ABNORMAL LOW (ref 36.0–46.0)
Hemoglobin: 11.8 g/dL — ABNORMAL LOW (ref 12.0–15.0)
Lymphocytes Relative: 22.6 % (ref 12.0–46.0)
Lymphs Abs: 2.7 10*3/uL (ref 0.7–4.0)
MCHC: 33 g/dL (ref 30.0–36.0)
MCV: 81.7 fl (ref 78.0–100.0)
Monocytes Absolute: 0.6 10*3/uL (ref 0.1–1.0)
Monocytes Relative: 5.2 % (ref 3.0–12.0)
Neutro Abs: 8.4 10*3/uL — ABNORMAL HIGH (ref 1.4–7.7)
Neutrophils Relative %: 70.9 % (ref 43.0–77.0)
Platelets: 348 10*3/uL (ref 150.0–400.0)
RBC: 4.36 Mil/uL (ref 3.87–5.11)
RDW: 14.1 % (ref 11.5–15.5)
WBC: 11.8 10*3/uL — ABNORMAL HIGH (ref 4.0–10.5)

## 2023-05-19 LAB — POCT GLYCOSYLATED HEMOGLOBIN (HGB A1C): Hemoglobin A1C: 11.5 % — AB (ref 4.0–5.6)

## 2023-05-19 LAB — BASIC METABOLIC PANEL
Anion gap: 14 (ref 5–15)
BUN: 55 mg/dL — ABNORMAL HIGH (ref 8–23)
CO2: 18 mmol/L — ABNORMAL LOW (ref 22–32)
Calcium: 8.6 mg/dL — ABNORMAL LOW (ref 8.9–10.3)
Chloride: 97 mmol/L — ABNORMAL LOW (ref 98–111)
Creatinine, Ser: 6.12 mg/dL — ABNORMAL HIGH (ref 0.44–1.00)
GFR, Estimated: 7 mL/min — ABNORMAL LOW (ref >60)
Glucose, Bld: 329 mg/dL — ABNORMAL HIGH (ref 70–99)
Potassium: 3.6 mmol/L (ref 3.5–5.1)
Sodium: 129 mmol/L — ABNORMAL LOW (ref 135–145)

## 2023-05-19 LAB — CBG MONITORING, ED: Glucose-Capillary: 303 mg/dL — ABNORMAL HIGH (ref 70–99)

## 2023-05-19 NOTE — Assessment & Plan Note (Signed)
Creatinine and GFR continue to deteriorate No hyperkalemia and not volume overloaded No metabolic acidosis Borderline uremia.  Some mental changes May need emergent dialysis Recommend ER evaluation and possible admission

## 2023-05-19 NOTE — Telephone Encounter (Signed)
Got it!     Thanks =).

## 2023-05-19 NOTE — Assessment & Plan Note (Signed)
Hemoglobin A1c at 11.5 and glucose 362 At risk for DKA.  On insulin at present time. Hyponatremia and hypochloremia Not volume overloaded. Given her fragile condition, recommend ER evaluation and possible admission.

## 2023-05-19 NOTE — Telephone Encounter (Signed)
CRITICAL VALUE STICKER  CRITICAL VALUE: Creatinine 6.08 GFR 6.77   RECEIVER (on-site recipient of call): Don'Quashia Daphine Deutscher   DATE & TIME NOTIFIED:  05/19/2023 4:40pm  MESSENGER (representative from lab): SAA  MD NOTIFIED: Yes   TIME OF NOTIFICATION:4:40pm  RESPONSE:  awaiting response

## 2023-05-19 NOTE — ED Triage Notes (Signed)
Patient reports being called with results from labs drawn today, advised by her PCP to come to the ED because her kidney function is worsening and she is approaching needing dialysis and her A1C is 11. Compliant with Lantus. Patient denies complaints.

## 2023-05-19 NOTE — Assessment & Plan Note (Signed)
No overt heart failure on chest x-ray. Dyspnea on exertion of recent onset most likely related to deteriorating chronic kidney disease and uncontrolled diabetes with impending DKA Recommend ER evaluation and possible admission

## 2023-05-19 NOTE — Telephone Encounter (Signed)
Blood results discussed with patient. Recommend to go to emergency department today.

## 2023-05-19 NOTE — Patient Instructions (Signed)
Restart Trulicity 1.5 mg weekly Increase insulin glargine to 25 units daily Continue Farxiga 10 mg daily Follow-up with nephrologist ASAP  Chronic Kidney Disease, Adult Chronic kidney disease is when lasting damage happens to the kidneys slowly over a long time. The kidneys help to: Make pee (urine). Make hormones. Keep the right amount of fluids and chemicals in the body. Most often, this disease does not go away. You must take steps to help keep the kidney damage from getting worse. If steps are not taken, the kidneys might stop working forever. What are the causes? Diabetes. High blood pressure. Diseases that affect the heart and blood vessels. Other kidney diseases. Diseases of the body's disease-fighting system. A problem with the flow of pee. Infections of the organs that make pee, store it, and take it out of the body. Swelling or irritation of your blood vessels. What increases the risk? Getting older. Having someone in your family who has kidney disease or kidney failure. Having a disease caused by genes. Taking medicines often that harm the kidneys. Being near or having contact with harmful substances. Being very overweight. Using tobacco now or in the past. What are the signs or symptoms? Feeling very tired. Having a swollen face, legs, ankles, or feet. Feeling like you may vomit or vomiting. Not feeling hungry. Being confused or not able to focus. Twitches and cramps in the leg muscles or other muscles. Dry, itchy skin. A taste of metal in your mouth. Making less pee, or making more pee. Shortness of breath. Trouble sleeping. You may also become anemic or get weak bones. Anemic means there is not enough red blood cells or hemoglobin in your blood. You may get symptoms slowly. You may not notice them until the kidney damage gets very bad. How is this treated? Often, there is no cure for this disease. Treatment can help with symptoms and help keep the disease from  getting worse. You may need to: Avoid alcohol. Avoid foods that are high in salt, potassium, phosphorous, and protein. Take medicines for symptoms and to help control other conditions. Have dialysis. This treatment gets harmful waste out of your body. Treat other problems that cause your kidney disease or make it worse. Follow these instructions at home: Medicines Take over-the-counter and prescription medicines only as told by your doctor. Do not take any new medicines, vitamins, or supplements unless your doctor says it is okay. Lifestyle  Do not smoke or use any products that contain nicotine or tobacco. If you need help quitting, ask your doctor. If you drink alcohol: Limit how much you use to: 0-1 drink a day for women who are not pregnant. 0-2 drinks a day for men. Know how much alcohol is in your drink. In the [place] one drink equals one 12 oz bottle of beer (355 mL), one 5 oz glass of wine (148 mL), or one 1 oz glass of hard liquor (44 mL). Stay at a healthy weight. If you need help losing weight, ask your doctor. General instructions  Follow instructions from your doctor about what you cannot eat or drink. Track your blood pressure at home. Tell your doctor about any changes. If you have diabetes, track your blood sugar. Exercise at least 30 minutes a day, 5 days a week. Keep your shots (vaccinations) up to date. Keep all follow-up visits. Where to find more information American Association of Kidney Patients: [employer]: www.kidney.org American [employer]: [employer]ee Life Options: www.lifeoptions.org Kidney School: www.kidneyschool.org Contact a doctor  if: Your symptoms get worse. You get new symptoms. Get help right away if: You get symptoms of end-stage kidney disease. These include: Headaches. Losing feeling in your hands or feet. Easy bruising. Having hiccups often. Chest pain. Shortness of breath. Lack of menstrual periods,  in women. You have a fever. You make less pee than normal. You have pain or you bleed when you pee or poop. These symptoms may be an emergency. Get help right away. Call your local emergency services (911 in the [place] Do not wait to see if the symptoms will go away. Do not drive yourself to the hospital. Summary Chronic kidney disease is when lasting damage happens to the kidneys slowly over a long time. Causes of this disease include diabetes and high blood pressure. Often, there is no cure for this disease. Treatment can help symptoms and help keep the disease from getting worse. Treatment may involve lifestyle changes, medicines, and dialysis. This information is not intended to replace advice given to you by your health care provider. Make sure you discuss any questions you have with your health care provider. Document Revised: 02/28/2020 Document Reviewed: 02/28/2020 Elsevier Patient Education  2024 [employer].

## 2023-05-19 NOTE — Progress Notes (Signed)
Nancy [name] 67 y.o.   Chief Complaint  Patient presents with   Medical Management of Chronic Issues    f/u appt, lack of energy, patient states she has no energy to do anything, tired all the time     HISTORY OF PRESENT ILLNESS: This is a 67 y.o. female complaining of feeling tired all the time and dyspnea on exertion for the past couple weeks Denies pain or any other associated symptoms  HPI   Prior to Admission medications   Medication Sig Start Date End Date Taking? Authorizing Provider  ACCU-CHEK FASTCLIX LANCETS MISC Use to check blood sugar up to 3 times a day 10/11/18  Yes Eulah Pont, MD  ACCU-CHEK GUIDE test strip CHECK BLOOD SUGAR 3 TIMES A DAY 03/30/23  Yes Deborha Moseley, [name], MD  atorvastatin (LIPITOR) 40 MG tablet TAKE 1 TABLET BY MOUTH EVERY DAY Patient taking differently: Take 40 mg by mouth daily. 03/30/23  Yes Evin Loiseau, [name], MD  Blood Glucose Monitoring Suppl (ACCU-CHEK GUIDE) w/Device KIT 1 each by Does not apply route 3 (three) times daily. Check blood sugar 3 times a day 07/08/20  Yes Merrilyn Puma, MD  cephALEXin (KEFLEX) 500 MG capsule Take 1 capsule (500 mg total) by mouth 2 (two) times daily for 7 days. 05/14/23 05/21/23 Yes StanhopeDonavan Burnet, FNP  Cholecalciferol (VITAMIN D3) 25 MCG (1000 UT) CAPS Take 1 capsule by mouth daily.   Yes [provider]  cyclobenzaprine (FLEXERIL) 10 MG tablet TAKE 1 TABLET BY MOUTH EVERYDAY AT BEDTIME 04/23/23  Yes Corwin Levins, MD  dapagliflozin propanediol (FARXIGA) 10 MG TABS tablet TAKE 1 TABLET BY MOUTH EVERY DAY BEFORE BREAKFAST Patient taking differently: Take 10 mg by mouth daily. 02/16/23  Yes [place] Scheiderer, [name], MD  diclofenac Sodium (VOLTAREN) 1 % GEL Apply 2 g topically 4 (four) times daily. 01/06/22  Yes Adron Bene, MD  DILT-XR 240 MG 24 hr capsule Take 1 capsule (240 mg total) by mouth daily. 12/29/22  Yes Kashlyn Salinas, [name], MD  Dulaglutide (TRULICITY) 1.5 MG/0.5ML SOPN Inject 1.5  mg into the skin once a week. 11/17/22  Yes Masahiro Iglesia, [name], MD  furosemide (LASIX) 40 MG tablet TAKE 1 TABLET BY MOUTH THREE TIMES PER WEEK 04/27/23  Yes Adlean Hardeman, [name], MD  insulin glargine (LANTUS) 100 UNIT/ML injection INJECT 25 UNITS INTO THE SKIN EVERY [name] WHEN YOU WAKE UP Patient taking differently: Inject 25 Units into the skin daily. 02/16/23  Yes Georgina Quint, MD  Insulin Syringe-Needle U-100 31G X 15/64" 0.5 ML MISC Use to inject insulin one time a day 07/08/20  Yes Merrilyn Puma, MD  Insulin Syringes, Disposable, U-100 1 ML MISC 2 Syringes by Does not apply route daily. Use to inject insulin into the skin 1 time daily. diag code E11.9. Insulin dependent 07/08/20  Yes Merrilyn Puma, MD  LORazepam (ATIVAN) 1 MG tablet Sig 1 mg 1 hour before procedure as needed. 04/20/22  Yes Stephnie Parlier, [name], MD  omeprazole (PRILOSEC) 40 MG capsule TAKE 1 CAPSULE BY MOUTH EVERY DAY Patient taking differently: Take 40 mg by mouth daily. 03/30/23  Yes Tyeesha Riker, [name], MD  senna (SENOKOT) 8.6 MG tablet Take 1 tablet (8.6 mg total) by mouth as needed for constipation. 07/08/20  Yes Merrilyn Puma, MD    Allergies  Allergen Reactions   Ace Inhibitors Other (See Comments) and Cough    Persistent dry cough   Amitriptyline Hcl Nausea And Vomiting and Other (See Comments)  GI Intolerance/Upset stomach   Aspirin Other (See Comments)    GI Intolerance   Latex Itching   Prednisone Nausea And Vomiting    Patient Active Problem List   Diagnosis Date Noted   Malnutrition of moderate degree 04/15/2023   Hyperglycemia 04/13/2023   CKD (chronic kidney disease) stage 5, GFR less than 15 ml/min (HCC) 03/09/2023   Bilateral hearing loss due to cerumen impaction 01/14/2023   Stage 4 chronic kidney disease (HCC) 02/09/2022   Hyperlipidemia associated with type 2 diabetes mellitus (HCC) 04/20/2019   Vaginal atrophy 07/22/2018   Morbid obesity (HCC) 08/04/2017   CKD stage 3 due to  type 2 diabetes mellitus (HCC) 08/02/2017   GERD (gastroesophageal reflux disease) 01/16/2014   Carpal tunnel syndrome, bilateral    Hypertension associated with diabetes (HCC) 11/09/2006   Controlled diabetes mellitus with retinopathy (HCC) 12/07/2002    Past Medical History:  Diagnosis Date   Carpal tunnel syndrome, bilateral    confirmed on nerve conduction studies   Chronic hepatitis C (HCC)    considered cured 06/2016 post retroviral therapy    Diabetes mellitus type 2, uncontrolled    Diabetic peripheral neuropathy (HCC)    GERD (gastroesophageal reflux disease)    History of endometriosis    History of Helicobacter infection 11/07   Hyperlipidemia    Liver fibrosis 02/17/2016   F2/3     Past Surgical History:  Procedure Laterality Date   ABDOMINAL HYSTERECTOMY     CARPAL TUNNEL RELEASE      Social History   Socioeconomic History   Marital status: Single    Spouse name: Not on file   Number of children: 0   Years of education: 12   Highest education level: Not on file  Occupational History   Occupation: Scientist, research (medical): PERSONAL CARE   Occupation: Disabled  Tobacco Use   Smoking status: Never   Smokeless tobacco: Never  Vaping Use   Vaping Use: Never used  Substance and Sexual Activity   Alcohol use: No    Alcohol/week: 0.0 standard drinks of alcohol   Drug use: No   Sexual activity: Yes    Birth control/protection: None    Comment: 1 committed partner  Other Topics Concern   Not on file  Social History Narrative   Current Social History 08/28/2019        Patient lives alone in a 7th floor apartment with elevator. There are not steps up to the entrance the patient uses.       Patient's method of transportation is via family member (mom or friend).      The highest level of education was high school diploma.      The patient currently disabled.      Identified important Relationships are "My mother"       Pets : None       Interests / Fun: "TV,  movies"       Current Stressors: "People, my health"       Religious / Personal Beliefs: "Baptist"       L. Leward Quan, RN, BSN       Social Determinants of Health   Financial Resource Strain: Low Risk  (04/24/2022)   Overall Financial Resource Strain (CARDIA)    Difficulty of Paying Living Expenses: Not hard at all  Food Insecurity: No Food Insecurity (08/25/2022)   Hunger Vital Sign    Worried About Running Out of Food in the Last Year: Never true  Ran Out of Food in the Last Year: Never true  Transportation Needs: No Transportation Needs (08/25/2022)   PRAPARE - Administrator, Civil Service (Medical): No    Lack of Transportation (Non-Medical): No  Physical Activity: Not on file  Stress: No Stress Concern Present (04/24/2022)   [employer] of Occupational Health - Occupational Stress Questionnaire    Feeling of Stress : Not at all  Social Connections: Socially Isolated (04/24/2022)   Social Connection and Isolation Panel [NHANES]    Frequency of Communication with Friends and Family: Three times a week    Frequency of Social Gatherings with Friends and Family: Three times a week    Attends Religious Services: Never    Active Member of Clubs or Organizations: No    Attends Banker Meetings: Never    Marital Status: Divorced  Catering manager Violence: Not At Risk (04/24/2022)   Humiliation, Afraid, Rape, and Kick questionnaire    Fear of Current or Ex-Partner: No    Emotionally Abused: No    Physically Abused: No    Sexually Abused: No    Family History  Problem Relation Age of Onset   Cancer Mother        lung   Kidney disease Mother    Lung cancer Maternal Uncle    Breast cancer Maternal Grandmother    Liver cancer Maternal Grandfather    Heart disease Brother    Depression Brother    Diabetes Father    Dementia Father    Colon cancer Neg Hx      Review of Systems  Constitutional:  Positive for malaise/fatigue. Negative for fever.   HENT:  Negative for congestion and sore throat.   Respiratory:  Positive for shortness of breath (Dyspnea on exertion). Negative for cough.   Cardiovascular:  Negative for chest pain.  Gastrointestinal:  Negative for abdominal pain, diarrhea, nausea and vomiting.  Genitourinary: Negative.  Negative for dysuria.  Skin: Negative.  Negative for rash.  Neurological: Negative.  Negative for dizziness and headaches.  All other systems reviewed and are negative.   Vitals:   05/19/23 1304  BP: (!) 140/88  Pulse: 88  Temp: 98.2 F (36.8 C)  SpO2: 98%     Physical Exam Vitals reviewed.  Constitutional:      Appearance: Normal appearance.  HENT:     Head: Normocephalic.     Mouth/Throat:     Mouth: Mucous membranes are moist.     Pharynx: Oropharynx is clear.  Eyes:     Extraocular Movements: Extraocular movements intact.  Cardiovascular:     Rate and Rhythm: Normal rate and regular rhythm.     Pulses: Normal pulses.     Heart sounds: Normal heart sounds.  Pulmonary:     Effort: Pulmonary effort is normal.     Breath sounds: Normal breath sounds.  Abdominal:     Palpations: Abdomen is soft.     Tenderness: There is no abdominal tenderness.  Skin:    General: Skin is warm and dry.  Neurological:     Mental Status: She is alert and oriented to person, place, and time.     Comments: Occasional stuttering with mild confusion  Psychiatric:        Mood and Affect: Mood normal.        Behavior: Behavior normal.    Results for orders placed or performed in visit on 05/19/23 (from the past 24 hour(s))  CBC with Differential/Platelet  Status: Abnormal   Collection Time: 05/19/23  1:52 PM  Result Value Ref Range   WBC 11.8 (H) 4.0 - 10.5 K/uL   RBC 4.36 3.87 - 5.11 Mil/uL   Hemoglobin 11.8 (L) 12.0 - 15.0 g/dL   HCT 16.1 (L) 09.6 - 04.5 %   MCV 81.7 78.0 - 100.0 fl   MCHC 33.0 30.0 - 36.0 g/dL   RDW 40.9 81.1 - 91.4 %   Platelets 348.0 150.0 - 400.0 K/uL   Neutrophils  Relative % 70.9 43.0 - 77.0 %   Lymphocytes Relative 22.6 12.0 - 46.0 %   Monocytes Relative 5.2 3.0 - 12.0 %   Eosinophils Relative 0.6 0.0 - 5.0 %   Basophils Relative 0.7 0.0 - 3.0 %   Neutro Abs 8.4 (H) 1.4 - 7.7 K/uL   Lymphs Abs 2.7 0.7 - 4.0 K/uL   Monocytes Absolute 0.6 0.1 - 1.0 K/uL   Eosinophils Absolute 0.1 0.0 - 0.7 K/uL   Basophils Absolute 0.1 0.0 - 0.1 K/uL  Comprehensive metabolic panel     Status: Abnormal   Collection Time: 05/19/23  1:52 PM  Result Value Ref Range   Sodium 127 (L) 135 - 145 mEq/L   Potassium 3.7 3.5 - 5.1 mEq/L   Chloride 92 (L) 96 - 112 mEq/L   CO2 24 19 - 32 mEq/L   Glucose, Bld 362 (H) 70 - 99 mg/dL   BUN 53 (H) 6 - 23 mg/dL   Creatinine, Ser 7.82 (HH) 0.40 - 1.20 mg/dL   Total Bilirubin 0.4 0.2 - 1.2 mg/dL   Alkaline Phosphatase 91 39 - 117 U/L   AST 9 0 - 37 U/L   ALT 7 0 - 35 U/L   Total Protein 8.0 6.0 - 8.3 g/dL   Albumin 3.8 3.5 - 5.2 g/dL   GFR 9.56 (LL) >21.30 mL/min   Calcium 9.1 8.4 - 10.5 mg/dL   Narrative   Critical result called to [facility].. on 05/19/2023 4:38 PM by Arnoldo Hooker. Results were read back to caller. (CREA, GFR)  POCT HgB A1C     Status: Abnormal   Collection Time: 05/19/23  1:58 PM  Result Value Ref Range   Hemoglobin A1C 11.5 (A) 4.0 - 5.6 %   HbA1c POC (<> result, manual entry)     HbA1c, POC (prediabetic range)     HbA1c, POC (controlled diabetic range)     DG Chest 2 View  Result Date: 05/19/2023 CLINICAL DATA:  Shortness of breath with exertion. Chest pain. Diabetes. EXAM: CHEST - 2 VIEW COMPARISON:  04/13/2023 FINDINGS: The heart size and mediastinal contours are within normal limits. Both lungs are clear. The visualized skeletal structures are unremarkable. IMPRESSION: No active cardiopulmonary disease. Electronically Signed   By: Paulina Fusi M.D.   On: 05/19/2023 14:25     ASSESSMENT & PLAN: A total of 49 minutes was spent with the patient and counseling/coordination of care regarding preparing for this  visit, review of most recent office visit notes, review of multiple chronic medical conditions under management, review of all medications, review of chest x-ray done today, review of blood work results, prognosis, documentation, and need for follow-up in the ER today.   Problem List Items Addressed This Visit       Endocrine   Uncontrolled type 2 diabetes mellitus with hyperglycemia (HCC)    Hemoglobin A1c at 11.5 and glucose 362 At risk for DKA.  On insulin at present time. Hyponatremia and hypochloremia Not volume overloaded. Given  her fragile condition, recommend ER evaluation and possible admission.      Relevant Orders   POCT HgB A1C (Completed)   CBC with Differential/Platelet (Completed)   Comprehensive metabolic panel (Completed)     Genitourinary   CKD (chronic kidney disease) stage 5, GFR less than 15 ml/min (HCC)    Creatinine and GFR continue to deteriorate No hyperkalemia and not volume overloaded No metabolic acidosis Borderline uremia.  Some mental changes May need emergent dialysis Recommend ER evaluation and possible admission        Other   Dyspnea on exertion - Primary    No overt heart failure on chest x-ray. Dyspnea on exertion of recent onset most likely related to deteriorating chronic kidney disease and uncontrolled diabetes with impending DKA Recommend ER evaluation and possible admission      Relevant Orders   DG Chest 2 View (Completed)   CBC with Differential/Platelet (Completed)   Comprehensive metabolic panel (Completed)   Patient Instructions  Restart Trulicity 1.5 mg weekly Increase insulin glargine to 25 units daily Continue Farxiga 10 mg daily Follow-up with nephrologist ASAP  Chronic Kidney Disease, Adult Chronic kidney disease is when lasting damage happens to the kidneys slowly over a long time. The kidneys help to: Make pee (urine). Make hormones. Keep the right amount of fluids and chemicals in the body. Most often, this  disease does not go away. You must take steps to help keep the kidney damage from getting worse. If steps are not taken, the kidneys might stop working forever. What are the causes? Diabetes. High blood pressure. Diseases that affect the heart and blood vessels. Other kidney diseases. Diseases of the body's disease-fighting system. A problem with the flow of pee. Infections of the organs that make pee, store it, and take it out of the body. Swelling or irritation of your blood vessels. What increases the risk? Getting older. Having someone in your family who has kidney disease or kidney failure. Having a disease caused by genes. Taking medicines often that harm the kidneys. Being near or having contact with harmful substances. Being very overweight. Using tobacco now or in the past. What are the signs or symptoms? Feeling very tired. Having a swollen face, legs, ankles, or feet. Feeling like you may vomit or vomiting. Not feeling hungry. Being confused or not able to focus. Twitches and cramps in the leg muscles or other muscles. Dry, itchy skin. A taste of metal in your mouth. Making less pee, or making more pee. Shortness of breath. Trouble sleeping. You may also become anemic or get weak bones. Anemic means there is not enough red blood cells or hemoglobin in your blood. You may get symptoms slowly. You may not notice them until the kidney damage gets very bad. How is this treated? Often, there is no cure for this disease. Treatment can help with symptoms and help keep the disease from getting worse. You may need to: Avoid alcohol. Avoid foods that are high in salt, potassium, phosphorous, and protein. Take medicines for symptoms and to help control other conditions. Have dialysis. This treatment gets harmful waste out of your body. Treat other problems that cause your kidney disease or make it worse. Follow these instructions at home: Medicines Take over-the-counter and  prescription medicines only as told by your doctor. Do not take any new medicines, vitamins, or supplements unless your doctor says it is okay. Lifestyle  Do not smoke or use any products that contain nicotine or tobacco. If you need  help quitting, ask your doctor. If you drink alcohol: Limit how much you use to: 0-1 drink a day for women who are not pregnant. 0-2 drinks a day for men. Know how much alcohol is in your drink. In the [place] one drink equals one 12 oz bottle of beer (355 mL), one 5 oz glass of wine (148 mL), or one 1 oz glass of hard liquor (44 mL). Stay at a healthy weight. If you need help losing weight, ask your doctor. General instructions  Follow instructions from your doctor about what you cannot eat or drink. Track your blood pressure at home. Tell your doctor about any changes. If you have diabetes, track your blood sugar. Exercise at least 30 minutes a day, 5 days a week. Keep your shots (vaccinations) up to date. Keep all follow-up visits. Where to find more information American Association of Kidney Patients: ResidentialShow.is [employer]: www.kidney.org American Kidney Fund: FightingMatch.com.ee Life Options: www.lifeoptions.org Kidney School: www.kidneyschool.org Contact a doctor if: Your symptoms get worse. You get new symptoms. Get help right away if: You get symptoms of end-stage kidney disease. These include: Headaches. Losing feeling in your hands or feet. Easy bruising. Having hiccups often. Chest pain. Shortness of breath. Lack of menstrual periods, in women. You have a fever. You make less pee than normal. You have pain or you bleed when you pee or poop. These symptoms may be an emergency. Get help right away. Call your local emergency services (911 in the [place] Do not wait to see if the symptoms will go away. Do not drive yourself to the hospital. Summary Chronic kidney disease is when lasting damage happens to the kidneys slowly  over a long time. Causes of this disease include diabetes and high blood pressure. Often, there is no cure for this disease. Treatment can help symptoms and help keep the disease from getting worse. Treatment may involve lifestyle changes, medicines, and dialysis. This information is not intended to replace advice given to you by your health care provider. Make sure you discuss any questions you have with your health care provider. Document Revised: 02/28/2020 Document Reviewed: 02/28/2020 Elsevier Patient Education  2024 Else[employer] Inc.    Edwina Barth, MD [facility]

## 2023-05-20 NOTE — ED Provider Notes (Signed)
[facility] AT [facility] Provider Note   CSN: [id number] Arrival date & time: 05/19/23  1814     History  Chief Complaint  Patient presents with   Abnormal Lab    Nancy [name] is a 67 y.o. female.  HPI   Patient with medical history including diabetes, hyperlipidemia, CKD's stage V, CHF with an EF of 70% with diastolic heart failure presenting with complaints of abnormal lab work.  Patient states that she was seeing her primary care doctor earlier today, lab work was obtained, she received a phone call from her primary care doctor to go to the ER because her creatinine was high.  She states that overall she has no complaints, she is not endorsing any worsening leg swelling, no orthopnea, no decrease in urinary output, states she has been compliant with her medications, she denies any fevers chills cough congestion she denies any recent nausea vomiting diarrhea states she is tolerating p.o. without any difficulties.  I reviewed patient's chart was recently admitted to the hospital on the seventh and discharged on the 16th of last month, presenting with acute metabolic encephalopathy found to secondary due to elevated ammonia levels from likely constipation, this resolved on its own.  Currently followed by neurology baseline creatinine is around 5.  Home Medications Prior to Admission medications   Medication Sig Start Date End Date Taking? Authorizing Provider  ACCU-CHEK FASTCLIX LANCETS MISC Use to check blood sugar up to 3 times a day 10/11/18   Eulah Pont, MD  ACCU-CHEK GUIDE test strip CHECK BLOOD SUGAR 3 TIMES A DAY 03/30/23   Georgina Quint, MD  atorvastatin (LIPITOR) 40 MG tablet TAKE 1 TABLET BY MOUTH EVERY DAY Patient taking differently: Take 40 mg by mouth daily. 03/30/23   Georgina Quint, MD  Blood Glucose Monitoring Suppl (ACCU-CHEK GUIDE) w/Device KIT 1 each by Does not apply route 3 (three) times daily. Check blood sugar 3 times  a day 07/08/20   Merrilyn Puma, MD  cephALEXin (KEFLEX) 500 MG capsule Take 1 capsule (500 mg total) by mouth 2 (two) times daily for 7 days. 05/14/23 05/21/23  Carlisle Beers, FNP  Cholecalciferol (VITAMIN D3) 25 MCG (1000 UT) CAPS Take 1 capsule by mouth daily.    [provider]  cyclobenzaprine (FLEXERIL) 10 MG tablet TAKE 1 TABLET BY MOUTH EVERYDAY AT BEDTIME 04/23/23   Corwin Levins, MD  dapagliflozin propanediol (FARXIGA) 10 MG TABS tablet TAKE 1 TABLET BY MOUTH EVERY DAY BEFORE BREAKFAST Patient taking differently: Take 10 mg by mouth daily. 02/16/23   Georgina Quint, MD  diclofenac Sodium (VOLTAREN) 1 % GEL Apply 2 g topically 4 (four) times daily. 01/06/22   Adron Bene, MD  DILT-XR 240 MG 24 hr capsule Take 1 capsule (240 mg total) by mouth daily. 12/29/22   Georgina Quint, MD  Dulaglutide (TRULICITY) 1.5 MG/0.5ML SOPN Inject 1.5 mg into the skin once a week. 11/17/22   Georgina Quint, MD  furosemide (LASIX) 40 MG tablet TAKE 1 TABLET BY MOUTH THREE TIMES PER WEEK 04/27/23   Georgina Quint, MD  insulin glargine (LANTUS) 100 UNIT/ML injection INJECT 25 UNITS INTO THE SKIN EVERY MORNING WHEN YOU WAKE UP Patient taking differently: Inject 25 Units into the skin daily. 02/16/23   Georgina Quint, MD  Insulin Syringe-Needle U-100 31G X 15/64" 0.5 ML MISC Use to inject insulin one time a day 07/08/20   Merrilyn Puma, MD  Insulin Syringes, Disposable, U-100  1 ML MISC 2 Syringes by Does not apply route daily. Use to inject insulin into the skin 1 time daily. diag code E11.9. Insulin dependent 07/08/20   Merrilyn Puma, MD  LORazepam (ATIVAN) 1 MG tablet Sig 1 mg 1 hour before procedure as needed. 04/20/22   Georgina Quint, MD  omeprazole (PRILOSEC) 40 MG capsule TAKE 1 CAPSULE BY MOUTH EVERY DAY Patient taking differently: Take 40 mg by mouth daily. 03/30/23   Georgina Quint, MD  senna (SENOKOT) 8.6 MG tablet Take 1 tablet (8.6 mg total) by  mouth as needed for constipation. 07/08/20   Merrilyn Puma, MD      Allergies    Ace inhibitors, Amitriptyline hcl, Aspirin, Latex, and Prednisone    Review of Systems   Review of Systems  Constitutional:  Negative for chills and fever.  Respiratory:  Negative for shortness of breath.   Cardiovascular:  Negative for chest pain.  Gastrointestinal:  Negative for abdominal pain.  Neurological:  Negative for headaches.    Physical Exam Updated Vital Signs BP (!) 157/90 (BP Location: Right Arm)   Pulse 79   Temp 98.2 F (36.8 C) (Oral)   Resp 18   LMP 02/15/2009   SpO2 97%  Physical Exam Vitals and nursing note reviewed.  Constitutional:      General: She is not in acute distress.    Appearance: She is not ill-appearing.  HENT:     Head: Normocephalic and atraumatic.     Nose: No congestion.  Eyes:     Conjunctiva/sclera: Conjunctivae normal.  Cardiovascular:     Rate and Rhythm: Normal rate and regular rhythm.     Pulses: Normal pulses.     Heart sounds: No murmur heard.    No friction rub. No gallop.  Pulmonary:     Effort: No respiratory distress.     Breath sounds: No wheezing, rhonchi or rales.     Comments: Lung sounds are clear bilaterally Abdominal:     Palpations: Abdomen is soft.     Tenderness: There is no abdominal tenderness. There is no right CVA tenderness or left CVA tenderness.  Musculoskeletal:     Comments: No unilateral leg swelling no calf tenderness no palpable cords.  Skin:    General: Skin is warm and dry.  Neurological:     Mental Status: She is alert.  Psychiatric:        Mood and Affect: Mood normal.     ED Results / Procedures / Treatments   Labs (all labs ordered are listed, but only abnormal results are displayed) Labs Reviewed  BASIC METABOLIC PANEL - Abnormal; Notable for the following components:      Result Value   Sodium 129 (*)    Chloride 97 (*)    CO2 18 (*)    Glucose, Bld 329 (*)    BUN 55 (*)    Creatinine, Ser  6.12 (*)    Calcium 8.6 (*)    GFR, Estimated 7 (*)    All other components within normal limits  CBC - Abnormal; Notable for the following components:   WBC 12.1 (*)    Hemoglobin 11.8 (*)    HCT 34.8 (*)    All other components within normal limits  URINALYSIS, ROUTINE W REFLEX MICROSCOPIC - Abnormal; Notable for the following components:   APPearance HAZY (*)    Glucose, UA >=500 (*)    Protein, ur >=300 (*)    Leukocytes,Ua SMALL (*)    Bacteria,  UA FEW (*)    All other components within normal limits  CBG MONITORING, ED - Abnormal; Notable for the following components:   Glucose-Capillary 303 (*)    All other components within normal limits  CBG MONITORING, ED  CBG MONITORING, ED    EKG None  Radiology DG Chest 2 View  Result Date: 05/19/2023 CLINICAL DATA:  Rales in the left lower lobe. EXAM: CHEST - 2 VIEW COMPARISON:  05/19/2023 FINDINGS: The heart size and mediastinal contours are within normal limits. Both lungs are clear. The visualized skeletal structures are unremarkable. IMPRESSION: No active cardiopulmonary disease. Electronically Signed   By: Burman Nieves M.D.   On: 05/19/2023 23:27   DG Chest 2 View  Result Date: 05/19/2023 CLINICAL DATA:  Shortness of breath with exertion. Chest pain. Diabetes. EXAM: CHEST - 2 VIEW COMPARISON:  04/13/2023 FINDINGS: The heart size and mediastinal contours are within normal limits. Both lungs are clear. The visualized skeletal structures are unremarkable. IMPRESSION: No active cardiopulmonary disease. Electronically Signed   By: Paulina Fusi M.D.   On: 05/19/2023 14:25    Procedures Procedures    Medications Ordered in ED Medications - No data to display  ED Course/ Medical Decision Making/ A&P                             Medical Decision Making Amount and/or Complexity of Data Reviewed Labs: ordered. Radiology: ordered.   This patient presents to the ED for concern of elevated creatinine , this involves an  extensive number of treatment options, and is a complaint that carries with it a high risk of complications and morbidity.  The differential diagnosis includes emergent dialysis, CHF, metabolic abnormality    Additional history obtained:  Additional history obtained from N/A External records from outside source obtained and reviewed including recent hospitalization notes   Co morbidities that complicate the patient evaluation  CHF, CKD  Social Determinants of Health:  N/A    Lab Tests:  I Ordered, and personally interpreted labs.  The pertinent results include: CBC shows leukocytosis of 12.1, no [place] anemia hemoglobin of 11.8, BMP reveals sodium of 129 CO2 of 18, glucose 329, BUN 55, creatinine 6.1 UA shows small leukocytes white blood cells few bacteria few squamous cells   Imaging Studies ordered:  I ordered imaging studies including chest x-ray I independently visualized and interpreted imaging which showed negative acute findings I agree with the radiologist interpretation   Cardiac Monitoring:  The patient was maintained on a cardiac monitor.  I personally viewed and interpreted the cardiac monitored which showed an underlying rhythm of: N/A   Medicines ordered and prescription drug management:  I ordered medication including N/A I have reviewed the patients home medicines and have made adjustments as needed  Critical Interventions:  N/A   Reevaluation:  Presents with elevated creatinine, triage obtain basic lab workup which I personally reviewed, will add on chest x-ray and consult with nephrology for further recommendations  I updated the patient on recommendations from nephrology she is agreement this plan is ready for discharge.   Consultations Obtained:  I requested consultation with the Dr. Malen Gauze,  and discussed lab and imaging findings as well as pertinent plan - they recommend: Patient can be discharged home with close patient follow-up, she will  schedule follow-up appoint with the patient.    Test Considered:  N/a    Rule out Suspicion for emergent hemodialysis is low at this  time as there is no electrolyte derangements, patient is not volume overloaded no new oxygen requirements on my exam, patient states that she still making urine without difficulty.  I doubt post bladder obstruction she denies any difficulty urination, she has no suprapubic pain no bladder distention.  Suspicion for UTI, pyelo-, kidney stone is also low at this time she is not endorsing any urinary symptoms, UA is negative for signs infection or hematuria.  Patient does have a slight elevation in her white count 12.1 expect this is likely more acute phase, I doubt this is infectious she is nontoxic-appearing, vital signs are reassuring.  I doubt DKA or HSS as glucose is not significantly elevated, there is no anion gap.  She does have a slight decrease in her CO2 limits likely from dehydration, will have her hydrate orally at home.    Dispostion and problem list  After consideration of the diagnostic results and the patients response to treatment, I feel that the patent would benefit from discharge.  AKI-acute on chronic, possible from overdiuresis, will have her hydrate at home, recommend that she follows up with nephrology, given her strict return precautions.            Final Clinical Impression(s) / ED Diagnoses Final diagnoses:  AKI (acute kidney injury) [facility])    Rx / DC Orders ED Discharge Orders     None         Carroll Sage, PA-C 05/20/23 0057    Nira Conn, MD 05/20/23 [phone]

## 2023-05-20 NOTE — Discharge Instructions (Signed)
Your kidney function is slightly elevated today, please continue with all your home medications.  It is importantly follow-up with your nephrologist, if you do not hear from Dr. Malen Gauze to schedule an appointment by Friday morning please call our office.  If you note that you are having worsening swelling in your legs, feel like you are suffocating while you are laying down, you have decreased urinary output despite taking your Lasix you must come back in for reassessment.

## 2023-06-16 ENCOUNTER — Ambulatory Visit: Payer: 59 | Admitting: Emergency Medicine

## 2023-06-17 ENCOUNTER — Inpatient Hospital Stay (HOSPITAL_COMMUNITY): Payer: 59

## 2023-06-17 ENCOUNTER — Encounter (HOSPITAL_COMMUNITY): Payer: Self-pay

## 2023-06-17 ENCOUNTER — Other Ambulatory Visit: Payer: Self-pay

## 2023-06-17 ENCOUNTER — Emergency Department (HOSPITAL_COMMUNITY): Payer: 59

## 2023-06-17 ENCOUNTER — Inpatient Hospital Stay (HOSPITAL_COMMUNITY)
Admission: EM | Admit: 2023-06-17 | Discharge: 2023-06-19 | DRG: 638 | Disposition: A | Discharge status: Home / Self Care | Payer: 59 | Attending: Internal Medicine | Admitting: Internal Medicine

## 2023-06-17 DIAGNOSIS — Z801 Family history of malignant neoplasm of trachea, bronchus and lung: Secondary | ICD-10-CM

## 2023-06-17 DIAGNOSIS — E785 Hyperlipidemia, unspecified: Secondary | ICD-10-CM | POA: Diagnosis present

## 2023-06-17 DIAGNOSIS — Z800 Family history of malignant neoplasm of digestive organs: Secondary | ICD-10-CM

## 2023-06-17 DIAGNOSIS — E1100 Type 2 diabetes mellitus with hyperosmolarity without nonketotic hyperglycemic-hyperosmolar coma (NKHHC): Principal | ICD-10-CM

## 2023-06-17 DIAGNOSIS — E1142 Type 2 diabetes mellitus with diabetic polyneuropathy: Secondary | ICD-10-CM | POA: Diagnosis present

## 2023-06-17 DIAGNOSIS — R253 Fasciculation: Secondary | ICD-10-CM | POA: Diagnosis present

## 2023-06-17 DIAGNOSIS — E872 Acidosis, unspecified: Secondary | ICD-10-CM | POA: Diagnosis not present

## 2023-06-17 DIAGNOSIS — Z818 Family history of other mental and behavioral disorders: Secondary | ICD-10-CM

## 2023-06-17 DIAGNOSIS — B182 Chronic viral hepatitis C: Secondary | ICD-10-CM | POA: Diagnosis present

## 2023-06-17 DIAGNOSIS — Z91040 Latex allergy status: Secondary | ICD-10-CM

## 2023-06-17 DIAGNOSIS — Z7985 Long-term (current) use of injectable non-insulin antidiabetic drugs: Secondary | ICD-10-CM

## 2023-06-17 DIAGNOSIS — N185 Chronic kidney disease, stage 5: Secondary | ICD-10-CM | POA: Diagnosis not present

## 2023-06-17 DIAGNOSIS — R569 Unspecified convulsions: Secondary | ICD-10-CM

## 2023-06-17 DIAGNOSIS — E1022 Type 1 diabetes mellitus with diabetic chronic kidney disease: Secondary | ICD-10-CM | POA: Diagnosis not present

## 2023-06-17 DIAGNOSIS — K7400 Hepatic fibrosis, unspecified: Secondary | ICD-10-CM | POA: Diagnosis not present

## 2023-06-17 DIAGNOSIS — N184 Chronic kidney disease, stage 4 (severe): Secondary | ICD-10-CM | POA: Diagnosis present

## 2023-06-17 DIAGNOSIS — N186 End stage renal disease: Secondary | ICD-10-CM | POA: Diagnosis not present

## 2023-06-17 DIAGNOSIS — E66811 Obesity, class 1: Secondary | ICD-10-CM

## 2023-06-17 DIAGNOSIS — Z886 Allergy status to analgesic agent status: Secondary | ICD-10-CM

## 2023-06-17 DIAGNOSIS — E669 Obesity, unspecified: Secondary | ICD-10-CM | POA: Diagnosis present

## 2023-06-17 DIAGNOSIS — Z794 Long term (current) use of insulin: Secondary | ICD-10-CM

## 2023-06-17 DIAGNOSIS — E1159 Type 2 diabetes mellitus with other circulatory complications: Secondary | ICD-10-CM | POA: Diagnosis present

## 2023-06-17 DIAGNOSIS — E860 Dehydration: Secondary | ICD-10-CM | POA: Diagnosis present

## 2023-06-17 DIAGNOSIS — D631 Anemia in chronic kidney disease: Secondary | ICD-10-CM | POA: Diagnosis present

## 2023-06-17 DIAGNOSIS — I152 Hypertension secondary to endocrine disorders: Secondary | ICD-10-CM | POA: Diagnosis present

## 2023-06-17 DIAGNOSIS — N179 Acute kidney failure, unspecified: Secondary | ICD-10-CM | POA: Diagnosis not present

## 2023-06-17 DIAGNOSIS — Z803 Family history of malignant neoplasm of breast: Secondary | ICD-10-CM

## 2023-06-17 DIAGNOSIS — I132 Hypertensive heart and chronic kidney disease with heart failure and with stage 5 chronic kidney disease, or end stage renal disease: Secondary | ICD-10-CM | POA: Diagnosis not present

## 2023-06-17 DIAGNOSIS — E1169 Type 2 diabetes mellitus with other specified complication: Secondary | ICD-10-CM | POA: Diagnosis not present

## 2023-06-17 DIAGNOSIS — I5032 Chronic diastolic (congestive) heart failure: Secondary | ICD-10-CM | POA: Diagnosis not present

## 2023-06-17 DIAGNOSIS — Z841 Family history of disorders of kidney and ureter: Secondary | ICD-10-CM

## 2023-06-17 DIAGNOSIS — E1122 Type 2 diabetes mellitus with diabetic chronic kidney disease: Secondary | ICD-10-CM | POA: Diagnosis not present

## 2023-06-17 DIAGNOSIS — Z6834 Body mass index (BMI) 34.0-34.9, adult: Secondary | ICD-10-CM | POA: Diagnosis not present

## 2023-06-17 DIAGNOSIS — R531 Weakness: Secondary | ICD-10-CM | POA: Diagnosis not present

## 2023-06-17 DIAGNOSIS — Z833 Family history of diabetes mellitus: Secondary | ICD-10-CM

## 2023-06-17 DIAGNOSIS — Z8249 Family history of ischemic heart disease and other diseases of the circulatory system: Secondary | ICD-10-CM

## 2023-06-17 DIAGNOSIS — R6889 Other general symptoms and signs: Secondary | ICD-10-CM | POA: Diagnosis not present

## 2023-06-17 DIAGNOSIS — R0989 Other specified symptoms and signs involving the circulatory and respiratory systems: Secondary | ICD-10-CM | POA: Diagnosis not present

## 2023-06-17 DIAGNOSIS — K219 Gastro-esophageal reflux disease without esophagitis: Secondary | ICD-10-CM | POA: Diagnosis present

## 2023-06-17 DIAGNOSIS — Z743 Need for continuous supervision: Secondary | ICD-10-CM | POA: Diagnosis not present

## 2023-06-17 DIAGNOSIS — E1110 Type 2 diabetes mellitus with ketoacidosis without coma: Secondary | ICD-10-CM | POA: Diagnosis not present

## 2023-06-17 DIAGNOSIS — R739 Hyperglycemia, unspecified: Secondary | ICD-10-CM | POA: Diagnosis not present

## 2023-06-17 DIAGNOSIS — B1920 Unspecified viral hepatitis C without hepatic coma: Secondary | ICD-10-CM | POA: Diagnosis present

## 2023-06-17 DIAGNOSIS — E1065 Type 1 diabetes mellitus with hyperglycemia: Secondary | ICD-10-CM | POA: Diagnosis not present

## 2023-06-17 DIAGNOSIS — Z888 Allergy status to other drugs, medicaments and biological substances status: Secondary | ICD-10-CM

## 2023-06-17 DIAGNOSIS — E873 Alkalosis: Secondary | ICD-10-CM | POA: Diagnosis not present

## 2023-06-17 DIAGNOSIS — Z79899 Other long term (current) drug therapy: Secondary | ICD-10-CM

## 2023-06-17 DIAGNOSIS — Z90710 Acquired absence of both cervix and uterus: Secondary | ICD-10-CM

## 2023-06-17 LAB — I-STAT VENOUS BLOOD GAS, ED
Acid-base deficit: 13 mmol/L — ABNORMAL HIGH (ref 0.0–2.0)
Bicarbonate: 13.4 mmol/L — ABNORMAL LOW (ref 20.0–28.0)
Calcium, Ion: 0.98 mmol/L — ABNORMAL LOW (ref 1.15–1.40)
HCT: 30 % — ABNORMAL LOW (ref 36.0–46.0)
Hemoglobin: 10.2 g/dL — ABNORMAL LOW (ref 12.0–15.0)
O2 Saturation: 99 %
Potassium: 4.5 mmol/L (ref 3.5–5.1)
Sodium: 126 mmol/L — ABNORMAL LOW (ref 135–145)
TCO2: 14 mmol/L — ABNORMAL LOW (ref 22–32)
pCO2, Ven: 33.2 mmHg — ABNORMAL LOW (ref 44–60)
pH, Ven: 7.213 — ABNORMAL LOW (ref 7.25–7.43)
pO2, Ven: 148 mmHg — ABNORMAL HIGH (ref 32–45)

## 2023-06-17 LAB — CBC WITH DIFFERENTIAL/PLATELET
Abs Immature Granulocytes: 0.03 10*3/uL (ref 0.00–0.07)
Basophils Absolute: 0.1 10*3/uL (ref 0.0–0.1)
Basophils Relative: 1 %
Eosinophils Absolute: 0.1 10*3/uL (ref 0.0–0.5)
Eosinophils Relative: 1 %
HCT: 30.3 % — ABNORMAL LOW (ref 36.0–46.0)
Hemoglobin: 9.9 g/dL — ABNORMAL LOW (ref 12.0–15.0)
Immature Granulocytes: 0 %
Lymphocytes Relative: 31 %
Lymphs Abs: 2.5 10*3/uL (ref 0.7–4.0)
MCH: 27 pg (ref 26.0–34.0)
MCHC: 32.7 g/dL (ref 30.0–36.0)
MCV: 82.6 fL (ref 80.0–100.0)
Monocytes Absolute: 0.6 10*3/uL (ref 0.1–1.0)
Monocytes Relative: 8 %
Neutro Abs: 4.8 10*3/uL (ref 1.7–7.7)
Neutrophils Relative %: 59 %
Platelets: 215 10*3/uL (ref 150–400)
RBC: 3.67 MIL/uL — ABNORMAL LOW (ref 3.87–5.11)
RDW: 13 % (ref 11.5–15.5)
WBC: 8.1 10*3/uL (ref 4.0–10.5)
nRBC: 0 % (ref 0.0–0.2)

## 2023-06-17 LAB — I-STAT CHEM 8, ED
BUN: 48 mg/dL — ABNORMAL HIGH (ref 8–23)
Calcium, Ion: 0.99 mmol/L — ABNORMAL LOW (ref 1.15–1.40)
Chloride: 97 mmol/L — ABNORMAL LOW (ref 98–111)
Creatinine, Ser: 7.1 mg/dL — ABNORMAL HIGH (ref 0.44–1.00)
Glucose, Bld: >700 mg/dL (ref 70–99)
HCT: 29 % — ABNORMAL LOW (ref 36.0–46.0)
Hemoglobin: 9.9 g/dL — ABNORMAL LOW (ref 12.0–15.0)
Potassium: 4.5 mmol/L (ref 3.5–5.1)
Sodium: 126 mmol/L — ABNORMAL LOW (ref 135–145)
TCO2: 14 mmol/L — ABNORMAL LOW (ref 22–32)

## 2023-06-17 LAB — BASIC METABOLIC PANEL
Anion gap: 20 — ABNORMAL HIGH (ref 5–15)
Anion gap: 17 — ABNORMAL HIGH (ref 5–15)
Anion gap: 16 — ABNORMAL HIGH (ref 5–15)
BUN: 54 mg/dL — ABNORMAL HIGH (ref 8–23)
BUN: 49 mg/dL — ABNORMAL HIGH (ref 8–23)
BUN: 48 mg/dL — ABNORMAL HIGH (ref 8–23)
CO2: 13 mmol/L — ABNORMAL LOW (ref 22–32)
CO2: 21 mmol/L — ABNORMAL LOW (ref 22–32)
CO2: 21 mmol/L — ABNORMAL LOW (ref 22–32)
Calcium: 7.7 mg/dL — ABNORMAL LOW (ref 8.9–10.3)
Calcium: 8.5 mg/dL — ABNORMAL LOW (ref 8.9–10.3)
Calcium: 8.6 mg/dL — ABNORMAL LOW (ref 8.9–10.3)
Chloride: 93 mmol/L — ABNORMAL LOW (ref 98–111)
Chloride: 99 mmol/L (ref 98–111)
Chloride: 101 mmol/L (ref 98–111)
Creatinine, Ser: 6.65 mg/dL — ABNORMAL HIGH (ref 0.44–1.00)
Creatinine, Ser: 6.01 mg/dL — ABNORMAL HIGH (ref 0.44–1.00)
Creatinine, Ser: 5.8 mg/dL — ABNORMAL HIGH (ref 0.44–1.00)
GFR, Estimated: 6 mL/min — ABNORMAL LOW (ref >60)
GFR, Estimated: 7 mL/min — ABNORMAL LOW (ref >60)
GFR, Estimated: 8 mL/min — ABNORMAL LOW (ref >60)
Glucose, Bld: 917 mg/dL (ref 70–99)
Glucose, Bld: 162 mg/dL — ABNORMAL HIGH (ref 70–99)
Glucose, Bld: 169 mg/dL — ABNORMAL HIGH (ref 70–99)
Potassium: 4.5 mmol/L (ref 3.5–5.1)
Potassium: 3.5 mmol/L (ref 3.5–5.1)
Potassium: 3.8 mmol/L (ref 3.5–5.1)
Sodium: 126 mmol/L — ABNORMAL LOW (ref 135–145)
Sodium: 137 mmol/L (ref 135–145)
Sodium: 138 mmol/L (ref 135–145)

## 2023-06-17 LAB — BETA-HYDROXYBUTYRIC ACID
Beta-Hydroxybutyric Acid: 0.11 mmol/L (ref 0.05–0.27)
Beta-Hydroxybutyric Acid: 0.09 mmol/L (ref 0.05–0.27)

## 2023-06-17 LAB — URINALYSIS, ROUTINE W REFLEX MICROSCOPIC
Bilirubin Urine: NEGATIVE
Glucose, UA: >=500 mg/dL — AB
Hgb urine dipstick: NEGATIVE
Ketones, ur: NEGATIVE mg/dL
Leukocytes,Ua: NEGATIVE
Nitrite: NEGATIVE
Protein, ur: 100 mg/dL — AB
Specific Gravity, Urine: 1.014 (ref 1.005–1.030)
pH: 6 (ref 5.0–8.0)

## 2023-06-17 LAB — HEPATIC FUNCTION PANEL
ALT: 14 U/L (ref 0–44)
AST: 13 U/L — ABNORMAL LOW (ref 15–41)
Albumin: 2.9 g/dL — ABNORMAL LOW (ref 3.5–5.0)
Alkaline Phosphatase: 91 U/L (ref 38–126)
Bilirubin, Direct: <0.1 mg/dL (ref 0.0–0.2)
Total Bilirubin: 0.4 mg/dL (ref 0.3–1.2)
Total Protein: 6.6 g/dL (ref 6.5–8.1)

## 2023-06-17 LAB — RAPID URINE DRUG SCREEN, HOSP PERFORMED
Amphetamines: NOT DETECTED
Barbiturates: NOT DETECTED
Benzodiazepines: POSITIVE — AB
Cocaine: NOT DETECTED
Opiates: NOT DETECTED
Tetrahydrocannabinol: NOT DETECTED

## 2023-06-17 LAB — GLUCOSE, CAPILLARY
Glucose-Capillary: 156 mg/dL — ABNORMAL HIGH (ref 70–99)
Glucose-Capillary: 165 mg/dL — ABNORMAL HIGH (ref 70–99)
Glucose-Capillary: 168 mg/dL — ABNORMAL HIGH (ref 70–99)
Glucose-Capillary: 193 mg/dL — ABNORMAL HIGH (ref 70–99)

## 2023-06-17 LAB — CBG MONITORING, ED
Glucose-Capillary: >600 mg/dL (ref 70–99)
Glucose-Capillary: >600 mg/dL (ref 70–99)
Glucose-Capillary: >600 mg/dL (ref 70–99)
Glucose-Capillary: 579 mg/dL (ref 70–99)
Glucose-Capillary: 443 mg/dL — ABNORMAL HIGH (ref 70–99)

## 2023-06-17 LAB — OSMOLALITY: Osmolality: 333 mOsm/kg (ref 275–295)

## 2023-06-17 LAB — AMMONIA: Ammonia: 13 umol/L (ref 9–35)

## 2023-06-17 MED ORDER — ONDANSETRON HCL 4 MG/2ML IJ SOLN
4.0000 mg | Freq: Four times a day (QID) | INTRAMUSCULAR | Status: DC | PRN
  Administered 2023-06-17 – 2023-06-19 (×2): 4 mg via INTRAVENOUS
  Filled 2023-06-17 (×2): qty 2

## 2023-06-17 MED ORDER — SODIUM CHLORIDE 0.9 % IV SOLN
1250.0000 mg | Freq: Once | INTRAVENOUS | Status: AC
  Administered 2023-06-18: 1250 mg via INTRAVENOUS
  Filled 2023-06-17 (×2): qty 25

## 2023-06-17 MED ORDER — LACTATED RINGERS IV BOLUS
1000.0000 mL | Freq: Once | INTRAVENOUS | Status: AC
  Administered 2023-06-17: 1000 mL via INTRAVENOUS

## 2023-06-17 MED ORDER — ATORVASTATIN CALCIUM 40 MG PO TABS
40.0000 mg | ORAL_TABLET | Freq: Every day | ORAL | Status: DC
  Administered 2023-06-17 – 2023-06-19 (×3): 40 mg via ORAL
  Filled 2023-06-17 (×3): qty 1

## 2023-06-17 MED ORDER — POTASSIUM CHLORIDE 10 MEQ/100ML IV SOLN
10.0000 meq | INTRAVENOUS | Status: AC
  Administered 2023-06-17 (×2): 10 meq via INTRAVENOUS
  Filled 2023-06-17 (×2): qty 100

## 2023-06-17 MED ORDER — CYCLOBENZAPRINE HCL 10 MG PO TABS
10.0000 mg | ORAL_TABLET | Freq: Every day | ORAL | Status: DC
  Administered 2023-06-17 – 2023-06-18 (×2): 10 mg via ORAL
  Filled 2023-06-17 (×2): qty 1

## 2023-06-17 MED ORDER — PANTOPRAZOLE SODIUM 40 MG PO TBEC
40.0000 mg | DELAYED_RELEASE_TABLET | Freq: Every day | ORAL | Status: DC
  Administered 2023-06-17 – 2023-06-19 (×3): 40 mg via ORAL
  Filled 2023-06-17 (×3): qty 1

## 2023-06-17 MED ORDER — DEXTROSE 50 % IV SOLN: 0.0000 mL | INTRAVENOUS | Status: DC | PRN

## 2023-06-17 MED ORDER — ONDANSETRON HCL 4 MG PO TABS: 4.0000 mg | ORAL_TABLET | Freq: Four times a day (QID) | ORAL | Status: DC | PRN

## 2023-06-17 MED ORDER — PHENYTOIN 50 MG PO CHEW
100.0000 mg | CHEWABLE_TABLET | Freq: Three times a day (TID) | ORAL | Status: DC
  Administered 2023-06-17 – 2023-06-19 (×5): 100 mg via ORAL
  Filled 2023-06-17 (×7): qty 2

## 2023-06-17 MED ORDER — CALCIUM GLUCONATE-NACL 1-0.675 GM/50ML-% IV SOLN
1.0000 g | Freq: Once | INTRAVENOUS | Status: AC
  Administered 2023-06-18: 1000 mg via INTRAVENOUS
  Filled 2023-06-17: qty 50

## 2023-06-17 MED ORDER — DILTIAZEM HCL ER COATED BEADS 240 MG PO CP24
240.0000 mg | ORAL_CAPSULE | Freq: Every day | ORAL | Status: DC
  Administered 2023-06-18 – 2023-06-19 (×2): 240 mg via ORAL
  Filled 2023-06-17 (×3): qty 1

## 2023-06-17 MED ORDER — DEXTROSE IN LACTATED RINGERS 5 % IV SOLN: INTRAVENOUS | Status: DC

## 2023-06-17 MED ORDER — LACTATED RINGERS IV SOLN: INTRAVENOUS | Status: DC

## 2023-06-17 MED ORDER — INSULIN REGULAR(HUMAN) IN NACL 100-0.9 UT/100ML-% IV SOLN
INTRAVENOUS | Status: DC
  Administered 2023-06-17: 9 [IU]/h via INTRAVENOUS
  Filled 2023-06-17: qty 100

## 2023-06-17 MED ORDER — HEPARIN SODIUM (PORCINE) 5000 UNIT/ML IJ SOLN
5000.0000 [IU] | Freq: Two times a day (BID) | INTRAMUSCULAR | Status: DC
  Administered 2023-06-17 – 2023-06-19 (×4): 5000 [IU] via SUBCUTANEOUS
  Filled 2023-06-17 (×4): qty 1

## 2023-06-17 MED ORDER — LACTATED RINGERS IV BOLUS: 20.0000 mL/kg | Freq: Once | INTRAVENOUS | Status: DC

## 2023-06-17 MED ORDER — MIDAZOLAM HCL (PF) 10 MG/2ML IJ SOLN
INTRAMUSCULAR | Status: AC
  Administered 2023-06-17: 2.5 mg
  Filled 2023-06-17: qty 2

## 2023-06-17 MED ORDER — SODIUM CHLORIDE 0.9 % IV SOLN: 20.0000 mg/kg | Freq: Once | INTRAVENOUS | Status: DC

## 2023-06-17 MED ORDER — SENNA 8.6 MG PO TABS: 1.0000 | ORAL_TABLET | ORAL | Status: DC | PRN

## 2023-06-17 MED ORDER — LINAGLIPTIN 5 MG PO TABS
5.0000 mg | ORAL_TABLET | Freq: Every day | ORAL | Status: DC
Start: 2023-06-17 — End: 2023-06-17

## 2023-06-17 NOTE — ED Notes (Signed)
ED TO INPATIENT HANDOFF REPORT  ED Nurse Name and Phone #: Cyndy Freeze [phone]  S Name/Age/Gender Nancy [name] 67 y.o. female Room/Bed: 015C/015C  Code Status   Code Status: Full Code  Home/SNF/Other Home Patient oriented to: self, place, time, and situation Is this baseline? Yes   Triage Complete: Triage complete  Chief Complaint Hyperosmolar hyperglycemic state (HHS) (HCC) [E11.00]  Triage Note EMS called for high CGB >600, 2 seizures in route for EMS, given 2.5 of Versed by RN upon arrival, after seizing for about 3 minutes   Allergies Allergies  Allergen Reactions   Ace Inhibitors Other (See Comments) and Cough    Persistent dry cough   Amitriptyline Hcl Nausea And Vomiting and Other (See Comments)    GI Intolerance/Upset stomach   Aspirin Other (See Comments)    GI Intolerance   Latex Itching   Prednisone Nausea And Vomiting    Level of Care/Admitting Diagnosis ED Disposition     ED Disposition  Admit   Condition  --   Comment  Hospital Area: [facility] [[id number]]  Level of Care: Progressive [102]  Admit to Progressive based on following criteria: GI, ENDOCRINE disease patients with GI bleeding, acute liver failure or pancreatitis, stable with diabetic ketoacidosis or thyrotoxicosis (hypothyroid) state.  May admit patient to [facility] or [facility] if equivalent level of care is available:: No  Covid Evaluation: Asymptomatic - no recent exposure (last 10 days) testing not required  Diagnosis: Hyperosmolar hyperglycemic state (HHS) [facility]) [[id number]]  Admitting Physician: Em[facility] General [[id number]]  Attending Physician: [facility] General [[id number]]  Certification:: I certify this patient will need inpatient services for at least 2 midnights  Estimated Length of Stay: 2          B Medical/Surgery History Past Medical History:  Diagnosis Date   Carpal tunnel syndrome, bilateral    confirmed on nerve conduction studies   Chronic hepatitis C  (HCC)    considered cured 06/2016 post retroviral therapy    Diabetes mellitus type 2, uncontrolled    Diabetic peripheral neuropathy (HCC)    GERD (gastroesophageal reflux disease)    History of endometriosis    History of Helicobacter infection 11/07   Hyperlipidemia    Liver fibrosis 02/17/2016   F2/3    Past Surgical History:  Procedure Laterality Date   ABDOMINAL HYSTERECTOMY     CARPAL TUNNEL RELEASE       A IV Location/Drains/Wounds Patient Lines/Drains/Airways Status     Active Line/Drains/Airways     Name Placement date Placement time Site Days   Peripheral IV 04/20/23 22 G 1.75" Left;Posterior Forearm 04/20/23  2107  Forearm  58   Peripheral IV 06/17/23 18 G Left Antecubital 06/17/23  1418  Antecubital  less than 1            Intake/Output Last 24 hours  Intake/Output Summary (Last 24 hours) at 06/17/2023 1824 Last data filed at 06/17/2023 1756 Gross per 24 hour  Intake 19.22 ml  Output --  Net 19.22 ml    Labs/Imaging Results for orders placed or performed during the hospital encounter of 06/17/23 (from the past 48 hour(s))  CBG monitoring, ED     Status: Abnormal   Collection Time: 06/17/23  2:10 PM  Result Value Ref Range   Glucose-Capillary >600 (HH) 70 - 99 mg/dL    Comment: Glucose reference range applies only to samples taken after fasting for at least 8 hours.  Basic metabolic panel  Status: Abnormal   Collection Time: 06/17/23  2:23 PM  Result Value Ref Range   Sodium 126 (L) 135 - 145 mmol/L   Potassium 4.5 3.5 - 5.1 mmol/L   Chloride 93 (L) 98 - 111 mmol/L   CO2 13 (L) 22 - 32 mmol/L   Glucose, Bld 917 (HH) 70 - 99 mg/dL    Comment: CRITICAL RESULT CALLED TO, READ BACK BY AND VERIFIED WITH P. BLANCHARD, RN AT 1539 07.11.24 D. BLU Glucose reference range applies only to samples taken after fasting for at least 8 hours.    BUN 54 (H) 8 - 23 mg/dL   Creatinine, Ser 1.61 (H) 0.44 - 1.00 mg/dL   Calcium 7.7 (L) 8.9 - 10.3 mg/dL   GFR,  Estimated 6 (L) >60 mL/min    Comment: (NOTE) Calculated using the CKD-EPI Creatinine Equation (2021)    Anion gap 20 (H) 5 - 15    Comment: ELECTROLYTES REPEATED TO VERIFY Performed at [facility], [address], [place], [place] [address]   Osmolality     Status: Abnormal   Collection Time: 06/17/23  2:23 PM  Result Value Ref Range   Osmolality 333 (HH) 275 - 295 mOsm/kg    Comment: REPEATED TO VERIFY CRITICAL RESULT CALLED TO, READ BACK BY AND VERIFIED WITH: Job Founds RN 1538 06/17/2023 SANDOVAL K  Performed at [facility], [address] [place] [address], [place], [place] [address]   CBC with Differential (PNL)     Status: Abnormal   Collection Time: 06/17/23  2:23 PM  Result Value Ref Range   WBC 8.1 4.0 - 10.5 K/uL   RBC 3.67 (L) 3.87 - 5.11 MIL/uL   Hemoglobin 9.9 (L) 12.0 - 15.0 g/dL   HCT 11.9 (L) 14.7 - 82.9 %   MCV 82.6 80.0 - 100.0 fL   MCH 27.0 26.0 - 34.0 pg   MCHC 32.7 30.0 - 36.0 g/dL   RDW 56.2 13.0 - 86.5 %   Platelets 215 150 - 400 K/uL   nRBC 0.0 0.0 - 0.2 %   Neutrophils Relative % 59 %   Neutro Abs 4.8 1.7 - 7.7 K/uL   Lymphocytes Relative 31 %   Lymphs Abs 2.5 0.7 - 4.0 K/uL   Monocytes Relative 8 %   Monocytes Absolute 0.6 0.1 - 1.0 K/uL   Eosinophils Relative 1 %   Eosinophils Absolute 0.1 0.0 - 0.5 K/uL   Basophils Relative 1 %   Basophils Absolute 0.1 0.0 - 0.1 K/uL   Immature Granulocytes 0 %   Abs Immature Granulocytes 0.03 0.00 - 0.07 K/uL    Comment: Performed at [facility], [address], [place], [place] [address]  Beta-hydroxybutyric acid     Status: None   Collection Time: 06/17/23  2:24 PM  Result Value Ref Range   Beta-Hydroxybutyric Acid 0.11 0.05 - 0.27 mmol/L    Comment: Performed at [facility], [address], [place], [place] [address]  I-Stat Chem 8, ED     Status: Abnormal   Collection Time: 06/17/23  2:51 PM  Result Value Ref Range   Sodium 126 (L) 135 - 145 mmol/L   Potassium 4.5 3.5 - 5.1 mmol/L    Chloride 97 (L) 98 - 111 mmol/L   BUN 48 (H) 8 - 23 mg/dL   Creatinine, Ser 8.41 (H) 0.44 - 1.00 mg/dL   Glucose, Bld >324 (HH) 70 - 99 mg/dL    Comment: Glucose reference range applies  only to samples taken after fasting for at least 8 hours.   Calcium, Ion 0.99 (L) 1.15 - 1.40 mmol/L   TCO2 14 (L) 22 - 32 mmol/L   Hemoglobin 9.9 (L) 12.0 - 15.0 g/dL   HCT 13.0 (L) 86.5 - 78.4 %   Comment NOTIFIED PHYSICIAN   CBG monitoring, ED     Status: Abnormal   Collection Time: 06/17/23  3:35 PM  Result Value Ref Range   Glucose-Capillary >600 (HH) 70 - 99 mg/dL    Comment: Glucose reference range applies only to samples taken after fasting for at least 8 hours.  I-Stat venous blood gas, ([facility], [facility], [facility])     Status: Abnormal   Collection Time: 06/17/23  3:38 PM  Result Value Ref Range   pH, Ven 7.213 (L) 7.25 - 7.43   pCO2, Ven 33.2 (L) 44 - 60 mmHg   pO2, Ven 148 (H) 32 - 45 mmHg   Bicarbonate 13.4 (L) 20.0 - 28.0 mmol/L   TCO2 14 (L) 22 - 32 mmol/L   O2 Saturation 99 %   Acid-base deficit 13.0 (H) 0.0 - 2.0 mmol/L   Sodium 126 (L) 135 - 145 mmol/L   Potassium 4.5 3.5 - 5.1 mmol/L   Calcium, Ion 0.98 (L) 1.15 - 1.40 mmol/L   HCT 30.0 (L) 36.0 - 46.0 %   Hemoglobin 10.2 (L) 12.0 - 15.0 g/dL   Sample type VENOUS   CBG monitoring, ED     Status: Abnormal   Collection Time: 06/17/23  4:26 PM  Result Value Ref Range   Glucose-Capillary >600 (HH) 70 - 99 mg/dL    Comment: Glucose reference range applies only to samples taken after fasting for at least 8 hours.  Urinalysis, Routine w reflex microscopic -Urine, Clean Catch     Status: Abnormal   Collection Time: 06/17/23  5:02 PM  Result Value Ref Range   Color, Urine STRAW (A) YELLOW   APPearance CLEAR CLEAR   Specific Gravity, Urine 1.014 1.005 - 1.030   pH 6.0 5.0 - 8.0   Glucose, UA >=500 (A) NEGATIVE mg/dL   Hgb urine dipstick NEGATIVE NEGATIVE   Bilirubin Urine NEGATIVE NEGATIVE   Ketones, ur NEGATIVE NEGATIVE mg/dL   Protein, ur  696 (A) NEGATIVE mg/dL   Nitrite NEGATIVE NEGATIVE   Leukocytes,Ua NEGATIVE NEGATIVE   RBC / HPF 0-5 0 - 5 RBC/hpf   WBC, UA 0-5 0 - 5 WBC/hpf   Bacteria, UA RARE (A) NONE SEEN   Squamous Epithelial / HPF 0-5 0 - 5 /HPF   Mucus PRESENT    Budding Yeast PRESENT     Comment: Performed at [facility], [address], [place], [place] [address]  Rapid urine drug screen (hospital performed)     Status: Abnormal   Collection Time: 06/17/23  5:02 PM  Result Value Ref Range   Opiates NONE DETECTED NONE DETECTED   Cocaine NONE DETECTED NONE DETECTED   Benzodiazepines POSITIVE (A) NONE DETECTED   Amphetamines NONE DETECTED NONE DETECTED   Tetrahydrocannabinol NONE DETECTED NONE DETECTED   Barbiturates NONE DETECTED NONE DETECTED    Comment: (NOTE) DRUG SCREEN FOR MEDICAL PURPOSES ONLY.  IF CONFIRMATION IS NEEDED FOR ANY PURPOSE, NOTIFY LAB WITHIN 5 DAYS.  LOWEST DETECTABLE LIMITS FOR URINE DRUG SCREEN Drug Class                     Cutoff (ng/mL) Amphetamine and metabolites    1000 Barbiturate and  metabolites    200 Benzodiazepine                 200 Opiates and metabolites        300 Cocaine and metabolites        300 THC                            50 Performed at [facility], [address], [place], [place] [address]   CBG monitoring, ED     Status: Abnormal   Collection Time: 06/17/23  5:06 PM  Result Value Ref Range   Glucose-Capillary 579 (HH) 70 - 99 mg/dL    Comment: Glucose reference range applies only to samples taken after fasting for at least 8 hours.   Comment 1 Notify RN    Comment 2 Document in Chart   CBG monitoring, ED     Status: Abnormal   Collection Time: 06/17/23  5:48 PM  Result Value Ref Range   Glucose-Capillary 443 (H) 70 - 99 mg/dL    Comment: Glucose reference range applies only to samples taken after fasting for at least 8 hours.   CT Head Wo Contrast  Result Date: 06/17/2023 CLINICAL DATA:  Seizures EXAM: CT HEAD WITHOUT CONTRAST  TECHNIQUE: Contiguous axial images were obtained from the base of the skull through the vertex without intravenous contrast. RADIATION DOSE REDUCTION: This exam was performed according to the departmental dose-optimization program which includes automated exposure control, adjustment of the mA and/or kV according to patient size and/or use of iterative reconstruction technique. COMPARISON:  04/13/2023 FINDINGS: Brain: No acute intracranial findings are seen. There are no signs of bleeding within the cranium. Cortical sulci are prominent. Vascular: Unremarkable. Skull: No acute findings are seen. Sinuses/Orbits: There is mucous retention cyst in right maxillary sinus. Other: None. IMPRESSION: No acute intracranial findings are seen.  Atrophy. Chronic sinusitis. Electronically Signed   By: Ernie Avena M.D.   On: 06/17/2023 15:59   DG Chest Portable 1 View  Result Date: 06/17/2023 CLINICAL DATA:  Hyperglycemia. EXAM: PORTABLE CHEST 1 VIEW COMPARISON:  05/19/2023. FINDINGS: Low lung volumes accentuate the pulmonary vasculature and cardiomediastinal silhouette. No consolidation or pulmonary edema. No pleural effusion or pneumothorax. IMPRESSION: Low lung volumes without evidence of acute cardiopulmonary disease. Electronically Signed   By: Orvan Falconer M.D.   On: 06/17/2023 15:10    Pending Labs Unresulted Labs (From admission, onward)     Start     Ordered   06/18/23 0500  Basic metabolic panel  Tomorrow morning,   R        06/17/23 1740   06/18/23 0500  CBC  Tomorrow morning,   R        06/17/23 1740   06/18/23 0500  Phenytoin level, total  Tomorrow morning,   R        06/17/23 1741   06/18/23 0500  Blood gas, venous  Tomorrow morning,   R        06/17/23 1807   06/17/23 1622  Ammonia  ONCE - STAT,   STAT        06/17/23 1621   06/17/23 1621  Hepatic function panel  Add-on,   AD        06/17/23 1620   06/17/23 1424  Beta-hydroxybutyric acid  (Hyperglycemic Hyperosmolar State (HHS))  Now  then every 8 hours,   STAT (with TIMED, URGENT occurrences)      06/17/23  1423   06/17/23 1423  Basic metabolic panel  (Hyperglycemic Hyperosmolar State (HHS))  STAT Now then every 4 hours ,   STAT      06/17/23 1423            Vitals/Pain Today's Vitals   06/17/23 1423 06/17/23 1430 06/17/23 1445 06/17/23 1500  BP: (!) 162/82 (!) 146/98 (!) 149/122   Pulse: 83 77 70 66  Resp: 18 (!) 25 (!) 23 (!) 22  Temp: 98.1 F (36.7 C)     TempSrc: Oral     SpO2: 99% 97% 96% 98%  PainSc: 0-No pain       Isolation Precautions No active isolations  Medications Medications  insulin regular, human (MYXREDLIN) 100 units/ 100 mL infusion (5.5 Units/hr Intravenous Rate/Dose Change 06/17/23 1754)  lactated ringers infusion (has no administration in time range)  dextrose 5 % in lactated ringers infusion (has no administration in time range)  dextrose 50 % solution 0-50 mL (has no administration in time range)  phenytoin (DILANTIN) chewable tablet 100 mg (has no administration in time range)  calcium gluconate 1 g/ 50 mL sodium chloride IVPB (has no administration in time range)  atorvastatin (LIPITOR) tablet 40 mg (40 mg Oral Given 06/17/23 1818)  diltiazem (DILACOR XR) 24 hr capsule 240 mg (has no administration in time range)  pantoprazole (PROTONIX) EC tablet 40 mg (40 mg Oral Given 06/17/23 1818)  senna (SENOKOT) tablet 8.6 mg (has no administration in time range)  cyclobenzaprine (FLEXERIL) tablet 10 mg (has no administration in time range)  heparin injection 5,000 Units (has no administration in time range)  ondansetron (ZOFRAN) tablet 4 mg (has no administration in time range)    Or  ondansetron (ZOFRAN) injection 4 mg (has no administration in time range)  fosPHENYtoin (CEREBYX) 1,250 mg PE in sodium chloride 0.9 % 50 mL IVPB (has no administration in time range)  midazolam PF (VERSED) 10 MG/2ML injection (2.5 mg  Given 06/17/23 1418)  lactated ringers bolus 1,000 mL (0 mLs Intravenous  Stopped 06/17/23 1702)  potassium chloride 10 mEq in 100 mL IVPB (10 mEq Intravenous New Bag/Given 06/17/23 1714)  lactated ringers bolus 1,000 mL (0 mLs Intravenous Stopped 06/17/23 1716)  lactated ringers bolus 1,000 mL (0 mLs Intravenous Stopped 06/17/23 1756)  lactated ringers bolus 1,000 mL (1,000 mLs Intravenous New Bag/Given 06/17/23 1755)    Mobility walks with person assist     Focused Assessments Cardiac Assessment Handoff:    Lab Results  Component Value Date   CKTOTAL 88 09/10/2009   CKMB 1.4 09/10/2009   TROPONINI <0.01        NO INDICATION OF MYOCARDIAL INJURY. 09/10/2009   Lab Results  Component Value Date   DDIMER 0.43 04/19/2018   Does the Patient currently have chest pain? No    R Recommendations: See Admitting Provider Note  Report given to:   Additional Notes: .

## 2023-06-17 NOTE — ED Notes (Signed)
Last liter of fluid boluses running, floor will get lab to draw upon completion.

## 2023-06-17 NOTE — Progress Notes (Signed)
LTM EEG hooked up and running - no initial skin breakdown - push button tested - [facility] monitoring.  

## 2023-06-17 NOTE — ED Notes (Signed)
CBG HI  RN and MD notified

## 2023-06-17 NOTE — ED Provider Notes (Signed)
[place] [facility] AT [facility] Provider Note   CSN: [id number] Arrival date & time: 06/17/23  1402     History  Chief Complaint  Patient presents with   Hyperglycemia   Seizures    Nancy [name] is a 67 y.o. female.  67 year old female with a history of stage V CKD, heart failure with preserved ejection fraction, and insulin-dependent diabetes who presents emergency department with seizures.  Says that for the past 2 to 3 weeks has been having intermittent shaking of her left upper and left lower extremity.  Has retained awareness with this.  Typically last minutes at a time.  Today had 3 episodes and called 911.  Blood sugar has also been undetectably high recently over the past 2 weeks she is unsure why.  Took all of her medications except for insulin today.  No history of seizures.  Not on any antiepileptics.  Per chart review does not appear to be on dialysis at this time.  No alcohol use or recreational drug use.       Home Medications Prior to Admission medications   Medication Sig Start Date End Date Taking? Authorizing Provider  ACCU-CHEK FASTCLIX LANCETS MISC Use to check blood sugar up to 3 times a day 10/11/18   Eulah Pont, MD  ACCU-CHEK GUIDE test strip CHECK BLOOD SUGAR 3 TIMES A DAY 03/30/23   Georgina Quint, MD  atorvastatin (LIPITOR) 40 MG tablet TAKE 1 TABLET BY MOUTH EVERY DAY Patient taking differently: Take 40 mg by mouth daily. 03/30/23   Georgina Quint, MD  Blood Glucose Monitoring Suppl (ACCU-CHEK GUIDE) w/Device KIT 1 each by Does not apply route 3 (three) times daily. Check blood sugar 3 times a day 07/08/20   Merrilyn Puma, MD  Cholecalciferol (VITAMIN D3) 25 MCG (1000 UT) CAPS Take 1 capsule by mouth daily.    [provider]  cyclobenzaprine (FLEXERIL) 10 MG tablet TAKE 1 TABLET BY MOUTH EVERYDAY AT BEDTIME 04/23/23   Corwin Levins, MD  dapagliflozin propanediol (FARXIGA) 10 MG TABS tablet TAKE 1 TABLET BY  MOUTH EVERY DAY BEFORE BREAKFAST Patient taking differently: Take 10 mg by mouth daily. 02/16/23   Georgina Quint, MD  diclofenac Sodium (VOLTAREN) 1 % GEL Apply 2 g topically 4 (four) times daily. 01/06/22   Adron Bene, MD  DILT-XR 240 MG 24 hr capsule Take 1 capsule (240 mg total) by mouth daily. 12/29/22   Georgina Quint, MD  Dulaglutide (TRULICITY) 1.5 MG/0.5ML SOPN Inject 1.5 mg into the skin once a week. 11/17/22   Georgina Quint, MD  furosemide (LASIX) 40 MG tablet TAKE 1 TABLET BY MOUTH THREE TIMES PER WEEK 04/27/23   Georgina Quint, MD  insulin glargine (LANTUS) 100 UNIT/ML injection INJECT 25 UNITS INTO THE SKIN EVERY MORNING WHEN YOU WAKE UP Patient taking differently: Inject 25 Units into the skin daily. 02/16/23   Georgina Quint, MD  Insulin Syringe-Needle U-100 31G X 15/64" 0.5 ML MISC Use to inject insulin one time a day 07/08/20   Merrilyn Puma, MD  Insulin Syringes, Disposable, U-100 1 ML MISC 2 Syringes by Does not apply route daily. Use to inject insulin into the skin 1 time daily. diag code E11.9. Insulin dependent 07/08/20   Merrilyn Puma, MD  LORazepam (ATIVAN) 1 MG tablet Sig 1 mg 1 hour before procedure as needed. 04/20/22   Georgina Quint, MD  omeprazole (PRILOSEC) 40 MG capsule TAKE 1 CAPSULE BY MOUTH EVERY DAY  Patient taking differently: Take 40 mg by mouth daily. 03/30/23   Georgina Quint, MD  senna (SENOKOT) 8.6 MG tablet Take 1 tablet (8.6 mg total) by mouth as needed for constipation. 07/08/20   Merrilyn Puma, MD      Allergies    Ace inhibitors, Amitriptyline hcl, Aspirin, Latex, and Prednisone    Review of Systems   Review of Systems  Physical Exam Updated Vital Signs BP (!) 149/122   Pulse 66   Temp 98.1 F (36.7 C) (Oral)   Resp (!) 22   LMP 02/15/2009   SpO2 98%  Physical Exam Vitals and nursing note reviewed.  Constitutional:      General: She is not in acute distress.    Appearance: She is  well-developed.     Comments: Patient conversant during seizure-like activity.  Has twitching of left upper and left lower extremity with her head turned to the right.  HENT:     Head: Normocephalic and atraumatic.     Right Ear: External ear normal.     Left Ear: External ear normal.     Nose: Nose normal.  Eyes:     Extraocular Movements: Extraocular movements intact.     Conjunctiva/sclera: Conjunctivae normal.     Pupils: Pupils are equal, round, and reactive to light.     Comments: Pupils 4 mm bilaterally  Neck:     Comments: No meningismus Cardiovascular:     Rate and Rhythm: Regular rhythm. Tachycardia present.     Heart sounds: No murmur heard. Pulmonary:     Effort: Pulmonary effort is normal. No respiratory distress.     Breath sounds: Normal breath sounds.  Musculoskeletal:     Cervical back: Normal range of motion and neck supple.  Skin:    General: Skin is warm and dry.  Neurological:     Mental Status: She is alert and oriented to person, place, and time.     Comments: Cranial nerves II through XII intact.  Weakness of left upper and left lower extremity which is new after the seizure per the patient.  4/5 strength in right upper and lower extremity.  Intact sensation to light touch in all other extremities.  Patient started to become drowsy from Versed after the above exam and was unable to cooperate with the remainder of her neurologic exam.  Psychiatric:        Mood and Affect: Mood normal.     ED Results / Procedures / Treatments   Labs (all labs ordered are listed, but only abnormal results are displayed) Labs Reviewed  BASIC METABOLIC PANEL - Abnormal; Notable for the following components:      Result Value   Sodium 126 (*)    Chloride 93 (*)    CO2 13 (*)    Glucose, Bld 917 (*)    BUN 54 (*)    Creatinine, Ser 6.65 (*)    Calcium 7.7 (*)    GFR, Estimated 6 (*)    Anion gap 20 (*)    All other components within normal limits  OSMOLALITY -  Abnormal; Notable for the following components:   Osmolality 333 (*)    All other components within normal limits  CBC WITH DIFFERENTIAL/PLATELET - Abnormal; Notable for the following components:   RBC 3.67 (*)    Hemoglobin 9.9 (*)    HCT 30.3 (*)    All other components within normal limits  CBG MONITORING, ED - Abnormal; Notable for the following components:  Glucose-Capillary >600 (*)    All other components within normal limits  CBG MONITORING, ED - Abnormal; Notable for the following components:   Glucose-Capillary >600 (*)    All other components within normal limits  I-STAT CHEM 8, ED - Abnormal; Notable for the following components:   Sodium 126 (*)    Chloride 97 (*)    BUN 48 (*)    Creatinine, Ser 7.10 (*)    Glucose, Bld >700 (*)    Calcium, Ion 0.99 (*)    TCO2 14 (*)    Hemoglobin 9.9 (*)    HCT 29.0 (*)    All other components within normal limits  I-STAT VENOUS BLOOD GAS, ED - Abnormal; Notable for the following components:   pH, Ven 7.213 (*)    pCO2, Ven 33.2 (*)    pO2, Ven 148 (*)    Bicarbonate 13.4 (*)    TCO2 14 (*)    Acid-base deficit 13.0 (*)    Sodium 126 (*)    Calcium, Ion 0.98 (*)    HCT 30.0 (*)    Hemoglobin 10.2 (*)    All other components within normal limits  CBG MONITORING, ED - Abnormal; Notable for the following components:   Glucose-Capillary >600 (*)    All other components within normal limits  CBG MONITORING, ED - Abnormal; Notable for the following components:   Glucose-Capillary 579 (*)    All other components within normal limits  BETA-HYDROXYBUTYRIC ACID  BASIC METABOLIC PANEL  BASIC METABOLIC PANEL  BASIC METABOLIC PANEL  URINALYSIS, ROUTINE W REFLEX MICROSCOPIC  BETA-HYDROXYBUTYRIC ACID  HEPATIC FUNCTION PANEL  AMMONIA  RAPID URINE DRUG SCREEN, HOSP PERFORMED    EKG EKG Interpretation Date/Time:  Thursday June 17 2023 14:21:38 EDT Ventricular Rate:  85 PR Interval:  169 QRS Duration:  86 QT  Interval:  372 QTC Calculation: 443 R Axis:   88  Text Interpretation: Sinus rhythm Borderline right axis deviation Confirmed by Vonita Moss [phone]) on 06/17/2023 2:24:42 PM  Radiology CT Head Wo Contrast  Result Date: 06/17/2023 CLINICAL DATA:  Seizures EXAM: CT HEAD WITHOUT CONTRAST TECHNIQUE: Contiguous axial images were obtained from the base of the skull through the vertex without intravenous contrast. RADIATION DOSE REDUCTION: This exam was performed according to the departmental dose-optimization program which includes automated exposure control, adjustment of the mA and/or kV according to patient size and/or use of iterative reconstruction technique. COMPARISON:  04/13/2023 FINDINGS: Brain: No acute intracranial findings are seen. There are no signs of bleeding within the cranium. Cortical sulci are prominent. Vascular: Unremarkable. Skull: No acute findings are seen. Sinuses/Orbits: There is mucous retention cyst in right maxillary sinus. Other: None. IMPRESSION: No acute intracranial findings are seen.  Atrophy. Chronic sinusitis. Electronically Signed   By: Ernie Avena M.D.   On: 06/17/2023 15:59   DG Chest Portable 1 View  Result Date: 06/17/2023 CLINICAL DATA:  Hyperglycemia. EXAM: PORTABLE CHEST 1 VIEW COMPARISON:  05/19/2023. FINDINGS: Low lung volumes accentuate the pulmonary vasculature and cardiomediastinal silhouette. No consolidation or pulmonary edema. No pleural effusion or pneumothorax. IMPRESSION: Low lung volumes without evidence of acute cardiopulmonary disease. Electronically Signed   By: Orvan Falconer M.D.   On: 06/17/2023 15:10    Procedures Procedures    Medications Ordered in ED Medications  insulin regular, human (MYXREDLIN) 100 units/ 100 mL infusion (9 Units/hr Intravenous Infusion Verify 06/17/23 1628)  lactated ringers infusion (has no administration in time range)  dextrose 5 % in lactated ringers infusion (  has no administration in time range)   dextrose 50 % solution 0-50 mL (has no administration in time range)  potassium chloride 10 mEq in 100 mL IVPB (10 mEq Intravenous New Bag/Given 06/17/23 1714)  lactated ringers bolus 1,000 mL (has no administration in time range)  fosPHENYtoin (CEREBYX) 20 mg PE/kg in sodium chloride 0.9 % 50 mL IVPB (has no administration in time range)  phenytoin (DILANTIN) chewable tablet 100 mg (has no administration in time range)  midazolam PF (VERSED) 10 MG/2ML injection (2.5 mg  Given 06/17/23 1418)  lactated ringers bolus 1,000 mL (0 mLs Intravenous Stopped 06/17/23 1702)  lactated ringers bolus 1,000 mL (0 mLs Intravenous Stopped 06/17/23 1716)  lactated ringers bolus 1,000 mL (1,000 mLs Intravenous New Bag/Given 06/17/23 1717)    ED Course/ Medical Decision Making/ A&P Clinical Course as of 06/17/23 1734  Thu Jun 17, 2023  1437 Patient now more awake and alert.  Is full strength in all extremities. [RP]  1542 Hemoglobin(!): 9.9 Baseline of 11 [RP]  1542 Patient had an additional seizure which started in her right upper extremity and then involved all 4 of her extremities.  Was awake and alert during the entire event.  Lasted approximately 2 minutes. [RP]  1544 JD from critical care consulted. They will evaluate for admission.  [RP]  1620 Dr Otelia Limes will start the patient on LTM. No aeds at this time.  [RP]  1623 Dr Merrily Pew from ICU feels pt is stepdown appropriate.  [RP]  1649 Dr Chipper Herb from hospitalist to admit the patient.  [RP]    Clinical Course User Index [RP] Rondel Baton, MD                             Medical Decision Making Amount and/or Complexity of Data Reviewed Labs: ordered. Decision-making details documented in ED Course. Radiology: ordered.  Risk Prescription drug management. Decision regarding hospitalization.   Nancy [name] is a 67 y.o. female with comorbidities that complicate the patient evaluation including V CKD, heart failure with preserved ejection  fraction, and insulin-dependent diabetes who presents emergency department with seizure like activity and elevated blood sugars  Initial Ddx:  HHS, DKA, stroke, meningitis/encephalitis, electrolyte abnormality, epilepsy  MDM/Course:  Patient arrives with seizure-like activity and elevated blood sugars at home.  On exam did appear to have a seizure with retained awareness that was predominantly myoclonic jerking of the left upper and left lower extremity.  Otherwise appears to have a nonfocal neuroexam at this time. The longest seizure-like episode lasted approximately 3 minutes.  Since they appeared to be repeatedly present even though she would go back to her baseline did give her 2.5 mg of Versed.  She became very drowsy after this but did not have any more seizure-like activity for several hours.  Her labs returned and showed that she was in HHS.  Was also found to be acidotic but had a normal BHB.  Patient was given several liters of fluids and was started on insulin.  Discussed with ICU who feels that the patient would be appropriate for stepdown.  Was also discussed with neurology who will coordinate LTM monitoring for the patient.  Upon re-evaluation did have 2 very brief spontaneously resolving seizure-like episodes in the emergency department.  This patient presents to the ED for concern of complaints listed in HPI, this involves an extensive number of treatment options, and is a complaint that carries with it a high risk  of complications and morbidity. Disposition including potential need for admission considered.   Dispo: Admit to Step Down  Additional history obtained from spouse Records reviewed Outpatient Clinic Notes The following labs were independently interpreted: Chemistry and show  HHS I independently reviewed the following imaging with scope of interpretation limited to determining acute life threatening conditions related to emergency care: CT Head and agree with the radiologist  interpretation with the following exceptions: none I personally reviewed and interpreted cardiac monitoring: normal sinus rhythm  I personally reviewed and interpreted the pt's EKG: see above for interpretation  I have reviewed the patients home medications and made adjustments as needed Consults: Critical care, Hospitalist, and Neurology Social Determinants of health:  Elderly   CRITICAL CARE Performed by: Rondel Baton   Total critical care time: 45 minutes  Critical care time was exclusive of separately billable procedures and treating other patients.  Critical care was necessary to treat or prevent imminent or life-threatening deterioration.  Critical care was time spent personally by me on the following activities: development of treatment plan with patient and/or surrogate as well as nursing, discussions with consultants, evaluation of patient's response to treatment, examination of patient, obtaining history from patient or surrogate, ordering and performing treatments and interventions, ordering and review of laboratory studies, ordering and review of radiographic studies, pulse oximetry and re-evaluation of patient's condition.        Final Clinical Impression(s) / ED Diagnoses Final diagnoses:  Hyperosmolar hyperglycemic state (HHS) (HCC)  Seizure-like activity (HCC)  AKI (acute kidney injury) (HCC)  Stage 5 chronic kidney disease not on chronic dialysis [facility])    Rx / DC Orders ED Discharge Orders     None         Rondel Baton, MD 06/17/23 1734

## 2023-06-17 NOTE — Consult Note (Signed)
NAME:  Nancy [name], MRN:  [id number], DOB:  May 01, 1956, LOS: 0 ADMISSION DATE:  06/17/2023, CONSULTATION DATE: 06/17/2023 REFERRING MD:  Rondel Baton , CHIEF COMPLAINT: Seizures  History of Present Illness:  67 year old female with CKD stage V, chronic HFpEF and insulin-dependent diabetes type 2 who was brought to the emergency department after she had 3 episodes of seizures, on workup she was noted to have blood sugar of 700, and route to the emergency department she had 3 minutes of tonic-clonic seizures, she was postictal for some time then woke up, denies any complaint.  She stated that she checked her blood sugar yesterday does not know how much was it, she took her 12 units of Lantus, today she did not take her insulin.  In the ED she was noted to have fingerstick of more than 700 with pH 7.21.  PCCM was consulted for help evaluation medical management Patient denies dizziness, nausea, vomiting, abdominal pain, dysuria, urgency or other complaints  Pertinent  Medical History   Past Medical History:  Diagnosis Date   Carpal tunnel syndrome, bilateral    confirmed on nerve conduction studies   Chronic hepatitis C (HCC)    considered cured 06/2016 post retroviral therapy    Diabetes mellitus type 2, uncontrolled    Diabetic peripheral neuropathy (HCC)    GERD (gastroesophageal reflux disease)    History of endometriosis    History of Helicobacter infection 11/07   Hyperlipidemia    Liver fibrosis 02/17/2016   F2/3      Significant Hospital Events: Including procedures, antibiotic start and stop dates in addition to other pertinent events     Interim History / Subjective:  As above  Objective   Blood pressure (!) 149/122, pulse 66, temperature 98.1 F (36.7 C), temperature source Oral, resp. rate (!) 22, last menstrual period 02/15/2009, SpO2 98%.        Intake/Output Summary (Last 24 hours) at 06/17/2023 1652 Last data filed at 06/17/2023 1628 Gross per 24 hour   Intake 6.52 ml  Output --  Net 6.52 ml   There were no vitals filed for this visit.  Examination: General: Acute on chronically ill-appearing female, lying on the bed HEENT: [place]/AT, eyes anicteric.  Severely dry mucus membranes Neuro: Alert, awake following commands, stuttering, moving all 4 extremities Chest: Coarse breath sounds, no wheezes or rhonchi Heart: Regular rate and rhythm, no murmurs or gallops Abdomen: Soft, nontender, nondistended, bowel sounds present Skin: No rash  Labs and images were reviewed  Resolved Hospital Problem list   N/A  Assessment & Plan:  Diabetes type 2, poorly controlled, presented with hyperosmolar hyperglycemia Severe dehydration AKI on CKD stage V Acute anion gap metabolic acidosis Anemia of chronic disease Chronic HFpEF  Continue aggressive IV fluid resuscitation Continue insulin per Endo tool with hyperosmolar hyperglycemia protocol Hemoglobin A1c is 11.3 She needs adjustment of her insulin at home Head CT is negative She will need EEG Neurology is consulting Monitor intake and output Avoid nephrotoxic agent Monitor BMP Monitor H&H  At this time patient is clinically stable to be admitted to progressive care unit, she does not need ICU level of care.  PCCM will sign off, please call with questions    Best Practice (right click and "Reselect all SmartList Selections" daily)   Per primary team  Labs   CBC: Recent Labs  Lab 06/17/23 1423 06/17/23 1451 06/17/23 1538  WBC 8.1  --   --   NEUTROABS 4.8  --   --  HGB 9.9* 9.9* 10.2*  HCT 30.3* 29.0* 30.0*  MCV 82.6  --   --   PLT 215  --   --     Basic Metabolic Panel: Recent Labs  Lab 06/17/23 1423 06/17/23 1451 06/17/23 1538  NA 126* 126* 126*  K 4.5 4.5 4.5  CL 93* 97*  --   CO2 13*  --   --   GLUCOSE 917* >700*  --   BUN 54* 48*  --   CREATININE 6.65* 7.10*  --   CALCIUM 7.7*  --   --    GFR: CrCl cannot be calculated (Unknown ideal weight.). Recent  Labs  Lab 06/17/23 1423  WBC 8.1    Liver Function Tests: No results for input(s): "AST", "ALT", "ALKPHOS", "BILITOT", "PROT", "ALBUMIN" in the last 168 hours. No results for input(s): "LIPASE", "AMYLASE" in the last 168 hours. No results for input(s): "AMMONIA" in the last 168 hours.  ABG    Component Value Date/Time   HCO3 13.4 (L) 06/17/2023 1538   TCO2 14 (L) 06/17/2023 1538   ACIDBASEDEF 13.0 (H) 06/17/2023 1538   O2SAT 99 06/17/2023 1538     Coagulation Profile: No results for input(s): "INR", "PROTIME" in the last 168 hours.  Cardiac Enzymes: No results for input(s): "CKTOTAL", "CKMB", "CKMBINDEX", "TROPONINI" in the last 168 hours.  HbA1C: Hemoglobin A1C  Date/Time Value Ref Range Status  05/19/2023 01:58 PM 11.5 (A) 4.0 - 5.6 % Final  02/16/2023 03:59 PM 9.4 (A) 4.0 - 5.6 % Final   Hgb A1c MFr Bld  Date/Time Value Ref Range Status  09/04/2010 02:37 PM 10.0 %   06/11/2010 02:17 PM 8.8 %     CBG: Recent Labs  Lab 06/17/23 1410 06/17/23 1535 06/17/23 1626  GLUCAP >600* >600* >600*    Review of Systems:   12 point review of system is significant for complaint mentioned HPI, rest is negative  Past Medical History:  She,  has a past medical history of Carpal tunnel syndrome, bilateral, Chronic hepatitis C (HCC), Diabetes mellitus type 2, uncontrolled, Diabetic peripheral neuropathy (HCC), GERD (gastroesophageal reflux disease), History of endometriosis, History of Helicobacter infection (11/07), Hyperlipidemia, and Liver fibrosis (02/17/2016).   Surgical History:   Past Surgical History:  Procedure Laterality Date   ABDOMINAL HYSTERECTOMY     CARPAL TUNNEL RELEASE       Social History:   reports that she has never smoked. She has never used smokeless tobacco. She reports that she does not drink alcohol and does not use drugs.   Family History:  Her family history includes Breast cancer in her maternal grandmother; Cancer in her mother; Dementia in  her father; Depression in her brother; Diabetes in her father; Heart disease in her brother; Kidney disease in her mother; Liver cancer in her maternal grandfather; Lung cancer in her maternal uncle. There is no history of Colon cancer.   Allergies Allergies  Allergen Reactions   Ace Inhibitors Other (See Comments) and Cough    Persistent dry cough   Amitriptyline Hcl Nausea And Vomiting and Other (See Comments)    GI Intolerance/Upset stomach   Aspirin Other (See Comments)    GI Intolerance   Latex Itching   Prednisone Nausea And Vomiting     Home Medications  Prior to Admission medications   Medication Sig Start Date End Date Taking? Authorizing Provider  ACCU-CHEK FASTCLIX LANCETS MISC Use to check blood sugar up to 3 times a day 10/11/18   Eulah Pont, MD  ACCU-CHEK GUIDE test strip CHECK BLOOD SUGAR 3 TIMES A DAY 03/30/23   Georgina Quint, MD  atorvastatin (LIPITOR) 40 MG tablet TAKE 1 TABLET BY MOUTH EVERY DAY Patient taking differently: Take 40 mg by mouth daily. 03/30/23   Georgina Quint, MD  Blood Glucose Monitoring Suppl (ACCU-CHEK GUIDE) w/Device KIT 1 each by Does not apply route 3 (three) times daily. Check blood sugar 3 times a day 07/08/20   Merrilyn Puma, MD  Cholecalciferol (VITAMIN D3) 25 MCG (1000 UT) CAPS Take 1 capsule by mouth daily.    [provider]  cyclobenzaprine (FLEXERIL) 10 MG tablet TAKE 1 TABLET BY MOUTH EVERYDAY AT BEDTIME 04/23/23   Corwin Levins, MD  dapagliflozin propanediol (FARXIGA) 10 MG TABS tablet TAKE 1 TABLET BY MOUTH EVERY DAY BEFORE BREAKFAST Patient taking differently: Take 10 mg by mouth daily. 02/16/23   Georgina Quint, MD  diclofenac Sodium (VOLTAREN) 1 % GEL Apply 2 g topically 4 (four) times daily. 01/06/22   Adron Bene, MD  DILT-XR 240 MG 24 hr capsule Take 1 capsule (240 mg total) by mouth daily. 12/29/22   Georgina Quint, MD  Dulaglutide (TRULICITY) 1.5 MG/0.5ML SOPN Inject 1.5 mg into the skin once  a week. 11/17/22   Georgina Quint, MD  furosemide (LASIX) 40 MG tablet TAKE 1 TABLET BY MOUTH THREE TIMES PER WEEK 04/27/23   Georgina Quint, MD  insulin glargine (LANTUS) 100 UNIT/ML injection INJECT 25 UNITS INTO THE SKIN EVERY MORNING WHEN YOU WAKE UP Patient taking differently: Inject 25 Units into the skin daily. 02/16/23   Georgina Quint, MD  Insulin Syringe-Needle U-100 31G X 15/64" 0.5 ML MISC Use to inject insulin one time a day 07/08/20   Merrilyn Puma, MD  Insulin Syringes, Disposable, U-100 1 ML MISC 2 Syringes by Does not apply route daily. Use to inject insulin into the skin 1 time daily. diag code E11.9. Insulin dependent 07/08/20   Merrilyn Puma, MD  LORazepam (ATIVAN) 1 MG tablet Sig 1 mg 1 hour before procedure as needed. 04/20/22   Georgina Quint, MD  omeprazole (PRILOSEC) 40 MG capsule TAKE 1 CAPSULE BY MOUTH EVERY DAY Patient taking differently: Take 40 mg by mouth daily. 03/30/23   Georgina Quint, MD  senna (SENOKOT) 8.6 MG tablet Take 1 tablet (8.6 mg total) by mouth as needed for constipation. 07/08/20   Merrilyn Puma, MD     Critical care time:     The patient is critically ill due to hyperosmolar hyperglycemia, seizures due to metabolic abnormalities, AKI on CKD stage V, metabolic acidosis.  Critical care was necessary to treat or prevent imminent or life-threatening deterioration.  Critical care was time spent personally by me on the following activities: development of treatment plan with patient and/or surrogate as well as nursing, discussions with consultants, evaluation of patient's response to treatment, examination of patient, obtaining history from patient or surrogate, ordering and performing treatments and interventions, ordering and review of laboratory studies, ordering and review of radiographic studies, pulse oximetry, re-evaluation of patient's condition and participation in multidisciplinary rounds.   During this encounter  critical care time was devoted to patient care services described in this note for 35 minutes.     Cheri Fowler, MD [facility] See Amion for pager If no response to pager, please call [phone] until 7pm After 7pm, Please call E-link [phone]

## 2023-06-17 NOTE — H&P (Signed)
History and Physical    Nancy [name] ZOX:[id number] DOB: [birth year] DOA: 06/17/2023  PCP: Georgina Quint, MD (Confirm with patient/family/NH records and if not entered, this has to be entered at [facility] point of entry) Patient coming from: Home  I have personally briefly reviewed patient's old medical records in [facility]  Chief Complaint: I had seizure  HPI: Nancy [name] is a 67 y.o. female with medical history significant of IDDM, HTN, CKD stage IV, chronic HFpEF, hepatitis C, GERD, diabetic neuropathy, presented with seizure and altered mentations and hypoglycemia.  Patient has poorly controlled diabetes with most recent A1c> 10, and for diabetes she takes only 25 units of Lantus every morning.  She checks her fingerstick in the morning occasionally and found most readings > 300.  About 2 to 3 weeks ago patient started to have left forearm and left hand shakiness, unquenchable, she usually had to hold her left hand to avoid injuries and.  Those episodes subsided by itself within 5 to 10 minutes.  This morning however patient had severe left forearm and left hand shakiness and felt lightheaded then lost her consciousness, episode was witnessed by her husband at bedside who helped her to hold the left hand to reduce the shakiness and held her back to the bed to avoid fall.  Symptoms lasted about 15 minutes and patient regained consciousness and left-sided shakiness subsided before EMS came.  Then patient patient had another episode in the ambulance, when it was noticed patient again had left-sided arm and leg shakiness, and this time she was given Versed and will check next subsided in 2 to 3 minutes.  Unclear whether this episode also involved of LOC.  Patient denies any weakness numbness or any other limbs no vision problems.  Patient reported still makes urine as usual denies any back pain or dysuria.  ED Course: Initial presentation showed glucose> 900, blood pressure elevated  afebrile.  CT head negative for acute findings.  Blood work showed sodium 127, BUN 48 creatinine 6.6 compared to baseline 6.0-6.1.  Bicarb 14 VBG 7.2 1/33/140  Was given a total of 3 L IV bolus and started on insulin drip  Review of Systems: As per HPI otherwise 14 point review of systems negative.    Past Medical History:  Diagnosis Date   Carpal tunnel syndrome, bilateral    confirmed on nerve conduction studies   Chronic hepatitis C (HCC)    considered cured 06/2016 post retroviral therapy    Diabetes mellitus type 2, uncontrolled    Diabetic peripheral neuropathy (HCC)    GERD (gastroesophageal reflux disease)    History of endometriosis    History of Helicobacter infection 11/07   Hyperlipidemia    Liver fibrosis 02/17/2016   F2/3     Past Surgical History:  Procedure Laterality Date   ABDOMINAL HYSTERECTOMY     CARPAL TUNNEL RELEASE       reports that she has never smoked. She has never used smokeless tobacco. She reports that she does not drink alcohol and does not use drugs.  Allergies  Allergen Reactions   Ace Inhibitors Other (See Comments) and Cough    Persistent dry cough   Amitriptyline Hcl Nausea And Vomiting and Other (See Comments)    GI Intolerance/Upset stomach   Aspirin Other (See Comments)    GI Intolerance   Latex Itching   Prednisone Nausea And Vomiting    Family History  Problem Relation Age of Onset   Cancer Mother  lung   Kidney disease Mother    Lung cancer Maternal Uncle    Breast cancer Maternal Grandmother    Liver cancer Maternal Grandfather    Heart disease Brother    Depression Brother    Diabetes Father    Dementia Father    Colon cancer Neg Hx      Prior to Admission medications   Medication Sig Start Date End Date Taking? Authorizing Provider  ACCU-CHEK FASTCLIX LANCETS MISC Use to check blood sugar up to 3 times a day 10/11/18   Eulah Pont, MD  ACCU-CHEK GUIDE test strip CHECK BLOOD SUGAR 3 TIMES A DAY 03/30/23    Georgina Quint, MD  atorvastatin (LIPITOR) 40 MG tablet TAKE 1 TABLET BY MOUTH EVERY DAY Patient taking differently: Take 40 mg by mouth daily. 03/30/23   Georgina Quint, MD  Blood Glucose Monitoring Suppl (ACCU-CHEK GUIDE) w/Device KIT 1 each by Does not apply route 3 (three) times daily. Check blood sugar 3 times a day 07/08/20   Merrilyn Puma, MD  Cholecalciferol (VITAMIN D3) 25 MCG (1000 UT) CAPS Take 1 capsule by mouth daily.    [provider]  cyclobenzaprine (FLEXERIL) 10 MG tablet TAKE 1 TABLET BY MOUTH EVERYDAY AT BEDTIME 04/23/23   Corwin Levins, MD  dapagliflozin propanediol (FARXIGA) 10 MG TABS tablet TAKE 1 TABLET BY MOUTH EVERY DAY BEFORE BREAKFAST Patient taking differently: Take 10 mg by mouth daily. 02/16/23   Georgina Quint, MD  diclofenac Sodium (VOLTAREN) 1 % GEL Apply 2 g topically 4 (four) times daily. 01/06/22   Adron Bene, MD  DILT-XR 240 MG 24 hr capsule Take 1 capsule (240 mg total) by mouth daily. 12/29/22   Georgina Quint, MD  Dulaglutide (TRULICITY) 1.5 MG/0.5ML SOPN Inject 1.5 mg into the skin once a week. 11/17/22   Georgina Quint, MD  furosemide (LASIX) 40 MG tablet TAKE 1 TABLET BY MOUTH THREE TIMES PER WEEK 04/27/23   Georgina Quint, MD  insulin glargine (LANTUS) 100 UNIT/ML injection INJECT 25 UNITS INTO THE SKIN EVERY MORNING WHEN YOU WAKE UP Patient taking differently: Inject 25 Units into the skin daily. 02/16/23   Georgina Quint, MD  Insulin Syringe-Needle U-100 31G X 15/64" 0.5 ML MISC Use to inject insulin one time a day 07/08/20   Merrilyn Puma, MD  Insulin Syringes, Disposable, U-100 1 ML MISC 2 Syringes by Does not apply route daily. Use to inject insulin into the skin 1 time daily. diag code E11.9. Insulin dependent 07/08/20   Merrilyn Puma, MD  LORazepam (ATIVAN) 1 MG tablet Sig 1 mg 1 hour before procedure as needed. 04/20/22   Georgina Quint, MD  omeprazole (PRILOSEC) 40 MG capsule TAKE 1  CAPSULE BY MOUTH EVERY DAY Patient taking differently: Take 40 mg by mouth daily. 03/30/23   Georgina Quint, MD  senna (SENOKOT) 8.6 MG tablet Take 1 tablet (8.6 mg total) by mouth as needed for constipation. 07/08/20   Merrilyn Puma, MD    Physical Exam: Vitals:   06/17/23 1423 06/17/23 1430 06/17/23 1445 06/17/23 1500  BP: (!) 162/82 (!) 146/98 (!) 149/122   Pulse: 83 77 70 66  Resp: 18 (!) 25 (!) 23 (!) 22  Temp: 98.1 F (36.7 C)     TempSrc: Oral     SpO2: 99% 97% 96% 98%    Constitutional: NAD, calm, comfortable Vitals:   06/17/23 1423 06/17/23 1430 06/17/23 1445 06/17/23 1500  BP: (!) 162/82 (!) 146/98 Marland Kitchen)  149/122   Pulse: 83 77 70 66  Resp: 18 (!) 25 (!) 23 (!) 22  Temp: 98.1 F (36.7 C)     TempSrc: Oral     SpO2: 99% 97% 96% 98%   Eyes: PERRL, lids and conjunctivae normal ENMT: Mucous membranes are dry. Posterior pharynx clear of any exudate or lesions.Normal dentition.  Neck: normal, supple, no masses, no thyromegaly Respiratory: clear to auscultation bilaterally, no wheezing, no crackles. Normal respiratory effort. No accessory muscle use.  Cardiovascular: Regular rate and rhythm, no murmurs / rubs / gallops. No extremity edema. 2+ pedal pulses. No carotid bruits.  Abdomen: no tenderness, no masses palpated. No hepatosplenomegaly. Bowel sounds positive.  Musculoskeletal: no clubbing / cyanosis. No joint deformity upper and lower extremities. Good ROM, no contractures. Normal muscle tone.  Skin: no rashes, lesions, ulcers. No induration Neurologic: CN 2-12 grossly intact. Sensation intact, DTR normal. Strength 5/5 in all 4.  Psychiatric: Normal judgment and insight. Alert and oriented x 3. Normal mood.     Labs on Admission: I have personally reviewed following labs and imaging studies  CBC: Recent Labs  Lab 06/17/23 1423 06/17/23 1451 06/17/23 1538  WBC 8.1  --   --   NEUTROABS 4.8  --   --   HGB 9.9* 9.9* 10.2*  HCT 30.3* 29.0* 30.0*  MCV 82.6  --    --   PLT 215  --   --    Basic Metabolic Panel: Recent Labs  Lab 06/17/23 1423 06/17/23 1451 06/17/23 1538  NA 126* 126* 126*  K 4.5 4.5 4.5  CL 93* 97*  --   CO2 13*  --   --   GLUCOSE 917* >700*  --   BUN 54* 48*  --   CREATININE 6.65* 7.10*  --   CALCIUM 7.7*  --   --    GFR: CrCl cannot be calculated (Unknown ideal weight.). Liver Function Tests: No results for input(s): "AST", "ALT", "ALKPHOS", "BILITOT", "PROT", "ALBUMIN" in the last 168 hours. No results for input(s): "LIPASE", "AMYLASE" in the last 168 hours. No results for input(s): "AMMONIA" in the last 168 hours. Coagulation Profile: No results for input(s): "INR", "PROTIME" in the last 168 hours. Cardiac Enzymes: No results for input(s): "CKTOTAL", "CKMB", "CKMBINDEX", "TROPONINI" in the last 168 hours. BNP (last 3 results) No results for input(s): "PROBNP" in the last 8760 hours. HbA1C: No results for input(s): "HGBA1C" in the last 72 hours. CBG: Recent Labs  Lab 06/17/23 1410 06/17/23 1535 06/17/23 1626 06/17/23 1706  GLUCAP >600* >600* >600* 579*   Lipid Profile: No results for input(s): "CHOL", "HDL", "LDLCALC", "TRIG", "CHOLHDL", "LDLDIRECT" in the last 72 hours. Thyroid Function Tests: No results for input(s): "TSH", "T4TOTAL", "FREET4", "T3FREE", "THYROIDAB" in the last 72 hours. Anemia Panel: No results for input(s): "VITAMINB12", "FOLATE", "FERRITIN", "TIBC", "IRON", "RETICCTPCT" in the last 72 hours. Urine analysis:    Component Value Date/Time   COLORURINE YELLOW 05/19/2023 1850   APPEARANCEUR HAZY (A) 05/19/2023 1850   APPEARANCEUR Clear 08/03/2017 1416   LABSPEC 1.014 05/19/2023 1850   PHURINE 5.0 05/19/2023 1850   GLUCOSEU >=500 (A) 05/19/2023 1850   HGBUR NEGATIVE 05/19/2023 1850   BILIRUBINUR NEGATIVE 05/19/2023 1850   BILIRUBINUR Negative 08/03/2017 1416   KETONESUR NEGATIVE 05/19/2023 1850   PROTEINUR >=300 (A) 05/19/2023 1850   NITRITE NEGATIVE 05/19/2023 1850    LEUKOCYTESUR SMALL (A) 05/19/2023 1850    Radiological Exams on Admission: CT Head Wo Contrast  Result Date: 06/17/2023 CLINICAL DATA:  Seizures EXAM: CT HEAD WITHOUT CONTRAST TECHNIQUE: Contiguous axial images were obtained from the base of the skull through the vertex without intravenous contrast. RADIATION DOSE REDUCTION: This exam was performed according to the departmental dose-optimization program which includes automated exposure control, adjustment of the mA and/or kV according to patient size and/or use of iterative reconstruction technique. COMPARISON:  04/13/2023 FINDINGS: Brain: No acute intracranial findings are seen. There are no signs of bleeding within the cranium. Cortical sulci are prominent. Vascular: Unremarkable. Skull: No acute findings are seen. Sinuses/Orbits: There is mucous retention cyst in right maxillary sinus. Other: None. IMPRESSION: No acute intracranial findings are seen.  Atrophy. Chronic sinusitis. Electronically Signed   By: Ernie Avena M.D.   On: 06/17/2023 15:59   DG Chest Portable 1 View  Result Date: 06/17/2023 CLINICAL DATA:  Hyperglycemia. EXAM: PORTABLE CHEST 1 VIEW COMPARISON:  05/19/2023. FINDINGS: Low lung volumes accentuate the pulmonary vasculature and cardiomediastinal silhouette. No consolidation or pulmonary edema. No pleural effusion or pneumothorax. IMPRESSION: Low lung volumes without evidence of acute cardiopulmonary disease. Electronically Signed   By: Orvan Falconer M.D.   On: 06/17/2023 15:10    EKG: Independently reviewed.  Sinus, no acute ST changes, chronic RBBB  Assessment/Plan Principal Problem:   Hyperosmolar hyperglycemic state (HHS) (HCC) Active Problems:   Stage 4 chronic kidney disease (HCC)   Seizure (HCC)  (please populate well all problems here in Problem List. (For example, if patient is on BP meds at home and you resume or decide to hold them, it is a problem that needs to be her. Same for CAD, COPD, HLD and so  on)  HHS DKA IDDM with hyperglycemia, poorly controlled diabetes -Status post IV bolus 3 L, agree with insulin drip -BMP every 4 hours to adjust IV fluid, next reading 1900 this evening -Appears the patient does not have meal coverage short acting insulin as part of her DM regimen.  Clinically also suspect patient has insulin resistance, once patient out of DKA, will consider Januvia  Acute uncompensated combined high anion gap and none anion gap metabolic acidosis and acute respiratory alkalosis -Manage DKA first as above -Recheck VBG in AM.  Partial seizure versus pseudoseizure -CT head reassuring, neurology consulted in the ED -Defer further seizure evaluation and antiseizure medication decision to neurology -Other Ddx, patient does have hypocalcemia, will give 1 dose of 1 g calcium gluconate IV and recheck calcium level tomorrow.  Unclear whether HHS plus DKA can trigger seizure-like movement.  Hypocalcemia -IV calcium, recheck level tomorrow  CKD stage IV -Volume depleted, hold off diuresis -Continue IV fluid -Severe acidosis likely secondary to DKA rather than worsening of her kidney function.  Will treat DKA then recheck VBG tomorrow morning -Patient reported still making good amount of urine, thus at this point there is no indication for emergency HD.  HFpEF, chronic -Volume depleted, hold off diuresis -Blood pressure controlled, continue Cardizem   DVT prophylaxis: Heparin subQ Code Status: Full code Family Communication: Husband at bedside Disposition Plan: Patient is sick with seizure activity and HHS/DKA requiring IV insulin and inpatient neurology consultation, expect more than 2 midnight hospital stay Consults called: Neurology and ICU Admission status: PCU admit   Emeline General MD [facility] Pager [phone]  06/17/2023, 5:41 PM

## 2023-06-17 NOTE — Consult Note (Signed)
Neurology Consultation  Reason for Consult: Seizure activity Referring Physician: Dr. Eloise Harman  CC: Seizures  History is obtained from: Patient, family and chart  HPI: Nancy [name] is a 67 y.o. female with history of CKD stage V, CHF and diabetes who presented today with seizure activity and severe hyperglycemia.  Patient states that for the last month, she has had intermittent twitching and jerking movements of the left arm which sometimes spread to the left leg and also sometimes spread to the mouth and face.  These episodes are typically short, and patient retains awareness during them.  He states that she sometimes feels a little foggy afterwards.  While en route to the ED today, she had generalized tonic-clonic activity and was given 2.5 mg of Versed.  Patient has no personal or family history of seizures, has never had a significant head injury, has never had meningitis or encephalitis and had no problems with her birth or development as a child.  She was also noted to have an unmeasurably high blood sugar upon arrival to the ED and is being treated with IV insulin.  While patient was being interviewed, spontaneous twitching and jerking motions of the left forearm were noted, which patient was not able to suppress.  These motions terminated spontaneously after about 30 seconds.   ROS: A complete ROS was performed and is negative except as noted in the HPI.   Past Medical History:  Diagnosis Date   Carpal tunnel syndrome, bilateral    confirmed on nerve conduction studies   Chronic hepatitis C (HCC)    considered cured 06/2016 post retroviral therapy    Diabetes mellitus type 2, uncontrolled    Diabetic peripheral neuropathy (HCC)    GERD (gastroesophageal reflux disease)    History of endometriosis    History of Helicobacter infection 11/07   Hyperlipidemia    Liver fibrosis 02/17/2016   F2/3      Family History  Problem Relation Age of Onset   Cancer Mother        lung    Kidney disease Mother    Lung cancer Maternal Uncle    Breast cancer Maternal Grandmother    Liver cancer Maternal Grandfather    Heart disease Brother    Depression Brother    Diabetes Father    Dementia Father    Colon cancer Neg Hx      Social History:   reports that she has never smoked. She has never used smokeless tobacco. She reports that she does not drink alcohol and does not use drugs.  Medications  Current Facility-Administered Medications:    dextrose 5 % in lactated ringers infusion, , Intravenous, Continuous, Rondel Baton, MD   dextrose 50 % solution 0-50 mL, 0-50 mL, Intravenous, PRN, Rondel Baton, MD   fosPHENYtoin (CEREBYX) 20 mg PE/kg in sodium chloride 0.9 % 50 mL IVPB, 20 mg PE/kg, Intravenous, Once, de La Torre, Cortney E, NP   insulin regular, human (MYXREDLIN) 100 units/ 100 mL infusion, , Intravenous, Continuous, Rondel Baton, MD, Last Rate: 9 mL/hr at 06/17/23 1628, 9 Units/hr at 06/17/23 1628   lactated ringers bolus 1,000 mL, 1,000 mL, Intravenous, Once, Cheri Fowler, MD   lactated ringers infusion, , Intravenous, Continuous, Rondel Baton, MD   phenytoin (DILANTIN) chewable tablet 100 mg, 100 mg, Oral, TID, de Saintclair Halsted, Cortney E, NP   potassium chloride 10 mEq in 100 mL IVPB, 10 mEq, Intravenous, Q1H, Rondel Baton, MD, Last Rate: 100 mL/hr at  06/17/23 1714, 10 mEq at 06/17/23 1714  Current Outpatient Medications:    ACCU-CHEK FASTCLIX LANCETS MISC, Use to check blood sugar up to 3 times a day, Disp: 306 each, Rfl: 3   ACCU-CHEK GUIDE test strip, CHECK BLOOD SUGAR 3 TIMES A DAY, Disp: 300 strip, Rfl: 3   atorvastatin (LIPITOR) 40 MG tablet, TAKE 1 TABLET BY MOUTH EVERY DAY (Patient taking differently: Take 40 mg by mouth daily.), Disp: 90 tablet, Rfl: 3   Blood Glucose Monitoring Suppl (ACCU-CHEK GUIDE) w/Device KIT, 1 each by Does not apply route 3 (three) times daily. Check blood sugar 3 times a day, Disp: 1 kit, Rfl: 0    Cholecalciferol (VITAMIN D3) 25 MCG (1000 UT) CAPS, Take 1 capsule by mouth daily., Disp: , Rfl:    cyclobenzaprine (FLEXERIL) 10 MG tablet, TAKE 1 TABLET BY MOUTH EVERYDAY AT BEDTIME, Disp: 30 tablet, Rfl: 1   dapagliflozin propanediol (FARXIGA) 10 MG TABS tablet, TAKE 1 TABLET BY MOUTH EVERY DAY BEFORE BREAKFAST (Patient taking differently: Take 10 mg by mouth daily.), Disp: 90 tablet, Rfl: 3   diclofenac Sodium (VOLTAREN) 1 % GEL, Apply 2 g topically 4 (four) times daily., Disp: 150 g, Rfl: 0   DILT-XR 240 MG 24 hr capsule, Take 1 capsule (240 mg total) by mouth daily., Disp: 90 capsule, Rfl: 2   Dulaglutide (TRULICITY) 1.5 MG/0.5ML SOPN, Inject 1.5 mg into the skin once a week., Disp: 6 mL, Rfl: 3   furosemide (LASIX) 40 MG tablet, TAKE 1 TABLET BY MOUTH THREE TIMES PER WEEK, Disp: 60 tablet, Rfl: 2   insulin glargine (LANTUS) 100 UNIT/ML injection, INJECT 25 UNITS INTO THE SKIN EVERY MORNING WHEN YOU WAKE UP (Patient taking differently: Inject 25 Units into the skin daily.), Disp: 30 mL, Rfl: 3   Insulin Syringe-Needle U-100 31G X 15/64" 0.5 ML MISC, Use to inject insulin one time a day, Disp: 100 each, Rfl: 11   Insulin Syringes, Disposable, U-100 1 ML MISC, 2 Syringes by Does not apply route daily. Use to inject insulin into the skin 1 time daily. diag code E11.9. Insulin dependent, Disp: 100 each, Rfl: 3   LORazepam (ATIVAN) 1 MG tablet, Sig 1 mg 1 hour before procedure as needed., Disp: 10 tablet, Rfl: 0   omeprazole (PRILOSEC) 40 MG capsule, TAKE 1 CAPSULE BY MOUTH EVERY DAY (Patient taking differently: Take 40 mg by mouth daily.), Disp: 90 capsule, Rfl: 3   senna (SENOKOT) 8.6 MG tablet, Take 1 tablet (8.6 mg total) by mouth as needed for constipation., Disp: 60 tablet, Rfl: 3   Exam: Current vital signs: BP (!) 149/122   Pulse 66   Temp 98.1 F (36.7 C) (Oral)   Resp (!) 22   LMP 02/15/2009   SpO2 98%  Vital signs in last 24 hours: Temp:  [98.1 F (36.7 C)] 98.1 F (36.7 C)  (07/11 1423) Pulse Rate:  [66-83] 66 (07/11 1500) Resp:  [18-25] 22 (07/11 1500) BP: (146-162)/(82-122) 149/122 (07/11 1445) SpO2:  [96 %-100 %] 98 % (07/11 1500)  GENERAL: Awake, alert, mildly anxious Psych: Affect appropriate for situation, patient is anxious but cooperative with examination Head: Normocephalic and atraumatic, without obvious abnormality EENT: Normal conjunctivae, moist mucous membranes, no OP obstruction LUNGS: Normal respiratory effort. Non-labored breathing on room air CV: Regular rate and rhythm on telemetry Extremities: warm, well perfused, without obvious deformity  NEURO:  Mental Status: Awake, alert, and oriented to person, place, time, and situation. She is able to provide a clear  and coherent history of present illness. Speech/Language: speech is clear and fluent.   No neglect is noted Cranial Nerves:  II: PERRL III, IV, VI: EOMI. Lid elevation symmetric and full.  V: Sensation is intact to light touch and symmetrical to face.  VII: Face is symmetric resting and smiling.  VIII: Hearing intact to voice IX, X: Phonation normal.  XI: Normal sternocleidomastoid and trapezius muscle strength XII: Tongue protrudes midline without fasciculations.   Motor: 5/5 strength is all muscle groups.  Tone is normal. Bulk is normal.  Focal motor seizure activity briefly during exam as described in HPI. Sensation: Intact to light touch bilaterally in all four extremities.  Coordination: FTN intact bilaterally. No pronator drift.  Gait: Deferred  Labs I have reviewed labs in epic and the results pertinent to this consultation are:   CBC    Component Value Date/Time   WBC 8.1 06/17/2023 1423   RBC 3.67 (L) 06/17/2023 1423   HGB 10.2 (L) 06/17/2023 1538   HGB 11.9 07/12/2018 1502   HCT 30.0 (L) 06/17/2023 1538   HCT 35.5 07/12/2018 1502   PLT 215 06/17/2023 1423   PLT 344 07/12/2018 1502   MCV 82.6 06/17/2023 1423   MCV 80 07/12/2018 1502   MCH 27.0  06/17/2023 1423   MCHC 32.7 06/17/2023 1423   RDW 13.0 06/17/2023 1423   RDW 14.9 07/12/2018 1502   LYMPHSABS 2.5 06/17/2023 1423   MONOABS 0.6 06/17/2023 1423   EOSABS 0.1 06/17/2023 1423   BASOSABS 0.1 06/17/2023 1423    CMP     Component Value Date/Time   NA 126 (L) 06/17/2023 1538   NA 141 01/06/2022 1136   K 4.5 06/17/2023 1538   CL 97 (L) 06/17/2023 1451   CO2 13 (L) 06/17/2023 1423   GLUCOSE >700 (HH) 06/17/2023 1451   BUN 48 (H) 06/17/2023 1451   BUN 29 (H) 01/06/2022 1136   CREATININE 7.10 (H) 06/17/2023 1451   CREATININE 1.31 (H) 07/18/2019 1246   CREATININE 0.77 03/06/2015 1130   CALCIUM 7.7 (L) 06/17/2023 1423   PROT 8.0 05/19/2023 1352   PROT 6.1 08/15/2015 1458   ALBUMIN 3.8 05/19/2023 1352   ALBUMIN 3.9 06/10/2018 1051   AST 9 05/19/2023 1352   AST 18 07/18/2019 1246   ALT 7 05/19/2023 1352   ALT 16 07/18/2019 1246   ALKPHOS 91 05/19/2023 1352   BILITOT 0.4 05/19/2023 1352   BILITOT 0.4 07/18/2019 1246   GFRNONAA 6 (L) 06/17/2023 1423   GFRNONAA 44 (L) 07/18/2019 1246   GFRNONAA 86 03/06/2015 1130   GFRAA 33 (L) 11/07/2020 1142   GFRAA 51 (L) 07/18/2019 1246   GFRAA >89 03/06/2015 1130    Lipid Panel     Component Value Date/Time   CHOL 161 05/14/2022 1121   CHOL 140 09/12/2019 1035   TRIG 71.0 05/14/2022 1121   HDL 57.00 05/14/2022 1121   HDL 51 09/12/2019 1035   CHOLHDL 3 05/14/2022 1121   VLDL 14.2 05/14/2022 1121   LDLCALC 90 05/14/2022 1121   LDLCALC 67 09/12/2019 1035     Imaging I have reviewed the images obtained:  CT-scan of the brain: No acute abnormality, atrophy  MRI examination of the brain pending  EEG pending  Assessment: 67 year old patient with history of CKD stage V, CHF and diabetes presented to the ED today with seizure activity in conjunction with severe hyperglycemia.  Patient states that for about the past month, she has had intermittent twitching and jerking movements of  the left arm which sometimes spread to  the leg and face.  While en route, she had witnessed tonic-clonic activity and was administered Versed.  During exam in the ED she began to have focal twitching and jerking motions of the left forearm which she was unable to suppress and which self terminated in about 30 seconds. RN stated she had had other similar episodes.   - Keppra is a suboptimal option for patient given renal failure, and Depakote is also not an ideal option given recent elevated ammonia.   - Will load with fosphenytoin and start on TID Dilantin to cover for breakthrough seizures while treating her hyperglycemia   - Will connect to long-term EEG as well.   - Patient also presented with severely elevated blood glucose and HHS, which is being managed by primary team.  Impression: Focal seizure activity in a patient with multiple medical comorbidities and severe hyperglycemia  Recommendations: -Load with fosphenytoin 20 mg PE per kilogram -Initiate scheduled phenytoin 100 mg 3 times daily. After she serum glucose has been stabilized for 24 hours, stop Dilantin and monitor for possible seizure recurrence. If seizures recur while normoglycemic, will need to restart Dilantin and discharge home on this medication with outpatient seizure precautions (including no driving until seizure free for 6 months) and outpatient Neurology follow up.  -Overnight EEG with video -Maintain seizure precautions -2 mg Ativan IV for any seizure activity that does not self terminate within 2 minutes -Treatment of HHS and other comorbidities per primary team  Pt seen by NP/Neuro and later by MD. Note/plan to be edited by MD as needed.  Cortney E Ernestina Columbia , MSN, AGACNP-BC [facility] See Amion for schedule and pager information 06/17/2023 5:30 PM  I have seen and examined the patient. I have formulated the assessment and recommendations. 68 year old patient with history of CKD stage V, CHF and diabetes presented to the ED today with  seizure activity in conjunction with severe hyperglycemia. Exam with brief focal motor seizure activity, but otherwise nonfocal. Recommendations as above.  Electronically signed: Dr. Caryl Pina

## 2023-06-17 NOTE — ED Triage Notes (Signed)
EMS called for high CGB >600, 2 seizures in route for EMS, given 2.5 of Versed by RN upon arrival, after seizing for about 3 minutes

## 2023-06-18 DIAGNOSIS — E1169 Type 2 diabetes mellitus with other specified complication: Secondary | ICD-10-CM

## 2023-06-18 DIAGNOSIS — R569 Unspecified convulsions: Secondary | ICD-10-CM | POA: Diagnosis not present

## 2023-06-18 DIAGNOSIS — I152 Hypertension secondary to endocrine disorders: Secondary | ICD-10-CM

## 2023-06-18 DIAGNOSIS — N184 Chronic kidney disease, stage 4 (severe): Secondary | ICD-10-CM

## 2023-06-18 DIAGNOSIS — I5032 Chronic diastolic (congestive) heart failure: Secondary | ICD-10-CM

## 2023-06-18 DIAGNOSIS — E1100 Type 2 diabetes mellitus with hyperosmolarity without nonketotic hyperglycemic-hyperosmolar coma (NKHHC): Secondary | ICD-10-CM | POA: Diagnosis not present

## 2023-06-18 DIAGNOSIS — E1159 Type 2 diabetes mellitus with other circulatory complications: Secondary | ICD-10-CM

## 2023-06-18 DIAGNOSIS — E669 Obesity, unspecified: Secondary | ICD-10-CM

## 2023-06-18 DIAGNOSIS — E66811 Obesity, class 1: Secondary | ICD-10-CM

## 2023-06-18 DIAGNOSIS — E785 Hyperlipidemia, unspecified: Secondary | ICD-10-CM

## 2023-06-18 LAB — PHENYTOIN LEVEL, TOTAL: Phenytoin Lvl: 9.9 ug/mL — ABNORMAL LOW (ref 10.0–20.0)

## 2023-06-18 LAB — BASIC METABOLIC PANEL
Anion gap: 14 (ref 5–15)
Anion gap: 10 (ref 5–15)
BUN: 43 mg/dL — ABNORMAL HIGH (ref 8–23)
BUN: 41 mg/dL — ABNORMAL HIGH (ref 8–23)
CO2: 20 mmol/L — ABNORMAL LOW (ref 22–32)
CO2: 22 mmol/L (ref 22–32)
Calcium: 8.5 mg/dL — ABNORMAL LOW (ref 8.9–10.3)
Calcium: 8.4 mg/dL — ABNORMAL LOW (ref 8.9–10.3)
Chloride: 103 mmol/L (ref 98–111)
Chloride: 105 mmol/L (ref 98–111)
Creatinine, Ser: 5.74 mg/dL — ABNORMAL HIGH (ref 0.44–1.00)
Creatinine, Ser: 5.73 mg/dL — ABNORMAL HIGH (ref 0.44–1.00)
GFR, Estimated: 8 mL/min — ABNORMAL LOW (ref >60)
GFR, Estimated: 8 mL/min — ABNORMAL LOW (ref >60)
Glucose, Bld: 167 mg/dL — ABNORMAL HIGH (ref 70–99)
Glucose, Bld: 190 mg/dL — ABNORMAL HIGH (ref 70–99)
Potassium: 3.4 mmol/L — ABNORMAL LOW (ref 3.5–5.1)
Potassium: 3.6 mmol/L (ref 3.5–5.1)
Sodium: 137 mmol/L (ref 135–145)
Sodium: 137 mmol/L (ref 135–145)

## 2023-06-18 LAB — BLOOD GAS, VENOUS
Acid-base deficit: 2.3 mmol/L — ABNORMAL HIGH (ref 0.0–2.0)
Bicarbonate: 22.5 mmol/L (ref 20.0–28.0)
Drawn by: 66579
O2 Saturation: 88.4 %
Patient temperature: 36.6
pCO2, Ven: 37 mmHg — ABNORMAL LOW (ref 44–60)
pH, Ven: 7.39 (ref 7.25–7.43)
pO2, Ven: 47 mmHg — ABNORMAL HIGH (ref 32–45)

## 2023-06-18 LAB — GLUCOSE, CAPILLARY
Glucose-Capillary: 101 mg/dL — ABNORMAL HIGH (ref 70–99)
Glucose-Capillary: 144 mg/dL — ABNORMAL HIGH (ref 70–99)
Glucose-Capillary: 144 mg/dL — ABNORMAL HIGH (ref 70–99)
Glucose-Capillary: 145 mg/dL — ABNORMAL HIGH (ref 70–99)
Glucose-Capillary: 148 mg/dL — ABNORMAL HIGH (ref 70–99)
Glucose-Capillary: 158 mg/dL — ABNORMAL HIGH (ref 70–99)
Glucose-Capillary: 165 mg/dL — ABNORMAL HIGH (ref 70–99)
Glucose-Capillary: 167 mg/dL — ABNORMAL HIGH (ref 70–99)
Glucose-Capillary: 171 mg/dL — ABNORMAL HIGH (ref 70–99)
Glucose-Capillary: 183 mg/dL — ABNORMAL HIGH (ref 70–99)
Glucose-Capillary: 183 mg/dL — ABNORMAL HIGH (ref 70–99)
Glucose-Capillary: 197 mg/dL — ABNORMAL HIGH (ref 70–99)
Glucose-Capillary: 220 mg/dL — ABNORMAL HIGH (ref 70–99)
Glucose-Capillary: 237 mg/dL — ABNORMAL HIGH (ref 70–99)
Glucose-Capillary: 323 mg/dL — ABNORMAL HIGH (ref 70–99)

## 2023-06-18 LAB — CBC
HCT: 29.8 % — ABNORMAL LOW (ref 36.0–46.0)
Hemoglobin: 10.3 g/dL — ABNORMAL LOW (ref 12.0–15.0)
MCH: 26.9 pg (ref 26.0–34.0)
MCHC: 34.6 g/dL (ref 30.0–36.0)
MCV: 77.8 fL — ABNORMAL LOW (ref 80.0–100.0)
Platelets: 239 10*3/uL (ref 150–400)
RBC: 3.83 MIL/uL — ABNORMAL LOW (ref 3.87–5.11)
RDW: 13 % (ref 11.5–15.5)
WBC: 9.3 10*3/uL (ref 4.0–10.5)
nRBC: 0 % (ref 0.0–0.2)

## 2023-06-18 LAB — BETA-HYDROXYBUTYRIC ACID: Beta-Hydroxybutyric Acid: 0.1 mmol/L (ref 0.05–0.27)

## 2023-06-18 MED ORDER — INSULIN ASPART 100 UNIT/ML IJ SOLN
0.0000 [IU] | Freq: Three times a day (TID) | INTRAMUSCULAR | Status: DC
  Administered 2023-06-18: 1 [IU] via SUBCUTANEOUS
  Administered 2023-06-18: 3 [IU] via SUBCUTANEOUS
  Administered 2023-06-19: 2 [IU] via SUBCUTANEOUS
  Administered 2023-06-19 (×2): 3 [IU] via SUBCUTANEOUS

## 2023-06-18 MED ORDER — INSULIN GLARGINE-YFGN 100 UNIT/ML ~~LOC~~ SOLN
10.0000 [IU] | Freq: Every day | SUBCUTANEOUS | Status: DC
  Administered 2023-06-18 – 2023-06-19 (×2): 10 [IU] via SUBCUTANEOUS
  Filled 2023-06-18 (×2): qty 0.1

## 2023-06-18 NOTE — Hospital Course (Addendum)
Nancy [name] was admitted to the hospital with the working diagnosis of hyperosmolar non ketotic state.   67 yo female with the past medical history of T2DM, hypertension, CKD, heart failure, hepatitis C, seizures and GERD who presented with altered mental status.  Recent hospitalization 05/07 to 04/22/23 for acute metabolic encephalopathy, noted to have hypoglycemic events, she was discharged on Trulicity and SGLT 2 inh.  At home she has not been compliant with Trulicity.  On the day of admission, she missed her diabetic medications. Later during the day she developed shaking movements on her left side, forearm and hand, then lost her consciousness. Episode lasted 15 minutes. EMS was called and she was transported to the ED. On route to the ED she had another episode that was treated with midazolam. On her initial ED evaluation her blood pressure was 162/82, HR 83, RR 18 and 02 saturation 96%, she was awake and alert, non focal, lungs with no wheezing or rales, heart with S1 and S2 present and rhythmic, abdomen with no distention and no lower extremity edema.   Na 126, K 4,5 CL 93, bicarbonate 13, glucose 917, bun 54 cr 6,65.  Serum osmolality 333 Wbc 8,1 hgb 9,9 plt 215  Urine analysis SG 1,014, protein 100, glucose >500, negative ketones, negative hgb, negative leukocytes.  Toxicology positive for benzodiazepines.   Head CT with no acute changes.  Chest radiograph with hypoinflation with no infiltrates or effusions.  EKG 85 bpm, normal axis, normal intervals, sinus rhythm with no significant ST segment or T wave changes.   Patient was placed on insulin drip for glucose control Neurology was consulted and she was placed on continuous EEG monitoring.   07/13 patient was transitioned to sq insulin with good toleration. She did have phenytoin during her hospitalization until 07/12. Her glucose improved and she had no further seizures. Plan to continue insulin therapy at home and have close follow  up as outpatient.

## 2023-06-18 NOTE — Procedures (Signed)
Patient Name: Nancy [name]  MRN: [id number]  Epilepsy Attending: Charlsie Quest  Referring Physician/Provider: Caryl Pina, MD  Duration: 06/17/2023 1943 to 06/18/2023 1647  Patient history: 67 year old patient with history of CKD stage V, CHF and diabetes presented to the ED today with seizure activity in conjunction with severe hyperglycemia.  Patient states that for about the past month, she has had intermittent twitching and jerking movements of the left arm which sometimes spread to the leg and face.  While en route, she had witnessed tonic-clonic activity and was administered Versed.  During exam in the ED she began to have focal twitching and jerking motions of the left forearm which she was unable to suppress and which self terminated in about 30 seconds. RN stated she had had other similar episodes. EEG to evaluate for seizure.  Level of alertness: Awake, asleep  AEDs during EEG study: PHT  Technical aspects: This EEG study was done with scalp electrodes positioned according to the 10-20 International system of electrode placement. Electrical activity was reviewed with band pass filter of 1-70Hz , sensitivity of 7 uV/mm, display speed of 64mm/sec with a 60Hz  notched filter applied as appropriate. EEG data were recorded continuously and digitally stored.  Video monitoring was available and reviewed as appropriate.  Description: The posterior dominant rhythm consists of 7 Hz activity of moderate voltage (25-35 uV) seen predominantly in posterior head regions, symmetric and reactive to eye opening and eye closing. Sleep was characterized by vertex waves, sleep spindles (12 to 14 Hz), maximal frontocentral region. EEG showed continuous generalized 5 to 7 Hz theta slowing. Hyperventilation and photic stimulation were not performed.     ABNORMALITY - Continuous slow, generalized  IMPRESSION: This study is suggestive of moderate diffuse encephalopathy, nonspecific etiology. No seizures or  epileptiform discharges were seen throughout the recording.  Please note lack of epileptiform activity during interictal EEG does not exclude the diagnosis of epilepsy.  Nancy [name]

## 2023-06-18 NOTE — Assessment & Plan Note (Signed)
AKI, pseudohyponatremia.  Renal function with serum cr at 5,73 with K at 3,6 and serum bicarbonate at 22. Anion gap is 10 and Na 137.  Continue close monitoring of renal function and electrolytes. Hold diuretic therapy for now.  Hold IV fluids.

## 2023-06-18 NOTE — Progress Notes (Signed)
LTM maint complete - no skin breakdown under: FP1,F7   

## 2023-06-18 NOTE — Progress Notes (Signed)
Progress Note   Patient: Nancy [name] WUJ:[id number] DOB: 1955/12/28 DOA: 06/17/2023     1 DOS: the patient was seen and examined on 06/18/2023   Brief hospital course: Nancy [name] was admitted to the hospital with the working diagnosis of hyperosmolar non ketotic state.   67 yo female with the past medical history of T2DM, hypertension, CKD, heart failure, hepatitis C, seizures and GERD who presented with altered mental status.  Recent hospitalization 05/07 to 04/22/23 for acute metabolic encephalopathy, noted to have hypoglycemic events, she was discharged on Trulicity and SGLT 2 inh.  At home she has been using insulin.  On the day of admission, she missed her long acting insulin. She developed shaking movements on her left side, forearm and hand, then losing her consciousness. Episode lasted 15 minutes. EMS was called and she was transported to the ED. On route to the ED she had another episode that was treated with midazolam. On her initial ED evaluation her blood pressure was 162/82, HR 83, RR 18 and 02 saturation 96%, she was awake and alert, non focal, lungs with no wheezing or rales, heart with S1 and S2 present and rhythmic, abdomen with no distention and no lower extremity edema.   Na 126, K 4,5 CL 93, bicarbonate 13, glucose 917, bun 54 cr 6,65.  Serum osmolality 333 Wbc 8,1 hgb 9,9 plt 215  Urine analysis SG 1,014, protein 100, glucose >500, negative ketones, negative hgb, negative leukocytes.  Toxicology positive for benzodiazepines.   Head CT with no acute changes.  Chest radiograph with hypoinflation with no infiltrates or effusions.  EKG 85 bpm, normal axis, normal intervals, sinus rhythm with no significant ST segment or T wave changes.   Patient was placed on insulin drip for glucose control Neurology was consulted and she was placed on continuous EEG monitoring.   Assessment and Plan: * Hyperosmolar hyperglycemic state (HHS) (HCC) Patient has been placed on insulin  drip with good response.   Follow up glucose is 190 mg/dl with no anion gap.  Patient with no nausea or vomiting, no further acute neurologic deficits.   Plan to resume basal insulin 10 units and add insulin sliding scale for further glucose cover and monitoring.  Advance diet.   Seizure Nancy [name]) Patient with no further seizures.  Continue EEG monitoring and glucose control. Continue with phenytoin and neurology recommendations.   Stage 4 chronic kidney disease (HCC) AKI, pseudohyponatremia.  Renal function with serum cr at 5,73 with K at 3,6 and serum bicarbonate at 22. Anion gap is 10 and Na 137.  Continue close monitoring of renal function and electrolytes. Hold diuretic therapy for now.  Hold IV fluids.   Hypertension associated with diabetes (HCC) Continue blood pressure control with diltiazem.   Type 2 diabetes mellitus with hyperlipidemia (HCC) (Not type 1 DM, confirmed with patient).  Continue basal and sliding scale insulin, will further calculate requirements.  Considering her low GFR she is at risk for hypoglycemia.  Will need close glucose monitoring.   Continue with statin therapy.   Chronic diastolic CHF (congestive heart failure) (HCC) No signs of acute heart failure decompensation.  Continue diltiazem and further blood pressure monitoring.  Hold on IV fluids.   Class 1 obesity Calculated BMI is 34,4        Subjective: patient is feeling better, no nausea or vomiting, no further seizures.  Physical Exam: Vitals:   06/17/23 2334 06/18/23 0331 06/18/23 0500 06/18/23 0850  BP:    (!) 161/83  Pulse:  87  Resp: 18 16  18   Temp: 98.5 F (36.9 C) 98.5 F (36.9 C)  98.3 F (36.8 C)  TempSrc: Oral Oral  Oral  SpO2:      Weight:   72.1 kg   Height:       Neurology awake and alert, non focal  ENT with mild pallor Cardiovascular with S1 and S2 present and rhythmic with no gallops,or rubs Respiratory with no rales or wheezing, no rhonchi Abdomen  with no distention  No lower extremity edema   Data Reviewed:    Family Communication: no family at the bedside   Disposition: Status is: Inpatient Remains inpatient appropriate because: glucose control and neuro monitoring   Planned Discharge Destination: Home    Author: Coralie Keens, MD 06/18/2023 11:27 AM  For on call review [email or web address].

## 2023-06-18 NOTE — Assessment & Plan Note (Addendum)
(  Not type 1 DM, confirmed with patient).  Continue basal and sliding scale insulin, will further calculate requirements.  Considering her low GFR she is at risk for hypoglycemia.  Will need close glucose monitoring.   Continue with statin therapy.

## 2023-06-18 NOTE — Plan of Care (Signed)
  Problem: Health Behavior/Discharge Planning: Goal: Ability to manage health-related needs will improve Outcome: Progressing   Problem: Clinical Measurements: Goal: Ability to maintain clinical measurements within normal limits will improve Outcome: Progressing   Problem: Clinical Measurements: Goal: Will remain free from infection Outcome: Progressing   Problem: Clinical Measurements: Goal: Diagnostic test results will improve Outcome: Progressing   

## 2023-06-18 NOTE — Assessment & Plan Note (Signed)
No signs of acute heart failure decompensation.  Continue diltiazem and further blood pressure monitoring.  Hold on IV fluids.

## 2023-06-18 NOTE — Assessment & Plan Note (Signed)
Patient with no further seizures.  EEG with no further seizures. She did received phenytoin while hospitalized, but discontinued when glucose better controlled.

## 2023-06-18 NOTE — Assessment & Plan Note (Signed)
Calculated BMI is 34,4

## 2023-06-18 NOTE — Significant Event (Signed)
On-call overnight neurologist note  I was approached by the bedside RN from 3 E. at 2:22 AM regarding clarification of orders for fosphenytoin.  Fosphenytoin was ordered in the evening by the covering neurologist but it was never administered-pharmacy cites some printer issue that caused delay in mixing the medication.  The medication was mixed at 1030 and the nurse was notified at 10:35 PM last night but never picked up the medicine until after 2 AM.   At this time, I would recommend continuing with the fosphenytoin load 1250 mg PE-the patient already has received a 100 mg dose of Dilantin once.  Patient is 71 KGS and weight and loading dose of up to 1400 mg should be fine.  The maintenance dose of 100 mg 3 times daily Dilantin should be continued  A safety zone has been filed--would appreciate pharmacy and the units looking into this in detail to prevent this in the future.  -- Milon Dikes, MD Neurologist [facility] Pager: [phone]

## 2023-06-18 NOTE — Progress Notes (Signed)
Neurology Progress Note  Brief HPI: Nancy [name] is a 67 y.o. female with history of CKD stage V, CHF and diabetes who presented yesterday with seizure activity and severe hyperglycemia.  Patient states that for the last month, she has had intermittent twitching and jerking movements of the left arm which sometimes spread to the left leg and also sometimes spread to the mouth and face.  These episodes are typically short, and patient retains awareness during them.  He states that she sometimes feels a little foggy afterwards.  While en route to the ED yesterday, she had generalized tonic-clonic activity and was given 2.5 mg of Versed.  Patient has no personal or family history of seizures, has never had a significant head injury, has never had meningitis or encephalitis and had no problems with her birth or development as a child.  She was also noted to have an unmeasurably high blood sugar upon arrival to the ED and IV insulin was started.  While patient was being interviewed on 7/11, spontaneous twitching and jerking motions of the left forearm were noted, which patient was not able to suppress.  These motions terminated spontaneously after about 30 seconds.   Subjective: Patient seen in room today with LTM still connected. She states that she is feeling better today. She did not notice any twitching or spells overnight. She was able to sleep last night.   Exam: Vitals:   06/17/23 2334 06/18/23 0331  BP:    Pulse:    Resp: 18 16  Temp: 98.5 F (36.9 C) 98.5 F (36.9 C)  SpO2:     Gen: In bed, NAD Resp: non-labored breathing, no acute distress Abd: soft, nt  NEURO:  Mental Status: Awake, alert, and oriented to person, place, time, and situation. She is able to provide a clear and coherent history of present illness. Speech/Language: She does have some stuttering speech and word finding difficulties that are baseline. Naming and repetition intact No neglect is noted Cranial Nerves:   II: PERRL III, IV, VI: EOMI. Lid elevation symmetric and full.  V: Sensation is intact to light touch and symmetrical to face.  VII: Face is symmetric resting and smiling.  VIII: Hearing intact to voice IX, X: Phonation normal.  XI: Normal sternocleidomastoid and trapezius muscle strength XII: Tongue protrudes midline without fasciculations.   Motor: 5/5 strength is all muscle groups.  Tone is normal. Bulk is normal.  Sensation: Intact to light touch bilaterally in all four extremities.  Coordination: FTN intact bilaterally. No pronator drift.  Gait: Deferred  Imaging I have reviewed the images obtained:   CT-scan of the brain: No acute abnormality, atrophy   MRI examination of the brain pending   LTM EEG 06/17/2023 1943 to 06/18/2023 0915  ABNORMALITY - Continuous slow, generalized IMPRESSION: This study is suggestive of moderate diffuse encephalopathy, nonspecific etiology. No seizures or epileptiform discharges were seen throughout the recording. Please note lack of epileptiform activity during interictal EEG does not exclude the diagnosis of epilepsy.   Assessment: 67 year old patient with history of CKD stage V, CHF and diabetes presented to the ED yesterday with seizure activity in conjunction with severe hyperglycemia and a diagnosis of HHS.  Patient states that for about the past month, she has had intermittent twitching and jerking movements of the left arm which sometimes spread to the leg and face.  While en route to the ED yesterday, she had witnessed tonic-clonic activity and was administered Versed.  During exam in the ED she began  to have focal twitching and jerking motions of the left forearm which she was unable to suppress and which self terminated in about 30 seconds. RN stated she had had other similar episodes.   - Keppra is a suboptimal option for patient given renal failure, and Depakote is also not an ideal option given recent elevated ammonia.   - Loaded on  Thursday with fosphenytoin and started on TID Dilantin to cover for breakthrough seizures while treating her hyperglycemia   - LTM EEG 06/17/2023 1943 to 06/18/2023 0915: Continuous slow, generalized. The study is suggestive of moderate diffuse encephalopathy, nonspecific to etiology. No seizures or epileptiform discharges were seen throughout the recording.      Impression: Focal seizure activity in a patient with multiple medical comorbidities and severe hyperglycemia   Recommendations: -Scheduled phenytoin 100 mg 3 times daily and continue for 24 hours after normalization of glucose (2000 on 7/12).  If seizures recur while normoglycemic, will need to restart Dilantin and discharge home on this medication with outpatient seizure precautions (including no driving until seizure free for 6 months) and outpatient Neurology follow up.  - D/C LTM today  -Maintain seizure precautions -2 mg Ativan IV for any seizure activity that does not self terminate within 2 minutes -Treatment of HHS and other comorbidities per primary team   Patient seen and examined by NP/APP.  Elmer Picker, DNP, FNP-BC [facility] Pager: [phone]  Electronically signed: Dr. Caryl Pina

## 2023-06-18 NOTE — Plan of Care (Signed)
  Problem: Education: Goal: Knowledge of General Education information will improve Description Including pain rating scale, medication(s)/side effects and non-pharmacologic comfort measures Outcome: Progressing   

## 2023-06-18 NOTE — Assessment & Plan Note (Signed)
Continue blood pressure control with diltiazem.

## 2023-06-18 NOTE — Assessment & Plan Note (Signed)
Patient has been placed on insulin drip with good response.  Her diet was advanced with good toleration.   She was successfully transitioned to sq insulin with good toleration. Her fasting glucose today is 149 mg/dl.   Plan to continue basal insulin 10 units along with insulin sliding scale.  Close follow up as outpatient.

## 2023-06-18 NOTE — Progress Notes (Signed)
LTM EEG discontinued - no skin breakdown at unhook.   

## 2023-06-19 DIAGNOSIS — N184 Chronic kidney disease, stage 4 (severe): Secondary | ICD-10-CM | POA: Diagnosis not present

## 2023-06-19 DIAGNOSIS — E1159 Type 2 diabetes mellitus with other circulatory complications: Secondary | ICD-10-CM | POA: Diagnosis not present

## 2023-06-19 DIAGNOSIS — E1100 Type 2 diabetes mellitus with hyperosmolarity without nonketotic hyperglycemic-hyperosmolar coma (NKHHC): Secondary | ICD-10-CM | POA: Diagnosis not present

## 2023-06-19 DIAGNOSIS — R569 Unspecified convulsions: Secondary | ICD-10-CM | POA: Diagnosis not present

## 2023-06-19 LAB — GLUCOSE, CAPILLARY
Glucose-Capillary: 201 mg/dL — ABNORMAL HIGH (ref 70–99)
Glucose-Capillary: 213 mg/dL — ABNORMAL HIGH (ref 70–99)
Glucose-Capillary: 172 mg/dL — ABNORMAL HIGH (ref 70–99)

## 2023-06-19 LAB — BASIC METABOLIC PANEL
Anion gap: 10 (ref 5–15)
BUN: 37 mg/dL — ABNORMAL HIGH (ref 8–23)
CO2: 22 mmol/L (ref 22–32)
Calcium: 8.4 mg/dL — ABNORMAL LOW (ref 8.9–10.3)
Chloride: 103 mmol/L (ref 98–111)
Creatinine, Ser: 6.03 mg/dL — ABNORMAL HIGH (ref 0.44–1.00)
GFR, Estimated: 7 mL/min — ABNORMAL LOW (ref >60)
Glucose, Bld: 149 mg/dL — ABNORMAL HIGH (ref 70–99)
Potassium: 3.7 mmol/L (ref 3.5–5.1)
Sodium: 135 mmol/L (ref 135–145)

## 2023-06-19 LAB — MAGNESIUM: Magnesium: 1.8 mg/dL (ref 1.7–2.4)

## 2023-06-19 MED ORDER — ACCU-CHEK GUIDE W/DEVICE KIT
1.0000 | PACK | Freq: Three times a day (TID) | 0 refills | Status: AC
Start: 2023-06-19 — End: ?

## 2023-06-19 MED ORDER — INSULIN SYRINGE-NEEDLE U-100 31G X 15/64" 0.5 ML MISC
0 refills | Status: AC
Start: 2023-06-19 — End: ?

## 2023-06-19 MED ORDER — PEN NEEDLES 31G X 5 MM MISC: 1.0000 | Freq: Three times a day (TID) | 0 refills | Status: AC

## 2023-06-19 MED ORDER — INSULIN SYRINGES (DISPOSABLE) U-100 1 ML MISC
2.0000 | Freq: Every day | 0 refills | Status: AC
Start: 2023-06-19 — End: ?

## 2023-06-19 MED ORDER — INSULIN GLARGINE 100 UNIT/ML ~~LOC~~ SOLN
10.0000 [IU] | Freq: Every day | SUBCUTANEOUS | 0 refills | Status: DC
Start: 2023-06-19 — End: 2024-01-04

## 2023-06-19 MED ORDER — INSULIN LISPRO (1 UNIT DIAL) 100 UNIT/ML (KWIKPEN): PEN_INJECTOR | SUBCUTANEOUS | 0 refills | Status: DC

## 2023-06-19 MED ORDER — INSULIN LISPRO (1 UNIT DIAL) 100 UNIT/ML (KWIKPEN): PEN_INJECTOR | SUBCUTANEOUS | 0 refills | Status: AC

## 2023-06-19 NOTE — Discharge Summary (Addendum)
Physician Discharge Summary   Patient: Nancy [name] MRN: [id number] DOB: 15-Dec-1955  Admit date:     06/17/2023  Discharge date: 06/19/23  Discharge Physician: Coralie Keens   PCP: Georgina Quint, MD   Recommendations at discharge:    Patient had no further seizures while her glucose was better controlled, no current indication for antiepileptic agents per neurology recommendations.  Patient has been placed back on insulin lantus 10 units daily and added insulin sliding scale.  Patient will need close follow up with nephrology, she will call Dr Malen Gauze office on Monday.  Follow up with Dr Alvy Bimler in 7 to 10 days.   Discharge Diagnoses: Principal Problem:   Hyperosmolar hyperglycemic state (HHS) (HCC) Active Problems:   Seizure (HCC)   Stage 4 chronic kidney disease (HCC)   Hypertension associated with diabetes (HCC)   Type 2 diabetes mellitus with hyperlipidemia (HCC)   Chronic diastolic CHF (congestive heart failure) (HCC)   Class 1 obesity  Resolved Problems:   * No resolved hospital problems. [facility] Course: Mrs. Nancy [name] was admitted to the hospital with the working diagnosis of hyperosmolar non ketotic state.   67 yo female with the past medical history of T2DM, hypertension, CKD, heart failure, hepatitis C, seizures and GERD who presented with altered mental status.  Recent hospitalization 05/07 to 04/22/23 for acute metabolic encephalopathy, noted to have hypoglycemic events, she was discharged on Trulicity and SGLT 2 inh.  At home she has not been compliant with Trulicity.  On the day of admission, she missed her diabetic medications. Later during the day she developed shaking movements on her left side, forearm and hand, then lost her consciousness. Episode lasted 15 minutes. EMS was called and she was transported to the ED. On route to the ED she had another episode that was treated with midazolam. On her initial ED evaluation her blood pressure was  162/82, HR 83, RR 18 and 02 saturation 96%, she was awake and alert, non focal, lungs with no wheezing or rales, heart with S1 and S2 present and rhythmic, abdomen with no distention and no lower extremity edema.   Na 126, K 4,5 CL 93, bicarbonate 13, glucose 917, bun 54 cr 6,65.  Serum osmolality 333 Wbc 8,1 hgb 9,9 plt 215  Urine analysis SG 1,014, protein 100, glucose >500, negative ketones, negative hgb, negative leukocytes.  Toxicology positive for benzodiazepines.   Head CT with no acute changes.  Chest radiograph with hypoinflation with no infiltrates or effusions.  EKG 85 bpm, normal axis, normal intervals, sinus rhythm with no significant ST segment or T wave changes.   Patient was placed on insulin drip for glucose control Neurology was consulted and she was placed on continuous EEG monitoring.   07/13 patient was transitioned to sq insulin with good toleration. She did have phenytoin during her hospitalization until 07/12. Her glucose improved and she had no further seizures. Plan to continue insulin therapy at home and have close follow up as outpatient.   Assessment and Plan: * Hyperosmolar hyperglycemic state (HHS) (HCC) Patient has been placed on insulin drip with good response.  Her diet was advanced with good toleration.   She was successfully transitioned to sq insulin with good toleration. Her fasting glucose today is 149 mg/dl.   Plan to continue basal insulin 10 units along with insulin sliding scale.  Close follow up as outpatient.   Seizure [facility]) Patient with no further seizures.  EEG with no further seizures. She did received  phenytoin while hospitalized, but discontinued when glucose better controlled.   Stage 4 chronic kidney disease (HCC) AKI, pseudohyponatremia.  Patient with no hypervolemia, her renal function today with serum cr at 6,0 with K at 3,7 and serum bicarbonate at 22.  Na 135 and Mg at 1.8   At discharge will resume furosemide as her  usual regimen.  Follow up with Nephrology as outpatient, call the office of Dr Malen Gauze on Monday for appointment.   Hypertension associated with diabetes (HCC) Continue blood pressure control with diltiazem.   Type 2 diabetes mellitus with hyperlipidemia (HCC) (Not type 1 DM, confirmed with patient).  Continue basal and sliding scale insulin, and short acting sliding scale.  Considering her low GFR she is at risk for hypoglycemia.   Continue with statin therapy.   Chronic diastolic CHF (congestive heart failure) (HCC) No signs of acute heart failure decompensation.  Continue diltiazem and further blood pressure monitoring.  Follow up as outpatient.   Class 1 obesity Calculated BMI is 34,4         Consultants: none  Procedures performed: none   Disposition: Home Diet recommendation:  Cardiac and Carb modified diet DISCHARGE MEDICATION: Allergies as of 06/19/2023       Reactions   Ace Inhibitors Other (See Comments), Cough   Persistent dry cough   Amitriptyline Hcl Nausea And Vomiting, Other (See Comments)   GI Intolerance/Upset stomach   Aspirin Other (See Comments)   GI Intolerance   Latex Itching   Prednisone Nausea And Vomiting        Medication List     STOP taking these medications    dapagliflozin propanediol 10 MG Tabs tablet Commonly known as: [place]   Trulicity 1.5 MG/0.5ML Sopn Generic drug: Dulaglutide       TAKE these medications    Accu-Chek FastClix Lancets Misc Use to check blood sugar up to 3 times a day   Accu-Chek Guide test strip Generic drug: glucose blood CHECK BLOOD SUGAR 3 TIMES A DAY   Accu-Chek Guide w/Device Kit 1 each by Does not apply route 3 (three) times daily. Check blood sugar 3 times a day   atorvastatin 40 MG tablet Commonly known as: LIPITOR TAKE 1 TABLET BY MOUTH EVERY DAY   cyclobenzaprine 10 MG tablet Commonly known as: FLEXERIL TAKE 1 TABLET BY MOUTH EVERYDAY AT BEDTIME   diclofenac Sodium 1 %  Gel Commonly known as: Voltaren Apply 2 g topically 4 (four) times daily.   Dilt-XR 240 MG 24 hr capsule Generic drug: diltiazem Take 1 capsule (240 mg total) by mouth daily.   furosemide 40 MG tablet Commonly known as: LASIX TAKE 1 TABLET BY MOUTH THREE TIMES PER WEEK   insulin glargine 100 UNIT/ML injection Commonly known as: Lantus Inject 0.1 mLs (10 Units total) into the skin daily. What changed:  how much to take how to take this when to take this additional instructions   insulin lispro 100 UNIT/ML KwikPen Commonly known as: HUMALOG For sugar 120 to 150 use one unit, for 151 to 200 use two units, for 201 to 250 use three units, for 251 to 300 use five units, for 301 to 350 use seven units, for 351 to 400 use 9 units, if sugar more than 400 call your primary care provider.   Insulin Syringe-Needle U-100 31G X 15/64" 0.5 ML Misc Use to inject insulin one time a day   Insulin Syringes (Disposable) U-100 1 ML Misc 2 Syringes by Does not apply route daily.  Use to inject insulin into the skin 1 time daily. diag code E11.9. Insulin dependent   omeprazole 40 MG capsule Commonly known as: PRILOSEC TAKE 1 CAPSULE BY MOUTH EVERY DAY What changed: how much to take   Pen Needles 31G X 5 MM Misc 1 each by Does not apply route 3 (three) times daily with meals. May substitute to any manufacturer covered by patient's insurance.   senna 8.6 MG tablet Commonly known as: Senokot Take 1 tablet (8.6 mg total) by mouth as needed for constipation.   Vitamin D3 25 MCG (1000 UT) Caps Take 1 capsule by mouth daily.        Discharge Exam: Filed Weights   06/17/23 1845 06/18/23 0500 06/19/23 0558  Weight: 70.8 kg 72.1 kg 73.1 kg   BP (!) 174/92 (BP Location: Left Arm)   Pulse 78   Temp 98.5 F (36.9 C) (Oral)   Resp 18   Ht 4\' 9"  (1.448 m)   Wt 73.1 kg   LMP 02/15/2009   SpO2 100%   BMI 34.87 kg/m   Patient with no chest pain or dyspnea, no further seizures, no nausea or  vomiting.   Neurology awake and alert ENT with mild pallor Cardiovascular with S1 and S2 present and rhythmic Respiratory with no rales or wheezing Abdomen with no distention No lower extremity edema   Condition at discharge: stable  The results of significant diagnostics from this hospitalization (including imaging, microbiology, ancillary and laboratory) are listed below for reference.   Imaging Studies: Overnight EEG with video  Result Date: 06/18/2023 Charlsie Quest, MD     06/19/2023  6:17 AM Patient Name: Nolani Norberto MRN: [id number] Epilepsy Attending: Charlsie Quest Referring Physician/Provider: Caryl Pina, MD Duration: 06/17/2023 1943 to 06/18/2023 1647 Patient history: 67 year old patient with history of CKD stage V, CHF and diabetes presented to the ED today with seizure activity in conjunction with severe hyperglycemia.  Patient states that for about the past month, she has had intermittent twitching and jerking movements of the left arm which sometimes spread to the leg and face.  While en route, she had witnessed tonic-clonic activity and was administered Versed.  During exam in the ED she began to have focal twitching and jerking motions of the left forearm which she was unable to suppress and which self terminated in about 30 seconds. RN stated she had had other similar episodes. EEG to evaluate for seizure. Level of alertness: Awake, asleep AEDs during EEG study: PHT Technical aspects: This EEG study was done with scalp electrodes positioned according to the 10-20 International system of electrode placement. Electrical activity was reviewed with band pass filter of 1-70Hz , sensitivity of 7 uV/mm, display speed of 41mm/sec with a 60Hz  notched filter applied as appropriate. EEG data were recorded continuously and digitally stored.  Video monitoring was available and reviewed as appropriate. Description: The posterior dominant rhythm consists of 7 Hz activity of moderate voltage  (25-35 uV) seen predominantly in posterior head regions, symmetric and reactive to eye opening and eye closing. Sleep was characterized by vertex waves, sleep spindles (12 to 14 Hz), maximal frontocentral region. EEG showed continuous generalized 5 to 7 Hz theta slowing. Hyperventilation and photic stimulation were not performed.   ABNORMALITY - Continuous slow, generalized IMPRESSION: This study is suggestive of moderate diffuse encephalopathy, nonspecific etiology. No seizures or epileptiform discharges were seen throughout the recording. Please note lack of epileptiform activity during interictal EEG does not exclude the diagnosis of epilepsy. Priyanka O  Yadav   CT Head Wo Contrast  Result Date: 06/17/2023 CLINICAL DATA:  Seizures EXAM: CT HEAD WITHOUT CONTRAST TECHNIQUE: Contiguous axial images were obtained from the base of the skull through the vertex without intravenous contrast. RADIATION DOSE REDUCTION: This exam was performed according to the departmental dose-optimization program which includes automated exposure control, adjustment of the mA and/or kV according to patient size and/or use of iterative reconstruction technique. COMPARISON:  04/13/2023 FINDINGS: Brain: No acute intracranial findings are seen. There are no signs of bleeding within the cranium. Cortical sulci are prominent. Vascular: Unremarkable. Skull: No acute findings are seen. Sinuses/Orbits: There is mucous retention cyst in right maxillary sinus. Other: None. IMPRESSION: No acute intracranial findings are seen.  Atrophy. Chronic sinusitis. Electronically Signed   By: Ernie Avena M.D.   On: 06/17/2023 15:59   DG Chest Portable 1 View  Result Date: 06/17/2023 CLINICAL DATA:  Hyperglycemia. EXAM: PORTABLE CHEST 1 VIEW COMPARISON:  05/19/2023. FINDINGS: Low lung volumes accentuate the pulmonary vasculature and cardiomediastinal silhouette. No consolidation or pulmonary edema. No pleural effusion or pneumothorax. IMPRESSION:  Low lung volumes without evidence of acute cardiopulmonary disease. Electronically Signed   By: Orvan Falconer M.D.   On: 06/17/2023 15:10    Microbiology: Results for orders placed or performed during the hospital encounter of 04/13/23  MRSA Next Gen by PCR, Nasal     Status: None   Collection Time: 04/13/23  2:11 PM   Specimen: Nasal Mucosa; Nasal Swab  Result Value Ref Range Status   MRSA by PCR Next Gen NOT DETECTED NOT DETECTED Final    Comment: (NOTE) The GeneXpert MRSA Assay (FDA approved for NASAL specimens only), is one component of a comprehensive MRSA colonization surveillance program. It is not intended to diagnose MRSA infection nor to guide or monitor treatment for MRSA infections. Test performance is not FDA approved in patients less than 54 years old. Performed at [facility], [address], [place], [place] [address]     Labs: CBC: Recent Labs  Lab 06/17/23 1423 06/17/23 1451 06/17/23 1538 06/18/23 0805  WBC 8.1  --   --  9.3  NEUTROABS 4.8  --   --   --   HGB 9.9* 9.9* 10.2* 10.3*  HCT 30.3* 29.0* 30.0* 29.8*  MCV 82.6  --   --  77.8*  PLT 215  --   --  239   Basic Metabolic Panel: Recent Labs  Lab 06/17/23 2019 06/17/23 2202 06/18/23 0458 06/18/23 0805 06/19/23 0036  NA 137 138 137 137 135  K 3.5 3.8 3.4* 3.6 3.7  CL 99 101 103 105 103  CO2 21* 21* 20* 22 22  GLUCOSE 162* 169* 167* 190* 149*  BUN 49* 48* 43* 41* 37*  CREATININE 6.01* 5.80* 5.74* 5.73* 6.03*  CALCIUM 8.5* 8.6* 8.5* 8.4* 8.4*  MG  --   --   --   --  1.8   Liver Function Tests: Recent Labs  Lab 06/17/23 2019  AST 13*  ALT 14  ALKPHOS 91  BILITOT 0.4  PROT 6.6  ALBUMIN 2.9*   CBG: Recent Labs  Lab 06/18/23 1201 06/18/23 1535 06/18/23 2106 06/19/23 0602 06/19/23 1157  GLUCAP 237* 145* 101* 201* 213*    Discharge time spent: greater than 30 minutes.  Signed: Coralie Keens, MD [facility] 06/19/2023

## 2023-06-19 NOTE — Plan of Care (Signed)
  Problem: Health Behavior/Discharge Planning: Goal: Ability to manage health-related needs will improve Outcome: Progressing   Problem: Education: Goal: Knowledge of General Education information will improve Description: Including pain rating scale, medication(s)/side effects and non-pharmacologic comfort measures Outcome: Progressing   Problem: Clinical Measurements: Goal: Ability to maintain clinical measurements within normal limits will improve Outcome: Progressing   Problem: Clinical Measurements: Goal: Will remain free from infection Outcome: Progressing   

## 2023-06-19 NOTE — Evaluation (Signed)
Occupational Therapy Evaluation Patient Details Name: Nancy [name] MRN: [id number] DOB: [birth year] Today's Date: 06/19/2023   History of Present Illness Nancy [name] is  67 yo female who was admitted to the hospital with the working diagnosis of hyperosmolar non ketotic state with the past medical history of T2DM, hypertension, CKD, heart failure, hepatitis C, seizures and GERD who presented with altered mental status.   Recent hospitalization 05/07 to 04/22/23 for acute metabolic encephalopathy, noted to have hypoglycemic events.   Clinical Impression   Pt currently at supervision level for simulated selfcare tasks and functional transfers to the toilet and simulated shower/tub without assistive device.  Pt's HR stable in the low to mid 80s throughout functional mobility to the end of the hallway and back (over 300') without assistive device.  She lives alone but has assist from her mother if needed.  Recommend shower seat for safety.  Feel with continued participation in ADL tasks balance will continue to improve.  Do not feel currently that she warrants furtheracute or post acute OT needs at this time.       Recommendations for follow up therapy are one component of a multi-disciplinary discharge planning process, led by the attending physician.  Recommendations may be updated based on patient status, additional functional criteria and insurance authorization.   Assistance Recommended at Discharge Intermittent Supervision/Assistance     Functional Status Assessment  Patient has not had a recent decline in their functional status  Equipment Recommendations  None recommended by OT       Precautions / Restrictions Precautions Precautions: Fall Restrictions Weight Bearing Restrictions: No      Mobility Bed Mobility Overal bed mobility: Needs Assistance Bed Mobility: Supine to Sit, Sit to Supine     Supine to sit: Supervision Sit to supine: Supervision         Transfers Overall transfer level: Needs assistance Equipment used: None Transfers: Sit to/from Stand, Bed to chair/wheelchair/BSC Sit to Stand: Supervision     Step pivot transfers: Supervision            Balance Overall balance assessment: Mild deficits observed, not formally tested                                         ADL either performed or assessed with clinical judgement   ADL Overall ADL's : Needs assistance/impaired Eating/Feeding: Independent;Sitting   Grooming: Wash/dry hands;Standing Grooming Details (indicate cue type and reason): simulated Upper Body Bathing: Set up;Sitting Upper Body Bathing Details (indicate cue type and reason): simulated Lower Body Bathing: Supervison/ safety;Sit to/from stand Lower Body Bathing Details (indicate cue type and reason): simulated Upper Body Dressing : Sitting;Set up Upper Body Dressing Details (indicate cue type and reason): simulated Lower Body Dressing: Set up;Sit to/from stand   Toilet Transfer: Supervision/safety;Ambulation   Toileting- Clothing Manipulation and Hygiene: Supervision/safety;Sit to/from stand   Tub/ Shower Transfer: Supervision/safety;Ambulation Tub/Shower Transfer Details (indicate cue type and reason): simulated stepping over tub Functional mobility during ADLs: Supervision/safety General ADL Comments: Pt reports some equipment may be at her mom's house from last visit.  She says she was going to get a shower seat and walker.  Discussed calling her mom and having her open the boxes to see if she recieved this.  May benefit from RW initially depending on balance progress.  No LOB with mobility but occasional stepping strategy needed to correct balance when stopping to  open the door to the bathroom.  Recommend initial use of shower seat at home as well.     Vision Baseline Vision/History: 1 Wears glasses Ability to See in Adequate Light: 0 Adequate Patient Visual Report: No change  from baseline Vision Assessment?: No apparent visual deficits     Perception  Not tested   Praxis  Not tested    Pertinent Vitals/Pain Pain Assessment Pain Assessment: No/denies pain     Hand Dominance Right   Extremity/Trunk Assessment Upper Extremity Assessment Upper Extremity Assessment: Overall WFL for tasks assessed   Lower Extremity Assessment Lower Extremity Assessment: Defer to PT evaluation   Cervical / Trunk Assessment Cervical / Trunk Assessment: Normal   Communication Communication Communication: Other (comment) (stutters which she reports is new)   Cognition Arousal/Alertness: Awake/alert Behavior During Therapy: WFL for tasks assessed/performed Overall Cognitive Status: Within Functional Limits for tasks assessed                                 General Comments: Pt oriented to place, day of week, month, and year.  Able to state she was here because of her "blood sugar being off".                Home Living Family/patient expects to be discharged to:: Private residence Living Arrangements: Alone Available Help at Discharge: Family;Available 24 hours/day (has her mom and other friends) Type of Home: Apartment Home Access: Elevator     Home Layout: One level     Bathroom Shower/Tub: Chief Strategy Officer: Standard     Home Equipment: None   Additional Comments: may have walker and wheelchair at [facility] but not sure      Prior Functioning/Environment Prior Level of Function : Independent/Modified Independent                    AM-PAC OT "6 Clicks" Daily Activity     Outcome Measure Help from another person eating meals?: None Help from another person taking care of personal grooming?: None Help from another person toileting, which includes using toliet, bedpan, or urinal?: None Help from another person bathing (including washing, rinsing, drying)?: None Help from another person to put on and taking  off regular upper body clothing?: None Help from another person to put on and taking off regular lower body clothing?: None 6 Click Score: 24   End of Session Equipment Utilized During Treatment: Gait belt Nurse Communication: Mobility status  Activity Tolerance: Patient tolerated treatment well Patient left: in bed;with call bell/phone within reach                   Time: 1118-1155 OT Time Calculation (min): 37 min Charges:  OT General Charges $OT Visit: 1 Visit OT Evaluation $OT Eval Moderate Complexity: 1 Mod OT Treatments $Self Care/Home Management : 8-22 mins Perrin Maltese, OTR/L Acute Rehabilitation Services  Office [phone] 06/19/2023

## 2023-06-19 NOTE — Evaluation (Signed)
Physical Therapy Evaluation Patient Details Name: Kashena Whidby MRN: [id number] DOB: 04/03/1956 Today's Date: 06/19/2023  History of Present Illness  Mrs. Coole is  67 yo female who was admitted to the hospital with the working diagnosis of hyperosmolar non ketotic state with the past medical history of T2DM, hypertension, CKD, heart failure, hepatitis C, seizures and GERD who presented with altered mental status.   Recent hospitalization 05/07 to 04/22/23 for acute metabolic encephalopathy, noted to have hypoglycemic events.   Clinical Impression  Pt reports independence with functional mobility and ADL's PTA, no AD at baseline. Today, pt with supervision for all mobility and amb of 250'. Pt assessed for higher level balance using DGI, with slight deficits in head turns and stairs. Pt reports feeling at baseline level of mobility functioning. PT educated on energy conservation techniques concluding the session. At this time pt does not require skilled PT, will d/c. Thank you for the consult.      Assistance Recommended at Discharge PRN  If plan is discharge home, recommend the following:  Can travel by private vehicle  A little help with walking and/or transfers;Assistance with cooking/housework;Assist for transportation;Help with stairs or ramp for entrance        Equipment Recommendations None recommended by PT  Recommendations for Other Services       Functional Status Assessment Patient has not had a recent decline in their functional status     Precautions / Restrictions Precautions Precautions: Fall Restrictions Weight Bearing Restrictions: No      Mobility  Bed Mobility Overal bed mobility: Independent Bed Mobility: Supine to Sit, Sit to Supine     Supine to sit: Independent Sit to supine: Independent        Transfers Overall transfer level: Needs assistance Equipment used: None Transfers: Sit to/from Stand Sit to Stand: Supervision            General transfer comment: Adequate power to achieve stand, uses BUE to push and to eccentrically lwoer.    Ambulation/Gait Ambulation/Gait assistance: Supervision Gait Distance (Feet): 250 Feet Assistive device: None Gait Pattern/deviations: Step-through pattern, Decreased stride length, Narrow base of support   Gait velocity interpretation: >2.62 ft/sec, indicative of community ambulatory   General Gait Details: No LOB, deficits listed in DGI balance assessment below.  Stairs Stairs: Yes Stairs assistance: Min guard Stair Management: One rail Right, Alternating pattern, Step to pattern Number of Stairs: 5 General stair comments: Ascends with alternating pattern, descends with step to. No LOB observed. Pt does report feeling dizzy when looking down from a height.  Wheelchair Mobility     Tilt Bed    Modified Rankin (Stroke Patients Only)       Balance Overall balance assessment: Independent                               Standardized Balance Assessment Standardized Balance Assessment : Dynamic Gait Index   Dynamic Gait Index Level Surface: Normal Change in Gait Speed: Normal Gait with Horizontal Head Turns: Mild Impairment Gait with Vertical Head Turns: Mild Impairment Gait and Pivot Turn: Normal Step Over Obstacle: Normal Step Around Obstacles: Normal Steps: Mild Impairment Total Score: 21       Pertinent Vitals/Pain Pain Assessment Pain Assessment: No/denies pain    Home Living Family/patient expects to be discharged to:: Private residence Living Arrangements: Alone Available Help at Discharge: Family;Available 24 hours/day Type of Home: Apartment Home Access: Elevator  Home Layout: One level Home Equipment: None Additional Comments: may have walker and wheelchair at mom's house but not sure    Prior Function Prior Level of Function : Independent/Modified Independent             Mobility Comments: Doesn't use an AD at  baseline       Hand Dominance   Dominant Hand: Right    Extremity/Trunk Assessment   Upper Extremity Assessment Upper Extremity Assessment: Defer to OT evaluation    Lower Extremity Assessment Lower Extremity Assessment: Overall WFL for tasks assessed    Cervical / Trunk Assessment Cervical / Trunk Assessment: Normal  Communication   Communication: Other (comment)  Cognition Arousal/Alertness: Awake/alert Behavior During Therapy: WFL for tasks assessed/performed Overall Cognitive Status: Within Functional Limits for tasks assessed                                 General Comments: Pt eager to return to life, amenable to any therapy task she must complete before being safe for d/c.        General Comments      Exercises     Assessment/Plan    PT Assessment Patient does not need any further PT services  PT Problem List         PT Treatment Interventions      PT Goals (Current goals can be found in the Care Plan section)  Acute Rehab PT Goals Patient Stated Goal: get home PT Goal Formulation: All assessment and education complete, DC therapy    Frequency       Co-evaluation               AM-PAC PT "6 Clicks" Mobility  Outcome Measure Help needed turning from your back to your side while in a flat bed without using bedrails?: None Help needed moving from lying on your back to sitting on the side of a flat bed without using bedrails?: None Help needed moving to and from a bed to a chair (including a wheelchair)?: A Little Help needed standing up from a chair using your arms (e.g., wheelchair or bedside chair)?: A Little Help needed to walk in hospital room?: A Little Help needed climbing 3-5 steps with a railing? : A Little 6 Click Score: 20    End of Session Equipment Utilized During Treatment: Gait belt Activity Tolerance: Patient tolerated treatment well Patient left: in bed;with call bell/phone within reach Nurse Communication:  Mobility status PT Visit Diagnosis: Unsteadiness on feet (R26.81)    Time: 0981-1914 PT Time Calculation (min) (ACUTE ONLY): 8 min   Charges:   PT Evaluation $PT Eval Low Complexity: 1 Low   PT General Charges $$ ACUTE PT VISIT: 1 Visit         Hendricks Milo, SPT  Acute Rehabilitation Services   Hendricks Milo 06/19/2023, 4:03 PM

## 2023-06-19 NOTE — Plan of Care (Signed)

## 2023-06-22 ENCOUNTER — Telehealth: Payer: Self-pay | Admitting: Emergency Medicine

## 2023-06-22 ENCOUNTER — Telehealth: Payer: Self-pay

## 2023-06-22 DIAGNOSIS — N185 Chronic kidney disease, stage 5: Secondary | ICD-10-CM

## 2023-06-22 DIAGNOSIS — Z794 Long term (current) use of insulin: Secondary | ICD-10-CM

## 2023-06-22 DIAGNOSIS — N184 Chronic kidney disease, stage 4 (severe): Secondary | ICD-10-CM

## 2023-06-22 NOTE — Telephone Encounter (Signed)
Nancy [name] with [employer] called wanted Dr. Alvy Bimler to prescribe pt a glucose monitor (Dexacom 7) because pt is dealing with her sugars levels.  Alcario Drought also mention pt would like to be referred to another location (kidney Dr) .

## 2023-06-22 NOTE — Transitions of Care (Post Inpatient/ED Visit) (Signed)
06/22/2023  Name: Nancy [name] MRN: [id number] DOB: [birth year]  Today's TOC FU Call Status: Today's TOC FU Call Status:: Successful TOC FU Call Competed TOC FU Call Complete Date: 06/22/23  Transition Care Management Follow-up Telephone Call Date of Discharge: 06/19/23 Discharge Facility: [facility]) Type of Discharge: Inpatient Admission Primary Inpatient Discharge Diagnosis:: "hyerosmolar hyperglycemic state" How have you been since you were released from the hospital?: Better (Pt voices she is "doing okay-not quite back to 100% yet." She is resting fairly well at night-takes naps durign the day. Appetite is fair. She is just getting up-hasn't checked blood sugar yet today-states it was 68 yest morning.) Any questions or concerns?: No  Items Reviewed: Did you receive and understand the discharge instructions provided?: Yes Medications obtained,verified, and reconciled?: Yes (Medications Reviewed) Any new allergies since your discharge?: No Dietary orders reviewed?: Yes Type of Diet Ordered:: low salt/heart healty/carb modified Do you have support at home?: Yes People in Home: other relative(s) Name of Support/Comfort Primary Source: pt states her uncle and mom helps her out  Medications Reviewed Today: Medications Reviewed Today     Reviewed by Charlyn Minerva, RN (Registered Nurse) on 06/22/23 at 1011  Med List Status: <None>   Medication Order Taking? Sig Documenting Provider Last Dose Status Informant  ACCU-CHEK FASTCLIX LANCETS MISC [id number] Yes Use to check blood sugar up to 3 times a day Eulah Pont, MD Taking Active Self, Pharmacy Records  ACCU-CHEK GUIDE test strip [id number] Yes CHECK BLOOD SUGAR 3 TIMES A DAY [name], [name], MD Taking Active Self, Pharmacy Records  atorvastatin (LIPITOR) 40 MG tablet [id number] Yes TAKE 1 TABLET BY MOUTH EVERY DAY  Patient taking differently: Take 40 mg by mouth daily.   Georgina Quint, MD Taking Active  Self, Pharmacy Records  Blood Glucose Monitoring Suppl (ACCU-CHEK GUIDE) w/Device Andria Rhein [id number] Yes 1 each by Does not apply route 3 (three) times daily. Check blood sugar 3 times a day Arrien, York Ram, MD Taking Active   Cholecalciferol (VITAMIN D3) 25 MCG (1000 UT) CAPS [id number] Yes Take 1 capsule by mouth daily. [provider] Taking Active Self, Pharmacy Records  cyclobenzaprine (FLEXERIL) 10 MG tablet [id number] Yes TAKE 1 TABLET BY MOUTH EVERYDAY AT BEDTIME Corwin Levins, MD Taking Active Self, Pharmacy Records  diclofenac Sodium (VOLTAREN) 1 % GEL [id number] Yes Apply 2 g topically 4 (four) times daily. Adron Bene, MD Taking Active Self, Pharmacy Records  DILT-XR 240 MG 24 hr capsule [id number] Yes Take 1 capsule (240 mg total) by mouth daily. Georgina Quint, MD Taking Active Self, Pharmacy Records  furosemide (LASIX) 40 MG tablet [id number] Yes TAKE 1 TABLET BY MOUTH THREE TIMES PER WEEK Georgina Quint, MD Taking Active Self, Pharmacy Records  insulin glargine (LANTUS) 100 UNIT/ML injection [id number] Yes Inject 0.1 mLs (10 Units total) into the skin daily. Arrien, York Ram, MD Taking Active   insulin lispro (HUMALOG) 100 UNIT/ML KwikPen [id number] Yes For sugar 120 to 150 use one unit, for 151 to 200 use two units, for 201 to 250 use three units, for 251 to 300 use five units, for 301 to 350 use seven units, for 351 to 400 use 9 units, if sugar more than 400 call your primary care provider. Arrien, York Ram, MD Taking Active   Insulin Pen Needle (PEN NEEDLES) 31G X 5 MM MISC [id number] Yes 1 each by Does not apply route 3 (three) times daily with meals. May substitute to any manufacturer covered by  patient's insurance. Arrien, York Ram, MD Taking Active   Insulin Syringe-Needle U-100 31G X 15/64" 0.5 ML MISC [id number] Yes Use to inject insulin one time a day Arrien, York Ram, MD Taking Active   Insulin Syringes, Disposable, U-100 1 ML  MISC [id number] Yes 2 Syringes by Does not apply route daily. Use to inject insulin into the skin 1 time daily. diag code E11.9. Insulin dependent Arrien, York Ram, MD Taking Active   omeprazole (PRILOSEC) 40 MG capsule [id number] Yes TAKE 1 CAPSULE BY MOUTH EVERY DAY  Patient taking differently: Take 40 mg by mouth daily.   Georgina Quint, MD Taking Active Self, Pharmacy Records  senna [facility]) 8.6 MG tablet [id number] Yes Take 1 tablet (8.6 mg total) by mouth as needed for constipation. Merrilyn Puma, MD Taking Active Self, Pharmacy Records            Home Care and Equipment/Supplies: Were Home Health Services Ordered?: NA Any new equipment or medical supplies ordered?: NA  Functional Questionnaire: Do you need assistance with bathing/showering or dressing?: No Do you need assistance with meal preparation?: No Do you need assistance with eating?: No Do you have difficulty maintaining continence: No Do you need assistance with getting out of bed/getting out of a chair/moving?: No Do you have difficulty managing or taking your medications?: No  Follow up appointments reviewed: PCP Follow-up appointment confirmed?: Yes Date of PCP follow-up appointment?: 06/29/23 Follow-up Provider: [facility] Follow-up appointment confirmed?: Yes Date of Specialist follow-up appointment?: 06/30/23 Follow-Up Specialty Provider:: Dr. Izora Ribas, pt will call renal MD office to make an appt-states she is in the process of changing MDs within that office Do you need transportation to your follow-up appointment?: No (pt confiirms her uncle will take her to appts) Do you understand care options if your condition(s) worsen?: Yes-patient verbalized understanding  SDOH Interventions Today    Flowsheet Row Most Recent Value  SDOH Interventions   Food Insecurity Interventions Intervention Not Indicated  Transportation Interventions Intervention Not Indicated       TOC Interventions Today    Flowsheet Row Most Recent Value  TOC Interventions   TOC Interventions Discussed/Reviewed TOC Interventions Discussed, Arranged PCP follow up less than 12 days/Care Guide scheduled      Interventions Today    Flowsheet Row Most Recent Value  Chronic Disease   Chronic disease during today's visit Diabetes  General Interventions   General Interventions Discussed/Reviewed General Interventions Discussed, Durable Medical Equipment (DME), Referral to Nurse, Doctor Visits  [follow up appt scheduled with assigned RN care coordinator for ongoing DM mgmt/education(A1C 11.5)]  Doctor Visits Discussed/Reviewed Doctor Visits Discussed, PCP, Specialist  Durable Medical Equipment (DME) Glucomoter  [pt checking cbgs in home-encouraged to take meter and/or cbg log to MD appts]  PCP/Specialist Visits Compliance with follow-up visit  Education Interventions   Education Provided Provided Education  Provided Verbal Education On Nutrition, Blood Sugar Monitoring, Medication, When to see the doctor  Nutrition Interventions   Nutrition Discussed/Reviewed Nutrition Discussed, Increasing proteins, Adding fruits and vegetables, Decreasing fats, Decreasing salt, Fluid intake, Decreasing sugar intake  Pharmacy Interventions   Pharmacy Dicussed/Reviewed Pharmacy Topics Discussed, Medications and their functions  Safety Interventions   Safety Discussed/Reviewed Safety Discussed       Alessandra Grout [facility] Care Management Care Management Community Coordinator Direct Phone: [phone] Toll Free: [phone] Fax: [phone]

## 2023-06-23 MED ORDER — DEXCOM G7 RECEIVER DEVI: 3 refills | Status: DC

## 2023-06-23 MED ORDER — DEXCOM G7 SENSOR MISC: 3 refills | Status: DC

## 2023-06-23 NOTE — Telephone Encounter (Signed)
Called pt verified pharmacy. Also ask pt about kidney dr. [name] states she was seeing Dr. Vallery Sa, but want to see someone else.Marland KitchenRaechel [name]

## 2023-06-23 NOTE — Telephone Encounter (Signed)
Place referral to new nephrologist please.

## 2023-06-23 NOTE — Addendum Note (Signed)
Addended by: Deatra James on: 06/23/2023 12:44 PM   Modules accepted: Orders

## 2023-06-23 NOTE — Telephone Encounter (Signed)
Place referral to nephrology.Marland KitchenRaechel Chute

## 2023-06-23 NOTE — Telephone Encounter (Signed)
Okay to prescribe Dexacom, as requested.  Patient has been referred to kidney doctor in the past.  Not sure what other referral they are requesting.

## 2023-06-29 ENCOUNTER — Encounter: Payer: Self-pay | Admitting: Emergency Medicine

## 2023-06-29 ENCOUNTER — Ambulatory Visit (INDEPENDENT_AMBULATORY_CARE_PROVIDER_SITE_OTHER): Payer: 59 | Admitting: Emergency Medicine

## 2023-06-29 VITALS — BP 180/110 | HR 86 | Temp 97.7°F | Ht <= 58 in | Wt 156.5 lb

## 2023-06-29 DIAGNOSIS — N185 Chronic kidney disease, stage 5: Secondary | ICD-10-CM

## 2023-06-29 DIAGNOSIS — E1159 Type 2 diabetes mellitus with other circulatory complications: Secondary | ICD-10-CM | POA: Diagnosis not present

## 2023-06-29 DIAGNOSIS — E785 Hyperlipidemia, unspecified: Secondary | ICD-10-CM

## 2023-06-29 DIAGNOSIS — E1169 Type 2 diabetes mellitus with other specified complication: Secondary | ICD-10-CM | POA: Diagnosis not present

## 2023-06-29 DIAGNOSIS — K219 Gastro-esophageal reflux disease without esophagitis: Secondary | ICD-10-CM

## 2023-06-29 DIAGNOSIS — I152 Hypertension secondary to endocrine disorders: Secondary | ICD-10-CM

## 2023-06-29 DIAGNOSIS — Z09 Encounter for follow-up examination after completed treatment for conditions other than malignant neoplasm: Secondary | ICD-10-CM

## 2023-06-29 MED ORDER — DILT-XR 240 MG PO CP24: 240.0000 mg | ORAL_CAPSULE | Freq: Every day | ORAL | 2 refills | Status: DC

## 2023-06-29 NOTE — Assessment & Plan Note (Signed)
Not on dialysis yet On kidney transplant list Follows up with nephrologist on a regular basis

## 2023-06-29 NOTE — Addendum Note (Signed)
Addended by: Evie Lacks on: 06/29/2023 05:05 PM   Modules accepted: Level of Service

## 2023-06-29 NOTE — Assessment & Plan Note (Signed)
Elevated blood pressure reading in the office.  Has been off her medication for couple days. Normally takes Cardizem 240 mg daily and has been on this medication for a long while. Well-controlled diabetes on insulin alone. Presently taking 10 units of basal insulin, insulin glargine daily and Premeal insulin lispro as per sliding scale No other medications at present time Diet and nutrition discussed Hypoglycemia instructions given ED precautions given

## 2023-06-29 NOTE — Progress Notes (Signed)
Nancy [name] 67 y.o.   Chief Complaint  Patient presents with   Hospitalization Follow-up    Patient was seen in the hospital. Patient states she was feeling ok since being home. Having muscle spasms, shaking in her left hand.     HISTORY OF PRESENT ILLNESS: This is a 67 y.o. female here for hospital discharge follow-up Admitted on 06/17/2023 with hyperosmolar diabetic coma and seizures.  Discharged on 06/19/2023. History of diabetes and chronic kidney disease.  On kidney transplant list.  Sees nephrologist on a regular basis. Presently only on insulin basal and Premeal Glucose this morning was 101. Doing well.  Elevated blood pressure.  Not taking blood pressure medication at present time. Discharge summary as follows: Physician Discharge Summary    Patient: Nancy [name] MRN: [id number] DOB: September 02, 1956  Admit date:     06/17/2023  Discharge date: 06/19/23  Discharge Physician: Coralie Keens    PCP: Georgina Quint, MD    Recommendations at discharge:     Patient had no further seizures while her glucose was better controlled, no current indication for antiepileptic agents per neurology recommendations.  Patient has been placed back on insulin lantus 10 units daily and added insulin sliding scale.  Patient will need close follow up with nephrology, she will call Dr Malen Gauze office on Monday.  Follow up with Dr Alvy Bimler in 7 to 10 days.    Discharge Diagnoses: Principal Problem:   Hyperosmolar hyperglycemic state (HHS) (HCC) Active Problems:   Seizure (HCC)   Stage 4 chronic kidney disease (HCC)   Hypertension associated with diabetes (HCC)   Type 2 diabetes mellitus with hyperlipidemia (HCC)   Chronic diastolic CHF (congestive heart failure) (HCC)   Class 1 obesity   Resolved Problems:   * No resolved hospital problems. [facility] Course: Nancy [name] was admitted to the hospital with the working diagnosis of hyperosmolar non ketotic state.    67 yo  female with the past medical history of T2DM, hypertension, CKD, heart failure, hepatitis C, seizures and GERD who presented with altered mental status.  Recent hospitalization 05/07 to 04/22/23 for acute metabolic encephalopathy, noted to have hypoglycemic events, she was discharged on Trulicity and SGLT 2 inh.  At home she has not been compliant with Trulicity.  On the day of admission, she missed her diabetic medications. Later during the day she developed shaking movements on her left side, forearm and hand, then lost her consciousness. Episode lasted 15 minutes. EMS was called and she was transported to the ED. On route to the ED she had another episode that was treated with midazolam. On her initial ED evaluation her blood pressure was 162/82, HR 83, RR 18 and 02 saturation 96%, she was awake and alert, non focal, lungs with no wheezing or rales, heart with S1 and S2 present and rhythmic, abdomen with no distention and no lower extremity edema.    Na 126, K 4,5 CL 93, bicarbonate 13, glucose 917, bun 54 cr 6,65.  Serum osmolality 333 Wbc 8,1 hgb 9,9 plt 215  Urine analysis SG 1,014, protein 100, glucose >500, negative ketones, negative hgb, negative leukocytes.  Toxicology positive for benzodiazepines.    Head CT with no acute changes.  Chest radiograph with hypoinflation with no infiltrates or effusions.   EKG 85 bpm, normal axis, normal intervals, sinus rhythm with no significant ST segment or T wave changes.    Patient was placed on insulin drip for glucose control Neurology was consulted and she  was placed on continuous EEG monitoring.    07/13 patient was transitioned to sq insulin with good toleration. She did have phenytoin during her hospitalization until 07/12. Her glucose improved and she had no further seizures. Plan to continue insulin therapy at home and have close follow up as outpatient.    Assessment and Plan: * Hyperosmolar hyperglycemic state (HHS) (HCC) Patient has  been placed on insulin drip with good response.  Her diet was advanced with good toleration.    She was successfully transitioned to sq insulin with good toleration. Her fasting glucose today is 149 mg/dl.    Plan to continue basal insulin 10 units along with insulin sliding scale.  Close follow up as outpatient.    Seizure [facility]) Patient with no further seizures.  EEG with no further seizures. She did received phenytoin while hospitalized, but discontinued when glucose better controlled.    Stage 4 chronic kidney disease (HCC) AKI, pseudohyponatremia.   Patient with no hypervolemia, her renal function today with serum cr at 6,0 with K at 3,7 and serum bicarbonate at 22.  Na 135 and Mg at 1.8    At discharge will resume furosemide as her usual regimen.  Follow up with Nephrology as outpatient, call the office of Dr Malen Gauze on Monday for appointment.    Hypertension associated with diabetes (HCC) Continue blood pressure control with diltiazem.    Type 2 diabetes mellitus with hyperlipidemia (HCC) (Not type 1 DM, confirmed with patient).  Continue basal and sliding scale insulin, and short acting sliding scale.  Considering her low GFR she is at risk for hypoglycemia.    Continue with statin therapy.    Chronic diastolic CHF (congestive heart failure) (HCC) No signs of acute heart failure decompensation.  Continue diltiazem and further blood pressure monitoring.  Follow up as outpatient.    Class 1 obesity Calculated BMI is 34,4      HPI   Prior to Admission medications   Medication Sig Start Date End Date Taking? Authorizing Provider  ACCU-CHEK FASTCLIX LANCETS MISC Use to check blood sugar up to 3 times a day 10/11/18  Yes Eulah Pont, MD  ACCU-CHEK GUIDE test strip CHECK BLOOD SUGAR 3 TIMES A DAY 03/30/23  Yes Raman Featherston, [name], MD  atorvastatin (LIPITOR) 40 MG tablet TAKE 1 TABLET BY MOUTH EVERY DAY Patient taking differently: Take 40 mg by mouth daily. 03/30/23   Yes Bran Aldridge, [name], MD  Blood Glucose Monitoring Suppl (ACCU-CHEK GUIDE) w/Device KIT 1 each by Does not apply route 3 (three) times daily. Check blood sugar 3 times a day 06/19/23  Yes Arrien, York Ram, MD  Cholecalciferol (VITAMIN D3) 25 MCG (1000 UT) CAPS Take 1 capsule by mouth daily.   Yes [provider]  Continuous Glucose Receiver (DEXCOM G7 RECEIVER) DEVI To check blood sugars twice a day 06/23/23  Yes Bobbijo Holst, [name], MD  Continuous Glucose Sensor (DEXCOM G7 SENSOR) MISC Use to check blood sugars twice a day 06/23/23  Yes Knolan Simien, [name], MD  cyclobenzaprine (FLEXERIL) 10 MG tablet TAKE 1 TABLET BY MOUTH EVERYDAY AT BEDTIME 04/23/23  Yes Corwin Levins, MD  diclofenac Sodium (VOLTAREN) 1 % GEL Apply 2 g topically 4 (four) times daily. 01/06/22  Yes Adron Bene, MD  DILT-XR 240 MG 24 hr capsule Take 1 capsule (240 mg total) by mouth daily. 12/29/22  Yes Mafalda Mcginniss, [name], MD  furosemide (LASIX) 40 MG tablet TAKE 1 TABLET BY MOUTH THREE TIMES PER WEEK 04/27/23  Yes  Georgina Quint, MD  insulin glargine (LANTUS) 100 UNIT/ML injection Inject 0.1 mLs (10 Units total) into the skin daily. 06/19/23 07/19/23 Yes Arrien, York Ram, MD  insulin lispro (HUMALOG) 100 UNIT/ML KwikPen For sugar 120 to 150 use one unit, for 151 to 200 use two units, for 201 to 250 use three units, for 251 to 300 use five units, for 301 to 350 use seven units, for 351 to 400 use 9 units, if sugar more than 400 call your primary care provider. 06/19/23  Yes Arrien, York Ram, MD  Insulin Pen Needle (PEN NEEDLES) 31G X 5 MM MISC 1 each by Does not apply route 3 (three) times daily with meals. May substitute to any manufacturer covered by patient's insurance. 06/19/23  Yes Arrien, York Ram, MD  Insulin Syringe-Needle U-100 31G X 15/64" 0.5 ML MISC Use to inject insulin one time a day 06/19/23  Yes Arrien, York Ram, MD  Insulin Syringes, Disposable, U-100 1 ML MISC 2  Syringes by Does not apply route daily. Use to inject insulin into the skin 1 time daily. diag code E11.9. Insulin dependent 06/19/23  Yes Arrien, York Ram, MD  omeprazole (PRILOSEC) 40 MG capsule TAKE 1 CAPSULE BY MOUTH EVERY DAY Patient taking differently: Take 40 mg by mouth daily. 03/30/23  Yes Kasumi Ditullio, [name], MD  senna (SENOKOT) 8.6 MG tablet Take 1 tablet (8.6 mg total) by mouth as needed for constipation. 07/08/20  Yes Merrilyn Puma, MD    Allergies  Allergen Reactions   Ace Inhibitors Other (See Comments) and Cough    Persistent dry cough   Amitriptyline Hcl Nausea And Vomiting and Other (See Comments)    GI Intolerance/Upset stomach   Aspirin Other (See Comments)    GI Intolerance   Latex Itching   Prednisone Nausea And Vomiting    Patient Active Problem List   Diagnosis Date Noted   Chronic diastolic CHF (congestive heart failure) (HCC) 06/18/2023   Class 1 obesity 06/18/2023   Seizure (HCC) 06/17/2023   Hyperosmolar hyperglycemic state (HHS) (HCC) 06/17/2023   Dyspnea on exertion 05/19/2023   Uncontrolled type 2 diabetes mellitus with hyperglycemia (HCC) 05/19/2023   Malnutrition of moderate degree 04/15/2023   Hyperglycemia 04/13/2023   CKD (chronic kidney disease) stage 5, GFR less than 15 ml/min (HCC) 03/09/2023   Bilateral hearing loss due to cerumen impaction 01/14/2023   Stage 4 chronic kidney disease (HCC) 02/09/2022   Type 2 diabetes mellitus with hyperlipidemia (HCC) 04/20/2019   Vaginal atrophy 07/22/2018   Morbid obesity (HCC) 08/04/2017   GERD (gastroesophageal reflux disease) 01/16/2014   Carpal tunnel syndrome, bilateral    Hypertension associated with diabetes (HCC) 11/09/2006   Controlled diabetes mellitus with retinopathy (HCC) 12/07/2002    Past Medical History:  Diagnosis Date   Carpal tunnel syndrome, bilateral    confirmed on nerve conduction studies   Chronic hepatitis C (HCC)    considered cured 06/2016 post retroviral therapy     Diabetes mellitus type 2, uncontrolled    Diabetic peripheral neuropathy (HCC)    GERD (gastroesophageal reflux disease)    History of endometriosis    History of Helicobacter infection 11/07   Hyperlipidemia    Liver fibrosis 02/17/2016   F2/3     Past Surgical History:  Procedure Laterality Date   ABDOMINAL HYSTERECTOMY     CARPAL TUNNEL RELEASE      Social History   Socioeconomic History   Marital status: Single    Spouse name: Not on file  Number of children: 0   Years of education: 12   Highest education level: Not on file  Occupational History   Occupation: CNA    Employer: [employer]   Occupation: Disabled  Tobacco Use   Smoking status: Never   Smokeless tobacco: Never  Vaping Use   Vaping status: Never Used  Substance and Sexual Activity   Alcohol use: No    Alcohol/week: 0.0 standard drinks of alcohol   Drug use: No   Sexual activity: Yes    Birth control/protection: None    Comment: 1 committed partner  Other Topics Concern   Not on file  Social History Narrative   Current Social History 08/28/2019        Patient lives alone in a 7th floor apartment with elevator. There are not steps up to the entrance the patient uses.       Patient's method of transportation is via family member (mom or friend).      The highest level of education was high school diploma.      The patient currently disabled.      Identified important Relationships are "My mother"       Pets : None       Interests / Fun: "TV, movies"       Current Stressors: "People, my health"       Religious / Personal Beliefs: "Baptist"       L. Leward Quan, RN, BSN       Social Determinants of Health   Financial Resource Strain: Low Risk  (04/24/2022)   Overall Financial Resource Strain (CARDIA)    Difficulty of Paying Living Expenses: Not hard at all  Food Insecurity: No Food Insecurity (06/22/2023)   Hunger Vital Sign    Worried About Running Out of Food in the Last Year: Never  true    Ran Out of Food in the Last Year: Never true  Transportation Needs: No Transportation Needs (06/22/2023)   PRAPARE - Administrator, Civil Service (Medical): No    Lack of Transportation (Non-Medical): No  Physical Activity: Not on file  Stress: No Stress Concern Present (04/24/2022)   [employer] of Occupational Health - Occupational Stress Questionnaire    Feeling of Stress : Not at all  Social Connections: Socially Isolated (04/24/2022)   Social Connection and Isolation Panel [NHANES]    Frequency of Communication with Friends and Family: Three times a week    Frequency of Social Gatherings with Friends and Family: Three times a week    Attends Religious Services: Never    Active Member of Clubs or Organizations: No    Attends Banker Meetings: Never    Marital Status: Divorced  Catering manager Violence: Not At Risk (04/24/2022)   Humiliation, Afraid, Rape, and Kick questionnaire    Fear of Current or Ex-Partner: No    Emotionally Abused: No    Physically Abused: No    Sexually Abused: No    Family History  Problem Relation Age of Onset   Cancer Mother        lung   Kidney disease Mother    Lung cancer Maternal Uncle    Breast cancer Maternal Grandmother    Liver cancer Maternal Grandfather    Heart disease Brother    Depression Brother    Diabetes Father    Dementia Father    Colon cancer Neg Hx      Review of Systems  Constitutional: Negative.  Negative for chills  and fever.  HENT: Negative.  Negative for congestion and sore throat.   Respiratory: Negative.  Negative for cough and shortness of breath.   Cardiovascular: Negative.  Negative for chest pain and palpitations.  Gastrointestinal: Negative.  Negative for abdominal pain, nausea and vomiting.  Genitourinary: Negative.  Negative for dysuria and hematuria.  Skin: Negative.  Negative for rash.  Neurological: Negative.  Negative for dizziness and headaches.  All other  systems reviewed and are negative.   Today's Vitals   06/29/23 1053 06/29/23 1135  BP: (!) 178/108 (!) 180/110  Pulse: 86   Temp: 97.7 F (36.5 C)   TempSrc: Oral   SpO2: 98%   Weight: 156 lb 8 oz (71 kg)   Height: 4\' 9"  (1.448 m)    Body mass index is 33.87 kg/m.   Physical Exam Vitals reviewed.  Constitutional:      Appearance: Normal appearance.  HENT:     Head: Normocephalic.     Mouth/Throat:     Mouth: Mucous membranes are moist.     Pharynx: Oropharynx is clear.  Eyes:     Extraocular Movements: Extraocular movements intact.     Pupils: Pupils are equal, round, and reactive to light.  Cardiovascular:     Rate and Rhythm: Normal rate and regular rhythm.     Pulses: Normal pulses.     Heart sounds: Normal heart sounds.  Pulmonary:     Effort: Pulmonary effort is normal.     Breath sounds: Normal breath sounds.  Musculoskeletal:     Cervical back: No tenderness.  Lymphadenopathy:     Cervical: No cervical adenopathy.  Skin:    General: Skin is warm and dry.     Capillary Refill: Capillary refill takes less than 2 seconds.  Neurological:     General: No focal deficit present.     Mental Status: She is alert and oriented to person, place, and time.  Psychiatric:        Mood and Affect: Mood normal.        Behavior: Behavior normal.      ASSESSMENT & PLAN: A total of 48 minutes was spent with the patient and counseling/coordination of care regarding preparing for this visit, review most recent office visit notes, review of most recent hospital discharge summary notes, review of multiple chronic medical conditions and their management, review of all medications and changes made, cardiovascular risks associated with uncontrolled hypertension, review of most recent blood work results, education on nutrition, ED precautions, prognosis, documentation, need for follow-up.  Problem List Items Addressed This Visit       Cardiovascular and Mediastinum    Hypertension associated with diabetes (HCC) - Primary    Elevated blood pressure reading in the office.  Has been off her medication for couple days. Normally takes Cardizem 240 mg daily and has been on this medication for a long while. Well-controlled diabetes on insulin alone. Presently taking 10 units of basal insulin, insulin glargine daily and Premeal insulin lispro as per sliding scale No other medications at present time Diet and nutrition discussed Hypoglycemia instructions given ED precautions given      Relevant Medications   DILT-XR 240 MG 24 hr capsule     Digestive   GERD (gastroesophageal reflux disease) (Chronic)    Stable and asymptomatic Taking omeprazole 40 mg daily        Endocrine   Type 2 diabetes mellitus with hyperlipidemia (HCC)    Chronic stable conditions Continue atorvastatin 40 mg daily  Relevant Medications   DILT-XR 240 MG 24 hr capsule     Genitourinary   CKD (chronic kidney disease) stage 5, GFR less than 15 ml/min (HCC)    Not on dialysis yet On kidney transplant list Follows up with nephrologist on a regular basis      Other Visit Diagnoses     Hospital discharge follow-up          Patient Instructions  Hypertension, Adult High blood pressure (hypertension) is when the force of blood pumping through the arteries is too strong. The arteries are the blood vessels that carry blood from the heart throughout the body. Hypertension forces the heart to work harder to pump blood and may cause arteries to become narrow or stiff. Untreated or uncontrolled hypertension can lead to a heart attack, heart failure, a stroke, kidney disease, and other problems. A blood pressure reading consists of a higher number over a lower number. Ideally, your blood pressure should be below 120/80. The first ("top") number is called the systolic pressure. It is a measure of the pressure in your arteries as your heart beats. The second ("bottom") number is  called the diastolic pressure. It is a measure of the pressure in your arteries as the heart relaxes. What are the causes? The exact cause of this condition is not known. There are some conditions that result in high blood pressure. What increases the risk? Certain factors may make you more likely to develop high blood pressure. Some of these risk factors are under your control, including: Smoking. Not getting enough exercise or physical activity. Being overweight. Having too much fat, sugar, calories, or salt (sodium) in your diet. Drinking too much alcohol. Other risk factors include: Having a personal history of heart disease, diabetes, high cholesterol, or kidney disease. Stress. Having a family history of high blood pressure and high cholesterol. Having obstructive sleep apnea. Age. The risk increases with age. What are the signs or symptoms? High blood pressure may not cause symptoms. Very high blood pressure (hypertensive crisis) may cause: Headache. Fast or irregular heartbeats (palpitations). Shortness of breath. Nosebleed. Nausea and vomiting. Vision changes. Severe chest pain, dizziness, and seizures. How is this diagnosed? This condition is diagnosed by measuring your blood pressure while you are seated, with your arm resting on a flat surface, your legs uncrossed, and your feet flat on the floor. The cuff of the blood pressure monitor will be placed directly against the skin of your upper arm at the level of your heart. Blood pressure should be measured at least twice using the same arm. Certain conditions can cause a difference in blood pressure between your right and left arms. If you have a high blood pressure reading during one visit or you have normal blood pressure with other risk factors, you may be asked to: Return on a different day to have your blood pressure checked again. Monitor your blood pressure at home for 1 week or longer. If you are diagnosed with  hypertension, you may have other blood or imaging tests to help your health care provider understand your overall risk for other conditions. How is this treated? This condition is treated by making healthy lifestyle changes, such as eating healthy foods, exercising more, and reducing your alcohol intake. You may be referred for counseling on a healthy diet and physical activity. Your health care provider may prescribe medicine if lifestyle changes are not enough to get your blood pressure under control and if: Your systolic blood pressure is above  130. Your diastolic blood pressure is above 80. Your personal target blood pressure may vary depending on your medical conditions, your age, and other factors. Follow these instructions at home: Eating and drinking  Eat a diet that is high in fiber and potassium, and low in sodium, added sugar, and fat. An example of this eating plan is called the DASH diet. DASH stands for Dietary Approaches to Stop Hypertension. To eat this way: Eat plenty of fresh fruits and vegetables. Try to fill one half of your plate at each meal with fruits and vegetables. Eat whole grains, such as whole-wheat pasta, brown rice, or whole-grain bread. Fill about one fourth of your plate with whole grains. Eat or drink low-fat dairy products, such as skim milk or low-fat yogurt. Avoid fatty cuts of meat, processed or cured meats, and poultry with skin. Fill about one fourth of your plate with lean proteins, such as fish, chicken without skin, beans, eggs, or tofu. Avoid pre-made and processed foods. These tend to be higher in sodium, added sugar, and fat. Reduce your daily sodium intake. Many people with hypertension should eat less than 1,500 mg of sodium a day. Do not drink alcohol if: Your health care provider tells you not to drink. You are pregnant, may be pregnant, or are planning to become pregnant. If you drink alcohol: Limit how much you have to: 0-1 drink a day for  women. 0-2 drinks a day for men. Know how much alcohol is in your drink. In the [place] one drink equals one 12 oz bottle of beer (355 mL), one 5 oz glass of wine (148 mL), or one 1 oz glass of hard liquor (44 mL). Lifestyle  Work with your health care provider to maintain a healthy body weight or to lose weight. Ask what an ideal weight is for you. Get at least 30 minutes of exercise that causes your heart to beat faster (aerobic exercise) most days of the week. Activities may include walking, swimming, or biking. Include exercise to strengthen your muscles (resistance exercise), such as Pilates or lifting weights, as part of your weekly exercise routine. Try to do these types of exercises for 30 minutes at least 3 days a week. Do not use any products that contain nicotine or tobacco. These products include cigarettes, chewing tobacco, and vaping devices, such as e-cigarettes. If you need help quitting, ask your health care provider. Monitor your blood pressure at home as told by your health care provider. Keep all follow-up visits. This is important. Medicines Take over-the-counter and prescription medicines only as told by your health care provider. Follow directions carefully. Blood pressure medicines must be taken as prescribed. Do not skip doses of blood pressure medicine. Doing this puts you at risk for problems and can make the medicine less effective. Ask your health care provider about side effects or reactions to medicines that you should watch for. Contact a health care provider if you: Think you are having a reaction to a medicine you are taking. Have headaches that keep coming back (recurring). Feel dizzy. Have swelling in your ankles. Have trouble with your vision. Get help right away if you: Develop a severe headache or confusion. Have unusual weakness or numbness. Feel faint. Have severe pain in your chest or abdomen. Vomit repeatedly. Have trouble breathing. These  symptoms may be an emergency. Get help right away. Call 911. Do not wait to see if the symptoms will go away. Do not drive yourself to the hospital. Summary Hypertension  is when the force of blood pumping through your arteries is too strong. If this condition is not controlled, it may put you at risk for serious complications. Your personal target blood pressure may vary depending on your medical conditions, your age, and other factors. For most people, a normal blood pressure is less than 120/80. Hypertension is treated with lifestyle changes, medicines, or a combination of both. Lifestyle changes include losing weight, eating a healthy, low-sodium diet, exercising more, and limiting alcohol. This information is not intended to replace advice given to you by your health care provider. Make sure you discuss any questions you have with your health care provider. Document Revised: 09/30/2021 Document Reviewed: 09/30/2021 Elsevier Patient Education  2024 Elsevier Inc.    Edwina Barth, MD [facility]

## 2023-06-29 NOTE — Assessment & Plan Note (Signed)
Chronic stable conditions Continue atorvastatin 40 mg daily

## 2023-06-29 NOTE — Assessment & Plan Note (Signed)
Stable and asymptomatic Taking omeprazole 40 mg daily

## 2023-06-29 NOTE — Patient Instructions (Signed)
Hypertension, Adult High blood pressure (hypertension) is when the force of blood pumping through the arteries is too strong. The arteries are the blood vessels that carry blood from the heart throughout the body. Hypertension forces the heart to work harder to pump blood and may cause arteries to become narrow or stiff. Untreated or uncontrolled hypertension can lead to a heart attack, heart failure, a stroke, kidney disease, and other problems. A blood pressure reading consists of a higher number over a lower number. Ideally, your blood pressure should be below 120/80. The first ("top") number is called the systolic pressure. It is a measure of the pressure in your arteries as your heart beats. The second ("bottom") number is called the diastolic pressure. It is a measure of the pressure in your arteries as the heart relaxes. What are the causes? The exact cause of this condition is not known. There are some conditions that result in high blood pressure. What increases the risk? Certain factors may make you more likely to develop high blood pressure. Some of these risk factors are under your control, including: Smoking. Not getting enough exercise or physical activity. Being overweight. Having too much fat, sugar, calories, or salt (sodium) in your diet. Drinking too much alcohol. Other risk factors include: Having a personal history of heart disease, diabetes, high cholesterol, or kidney disease. Stress. Having a family history of high blood pressure and high cholesterol. Having obstructive sleep apnea. Age. The risk increases with age. What are the signs or symptoms? High blood pressure may not cause symptoms. Very high blood pressure (hypertensive crisis) may cause: Headache. Fast or irregular heartbeats (palpitations). Shortness of breath. Nosebleed. Nausea and vomiting. Vision changes. Severe chest pain, dizziness, and seizures. How is this diagnosed? This condition is diagnosed by  measuring your blood pressure while you are seated, with your arm resting on a flat surface, your legs uncrossed, and your feet flat on the floor. The cuff of the blood pressure monitor will be placed directly against the skin of your upper arm at the level of your heart. Blood pressure should be measured at least twice using the same arm. Certain conditions can cause a difference in blood pressure between your right and left arms. If you have a high blood pressure reading during one visit or you have normal blood pressure with other risk factors, you may be asked to: Return on a different day to have your blood pressure checked again. Monitor your blood pressure at home for 1 week or longer. If you are diagnosed with hypertension, you may have other blood or imaging tests to help your health care provider understand your overall risk for other conditions. How is this treated? This condition is treated by making healthy lifestyle changes, such as eating healthy foods, exercising more, and reducing your alcohol intake. You may be referred for counseling on a healthy diet and physical activity. Your health care provider may prescribe medicine if lifestyle changes are not enough to get your blood pressure under control and if: Your systolic blood pressure is above 130. Your diastolic blood pressure is above 80. Your personal target blood pressure may vary depending on your medical conditions, your age, and other factors. Follow these instructions at home: Eating and drinking  Eat a diet that is high in fiber and potassium, and low in sodium, added sugar, and fat. An example of this eating plan is called the DASH diet. DASH stands for Dietary Approaches to Stop Hypertension. To eat this way: Eat   plenty of fresh fruits and vegetables. Try to fill one half of your plate at each meal with fruits and vegetables. Eat whole grains, such as whole-wheat pasta, brown rice, or whole-grain bread. Fill about one  fourth of your plate with whole grains. Eat or drink low-fat dairy products, such as skim milk or low-fat yogurt. Avoid fatty cuts of meat, processed or cured meats, and poultry with skin. Fill about one fourth of your plate with lean proteins, such as fish, chicken without skin, beans, eggs, or tofu. Avoid pre-made and processed foods. These tend to be higher in sodium, added sugar, and fat. Reduce your daily sodium intake. Many people with hypertension should eat less than 1,500 mg of sodium a day. Do not drink alcohol if: Your health care provider tells you not to drink. You are pregnant, may be pregnant, or are planning to become pregnant. If you drink alcohol: Limit how much you have to: 0-1 drink a day for women. 0-2 drinks a day for men. Know how much alcohol is in your drink. In the [place] one drink equals one 12 oz bottle of beer (355 mL), one 5 oz glass of wine (148 mL), or one 1 oz glass of hard liquor (44 mL). Lifestyle  Work with your health care provider to maintain a healthy body weight or to lose weight. Ask what an ideal weight is for you. Get at least 30 minutes of exercise that causes your heart to beat faster (aerobic exercise) most days of the week. Activities may include walking, swimming, or biking. Include exercise to strengthen your muscles (resistance exercise), such as Pilates or lifting weights, as part of your weekly exercise routine. Try to do these types of exercises for 30 minutes at least 3 days a week. Do not use any products that contain nicotine or tobacco. These products include cigarettes, chewing tobacco, and vaping devices, such as e-cigarettes. If you need help quitting, ask your health care provider. Monitor your blood pressure at home as told by your health care provider. Keep all follow-up visits. This is important. Medicines Take over-the-counter and prescription medicines only as told by your health care provider. Follow directions carefully. Blood  pressure medicines must be taken as prescribed. Do not skip doses of blood pressure medicine. Doing this puts you at risk for problems and can make the medicine less effective. Ask your health care provider about side effects or reactions to medicines that you should watch for. Contact a health care provider if you: Think you are having a reaction to a medicine you are taking. Have headaches that keep coming back (recurring). Feel dizzy. Have swelling in your ankles. Have trouble with your vision. Get help right away if you: Develop a severe headache or confusion. Have unusual weakness or numbness. Feel faint. Have severe pain in your chest or abdomen. Vomit repeatedly. Have trouble breathing. These symptoms may be an emergency. Get help right away. Call 911. Do not wait to see if the symptoms will go away. Do not drive yourself to the hospital. Summary Hypertension is when the force of blood pumping through your arteries is too strong. If this condition is not controlled, it may put you at risk for serious complications. Your personal target blood pressure may vary depending on your medical conditions, your age, and other factors. For most people, a normal blood pressure is less than 120/80. Hypertension is treated with lifestyle changes, medicines, or a combination of both. Lifestyle changes include losing weight, eating a healthy,   low-sodium diet, exercising more, and limiting alcohol. This information is not intended to replace advice given to you by your health care provider. Make sure you discuss any questions you have with your health care provider. Document Revised: 09/30/2021 Document Reviewed: 09/30/2021 Elsevier Patient Education  2024 Elsevier Inc.  

## 2023-06-30 ENCOUNTER — Ambulatory Visit: Payer: Self-pay

## 2023-06-30 ENCOUNTER — Encounter: Payer: Self-pay | Admitting: Internal Medicine

## 2023-06-30 ENCOUNTER — Ambulatory Visit: Payer: 59 | Attending: Internal Medicine | Admitting: Internal Medicine

## 2023-06-30 VITALS — BP 144/90 | HR 81 | Ht <= 58 in | Wt 157.0 lb

## 2023-06-30 DIAGNOSIS — E1169 Type 2 diabetes mellitus with other specified complication: Secondary | ICD-10-CM | POA: Diagnosis not present

## 2023-06-30 DIAGNOSIS — E1159 Type 2 diabetes mellitus with other circulatory complications: Secondary | ICD-10-CM

## 2023-06-30 DIAGNOSIS — N185 Chronic kidney disease, stage 5: Secondary | ICD-10-CM

## 2023-06-30 DIAGNOSIS — I1311 Hypertensive heart and chronic kidney disease without heart failure, with stage 5 chronic kidney disease, or end stage renal disease: Secondary | ICD-10-CM | POA: Diagnosis not present

## 2023-06-30 DIAGNOSIS — E785 Hyperlipidemia, unspecified: Secondary | ICD-10-CM

## 2023-06-30 DIAGNOSIS — I152 Hypertension secondary to endocrine disorders: Secondary | ICD-10-CM | POA: Diagnosis not present

## 2023-06-30 MED ORDER — DILTIAZEM HCL ER COATED BEADS 360 MG PO CP24: 360.0000 mg | ORAL_CAPSULE | Freq: Every day | ORAL | 3 refills | Status: DC

## 2023-06-30 NOTE — Patient Outreach (Signed)
  Care Coordination   Initial Visit Note   06/30/2023 Name: Nancy [name] MRN: [id number] DOB: [birth year]  Nancy [name] is a 67 y.o. year old female who sees [name], Nancy Kempf, MD for primary care. I spoke with  Nancy [name] by phone today.  What matters to the patients health and wellness today?  Admitted 06/17/23-06/19/23 with hyperosmolar hyperglycemic state. H/o HTN, DM, CKD stage 4-5. Patient reports saw cardiologist today. She states Cardizem increased to 360 mg daily. She is awaiting it to be filled by pharmacy. PCP visit completed 06/29/23. She states she does not have BP monitor.  Goals Addressed             This Visit's Progress    Assist with health management       Interventions Today    Flowsheet Row Most Recent Value  Chronic Disease   Chronic disease during today's visit Hypertension (HTN), Diabetes, Chronic Kidney Disease/End Stage Renal Disease (ESRD)  General Interventions   General Interventions Discussed/Reviewed General Interventions Discussed, Doctor Visits  Doctor Visits Discussed/Reviewed Doctor Visits Discussed, PCP  Durable Medical Equipment (DME) Other  [CGM-advised to contact provider to arrange appointment to show her how to use]  PCP/Specialist Visits Compliance with follow-up visit  [reviewed upcoming appointments]  Education Interventions   Education Provided Provided Education  [advised to contact insurance provider re: OTC catalog and BP monitor. advised to obtain BP monitor, begin to check and record and take to appointments.]  Provided Verbal Education On Medication, When to see the doctor  [encouraged to take medications as prescribed, reviewed instructions per cardiology visit today and PCP visit 06/28/33.]  Pharmacy Interventions   Pharmacy Dicussed/Reviewed Pharmacy Topics Discussed            SDOH assessments and interventions completed:  No  Care Coordination Interventions:  Yes, provided   Follow up plan: Follow up call  scheduled for 07/20/23    Encounter Outcome:  Pt. Visit Completed   Nancy Sheriff, RN, MSN, BSN, CCM [facility] Care Coordinator [phone]

## 2023-06-30 NOTE — Progress Notes (Signed)
Cardiology Office Note:    Date:  06/30/2023   ID:  Nancy [name], DOB [birth year], MRN [id number]  PCP:  Georgina Quint, MD   [facility] Providers Cardiologist:  Christell Constant, MD     Referring MD: Georgina Quint, *   CC: Told to come Consulted for the evaluation of Chest pain at the behest of Dr. Alvy Bimler   History of Present Illness:    Nancy [name] is a 67 y.o. female with a hx of HTN with DM, CKD Stage 4-5, Obesity.  Has Carpal Tunnel.  Discharged from recent [facility] 06/17/23.  At that visit LVEF was 70%, difficult hypertrophy assessment.  LVOT gradient of 9 mm Hg evaluted by Dr. Algie Coffer.  Patient notes that she is feeling better after her hospitalization.   She had to ED visit- first was not sure seizures She notes that she had elevated blood pressure for many years (since the diagnosis of her elevated BP) and has never had it controlled.  Has had no chest pain, chest pressure, chest tightness, chest stinging .  No shortness of breath, DOE .  No PND or orthopnea.  No weight gain, leg swelling , or abdominal swelling.  No near syncope . Notes  no palpitations or funny heart beats.    She had a seizure and is seeing neurology  No family history of HCM. Brother is decreased from CHF NOS.  No SCD.      Past Medical History:  Diagnosis Date   Carpal tunnel syndrome, bilateral    confirmed on nerve conduction studies   Chronic hepatitis C (HCC)    considered cured 06/2016 post retroviral therapy    Diabetes mellitus type 2, uncontrolled    Diabetic peripheral neuropathy (HCC)    GERD (gastroesophageal reflux disease)    History of endometriosis    History of Helicobacter infection 11/07   Hyperlipidemia    Liver fibrosis 02/17/2016   F2/3     Past Surgical History:  Procedure Laterality Date   ABDOMINAL HYSTERECTOMY     CARPAL TUNNEL RELEASE      Current Medications: Current Meds  Medication Sig   ACCU-CHEK FASTCLIX LANCETS  MISC Use to check blood sugar up to 3 times a day   ACCU-CHEK GUIDE test strip CHECK BLOOD SUGAR 3 TIMES A DAY   atorvastatin (LIPITOR) 40 MG tablet TAKE 1 TABLET BY MOUTH EVERY DAY   Blood Glucose Monitoring Suppl (ACCU-CHEK GUIDE) w/Device KIT 1 each by Does not apply route 3 (three) times daily. Check blood sugar 3 times a day   Cholecalciferol (VITAMIN D3) 25 MCG (1000 UT) CAPS Take 1 capsule by mouth daily.   Continuous Glucose Receiver (DEXCOM G7 RECEIVER) DEVI To check blood sugars twice a day   Continuous Glucose Sensor (DEXCOM G7 SENSOR) MISC Use to check blood sugars twice a day   cyclobenzaprine (FLEXERIL) 10 MG tablet TAKE 1 TABLET BY MOUTH EVERYDAY AT BEDTIME   diclofenac Sodium (VOLTAREN) 1 % GEL Apply 2 g topically 4 (four) times daily.   diltiazem (CARDIZEM CD) 360 MG 24 hr capsule Take 1 capsule (360 mg total) by mouth daily.   furosemide (LASIX) 40 MG tablet TAKE 1 TABLET BY MOUTH THREE TIMES PER WEEK   insulin glargine (LANTUS) 100 UNIT/ML injection Inject 0.1 mLs (10 Units total) into the skin daily.   insulin lispro (HUMALOG) 100 UNIT/ML KwikPen For sugar 120 to 150 use one unit, for 151 to 200 use two units, for 201 to  250 use three units, for 251 to 300 use five units, for 301 to 350 use seven units, for 351 to 400 use 9 units, if sugar more than 400 call your primary care provider.   Insulin Pen Needle (PEN NEEDLES) 31G X 5 MM MISC 1 each by Does not apply route 3 (three) times daily with meals. May substitute to any manufacturer covered by patient's insurance.   Insulin Syringe-Needle U-100 31G X 15/64" 0.5 ML MISC Use to inject insulin one time a day   Insulin Syringes, Disposable, U-100 1 ML MISC 2 Syringes by Does not apply route daily. Use to inject insulin into the skin 1 time daily. diag code E11.9. Insulin dependent   omeprazole (PRILOSEC) 40 MG capsule TAKE 1 CAPSULE BY MOUTH EVERY DAY   senna (SENOKOT) 8.6 MG tablet Take 1 tablet (8.6 mg total) by mouth as needed  for constipation.   [DISCONTINUED] DILT-XR 240 MG 24 hr capsule Take 1 capsule (240 mg total) by mouth daily.     Allergies:   Ace inhibitors, Amitriptyline hcl, Aspirin, Latex, and Prednisone   Social History   Socioeconomic History   Marital status: Single    Spouse name: Not on file   Number of children: 0   Years of education: 12   Highest education level: Not on file  Occupational History   Occupation: CNA    Employer: [employer]   Occupation: Disabled  Tobacco Use   Smoking status: Never   Smokeless tobacco: Never  Vaping Use   Vaping status: Never Used  Substance and Sexual Activity   Alcohol use: No    Alcohol/week: 0.0 standard drinks of alcohol   Drug use: No   Sexual activity: Yes    Birth control/protection: None    Comment: 1 committed partner  Other Topics Concern   Not on file  Social History Narrative   Current Social History 08/28/2019        Patient lives alone in a 7th floor apartment with elevator. There are not steps up to the entrance the patient uses.       Patient's method of transportation is via family member (mom or friend).      The highest level of education was high school diploma.      The patient currently disabled.      Identified important Relationships are "My mother"       Pets : None       Interests / Fun: "TV, movies"       Current Stressors: "People, my health"       Religious / Personal Beliefs: "Baptist"       L. Leward Quan, RN, BSN       Social Determinants of Health   Financial Resource Strain: Low Risk  (04/24/2022)   Overall Financial Resource Strain (CARDIA)    Difficulty of Paying Living Expenses: Not hard at all  Food Insecurity: No Food Insecurity (06/22/2023)   Hunger Vital Sign    Worried About Running Out of Food in the Last Year: Never true    Ran Out of Food in the Last Year: Never true  Transportation Needs: No Transportation Needs (06/22/2023)   PRAPARE - Administrator, Civil Service  (Medical): No    Lack of Transportation (Non-Medical): No  Physical Activity: Not on file  Stress: No Stress Concern Present (04/24/2022)   [employer] of Occupational Health - Occupational Stress Questionnaire    Feeling of Stress : Not at all  Social Connections: Socially Isolated (04/24/2022)   Social Connection and Isolation Panel [NHANES]    Frequency of Communication with Friends and Family: Three times a week    Frequency of Social Gatherings with Friends and Family: Three times a week    Attends Religious Services: Never    Active Member of Clubs or Organizations: No    Attends Engineer, structural: Never    Marital Status: Divorced     Family History: The patient's family history includes Breast cancer in her maternal grandmother; Cancer in her mother; Dementia in her father; Depression in her brother; Diabetes in her father; Heart disease in her brother; Kidney disease in her mother; Liver cancer in her maternal grandfather; Lung cancer in her maternal uncle. There is no history of Colon cancer.  ROS:   Please see the history of present illness.     EKGs/Labs/Other Studies Reviewed:    The following studies were reviewed today:      Recent Labs: 06/17/2023: ALT 14 06/18/2023: Hemoglobin 10.3; Platelets 239 06/19/2023: BUN 37; Creatinine, Ser 6.03; Magnesium 1.8; Potassium 3.7; Sodium 135  Recent Lipid Panel    Component Value Date/Time   CHOL 161 05/14/2022 1121   CHOL 140 09/12/2019 1035   TRIG 71.0 05/14/2022 1121   HDL 57.00 05/14/2022 1121   HDL 51 09/12/2019 1035   CHOLHDL 3 05/14/2022 1121   VLDL 14.2 05/14/2022 1121   LDLCALC 90 05/14/2022 1121   LDLCALC 67 09/12/2019 1035   Physical Exam:    VS:  BP (!) 144/90   Pulse 81   Ht 4\' 9"  (1.448 m)   Wt 157 lb (71.2 kg)   LMP 02/15/2009   SpO2 98%   BMI 33.97 kg/m     Wt Readings from Last 3 Encounters:  06/30/23 157 lb (71.2 kg)  06/29/23 156 lb 8 oz (71 kg)  06/19/23 161 lb 2.5 oz (73.1  kg)     GEN:  Well nourished, well developed in no acute distress HEENT: Normal NECK: No JVD CARDIAC: RRR, systolic murmur with standing, rubs, gallops RESPIRATORY:  Clear to auscultation without rales, wheezing or rhonchi  ABDOMEN: Soft, non-tender, non-distended MUSCULOSKELETAL:  No edema; No deformity  SKIN: Warm and dry NEUROLOGIC:  Alert and oriented x 3 PSYCHIATRIC:  Normal affect   ASSESSMENT:    1. Hypertensive heart and chronic kidney disease stage 5 (HCC)   2. Hyperlipidemia associated with type 2 diabetes mellitus (HCC)   3. Hypertension associated with diabetes (HCC)    PLAN:    HTN with DM CKD stage 5 Query of HCM With hx of Carpal Tunnel- suspect HTN and DM lead to CKD - will see if we are able to get a BP cuff for her (addendum- we do not have any available) - will increase to diltazem 360 mg PO XL - will schedule pharm D visit for HTN; I do not suspect oHCM given minimal LVOT gradient and chronic uncontrolled BP; given kidney I do not plan on CMR at this time - continue current diuretic; she has nephrology appt pending  HLD - continue current statin; LDL < 100  Seizures - pending neuro assessment  Six months with me or APP         Medication Adjustments/Labs and Tests Ordered: Current medicines are reviewed at length with the patient today.  Concerns regarding medicines are outlined above.  Orders Placed This Encounter  Procedures   AMB Referral to [facility] Pharm-D   Meds ordered this encounter  Medications   diltiazem (CARDIZEM CD) 360 MG 24 hr capsule    Sig: Take 1 capsule (360 mg total) by mouth daily.    Dispense:  90 capsule    Refill:  3    Patient Instructions  Medication Instructions:  Your physician has recommended you make the following change in your medication:  INCREASE: diltiazem to 360 mg by mouth once daily Please monitor your BP  *If you need a refill on your cardiac medications before your next appointment, please call  your pharmacy*   Lab Work: NONE If you have labs (blood work) drawn today and your tests are completely normal, you will receive your results only by: MyChart Message (if you have MyChart) OR A paper copy in the mail If you have any lab test that is abnormal or we need to change your treatment, we will call you to review the results.   Testing/Procedures: Your physician has referred you to the Pharmacy Clinic for Hypertension.    Follow-Up: At [facility], you and your health needs are our priority.  As part of our continuing mission to provide you with exceptional heart care, we have created designated Provider Care Teams.  These Care Teams include your primary Cardiologist (physician) and Advanced Practice Providers (APPs -  Physician Assistants and Nurse Practitioners) who all work together to provide you with the care you need, when you need it.  We recommend signing up for the patient portal called "MyChart".  Sign up information is provided on this After Visit Summary.  MyChart is used to connect with patients for Virtual Visits (Telemedicine).  Patients are able to view lab/test results, encounter notes, upcoming appointments, etc.  Non-urgent messages can be sent to your provider as well.   To learn more about what you can do with MyChart, go to [email or web address].    Your next appointment:   6 month(s)  Provider:   Christell Constant, MD  or Jari Favre, PA-C, Ronie Spies, PA-C, Robin Searing, NP, Jacolyn Reedy, PA-C, Eligha Bridegroom, NP, or Tereso Newcomer, PA-C      Signed, Christell Constant, MD  06/30/2023 11:50 AM    [facility]

## 2023-06-30 NOTE — Patient Instructions (Addendum)
Medication Instructions:  Your physician has recommended you make the following change in your medication:  INCREASE: diltiazem to 360 mg by mouth once daily Please monitor your BP  *If you need a refill on your cardiac medications before your next appointment, please call your pharmacy*   Lab Work: NONE If you have labs (blood work) drawn today and your tests are completely normal, you will receive your results only by: MyChart Message (if you have MyChart) OR A paper copy in the mail If you have any lab test that is abnormal or we need to change your treatment, we will call you to review the results.   Testing/Procedures: Your physician has referred you to the Pharmacy Clinic for Hypertension.    Follow-Up: At [facility], you and your health needs are our priority.  As part of our continuing mission to provide you with exceptional heart care, we have created designated Provider Care Teams.  These Care Teams include your primary Cardiologist (physician) and Advanced Practice Providers (APPs -  Physician Assistants and Nurse Practitioners) who all work together to provide you with the care you need, when you need it.  We recommend signing up for the patient portal called "MyChart".  Sign up information is provided on this After Visit Summary.  MyChart is used to connect with patients for Virtual Visits (Telemedicine).  Patients are able to view lab/test results, encounter notes, upcoming appointments, etc.  Non-urgent messages can be sent to your provider as well.   To learn more about what you can do with MyChart, go to [email or web address].    Your next appointment:   6 month(s)  Provider:   Christell Constant, MD  or Jari Favre, PA-C, Ronie Spies, PA-C, Robin Searing, NP, Jacolyn Reedy, PA-C, Eligha Bridegroom, NP, or Tereso Newcomer, PA-C

## 2023-06-30 NOTE — Patient Instructions (Signed)
Visit Information  Thank you for taking time to visit with me today. Please don't hesitate to contact me if I can be of assistance to you.   Following are the goals we discussed today:   Goals Addressed             This Visit's Progress    Assist with health management       Interventions Today    Flowsheet Row Most Recent Value  Chronic Disease   Chronic disease during today's visit Hypertension (HTN), Diabetes, Chronic Kidney Disease/End Stage Renal Disease (ESRD)  General Interventions   General Interventions Discussed/Reviewed General Interventions Discussed, Doctor Visits  Doctor Visits Discussed/Reviewed Doctor Visits Discussed, PCP  Durable Medical Equipment (DME) Other  [CGM-advised to contact provider to arrange appointment to show her how to use]  PCP/Specialist Visits Compliance with follow-up visit  [reviewed upcoming appointments]  Education Interventions   Education Provided Provided Education  [advised to contact insurance provider re: OTC catalog and BP monitor. advised to obtain BP monitor, begin to check and record and take to appointments.]  Provided Verbal Education On Medication, When to see the doctor  [encouraged to take medications as prescribed, reviewed instructions per cardiology visit today and PCP visit 06/28/33.]  Pharmacy Interventions   Pharmacy Dicussed/Reviewed Pharmacy Topics Discussed            Our next appointment is by telephone on 07/20/23 at 9:30 am  Please call the care guide team at [phone] if you need to cancel or reschedule your appointment.   If you are experiencing a Mental Health or Behavioral Health Crisis or need someone to talk to, please call the Suicide and Crisis Lifeline: 11  [name], RN, MSN, BSN, CCM [facility] Care Coordinator [phone]

## 2023-07-12 ENCOUNTER — Other Ambulatory Visit: Payer: Self-pay | Admitting: Internal Medicine

## 2023-07-20 ENCOUNTER — Ambulatory Visit: Payer: Self-pay

## 2023-07-20 NOTE — Patient Instructions (Addendum)
Visit Information  Thank you for taking time to visit with me today. Please don't hesitate to contact me if I can be of assistance to you.   Following are the goals we discussed today:  Attend [facility] appointment with pharmacist on 07/26/23 at 10:30 am. Be there 15 minutes before appointment scheduled Contact Nephrology office to schedule an appointment Take medications as scheduled Schedule ophthalmology exam Obtain Blood pressure cuff and scales through [facility] insurance benefit. Contact your [facility] Navigator to assist you as needed [phone].   Goals Addressed             This Visit's Progress    Assist with health management       Interventions Today    Flowsheet Row Most Recent Value  Chronic Disease   Chronic disease during today's visit Diabetes, Hypertension (HTN), Chronic Kidney Disease/End Stage Renal Disease (ESRD), Congestive Heart Failure (CHF)  General Interventions   General Interventions Discussed/Reviewed General Interventions Reviewed, Doctor Visits, Communication with  Doctor Visits Discussed/Reviewed PCP, Doctor Visits Reviewed, Doctor Visits Discussed, Specialist  PCP/Specialist Visits Compliance with follow-up visit  [reviewed upcoming appointments, reiterated date/time appointment with heartcare pharmacist and encouraged to attend.]  Communication with PCP/Specialists  [update primary provider]  Education Interventions   Education Provided Provided Education  Provided Verbal Education On Insurance Plans, Blood Sugar Monitoring, Medication  [reinforced importance of checking BP and daily weights, reinforced importance of taking diuretics, discussed [facility] navigator and provided contact number [phone]. encouraged to use U card benefit as recommended for eligible benefits]  Pharmacy Interventions   Pharmacy Dicussed/Reviewed Pharmacy Topics Reviewed, Medication Adherence  [heart care pharmacy appointment 07/26/23]  Medication Adherence Not taking  medication  [reports has not been taking furosemide. RNCM encouraged to take as scheduled three times/week per AVS.]            Our next appointment is by telephone on 08/10/23 at 10:30 am  Please call the care guide team at [phone] if you need to cancel or reschedule your appointment.   If you are experiencing a Mental Health or Behavioral Health Crisis or need someone to talk to, please call the Suicide and Crisis Lifeline: 988 call the [place] National Suicide Prevention Lifeline: 978-269-2607 or TTY: 980-871-3353 TTY 279-199-3195) to talk to a trained counselor call 1-800-273-TALK (toll free, 24 hour hotline)  [name], RN, MSN, BSN, CCM [facility] Care Coordinator [phone]

## 2023-07-20 NOTE — Patient Outreach (Signed)
  Care Coordination   Follow Up Visit Note   07/20/2023 Name: Nancy [name] MRN: [id number] DOB: [birth year]  Nancy [name] is a 67 y.o. year old female who sees [name], [name], MD for primary care. I spoke with  Nancy [name] by phone today.  What matters to the patients health and wellness today?  Patient reports tires quickly and feet swelling. She states she has not been taking Furosemide, but states she will start back taking three times a week as prescribed. She reports she needs to call the nephrology office back to schedule an appointment because she has been busy caring for her mother. But also mentioned that she wanted to see a different nephrologist. I emphasized the importance of seeing a nephrologist. She is seeing a  [facility] pharmacist on 07/26/23.   Goals Addressed             This Visit's Progress    Assist with health management       Interventions Today    Flowsheet Row Most Recent Value  Chronic Disease   Chronic disease during today's visit Diabetes, Hypertension (HTN), Chronic Kidney Disease/End Stage Renal Disease (ESRD), Congestive Heart Failure (CHF)  General Interventions   General Interventions Discussed/Reviewed General Interventions Reviewed, Doctor Visits, Communication with  Doctor Visits Discussed/Reviewed PCP, Doctor Visits Reviewed, Doctor Visits Discussed, Specialist  PCP/Specialist Visits Compliance with follow-up visit  [reviewed upcoming appointments, reiterated date/time appointment with heartcare pharmacist and encouraged to attend.]  Communication with PCP/Specialists  [update primary provider]  Education Interventions   Education Provided Provided Education  Provided Verbal Education On Insurance Plans, Blood Sugar Monitoring, Medication  [reinforced importance of checking BP and daily weights, reinforced importance of taking diuretics, discussed [facility] navigator and provided contact number [phone]. encouraged to use U  card benefit as recommended for eligible benefits]  Pharmacy Interventions   Pharmacy Dicussed/Reviewed Pharmacy Topics Reviewed, Medication Adherence  [heart care pharmacy appointment 07/26/23]  Medication Adherence Not taking medication  [reports has not been taking furosemide. RNCM encouraged to take as scheduled three times/week per AVS.]            SDOH assessments and interventions completed:  No  Care Coordination Interventions:  Yes, provided   Follow up plan: Follow up call scheduled for 08/03/23    Encounter Outcome:  Pt. Visit Completed   Nancy Sheriff, RN, MSN, BSN, CCM [facility] Care Coordinator [phone]

## 2023-07-26 ENCOUNTER — Other Ambulatory Visit (HOSPITAL_COMMUNITY): Payer: Self-pay

## 2023-07-26 ENCOUNTER — Encounter: Payer: Self-pay | Admitting: Student

## 2023-07-26 ENCOUNTER — Ambulatory Visit: Payer: 59 | Admitting: Emergency Medicine

## 2023-07-26 ENCOUNTER — Ambulatory Visit: Payer: 59 | Attending: Cardiovascular Disease | Admitting: Student

## 2023-07-26 VITALS — BP 132/68 | Wt 156.0 lb

## 2023-07-26 DIAGNOSIS — E1169 Type 2 diabetes mellitus with other specified complication: Secondary | ICD-10-CM | POA: Diagnosis not present

## 2023-07-26 DIAGNOSIS — E785 Hyperlipidemia, unspecified: Secondary | ICD-10-CM | POA: Diagnosis not present

## 2023-07-26 DIAGNOSIS — E1159 Type 2 diabetes mellitus with other circulatory complications: Secondary | ICD-10-CM

## 2023-07-26 DIAGNOSIS — I152 Hypertension secondary to endocrine disorders: Secondary | ICD-10-CM | POA: Diagnosis not present

## 2023-07-26 DIAGNOSIS — N184 Chronic kidney disease, stage 4 (severe): Secondary | ICD-10-CM | POA: Diagnosis not present

## 2023-07-26 DIAGNOSIS — Z794 Long term (current) use of insulin: Secondary | ICD-10-CM | POA: Diagnosis not present

## 2023-07-26 MED ORDER — EZETIMIBE 10 MG PO TABS: 10.0000 mg | ORAL_TABLET | Freq: Every day | ORAL | 3 refills | Status: DC

## 2023-07-26 MED ORDER — ADVANCED ONE STEP BP MONITOR MISC: 1.0000 [IU] | Freq: Every day | 0 refills | Status: AC

## 2023-07-26 NOTE — Progress Notes (Signed)
Patient ID: Nancy [name]                 DOB: March 07, 1956                      MRN: [id number]      HPI: Learah Costley is a 67 y.o. female referred by Dr. Izora Ribas  to HTN clinic. PMH is significant for HTN with DM, CKD Stage 5, Obesity. Frequent hospital visit 05/14/23- cellulitis R middle finger, 05/19/23 AKI, 06/17/23 HHS. Patient saw Dr.Chandrasekhar on 06/30/23 BP in office 144/90 heart rate 81. Diltiazem XL was increased to 360 mg.  Patient presented today for HTN clinic. Reports her mother diagnosed with cancer again so she is stressed about her health. She has not seen nephrologist yet bu she is trying to manage her BG with insulin and she was put on Farxiga. She likes salt specially she likes to add salt to her fruits. Advised to cut down on salt intake. Denies SOB, palpitation, chest pain or dizziness. She only get dizzy when she moves quickly[place]. But she does not have any exercise schedule. She doe not have home BP monitor and new is cost prohibitive. She takes atorvastatin 40 mg daily and tolerates it well    Last BMP during hospital visit  SrCr 6.03 eGFR 7 mL/min Current HTN meds: Diltiazem XL 360 mg daily  Previously tried: ACEi - persistent dry cough   BP goal: <130/80   Current lipid medication: atorvastatin 40 mg  Last LDLc :90 mg/dl 93/2355 LDL goal < 55 mg/dl  Risk factors: CKD, D3UK, HTN, obesity, 10 yrs ASCVD risk score 18   Family History:  Mother: lung cancer Bother: HF   Social History:  Alcohol: none  Tobacco Smoking: none  Recreational drug use: none  Diet:  doe snot watch salt intake, likes to add salt to her food and fruits   [place]    Home BP readings: does not have monitor    Wt Readings from Last 3 Encounters:  07/26/23 156 lb (70.8 kg)  06/30/23 157 lb (71.2 kg)  06/29/23 156 lb 8 oz (71 kg)   BP Readings from Last 3 Encounters:  07/26/23 132/68   06/30/23 (!) 144/90  06/29/23 (!) 180/110   Pulse Readings from Last 3 Encounters:  06/30/23 81  06/29/23 86  06/19/23 77    Renal function: CrCl cannot be calculated (Patient's most recent lab result is older than the maximum 21 days allowed.).  Past Medical History:  Diagnosis Date   Carpal tunnel syndrome, bilateral    confirmed on nerve conduction studies   Chronic hepatitis C (HCC)    considered cured 06/2016 post retroviral therapy    Diabetes mellitus type 2, uncontrolled    Diabetic peripheral neuropathy (HCC)    GERD (gastroesophageal reflux disease)    History of endometriosis    History of Helicobacter infection 11/07   Hyperlipidemia    Liver fibrosis 02/17/2016   F2/3     Current Outpatient Medications on File Prior to Visit  Medication Sig Dispense Refill   atorvastatin (LIPITOR) 40 MG tablet TAKE 1 TABLET BY MOUTH EVERY DAY 90 tablet 3   Cholecalciferol (VITAMIN D3) 25 MCG (1000 UT) CAPS Take 1 capsule by mouth daily.     Continuous Glucose Receiver (DEXCOM G7 RECEIVER) DEVI To check blood sugars twice a day 1 each 3   Continuous Glucose  Sensor (DEXCOM G7 SENSOR) MISC Use to check blood sugars twice a day 3 each 3   diltiazem (CARDIZEM CD) 360 MG 24 hr capsule Take 1 capsule (360 mg total) by mouth daily. 90 capsule 3   insulin lispro (HUMALOG) 100 UNIT/ML KwikPen For sugar 120 to 150 use one unit, for 151 to 200 use two units, for 201 to 250 use three units, for 251 to 300 use five units, for 301 to 350 use seven units, for 351 to 400 use 9 units, if sugar more than 400 call your primary care provider. 15 mL 0   Insulin Pen Needle (PEN NEEDLES) 31G X 5 MM MISC 1 each by Does not apply route 3 (three) times daily with meals. May substitute to any manufacturer covered by patient's insurance. 100 each 0   ACCU-CHEK FASTCLIX LANCETS MISC Use to check blood sugar up to 3 times a day 306 each 3   ACCU-CHEK GUIDE test strip CHECK BLOOD SUGAR 3 TIMES A DAY 300 strip 3    Blood Glucose Monitoring Suppl (ACCU-CHEK GUIDE) w/Device KIT 1 each by Does not apply route 3 (three) times daily. Check blood sugar 3 times a day 1 kit 0   cyclobenzaprine (FLEXERIL) 10 MG tablet TAKE 1 TABLET BY MOUTH EVERYDAY AT BEDTIME 30 tablet 1   diclofenac Sodium (VOLTAREN) 1 % GEL Apply 2 g topically 4 (four) times daily. 150 g 0   furosemide (LASIX) 40 MG tablet TAKE 1 TABLET BY MOUTH THREE TIMES PER WEEK 60 tablet 2   insulin glargine (LANTUS) 100 UNIT/ML injection Inject 0.1 mLs (10 Units total) into the skin daily. 3 mL 0   Insulin Syringe-Needle U-100 31G X 15/64" 0.5 ML MISC Use to inject insulin one time a day 100 each 0   Insulin Syringes, Disposable, U-100 1 ML MISC 2 Syringes by Does not apply route daily. Use to inject insulin into the skin 1 time daily. diag code E11.9. Insulin dependent 100 each 0   omeprazole (PRILOSEC) 40 MG capsule TAKE 1 CAPSULE BY MOUTH EVERY DAY 90 capsule 3   senna (SENOKOT) 8.6 MG tablet Take 1 tablet (8.6 mg total) by mouth as needed for constipation. 60 tablet 3   No current facility-administered medications on file prior to visit.    Allergies  Allergen Reactions   Ace Inhibitors Other (See Comments) and Cough    Persistent dry cough   Amitriptyline Hcl Nausea And Vomiting and Other (See Comments)    GI Intolerance/Upset stomach   Aspirin Other (See Comments)    GI Intolerance   Latex Itching   Prednisone Nausea And Vomiting    Blood pressure 132/68, weight 156 lb (70.8 kg), last menstrual period 02/15/2009.   Assessment/Plan:  1. Hypertension -  Hypertension associated with diabetes (HCC) Assessment: In office BP 132/68 mmHg (goal <130/80) Does not check BP at home - does not have monitor at home  Tolerates diltiazem Xl 360 mg daily well without any side effects  Does not watch salt intake  Reiterated the importance of regular exercise and low salt diet    Plan:  BP monitor would be covered by Medicaid, prescription for BP  monitor sent to [facility]  Continue taking Diltiazem Xl 360 mg daily  Patient to keep record of BP readings with heart rate and report to [place] at the next visit, brig in BP monitor for validation  Due to declined renal function 1st line HTN agent would not abe appropriate to consider. In  future can consider adding direct vasodilator - hydralazin 10 mg three times daily  Patient to see PharmD in 3  weeks for follow up  Labs will get BMP today to see renal function    Hyperlipidemia associated with type 2 diabetes mellitus (HCC) Assessment and plan:  LDL goal: <55 mg/dl last LDLc  90 mg/dl (10/9146) while on Lipitor 40 mg  Tolerates high intensity statins well without any side effects - continue taking Lipitor 40 mg daily  Risk factors includes CKD, T2DM, HTN, obesity, 10 yrs ASCVD risk score 18  Will get updated lipid lab today  Zetia 10 mg daily initiated  Reiterated importance of heart healthy diet and regular exercise         Thank you  Carmela Hurt, Pharm.D [facility] [address], [place], [place] [address]  Phone: [phone]; Fax: [phone]

## 2023-07-26 NOTE — Patient Instructions (Addendum)
Changes made by your pharmacist Carmela Hurt, PharmD at today's visit:    Instructions/Changes  (what do you need to do) Your Notes  (what you did and when you did it)  Start taking ezetimibe 10 mg daily    Get new BP monitor from [facility] pharmacy -[address], [place] [address] Phone: [phone]     Continue taking Diltiazem XL 360 mg daily and atorvastatin 40 mg daily     Bring all of your meds, your BP cuff and your record of home blood pressures to your next appointment.    HOW TO TAKE YOUR BLOOD PRESSURE AT HOME  Rest 5 minutes before taking your blood pressure.  Don't smoke or drink caffeinated beverages for at least 30 minutes before. Take your blood pressure before (not after) you eat. Sit comfortably with your back supported and both feet on the floor (don't cross your legs). Elevate your arm to heart level on a table or a desk. Use the proper sized cuff. It should fit smoothly and snugly around your bare upper arm. There should be enough room to slip a fingertip under the cuff. The bottom edge of the cuff should be 1 inch above the crease of the elbow. Ideally, take 3 measurements at one sitting and record the average.  Important lifestyle changes to control high blood pressure  Intervention  Effect on the BP  Lose extra pounds and watch your waistline Weight loss is one of the most effective lifestyle changes for controlling blood pressure. If you're overweight or obese, losing even a small amount of weight can help reduce blood pressure. Blood pressure might go down by about 1 millimeter of mercury (mm Hg) with each kilogram (about 2.2 pounds) of weight lost.  Exercise regularly As a general goal, aim for at least 30 minutes of moderate physical activity every day. Regular physical activity can lower high blood pressure by about 5 to 8 mm Hg.  Eat a healthy diet Eating a diet rich in whole grains, fruits, vegetables, and low-fat dairy products and low in  saturated fat and cholesterol. A healthy diet can lower high blood pressure by up to 11 mm Hg.  Reduce salt (sodium) in your diet Even a small reduction of sodium in the diet can improve heart health and reduce high blood pressure by about 5 to 6 mm Hg.  Limit alcohol One drink equals 12 ounces of beer, 5 ounces of wine, or 1.5 ounces of 80-proof liquor.  Limiting alcohol to less than one drink a day for women or two drinks a day for men can help lower blood pressure by about 4 mm Hg.   If you have any questions or concerns please use My Chart to send questions or call the office at [phone]

## 2023-07-26 NOTE — Assessment & Plan Note (Addendum)
Assessment and plan:  LDL goal: <55 mg/dl last LDLc  90 mg/dl (16/1096) while on Lipitor 40 mg  Tolerates high intensity statins well without any side effects - continue taking Lipitor 40 mg daily  Risk factors includes CKD, T2DM, HTN, obesity, 10 yrs ASCVD risk score 18  Will get updated lipid lab today  Zetia 10 mg daily initiated  Reiterated importance of heart healthy diet and regular exercise

## 2023-07-26 NOTE — Assessment & Plan Note (Signed)
Assessment: In office BP 132/68 mmHg (goal <130/80) Does not check BP at home - does not have monitor at home  Tolerates diltiazem Xl 360 mg daily well without any side effects  Does not watch salt intake  Reiterated the importance of regular exercise and low salt diet    Plan:  BP monitor would be covered by Medicaid, prescription for BP monitor sent to [facility]  Continue taking Diltiazem Xl 360 mg daily  Patient to keep record of BP readings with heart rate and report to [place] at the next visit, brig in BP monitor for validation  Due to declined renal function 1st line HTN agent would not abe appropriate to consider. In future can consider adding direct vasodilator - hydralazin 10 mg three times daily  Patient to see PharmD in 3  weeks for follow up  Labs will get BMP today to see renal function

## 2023-07-27 LAB — BASIC METABOLIC PANEL
BUN/Creatinine Ratio: 6 — ABNORMAL LOW (ref 12–28)
BUN: 59 mg/dL — ABNORMAL HIGH (ref 8–27)
CO2: 17 mmol/L — ABNORMAL LOW (ref 20–29)
Calcium: 8.2 mg/dL — ABNORMAL LOW (ref 8.7–10.3)
Chloride: 103 mmol/L (ref 96–106)
Creatinine, Ser: 9.29 mg/dL — ABNORMAL HIGH (ref 0.57–1.00)
Glucose: 69 mg/dL — ABNORMAL LOW (ref 70–99)
Potassium: 4.7 mmol/L (ref 3.5–5.2)
Sodium: 139 mmol/L (ref 134–144)
eGFR: 4 mL/min/{1.73_m2} — ABNORMAL LOW (ref >59)

## 2023-08-03 ENCOUNTER — Ambulatory Visit: Payer: Self-pay

## 2023-08-03 NOTE — Patient Instructions (Signed)
Visit Information  Thank you for taking time to visit with me today. Please don't hesitate to contact me if I can be of assistance to you.   Following are the goals we discussed today:   Goals Addressed             This Visit's Progress    Assist with health management       Interventions Today    Flowsheet Row Most Recent Value  Chronic Disease   Chronic disease during today's visit Diabetes, Congestive Heart Failure (CHF), Hypertension (HTN), Chronic Kidney Disease/End Stage Renal Disease (ESRD)  General Interventions   General Interventions Discussed/Reviewed General Interventions Reviewed, Doctor Visits  Doctor Visits Discussed/Reviewed Doctor Visits Reviewed, Specialist, PCP  PCP/Specialist Visits Compliance with follow-up visit  [reviewed upcoming appointments, encouraged to schedule appointment with nephrologist]  Exercise Interventions   Exercise Discussed/Reviewed Physical Activity  Physical Activity Discussed/Reviewed Physical Activity Reviewed  [encouraged to be as active as tolerated]  Education Interventions   Education Provided Provided Education  Provided Verbal Education On Insurance Plans, Blood Sugar Monitoring, Medication, When to see the doctor, Nutrition  [discussed importance of taking medications as prescribed, weighing self, keeping track of blood pressure. advised to continue to follow up with clinical pharmacist as recommended.]  Nutrition Interventions   Nutrition Discussed/Reviewed Decreasing salt  [discussed limiting salt intake]  Pharmacy Interventions   Pharmacy Dicussed/Reviewed Pharmacy Topics Reviewed  [confirmed patient is taking medications as prescribed-taking furosemide one tablet three times/week.]            Our next appointment is by telephone on 08/31/23 at 10:00 am  Please call the care guide team at [phone] if you need to cancel or reschedule your appointment.   If you are experiencing a Mental Health or Behavioral Health  Crisis or need someone to talk to, please call the Suicide and Crisis Lifeline: 988 call the [place] National Suicide Prevention Lifeline: (607) 468-3520 or TTY: 4847420119 TTY (725)857-2335) to talk to a trained counselor call 1-800-273-TALK (toll free, 24 hour hotline)  [name], RN, MSN, BSN, CCM Care Management Coordinator [phone]

## 2023-08-03 NOTE — Patient Outreach (Signed)
  Care Coordination   Follow Up Visit Note   08/03/2023 Name: Huyen Jungers MRN: [id number] DOB: October 24, 1956  Nancy [name] is a 67 y.o. year old female who sees [name], [name], MD for primary care. I spoke with  [name] by phone today.  What matters to the patients health and wellness today?  Completed appointment with cardiology pharmacist. Patient reports has not obtained BP monitor yet or scales. She states she has restarted furosemide and is going to schedule an appointment with nephrologist. She states she is taking care of her mother and coordinates her care with her mother's care needs.  Goals Addressed             This Visit's Progress    Assist with health management       Interventions Today    Flowsheet Row Most Recent Value  Chronic Disease   Chronic disease during today's visit Diabetes, Congestive Heart Failure (CHF), Hypertension (HTN), Chronic Kidney Disease/End Stage Renal Disease (ESRD)  General Interventions   General Interventions Discussed/Reviewed General Interventions Reviewed, Doctor Visits  Doctor Visits Discussed/Reviewed Doctor Visits Reviewed, Specialist, PCP  PCP/Specialist Visits Compliance with follow-up visit  [reviewed upcoming appointments, encouraged to schedule appointment with nephrologist]  Exercise Interventions   Exercise Discussed/Reviewed Physical Activity  Physical Activity Discussed/Reviewed Physical Activity Reviewed  [encouraged to be as active as tolerated]  Education Interventions   Education Provided Provided Education  Provided Verbal Education On Insurance Plans, Blood Sugar Monitoring, Medication, When to see the doctor, Nutrition  [discussed importance of taking medications as prescribed, weighing self, keeping track of blood pressure. advised to continue to follow up with clinical pharmacist as recommended.]  Nutrition Interventions   Nutrition Discussed/Reviewed Decreasing salt  [discussed limiting salt intake]   Pharmacy Interventions   Pharmacy Dicussed/Reviewed Pharmacy Topics Reviewed  [confirmed patient is taking medications as prescribed-taking furosemide one tablet three times/week.]            SDOH assessments and interventions completed:  No  Care Coordination Interventions:  Yes, provided   Follow up plan: Follow up call scheduled for 08/31/23    Encounter Outcome:  Pt. Visit Completed   [name], RN, MSN, BSN, CCM Care Management Coordinator [phone]

## 2023-08-04 ENCOUNTER — Ambulatory Visit (INDEPENDENT_AMBULATORY_CARE_PROVIDER_SITE_OTHER): Payer: 59 | Admitting: *Deleted

## 2023-08-04 DIAGNOSIS — Z23 Encounter for immunization: Secondary | ICD-10-CM | POA: Diagnosis not present

## 2023-08-04 NOTE — Progress Notes (Signed)
Patient is here for her flu shot given in her left deltoid. Patient tolerated well

## 2023-08-10 ENCOUNTER — Ambulatory Visit: Payer: 59 | Attending: Emergency Medicine

## 2023-08-10 NOTE — Progress Notes (Deleted)
Patient ID: Nancy [name]                 DOB: 08-01-1956                      MRN: [id number]      HPI: Nancy [name] is a 67 y.o. female referred by Dr. Izora Ribas  to HTN clinic. PMH is significant for HTN with DM, CKD Stage 5, Obesity. Frequent hospital visit 05/14/23- cellulitis R middle finger, 05/19/23 AKI, 06/17/23 HHS. Patient saw Dr.Chandrasekhar on 06/30/23 BP in office 144/90 heart rate 81. Diltiazem XL was increased to 360 mg.   Patient seen in HTN clinic 07/26/23. An Rx for BP machine [facility].   Patient presented today for HTN clinic. Reports her mother diagnosed with cancer again so she is stressed about her health. She has not seen nephrologist yet bu she is trying to manage her BG with insulin and she was put on Farxiga. She likes salt specially she likes to add salt to her fruits. Advised to cut down on salt intake. Denies SOB, palpitation, chest pain or dizziness. She only get dizzy when she moves quickly. [place]. But she does not have any exercise schedule. She doe not have home BP monitor and new is cost prohibitive. She takes atorvastatin 40 mg daily and tolerates it well    Last BMP during hospital visit  SrCr 6.03 eGFR 7 mL/min Current HTN meds: Diltiazem XL 360 mg daily  Previously tried: ACEi - persistent dry cough   BP goal: <130/80   Current lipid medication: atorvastatin 40 mg  Last LDLc :90 mg/dl 13/0865 LDL goal < 55 mg/dl  Risk factors: CKD, H8IO, HTN, obesity, 10 yrs ASCVD risk score 18   Family History:  Mother: lung cancer Bother: HF   Social History:  Alcohol: none  Tobacco Smoking: none  Recreational drug use: none  Diet:  doe snot watch salt intake, likes to add salt to her food and fruits   [place]    Home BP readings: does not have monitor    Wt Readings from Last 3 Encounters:  07/26/23 156 lb (70.8 kg)  06/30/23 157 lb (71.2 kg)   06/29/23 156 lb 8 oz (71 kg)   BP Readings from Last 3 Encounters:  07/26/23 132/68  06/30/23 (!) 144/90  06/29/23 (!) 180/110   Pulse Readings from Last 3 Encounters:  06/30/23 81  06/29/23 86  06/19/23 77    Renal function: CrCl cannot be calculated (Unknown ideal weight.).  Past Medical History:  Diagnosis Date   Carpal tunnel syndrome, bilateral    confirmed on nerve conduction studies   Chronic hepatitis C (HCC)    considered cured 06/2016 post retroviral therapy    Diabetes mellitus type 2, uncontrolled    Diabetic peripheral neuropathy (HCC)    GERD (gastroesophageal reflux disease)    History of endometriosis    History of Helicobacter infection 11/07   Hyperlipidemia    Liver fibrosis 02/17/2016   F2/3     Current Outpatient Medications on File Prior to Visit  Medication Sig Dispense Refill   ACCU-CHEK FASTCLIX LANCETS MISC Use to check blood sugar up to 3 times a day 306 each 3   ACCU-CHEK GUIDE test strip CHECK BLOOD SUGAR 3 TIMES A DAY 300 strip 3   atorvastatin (LIPITOR) 40 MG tablet TAKE 1 TABLET BY MOUTH EVERY  DAY 90 tablet 3   Blood Glucose Monitoring Suppl (ACCU-CHEK GUIDE) w/Device KIT 1 each by Does not apply route 3 (three) times daily. Check blood sugar 3 times a day 1 kit 0   Blood Pressure Monitoring (ADVANCED ONE STEP BP MONITOR) MISC 1 Units by Does not apply route daily. 1 each 0   Cholecalciferol (VITAMIN D3) 25 MCG (1000 UT) CAPS Take 1 capsule by mouth daily.     Continuous Glucose Receiver (DEXCOM G7 RECEIVER) DEVI To check blood sugars twice a day 1 each 3   Continuous Glucose Sensor (DEXCOM G7 SENSOR) MISC Use to check blood sugars twice a day 3 each 3   cyclobenzaprine (FLEXERIL) 10 MG tablet TAKE 1 TABLET BY MOUTH EVERYDAY AT BEDTIME 30 tablet 1   diclofenac Sodium (VOLTAREN) 1 % GEL Apply 2 g topically 4 (four) times daily. 150 g 0   diltiazem (CARDIZEM CD) 360 MG 24 hr capsule Take 1 capsule (360 mg total) by mouth daily. 90 capsule 3    ezetimibe (ZETIA) 10 MG tablet Take 1 tablet (10 mg total) by mouth daily. 90 tablet 3   furosemide (LASIX) 40 MG tablet TAKE 1 TABLET BY MOUTH THREE TIMES PER WEEK 60 tablet 2   insulin glargine (LANTUS) 100 UNIT/ML injection Inject 0.1 mLs (10 Units total) into the skin daily. 3 mL 0   insulin lispro (HUMALOG) 100 UNIT/ML KwikPen For sugar 120 to 150 use one unit, for 151 to 200 use two units, for 201 to 250 use three units, for 251 to 300 use five units, for 301 to 350 use seven units, for 351 to 400 use 9 units, if sugar more than 400 call your primary care provider. 15 mL 0   Insulin Pen Needle (PEN NEEDLES) 31G X 5 MM MISC 1 each by Does not apply route 3 (three) times daily with meals. May substitute to any manufacturer covered by patient's insurance. 100 each 0   Insulin Syringe-Needle U-100 31G X 15/64" 0.5 ML MISC Use to inject insulin one time a day 100 each 0   Insulin Syringes, Disposable, U-100 1 ML MISC 2 Syringes by Does not apply route daily. Use to inject insulin into the skin 1 time daily. diag code E11.9. Insulin dependent 100 each 0   omeprazole (PRILOSEC) 40 MG capsule TAKE 1 CAPSULE BY MOUTH EVERY DAY 90 capsule 3   senna (SENOKOT) 8.6 MG tablet Take 1 tablet (8.6 mg total) by mouth as needed for constipation. 60 tablet 3   No current facility-administered medications on file prior to visit.    Allergies  Allergen Reactions   Ace Inhibitors Other (See Comments) and Cough    Persistent dry cough   Amitriptyline Hcl Nausea And Vomiting and Other (See Comments)    GI Intolerance/Upset stomach   Aspirin Other (See Comments)    GI Intolerance   Latex Itching   Prednisone Nausea And Vomiting    Last menstrual period 02/15/2009.   Assessment/Plan:  1. Hypertension -  No problem-specific Assessment & Plan notes found for this encounter.      Thank you  Olene Floss, Pharm.D, BCACP, BCPS, CPP [facility] A Division of [facility] [address], [place], [place] [address]  Phone: [phone]; Fax: [phone]

## 2023-08-11 ENCOUNTER — Other Ambulatory Visit: Payer: 59

## 2023-08-12 ENCOUNTER — Ambulatory Visit: Payer: 59 | Admitting: Emergency Medicine

## 2023-08-16 ENCOUNTER — Ambulatory Visit (INDEPENDENT_AMBULATORY_CARE_PROVIDER_SITE_OTHER): Payer: 59 | Admitting: Emergency Medicine

## 2023-08-16 ENCOUNTER — Encounter: Payer: Self-pay | Admitting: Emergency Medicine

## 2023-08-16 VITALS — BP 160/82 | Temp 97.8°F | Wt 165.0 lb

## 2023-08-16 DIAGNOSIS — N185 Chronic kidney disease, stage 5: Secondary | ICD-10-CM

## 2023-08-16 DIAGNOSIS — E785 Hyperlipidemia, unspecified: Secondary | ICD-10-CM

## 2023-08-16 DIAGNOSIS — I152 Hypertension secondary to endocrine disorders: Secondary | ICD-10-CM

## 2023-08-16 DIAGNOSIS — E1169 Type 2 diabetes mellitus with other specified complication: Secondary | ICD-10-CM | POA: Diagnosis not present

## 2023-08-16 DIAGNOSIS — R2681 Unsteadiness on feet: Secondary | ICD-10-CM | POA: Diagnosis not present

## 2023-08-16 DIAGNOSIS — Z794 Long term (current) use of insulin: Secondary | ICD-10-CM

## 2023-08-16 DIAGNOSIS — E1159 Type 2 diabetes mellitus with other circulatory complications: Secondary | ICD-10-CM

## 2023-08-16 DIAGNOSIS — R600 Localized edema: Secondary | ICD-10-CM | POA: Diagnosis not present

## 2023-08-16 LAB — POCT GLYCOSYLATED HEMOGLOBIN (HGB A1C): Hemoglobin A1C: 8.9 % — AB (ref 4.0–5.6)

## 2023-08-16 MED ORDER — FUROSEMIDE 40 MG PO TABS: 40.0000 mg | ORAL_TABLET | Freq: Every day | ORAL | 3 refills | Status: DC

## 2023-08-16 NOTE — Assessment & Plan Note (Signed)
Diet and nutrition discussed Continues atorvastatin 40 mg daily

## 2023-08-16 NOTE — Assessment & Plan Note (Signed)
Prescription for rollator provided

## 2023-08-16 NOTE — Patient Instructions (Addendum)
Edema  Edema is when you have too much fluid in your body or under your skin. Edema may make your legs, feet, and ankles swell. Swelling often happens in looser tissues, such as around your eyes. This is a common condition. It gets more common as you get older. There are many possible causes of edema. These include: Eating too much salt (sodium). Being on your feet or sitting for a long time. Certain medical conditions, such as: Pregnancy. Heart failure. Liver disease. Kidney disease. Cancer. Hot weather may make edema worse. Edema is usually painless. Your skin may look swollen or shiny. Follow these instructions at home: Medicines Take over-the-counter and prescription medicines only as told by your doctor. Your doctor may prescribe a medicine to help your body get rid of extra water (diuretic). Take this medicine if you are told to take it. Eating and drinking Eat a low-salt (low-sodium) diet as told by your doctor. Sometimes, eating less salt may reduce swelling. Depending on the cause of your swelling, you may need to limit how much fluid you drink (fluid restriction). General instructions Raise the injured area above the level of your heart while you are sitting or lying down. Do not sit still or stand for a long time. Do not wear tight clothes. Do not wear garters on your upper legs. Exercise your legs. This can help the swelling go down. Wear compression stockings as told by your doctor. It is important that these are the right size. These should be prescribed by your doctor to prevent possible injuries. If elastic bandages or wraps are recommended, use them as told by your doctor. Contact a doctor if: Treatment is not working. You have heart, liver, or kidney disease and have symptoms of edema. You have sudden and unexplained weight gain. Get help right away if: You have shortness of breath or chest pain. You cannot breathe when you lie down. You have pain, redness, or  warmth in the swollen areas. You have heart, liver, or kidney disease and get edema all of a sudden. You have a fever and your symptoms get worse all of a sudden. These symptoms may be an emergency. Get help right away. Call 911. Do not wait to see if the symptoms will go away. Do not drive yourself to the hospital. Summary Edema is when you have too much fluid in your body or under your skin. Edema may make your legs, feet, and ankles swell. Swelling often happens in looser tissues, such as around your eyes. Raise the injured area above the level of your heart while you are sitting or lying down. Follow your doctor's instructions about diet and how much fluid you can drink. This information is not intended to replace advice given to you by your health care provider. Make sure you discuss any questions you have with your health care provider. Document Revised: 07/28/2021 Document Reviewed: 07/28/2021 Elsevier Patient Education  2024 [employer] Diabetic Sliding Scale (I)  IF BLOOD SUGAR IS:  LESS THAN 100 ( NO INSULIN)  100-140 (2 UNITS)  141-180 (4 UNITS)  181-220 (6 UNITS)  221-260 (8 UNITS)  261-300 (10 UNITS)  301-340 (12 UNITS)  MORE THAN 341 UNITS (14 UNITS)

## 2023-08-16 NOTE — Assessment & Plan Note (Signed)
Still making significant amounts of urine daily Has been taking furosemide 40 mg 3 times a week Recommend to start taking furosemide 40 mg daily Diet and nutrition discussed

## 2023-08-16 NOTE — Assessment & Plan Note (Signed)
BP Readings from Last 3 Encounters:  08/16/23 (!) 160/82  07/26/23 132/68  06/30/23 (!) 144/90  Elevated blood pressure reading in the office but normal readings at home. Continue Cardizem 360 mg daily Elevated hemoglobin A1c.  Has not been consistent with insulin at home. Lab Results  Component Value Date   HGBA1C 8.9 (A) 08/16/2023  Advised to increase Lantus morning insulin to 24 units Sliding scale provided for Premeal lispro insulin Cardiovascular risks associated with uncontrolled diabetes discussed Diet and nutrition discussed

## 2023-08-16 NOTE — Progress Notes (Signed)
Nancy [name] 67 y.o.   Chief Complaint  Patient presents with   Acute Visit    Swelling in both legs and ankles, started 1 month ago, soreness on both feet when touched    HISTORY OF PRESENT ILLNESS: This is a 67 y.o. female complaining of swelling of both legs and ankles progressively getting worse over the past month Has some dyspnea on exertion.  Denies chest pain. No other associated symptoms. Diabetic and hypertensive.  History of advanced CKD.  Has appointment to see nephrologist September 19. BP Readings from Last 3 Encounters:  07/26/23 132/68  06/30/23 (!) 144/90  06/29/23 (!) 180/110   Wt Readings from Last 3 Encounters:  07/26/23 156 lb (70.8 kg)  06/30/23 157 lb (71.2 kg)  06/29/23 156 lb 8 oz (71 kg)     HPI   Prior to Admission medications   Medication Sig Start Date End Date Taking? Authorizing Provider  ACCU-CHEK FASTCLIX LANCETS MISC Use to check blood sugar up to 3 times a day 10/11/18   Eulah Pont, MD  ACCU-CHEK GUIDE test strip CHECK BLOOD SUGAR 3 TIMES A DAY 03/30/23   Georgina Quint, MD  atorvastatin (LIPITOR) 40 MG tablet TAKE 1 TABLET BY MOUTH EVERY DAY 03/30/23   Georgina Quint, MD  Blood Glucose Monitoring Suppl (ACCU-CHEK GUIDE) w/Device KIT 1 each by Does not apply route 3 (three) times daily. Check blood sugar 3 times a day 06/19/23   Arrien, York Ram, MD  Blood Pressure Monitoring (ADVANCED ONE STEP BP MONITOR) MISC 1 Units by Does not apply route daily. 07/26/23   Christell Constant, MD  Cholecalciferol (VITAMIN D3) 25 MCG (1000 UT) CAPS Take 1 capsule by mouth daily.    [provider]  Continuous Glucose Receiver (DEXCOM G7 RECEIVER) DEVI To check blood sugars twice a day 06/23/23   Georgina Quint, MD  Continuous Glucose Sensor (DEXCOM G7 SENSOR) MISC Use to check blood sugars twice a day 06/23/23   Georgina Quint, MD  cyclobenzaprine (FLEXERIL) 10 MG tablet TAKE 1 TABLET BY MOUTH EVERYDAY AT BEDTIME  07/12/23   Corwin Levins, MD  diclofenac Sodium (VOLTAREN) 1 % GEL Apply 2 g topically 4 (four) times daily. 01/06/22   Adron Bene, MD  diltiazem (CARDIZEM CD) 360 MG 24 hr capsule Take 1 capsule (360 mg total) by mouth daily. 06/30/23   Christell Constant, MD  ezetimibe (ZETIA) 10 MG tablet Take 1 tablet (10 mg total) by mouth daily. 07/26/23 10/24/23  Christell Constant, MD  furosemide (LASIX) 40 MG tablet TAKE 1 TABLET BY MOUTH THREE TIMES PER WEEK 04/27/23   Georgina Quint, MD  insulin glargine (LANTUS) 100 UNIT/ML injection Inject 0.1 mLs (10 Units total) into the skin daily. 06/19/23 07/19/23  Arrien, York Ram, MD  insulin lispro (HUMALOG) 100 UNIT/ML KwikPen For sugar 120 to 150 use one unit, for 151 to 200 use two units, for 201 to 250 use three units, for 251 to 300 use five units, for 301 to 350 use seven units, for 351 to 400 use 9 units, if sugar more than 400 call your primary care provider. 06/19/23   Arrien, York Ram, MD  Insulin Pen Needle (PEN NEEDLES) 31G X 5 MM MISC 1 each by Does not apply route 3 (three) times daily with meals. May substitute to any manufacturer covered by patient's insurance. 06/19/23   Arrien, York Ram, MD  Insulin Syringe-Needle U-100 31G X 15/64" 0.5 ML MISC Use to  inject insulin one time a day 06/19/23   Arrien, York Ram, MD  Insulin Syringes, Disposable, U-100 1 ML MISC 2 Syringes by Does not apply route daily. Use to inject insulin into the skin 1 time daily. diag code E11.9. Insulin dependent 06/19/23   Arrien, York Ram, MD  omeprazole (PRILOSEC) 40 MG capsule TAKE 1 CAPSULE BY MOUTH EVERY DAY 03/30/23   Georgina Quint, MD  senna (SENOKOT) 8.6 MG tablet Take 1 tablet (8.6 mg total) by mouth as needed for constipation. 07/08/20   Merrilyn Puma, MD    Allergies  Allergen Reactions   Ace Inhibitors Other (See Comments) and Cough    Persistent dry cough   Amitriptyline Hcl Nausea And Vomiting and Other  (See Comments)    GI Intolerance/Upset stomach   Aspirin Other (See Comments)    GI Intolerance   Latex Itching   Prednisone Nausea And Vomiting    Patient Active Problem List   Diagnosis Date Noted   Hypertensive heart and chronic kidney disease stage 5 (HCC) 06/30/2023   Chronic diastolic CHF (congestive heart failure) (HCC) 06/18/2023   Class 1 obesity 06/18/2023   Dyspnea on exertion 05/19/2023   Uncontrolled type 2 diabetes mellitus with hyperglycemia (HCC) 05/19/2023   Malnutrition of moderate degree 04/15/2023   Hyperglycemia 04/13/2023   CKD (chronic kidney disease) stage 5, GFR less than 15 ml/min (HCC) 03/09/2023   Bilateral hearing loss due to cerumen impaction 01/14/2023   Stage 4 chronic kidney disease (HCC) 02/09/2022   Hyperlipidemia associated with type 2 diabetes mellitus (HCC) 04/20/2019   Vaginal atrophy 07/22/2018   Morbid obesity (HCC) 08/04/2017   GERD (gastroesophageal reflux disease) 01/16/2014   Carpal tunnel syndrome, bilateral    Hypertension associated with diabetes (HCC) 11/09/2006   Controlled diabetes mellitus with retinopathy (HCC) 12/07/2002    Past Medical History:  Diagnosis Date   Carpal tunnel syndrome, bilateral    confirmed on nerve conduction studies   Chronic hepatitis C (HCC)    considered cured 06/2016 post retroviral therapy    Diabetes mellitus type 2, uncontrolled    Diabetic peripheral neuropathy (HCC)    GERD (gastroesophageal reflux disease)    History of endometriosis    History of Helicobacter infection 11/07   Hyperlipidemia    Liver fibrosis 02/17/2016   F2/3     Past Surgical History:  Procedure Laterality Date   ABDOMINAL HYSTERECTOMY     CARPAL TUNNEL RELEASE      Social History   Socioeconomic History   Marital status: Single    Spouse name: Not on file   Number of children: 0   Years of education: 12   Highest education level: Not on file  Occupational History   Occupation: Scientist, research (medical): [employer]   Occupation: Disabled  Tobacco Use   Smoking status: Never   Smokeless tobacco: Never  Vaping Use   Vaping status: Never Used  Substance and Sexual Activity   Alcohol use: No    Alcohol/week: 0.0 standard drinks of alcohol   Drug use: No   Sexual activity: Yes    Birth control/protection: None    Comment: 1 committed partner  Other Topics Concern   Not on file  Social History Narrative   Current Social History 08/28/2019        Patient lives alone in a 7th floor apartment with elevator. There are not steps up to the entrance the patient uses.       Patient's method  of transportation is via family member (mom or friend).      The highest level of education was high school diploma.      The patient currently disabled.      Identified important Relationships are "My mother"       Pets : None       Interests / Fun: "TV, movies"       Current Stressors: "People, my health"       Religious / Personal Beliefs: "Baptist"       L. Leward Quan, RN, BSN       Social Determinants of Health   Financial Resource Strain: Low Risk  (04/24/2022)   Overall Financial Resource Strain (CARDIA)    Difficulty of Paying Living Expenses: Not hard at all  Food Insecurity: No Food Insecurity (06/22/2023)   Hunger Vital Sign    Worried About Running Out of Food in the Last Year: Never true    Ran Out of Food in the Last Year: Never true  Transportation Needs: No Transportation Needs (06/22/2023)   PRAPARE - Administrator, Civil Service (Medical): No    Lack of Transportation (Non-Medical): No  Physical Activity: Not on file  Stress: No Stress Concern Present (04/24/2022)   [employer] of Occupational Health - Occupational Stress Questionnaire    Feeling of Stress : Not at all  Social Connections: Socially Isolated (04/24/2022)   Social Connection and Isolation Panel [NHANES]    Frequency of Communication with Friends and Family: Three times a week    Frequency of Social  Gatherings with Friends and Family: Three times a week    Attends Religious Services: Never    Active Member of Clubs or Organizations: No    Attends Banker Meetings: Never    Marital Status: Divorced  Catering manager Violence: Not At Risk (04/24/2022)   Humiliation, Afraid, Rape, and Kick questionnaire    Fear of Current or Ex-Partner: No    Emotionally Abused: No    Physically Abused: No    Sexually Abused: No    Family History  Problem Relation Age of Onset   Cancer Mother        lung   Kidney disease Mother    Lung cancer Maternal Uncle    Breast cancer Maternal Grandmother    Liver cancer Maternal Grandfather    Heart disease Brother    Depression Brother    Diabetes Father    Dementia Father    Colon cancer Neg Hx      Review of Systems  Constitutional: Negative.  Negative for chills and fever.  HENT: Negative.  Negative for congestion and sore throat.   Respiratory: Negative.  Negative for cough and shortness of breath.   Cardiovascular:  Positive for leg swelling.  Gastrointestinal:  Negative for abdominal pain, diarrhea, nausea and vomiting.  Genitourinary: Negative.   Skin: Negative.   Neurological: Negative.  Negative for dizziness and headaches.  All other systems reviewed and are negative.   Today's Vitals   08/16/23 1308  BP: (!) 160/82  Temp: 97.8 F (36.6 C)  TempSrc: Oral  Weight: 165 lb (74.8 kg)   Body mass index is 35.71 kg/m.   Physical Exam Vitals reviewed.  Constitutional:      Appearance: Normal appearance.  HENT:     Head: Normocephalic.     Mouth/Throat:     Mouth: Mucous membranes are moist.     Pharynx: Oropharynx is clear.  Eyes:  Extraocular Movements: Extraocular movements intact.     Pupils: Pupils are equal, round, and reactive to light.  Cardiovascular:     Rate and Rhythm: Normal rate and regular rhythm.     Pulses: Normal pulses.     Heart sounds: Normal heart sounds.  Pulmonary:     Effort:  Pulmonary effort is normal.     Breath sounds: Normal breath sounds.  Abdominal:     Palpations: Abdomen is soft.     Tenderness: There is no abdominal tenderness.  Musculoskeletal:     Right lower leg: Edema present.     Left lower leg: Edema present.  Skin:    General: Skin is warm and dry.     Capillary Refill: Capillary refill takes less than 2 seconds.  Neurological:     General: No focal deficit present.     Mental Status: She is alert and oriented to person, place, and time.  Psychiatric:        Mood and Affect: Mood normal.        Behavior: Behavior normal.    Results for orders placed or performed in visit on 08/16/23 (from the past 24 hour(s))  POCT HgB A1C     Status: Abnormal   Collection Time: 08/16/23  1:43 PM  Result Value Ref Range   Hemoglobin A1C 8.9 (A) 4.0 - 5.6 %   HbA1c POC (<> result, manual entry)     HbA1c, POC (prediabetic range)     HbA1c, POC (controlled diabetic range)       ASSESSMENT & PLAN: A total of 46 minutes was spent with the patient and counseling/coordination of care regarding preparing for this visit, review of most recent office visit notes, review of multiple chronic medical conditions under management, review of all medications and changes made, review of most recent blood work results including interpretation of today's hemoglobin A1c, education on nutrition, prognosis, documentation, and need for follow-up.  Problem List Items Addressed This Visit       Cardiovascular and Mediastinum   Hypertension associated with diabetes (HCC)    BP Readings from Last 3 Encounters:  08/16/23 (!) 160/82  07/26/23 132/68  06/30/23 (!) 144/90  Elevated blood pressure reading in the office but normal readings at home. Continue Cardizem 360 mg daily Elevated hemoglobin A1c.  Has not been consistent with insulin at home. Lab Results  Component Value Date   HGBA1C 8.9 (A) 08/16/2023  Advised to increase Lantus morning insulin to 24 units Sliding  scale provided for Premeal lispro insulin Cardiovascular risks associated with uncontrolled diabetes discussed Diet and nutrition discussed        Relevant Medications   furosemide (LASIX) 40 MG tablet   Other Relevant Orders   POCT HgB A1C (Completed)     Endocrine   Hyperlipidemia associated with type 2 diabetes mellitus (HCC)    Diet and nutrition discussed Continues atorvastatin 40 mg daily      Relevant Medications   furosemide (LASIX) 40 MG tablet     Genitourinary   CKD (chronic kidney disease) stage 5, GFR less than 15 ml/min (HCC)    Scheduled to see nephrologist next week No indication for emergency dialysis but will need to start getting dialysis soon Should be considered kidney transplant recipient        Other   Peripheral edema - Primary    Still making significant amounts of urine daily Has been taking furosemide 40 mg 3 times a week Recommend to start taking furosemide  40 mg daily Diet and nutrition discussed      Relevant Medications   furosemide (LASIX) 40 MG tablet   Gait instability    Prescription for rollator provided      Relevant Orders   For home use only DME 4 wheeled rolling walker with seat ([id number])   Patient Instructions  Edema  Edema is when you have too much fluid in your body or under your skin. Edema may make your legs, feet, and ankles swell. Swelling often happens in looser tissues, such as around your eyes. This is a common condition. It gets more common as you get older. There are many possible causes of edema. These include: Eating too much salt (sodium). Being on your feet or sitting for a long time. Certain medical conditions, such as: Pregnancy. Heart failure. Liver disease. Kidney disease. Cancer. Hot weather may make edema worse. Edema is usually painless. Your skin may look swollen or shiny. Follow these instructions at home: Medicines Take over-the-counter and prescription medicines only as told by your  doctor. Your doctor may prescribe a medicine to help your body get rid of extra water (diuretic). Take this medicine if you are told to take it. Eating and drinking Eat a low-salt (low-sodium) diet as told by your doctor. Sometimes, eating less salt may reduce swelling. Depending on the cause of your swelling, you may need to limit how much fluid you drink (fluid restriction). General instructions Raise the injured area above the level of your heart while you are sitting or lying down. Do not sit still or stand for a long time. Do not wear tight clothes. Do not wear garters on your upper legs. Exercise your legs. This can help the swelling go down. Wear compression stockings as told by your doctor. It is important that these are the right size. These should be prescribed by your doctor to prevent possible injuries. If elastic bandages or wraps are recommended, use them as told by your doctor. Contact a doctor if: Treatment is not working. You have heart, liver, or kidney disease and have symptoms of edema. You have sudden and unexplained weight gain. Get help right away if: You have shortness of breath or chest pain. You cannot breathe when you lie down. You have pain, redness, or warmth in the swollen areas. You have heart, liver, or kidney disease and get edema all of a sudden. You have a fever and your symptoms get worse all of a sudden. These symptoms may be an emergency. Get help right away. Call 911. Do not wait to see if the symptoms will go away. Do not drive yourself to the hospital. Summary Edema is when you have too much fluid in your body or under your skin. Edema may make your legs, feet, and ankles swell. Swelling often happens in looser tissues, such as around your eyes. Raise the injured area above the level of your heart while you are sitting or lying down. Follow your doctor's instructions about diet and how much fluid you can drink. This information is not intended to  replace advice given to you by your health care provider. Make sure you discuss any questions you have with your health care provider. Document Revised: 07/28/2021 Document Reviewed: 07/28/2021 Elsevier Patient Education  2024 Elsevier Inc. Diabetic Sliding Scale (I)  IF BLOOD SUGAR IS:  LESS THAN 100 ( NO INSULIN)  100-140 (2 UNITS)  141-180 (4 UNITS)  181-220 (6 UNITS)  221-260 (8 UNITS)  261-300 (10 UNITS)  301-340 (  12 UNITS)  MORE THAN 341 UNITS (14 UNITS)   Edwina Barth, MD [facility]

## 2023-08-16 NOTE — Assessment & Plan Note (Signed)
Scheduled to see nephrologist next week No indication for emergency dialysis but will need to start getting dialysis soon Should be considered kidney transplant recipient

## 2023-08-17 MED ORDER — GVOKE HYPOPEN 2-PACK 0.5 MG/0.1ML ~~LOC~~ SOAJ
0.5000 mg | SUBCUTANEOUS | 3 refills | Status: DC | PRN
Start: 2023-08-17 — End: 2024-03-14

## 2023-08-17 NOTE — Addendum Note (Signed)
Addended by: Evie Lacks on: 08/17/2023 08:04 AM   Modules accepted: Orders

## 2023-08-26 DIAGNOSIS — N185 Chronic kidney disease, stage 5: Secondary | ICD-10-CM | POA: Diagnosis not present

## 2023-08-26 DIAGNOSIS — R801 Persistent proteinuria, unspecified: Secondary | ICD-10-CM | POA: Diagnosis not present

## 2023-08-26 DIAGNOSIS — E1122 Type 2 diabetes mellitus with diabetic chronic kidney disease: Secondary | ICD-10-CM | POA: Diagnosis not present

## 2023-08-26 DIAGNOSIS — I120 Hypertensive chronic kidney disease with stage 5 chronic kidney disease or end stage renal disease: Secondary | ICD-10-CM | POA: Diagnosis not present

## 2023-08-26 DIAGNOSIS — N2581 Secondary hyperparathyroidism of renal origin: Secondary | ICD-10-CM | POA: Diagnosis not present

## 2023-08-27 LAB — LAB REPORT - SCANNED: EGFR: 3

## 2023-08-31 ENCOUNTER — Ambulatory Visit: Payer: Self-pay

## 2023-08-31 ENCOUNTER — Telehealth: Payer: Self-pay

## 2023-08-31 ENCOUNTER — Other Ambulatory Visit: Payer: Self-pay | Admitting: *Deleted

## 2023-08-31 DIAGNOSIS — R2681 Unsteadiness on feet: Secondary | ICD-10-CM

## 2023-08-31 DIAGNOSIS — R600 Localized edema: Secondary | ICD-10-CM

## 2023-08-31 NOTE — Telephone Encounter (Signed)
New order to DME has been placed. Will get provider signature

## 2023-08-31 NOTE — Patient Outreach (Signed)
Care Coordination   Follow Up Visit Note   08/31/2023 Name: Nancy [name] MRN: [id number] DOB: 09/02/1956  [name] is a 67 y.o. year old female who sees [name], [name], MD for primary care. I spoke with  [name] by phone today.  What matters to the patients health and wellness today?  [name] reports office visit with PCP on 08/16/23. She states has followed up with nephrologist and will have to begin HD and will have catheter placed on tomorrow. Per review of chart prescription for rollator walker provided th patient. However, [name] reports she did not get a prescription for rollator walker. Provided contact number to DME companies [facility] [phone], [facility] supply [phone] Per patient request, [facility] [phone]. [name] states she will call PCP to request prescription to pereferred DME provider. A1C 8.9 on 08/16/23 decreased from 11.5 on 05/19/23.  Goals Addressed             This Visit's Progress    Assist with health management       Interventions Today    Flowsheet Row Most Recent Value  Chronic Disease   Chronic disease during today's visit Diabetes, Chronic Kidney Disease/End Stage Renal Disease (ESRD)  General Interventions   General Interventions Discussed/Reviewed General Interventions Reviewed, Durable Medical Equipment (DME)  Durable Medical Equipment (DME) --  [provided contact number to DME company re: rollator walker]  Education Interventions   Education Provided Provided Education  Provided Verbal Education On When to see the doctor, Medication, Labs, Blood Sugar Monitoring  [advised to take medications as prescribed, attend provider visits as scheduled, advised contact provider with health questions/concerns. discusssed blood sugar management,]  Labs Reviewed Hgb A1c  [A1C 8.9 on 08/16/23 down from 11.5 on 05/19/23]  Pharmacy Interventions   Pharmacy Dicussed/Reviewed  Pharmacy Topics Reviewed  [medications reviewed. confirmed patient is taking medications per instructions from office visit on 08/16/23]            SDOH assessments and interventions completed:  No  Care Coordination Interventions:  Yes, provided   Follow up plan: Follow up call scheduled for 09/30/23    Encounter Outcome:  Patient Visit Completed   [name], RN, MSN, BSN, CCM Care Management Coordinator [phone]

## 2023-08-31 NOTE — Telephone Encounter (Signed)
Patient is requesting a prescription/ order to be placed for a walker with seat. She is having difficulty walking due to both legs and feet swelling. She was advised by her Dialysis team to contact her PCP for this order to be placed.  Please call patient in regard to this with an update.

## 2023-08-31 NOTE — Patient Instructions (Addendum)
Visit Information  Thank you for taking time to visit with me today. Please don't hesitate to contact me if I can be of assistance to you.   Following are the goals we discussed today:  Continue to take medications as prescribed. Continue to attend provider visits as scheduled Continue to eat healthy, lean meats, vegetables, fruits, avoid saturated and transfats Continue to check blood sugar as recommended and notify provider if questions or concerns Contact number for medical equipment:  discount medical [phone]; [facility] supply [phone];       Per patient request-[facility] [phone]   Our next appointment is by telephone on 09/30/23 at 10:00 am  Please call the care guide team at [phone] if you need to cancel or reschedule your appointment.   If you are experiencing a Mental Health or Behavioral Health Crisis or need someone to talk to, please call the Suicide and Crisis Lifeline: 988 call the [place] National Suicide Prevention Lifeline: 947-618-8832 or TTY: 907 694 7947 TTY 820 233 9718) to talk to a trained counselor call 1-800-273-TALK (toll free, 24 hour hotline)  [name], RN, MSN, BSN, CCM Care Management Coordinator [phone]

## 2023-09-01 DIAGNOSIS — N186 End stage renal disease: Secondary | ICD-10-CM | POA: Diagnosis not present

## 2023-09-01 DIAGNOSIS — Z992 Dependence on renal dialysis: Secondary | ICD-10-CM | POA: Diagnosis not present

## 2023-09-03 DIAGNOSIS — N186 End stage renal disease: Secondary | ICD-10-CM | POA: Diagnosis not present

## 2023-09-03 DIAGNOSIS — N2581 Secondary hyperparathyroidism of renal origin: Secondary | ICD-10-CM | POA: Diagnosis not present

## 2023-09-03 DIAGNOSIS — Z992 Dependence on renal dialysis: Secondary | ICD-10-CM | POA: Diagnosis not present

## 2023-09-06 DIAGNOSIS — N186 End stage renal disease: Secondary | ICD-10-CM | POA: Diagnosis not present

## 2023-09-06 DIAGNOSIS — N2581 Secondary hyperparathyroidism of renal origin: Secondary | ICD-10-CM | POA: Diagnosis not present

## 2023-09-06 DIAGNOSIS — Z992 Dependence on renal dialysis: Secondary | ICD-10-CM | POA: Diagnosis not present

## 2023-09-07 DIAGNOSIS — I120 Hypertensive chronic kidney disease with stage 5 chronic kidney disease or end stage renal disease: Secondary | ICD-10-CM | POA: Diagnosis not present

## 2023-09-07 DIAGNOSIS — N186 End stage renal disease: Secondary | ICD-10-CM | POA: Diagnosis not present

## 2023-09-07 DIAGNOSIS — E8770 Fluid overload, unspecified: Secondary | ICD-10-CM | POA: Diagnosis not present

## 2023-09-07 DIAGNOSIS — Z992 Dependence on renal dialysis: Secondary | ICD-10-CM | POA: Diagnosis not present

## 2023-09-07 DIAGNOSIS — N2581 Secondary hyperparathyroidism of renal origin: Secondary | ICD-10-CM | POA: Diagnosis not present

## 2023-09-09 DIAGNOSIS — N186 End stage renal disease: Secondary | ICD-10-CM | POA: Diagnosis not present

## 2023-09-09 DIAGNOSIS — Z992 Dependence on renal dialysis: Secondary | ICD-10-CM | POA: Diagnosis not present

## 2023-09-09 DIAGNOSIS — E8770 Fluid overload, unspecified: Secondary | ICD-10-CM | POA: Diagnosis not present

## 2023-09-09 DIAGNOSIS — N2581 Secondary hyperparathyroidism of renal origin: Secondary | ICD-10-CM | POA: Diagnosis not present

## 2023-09-09 DIAGNOSIS — I120 Hypertensive chronic kidney disease with stage 5 chronic kidney disease or end stage renal disease: Secondary | ICD-10-CM | POA: Diagnosis not present

## 2023-09-10 DIAGNOSIS — Z992 Dependence on renal dialysis: Secondary | ICD-10-CM | POA: Diagnosis not present

## 2023-09-10 DIAGNOSIS — N2581 Secondary hyperparathyroidism of renal origin: Secondary | ICD-10-CM | POA: Diagnosis not present

## 2023-09-10 DIAGNOSIS — E8770 Fluid overload, unspecified: Secondary | ICD-10-CM | POA: Diagnosis not present

## 2023-09-10 DIAGNOSIS — I120 Hypertensive chronic kidney disease with stage 5 chronic kidney disease or end stage renal disease: Secondary | ICD-10-CM | POA: Diagnosis not present

## 2023-09-10 DIAGNOSIS — N186 End stage renal disease: Secondary | ICD-10-CM | POA: Diagnosis not present

## 2023-09-13 ENCOUNTER — Ambulatory Visit: Payer: 59 | Admitting: Emergency Medicine

## 2023-09-13 DIAGNOSIS — N2581 Secondary hyperparathyroidism of renal origin: Secondary | ICD-10-CM | POA: Diagnosis not present

## 2023-09-13 DIAGNOSIS — Z992 Dependence on renal dialysis: Secondary | ICD-10-CM | POA: Diagnosis not present

## 2023-09-13 DIAGNOSIS — N186 End stage renal disease: Secondary | ICD-10-CM | POA: Diagnosis not present

## 2023-09-13 DIAGNOSIS — E8770 Fluid overload, unspecified: Secondary | ICD-10-CM | POA: Diagnosis not present

## 2023-09-13 DIAGNOSIS — I120 Hypertensive chronic kidney disease with stage 5 chronic kidney disease or end stage renal disease: Secondary | ICD-10-CM | POA: Diagnosis not present

## 2023-09-14 DIAGNOSIS — N2581 Secondary hyperparathyroidism of renal origin: Secondary | ICD-10-CM | POA: Diagnosis not present

## 2023-09-14 DIAGNOSIS — N186 End stage renal disease: Secondary | ICD-10-CM | POA: Diagnosis not present

## 2023-09-14 DIAGNOSIS — E8770 Fluid overload, unspecified: Secondary | ICD-10-CM | POA: Diagnosis not present

## 2023-09-14 DIAGNOSIS — I120 Hypertensive chronic kidney disease with stage 5 chronic kidney disease or end stage renal disease: Secondary | ICD-10-CM | POA: Diagnosis not present

## 2023-09-14 DIAGNOSIS — Z992 Dependence on renal dialysis: Secondary | ICD-10-CM | POA: Diagnosis not present

## 2023-09-15 ENCOUNTER — Encounter: Payer: Self-pay | Admitting: Emergency Medicine

## 2023-09-15 ENCOUNTER — Ambulatory Visit (INDEPENDENT_AMBULATORY_CARE_PROVIDER_SITE_OTHER): Payer: 59 | Admitting: Emergency Medicine

## 2023-09-15 VITALS — BP 134/84 | HR 54 | Temp 98.2°F | Ht <= 58 in | Wt 153.0 lb

## 2023-09-15 DIAGNOSIS — N185 Chronic kidney disease, stage 5: Secondary | ICD-10-CM | POA: Diagnosis not present

## 2023-09-15 DIAGNOSIS — E1169 Type 2 diabetes mellitus with other specified complication: Secondary | ICD-10-CM

## 2023-09-15 DIAGNOSIS — R600 Localized edema: Secondary | ICD-10-CM | POA: Diagnosis not present

## 2023-09-15 DIAGNOSIS — I1311 Hypertensive heart and chronic kidney disease without heart failure, with stage 5 chronic kidney disease, or end stage renal disease: Secondary | ICD-10-CM | POA: Diagnosis not present

## 2023-09-15 DIAGNOSIS — E1159 Type 2 diabetes mellitus with other circulatory complications: Secondary | ICD-10-CM | POA: Diagnosis not present

## 2023-09-15 DIAGNOSIS — I152 Hypertension secondary to endocrine disorders: Secondary | ICD-10-CM | POA: Diagnosis not present

## 2023-09-15 DIAGNOSIS — Z794 Long term (current) use of insulin: Secondary | ICD-10-CM | POA: Diagnosis not present

## 2023-09-15 DIAGNOSIS — E785 Hyperlipidemia, unspecified: Secondary | ICD-10-CM

## 2023-09-15 NOTE — Patient Instructions (Signed)
Dialysis Dialysis is a procedure that is done when the kidneys have stopped working properly (kidney failure). It may also be done earlier if it may help improve symptoms. During dialysis, wastes, salt, and extra water are removed from the blood, and the levels of certain minerals in the blood are maintained. Dialysis is done in sessions that are continued until the kidneys get better. If the kidneys cannot get better, such as in end-stage kidney disease, dialysis is continued for life or until you receive a new kidney from a donor (kidney transplant). Types of treatments There are two types of dialysis: hemodialysis and peritoneal dialysis. Both types of dialysis have advantages and disadvantages. Talk with your health care provider about which type of dialysis is best for you. Your lifestyle, preferences, and medical condition will be considered. In some cases, only one type of dialysis can be chosen. Hemodialysis Hemodialysis is when blood is filtered with a machine and a filter called a dialyzer. This treatment is usually done at a hospital or dialysis center three times per week. Visits last about 3-5 hours. Before you start hemodialysis treatment, you will have minor surgery to create a vascular access point. Your vascular access will be where you'll connect to the dialyzer. Home hemodialysis may also be an option for some patients. This means that hemodialysis can be performed at home with the help of a family member or caregiver who has had special training. Peritoneal dialysis Peritoneal dialysis is when the thin lining of the abdomen (peritoneum) and a fluid called dialysate are used to filter the blood. Before you start peritoneal dialysis, a surgeon places a soft tube, called a catheter, in your belly. You may do peritoneal dialysis at home or at almost any other location. After training, most people can perform both types of peritoneal dialysis on their own. You may need up to five  exchanges a day. Each exchange takes about 30-40 minutes. The amount of time the dialysate is in your body between exchanges is called a dwell. The dwell usually lasts 1.5-3 hours and can vary with each person. You may choose to do exchanges at night while you sleep, using a machine called a cycler. What are the advantages? Advantages of hemodialysis It is done less often than peritoneal dialysis. Someone else can do the dialysis for you. If you go to a dialysis center: Your health care provider can recognize any problems you may be having. You can interact with others who are having dialysis. This can provide you with emotional support. Advantages of peritoneal dialysis It is less likely than hemodialysis to cause cramps and low blood pressure. There are fewer eating restrictions than with hemodialysis. You may do exchanges on your own wherever you are, including when you travel. If you do automated peritoneal dialysis, you can set up your cycler every night. What are the disadvantages Disadvantages of hemodialysis Generally, hemodialysis and peritoneal dialysis are safe treatments. However, problems may occur. Cramps and low blood pressure. It may leave you feeling tired on the days you have the treatment. The need to make weekly appointments and work around the center's schedule, if you go to a dialysis center. The need to take extra care when traveling. If you usually get treatment in a dialysis center, you will need to arrange to visit a dialysis center near your destination. If you are having treatments at home, you will need to take the dialyzer with you when traveling. More eating restrictions than with peritoneal dialysis. Disadvantages of peritoneal  dialysis More frequent treatments than with hemodialysis. The need to have a good use (dexterity) of your hands. You must also be able to lift bags. The need to learn how to make your equipment free of germs (sterilization techniques).  You will need to use these techniques every day to prevent infection. What happens before the treatment? Hemodialysis Before starting hemodialysis, you will have minor surgery to create a site where blood can be removed from your body and returned to your body (vascular access). There are three types of vascular accesses: Arteriovenous (AV) fistula. This type of access is created when an artery and a vein (usually in the arm) are connected during surgery. The AV fistula also has a large diameter that allows your blood to flow out and back into your body quickly. The goal is to allow high blood flow so that the largest amount of blood can pass through the dialyzer. The arteriovenous fistula usually takes 1-6 months to develop after surgery. It may last longer than the other types of vascular accesses and is less likely to become infected or cause blood clots. Arteriovenous graft. If problems with your veins prevent you from having an AV fistula, you may need an AV graft instead. This type of access is created when an artery and a vein in the arm are connected during surgery with a tube. An arteriovenous graft can usually be used within 2-3 weeks of surgery. A venous catheter. To create this type of access, a thin tube (catheter) is placed in a large vein in your neck, chest, or groin. A venous catheter can be used right away. It is usually used as a short-term access when dialysis needs to be started right away. Peritoneal dialysis Before starting peritoneal dialysis, you will have surgery to place a catheter in your abdomen. The catheter will be used to transfer a solution called dialysate to and from your abdomen. What happens during the treatment? Hemodialysis During hemodialysis, blood will leave your body through your vascular access site. Blood will travel through a tube to the dialyzer, where it is filtered. The blood will then return to your body through a different tube. Peritoneal dialysis         At the start of a dialysis session, your abdomen will be filled with dialysate solution. During dialysis, waste, salt, and extra water in the blood will pass through the peritoneum and into the dialysate solution. The dialysate solution will be drained from the body at the end of the dialysis session. The process of filling and draining the dialysate is called an exchange. Exchanges are repeated until you have used up all of the dialysate solution for that day. Follow these instructions at home: Eating and drinking Make changes to your diet as told by your health care provider, such as: Limiting your intake of foods that contain a lot of phosphorus and potassium. Limiting your fluid and salt intake. Add protein to your diet because dialysis removes protein. Work with a diet and nutrition specialist (dietitian) to make a meal plan that can help improve your dialysis and your health and healthy ways to add calories to your diet because you may not have a good appetite. Take vitamins made for people with kidney failure. Activity Rest as told by your health care provider. You may have less energy. You may need to limit some physical activities when your belly is full of dialysis solution. Return to your normal activities as told by your health care provider. Ask your  health care provider what activities are safe for you. General instructions Try to adjust to the dialysis treatment, schedule, and diet. This can take some time. You may need to stop working and may not be able to do some of your normal activities. You may feel anxious or depressed when starting dialysis. Over time, many people feel better overall because of dialysis. You may be able to return to work after making some changes, such as reducing work intensity. Take over-the-counter and prescription medicines only as told by your health care provider. Keep all follow-up visits. This is important. Where to find more  information National Kidney Foundation: www.kidney.org American Association of Kidney Patients: [employer] Fund: www.kidneyfund.org Summary During dialysis, wastes, salt, and extra water are removed from the blood, and the levels of certain minerals in the blood are maintained. There are two types of dialysis: hemodialysis and peritoneal dialysis. Hemodialysis is when the blood is filtered with a machine and a filter called a dialyzer. Peritoneal dialysis is when the peritoneum is used as a filter. You may do peritoneal dialysis at home or at almost any other location. Both types of dialysis have advantages and disadvantages. Talk with your health care provider about which type of dialysis is best for you. This information is not intended to replace advice given to you by your health care provider. Make sure you discuss any questions you have with your health care provider. Document Revised: 06/18/2020 Document Reviewed: 06/18/2020 Elsevier Patient Education  2024 [employer].

## 2023-09-15 NOTE — Assessment & Plan Note (Signed)
Recently started on hemodialysis.  Tolerating procedure well No complications

## 2023-09-15 NOTE — Assessment & Plan Note (Addendum)
BP Readings from Last 3 Encounters:  09/15/23 134/84  08/16/23 (!) 160/82  07/26/23 132/68  Well-controlled hypertension Continues diltiazem 360 mg daily Well-controlled diabetes on insulin regimen Takes Premeal lispro insulin as per sliding scale Recently started on hemodialysis.  Tolerating it well and feeling a lot better.

## 2023-09-15 NOTE — Progress Notes (Signed)
Nancy [name] 67 y.o.   Chief Complaint  Patient presents with   Medical Management of Chronic Issues    Follow up and wants an order for a walker and an increase dosage of her flexeril     HISTORY OF PRESENT ILLNESS: This is a 67 y.o. female here for 4-week follow-up of peripheral edema and hypertension History of end-stage renal disease on renal transplant list.  Just started hemodialysis.  Working well.  Feeling a lot better. Peripheral edema gone.  Blood pressure well-controlled. Has no complaints or any other medical concerns today.  HPI   Prior to Admission medications   Medication Sig Start Date End Date Taking? Authorizing Provider  ACCU-CHEK FASTCLIX LANCETS MISC Use to check blood sugar up to 3 times a day 10/11/18  Yes Eulah Pont, MD  ACCU-CHEK GUIDE test strip CHECK BLOOD SUGAR 3 TIMES A DAY 03/30/23  Yes Georgina Quint, MD  atorvastatin (LIPITOR) 40 MG tablet TAKE 1 TABLET BY MOUTH EVERY DAY 03/30/23  Yes Kynsley Whitehouse, [name], MD  Blood Glucose Monitoring Suppl (ACCU-CHEK GUIDE) w/Device KIT 1 each by Does not apply route 3 (three) times daily. Check blood sugar 3 times a day 06/19/23  Yes Arrien, York Ram, MD  Blood Pressure Monitoring (ADVANCED ONE STEP BP MONITOR) MISC 1 Units by Does not apply route daily. 07/26/23  Yes Chandrasekhar, Mahesh A, MD  Cholecalciferol (VITAMIN D3) 25 MCG (1000 UT) CAPS Take 1 capsule by mouth daily.   Yes [provider]  Continuous Glucose Receiver (DEXCOM G7 RECEIVER) DEVI To check blood sugars twice a day 06/23/23  Yes Jalynn Waddell, [name], MD  Continuous Glucose Sensor (DEXCOM G7 SENSOR) MISC Use to check blood sugars twice a day 06/23/23  Yes Saleena Tamas, [name], MD  cyclobenzaprine (FLEXERIL) 10 MG tablet TAKE 1 TABLET BY MOUTH EVERYDAY AT BEDTIME 07/12/23  Yes Corwin Levins, MD  diclofenac Sodium (VOLTAREN) 1 % GEL Apply 2 g topically 4 (four) times daily. 01/06/22  Yes Adron Bene, MD  diltiazem (CARDIZEM  CD) 360 MG 24 hr capsule Take 1 capsule (360 mg total) by mouth daily. 06/30/23  Yes Chandrasekhar, Mahesh A, MD  ezetimibe (ZETIA) 10 MG tablet Take 1 tablet (10 mg total) by mouth daily. 07/26/23 10/24/23 Yes Chandrasekhar, Mahesh A, MD  furosemide (LASIX) 40 MG tablet Take 1 tablet (40 mg total) by mouth daily. TAKE 1 TABLET BY MOUTH THREE TIMES PER WEEK 08/16/23 11/14/23 Yes Stellarose Cerny, [name], MD  Glucagon (GVOKE HYPOPEN 2-PACK) 0.5 MG/0.1ML SOAJ Inject 0.5 mg into the skin as needed. 08/17/23  Yes Albertia Carvin, [name], MD  insulin lispro (HUMALOG) 100 UNIT/ML KwikPen For sugar 120 to 150 use one unit, for 151 to 200 use two units, for 201 to 250 use three units, for 251 to 300 use five units, for 301 to 350 use seven units, for 351 to 400 use 9 units, if sugar more than 400 call your primary care provider. 06/19/23  Yes Arrien, York Ram, MD  Insulin Pen Needle (PEN NEEDLES) 31G X 5 MM MISC 1 each by Does not apply route 3 (three) times daily with meals. May substitute to any manufacturer covered by patient's insurance. 06/19/23  Yes Arrien, York Ram, MD  Insulin Syringe-Needle U-100 31G X 15/64" 0.5 ML MISC Use to inject insulin one time a day 06/19/23  Yes Arrien, York Ram, MD  Insulin Syringes, Disposable, U-100 1 ML MISC 2 Syringes by Does not apply route daily. Use to inject insulin into  the skin 1 time daily. diag code E11.9. Insulin dependent 06/19/23  Yes Arrien, York Ram, MD  omeprazole (PRILOSEC) 40 MG capsule TAKE 1 CAPSULE BY MOUTH EVERY DAY 03/30/23  Yes Ariele Vidrio, [name], MD  senna (SENOKOT) 8.6 MG tablet Take 1 tablet (8.6 mg total) by mouth as needed for constipation. 07/08/20  Yes Briscoe Burns, MD  insulin glargine (LANTUS) 100 UNIT/ML injection Inject 0.1 mLs (10 Units total) into the skin daily. 06/19/23 07/19/23  Arrien, York Ram, MD    Allergies  Allergen Reactions   Ace Inhibitors Other (See Comments) and Cough    Persistent dry cough    Amitriptyline Hcl Nausea And Vomiting and Other (See Comments)    GI Intolerance/Upset stomach   Aspirin Other (See Comments)    GI Intolerance   Latex Itching   Prednisone Nausea And Vomiting    Patient Active Problem List   Diagnosis Date Noted   Gait instability 08/16/2023   Hypertensive heart and chronic kidney disease stage 5 (HCC) 06/30/2023   Chronic diastolic CHF (congestive heart failure) (HCC) 06/18/2023   Class 1 obesity 06/18/2023   Dyspnea on exertion 05/19/2023   Uncontrolled type 2 diabetes mellitus with hyperglycemia (HCC) 05/19/2023   Malnutrition of moderate degree 04/15/2023   Hyperglycemia 04/13/2023   CKD (chronic kidney disease) stage 5, GFR less than 15 ml/min (HCC) 03/09/2023   Bilateral hearing loss due to cerumen impaction 01/14/2023   Stage 4 chronic kidney disease (HCC) 02/09/2022   Hyperlipidemia associated with type 2 diabetes mellitus (HCC) 04/20/2019   Vaginal atrophy 07/22/2018   Morbid obesity (HCC) 08/04/2017   Peripheral edema 12/18/2014   GERD (gastroesophageal reflux disease) 01/16/2014   Carpal tunnel syndrome, bilateral    Hypertension associated with diabetes (HCC) 11/09/2006   Controlled diabetes mellitus with retinopathy (HCC) 12/07/2002    Past Medical History:  Diagnosis Date   Carpal tunnel syndrome, bilateral    confirmed on nerve conduction studies   Chronic hepatitis C (HCC)    considered cured 06/2016 post retroviral therapy    Diabetes mellitus type 2, uncontrolled    Diabetic peripheral neuropathy (HCC)    GERD (gastroesophageal reflux disease)    History of endometriosis    History of Helicobacter infection 11/07   Hyperlipidemia    Liver fibrosis 02/17/2016   F2/3     Past Surgical History:  Procedure Laterality Date   ABDOMINAL HYSTERECTOMY     CARPAL TUNNEL RELEASE      Social History   Socioeconomic History   Marital status: Single    Spouse name: Not on file   Number of children: 0   Years of education:  12   Highest education level: Not on file  Occupational History   Occupation: Scientist, research (medical): PERSONAL CARE   Occupation: Disabled  Tobacco Use   Smoking status: Never   Smokeless tobacco: Never  Vaping Use   Vaping status: Never Used  Substance and Sexual Activity   Alcohol use: No    Alcohol/week: 0.0 standard drinks of alcohol   Drug use: No   Sexual activity: Yes    Birth control/protection: None    Comment: 1 committed partner  Other Topics Concern   Not on file  Social History Narrative   Current Social History 08/28/2019        Patient lives alone in a 7th floor apartment with elevator. There are not steps up to the entrance the patient uses.       Patient's  method of transportation is via family member (mom or friend).      The highest level of education was high school diploma.      The patient currently disabled.      Identified important Relationships are "My mother"       Pets : None       Interests / Fun: "TV, movies"       Current Stressors: "People, my health"       Religious / Personal Beliefs: "Baptist"       L. Leward Quan, RN, BSN       Social Determinants of Health   Financial Resource Strain: Low Risk  (04/24/2022)   Overall Financial Resource Strain (CARDIA)    Difficulty of Paying Living Expenses: Not hard at all  Food Insecurity: No Food Insecurity (06/22/2023)   Hunger Vital Sign    Worried About Running Out of Food in the Last Year: Never true    Ran Out of Food in the Last Year: Never true  Transportation Needs: No Transportation Needs (06/22/2023)   PRAPARE - Administrator, Civil Service (Medical): No    Lack of Transportation (Non-Medical): No  Physical Activity: Not on file  Stress: No Stress Concern Present (04/24/2022)   [employer] of Occupational Health - Occupational Stress Questionnaire    Feeling of Stress : Not at all  Social Connections: Socially Isolated (04/24/2022)   Social Connection and Isolation Panel  [NHANES]    Frequency of Communication with Friends and Family: Three times a week    Frequency of Social Gatherings with Friends and Family: Three times a week    Attends Religious Services: Never    Active Member of Clubs or Organizations: No    Attends Banker Meetings: Never    Marital Status: Divorced  Catering manager Violence: Not At Risk (04/24/2022)   Humiliation, Afraid, Rape, and Kick questionnaire    Fear of Current or Ex-Partner: No    Emotionally Abused: No    Physically Abused: No    Sexually Abused: No    Family History  Problem Relation Age of Onset   Cancer Mother        lung   Kidney disease Mother    Lung cancer Maternal Uncle    Breast cancer Maternal Grandmother    Liver cancer Maternal Grandfather    Heart disease Brother    Depression Brother    Diabetes Father    Dementia Father    Colon cancer Neg Hx      Review of Systems  Constitutional: Negative.  Negative for chills and fever.  HENT: Negative.  Negative for congestion and sore throat.   Respiratory: Negative.  Negative for cough and shortness of breath.   Cardiovascular: Negative.  Negative for chest pain and palpitations.  Gastrointestinal:  Negative for abdominal pain, diarrhea, nausea and vomiting.  Genitourinary: Negative.  Negative for dysuria and hematuria.  Skin: Negative.  Negative for rash.  Neurological: Negative.  Negative for dizziness and headaches.  All other systems reviewed and are negative.   Vitals:   09/15/23 1004  BP: 134/84  Pulse: (!) 54  Temp: 98.2 F (36.8 C)  SpO2: 95%    Physical Exam Vitals reviewed.  Constitutional:      Appearance: Normal appearance.  HENT:     Head: Normocephalic.     Mouth/Throat:     Mouth: Mucous membranes are moist.     Pharynx: Oropharynx is clear.  Eyes:  Extraocular Movements: Extraocular movements intact.     Pupils: Pupils are equal, round, and reactive to light.  Cardiovascular:     Rate and Rhythm:  Normal rate and regular rhythm.     Pulses: Normal pulses.     Heart sounds: Normal heart sounds.  Pulmonary:     Effort: Pulmonary effort is normal.     Breath sounds: Normal breath sounds.  Abdominal:     Palpations: Abdomen is soft.     Tenderness: There is no abdominal tenderness.  Musculoskeletal:     Cervical back: No tenderness.     Right lower leg: No edema.     Left lower leg: No edema.  Lymphadenopathy:     Cervical: No cervical adenopathy.  Skin:    General: Skin is warm and dry.     Capillary Refill: Capillary refill takes less than 2 seconds.  Neurological:     General: No focal deficit present.     Mental Status: She is alert and oriented to person, place, and time.  Psychiatric:        Mood and Affect: Mood normal.        Behavior: Behavior normal.      ASSESSMENT & PLAN: A total of 44 minutes was spent with the patient and counseling/coordination of care regarding preparing for this visit, review of most recent office visit notes, review of multiple chronic medical conditions under management, review of all medications, review of most recent blood work results, education on nutrition, prognosis, documentation, and need for follow-up.  Problem List Items Addressed This Visit       Cardiovascular and Mediastinum   Hypertension associated with diabetes (HCC)    Well-controlled hypertension and diabetes      Hypertensive heart and chronic kidney disease stage 5 (HCC) - Primary    BP Readings from Last 3 Encounters:  09/15/23 134/84  08/16/23 (!) 160/82  07/26/23 132/68  Well-controlled hypertension Continues diltiazem 360 mg daily Well-controlled diabetes on insulin regimen Takes Premeal lispro insulin as per sliding scale Recently started on hemodialysis.  Tolerating it well and feeling a lot better.       Relevant Orders   Walker rolling     Endocrine   Hyperlipidemia associated with type 2 diabetes mellitus (HCC)    Stable chronic  condition Continues Zetia 10 mg and atorvastatin 40 mg daily      Relevant Orders   Walker rolling     Genitourinary   CKD (chronic kidney disease) stage 5, GFR less than 15 ml/min (HCC)    Recently started on hemodialysis.  Tolerating procedure well No complications        Other   Peripheral edema    No edema on exam today Normovolemic Much improved      Patient Instructions  Dialysis Dialysis is a procedure that is done when the kidneys have stopped working properly (kidney failure). It may also be done earlier if it may help improve symptoms. During dialysis, wastes, salt, and extra water are removed from the blood, and the levels of certain minerals in the blood are maintained. Dialysis is done in sessions that are continued until the kidneys get better. If the kidneys cannot get better, such as in end-stage kidney disease, dialysis is continued for life or until you receive a new kidney from a donor (kidney transplant). Types of treatments There are two types of dialysis: hemodialysis and peritoneal dialysis. Both types of dialysis have advantages and disadvantages. Talk with your health care  provider about which type of dialysis is best for you. Your lifestyle, preferences, and medical condition will be considered. In some cases, only one type of dialysis can be chosen. Hemodialysis Hemodialysis is when blood is filtered with a machine and a filter called a dialyzer. This treatment is usually done at a hospital or dialysis center three times per week. Visits last about 3-5 hours. Before you start hemodialysis treatment, you will have minor surgery to create a vascular access point. Your vascular access will be where you'll connect to the dialyzer. Home hemodialysis may also be an option for some patients. This means that hemodialysis can be performed at home with the help of a family member or caregiver who has had special training. Peritoneal dialysis Peritoneal dialysis is  when the thin lining of the abdomen (peritoneum) and a fluid called dialysate are used to filter the blood. Before you start peritoneal dialysis, a surgeon places a soft tube, called a catheter, in your belly. You may do peritoneal dialysis at home or at almost any other location. After training, most people can perform both types of peritoneal dialysis on their own. You may need up to five exchanges a day. Each exchange takes about 30-40 minutes. The amount of time the dialysate is in your body between exchanges is called a dwell. The dwell usually lasts 1.5-3 hours and can vary with each person. You may choose to do exchanges at night while you sleep, using a machine called a cycler. What are the advantages? Advantages of hemodialysis It is done less often than peritoneal dialysis. Someone else can do the dialysis for you. If you go to a dialysis center: Your health care provider can recognize any problems you may be having. You can interact with others who are having dialysis. This can provide you with emotional support. Advantages of peritoneal dialysis It is less likely than hemodialysis to cause cramps and low blood pressure. There are fewer eating restrictions than with hemodialysis. You may do exchanges on your own wherever you are, including when you travel. If you do automated peritoneal dialysis, you can set up your cycler every night. What are the disadvantages Disadvantages of hemodialysis Generally, hemodialysis and peritoneal dialysis are safe treatments. However, problems may occur. Cramps and low blood pressure. It may leave you feeling tired on the days you have the treatment. The need to make weekly appointments and work around the center's schedule, if you go to a dialysis center. The need to take extra care when traveling. If you usually get treatment in a dialysis center, you will need to arrange to visit a dialysis center near your destination. If you are having treatments  at home, you will need to take the dialyzer with you when traveling. More eating restrictions than with peritoneal dialysis. Disadvantages of peritoneal dialysis More frequent treatments than with hemodialysis. The need to have a good use (dexterity) of your hands. You must also be able to lift bags. The need to learn how to make your equipment free of germs (sterilization techniques). You will need to use these techniques every day to prevent infection. What happens before the treatment? Hemodialysis Before starting hemodialysis, you will have minor surgery to create a site where blood can be removed from your body and returned to your body (vascular access). There are three types of vascular accesses: Arteriovenous (AV) fistula. This type of access is created when an artery and a vein (usually in the arm) are connected during surgery. The AV fistula also  has a large diameter that allows your blood to flow out and back into your body quickly. The goal is to allow high blood flow so that the largest amount of blood can pass through the dialyzer. The arteriovenous fistula usually takes 1-6 months to develop after surgery. It may last longer than the other types of vascular accesses and is less likely to become infected or cause blood clots. Arteriovenous graft. If problems with your veins prevent you from having an AV fistula, you may need an AV graft instead. This type of access is created when an artery and a vein in the arm are connected during surgery with a tube. An arteriovenous graft can usually be used within 2-3 weeks of surgery. A venous catheter. To create this type of access, a thin tube (catheter) is placed in a large vein in your neck, chest, or groin. A venous catheter can be used right away. It is usually used as a short-term access when dialysis needs to be started right away. Peritoneal dialysis Before starting peritoneal dialysis, you will have surgery to place a catheter in your  abdomen. The catheter will be used to transfer a solution called dialysate to and from your abdomen. What happens during the treatment? Hemodialysis During hemodialysis, blood will leave your body through your vascular access site. Blood will travel through a tube to the dialyzer, where it is filtered. The blood will then return to your body through a different tube. Peritoneal dialysis        At the start of a dialysis session, your abdomen will be filled with dialysate solution. During dialysis, waste, salt, and extra water in the blood will pass through the peritoneum and into the dialysate solution. The dialysate solution will be drained from the body at the end of the dialysis session. The process of filling and draining the dialysate is called an exchange. Exchanges are repeated until you have used up all of the dialysate solution for that day. Follow these instructions at home: Eating and drinking Make changes to your diet as told by your health care provider, such as: Limiting your intake of foods that contain a lot of phosphorus and potassium. Limiting your fluid and salt intake. Add protein to your diet because dialysis removes protein. Work with a diet and nutrition specialist (dietitian) to make a meal plan that can help improve your dialysis and your health and healthy ways to add calories to your diet because you may not have a good appetite. Take vitamins made for people with kidney failure. Activity Rest as told by your health care provider. You may have less energy. You may need to limit some physical activities when your belly is full of dialysis solution. Return to your normal activities as told by your health care provider. Ask your health care provider what activities are safe for you. General instructions Try to adjust to the dialysis treatment, schedule, and diet. This can take some time. You may need to stop working and may not be able to do some of your normal  activities. You may feel anxious or depressed when starting dialysis. Over time, many people feel better overall because of dialysis. You may be able to return to work after making some changes, such as reducing work intensity. Take over-the-counter and prescription medicines only as told by your health care provider. Keep all follow-up visits. This is important. Where to find more information National Kidney Foundation: www.kidney.org American Association of Kidney Patients: ResidentialShow.is American Kidney Fund: www.kidneyfund.org  Summary During dialysis, wastes, salt, and extra water are removed from the blood, and the levels of certain minerals in the blood are maintained. There are two types of dialysis: hemodialysis and peritoneal dialysis. Hemodialysis is when the blood is filtered with a machine and a filter called a dialyzer. Peritoneal dialysis is when the peritoneum is used as a filter. You may do peritoneal dialysis at home or at almost any other location. Both types of dialysis have advantages and disadvantages. Talk with your health care provider about which type of dialysis is best for you. This information is not intended to replace advice given to you by your health care provider. Make sure you discuss any questions you have with your health care provider. Document Revised: 06/18/2020 Document Reviewed: 06/18/2020 Elsevier Patient Education  2024 Elsevier Inc.     Edwina Barth, MD [facility]

## 2023-09-15 NOTE — Assessment & Plan Note (Signed)
Stable chronic condition Continues Zetia 10 mg and atorvastatin 40 mg daily

## 2023-09-15 NOTE — Assessment & Plan Note (Signed)
No edema on exam today Normovolemic Much improved

## 2023-09-15 NOTE — Assessment & Plan Note (Signed)
Well-controlled hypertension and diabetes

## 2023-09-16 DIAGNOSIS — N2581 Secondary hyperparathyroidism of renal origin: Secondary | ICD-10-CM | POA: Diagnosis not present

## 2023-09-16 DIAGNOSIS — I120 Hypertensive chronic kidney disease with stage 5 chronic kidney disease or end stage renal disease: Secondary | ICD-10-CM | POA: Diagnosis not present

## 2023-09-16 DIAGNOSIS — E8770 Fluid overload, unspecified: Secondary | ICD-10-CM | POA: Diagnosis not present

## 2023-09-16 DIAGNOSIS — N186 End stage renal disease: Secondary | ICD-10-CM | POA: Diagnosis not present

## 2023-09-16 DIAGNOSIS — Z992 Dependence on renal dialysis: Secondary | ICD-10-CM | POA: Diagnosis not present

## 2023-09-17 DIAGNOSIS — I120 Hypertensive chronic kidney disease with stage 5 chronic kidney disease or end stage renal disease: Secondary | ICD-10-CM | POA: Diagnosis not present

## 2023-09-17 DIAGNOSIS — E8770 Fluid overload, unspecified: Secondary | ICD-10-CM | POA: Diagnosis not present

## 2023-09-17 DIAGNOSIS — Z992 Dependence on renal dialysis: Secondary | ICD-10-CM | POA: Diagnosis not present

## 2023-09-17 DIAGNOSIS — N186 End stage renal disease: Secondary | ICD-10-CM | POA: Diagnosis not present

## 2023-09-17 DIAGNOSIS — N2581 Secondary hyperparathyroidism of renal origin: Secondary | ICD-10-CM | POA: Diagnosis not present

## 2023-09-20 DIAGNOSIS — N2581 Secondary hyperparathyroidism of renal origin: Secondary | ICD-10-CM | POA: Diagnosis not present

## 2023-09-20 DIAGNOSIS — Z992 Dependence on renal dialysis: Secondary | ICD-10-CM | POA: Diagnosis not present

## 2023-09-20 DIAGNOSIS — E8770 Fluid overload, unspecified: Secondary | ICD-10-CM | POA: Diagnosis not present

## 2023-09-20 DIAGNOSIS — I120 Hypertensive chronic kidney disease with stage 5 chronic kidney disease or end stage renal disease: Secondary | ICD-10-CM | POA: Diagnosis not present

## 2023-09-20 DIAGNOSIS — N186 End stage renal disease: Secondary | ICD-10-CM | POA: Diagnosis not present

## 2023-09-21 DIAGNOSIS — Z992 Dependence on renal dialysis: Secondary | ICD-10-CM | POA: Diagnosis not present

## 2023-09-21 DIAGNOSIS — N186 End stage renal disease: Secondary | ICD-10-CM | POA: Diagnosis not present

## 2023-09-21 DIAGNOSIS — N2581 Secondary hyperparathyroidism of renal origin: Secondary | ICD-10-CM | POA: Diagnosis not present

## 2023-09-21 DIAGNOSIS — E8770 Fluid overload, unspecified: Secondary | ICD-10-CM | POA: Diagnosis not present

## 2023-09-21 DIAGNOSIS — I120 Hypertensive chronic kidney disease with stage 5 chronic kidney disease or end stage renal disease: Secondary | ICD-10-CM | POA: Diagnosis not present

## 2023-09-23 DIAGNOSIS — I120 Hypertensive chronic kidney disease with stage 5 chronic kidney disease or end stage renal disease: Secondary | ICD-10-CM | POA: Diagnosis not present

## 2023-09-23 DIAGNOSIS — Z992 Dependence on renal dialysis: Secondary | ICD-10-CM | POA: Diagnosis not present

## 2023-09-23 DIAGNOSIS — N186 End stage renal disease: Secondary | ICD-10-CM | POA: Diagnosis not present

## 2023-09-23 DIAGNOSIS — N2581 Secondary hyperparathyroidism of renal origin: Secondary | ICD-10-CM | POA: Diagnosis not present

## 2023-09-23 DIAGNOSIS — E8770 Fluid overload, unspecified: Secondary | ICD-10-CM | POA: Diagnosis not present

## 2023-09-24 DIAGNOSIS — Z992 Dependence on renal dialysis: Secondary | ICD-10-CM | POA: Diagnosis not present

## 2023-09-24 DIAGNOSIS — N2581 Secondary hyperparathyroidism of renal origin: Secondary | ICD-10-CM | POA: Diagnosis not present

## 2023-09-24 DIAGNOSIS — E8770 Fluid overload, unspecified: Secondary | ICD-10-CM | POA: Diagnosis not present

## 2023-09-24 DIAGNOSIS — N186 End stage renal disease: Secondary | ICD-10-CM | POA: Diagnosis not present

## 2023-09-24 DIAGNOSIS — I120 Hypertensive chronic kidney disease with stage 5 chronic kidney disease or end stage renal disease: Secondary | ICD-10-CM | POA: Diagnosis not present

## 2023-09-27 DIAGNOSIS — N186 End stage renal disease: Secondary | ICD-10-CM | POA: Diagnosis not present

## 2023-09-27 DIAGNOSIS — I120 Hypertensive chronic kidney disease with stage 5 chronic kidney disease or end stage renal disease: Secondary | ICD-10-CM | POA: Diagnosis not present

## 2023-09-27 DIAGNOSIS — E8770 Fluid overload, unspecified: Secondary | ICD-10-CM | POA: Diagnosis not present

## 2023-09-27 DIAGNOSIS — N2581 Secondary hyperparathyroidism of renal origin: Secondary | ICD-10-CM | POA: Diagnosis not present

## 2023-09-27 DIAGNOSIS — Z992 Dependence on renal dialysis: Secondary | ICD-10-CM | POA: Diagnosis not present

## 2023-09-28 DIAGNOSIS — N186 End stage renal disease: Secondary | ICD-10-CM | POA: Diagnosis not present

## 2023-09-28 DIAGNOSIS — I120 Hypertensive chronic kidney disease with stage 5 chronic kidney disease or end stage renal disease: Secondary | ICD-10-CM | POA: Diagnosis not present

## 2023-09-28 DIAGNOSIS — E8770 Fluid overload, unspecified: Secondary | ICD-10-CM | POA: Diagnosis not present

## 2023-09-28 DIAGNOSIS — Z992 Dependence on renal dialysis: Secondary | ICD-10-CM | POA: Diagnosis not present

## 2023-09-28 DIAGNOSIS — N2581 Secondary hyperparathyroidism of renal origin: Secondary | ICD-10-CM | POA: Diagnosis not present

## 2023-09-30 ENCOUNTER — Ambulatory Visit: Payer: Self-pay

## 2023-09-30 DIAGNOSIS — N186 End stage renal disease: Secondary | ICD-10-CM | POA: Diagnosis not present

## 2023-09-30 DIAGNOSIS — E8770 Fluid overload, unspecified: Secondary | ICD-10-CM | POA: Diagnosis not present

## 2023-09-30 DIAGNOSIS — Z992 Dependence on renal dialysis: Secondary | ICD-10-CM | POA: Diagnosis not present

## 2023-09-30 DIAGNOSIS — N2581 Secondary hyperparathyroidism of renal origin: Secondary | ICD-10-CM | POA: Diagnosis not present

## 2023-09-30 DIAGNOSIS — I120 Hypertensive chronic kidney disease with stage 5 chronic kidney disease or end stage renal disease: Secondary | ICD-10-CM | POA: Diagnosis not present

## 2023-09-30 NOTE — Patient Instructions (Signed)
Visit Information  Thank you for taking time to visit with me today. Please don't hesitate to contact me if I can be of assistance to you.   Following are the goals we discussed today:  Continue to take medications as prescribed. Continue to attend provider visits as scheduled Continue to eat healthy, lean meats, vegetables, fruits, avoid saturated and transfats Continue to check blood sugar as recommended and notify provider if questions or concerns  Our next appointment is by telephone on 10/06/23 at 10:00 am  Please call the care guide team at [phone] if you need to cancel or reschedule your appointment.   If you are experiencing a Mental Health or Behavioral Health Crisis or need someone to talk to, please call the Suicide and Crisis Lifeline: 988 call the [place] National Suicide Prevention Lifeline: 7858045334 or TTY: 423-437-5840 TTY (254)348-8239) to talk to a trained counselor call 1-800-273-TALK (toll free, 24 hour hotline)  [name], RN, MSN, BSN, CCM Care Management Coordinator [phone]

## 2023-09-30 NOTE — Patient Outreach (Signed)
Care Coordination   Follow Up Visit Note   09/30/2023 Name: Nancy [name] MRN: [id number] DOB: 1956-12-05  Nancy [name] is a 67 y.o. year old female who sees [name], [name], MD for primary care. I spoke with  Nancy [name] by phone today.  What matters to the patients health and wellness today?  Nancy [name] reports she is at HD. She reports everything is going along ok".  Goals Addressed             This Visit's Progress    Assist with health management       Interventions Today    Flowsheet Row Most Recent Value  Chronic Disease   Chronic disease during today's visit Hypertension (HTN), Chronic Kidney Disease/End Stage Renal Disease (ESRD), Diabetes  General Interventions   General Interventions Discussed/Reviewed General Interventions Reviewed, Doctor Visits  [Evaluation of current treatment plan for health condition and patient's adherence to plan.]  Doctor Visits Discussed/Reviewed Doctor Visits Discussed, PCP  PCP/Specialist Visits Compliance with follow-up visit  [discussed upcoming/scheduled appointments]            SDOH assessments and interventions completed:  No  Care Coordination Interventions:  Yes, provided   Follow up plan: Follow up call scheduled for 10/06/23    Encounter Outcome:  Patient Visit Completed   Nancy Sheriff, RN, MSN, BSN, CCM Care Management Coordinator [phone]

## 2023-10-01 DIAGNOSIS — E8770 Fluid overload, unspecified: Secondary | ICD-10-CM | POA: Diagnosis not present

## 2023-10-01 DIAGNOSIS — N186 End stage renal disease: Secondary | ICD-10-CM | POA: Diagnosis not present

## 2023-10-01 DIAGNOSIS — N2581 Secondary hyperparathyroidism of renal origin: Secondary | ICD-10-CM | POA: Diagnosis not present

## 2023-10-01 DIAGNOSIS — I120 Hypertensive chronic kidney disease with stage 5 chronic kidney disease or end stage renal disease: Secondary | ICD-10-CM | POA: Diagnosis not present

## 2023-10-01 DIAGNOSIS — Z992 Dependence on renal dialysis: Secondary | ICD-10-CM | POA: Diagnosis not present

## 2023-10-04 ENCOUNTER — Encounter: Payer: Self-pay | Admitting: Emergency Medicine

## 2023-10-04 ENCOUNTER — Ambulatory Visit (INDEPENDENT_AMBULATORY_CARE_PROVIDER_SITE_OTHER): Payer: 59 | Admitting: Emergency Medicine

## 2023-10-04 VITALS — BP 140/88 | HR 70 | Temp 98.0°F | Ht <= 58 in | Wt 148.0 lb

## 2023-10-04 DIAGNOSIS — N186 End stage renal disease: Secondary | ICD-10-CM | POA: Diagnosis not present

## 2023-10-04 DIAGNOSIS — I1311 Hypertensive heart and chronic kidney disease without heart failure, with stage 5 chronic kidney disease, or end stage renal disease: Secondary | ICD-10-CM

## 2023-10-04 DIAGNOSIS — E785 Hyperlipidemia, unspecified: Secondary | ICD-10-CM | POA: Diagnosis not present

## 2023-10-04 DIAGNOSIS — E8770 Fluid overload, unspecified: Secondary | ICD-10-CM | POA: Diagnosis not present

## 2023-10-04 DIAGNOSIS — N185 Chronic kidney disease, stage 5: Secondary | ICD-10-CM

## 2023-10-04 DIAGNOSIS — I152 Hypertension secondary to endocrine disorders: Secondary | ICD-10-CM

## 2023-10-04 DIAGNOSIS — Z794 Long term (current) use of insulin: Secondary | ICD-10-CM

## 2023-10-04 DIAGNOSIS — N2581 Secondary hyperparathyroidism of renal origin: Secondary | ICD-10-CM | POA: Diagnosis not present

## 2023-10-04 DIAGNOSIS — Z992 Dependence on renal dialysis: Secondary | ICD-10-CM | POA: Diagnosis not present

## 2023-10-04 DIAGNOSIS — E1169 Type 2 diabetes mellitus with other specified complication: Secondary | ICD-10-CM | POA: Diagnosis not present

## 2023-10-04 DIAGNOSIS — E1159 Type 2 diabetes mellitus with other circulatory complications: Secondary | ICD-10-CM

## 2023-10-04 DIAGNOSIS — I120 Hypertensive chronic kidney disease with stage 5 chronic kidney disease or end stage renal disease: Secondary | ICD-10-CM | POA: Diagnosis not present

## 2023-10-04 NOTE — Assessment & Plan Note (Signed)
Well-controlled hypertension Lab Results  Component Value Date   HGBA1C 8.9 (A) 08/16/2023  Continues insulin treatment No hypoglycemic episodes at home Stable sugars at home.

## 2023-10-04 NOTE — Progress Notes (Signed)
Nancy [name] 67 y.o.   Chief Complaint  Patient presents with   Medical Management of Chronic Issues    f/u appt, patient states she is feeling ok, patient is currently taking dialysis.     HISTORY OF PRESENT ILLNESS: This is a 67 y.o. female here for 59-month follow-up of chronic medical problems including end-stage renal disease on dialysis Overall doing much better.  Has no complaints or medical concerns today.  HPI   Prior to Admission medications   Medication Sig Start Date End Date Taking? Authorizing Provider  ACCU-CHEK FASTCLIX LANCETS MISC Use to check blood sugar up to 3 times a day 10/11/18  Yes Eulah Pont, MD  ACCU-CHEK GUIDE test strip CHECK BLOOD SUGAR 3 TIMES A DAY 03/30/23  Yes Georgina Quint, MD  atorvastatin (LIPITOR) 40 MG tablet TAKE 1 TABLET BY MOUTH EVERY DAY 03/30/23  Yes Quinnetta Roepke, [name], MD  Blood Glucose Monitoring Suppl (ACCU-CHEK GUIDE) w/Device KIT 1 each by Does not apply route 3 (three) times daily. Check blood sugar 3 times a day 06/19/23  Yes Arrien, York Ram, MD  Blood Pressure Monitoring (ADVANCED ONE STEP BP MONITOR) MISC 1 Units by Does not apply route daily. 07/26/23  Yes Chandrasekhar, Mahesh A, MD  Cholecalciferol (VITAMIN D3) 25 MCG (1000 UT) CAPS Take 1 capsule by mouth daily.   Yes [provider]  Continuous Glucose Receiver (DEXCOM G7 RECEIVER) DEVI To check blood sugars twice a day 06/23/23  Yes Favour Aleshire, [name], MD  Continuous Glucose Sensor (DEXCOM G7 SENSOR) MISC Use to check blood sugars twice a day 06/23/23  Yes Cameran Pettey, [name], MD  cyclobenzaprine (FLEXERIL) 10 MG tablet TAKE 1 TABLET BY MOUTH EVERYDAY AT BEDTIME 07/12/23  Yes Corwin Levins, MD  diclofenac Sodium (VOLTAREN) 1 % GEL Apply 2 g topically 4 (four) times daily. 01/06/22  Yes Adron Bene, MD  diltiazem (CARDIZEM CD) 360 MG 24 hr capsule Take 1 capsule (360 mg total) by mouth daily. 06/30/23  Yes Chandrasekhar, Mahesh A, MD  ezetimibe  (ZETIA) 10 MG tablet Take 1 tablet (10 mg total) by mouth daily. 07/26/23 10/24/23 Yes Chandrasekhar, Mahesh A, MD  furosemide (LASIX) 40 MG tablet Take 1 tablet (40 mg total) by mouth daily. TAKE 1 TABLET BY MOUTH THREE TIMES PER WEEK 08/16/23 11/14/23 Yes Rosana Farnell, [name], MD  Glucagon (GVOKE HYPOPEN 2-PACK) 0.5 MG/0.1ML SOAJ Inject 0.5 mg into the skin as needed. 08/17/23  Yes Delsin Copen, [name], MD  insulin lispro (HUMALOG) 100 UNIT/ML KwikPen For sugar 120 to 150 use one unit, for 151 to 200 use two units, for 201 to 250 use three units, for 251 to 300 use five units, for 301 to 350 use seven units, for 351 to 400 use 9 units, if sugar more than 400 call your primary care provider. 06/19/23  Yes Arrien, York Ram, MD  Insulin Pen Needle (PEN NEEDLES) 31G X 5 MM MISC 1 each by Does not apply route 3 (three) times daily with meals. May substitute to any manufacturer covered by patient's insurance. 06/19/23  Yes Arrien, York Ram, MD  Insulin Syringe-Needle U-100 31G X 15/64" 0.5 ML MISC Use to inject insulin one time a day 06/19/23  Yes Arrien, York Ram, MD  Insulin Syringes, Disposable, U-100 1 ML MISC 2 Syringes by Does not apply route daily. Use to inject insulin into the skin 1 time daily. diag code E11.9. Insulin dependent 06/19/23  Yes Arrien, York Ram, MD  omeprazole (PRILOSEC) 40 MG  capsule TAKE 1 CAPSULE BY MOUTH EVERY DAY 03/30/23  Yes Ronni Osterberg, [name], MD  senna (SENOKOT) 8.6 MG tablet Take 1 tablet (8.6 mg total) by mouth as needed for constipation. 07/08/20  Yes Briscoe Burns, MD  insulin glargine (LANTUS) 100 UNIT/ML injection Inject 0.1 mLs (10 Units total) into the skin daily. 06/19/23 07/19/23  Arrien, York Ram, MD    Allergies  Allergen Reactions   Ace Inhibitors Other (See Comments) and Cough    Persistent dry cough   Amitriptyline Hcl Nausea And Vomiting and Other (See Comments)    GI Intolerance/Upset stomach   Aspirin Other (See Comments)     GI Intolerance   Latex Itching   Prednisone Nausea And Vomiting    Patient Active Problem List   Diagnosis Date Noted   Gait instability 08/16/2023   Hypertensive heart and chronic kidney disease stage 5 (HCC) 06/30/2023   Chronic diastolic CHF (congestive heart failure) (HCC) 06/18/2023   Class 1 obesity 06/18/2023   Dyspnea on exertion 05/19/2023   Uncontrolled type 2 diabetes mellitus with hyperglycemia (HCC) 05/19/2023   Malnutrition of moderate degree 04/15/2023   Hyperglycemia 04/13/2023   CKD (chronic kidney disease) stage 5, GFR less than 15 ml/min (HCC) 03/09/2023   Stage 4 chronic kidney disease (HCC) 02/09/2022   Hyperlipidemia associated with type 2 diabetes mellitus (HCC) 04/20/2019   Vaginal atrophy 07/22/2018   Morbid obesity (HCC) 08/04/2017   Peripheral edema 12/18/2014   GERD (gastroesophageal reflux disease) 01/16/2014   Carpal tunnel syndrome, bilateral    Hypertension associated with diabetes (HCC) 11/09/2006   Controlled diabetes mellitus with retinopathy (HCC) 12/07/2002    Past Medical History:  Diagnosis Date   Carpal tunnel syndrome, bilateral    confirmed on nerve conduction studies   Chronic hepatitis C (HCC)    considered cured 06/2016 post retroviral therapy    Diabetes mellitus type 2, uncontrolled    Diabetic peripheral neuropathy (HCC)    GERD (gastroesophageal reflux disease)    History of endometriosis    History of Helicobacter infection 11/07   Hyperlipidemia    Liver fibrosis 02/17/2016   F2/3     Past Surgical History:  Procedure Laterality Date   ABDOMINAL HYSTERECTOMY     CARPAL TUNNEL RELEASE      Social History   Socioeconomic History   Marital status: Single    Spouse name: Not on file   Number of children: 0   Years of education: 12   Highest education level: Not on file  Occupational History   Occupation: Scientist, 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 There are not steps up to the entrance the patient uses.       Patient's method of transportation is via family member (mom or friend).      The highest level of education was high school diploma.      The patient currently  disabled.      Identified important Relationships are "My mother"       Pets : None       Interests / Fun: "TV, movies"       Current Stressors: "People, my health"       Religious / Personal Beliefs: "Baptist"       L. Leward Quan, RN, BSN       Social Determinants of Health   Financial Resource Strain: Low Risk  (04/24/2022)   Overall Financial Resource Strain (CARDIA)    Difficulty of Paying Living Expenses: Not hard at all  Food Insecurity: No Food Insecurity (06/22/2023)   Hunger Vital Sign    Worried About Running Out of Food in the Last Year: Never true    Ran Out of Food in the Last Year: Never true  Transportation Needs: No Transportation Needs (06/22/2023)   PRAPARE - Administrator, Civil Service (Medical): No    Lack of Transportation (Non-Medical): No  Physical Activity: Not on file  Stress: No Stress Concern Present (04/24/2022)   [employer] of Occupational Health - Occupational Stress Questionnaire    Feeling of Stress : Not at all  Social Connections: Socially Isolated (04/24/2022)   Social Connection and Isolation Panel [NHANES]    Frequency of Communication with Friends and Family: Three times a week    Frequency of Social Gatherings with Friends and Family: Three times a week    Attends Religious  Services: Never    Active Member of Clubs or Organizations: No    Attends Banker Meetings: Never    Marital Status: Divorced  Catering manager Violence: Not At Risk (04/24/2022)   Humiliation, Afraid, Rape, and Kick questionnaire    Fear of Current or Ex-Partner: No    Emotionally Abused: No    Physically Abused: No    Sexually Abused: No    Family History  Problem Relation Age of Onset   Cancer Mother        lung   Kidney disease Mother    Lung cancer Maternal Uncle    Breast cancer Maternal Grandmother    Liver cancer Maternal Grandfather    Heart disease Brother    Depression Brother    Diabetes Father    Dementia Father    Colon cancer Neg Hx      Review of Systems  Constitutional: Negative.  Negative for chills and fever.  HENT: Negative.  Negative for congestion and sore throat.   Respiratory: Negative.  Negative for cough and shortness of breath.   Cardiovascular: Negative.  Negative for chest pain and palpitations.  Gastrointestinal:  Negative for abdominal pain, diarrhea, nausea and vomiting.  Genitourinary: Negative.  Negative for dysuria and hematuria.  Skin: Negative.  Negative for rash.  Neurological: Negative.  Negative for dizziness and headaches.  All other systems reviewed and are negative.  Vitals:   10/04/23 1054  BP: (!) 140/88  Pulse: 70  Temp: 98 F (36.7 C)    Physical Exam Vitals reviewed.  Constitutional:      Appearance: Normal appearance.  HENT:     Head: Normocephalic.     Mouth/Throat:     Mouth: Mucous membranes are moist.     Pharynx: Oropharynx is clear.  Eyes:     Extraocular Movements: Extraocular movements intact.     Pupils: Pupils are equal, round, and reactive to light.  Cardiovascular:     Rate and Rhythm: Normal rate and regular rhythm.  Pulses: Normal pulses.     Heart sounds: Normal heart sounds.  Pulmonary:     Effort: Pulmonary effort is normal.     Breath sounds: Normal breath sounds.   Musculoskeletal:     Cervical back: No tenderness.     Right lower leg: No edema.     Left lower leg: No edema.  Lymphadenopathy:     Cervical: No cervical adenopathy.  Skin:    General: Skin is warm and dry.     Capillary Refill: Capillary refill takes less than 2 seconds.  Neurological:     General: No focal deficit present.     Mental Status: She is alert and oriented to person, place, and time.  Psychiatric:        Mood and Affect: Mood normal.        Behavior: Behavior normal.    ASSESSMENT & PLAN: A total of 44 minutes was spent with the patient and counseling/coordination of care regarding preparing for this visit, review of most recent office visit notes, review of multiple chronic medical conditions under management, review of all medications, review of most recent blood work results, education on nutrition, prognosis, documentation, and need for follow-up.  Problem List Items Addressed This Visit       Cardiovascular and Mediastinum   Hypertension associated with diabetes (HCC)    Well-controlled hypertension Lab Results  Component Value Date   HGBA1C 8.9 (A) 08/16/2023  Continues insulin treatment No hypoglycemic episodes at home Stable sugars at home.       Hypertensive heart and chronic kidney disease stage 5 (HCC) - Primary    BP Readings from Last 3 Encounters:  10/04/23 (!) 140/88  09/15/23 134/84  08/16/23 (!) 160/82  Stable blood pressure with normal readings at home Continue diltiazem 360 mg daily Tolerating dialysis well. Feels much improved. Clinically normovolemic. No concerns today.          Endocrine   Hyperlipidemia associated with type 2 diabetes mellitus (HCC)    Chronic stable conditions Continue atorvastatin 40 mg and Zetia 10 mg daily        Patient Instructions  Health Maintenance After Age 78 After age 72, you are at a higher risk for certain long-term diseases and infections as well as injuries from falls. Falls are a  major cause of broken bones and head injuries in people who are older than age 22. Getting regular preventive care can help to keep you healthy and well. Preventive care includes getting regular testing and making lifestyle changes as recommended by your health care provider. Talk with your health care provider about: Which screenings and tests you should have. A screening is a test that checks for a disease when you have no symptoms. A diet and exercise plan that is right for you. What should I know about screenings and tests to prevent falls? Screening and testing are the best ways to find a health problem early. Early diagnosis and treatment give you the best chance of managing medical conditions that are common after age 54. Certain conditions and lifestyle choices may make you more likely to have a fall. Your health care provider may recommend: Regular vision checks. Poor vision and conditions such as cataracts can make you more likely to have a fall. If you wear glasses, make sure to get your prescription updated if your vision changes. Medicine review. Work with your health care provider to regularly review all of the medicines you are taking, including over-the-counter medicines. Ask your  health care provider about any side effects that may make you more likely to have a fall. Tell your health care provider if any medicines that you take make you feel dizzy or sleepy. Strength and balance checks. Your health care provider may recommend certain tests to check your strength and balance while standing, walking, or changing positions. Foot health exam. Foot pain and numbness, as well as not wearing proper footwear, can make you more likely to have a fall. Screenings, including: Osteoporosis screening. Osteoporosis is a condition that causes the bones to get weaker and break more easily. Blood pressure screening. Blood pressure changes and medicines to control blood pressure can make you feel  dizzy. Depression screening. You may be more likely to have a fall if you have a fear of falling, feel depressed, or feel unable to do activities that you used to do. Alcohol use screening. Using too much alcohol can affect your balance and may make you more likely to have a fall. Follow these instructions at home: Lifestyle Do not drink alcohol if: Your health care provider tells you not to drink. If you drink alcohol: Limit how much you have to: 0-1 drink a day for women. 0-2 drinks a day for men. Know how much alcohol is in your drink. In the [place] one drink equals one 12 oz bottle of beer (355 mL), one 5 oz glass of wine (148 mL), or one 1 oz glass of hard liquor (44 mL). Do not use any products that contain nicotine or tobacco. These products include cigarettes, chewing tobacco, and vaping devices, such as e-cigarettes. If you need help quitting, ask your health care provider. Activity  Follow a regular exercise program to stay fit. This will help you maintain your balance. Ask your health care provider what types of exercise are appropriate for you. If you need a cane or walker, use it as recommended by your health care provider. Wear supportive shoes that have nonskid soles. Safety  Remove any tripping hazards, such as rugs, cords, and clutter. Install safety equipment such as grab bars in bathrooms and safety rails on stairs. Keep rooms and walkways well-lit. General instructions Talk with your health care provider about your risks for falling. Tell your health care provider if: You fall. Be sure to tell your health care provider about all falls, even ones that seem minor. You feel dizzy, tiredness (fatigue), or off-balance. Take over-the-counter and prescription medicines only as told by your health care provider. These include supplements. Eat a healthy diet and maintain a healthy weight. A healthy diet includes low-fat dairy products, low-fat (lean) meats, and fiber from whole  grains, beans, and lots of fruits and vegetables. Stay current with your vaccines. Schedule regular health, dental, and eye exams. Summary Having a healthy lifestyle and getting preventive care can help to protect your health and wellness after age 71. Screening and testing are the best way to find a health problem early and help you avoid having a fall. Early diagnosis and treatment give you the best chance for managing medical conditions that are more common for people who are older than age 88. Falls are a major cause of broken bones and head injuries in people who are older than age 58. Take precautions to prevent a fall at home. Work with your health care provider to learn what changes you can make to improve your health and wellness and to prevent falls. This information is not intended to replace advice given to you by your  health care provider. Make sure you discuss any questions you have with your health care provider. Document Revised: 04/14/2021 Document Reviewed: 04/14/2021 Elsevier Patient Education  2024 Elsevier Inc.     Edwina Barth, MD [facility]

## 2023-10-04 NOTE — Patient Instructions (Signed)
Health Maintenance After Age 65 After age 65, you are at a higher risk for certain long-term diseases and infections as well as injuries from falls. Falls are a major cause of broken bones and head injuries in people who are older than age 65. Getting regular preventive care can help to keep you healthy and well. Preventive care includes getting regular testing and making lifestyle changes as recommended by your health care provider. Talk with your health care provider about: Which screenings and tests you should have. A screening is a test that checks for a disease when you have no symptoms. A diet and exercise plan that is right for you. What should I know about screenings and tests to prevent falls? Screening and testing are the best ways to find a health problem early. Early diagnosis and treatment give you the best chance of managing medical conditions that are common after age 65. Certain conditions and lifestyle choices may make you more likely to have a fall. Your health care provider may recommend: Regular vision checks. Poor vision and conditions such as cataracts can make you more likely to have a fall. If you wear glasses, make sure to get your prescription updated if your vision changes. Medicine review. Work with your health care provider to regularly review all of the medicines you are taking, including over-the-counter medicines. Ask your health care provider about any side effects that may make you more likely to have a fall. Tell your health care provider if any medicines that you take make you feel dizzy or sleepy. Strength and balance checks. Your health care provider may recommend certain tests to check your strength and balance while standing, walking, or changing positions. Foot health exam. Foot pain and numbness, as well as not wearing proper footwear, can make you more likely to have a fall. Screenings, including: Osteoporosis screening. Osteoporosis is a condition that causes  the bones to get weaker and break more easily. Blood pressure screening. Blood pressure changes and medicines to control blood pressure can make you feel dizzy. Depression screening. You may be more likely to have a fall if you have a fear of falling, feel depressed, or feel unable to do activities that you used to do. Alcohol use screening. Using too much alcohol can affect your balance and may make you more likely to have a fall. Follow these instructions at home: Lifestyle Do not drink alcohol if: Your health care provider tells you not to drink. If you drink alcohol: Limit how much you have to: 0-1 drink a day for women. 0-2 drinks a day for men. Know how much alcohol is in your drink. In the [place] one drink equals one 12 oz bottle of beer (355 mL), one 5 oz glass of wine (148 mL), or one 1 oz glass of hard liquor (44 mL). Do not use any products that contain nicotine or tobacco. These products include cigarettes, chewing tobacco, and vaping devices, such as e-cigarettes. If you need help quitting, ask your health care provider. Activity  Follow a regular exercise program to stay fit. This will help you maintain your balance. Ask your health care provider what types of exercise are appropriate for you. If you need a cane or walker, use it as recommended by your health care provider. Wear supportive shoes that have nonskid soles. Safety  Remove any tripping hazards, such as rugs, cords, and clutter. Install safety equipment such as grab bars in bathrooms and safety rails on stairs. Keep rooms and walkways   well-lit. General instructions Talk with your health care provider about your risks for falling. Tell your health care provider if: You fall. Be sure to tell your health care provider about all falls, even ones that seem minor. You feel dizzy, tiredness (fatigue), or off-balance. Take over-the-counter and prescription medicines only as told by your health care provider. These include  supplements. Eat a healthy diet and maintain a healthy weight. A healthy diet includes low-fat dairy products, low-fat (lean) meats, and fiber from whole grains, beans, and lots of fruits and vegetables. Stay current with your vaccines. Schedule regular health, dental, and eye exams. Summary Having a healthy lifestyle and getting preventive care can help to protect your health and wellness after age 65. Screening and testing are the best way to find a health problem early and help you avoid having a fall. Early diagnosis and treatment give you the best chance for managing medical conditions that are more common for people who are older than age 65. Falls are a major cause of broken bones and head injuries in people who are older than age 65. Take precautions to prevent a fall at home. Work with your health care provider to learn what changes you can make to improve your health and wellness and to prevent falls. This information is not intended to replace advice given to you by your health care provider. Make sure you discuss any questions you have with your health care provider. Document Revised: 04/14/2021 Document Reviewed: 04/14/2021 Elsevier Patient Education  2024 Elsevier Inc.  

## 2023-10-04 NOTE — Assessment & Plan Note (Addendum)
BP Readings from Last 3 Encounters:  10/04/23 (!) 140/88  09/15/23 134/84  08/16/23 (!) 160/82  Stable blood pressure with normal readings at home Continue diltiazem 360 mg daily Tolerating dialysis well. Feels much improved. Clinically normovolemic. No concerns today.

## 2023-10-04 NOTE — Assessment & Plan Note (Signed)
Chronic stable conditions Continue atorvastatin 40 mg and Zetia 10 mg daily

## 2023-10-05 ENCOUNTER — Other Ambulatory Visit: Payer: Self-pay | Admitting: Emergency Medicine

## 2023-10-05 ENCOUNTER — Telehealth (HOSPITAL_COMMUNITY): Payer: Self-pay | Admitting: *Deleted

## 2023-10-05 DIAGNOSIS — Z992 Dependence on renal dialysis: Secondary | ICD-10-CM | POA: Diagnosis not present

## 2023-10-05 DIAGNOSIS — N186 End stage renal disease: Secondary | ICD-10-CM | POA: Diagnosis not present

## 2023-10-05 DIAGNOSIS — N2581 Secondary hyperparathyroidism of renal origin: Secondary | ICD-10-CM | POA: Diagnosis not present

## 2023-10-05 DIAGNOSIS — I120 Hypertensive chronic kidney disease with stage 5 chronic kidney disease or end stage renal disease: Secondary | ICD-10-CM | POA: Diagnosis not present

## 2023-10-05 DIAGNOSIS — E8770 Fluid overload, unspecified: Secondary | ICD-10-CM | POA: Diagnosis not present

## 2023-10-05 DIAGNOSIS — Z1231 Encounter for screening mammogram for malignant neoplasm of breast: Secondary | ICD-10-CM

## 2023-10-05 NOTE — Telephone Encounter (Signed)
Received fax from Dr Sabra Heck requesting permanent dialysis access placement. Will give to [facility].

## 2023-10-06 ENCOUNTER — Telehealth: Payer: Self-pay

## 2023-10-06 NOTE — Patient Outreach (Signed)
Care Coordination   10/06/2023 Name: Lacosta Ax MRN: [id number] DOB: December 04, 1956   Care Coordination Outreach Attempts:  An unsuccessful telephone outreach was attempted for a scheduled appointment today.  Follow Up Plan:  Additional outreach attempts will be made to offer the patient care coordination information and services.   Encounter Outcome:  No Answer   Care Coordination Interventions:  No, not indicated    [name], RN, MSN, BSN, CCM Care Management Coordinator [phone]

## 2023-10-07 DIAGNOSIS — E1122 Type 2 diabetes mellitus with diabetic chronic kidney disease: Secondary | ICD-10-CM | POA: Diagnosis not present

## 2023-10-07 DIAGNOSIS — I120 Hypertensive chronic kidney disease with stage 5 chronic kidney disease or end stage renal disease: Secondary | ICD-10-CM | POA: Diagnosis not present

## 2023-10-07 DIAGNOSIS — E8770 Fluid overload, unspecified: Secondary | ICD-10-CM | POA: Diagnosis not present

## 2023-10-07 DIAGNOSIS — N2581 Secondary hyperparathyroidism of renal origin: Secondary | ICD-10-CM | POA: Diagnosis not present

## 2023-10-07 DIAGNOSIS — Z992 Dependence on renal dialysis: Secondary | ICD-10-CM | POA: Diagnosis not present

## 2023-10-07 DIAGNOSIS — N186 End stage renal disease: Secondary | ICD-10-CM | POA: Diagnosis not present

## 2023-10-08 DIAGNOSIS — Z992 Dependence on renal dialysis: Secondary | ICD-10-CM | POA: Diagnosis not present

## 2023-10-08 DIAGNOSIS — N186 End stage renal disease: Secondary | ICD-10-CM | POA: Diagnosis not present

## 2023-10-08 DIAGNOSIS — N2581 Secondary hyperparathyroidism of renal origin: Secondary | ICD-10-CM | POA: Diagnosis not present

## 2023-10-11 DIAGNOSIS — D509 Iron deficiency anemia, unspecified: Secondary | ICD-10-CM | POA: Diagnosis not present

## 2023-10-11 DIAGNOSIS — T8249XA Other complication of vascular dialysis catheter, initial encounter: Secondary | ICD-10-CM | POA: Diagnosis not present

## 2023-10-11 DIAGNOSIS — E1122 Type 2 diabetes mellitus with diabetic chronic kidney disease: Secondary | ICD-10-CM | POA: Diagnosis not present

## 2023-10-11 DIAGNOSIS — D631 Anemia in chronic kidney disease: Secondary | ICD-10-CM | POA: Diagnosis not present

## 2023-10-11 DIAGNOSIS — Z992 Dependence on renal dialysis: Secondary | ICD-10-CM | POA: Diagnosis not present

## 2023-10-11 DIAGNOSIS — N2581 Secondary hyperparathyroidism of renal origin: Secondary | ICD-10-CM | POA: Diagnosis not present

## 2023-10-11 DIAGNOSIS — N186 End stage renal disease: Secondary | ICD-10-CM | POA: Diagnosis not present

## 2023-10-12 ENCOUNTER — Other Ambulatory Visit: Payer: Self-pay

## 2023-10-12 DIAGNOSIS — N185 Chronic kidney disease, stage 5: Secondary | ICD-10-CM

## 2023-10-13 DIAGNOSIS — D509 Iron deficiency anemia, unspecified: Secondary | ICD-10-CM | POA: Diagnosis not present

## 2023-10-13 DIAGNOSIS — Z992 Dependence on renal dialysis: Secondary | ICD-10-CM | POA: Diagnosis not present

## 2023-10-13 DIAGNOSIS — D631 Anemia in chronic kidney disease: Secondary | ICD-10-CM | POA: Diagnosis not present

## 2023-10-13 DIAGNOSIS — T8249XA Other complication of vascular dialysis catheter, initial encounter: Secondary | ICD-10-CM | POA: Diagnosis not present

## 2023-10-13 DIAGNOSIS — N2581 Secondary hyperparathyroidism of renal origin: Secondary | ICD-10-CM | POA: Diagnosis not present

## 2023-10-13 DIAGNOSIS — N186 End stage renal disease: Secondary | ICD-10-CM | POA: Diagnosis not present

## 2023-10-13 DIAGNOSIS — E1122 Type 2 diabetes mellitus with diabetic chronic kidney disease: Secondary | ICD-10-CM | POA: Diagnosis not present

## 2023-10-15 DIAGNOSIS — Z992 Dependence on renal dialysis: Secondary | ICD-10-CM | POA: Diagnosis not present

## 2023-10-15 DIAGNOSIS — N186 End stage renal disease: Secondary | ICD-10-CM | POA: Diagnosis not present

## 2023-10-15 DIAGNOSIS — T8249XA Other complication of vascular dialysis catheter, initial encounter: Secondary | ICD-10-CM | POA: Diagnosis not present

## 2023-10-15 DIAGNOSIS — D631 Anemia in chronic kidney disease: Secondary | ICD-10-CM | POA: Diagnosis not present

## 2023-10-15 DIAGNOSIS — D509 Iron deficiency anemia, unspecified: Secondary | ICD-10-CM | POA: Diagnosis not present

## 2023-10-15 DIAGNOSIS — N2581 Secondary hyperparathyroidism of renal origin: Secondary | ICD-10-CM | POA: Diagnosis not present

## 2023-10-15 DIAGNOSIS — E1122 Type 2 diabetes mellitus with diabetic chronic kidney disease: Secondary | ICD-10-CM | POA: Diagnosis not present

## 2023-10-18 DIAGNOSIS — D509 Iron deficiency anemia, unspecified: Secondary | ICD-10-CM | POA: Diagnosis not present

## 2023-10-18 DIAGNOSIS — N186 End stage renal disease: Secondary | ICD-10-CM | POA: Diagnosis not present

## 2023-10-18 DIAGNOSIS — T8249XA Other complication of vascular dialysis catheter, initial encounter: Secondary | ICD-10-CM | POA: Diagnosis not present

## 2023-10-18 DIAGNOSIS — Z992 Dependence on renal dialysis: Secondary | ICD-10-CM | POA: Diagnosis not present

## 2023-10-18 DIAGNOSIS — E1122 Type 2 diabetes mellitus with diabetic chronic kidney disease: Secondary | ICD-10-CM | POA: Diagnosis not present

## 2023-10-18 DIAGNOSIS — N2581 Secondary hyperparathyroidism of renal origin: Secondary | ICD-10-CM | POA: Diagnosis not present

## 2023-10-18 DIAGNOSIS — D631 Anemia in chronic kidney disease: Secondary | ICD-10-CM | POA: Diagnosis not present

## 2023-10-20 DIAGNOSIS — T8249XA Other complication of vascular dialysis catheter, initial encounter: Secondary | ICD-10-CM | POA: Diagnosis not present

## 2023-10-20 DIAGNOSIS — D509 Iron deficiency anemia, unspecified: Secondary | ICD-10-CM | POA: Diagnosis not present

## 2023-10-20 DIAGNOSIS — E1122 Type 2 diabetes mellitus with diabetic chronic kidney disease: Secondary | ICD-10-CM | POA: Diagnosis not present

## 2023-10-20 DIAGNOSIS — N186 End stage renal disease: Secondary | ICD-10-CM | POA: Diagnosis not present

## 2023-10-20 DIAGNOSIS — D631 Anemia in chronic kidney disease: Secondary | ICD-10-CM | POA: Diagnosis not present

## 2023-10-20 DIAGNOSIS — Z992 Dependence on renal dialysis: Secondary | ICD-10-CM | POA: Diagnosis not present

## 2023-10-20 DIAGNOSIS — N2581 Secondary hyperparathyroidism of renal origin: Secondary | ICD-10-CM | POA: Diagnosis not present

## 2023-10-21 ENCOUNTER — Encounter (HOSPITAL_COMMUNITY): Payer: Self-pay | Admitting: Vascular Surgery

## 2023-10-21 ENCOUNTER — Other Ambulatory Visit: Payer: Self-pay

## 2023-10-21 DIAGNOSIS — T8249XA Other complication of vascular dialysis catheter, initial encounter: Secondary | ICD-10-CM | POA: Diagnosis not present

## 2023-10-21 DIAGNOSIS — N2581 Secondary hyperparathyroidism of renal origin: Secondary | ICD-10-CM | POA: Diagnosis not present

## 2023-10-21 DIAGNOSIS — D631 Anemia in chronic kidney disease: Secondary | ICD-10-CM | POA: Diagnosis not present

## 2023-10-21 DIAGNOSIS — Z992 Dependence on renal dialysis: Secondary | ICD-10-CM | POA: Diagnosis not present

## 2023-10-21 DIAGNOSIS — E1122 Type 2 diabetes mellitus with diabetic chronic kidney disease: Secondary | ICD-10-CM | POA: Diagnosis not present

## 2023-10-21 DIAGNOSIS — N186 End stage renal disease: Secondary | ICD-10-CM | POA: Diagnosis not present

## 2023-10-21 DIAGNOSIS — D509 Iron deficiency anemia, unspecified: Secondary | ICD-10-CM | POA: Diagnosis not present

## 2023-10-21 NOTE — Progress Notes (Signed)
Anesthesia Chart Review: Same day workup  67 year old female follows with cardiology for history of HTN, questionable hypertrophic cardiomyopathy on recent echo.  Seen by Dr. Raynelle Jan on 06/30/2023.  Per note, "will increase to diltazem 360 mg PO XL - will schedule pharm D visit for HTN; I do not suspect oHCM given minimal LVOT gradient and chronic uncontrolled BP; given kidney I do not plan on CMR at this time - continue current diuretic; she has nephrology appt pending."  Patient did subsequently follow-up with Pharm.D. and was continued on diltiazem XL 360 mg daily.  History of uncontrolled IDDM 2, last A1c 8.9 on 08/16/2023.  Recent admission in July 2024 for HHS and questionable seizures.  She was evaluated by neurology, seizure activity felt secondary to hyperglycemia.  Per discharge summary, "Patient had no further seizures while her glucose was better controlled, no current indication for antiepileptic agents per neurology recommendations."  History of chronic hep C s/p successful treatment 2017 with elbasvir/grazoprevir .  ESRD with recent initiation of HD in September 2024.  Dialyzing Monday Wednesday Friday via right IJ TDC.  Patient will need day of surgery labs and evaluation.  EKG 06/17/2023: Sinus rhythm.  Rate 85. Borderline right axis deviation  TTE 04/19/2023:  1. Left ventricular ejection fraction, by estimation, is 70 to 75%. The  left ventricle has hyperdynamic function. The left ventricle has no  regional wall motion abnormalities. There is moderate concentric left  ventricular hypertrophy of the septal  segment. Left ventricular diastolic parameters are consistent with Grade I  diastolic dysfunction (impaired relaxation).   2. Right ventricular systolic function is hyperdynamic. The right  ventricular size is normal. There is normal pulmonary artery systolic  pressure.   3. The mitral valve is normal in structure. No evidence of mitral valve  regurgitation.   4. The  aortic valve is tricuspid. Aortic valve regurgitation is not  visualized.   5. The inferior vena cava is normal in size with greater than 50%  respiratory variability, suggesting right atrial pressure of 3 mmHg.   Conclusion(s)/Recommendation(s): Findings consistent with hypertrophic  obstructive cardiomyopathy. Consider B-blocker and calcium channel blocker  like diltiazem as tolerated.      Zannie Cove [facility] Short Stay Center/Anesthesiology Phone [phone] 10/21/2023 11:00 AM

## 2023-10-21 NOTE — Anesthesia Preprocedure Evaluation (Addendum)
Anesthesia Evaluation  Patient identified by MRN, date of birth, ID band Patient awake    Reviewed: Allergy & Precautions, NPO status , Patient's Chart, lab work & pertinent test results  Airway Mallampati: II  TM Distance: >3 FB Neck ROM: Full    Dental  (+) Dental Advisory Given, Partial Upper   Pulmonary neg pulmonary ROS   Pulmonary exam normal breath sounds clear to auscultation       Cardiovascular hypertension, Pt. on medications +CHF  Normal cardiovascular exam Rhythm:Regular Rate:Normal     Neuro/Psych  Neuromuscular disease    GI/Hepatic ,GERD  Medicated,,(+) Hepatitis -, C  Endo/Other  diabetes, Type 2, Insulin Dependent  Class 3 obesity  Renal/GU ESRF and DialysisRenal disease (MWF)     Musculoskeletal negative musculoskeletal ROS (+)    Abdominal   Peds  Hematology negative hematology ROS (+)   Anesthesia Other Findings Day of surgery medications reviewed with the patient.  Reproductive/Obstetrics                             Anesthesia Physical Anesthesia Plan  ASA: 3  Anesthesia Plan: Regional   Post-op Pain Management: Tylenol PO (pre-op)* and Regional block*   Induction: Intravenous  PONV Risk Score and Plan: 2 and TIVA, Midazolam, Dexamethasone and Ondansetron  Airway Management Planned: Natural Airway and Simple Face Mask  Additional Equipment:   Intra-op Plan:   Post-operative Plan:   Informed Consent: I have reviewed the patients History and Physical, chart, labs and discussed the procedure including the risks, benefits and alternatives for the proposed anesthesia with the patient or authorized representative who has indicated his/her understanding and acceptance.     Dental advisory given  Plan Discussed with: CRNA  Anesthesia Plan Comments: (PAT note by Antionette Poles, PA-C: 67 year old female follows with cardiology for history of HTN, questionable  hypertrophic cardiomyopathy on recent echo.  Seen by Dr. Raynelle Jan on 06/30/2023.  Per note, "will increase to diltazem 360 mg PO XL - will schedule pharm D visit for HTN; I do not suspect oHCM given minimal LVOT gradient and chronic uncontrolled BP; given kidney I do not plan on CMR at this time - continue current diuretic; she has nephrology appt pending."  Patient did subsequently follow-up with Pharm.D. and was continued on diltiazem XL 360 mg daily.  History of uncontrolled IDDM 2, last A1c 8.9 on 08/16/2023.  Recent admission in July 2024 for HHS and questionable seizures.  She was evaluated by neurology, seizure activity felt secondary to hyperglycemia.  Per discharge summary, "Patient had no further seizures while her glucose was better controlled, no current indication for antiepileptic agents per neurology recommendations."  History of chronic hep C s/p successful treatment 2017 with elbasvir/grazoprevir .  ESRD with recent initiation of HD in September 2024.  Dialyzing Monday Wednesday Friday via right IJ TDC.  Patient will need day of surgery labs and evaluation.  EKG 06/17/2023: Sinus rhythm.  Rate 85. Borderline right axis deviation  TTE 04/19/2023: 1. Left ventricular ejection fraction, by estimation, is 70 to 75%. The  left ventricle has hyperdynamic function. The left ventricle has no  regional wall motion abnormalities. There is moderate concentric left  ventricular hypertrophy of the septal  segment. Left ventricular diastolic parameters are consistent with Grade I  diastolic dysfunction (impaired relaxation).  2. Right ventricular systolic function is hyperdynamic. The right  ventricular size is normal. There is normal pulmonary artery systolic  pressure.  3.  The mitral valve is normal in structure. No evidence of mitral valve  regurgitation.  4. The aortic valve is tricuspid. Aortic valve regurgitation is not  visualized.  5. The inferior vena cava is normal in size  with greater than 50%  respiratory variability, suggesting right atrial pressure of 3 mmHg.   Conclusion(s)/Recommendation(s): Findings consistent with hypertrophic  obstructive cardiomyopathy. Consider B-blocker and calcium channel blocker  like diltiazem as tolerated.    )        Anesthesia Quick Evaluation

## 2023-10-21 NOTE — Progress Notes (Addendum)
PCP - Georgina Quint, MD  Cardiologist - Christell Constant, MD  Dialysis Mon. Wed. Friday. Dialysis today due to procedure 10-22-23  PPM/ICD - Denies Device Orders - n/a Rep Notified - n/a  Chest x-ray - 06-17-23 CT of Chest 06-17-23 EKG - 06-17-23 Stress Test - denies ECHO - 04-19-23 Cardiac Cath -   CPAP - Denies Sleep study- 09-19-18 Per patient everything was fine  GLP-1 -  Fasting Blood Sugar - 190-200 Checks Blood Sugar BID  Blood Thinner Instructions: deneis Aspirin Instructions: n/a  ERAS Protcol - NPO  COVID TEST- na  Anesthesia review: Yes Hx of Dm HTN  Patient verbally denies any shortness of breath, fever, cough and chest pain during phone call   -------------  SDW INSTRUCTIONS given:  Your procedure is scheduled on October 22, 2023.  Report to [facility] Main Entrance "A" at 5:30 A.M., and check in at the Admitting office.  Call this number if you have problems the morning of surgery:  [phone]   Remember:  Do not eat after midnight the night before your surgery      Take these medicines the morning of surgery with A SIP OF WATER  atorvastatin (LIPITOR)  diltiazem (CARDIZEM CD)  ezetimibe (ZETIA)  omeprazole (PRILOSEC)   IF NEEDED    As of today, STOP taking any Aspirin (unless otherwise instructed by your surgeon) Aleve, Naproxen, Ibuprofen, Motrin, Advil, Goody's, BC's, all herbal medications, fish oil, and all vitamins. THIS INCLUDES YOUR diclofenac Sodium (VOLTAREN).   WHAT DO I DO ABOUT MY DIABETES MEDICATION?   Do not take oral diabetes medicines (pills) the morning of surgery.  .insulin glargine (LANTUS) Patient taking it at 400 pm.         The day of surgery, do not take other diabetes injectables, including Byetta (exenatide), Bydureon (exenatide ER), Victoza (liraglutide), or Trulicity (dulaglutide).     If your CBG is greater than 220 mg/dL, you may take  of your sliding scale (correction) dose of   lispro (HUMALOG) insulin in the am  HOW TO MANAGE YOUR DIABETES BEFORE AND AFTER SURGERY  Why is it important to control my blood sugar before and after surgery? Improving blood sugar levels before and after surgery helps healing and can limit problems. A way of improving blood sugar control is eating a healthy diet by:  Eating less sugar and carbohydrates  Increasing activity/exercise  Talking with your doctor about reaching your blood sugar goals High blood sugars (greater than 180 mg/dL) can raise your risk of infections and slow your recovery, so you will need to focus on controlling your diabetes during the weeks before surgery. Make sure that the doctor who takes care of your diabetes knows about your planned surgery including the date and location.  How do I manage my blood sugar before surgery? Check your blood sugar at least 4 times a day, starting 2 days before surgery, to make sure that the level is not too high or low.  Check your blood sugar the morning of your surgery when you wake up and every 2 hours until you get to the Short Stay unit.  If your blood sugar is less than 70 mg/dL, you will need to treat for low blood sugar: Do not take insulin. Treat a low blood sugar (less than 70 mg/dL) with  cup of clear juice (cranberry or apple), 4 glucose tablets, OR glucose gel. Recheck blood sugar in 15 minutes after treatment (to make sure it is greater than  70 mg/dL). If your blood sugar is not greater than 70 mg/dL on recheck, call [phone] for further instructions. Report your blood sugar to the short stay nurse when you get to Short Stay.  If you are admitted to the hospital after surgery: Your blood sugar will be checked by the staff and you will probably be given insulin after surgery (instead of oral diabetes medicines) to make sure you have good blood sugar levels. The goal for blood sugar control after surgery is 80-180 mg/dL.                      Do not wear  jewelry, make up, or nail polish            Do not wear lotions, powders, perfumes/colognes, or deodorant.            Do not shave 48 hours prior to surgery.  Men may shave face and neck.            Do not bring valuables to the hospital.            [facility] is not responsible for any belongings or valuables.  Do NOT Smoke (Tobacco/Vaping) 24 hours prior to your procedure If you use a CPAP at night, you may bring all equipment for your overnight stay.   Contacts, glasses, dentures or bridgework may not be worn into surgery.      For patients admitted to the hospital, discharge time will be determined by your treatment team.   Patients discharged the day of surgery will not be allowed to drive home, and someone needs to stay with them for 24 hours.    Special instructions:   [facility]- Preparing For Surgery  Before surgery, you can play an important role. Because skin is not sterile, your skin needs to be as free of germs as possible. You can reduce the number of germs on your skin by washing with CHG (chlorahexidine gluconate) Soap before surgery.  CHG is an antiseptic cleaner which kills germs and bonds with the skin to continue killing germs even after washing.    Oral Hygiene is also important to reduce your risk of infection.  Remember - BRUSH YOUR TEETH THE MORNING OF SURGERY WITH YOUR REGULAR TOOTHPASTE  Please do not use if you have an allergy to CHG or antibacterial soaps. If your skin becomes reddened/irritated stop using the CHG.  Do not shave (including legs and underarms) for at least 48 hours prior to first CHG shower. It is OK to shave your face.  Please follow these instructions carefully.   Shower the NIGHT BEFORE SURGERY and the MORNING OF SURGERY with DIAL Soap.   Pat yourself dry with a CLEAN TOWEL.  Wear CLEAN PAJAMAS to bed the night before surgery  Place CLEAN SHEETS on your bed the night of your first shower and DO NOT SLEEP WITH PETS.   Day of  Surgery: Please shower morning of surgery  Wear Clean/Comfortable clothing the morning of surgery Do not apply any deodorants/lotions.   Remember to brush your teeth WITH YOUR REGULAR TOOTHPASTE.   Questions were answered. Patient verbalized understanding of instructions.

## 2023-10-22 ENCOUNTER — Ambulatory Visit (HOSPITAL_COMMUNITY): Payer: 59 | Admitting: Physician Assistant

## 2023-10-22 ENCOUNTER — Ambulatory Visit (HOSPITAL_BASED_OUTPATIENT_CLINIC_OR_DEPARTMENT_OTHER): Payer: 59 | Admitting: Physician Assistant

## 2023-10-22 ENCOUNTER — Ambulatory Visit (HOSPITAL_COMMUNITY)
Admission: RE | Admit: 2023-10-22 | Discharge: 2023-10-22 | Disposition: A | Discharge status: Home / Self Care | Payer: 59 | Attending: Vascular Surgery | Admitting: Vascular Surgery

## 2023-10-22 ENCOUNTER — Other Ambulatory Visit: Payer: Self-pay

## 2023-10-22 ENCOUNTER — Encounter (HOSPITAL_COMMUNITY): Payer: Self-pay | Admitting: Vascular Surgery

## 2023-10-22 ENCOUNTER — Encounter (HOSPITAL_COMMUNITY)
Admission: RE | Disposition: A | Discharge status: Home / Self Care | Payer: Self-pay | Source: Home / Self Care | Attending: Vascular Surgery

## 2023-10-22 DIAGNOSIS — E1122 Type 2 diabetes mellitus with diabetic chronic kidney disease: Secondary | ICD-10-CM | POA: Insufficient documentation

## 2023-10-22 DIAGNOSIS — E66813 Obesity, class 3: Secondary | ICD-10-CM | POA: Diagnosis not present

## 2023-10-22 DIAGNOSIS — I132 Hypertensive heart and chronic kidney disease with heart failure and with stage 5 chronic kidney disease, or end stage renal disease: Secondary | ICD-10-CM | POA: Diagnosis not present

## 2023-10-22 DIAGNOSIS — B182 Chronic viral hepatitis C: Secondary | ICD-10-CM | POA: Diagnosis not present

## 2023-10-22 DIAGNOSIS — I120 Hypertensive chronic kidney disease with stage 5 chronic kidney disease or end stage renal disease: Secondary | ICD-10-CM | POA: Diagnosis not present

## 2023-10-22 DIAGNOSIS — I1311 Hypertensive heart and chronic kidney disease without heart failure, with stage 5 chronic kidney disease, or end stage renal disease: Secondary | ICD-10-CM | POA: Diagnosis not present

## 2023-10-22 DIAGNOSIS — N186 End stage renal disease: Secondary | ICD-10-CM | POA: Insufficient documentation

## 2023-10-22 DIAGNOSIS — K219 Gastro-esophageal reflux disease without esophagitis: Secondary | ICD-10-CM | POA: Diagnosis not present

## 2023-10-22 DIAGNOSIS — Z7984 Long term (current) use of oral hypoglycemic drugs: Secondary | ICD-10-CM | POA: Diagnosis not present

## 2023-10-22 DIAGNOSIS — I509 Heart failure, unspecified: Secondary | ICD-10-CM

## 2023-10-22 DIAGNOSIS — N185 Chronic kidney disease, stage 5: Secondary | ICD-10-CM

## 2023-10-22 DIAGNOSIS — Z79899 Other long term (current) drug therapy: Secondary | ICD-10-CM | POA: Diagnosis not present

## 2023-10-22 DIAGNOSIS — Z6830 Body mass index (BMI) 30.0-30.9, adult: Secondary | ICD-10-CM | POA: Diagnosis not present

## 2023-10-22 DIAGNOSIS — I5032 Chronic diastolic (congestive) heart failure: Secondary | ICD-10-CM | POA: Diagnosis not present

## 2023-10-22 DIAGNOSIS — Z794 Long term (current) use of insulin: Secondary | ICD-10-CM | POA: Diagnosis not present

## 2023-10-22 DIAGNOSIS — Z992 Dependence on renal dialysis: Secondary | ICD-10-CM | POA: Insufficient documentation

## 2023-10-22 HISTORY — PX: AV FISTULA PLACEMENT: SHX1204

## 2023-10-22 HISTORY — DX: Chronic kidney disease, unspecified: N18.9

## 2023-10-22 LAB — POCT I-STAT, CHEM 8
BUN: 21 mg/dL (ref 8–23)
Calcium, Ion: 1.02 mmol/L — ABNORMAL LOW (ref 1.15–1.40)
Chloride: 102 mmol/L (ref 98–111)
Creatinine, Ser: 5.1 mg/dL — ABNORMAL HIGH (ref 0.44–1.00)
Glucose, Bld: 302 mg/dL — ABNORMAL HIGH (ref 70–99)
HCT: 37 % (ref 36.0–46.0)
Hemoglobin: 12.6 g/dL (ref 12.0–15.0)
Potassium: 3.6 mmol/L (ref 3.5–5.1)
Sodium: 139 mmol/L (ref 135–145)
TCO2: 25 mmol/L (ref 22–32)

## 2023-10-22 LAB — GLUCOSE, CAPILLARY
Glucose-Capillary: 277 mg/dL — ABNORMAL HIGH (ref 70–99)
Glucose-Capillary: 231 mg/dL — ABNORMAL HIGH (ref 70–99)
Glucose-Capillary: 224 mg/dL — ABNORMAL HIGH (ref 70–99)

## 2023-10-22 SURGERY — ARTERIOVENOUS (AV) FISTULA CREATION
Anesthesia: Regional | Laterality: Left

## 2023-10-22 MED ORDER — ACETAMINOPHEN 500 MG PO TABS
1000.0000 mg | ORAL_TABLET | Freq: Once | ORAL | Status: AC
  Administered 2023-10-22: 1000 mg via ORAL
  Filled 2023-10-22: qty 2

## 2023-10-22 MED ORDER — HEPARIN SODIUM (PORCINE) 1000 UNIT/ML IJ SOLN
INTRAMUSCULAR | Status: AC
  Filled 2023-10-22: qty 1

## 2023-10-22 MED ORDER — INSULIN ASPART 100 UNIT/ML IJ SOLN
0.0000 [IU] | INTRAMUSCULAR | Status: AC | PRN
Start: 2023-10-22 — End: 2023-10-22
  Administered 2023-10-22: 3 [IU] via SUBCUTANEOUS
  Administered 2023-10-22: 4 [IU] via SUBCUTANEOUS

## 2023-10-22 MED ORDER — FENTANYL CITRATE (PF) 250 MCG/5ML IJ SOLN
INTRAMUSCULAR | Status: DC | PRN
  Administered 2023-10-22: 50 ug via INTRAVENOUS

## 2023-10-22 MED ORDER — EPHEDRINE 5 MG/ML INJ
INTRAVENOUS | Status: AC
  Filled 2023-10-22: qty 5

## 2023-10-22 MED ORDER — PROPOFOL 500 MG/50ML IV EMUL
INTRAVENOUS | Status: DC | PRN
  Administered 2023-10-22: 100 ug/kg/min via INTRAVENOUS

## 2023-10-22 MED ORDER — LIDOCAINE HCL (PF) 1 % IJ SOLN
INTRAMUSCULAR | Status: AC
  Filled 2023-10-22: qty 30

## 2023-10-22 MED ORDER — CHLORHEXIDINE GLUCONATE 0.12 % MT SOLN
15.0000 mL | Freq: Once | OROMUCOSAL | Status: AC
Start: 2023-10-22 — End: 2023-10-22
  Administered 2023-10-22: 15 mL via OROMUCOSAL
  Filled 2023-10-22: qty 15

## 2023-10-22 MED ORDER — GLYCOPYRROLATE PF 0.2 MG/ML IJ SOSY
PREFILLED_SYRINGE | INTRAMUSCULAR | Status: AC
  Filled 2023-10-22: qty 1

## 2023-10-22 MED ORDER — PROPOFOL 10 MG/ML IV BOLUS
INTRAVENOUS | Status: AC
  Filled 2023-10-22: qty 20

## 2023-10-22 MED ORDER — CEFAZOLIN SODIUM-DEXTROSE 2-4 GM/100ML-% IV SOLN
2.0000 g | INTRAVENOUS | Status: AC
Start: 2023-10-22 — End: 2023-10-22
  Administered 2023-10-22: 2 g via INTRAVENOUS
  Filled 2023-10-22: qty 100

## 2023-10-22 MED ORDER — PAPAVERINE HCL 30 MG/ML IJ SOLN
INTRAMUSCULAR | Status: AC
  Filled 2023-10-22: qty 2

## 2023-10-22 MED ORDER — ROPIVACAINE HCL 5 MG/ML IJ SOLN
INTRAMUSCULAR | Status: DC | PRN
  Administered 2023-10-22: 30 mL via PERINEURAL

## 2023-10-22 MED ORDER — MIDAZOLAM HCL 2 MG/2ML IJ SOLN
INTRAMUSCULAR | Status: AC
  Filled 2023-10-22: qty 2

## 2023-10-22 MED ORDER — GLYCOPYRROLATE 0.2 MG/ML IJ SOLN
INTRAMUSCULAR | Status: DC | PRN
  Administered 2023-10-22 (×2): .1 mg via INTRAVENOUS

## 2023-10-22 MED ORDER — HEPARIN SODIUM (PORCINE) 1000 UNIT/ML IJ SOLN
INTRAMUSCULAR | Status: DC | PRN
  Administered 2023-10-22: 3000 [IU] via INTRAVENOUS

## 2023-10-22 MED ORDER — ORAL CARE MOUTH RINSE: 15.0000 mL | Freq: Once | OROMUCOSAL | Status: AC

## 2023-10-22 MED ORDER — CHLORHEXIDINE GLUCONATE 4 % EX SOLN: 60.0000 mL | Freq: Once | CUTANEOUS | Status: DC

## 2023-10-22 MED ORDER — OXYCODONE HCL 5 MG PO TABS: 5.0000 mg | ORAL_TABLET | ORAL | 0 refills | Status: DC | PRN

## 2023-10-22 MED ORDER — CEFAZOLIN SODIUM 1 G IJ SOLR
INTRAMUSCULAR | Status: AC
  Filled 2023-10-22: qty 20

## 2023-10-22 MED ORDER — PROPOFOL 10 MG/ML IV BOLUS
INTRAVENOUS | Status: DC | PRN
  Administered 2023-10-22: 25 mg via INTRAVENOUS

## 2023-10-22 MED ORDER — EPHEDRINE SULFATE-NACL 50-0.9 MG/10ML-% IV SOSY
PREFILLED_SYRINGE | INTRAVENOUS | Status: DC | PRN
  Administered 2023-10-22 (×4): 5 mg via INTRAVENOUS

## 2023-10-22 MED ORDER — 0.9 % SODIUM CHLORIDE (POUR BTL) OPTIME
TOPICAL | Status: DC | PRN
  Administered 2023-10-22: 1000 mL

## 2023-10-22 MED ORDER — HEPARIN 6000 UNIT IRRIGATION SOLUTION
Status: AC
  Filled 2023-10-22: qty 500

## 2023-10-22 MED ORDER — INSULIN ASPART 100 UNIT/ML IJ SOLN
INTRAMUSCULAR | Status: AC
  Filled 2023-10-22: qty 1

## 2023-10-22 MED ORDER — FENTANYL CITRATE (PF) 250 MCG/5ML IJ SOLN
INTRAMUSCULAR | Status: AC
  Filled 2023-10-22: qty 5

## 2023-10-22 MED ORDER — HEPARIN 6000 UNIT IRRIGATION SOLUTION
Status: DC | PRN
  Administered 2023-10-22: 1

## 2023-10-22 MED ORDER — SODIUM CHLORIDE 0.9 % IV SOLN: INTRAVENOUS | Status: DC | PRN

## 2023-10-22 MED ORDER — MIDAZOLAM HCL 2 MG/2ML IJ SOLN
INTRAMUSCULAR | Status: DC | PRN
  Administered 2023-10-22: 1 mg via INTRAVENOUS

## 2023-10-22 SURGICAL SUPPLY — 36 items
ARMBAND PINK RESTRICT EXTREMIT (MISCELLANEOUS) ×2 IMPLANT
BAG COUNTER SPONGE SURGICOUNT (BAG) ×1 IMPLANT
BLADE CLIPPER SURG (BLADE) ×1 IMPLANT
CANISTER SUCT 3000ML PPV (MISCELLANEOUS) ×1 IMPLANT
CLIP TI MEDIUM 6 (CLIP) ×1 IMPLANT
CLIP TI WIDE RED SMALL 6 (CLIP) ×1 IMPLANT
COVER PROBE W GEL 5X96 (DRAPES) ×1 IMPLANT
DERMABOND ADVANCED .7 DNX12 (GAUZE/BANDAGES/DRESSINGS) ×1 IMPLANT
ELECT REM PT RETURN 9FT ADLT (ELECTROSURGICAL) ×1
ELECTRODE REM PT RTRN 9FT ADLT (ELECTROSURGICAL) ×1 IMPLANT
GLOVE BIO SURGEON STRL SZ7.5 (GLOVE) ×1 IMPLANT
GLOVE BIOGEL PI IND STRL 6.5 (GLOVE) IMPLANT
GLOVE BIOGEL PI IND STRL 7.0 (GLOVE) IMPLANT
GLOVE BIOGEL PI IND STRL 8 (GLOVE) ×1 IMPLANT
GLOVE BIOGEL PI MICRO STRL 7.5 (GLOVE) IMPLANT
GOWN STRL REUS W/ TWL LRG LVL3 (GOWN DISPOSABLE) ×2 IMPLANT
GOWN STRL REUS W/ TWL XL LVL3 (GOWN DISPOSABLE) ×2 IMPLANT
GOWN STRL REUS W/TWL LRG LVL3 (GOWN DISPOSABLE) ×5
GOWN STRL REUS W/TWL XL LVL3 (GOWN DISPOSABLE) ×1
HEMOSTAT SPONGE AVITENE ULTRA (HEMOSTASIS) IMPLANT
KIT BASIN OR (CUSTOM PROCEDURE TRAY) ×1 IMPLANT
KIT TURNOVER KIT B (KITS) ×1 IMPLANT
NS IRRIG 1000ML POUR BTL (IV SOLUTION) ×1 IMPLANT
PACK CV ACCESS (CUSTOM PROCEDURE TRAY) ×1 IMPLANT
PAD ARMBOARD 7.5X6 YLW CONV (MISCELLANEOUS) ×2 IMPLANT
SLING ARM FOAM STRAP LRG (SOFTGOODS) IMPLANT
SLING ARM FOAM STRAP MED (SOFTGOODS) IMPLANT
SPIKE FLUID TRANSFER (MISCELLANEOUS) ×1 IMPLANT
SUT MNCRL AB 4-0 PS2 18 (SUTURE) ×1 IMPLANT
SUT PROLENE 6 0 BV (SUTURE) ×1 IMPLANT
SUT PROLENE 7 0 BV 1 (SUTURE) IMPLANT
SUT VIC AB 3-0 SH 27 (SUTURE) ×1
SUT VIC AB 3-0 SH 27X BRD (SUTURE) ×1 IMPLANT
TOWEL GREEN STERILE (TOWEL DISPOSABLE) ×1 IMPLANT
UNDERPAD 30X36 HEAVY ABSORB (UNDERPADS AND DIAPERS) ×1 IMPLANT
WATER STERILE IRR 1000ML POUR (IV SOLUTION) ×1 IMPLANT

## 2023-10-22 NOTE — Anesthesia Procedure Notes (Signed)
Anesthesia Regional Block: Supraclavicular block   Pre-Anesthetic Checklist: , timeout performed,  Correct Patient, Correct Site, Correct Laterality,  Correct Procedure, Correct Position, site marked,  Risks and benefits discussed,  Surgical consent,  Pre-op evaluation,  At surgeon's request and post-op pain management  Laterality: Left  Prep: chloraprep       Needles:  Injection technique: Single-shot  Needle Type: Echogenic Needle     Needle Length: 9cm  Needle Gauge: 21     Additional Needles:   Procedures:,,,, ultrasound used (permanent image in chart),,    Narrative:  Start time: 10/22/2023 7:27 AM End time: 10/22/2023 7:35 AM Injection made incrementally with aspirations every 5 mL.  Performed by: Personally  Anesthesiologist: Collene Schlichter, MD  Additional Notes: No pain on injection. No increased resistance to injection. Injection made in 5cc increments.  Good needle visualization.  Patient tolerated procedure well.

## 2023-10-22 NOTE — H&P (Signed)
Patient name: Nancy [name]         MRN: [id number]        DOB: 07-28-1956        Sex: female   REASON FOR CONSULT: Permanent hemodialysis access placement   HPI: Nancy [name] is a 67 y.o. female, with history of hypertension, diabetes, stage V CKD now ESRD and chronic hepatitis C that presents for evaluation of permanent hemodialysis access.  Patient states she is right-handed.  No previous access in the past.  Not currently on dialysis.        Past Medical History:  Diagnosis Date   Carpal tunnel syndrome, bilateral      confirmed on nerve conduction studies   Chronic hepatitis C      considered cured 06/2016 post retroviral therapy    Diabetes mellitus type 2, uncontrolled     Diabetic peripheral neuropathy     GERD (gastroesophageal reflux disease)     History of endometriosis     History of Helicobacter infection 11/07   Hyperlipidemia     Liver fibrosis 02/17/2016    F2/3                Past Surgical History:  Procedure Laterality Date   ABDOMINAL HYSTERECTOMY       CARPAL TUNNEL RELEASE                   Family History  Problem Relation Age of Onset   Cancer Mother          lung   Kidney disease Mother     Lung cancer Maternal Uncle     Breast cancer Maternal Grandmother     Liver cancer Maternal Grandfather     Heart disease Brother     Depression Brother     Diabetes Father     Dementia Father     Colon cancer Neg Hx            SOCIAL HISTORY: Social History         Socioeconomic History   Marital status: Single      Spouse name: Not on file   Number of children: 0   Years of education: 12   Highest education level: Not on file  Occupational History   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 There are not steps up to the entrance the patient uses.          Patient's method of transportation is via family member (mom or friend).         The highest level of education was high school diploma.         The patient currently disabled.         Identified important Relationships are "My mother"  Pets : None         Interests / Fun: "TV, movies"          Current Stressors: "People, my health"          Religious / Personal Beliefs: "[employer]ist"          L. Leward Quan, RN, BSN          Social Determinants of Health        Financial Resource Strain: Low Risk  (04/24/2022)    Overall Financial Resource Strain (CARDIA)     Difficulty of Paying Living Expenses: Not hard at all  Food Insecurity: No Food Insecurity (08/25/2022)    Hunger Vital Sign     Worried About Running Out of Food in the Last Year: Never true     Ran Out of Food in the Last Year: Never true  Transportation Needs: No Transportation Needs (08/25/2022)    PRAPARE - Therapist, art (Medical): No     Lack of Transportation (Non-Medical): No  Physical Activity: Not on file  Stress: No Stress Concern Present (04/24/2022)    [employer] of Occupational Health - Occupational Stress Questionnaire     Feeling of Stress : Not at all  Social Connections: Socially Isolated (04/24/2022)    Social Connection and Isolation Panel [NHANES]     Frequency of Communication with Friends and Family: Three times a week     Frequency of Social Gatherings with Friends and Family: Three times a week     Attends Religious Services: Never     Active Member of Clubs or Organizations: No     Attends Banker Meetings: Never      Marital Status: Divorced  Catering manager Violence: Not At Risk (04/24/2022)    Humiliation, Afraid, Rape, and Kick questionnaire     Fear of Current or Ex-Partner: No     Emotionally Abused: No     Physically Abused: No     Sexually Abused: No      Allergies       Allergies  Allergen Reactions   Ace Inhibitors Cough      Persistent dry cough   Amitriptyline Hcl Nausea And Vomiting   Aspirin Other (See Comments)   Latex Itching   Prednisone Nausea And Vomiting              Current Outpatient Medications  Medication Sig Dispense Refill   ACCU-CHEK FASTCLIX LANCETS MISC Use to check blood sugar up to 3 times a day 306 each 3   atorvastatin (LIPITOR) 40 MG tablet TAKE 1 TABLET BY MOUTH EVERY DAY 90 tablet 3   Blood Glucose Monitoring Suppl (ACCU-CHEK GUIDE) w/Device KIT 1 each by Does not apply route 3 (three) times daily. Check blood sugar 3 times a day 1 kit 0   Cholecalciferol (VITAMIN D3) 25 MCG (1000 UT) CAPS Take 1 capsule by mouth daily.       cyclobenzaprine (FLEXERIL) 10 MG tablet Take 1 tablet (10 mg total) by mouth at bedtime. 30 tablet 1   dapagliflozin propanediol (FARXIGA) 10 MG TABS tablet TAKE 1 TABLET BY MOUTH EVERY DAY BEFORE BREAKFAST 90 tablet 3   diclofenac Sodium (VOLTAREN) 1 % GEL Apply 2 g topically 4 (four) times daily. 100 g 2   diclofenac Sodium (VOLTAREN) 1 % GEL Apply 2 g topically 4 (four) times daily. 150 g 0   DILT-XR 240 MG 24 hr capsule Take 1 capsule (240 mg total)  by mouth daily. 90 capsule 2   Dulaglutide (TRULICITY) 1.5 MG/0.5ML SOPN Inject 1.5 mg into the skin once a week. 6 mL 3   glucose blood (ACCU-CHEK GUIDE) test strip Check blood sugar 3 times a day 300 each 3   insulin glargine (LANTUS) 100 UNIT/ML injection INJECT 25 UNITS INTO THE SKIN EVERY MORNING WHEN YOU WAKE UP 30 mL 3   Insulin Syringe-Needle U-100 31G X 15/64" 0.5 ML MISC Use to inject insulin one time a day 100 each 11   Insulin Syringes, Disposable, U-100 1 ML MISC 2  Syringes by Does not apply route daily. Use to inject insulin into the skin 1 time daily. diag code E11.9. Insulin dependent 100 each 3   LORazepam (ATIVAN) 1 MG tablet Sig 1 mg 1 hour before procedure as needed. 10 tablet 0   omeprazole (PRILOSEC) 40 MG capsule TAKE 1 CAPSULE BY MOUTH EVERY DAY 90 capsule 3   senna (SENOKOT) 8.6 MG tablet Take 1 tablet (8.6 mg total) by mouth as needed for constipation. 60 tablet 3               Current Facility-Administered Medications  Medication Dose Route Frequency Provider Last Rate Last Admin   diclofenac Sodium (VOLTAREN) 1 % topical gel 4 g  4 g Topical TID PRN Adron Bene, MD            REVIEW OF SYSTEMS:  [X]  denotes positive finding, [ ]  denotes negative finding Cardiac   Comments:  Chest pain or chest pressure:      Shortness of breath upon exertion:      Short of breath when lying flat:      Irregular heart rhythm:             Vascular      Pain in calf, thigh, or hip brought on by ambulation:      Pain in feet at night that wakes you up from your sleep:       Blood clot in your veins:      Leg swelling:              Pulmonary      Oxygen at home:      Productive cough:       Wheezing:              Neurologic      Sudden weakness in arms or legs:       Sudden numbness in arms or legs:       Sudden onset of difficulty speaking or slurred speech:      Temporary loss of vision in one eye:       Problems with dizziness:              Gastrointestinal      Blood in stool:       Vomited blood:              Genitourinary      Burning when urinating:       Blood in urine:             Psychiatric      Major depression:              Hematologic      Bleeding problems:      Problems with blood clotting too easily:             Skin      Rashes or ulcers:  Constitutional      Fever or chills:          PHYSICAL EXAM:    Vitals:    03/09/23 1156  BP: (!) 167/102  Pulse: 75  Resp: 14  Temp: 97.8 F  (36.6 C)  TempSrc: Temporal  SpO2: 98%  Weight: 152 lb (68.9 kg)  Height: 4\' 9"  (1.448 m)      GENERAL: The patient is a well-nourished female, in no acute distress. The vital signs are documented above. CARDIAC: There is a regular rate and rhythm.  VASCULAR:  Palpable radial pulses both upper extremities Palpable brachial pulses both upper extremities PULMONARY: No respiratory distress. ABDOMEN: Soft and non-tender. MUSCULOSKELETAL: There are no major deformities or cyanosis. NEUROLOGIC: No focal weakness or paresthesias are detected. SKIN: There are no ulcers or rashes noted. PSYCHIATRIC: The patient has a normal affect.   DATA:    Upper extremity arterial duplex shows triphasic waveforms in both upper extremities   Upper extremity vein mapping shows usable cephalic and basilic vein in the left arm   Assessment/Plan:    67 y.o. female, with history of hypertension, diabetes, stage V CKD now ESRD and chronic hepatitis C that presents for evaluation of permanent hemodialysis access.  I discussed hemodialysis access in her nondominant arm which would be her left arm.  She appears to have usable cephalic and basilic vein.  I discussed basic steps of AV fistula creation.  I discussed risk and benefits including risk of failure to mature and steal syndrome.  She wishes to proceed.  All questions answered.       [name], MD [facility] Office: [phone]

## 2023-10-22 NOTE — Transfer of Care (Signed)
Immediate Anesthesia Transfer of Care Note  Patient: Nancy [name]  Procedure(s) Performed: LEFT ARM BRACHIOCEPHALIC ARTERIOVENOUS (AV) FISTULA CREATION (Left)  Patient Location: PACU  Anesthesia Type:General  Level of Consciousness: awake and alert   Airway & Oxygen Therapy: Patient Spontanous Breathing and Patient connected to face mask oxygen  Post-op Assessment: Report given to RN and Post -op Vital signs reviewed and stable  Post vital signs: Reviewed and stable  Last Vitals:  Vitals Value Taken Time  BP 112/65 10/22/23 0936  Temp    Pulse 52 10/22/23 0938  Resp 21 10/22/23 0938  SpO2 94 % 10/22/23 0938  Vitals shown include unfiled device data.  Last Pain:  Vitals:   10/22/23 0630  PainSc: 0-No pain      Patients Stated Pain Goal: 0 (10/22/23 0630)  Complications: No notable events documented.

## 2023-10-22 NOTE — Discharge Instructions (Signed)
[facility]  Discharge Instructions  AV Fistula or Graft Surgery for Dialysis Access  Please refer to the following instructions for your post-procedure care. Your surgeon or physician assistant will discuss any changes with you.  Activity  You may drive the day following your surgery, if you are comfortable and no longer taking prescription pain medication. Resume full activity as the soreness in your incision resolves.  Bathing/Showering  You may shower after you go home. Keep your incision dry for 48 hours. Do not soak in a bathtub, hot tub, or swim until the incision heals completely. You may not shower if you have a hemodialysis catheter.  Incision Care  Clean your incision with mild soap and water after 48 hours. Pat the area dry with a clean towel. You do not need a bandage unless otherwise instructed. Do not apply any ointments or creams to your incision. You may have skin glue on your incision. Do not peel it off. It will come off on its own in about one week. Your arm may swell a bit after surgery. To reduce swelling use pillows to elevate your arm so it is above your heart. Your doctor will tell you if you need to lightly wrap your arm with an ACE bandage.  Diet  Resume your normal diet. There are not special food restrictions following this procedure. In order to heal from your surgery, it is CRITICAL to get adequate nutrition. Your body requires vitamins, minerals, and protein. Vegetables are the best source of vitamins and minerals. Vegetables also provide the perfect balance of protein. Processed food has little nutritional value, so try to avoid this.  Medications  Resume taking all of your medications. If your incision is causing pain, you may take over-the counter pain relievers such as acetaminophen (Tylenol). If you were prescribed a stronger pain medication, please be aware these medications can cause nausea and constipation. Prevent  nausea by taking the medication with a snack or meal. Avoid constipation by drinking plenty of fluids and eating foods with high amount of fiber, such as fruits, vegetables, and grains.  Do not take Tylenol if you are taking prescription pain medications.  Follow up Your surgeon may want to see you in the office following your access surgery. If so, this will be arranged at the time of your surgery.  Please call us immediately for any of the following conditions:  Increased pain, redness, drainage (pus) from your incision site Fever of 101 degrees or higher Severe or worsening pain at your incision site Hand pain or numbness.  Reduce your risk of vascular disease:  Stop smoking. If you would like help, call [facility] at 1-800-QUIT-NOW (7634832593) or [place] at [phone]  Manage your cholesterol Maintain a desired weight Control your diabetes Keep your blood pressure down  Dialysis  It will take several weeks to several months for your new dialysis access to be ready for use. Your surgeon will determine when it is okay to use it. Your nephrologist will continue to direct your dialysis. You can continue to use your Permcath until your new access is ready for use.   10/22/2023 Nancy [name] [id number] Aug 08, 1956  Surgeon(s): [name], MD  Procedure(s): LEFT ARM BRACHIOCEPHALIC ARTERIOVENOUS (AV) FISTULA CREATION   May stick graft immediately   May stick graft on designated area only:   x Do not stick fistula for 12 weeks    If you have any questions, please call the office at [phone].

## 2023-10-22 NOTE — Op Note (Signed)
OPERATIVE NOTE   DATE: October 22, 2023  PROCEDURE: left brachiocephalic arteriovenous fistula placement  PRE-OPERATIVE DIAGNOSIS: ESRD  POST-OPERATIVE DIAGNOSIS: same as above   SURGEON: [name], MD  ASSISTANT(S): Loel Dubonnet, PA  ANESTHESIA: regional  ESTIMATED BLOOD LOSS: Minimal  FINDING(S): 1.  Cephalic vein: 3.5 mm, acceptable 2.  Brachial artery: 4 mm, atherosclerotic disease evident 3.  Venous outflow: palpable thrill  4.  Radial flow: dopplerable radial signal  SPECIMEN(S):  none  INDICATIONS:   Nancy [name] is a 67 y.o. female who presents with ESRD.  The patient is scheduled for left arm arteriovenous fistula placement.  The patient is aware the risks include but are not limited to: bleeding, infection, steal syndrome, nerve damage, ischemic monomelic neuropathy, failure to mature, and need for additional procedures.  The patient is aware of the risks of the procedure and elects to proceed forward.   DESCRIPTION: After full informed written consent was obtained from the patient, the patient was brought back to the operating room and placed supine upon the operating table.  Prior to induction, the patient received IV antibiotics.   After obtaining adequate anesthesia, the patient was then prepped and draped in the standard fashion for a left arm access procedure.  I turned my attention first to identifying the patient's cephalic vein and brachial artery.  Using SonoSite guidance, the location of these vessels were marked out on the skin.   The cephalic vein looked to be of adequate caliber at the elbow.  I made a transverse incision at the level of the antecubitum and dissected through the subcutaneous tissue and fascia to gain exposure of the brachial artery.  This was noted to be 4 mm in diameter externally.  This was dissected out proximally and distally and controlled with vessel loops .  I then dissected out the cephalic vein.  This was noted  to be 3.5 mm in diameter externally.  The distal segment of the vein was ligated with a  2-0 silk, and the vein was transected.  The proximal segment was interrogated with serial dilators.  The vein accepted up to a 5 mm dilator without any difficulty.  I then instilled the heparinized saline into the vein and clamped it.  At this point, I reset my exposure of the brachial artery.  The patient was given 3,000 units IV heparin.  I then placed the artery under tension proximally and distally.  I made an arteriotomy with a #11 blade, and then I extended the arteriotomy with a Potts scissor.  I injected heparinized saline proximal and distal to this arteriotomy.  The vein was then sewn to the artery in an end-to-side configuration with a running stitch of 6-0 Prolene.  Prior to completing this anastomosis, I allowed the vein and artery to backbleed.  There was no evidence of clot from any vessels.  I completed the anastomosis in the usual fashion and then released all vessel loops and clamps.  I was not happy with the thrill in the fistula and we lost the thrill on the table.  Ultimately I reclamped the brachial artery.  I took down the anastomosis with 11 blade scalpel.  I did not see any clot.  I cut back the cephalic vein to where it appeared healthier.  A new end to side anastomosis was sewn to the brachial artery with 6-0 Prolene.  We had a much better thrill.  I did de-air the artery prior to completion.  There was a  palpable thrill in the venous outflow, and there was a dopplerable radial signal.  At this point, I irrigated out the surgical wound.  There was no further active bleeding.  The subcutaneous tissue was reapproximated with a running stitch of 3-0 Vicryl.  The skin was then reapproximated with a running subcuticular stitch of 4-0 Monocryl.  The skin was then cleaned, dried, and reinforced with Dermabond.  The patient tolerated this procedure well.   COMPLICATIONS: None  CONDITION:  Stable  [name], MD [facility] Office: [phone]  [name]   10/22/2023, 9:18 AM

## 2023-10-25 ENCOUNTER — Encounter (HOSPITAL_COMMUNITY): Payer: Self-pay | Admitting: Vascular Surgery

## 2023-10-25 DIAGNOSIS — N186 End stage renal disease: Secondary | ICD-10-CM | POA: Diagnosis not present

## 2023-10-25 DIAGNOSIS — T8249XA Other complication of vascular dialysis catheter, initial encounter: Secondary | ICD-10-CM | POA: Diagnosis not present

## 2023-10-25 DIAGNOSIS — E1122 Type 2 diabetes mellitus with diabetic chronic kidney disease: Secondary | ICD-10-CM | POA: Diagnosis not present

## 2023-10-25 DIAGNOSIS — Z992 Dependence on renal dialysis: Secondary | ICD-10-CM | POA: Diagnosis not present

## 2023-10-25 DIAGNOSIS — D509 Iron deficiency anemia, unspecified: Secondary | ICD-10-CM | POA: Diagnosis not present

## 2023-10-25 DIAGNOSIS — N2581 Secondary hyperparathyroidism of renal origin: Secondary | ICD-10-CM | POA: Diagnosis not present

## 2023-10-25 DIAGNOSIS — D631 Anemia in chronic kidney disease: Secondary | ICD-10-CM | POA: Diagnosis not present

## 2023-10-25 NOTE — Anesthesia Postprocedure Evaluation (Signed)
Anesthesia Post Note  Patient: Nancy [name]  Procedure(s) Performed: LEFT ARM BRACHIOCEPHALIC ARTERIOVENOUS (AV) FISTULA CREATION (Left)     Patient location during evaluation: PACU Anesthesia Type: Regional Level of consciousness: awake and alert Pain management: pain level controlled Vital Signs Assessment: post-procedure vital signs reviewed and stable Respiratory status: spontaneous breathing, nonlabored ventilation, respiratory function stable and patient connected to nasal cannula oxygen Cardiovascular status: stable and blood pressure returned to baseline Postop Assessment: no apparent nausea or vomiting Anesthetic complications: no   No notable events documented.  Last Vitals:  Vitals:   10/22/23 0945 10/22/23 1000  BP: 117/66 124/69  Pulse: (!) 50 (!) 47  Resp: (!) 22 (!) 21  Temp:  36.5 C  SpO2: 93% 93%    Last Pain:  Vitals:   10/22/23 0937  PainSc: 0-No pain   Pain Goal: Patients Stated Pain Goal: 0 (10/22/23 0630)                 Collene Schlichter

## 2023-10-27 DIAGNOSIS — E1122 Type 2 diabetes mellitus with diabetic chronic kidney disease: Secondary | ICD-10-CM | POA: Diagnosis not present

## 2023-10-27 DIAGNOSIS — N2581 Secondary hyperparathyroidism of renal origin: Secondary | ICD-10-CM | POA: Diagnosis not present

## 2023-10-27 DIAGNOSIS — Z992 Dependence on renal dialysis: Secondary | ICD-10-CM | POA: Diagnosis not present

## 2023-10-27 DIAGNOSIS — D509 Iron deficiency anemia, unspecified: Secondary | ICD-10-CM | POA: Diagnosis not present

## 2023-10-27 DIAGNOSIS — D631 Anemia in chronic kidney disease: Secondary | ICD-10-CM | POA: Diagnosis not present

## 2023-10-27 DIAGNOSIS — T8249XA Other complication of vascular dialysis catheter, initial encounter: Secondary | ICD-10-CM | POA: Diagnosis not present

## 2023-10-27 DIAGNOSIS — N186 End stage renal disease: Secondary | ICD-10-CM | POA: Diagnosis not present

## 2023-10-29 DIAGNOSIS — N2581 Secondary hyperparathyroidism of renal origin: Secondary | ICD-10-CM | POA: Diagnosis not present

## 2023-10-29 DIAGNOSIS — D631 Anemia in chronic kidney disease: Secondary | ICD-10-CM | POA: Diagnosis not present

## 2023-10-29 DIAGNOSIS — Z992 Dependence on renal dialysis: Secondary | ICD-10-CM | POA: Diagnosis not present

## 2023-10-29 DIAGNOSIS — N186 End stage renal disease: Secondary | ICD-10-CM | POA: Diagnosis not present

## 2023-10-29 DIAGNOSIS — D509 Iron deficiency anemia, unspecified: Secondary | ICD-10-CM | POA: Diagnosis not present

## 2023-10-29 DIAGNOSIS — T8249XA Other complication of vascular dialysis catheter, initial encounter: Secondary | ICD-10-CM | POA: Diagnosis not present

## 2023-10-29 DIAGNOSIS — E1122 Type 2 diabetes mellitus with diabetic chronic kidney disease: Secondary | ICD-10-CM | POA: Diagnosis not present

## 2023-10-31 DIAGNOSIS — E1122 Type 2 diabetes mellitus with diabetic chronic kidney disease: Secondary | ICD-10-CM | POA: Diagnosis not present

## 2023-10-31 DIAGNOSIS — N186 End stage renal disease: Secondary | ICD-10-CM | POA: Diagnosis not present

## 2023-10-31 DIAGNOSIS — T8249XA Other complication of vascular dialysis catheter, initial encounter: Secondary | ICD-10-CM | POA: Diagnosis not present

## 2023-10-31 DIAGNOSIS — N2581 Secondary hyperparathyroidism of renal origin: Secondary | ICD-10-CM | POA: Diagnosis not present

## 2023-10-31 DIAGNOSIS — D631 Anemia in chronic kidney disease: Secondary | ICD-10-CM | POA: Diagnosis not present

## 2023-10-31 DIAGNOSIS — Z992 Dependence on renal dialysis: Secondary | ICD-10-CM | POA: Diagnosis not present

## 2023-10-31 DIAGNOSIS — D509 Iron deficiency anemia, unspecified: Secondary | ICD-10-CM | POA: Diagnosis not present

## 2023-11-01 ENCOUNTER — Other Ambulatory Visit: Payer: Self-pay | Admitting: *Deleted

## 2023-11-01 DIAGNOSIS — N185 Chronic kidney disease, stage 5: Secondary | ICD-10-CM

## 2023-11-02 DIAGNOSIS — N2581 Secondary hyperparathyroidism of renal origin: Secondary | ICD-10-CM | POA: Diagnosis not present

## 2023-11-02 DIAGNOSIS — Z992 Dependence on renal dialysis: Secondary | ICD-10-CM | POA: Diagnosis not present

## 2023-11-02 DIAGNOSIS — D631 Anemia in chronic kidney disease: Secondary | ICD-10-CM | POA: Diagnosis not present

## 2023-11-02 DIAGNOSIS — T8249XA Other complication of vascular dialysis catheter, initial encounter: Secondary | ICD-10-CM | POA: Diagnosis not present

## 2023-11-02 DIAGNOSIS — E1122 Type 2 diabetes mellitus with diabetic chronic kidney disease: Secondary | ICD-10-CM | POA: Diagnosis not present

## 2023-11-02 DIAGNOSIS — D509 Iron deficiency anemia, unspecified: Secondary | ICD-10-CM | POA: Diagnosis not present

## 2023-11-02 DIAGNOSIS — N186 End stage renal disease: Secondary | ICD-10-CM | POA: Diagnosis not present

## 2023-11-05 DIAGNOSIS — Z992 Dependence on renal dialysis: Secondary | ICD-10-CM | POA: Diagnosis not present

## 2023-11-05 DIAGNOSIS — N186 End stage renal disease: Secondary | ICD-10-CM | POA: Diagnosis not present

## 2023-11-05 DIAGNOSIS — T8249XA Other complication of vascular dialysis catheter, initial encounter: Secondary | ICD-10-CM | POA: Diagnosis not present

## 2023-11-05 DIAGNOSIS — D509 Iron deficiency anemia, unspecified: Secondary | ICD-10-CM | POA: Diagnosis not present

## 2023-11-05 DIAGNOSIS — D631 Anemia in chronic kidney disease: Secondary | ICD-10-CM | POA: Diagnosis not present

## 2023-11-05 DIAGNOSIS — N2581 Secondary hyperparathyroidism of renal origin: Secondary | ICD-10-CM | POA: Diagnosis not present

## 2023-11-05 DIAGNOSIS — E1122 Type 2 diabetes mellitus with diabetic chronic kidney disease: Secondary | ICD-10-CM | POA: Diagnosis not present

## 2023-11-06 DIAGNOSIS — N186 End stage renal disease: Secondary | ICD-10-CM | POA: Diagnosis not present

## 2023-11-06 DIAGNOSIS — E1122 Type 2 diabetes mellitus with diabetic chronic kidney disease: Secondary | ICD-10-CM | POA: Diagnosis not present

## 2023-11-06 DIAGNOSIS — Z992 Dependence on renal dialysis: Secondary | ICD-10-CM | POA: Diagnosis not present

## 2023-11-08 DIAGNOSIS — T8249XA Other complication of vascular dialysis catheter, initial encounter: Secondary | ICD-10-CM | POA: Diagnosis not present

## 2023-11-08 DIAGNOSIS — N2581 Secondary hyperparathyroidism of renal origin: Secondary | ICD-10-CM | POA: Diagnosis not present

## 2023-11-08 DIAGNOSIS — Z992 Dependence on renal dialysis: Secondary | ICD-10-CM | POA: Diagnosis not present

## 2023-11-08 DIAGNOSIS — D509 Iron deficiency anemia, unspecified: Secondary | ICD-10-CM | POA: Diagnosis not present

## 2023-11-08 DIAGNOSIS — D631 Anemia in chronic kidney disease: Secondary | ICD-10-CM | POA: Diagnosis not present

## 2023-11-08 DIAGNOSIS — N186 End stage renal disease: Secondary | ICD-10-CM | POA: Diagnosis not present

## 2023-11-08 DIAGNOSIS — E1122 Type 2 diabetes mellitus with diabetic chronic kidney disease: Secondary | ICD-10-CM | POA: Diagnosis not present

## 2023-11-10 DIAGNOSIS — E1122 Type 2 diabetes mellitus with diabetic chronic kidney disease: Secondary | ICD-10-CM | POA: Diagnosis not present

## 2023-11-10 DIAGNOSIS — N2581 Secondary hyperparathyroidism of renal origin: Secondary | ICD-10-CM | POA: Diagnosis not present

## 2023-11-10 DIAGNOSIS — N186 End stage renal disease: Secondary | ICD-10-CM | POA: Diagnosis not present

## 2023-11-10 DIAGNOSIS — Z992 Dependence on renal dialysis: Secondary | ICD-10-CM | POA: Diagnosis not present

## 2023-11-10 DIAGNOSIS — D509 Iron deficiency anemia, unspecified: Secondary | ICD-10-CM | POA: Diagnosis not present

## 2023-11-10 DIAGNOSIS — T8249XA Other complication of vascular dialysis catheter, initial encounter: Secondary | ICD-10-CM | POA: Diagnosis not present

## 2023-11-10 DIAGNOSIS — D631 Anemia in chronic kidney disease: Secondary | ICD-10-CM | POA: Diagnosis not present

## 2023-11-12 DIAGNOSIS — D509 Iron deficiency anemia, unspecified: Secondary | ICD-10-CM | POA: Diagnosis not present

## 2023-11-12 DIAGNOSIS — T8249XA Other complication of vascular dialysis catheter, initial encounter: Secondary | ICD-10-CM | POA: Diagnosis not present

## 2023-11-12 DIAGNOSIS — Z992 Dependence on renal dialysis: Secondary | ICD-10-CM | POA: Diagnosis not present

## 2023-11-12 DIAGNOSIS — E1122 Type 2 diabetes mellitus with diabetic chronic kidney disease: Secondary | ICD-10-CM | POA: Diagnosis not present

## 2023-11-12 DIAGNOSIS — D631 Anemia in chronic kidney disease: Secondary | ICD-10-CM | POA: Diagnosis not present

## 2023-11-12 DIAGNOSIS — N186 End stage renal disease: Secondary | ICD-10-CM | POA: Diagnosis not present

## 2023-11-12 DIAGNOSIS — N2581 Secondary hyperparathyroidism of renal origin: Secondary | ICD-10-CM | POA: Diagnosis not present

## 2023-11-15 DIAGNOSIS — N186 End stage renal disease: Secondary | ICD-10-CM | POA: Diagnosis not present

## 2023-11-15 DIAGNOSIS — D631 Anemia in chronic kidney disease: Secondary | ICD-10-CM | POA: Diagnosis not present

## 2023-11-15 DIAGNOSIS — Z992 Dependence on renal dialysis: Secondary | ICD-10-CM | POA: Diagnosis not present

## 2023-11-15 DIAGNOSIS — T8249XA Other complication of vascular dialysis catheter, initial encounter: Secondary | ICD-10-CM | POA: Diagnosis not present

## 2023-11-15 DIAGNOSIS — D509 Iron deficiency anemia, unspecified: Secondary | ICD-10-CM | POA: Diagnosis not present

## 2023-11-15 DIAGNOSIS — E1122 Type 2 diabetes mellitus with diabetic chronic kidney disease: Secondary | ICD-10-CM | POA: Diagnosis not present

## 2023-11-15 DIAGNOSIS — N2581 Secondary hyperparathyroidism of renal origin: Secondary | ICD-10-CM | POA: Diagnosis not present

## 2023-11-16 ENCOUNTER — Ambulatory Visit (HOSPITAL_COMMUNITY)
Admission: RE | Admit: 2023-11-16 | Discharge: 2023-11-16 | Disposition: A | Discharge status: Home / Self Care | Payer: 59 | Source: Ambulatory Visit | Attending: Vascular Surgery | Admitting: Vascular Surgery

## 2023-11-16 DIAGNOSIS — N185 Chronic kidney disease, stage 5: Secondary | ICD-10-CM | POA: Insufficient documentation

## 2023-11-17 DIAGNOSIS — N186 End stage renal disease: Secondary | ICD-10-CM | POA: Diagnosis not present

## 2023-11-17 DIAGNOSIS — Z992 Dependence on renal dialysis: Secondary | ICD-10-CM | POA: Diagnosis not present

## 2023-11-17 DIAGNOSIS — E1122 Type 2 diabetes mellitus with diabetic chronic kidney disease: Secondary | ICD-10-CM | POA: Diagnosis not present

## 2023-11-17 DIAGNOSIS — D509 Iron deficiency anemia, unspecified: Secondary | ICD-10-CM | POA: Diagnosis not present

## 2023-11-17 DIAGNOSIS — N2581 Secondary hyperparathyroidism of renal origin: Secondary | ICD-10-CM | POA: Diagnosis not present

## 2023-11-17 DIAGNOSIS — T8249XA Other complication of vascular dialysis catheter, initial encounter: Secondary | ICD-10-CM | POA: Diagnosis not present

## 2023-11-17 DIAGNOSIS — D631 Anemia in chronic kidney disease: Secondary | ICD-10-CM | POA: Diagnosis not present

## 2023-11-19 DIAGNOSIS — D631 Anemia in chronic kidney disease: Secondary | ICD-10-CM | POA: Diagnosis not present

## 2023-11-19 DIAGNOSIS — D509 Iron deficiency anemia, unspecified: Secondary | ICD-10-CM | POA: Diagnosis not present

## 2023-11-19 DIAGNOSIS — E1122 Type 2 diabetes mellitus with diabetic chronic kidney disease: Secondary | ICD-10-CM | POA: Diagnosis not present

## 2023-11-19 DIAGNOSIS — N186 End stage renal disease: Secondary | ICD-10-CM | POA: Diagnosis not present

## 2023-11-19 DIAGNOSIS — T8249XA Other complication of vascular dialysis catheter, initial encounter: Secondary | ICD-10-CM | POA: Diagnosis not present

## 2023-11-19 DIAGNOSIS — Z992 Dependence on renal dialysis: Secondary | ICD-10-CM | POA: Diagnosis not present

## 2023-11-19 DIAGNOSIS — N2581 Secondary hyperparathyroidism of renal origin: Secondary | ICD-10-CM | POA: Diagnosis not present

## 2023-11-22 DIAGNOSIS — N2581 Secondary hyperparathyroidism of renal origin: Secondary | ICD-10-CM | POA: Diagnosis not present

## 2023-11-22 DIAGNOSIS — Z992 Dependence on renal dialysis: Secondary | ICD-10-CM | POA: Diagnosis not present

## 2023-11-22 DIAGNOSIS — E1122 Type 2 diabetes mellitus with diabetic chronic kidney disease: Secondary | ICD-10-CM | POA: Diagnosis not present

## 2023-11-22 DIAGNOSIS — T8249XA Other complication of vascular dialysis catheter, initial encounter: Secondary | ICD-10-CM | POA: Diagnosis not present

## 2023-11-22 DIAGNOSIS — D631 Anemia in chronic kidney disease: Secondary | ICD-10-CM | POA: Diagnosis not present

## 2023-11-22 DIAGNOSIS — D509 Iron deficiency anemia, unspecified: Secondary | ICD-10-CM | POA: Diagnosis not present

## 2023-11-22 DIAGNOSIS — N186 End stage renal disease: Secondary | ICD-10-CM | POA: Diagnosis not present

## 2023-11-22 NOTE — Progress Notes (Signed)
POST OPERATIVE OFFICE NOTE    CC:  F/u for surgery  HPI:  This is a 67 y.o. female who is s/p left BC AVF on 10/22/2023 by Dr. Chestine Spore.   Pt denies pain or coldness in the left hand.  She believes her incision is well-healed.  She is currently dialyzing via right IJ [facility] ([place] on a Monday Wednesday Friday schedule at the [facility] kidney center in [place].  ESRD is managed by Dr. Malen Gauze.    Allergies  Allergen Reactions   Ace Inhibitors Other (See Comments) and Cough    Persistent dry cough   Amitriptyline Hcl Nausea And Vomiting and Other (See Comments)    GI Intolerance/Upset stomach   Aspirin Other (See Comments)    GI Intolerance   Latex Itching   Prednisone Nausea And Vomiting    Current Outpatient Medications  Medication Sig Dispense Refill   ACCU-CHEK FASTCLIX LANCETS MISC Use to check blood sugar up to 3 times a day 306 each 3   ACCU-CHEK GUIDE test strip CHECK BLOOD SUGAR 3 TIMES A DAY 300 strip 3   atorvastatin (LIPITOR) 40 MG tablet TAKE 1 TABLET BY MOUTH EVERY DAY 90 tablet 3   Blood Glucose Monitoring Suppl (ACCU-CHEK GUIDE) w/Device KIT 1 each by Does not apply route 3 (three) times daily. Check blood sugar 3 times a day 1 kit 0   Blood Pressure Monitoring (ADVANCED ONE STEP BP MONITOR) MISC 1 Units by Does not apply route daily. 1 each 0   Cholecalciferol (VITAMIN D3) 25 MCG (1000 UT) CAPS Take 1 capsule by mouth daily.     Continuous Glucose Receiver (DEXCOM G7 RECEIVER) DEVI To check blood sugars twice a day 1 each 3   Continuous Glucose Sensor (DEXCOM G7 SENSOR) MISC Use to check blood sugars twice a day 3 each 3   cyclobenzaprine (FLEXERIL) 10 MG tablet TAKE 1 TABLET BY MOUTH EVERYDAY AT BEDTIME 30 tablet 1   diclofenac Sodium (VOLTAREN) 1 % GEL Apply 2 g topically 4 (four) times daily. (Patient not taking: Reported on 10/21/2023) 150 g 0   diltiazem (CARDIZEM CD) 360 MG 24 hr capsule Take 1 capsule (360 mg total) by mouth daily. 90 capsule 3   ezetimibe  (ZETIA) 10 MG tablet Take 1 tablet (10 mg total) by mouth daily. 90 tablet 3   Glucagon (GVOKE HYPOPEN 2-PACK) 0.5 MG/0.1ML SOAJ Inject 0.5 mg into the skin as needed. 0.2 mL 3   insulin glargine (LANTUS) 100 UNIT/ML injection Inject 0.1 mLs (10 Units total) into the skin daily. 3 mL 0   insulin lispro (HUMALOG) 100 UNIT/ML KwikPen For sugar 120 to 150 use one unit, for 151 to 200 use two units, for 201 to 250 use three units, for 251 to 300 use five units, for 301 to 350 use seven units, for 351 to 400 use 9 units, if sugar more than 400 call your primary care provider. 15 mL 0   Insulin Pen Needle (PEN NEEDLES) 31G X 5 MM MISC 1 each by Does not apply route 3 (three) times daily with meals. May substitute to any manufacturer covered by patient's insurance. 100 each 0   Insulin Syringe-Needle U-100 31G X 15/64" 0.5 ML MISC Use to inject insulin one time a day 100 each 0   Insulin Syringes, Disposable, U-100 1 ML MISC 2 Syringes by Does not apply route daily. Use to inject insulin into the skin 1 time daily. diag code E11.9. Insulin dependent 100 each 0  omeprazole (PRILOSEC) 40 MG capsule TAKE 1 CAPSULE BY MOUTH EVERY DAY 90 capsule 3   oxyCODONE (ROXICODONE) 5 MG immediate release tablet Take 1 tablet (5 mg total) by mouth every 4 (four) hours as needed for severe pain (pain score 7-10). 10 tablet 0   senna (SENOKOT) 8.6 MG tablet Take 1 tablet (8.6 mg total) by mouth as needed for constipation. 60 tablet 3   No current facility-administered medications for this visit.     ROS:  See HPI  Physical Exam:  Vitals:   11/23/23 0935  BP: (!) 163/80  Pulse: 67  Resp: 18  Temp: 97.9 F (36.6 C)  SpO2: 99%     Incision: Well-healed left arm incision Extremities:   There is a palpable radial pulse.   Motor and sensory in tact.   There is a thrill/bruit present throughout the upper arm   Dialysis Duplex on 11/16/2023: +--------------------+----------+-----------------+--------+  AVF                 PSV (cm/s)Flow Vol (mL/min)Comments  +--------------------+----------+-----------------+--------+  Native artery inflow   123           514                 +--------------------+----------+-----------------+--------+  AVF Anastomosis        666                               +--------------------+----------+-----------------+--------+   +------------+----------+-------------+----------+--------+  OUTFLOW VEINPSV (cm/s)Diameter (cm)Depth (cm)Describe  +------------+----------+-------------+----------+--------+  Prox UA        180        0.42        0.83             +------------+----------+-------------+----------+--------+  Mid UA         104        0.56        0.51             +------------+----------+-------------+----------+--------+  Dist UA         91        0.59        0.51             +------------+----------+-------------+----------+--------+   Summary:  Patent arteriovenous fistula.  Arteriovenous fistula-Velocities less than 100cm/s noted.    Assessment/Plan:  This is a 67 y.o. female who is s/p: left BC AVF on 10/22/2023 by Dr. Chestine Spore.   Patent left brachiocephalic fistula with a palpable thrill throughout the upper arm.  Duplex demonstrates low flow velocities in the arm however based on physical exam we will allow the fistula to mature.  We will recheck a duplex in another month.  I discussed with the patient she may need a fistulogram based on results however if she continues to have a palpable thrill with adequate maturation, fistula can be cannulized mid February.  I provided her with a stress ball to exercise her left hand when resting during the day.  We will see her back with fistula duplex in 1 month.  Continue HD via right IJ [facility] for now.   Emilie Rutter, [facility] [phone]  Clinic MD:  Chestine Spore

## 2023-11-23 ENCOUNTER — Encounter: Payer: Self-pay | Admitting: Emergency Medicine

## 2023-11-23 ENCOUNTER — Ambulatory Visit: Payer: 59 | Admitting: Emergency Medicine

## 2023-11-23 ENCOUNTER — Ambulatory Visit (INDEPENDENT_AMBULATORY_CARE_PROVIDER_SITE_OTHER): Payer: 59 | Admitting: Physician Assistant

## 2023-11-23 ENCOUNTER — Ambulatory Visit (INDEPENDENT_AMBULATORY_CARE_PROVIDER_SITE_OTHER): Payer: 59 | Admitting: Emergency Medicine

## 2023-11-23 VITALS — BP 132/80 | Temp 98.6°F | Ht <= 58 in | Wt 150.0 lb

## 2023-11-23 VITALS — BP 163/80 | HR 67 | Temp 97.9°F | Resp 18 | Ht <= 58 in | Wt 150.0 lb

## 2023-11-23 DIAGNOSIS — R1030 Lower abdominal pain, unspecified: Secondary | ICD-10-CM | POA: Diagnosis not present

## 2023-11-23 DIAGNOSIS — E785 Hyperlipidemia, unspecified: Secondary | ICD-10-CM | POA: Diagnosis not present

## 2023-11-23 DIAGNOSIS — Z794 Long term (current) use of insulin: Secondary | ICD-10-CM | POA: Diagnosis not present

## 2023-11-23 DIAGNOSIS — E1159 Type 2 diabetes mellitus with other circulatory complications: Secondary | ICD-10-CM

## 2023-11-23 DIAGNOSIS — E1169 Type 2 diabetes mellitus with other specified complication: Secondary | ICD-10-CM

## 2023-11-23 DIAGNOSIS — N185 Chronic kidney disease, stage 5: Secondary | ICD-10-CM

## 2023-11-23 DIAGNOSIS — N186 End stage renal disease: Secondary | ICD-10-CM

## 2023-11-23 DIAGNOSIS — I1311 Hypertensive heart and chronic kidney disease without heart failure, with stage 5 chronic kidney disease, or end stage renal disease: Secondary | ICD-10-CM

## 2023-11-23 DIAGNOSIS — I152 Hypertension secondary to endocrine disorders: Secondary | ICD-10-CM | POA: Diagnosis not present

## 2023-11-23 DIAGNOSIS — Z992 Dependence on renal dialysis: Secondary | ICD-10-CM

## 2023-11-23 MED ORDER — DICYCLOMINE HCL 20 MG PO TABS: 20.0000 mg | ORAL_TABLET | Freq: Four times a day (QID) | ORAL | 1 refills | Status: DC | PRN

## 2023-11-23 NOTE — Assessment & Plan Note (Signed)
Clinically stable.  No red flag signs or symptoms Differential diagnosis discussed Benign physical examination.  Afebrile. Symptoms follow dialysis sessions.  May be related. Recommend Bentyl 20 mg every 6-8 hours as needed. Diet and nutrition discussed. ED precautions given.

## 2023-11-23 NOTE — Progress Notes (Signed)
Caliyah Tews 67 y.o.   Chief Complaint  Patient presents with   Abdominal Pain    Stomach pain for 2-3 weeks all on the abdominal area but more on the left side.     HISTORY OF PRESENT ILLNESS: This is a 66 y.o. female complaining of diffuse lower abdominal pain, mostly left-sided, on and off for the past 3 to 4 weeks. Trigger by dialysis.  States symptoms are worse after dialysis sessions. Still able to eat and drink.  Denies nausea or vomiting. Moving bowels okay.  Denies rectal bleeding Has good appetite.  Denies fever or chills No other associated symptoms No other complaints or medical concerns today.  HPI   Prior to Admission medications   Medication Sig Start Date End Date Taking? Authorizing Provider  ACCU-CHEK FASTCLIX LANCETS MISC Use to check blood sugar up to 3 times a day 10/11/18  Yes Eulah Pont, MD  ACCU-CHEK GUIDE test strip CHECK BLOOD SUGAR 3 TIMES A DAY 03/30/23  Yes Georgina Quint, MD  atorvastatin (LIPITOR) 40 MG tablet TAKE 1 TABLET BY MOUTH EVERY DAY 03/30/23  Yes Lamae Fosco, [name], MD  Blood Glucose Monitoring Suppl (ACCU-CHEK GUIDE) w/Device KIT 1 each by Does not apply route 3 (three) times daily. Check blood sugar 3 times a day 06/19/23  Yes Arrien, York Ram, MD  Blood Pressure Monitoring (ADVANCED ONE STEP BP MONITOR) MISC 1 Units by Does not apply route daily. 07/26/23  Yes Chandrasekhar, Mahesh A, MD  Cholecalciferol (VITAMIN D3) 25 MCG (1000 UT) CAPS Take 1 capsule by mouth daily.   Yes [provider]  Continuous Glucose Receiver (DEXCOM G7 RECEIVER) DEVI To check blood sugars twice a day 06/23/23  Yes Nickie Warwick, [name], MD  Continuous Glucose Sensor (DEXCOM G7 SENSOR) MISC Use to check blood sugars twice a day 06/23/23  Yes Glenroy Crossen, [name], MD  cyclobenzaprine (FLEXERIL) 10 MG tablet TAKE 1 TABLET BY MOUTH EVERYDAY AT BEDTIME 07/12/23  Yes Corwin Levins, MD  diclofenac Sodium (VOLTAREN) 1 % GEL Apply 2 g topically 4  (four) times daily. 01/06/22  Yes Adron Bene, MD  diltiazem (CARDIZEM CD) 360 MG 24 hr capsule Take 1 capsule (360 mg total) by mouth daily. 06/30/23  Yes Chandrasekhar, Mahesh A, MD  Glucagon (GVOKE HYPOPEN 2-PACK) 0.5 MG/0.1ML SOAJ Inject 0.5 mg into the skin as needed. 08/17/23  Yes Desta Bujak, [name], MD  insulin lispro (HUMALOG) 100 UNIT/ML KwikPen For sugar 120 to 150 use one unit, for 151 to 200 use two units, for 201 to 250 use three units, for 251 to 300 use five units, for 301 to 350 use seven units, for 351 to 400 use 9 units, if sugar more than 400 call your primary care provider. 06/19/23  Yes Arrien, York Ram, MD  Insulin Pen Needle (PEN NEEDLES) 31G X 5 MM MISC 1 each by Does not apply route 3 (three) times daily with meals. May substitute to any manufacturer covered by patient's insurance. 06/19/23  Yes Arrien, York Ram, MD  Insulin Syringe-Needle U-100 31G X 15/64" 0.5 ML MISC Use to inject insulin one time a day 06/19/23  Yes Arrien, York Ram, MD  Insulin Syringes, Disposable, U-100 1 ML MISC 2 Syringes by Does not apply route daily. Use to inject insulin into the skin 1 time daily. diag code E11.9. Insulin dependent 06/19/23  Yes Arrien, York Ram, MD  omeprazole (PRILOSEC) 40 MG capsule TAKE 1 CAPSULE BY MOUTH EVERY DAY 03/30/23  Yes Kaycen Whitworth, [place],  MD  oxyCODONE (ROXICODONE) 5 MG immediate release tablet Take 1 tablet (5 mg total) by mouth every 4 (four) hours as needed for severe pain (pain score 7-10). 10/22/23  Yes Schuh, McKenzi P, PA-C  senna (SENOKOT) 8.6 MG tablet Take 1 tablet (8.6 mg total) by mouth as needed for constipation. 07/08/20  Yes Briscoe Burns, MD  ezetimibe (ZETIA) 10 MG tablet Take 1 tablet (10 mg total) by mouth daily. 07/26/23 10/24/23  Riley Lam A, MD  insulin glargine (LANTUS) 100 UNIT/ML injection Inject 0.1 mLs (10 Units total) into the skin daily. 06/19/23 10/22/23  Arrien, York Ram, MD    Allergies   Allergen Reactions   Ace Inhibitors Other (See Comments) and Cough    Persistent dry cough   Amitriptyline Hcl Nausea And Vomiting and Other (See Comments)    GI Intolerance/Upset stomach   Aspirin Other (See Comments)    GI Intolerance   Latex Itching   Prednisone Nausea And Vomiting    Patient Active Problem List   Diagnosis Date Noted   Hypertensive heart and chronic kidney disease stage 5 (HCC) 06/30/2023   Chronic diastolic CHF (congestive heart failure) (HCC) 06/18/2023   Class 1 obesity 06/18/2023   Uncontrolled type 2 diabetes mellitus with hyperglycemia (HCC) 05/19/2023   CKD (chronic kidney disease) stage 5, GFR less than 15 ml/min (HCC) 03/09/2023   Stage 4 chronic kidney disease (HCC) 02/09/2022   Hyperlipidemia associated with type 2 diabetes mellitus (HCC) 04/20/2019   Vaginal atrophy 07/22/2018   GERD (gastroesophageal reflux disease) 01/16/2014   Carpal tunnel syndrome, bilateral    Hypertension associated with diabetes (HCC) 11/09/2006   Controlled diabetes mellitus with retinopathy (HCC) 12/07/2002    Past Medical History:  Diagnosis Date   Carpal tunnel syndrome, bilateral    confirmed on nerve conduction studies   Chronic hepatitis C (HCC)    considered cured 06/2016 post retroviral therapy    Chronic kidney disease    Diabetes mellitus type 2, uncontrolled    Diabetic peripheral neuropathy (HCC)    GERD (gastroesophageal reflux disease)    History of endometriosis    History of Helicobacter infection 10/2006   Hyperlipidemia    Liver fibrosis 02/17/2016   F2/3     Past Surgical History:  Procedure Laterality Date   ABDOMINAL HYSTERECTOMY     AV FISTULA PLACEMENT Left 10/22/2023   Procedure: LEFT ARM BRACHIOCEPHALIC ARTERIOVENOUS (AV) FISTULA CREATION;  Surgeon: [name], MD;  Location: [facility] OR;  Service: Vascular;  Laterality: Left;   CARPAL TUNNEL RELEASE      Social History   Socioeconomic History   Marital status: Single     Spouse name: Not on file   Number of children: 0   Years of education: 12   Highest education level: Not on file  Occupational History   Occupation: CNA    Employer: [employer]   Occupation: Disabled  Tobacco Use   Smoking status: Never   Smokeless tobacco: Never  Vaping Use   Vaping status: Never Used  Substance and Sexual Activity   Alcohol use: No    Alcohol/week: 0.0 standard drinks of alcohol   Drug use: No   Sexual activity: Yes    Birth control/protection: None    Comment: 1 committed partner  Other Topics Concern   Not on file  Social History Narrative   Current Social History 08/28/2019        Patient lives alone in a 7th floor apartment with elevator. There  are not steps up to the entrance the patient uses.       Patient's method of transportation is via family member (mom or friend).      The highest level of education was high school diploma.      The patient currently disabled.      Identified important Relationships are "My mother"       Pets : None       Interests / Fun: "TV, movies"       Current Stressors: "People, my health"       Religious / Personal Beliefs: "Baptist"       L. Leward Quan, RN, BSN       Social Drivers of Health   Financial Resource Strain: Low Risk  (04/24/2022)   Overall Financial Resource Strain (CARDIA)    Difficulty of Paying Living Expenses: Not hard at all  Food Insecurity: No Food Insecurity (06/22/2023)   Hunger Vital Sign    Worried About Running Out of Food in the Last Year: Never true    Ran Out of Food in the Last Year: Never true  Transportation Needs: No Transportation Needs (06/22/2023)   PRAPARE - Administrator, Civil Service (Medical): No    Lack of Transportation (Non-Medical): No  Physical Activity: Not on file  Stress: No Stress Concern Present (04/24/2022)   [employer] of Occupational Health - Occupational Stress Questionnaire    Feeling of Stress : Not at all  Social Connections:  Socially Isolated (04/24/2022)   Social Connection and Isolation Panel [NHANES]    Frequency of Communication with Friends and Family: Three times a week    Frequency of Social Gatherings with Friends and Family: Three times a week    Attends Religious Services: Never    Active Member of Clubs or Organizations: No    Attends Banker Meetings: Never    Marital Status: Divorced  Catering manager Violence: Not At Risk (04/24/2022)   Humiliation, Afraid, Rape, and Kick questionnaire    Fear of Current or Ex-Partner: No    Emotionally Abused: No    Physically Abused: No    Sexually Abused: No    Family History  Problem Relation Age of Onset   Cancer Mother        lung   Kidney disease Mother    Lung cancer Maternal Uncle    Breast cancer Maternal Grandmother    Liver cancer Maternal Grandfather    Heart disease Brother    Depression Brother    Diabetes Father    Dementia Father    Colon cancer Neg Hx      Review of Systems  Constitutional: Negative.  Negative for chills and fever.  HENT: Negative.  Negative for congestion and sore throat.   Respiratory: Negative.  Negative for cough and shortness of breath.   Cardiovascular: Negative.  Negative for chest pain and palpitations.  Gastrointestinal:  Positive for abdominal pain and constipation. Negative for blood in stool, diarrhea, melena, nausea and vomiting.  Skin: Negative.  Negative for rash.  Neurological: Negative.  Negative for dizziness and headaches.  All other systems reviewed and are negative.   Today's Vitals   11/23/23 1358  BP: 132/80  Temp: 98.6 F (37 C)  TempSrc: Oral  Weight: 150 lb (68 kg)  Height: 4\' 10"  (1.473 m)   Body mass index is 31.35 kg/m.   Physical Exam Vitals reviewed.  Constitutional:      Appearance: She is well-developed.  HENT:     Head: Normocephalic.     Mouth/Throat:     Mouth: Mucous membranes are moist.     Pharynx: Oropharynx is clear.  Eyes:     Extraocular  Movements: Extraocular movements intact.     Pupils: Pupils are equal, round, and reactive to light.  Cardiovascular:     Rate and Rhythm: Normal rate and regular rhythm.     Pulses: Normal pulses.     Heart sounds: Normal heart sounds.  Pulmonary:     Effort: Pulmonary effort is normal.     Breath sounds: Normal breath sounds.  Abdominal:     General: There is no distension.     Palpations: Abdomen is soft.     Tenderness: There is no abdominal tenderness.  Musculoskeletal:     Cervical back: No tenderness.  Lymphadenopathy:     Cervical: No cervical adenopathy.  Skin:    General: Skin is warm and dry.     Capillary Refill: Capillary refill takes less than 2 seconds.  Neurological:     General: No focal deficit present.     Mental Status: She is alert and oriented to person, place, and time.  Psychiatric:        Mood and Affect: Mood normal.        Behavior: Behavior normal.      ASSESSMENT & PLAN: A total of 44 minutes was spent with the patient and counseling/coordination of care regarding preparing for this visit, review of most recent office visit notes, review of multiple chronic medical conditions and their management, differential diagnosis of lower abdominal pain and management, review of all medications, review of most recent bloodwork results, review of health maintenance items, education on nutrition, prognosis, documentation, and need for follow up. .  Problem List Items Addressed This Visit       Cardiovascular and Mediastinum   Hypertension associated with diabetes (HCC)   Well-controlled hypertension      Lab Results  Component Value Date    HGBA1C 8.9 (A) 08/16/2023  Continues insulin treatment No hypoglycemic episodes at home Stable sugars at home.      Hypertensive heart and chronic kidney disease stage 5 (HCC)   Stable blood pressure with normal readings at home Continue diltiazem 360 mg daily Tolerating dialysis well. Feels much  improved. Clinically normovolemic. No concerns today.        Endocrine   Hyperlipidemia associated with type 2 diabetes mellitus (HCC)   Chronic stable conditions Continue atorvastatin 40 mg and Zetia 10 mg daily        Other   Lower abdominal pain - Primary   Clinically stable.  No red flag signs or symptoms Differential diagnosis discussed Benign physical examination.  Afebrile. Symptoms follow dialysis sessions.  May be related. Recommend Bentyl 20 mg every 6-8 hours as needed. Diet and nutrition discussed. ED precautions given.      Relevant Medications   dicyclomine (BENTYL) 20 MG tablet   Patient Instructions  Abdominal Pain, Adult  Many things can cause belly (abdominal) pain. In most cases, belly pain is not a serious problem and can be watched and treated at home. But in some cases, it can be serious. Your doctor will try to find the cause of your belly pain. Follow these instructions at home: Medicines Take over-the-counter and prescription medicines only as told by your doctor. Do not take medicines that help you poop (laxatives) unless told by your doctor. General instructions Watch your belly pain for  any changes. Tell your doctor if the pain gets worse. Drink enough fluid to keep your pee (urine) pale yellow. Contact a doctor if: Your belly pain changes or gets worse. You have very bad cramping or bloating in your belly. You vomit. Your pain gets worse with meals, after eating, or with certain foods. You have trouble pooping or have watery poop for more than 2-3 days. You are not hungry, or you lose weight without trying. You have signs of not getting enough fluid or water (dehydration). These may include: Dark pee, very little pee, or no pee. Cracked lips or dry mouth. Feeling sleepy or weak. You have pain when you pee or poop. Your belly pain wakes you up at night. You have blood in your pee. You have a fever. Get help right away if: You cannot  stop vomiting. Your pain is only in one part of your belly, like on the right side. You have bloody or black poop, or poop that looks like tar. You have trouble breathing. You have chest pain. These symptoms may be an emergency. Get help right away. Call 911. Do not wait to see if the symptoms will go away. Do not drive yourself to the hospital. This information is not intended to replace advice given to you by your health care provider. Make sure you discuss any questions you have with your health care provider. Document Revised: 09/09/2022 Document Reviewed: 09/09/2022 Elsevier Patient Education  2024 Elsevier Inc.    Edwina Barth, MD [facility]

## 2023-11-23 NOTE — Assessment & Plan Note (Signed)
Well-controlled hypertension Lab Results  Component Value Date   HGBA1C 8.9 (A) 08/16/2023  Continues insulin treatment No hypoglycemic episodes at home Stable sugars at home.

## 2023-11-23 NOTE — Assessment & Plan Note (Signed)
Chronic stable conditions Continue atorvastatin 40 mg and Zetia 10 mg daily

## 2023-11-23 NOTE — Assessment & Plan Note (Signed)
Stable blood pressure with normal readings at home Continue diltiazem 360 mg daily Tolerating dialysis well. Feels much improved. Clinically normovolemic. No concerns today.

## 2023-11-23 NOTE — Patient Instructions (Signed)
 Abdominal Pain, Adult    Many things can cause belly (abdominal) pain. In most cases, belly pain is not a serious problem and can be watched and treated at home. But in some cases, it can be serious.  Your doctor will try to find the cause of your belly pain.  Follow these instructions at home:  Medicines  Take over-the-counter and prescription medicines only as told by your doctor.  Do not take medicines that help you poop (laxatives) unless told by your doctor.  General instructions  Watch your belly pain for any changes. Tell your doctor if the pain gets worse.  Drink enough fluid to keep your pee (urine) pale yellow.  Contact a doctor if:  Your belly pain changes or gets worse.  You have very bad cramping or bloating in your belly.  You vomit.  Your pain gets worse with meals, after eating, or with certain foods.  You have trouble pooping or have watery poop for more than 2-3 days.  You are not hungry, or you lose weight without trying.  You have signs of not getting enough fluid or water (dehydration). These may include:  Dark pee, very little pee, or no pee.  Cracked lips or dry mouth.  Feeling sleepy or weak.  You have pain when you pee or poop.  Your belly pain wakes you up at night.  You have blood in your pee.  You have a fever.  Get help right away if:  You cannot stop vomiting.  Your pain is only in one part of your belly, like on the right side.  You have bloody or black poop, or poop that looks like tar.  You have trouble breathing.  You have chest pain.  These symptoms may be an emergency. Get help right away. Call 911.  Do not wait to see if the symptoms will go away.  Do not drive yourself to the hospital.  This information is not intended to replace advice given to you by your health care provider. Make sure you discuss any questions you have with your health care provider.  Document Revised: 09/09/2022 Document Reviewed: 09/09/2022  Elsevier Patient Education  2024 [employer].

## 2023-11-24 ENCOUNTER — Telehealth: Payer: Self-pay | Admitting: *Deleted

## 2023-11-24 DIAGNOSIS — E1122 Type 2 diabetes mellitus with diabetic chronic kidney disease: Secondary | ICD-10-CM | POA: Diagnosis not present

## 2023-11-24 DIAGNOSIS — N2581 Secondary hyperparathyroidism of renal origin: Secondary | ICD-10-CM | POA: Diagnosis not present

## 2023-11-24 DIAGNOSIS — T8249XA Other complication of vascular dialysis catheter, initial encounter: Secondary | ICD-10-CM | POA: Diagnosis not present

## 2023-11-24 DIAGNOSIS — D631 Anemia in chronic kidney disease: Secondary | ICD-10-CM | POA: Diagnosis not present

## 2023-11-24 DIAGNOSIS — N186 End stage renal disease: Secondary | ICD-10-CM | POA: Diagnosis not present

## 2023-11-24 DIAGNOSIS — Z992 Dependence on renal dialysis: Secondary | ICD-10-CM | POA: Diagnosis not present

## 2023-11-24 DIAGNOSIS — D509 Iron deficiency anemia, unspecified: Secondary | ICD-10-CM | POA: Diagnosis not present

## 2023-11-24 NOTE — Progress Notes (Unsigned)
  Care Coordination Note  11/24/2023 Name: [name] MRN: [id number] DOB: [birth year]  Nancy [name] is a 67 y.o. year old female who is a primary care patient of Georgina Quint, MD and is actively engaged with the care management team. I reached out to [employer] by phone today to assist with re-scheduling a follow up visit with the RN Case Manager  Follow up plan: Unsuccessful telephone outreach attempt made. A HIPAA compliant phone message was left for the patient providing contact information and requesting a return call.   Burman Nieves, CCMA Care Coordination Care Guide Direct Dial: [phone]

## 2023-11-25 ENCOUNTER — Other Ambulatory Visit: Payer: Self-pay

## 2023-11-25 DIAGNOSIS — N186 End stage renal disease: Secondary | ICD-10-CM

## 2023-11-26 DIAGNOSIS — Z992 Dependence on renal dialysis: Secondary | ICD-10-CM | POA: Diagnosis not present

## 2023-11-26 DIAGNOSIS — N186 End stage renal disease: Secondary | ICD-10-CM | POA: Diagnosis not present

## 2023-11-26 DIAGNOSIS — D509 Iron deficiency anemia, unspecified: Secondary | ICD-10-CM | POA: Diagnosis not present

## 2023-11-26 DIAGNOSIS — N2581 Secondary hyperparathyroidism of renal origin: Secondary | ICD-10-CM | POA: Diagnosis not present

## 2023-11-26 DIAGNOSIS — T8249XA Other complication of vascular dialysis catheter, initial encounter: Secondary | ICD-10-CM | POA: Diagnosis not present

## 2023-11-26 DIAGNOSIS — E1122 Type 2 diabetes mellitus with diabetic chronic kidney disease: Secondary | ICD-10-CM | POA: Diagnosis not present

## 2023-11-26 DIAGNOSIS — D631 Anemia in chronic kidney disease: Secondary | ICD-10-CM | POA: Diagnosis not present

## 2023-11-28 DIAGNOSIS — D509 Iron deficiency anemia, unspecified: Secondary | ICD-10-CM | POA: Diagnosis not present

## 2023-11-28 DIAGNOSIS — E1122 Type 2 diabetes mellitus with diabetic chronic kidney disease: Secondary | ICD-10-CM | POA: Diagnosis not present

## 2023-11-28 DIAGNOSIS — D631 Anemia in chronic kidney disease: Secondary | ICD-10-CM | POA: Diagnosis not present

## 2023-11-28 DIAGNOSIS — Z992 Dependence on renal dialysis: Secondary | ICD-10-CM | POA: Diagnosis not present

## 2023-11-28 DIAGNOSIS — T8249XA Other complication of vascular dialysis catheter, initial encounter: Secondary | ICD-10-CM | POA: Diagnosis not present

## 2023-11-28 DIAGNOSIS — N186 End stage renal disease: Secondary | ICD-10-CM | POA: Diagnosis not present

## 2023-11-28 DIAGNOSIS — N2581 Secondary hyperparathyroidism of renal origin: Secondary | ICD-10-CM | POA: Diagnosis not present

## 2023-12-02 ENCOUNTER — Ambulatory Visit
Admission: RE | Admit: 2023-12-02 | Discharge: 2023-12-02 | Disposition: A | Discharge status: Home / Self Care | Payer: 59 | Source: Ambulatory Visit | Attending: Emergency Medicine | Admitting: Emergency Medicine

## 2023-12-02 DIAGNOSIS — Z1231 Encounter for screening mammogram for malignant neoplasm of breast: Secondary | ICD-10-CM

## 2023-12-03 DIAGNOSIS — N186 End stage renal disease: Secondary | ICD-10-CM | POA: Diagnosis not present

## 2023-12-03 DIAGNOSIS — E1122 Type 2 diabetes mellitus with diabetic chronic kidney disease: Secondary | ICD-10-CM | POA: Diagnosis not present

## 2023-12-03 DIAGNOSIS — D631 Anemia in chronic kidney disease: Secondary | ICD-10-CM | POA: Diagnosis not present

## 2023-12-03 DIAGNOSIS — T8249XA Other complication of vascular dialysis catheter, initial encounter: Secondary | ICD-10-CM | POA: Diagnosis not present

## 2023-12-03 DIAGNOSIS — D509 Iron deficiency anemia, unspecified: Secondary | ICD-10-CM | POA: Diagnosis not present

## 2023-12-03 DIAGNOSIS — Z992 Dependence on renal dialysis: Secondary | ICD-10-CM | POA: Diagnosis not present

## 2023-12-03 DIAGNOSIS — N2581 Secondary hyperparathyroidism of renal origin: Secondary | ICD-10-CM | POA: Diagnosis not present

## 2023-12-05 DIAGNOSIS — N2581 Secondary hyperparathyroidism of renal origin: Secondary | ICD-10-CM | POA: Diagnosis not present

## 2023-12-05 DIAGNOSIS — Z992 Dependence on renal dialysis: Secondary | ICD-10-CM | POA: Diagnosis not present

## 2023-12-05 DIAGNOSIS — D631 Anemia in chronic kidney disease: Secondary | ICD-10-CM | POA: Diagnosis not present

## 2023-12-05 DIAGNOSIS — E1122 Type 2 diabetes mellitus with diabetic chronic kidney disease: Secondary | ICD-10-CM | POA: Diagnosis not present

## 2023-12-05 DIAGNOSIS — T8249XA Other complication of vascular dialysis catheter, initial encounter: Secondary | ICD-10-CM | POA: Diagnosis not present

## 2023-12-05 DIAGNOSIS — D509 Iron deficiency anemia, unspecified: Secondary | ICD-10-CM | POA: Diagnosis not present

## 2023-12-05 DIAGNOSIS — N186 End stage renal disease: Secondary | ICD-10-CM | POA: Diagnosis not present

## 2023-12-07 DIAGNOSIS — N186 End stage renal disease: Secondary | ICD-10-CM | POA: Diagnosis not present

## 2023-12-07 DIAGNOSIS — D631 Anemia in chronic kidney disease: Secondary | ICD-10-CM | POA: Diagnosis not present

## 2023-12-07 DIAGNOSIS — D509 Iron deficiency anemia, unspecified: Secondary | ICD-10-CM | POA: Diagnosis not present

## 2023-12-07 DIAGNOSIS — T8249XA Other complication of vascular dialysis catheter, initial encounter: Secondary | ICD-10-CM | POA: Diagnosis not present

## 2023-12-07 DIAGNOSIS — E1122 Type 2 diabetes mellitus with diabetic chronic kidney disease: Secondary | ICD-10-CM | POA: Diagnosis not present

## 2023-12-07 DIAGNOSIS — N2581 Secondary hyperparathyroidism of renal origin: Secondary | ICD-10-CM | POA: Diagnosis not present

## 2023-12-07 DIAGNOSIS — Z992 Dependence on renal dialysis: Secondary | ICD-10-CM | POA: Diagnosis not present

## 2023-12-10 DIAGNOSIS — Z23 Encounter for immunization: Secondary | ICD-10-CM | POA: Diagnosis not present

## 2023-12-10 DIAGNOSIS — N2581 Secondary hyperparathyroidism of renal origin: Secondary | ICD-10-CM | POA: Diagnosis not present

## 2023-12-10 DIAGNOSIS — Z992 Dependence on renal dialysis: Secondary | ICD-10-CM | POA: Diagnosis not present

## 2023-12-10 DIAGNOSIS — N186 End stage renal disease: Secondary | ICD-10-CM | POA: Diagnosis not present

## 2023-12-10 DIAGNOSIS — E1122 Type 2 diabetes mellitus with diabetic chronic kidney disease: Secondary | ICD-10-CM | POA: Diagnosis not present

## 2023-12-10 DIAGNOSIS — D631 Anemia in chronic kidney disease: Secondary | ICD-10-CM | POA: Diagnosis not present

## 2023-12-10 DIAGNOSIS — D509 Iron deficiency anemia, unspecified: Secondary | ICD-10-CM | POA: Diagnosis not present

## 2023-12-10 DIAGNOSIS — T8249XA Other complication of vascular dialysis catheter, initial encounter: Secondary | ICD-10-CM | POA: Diagnosis not present

## 2023-12-13 DIAGNOSIS — D631 Anemia in chronic kidney disease: Secondary | ICD-10-CM | POA: Diagnosis not present

## 2023-12-13 DIAGNOSIS — Z992 Dependence on renal dialysis: Secondary | ICD-10-CM | POA: Diagnosis not present

## 2023-12-13 DIAGNOSIS — E1122 Type 2 diabetes mellitus with diabetic chronic kidney disease: Secondary | ICD-10-CM | POA: Diagnosis not present

## 2023-12-13 DIAGNOSIS — N2581 Secondary hyperparathyroidism of renal origin: Secondary | ICD-10-CM | POA: Diagnosis not present

## 2023-12-13 DIAGNOSIS — D509 Iron deficiency anemia, unspecified: Secondary | ICD-10-CM | POA: Diagnosis not present

## 2023-12-13 DIAGNOSIS — T8249XA Other complication of vascular dialysis catheter, initial encounter: Secondary | ICD-10-CM | POA: Diagnosis not present

## 2023-12-13 DIAGNOSIS — Z23 Encounter for immunization: Secondary | ICD-10-CM | POA: Diagnosis not present

## 2023-12-13 DIAGNOSIS — N186 End stage renal disease: Secondary | ICD-10-CM | POA: Diagnosis not present

## 2023-12-15 DIAGNOSIS — Z992 Dependence on renal dialysis: Secondary | ICD-10-CM | POA: Diagnosis not present

## 2023-12-15 DIAGNOSIS — D631 Anemia in chronic kidney disease: Secondary | ICD-10-CM | POA: Diagnosis not present

## 2023-12-15 DIAGNOSIS — E1122 Type 2 diabetes mellitus with diabetic chronic kidney disease: Secondary | ICD-10-CM | POA: Diagnosis not present

## 2023-12-15 DIAGNOSIS — Z23 Encounter for immunization: Secondary | ICD-10-CM | POA: Diagnosis not present

## 2023-12-15 DIAGNOSIS — D509 Iron deficiency anemia, unspecified: Secondary | ICD-10-CM | POA: Diagnosis not present

## 2023-12-15 DIAGNOSIS — N186 End stage renal disease: Secondary | ICD-10-CM | POA: Diagnosis not present

## 2023-12-15 DIAGNOSIS — T8249XA Other complication of vascular dialysis catheter, initial encounter: Secondary | ICD-10-CM | POA: Diagnosis not present

## 2023-12-15 DIAGNOSIS — N2581 Secondary hyperparathyroidism of renal origin: Secondary | ICD-10-CM | POA: Diagnosis not present

## 2023-12-16 ENCOUNTER — Telehealth: Payer: Self-pay | Admitting: Emergency Medicine

## 2023-12-16 ENCOUNTER — Other Ambulatory Visit: Payer: Self-pay | Admitting: Radiology

## 2023-12-16 ENCOUNTER — Other Ambulatory Visit: Payer: Self-pay | Admitting: Emergency Medicine

## 2023-12-16 DIAGNOSIS — E1159 Type 2 diabetes mellitus with other circulatory complications: Secondary | ICD-10-CM

## 2023-12-16 MED ORDER — ATORVASTATIN CALCIUM 40 MG PO TABS: 40.0000 mg | ORAL_TABLET | Freq: Every day | ORAL | 3 refills | Status: DC

## 2023-12-16 NOTE — Telephone Encounter (Signed)
 Copied from CRM [id number]. Topic: Clinical - Medication Refill >> Dec 16, 2023 10:02 AM Corin V wrote: Most Recent Primary Care Visit:  Provider: PURCELL EMIL SCHANZ  Department: [facility]  Visit Type: OFFICE VISIT  Date: 11/23/2023  Medication: atorvastatin  (LIPITOR) 40 MG tablet DILT-XR 240 MG 24 hr capsule    furosemide  (LASIX ) 40 MG tablet     Has the patient contacted their pharmacy? Yes (Agent: If no, request that the patient contact the pharmacy for the refill. If patient does not wish to contact the pharmacy document the reason why and proceed with request.) (Agent: If yes, when and what did the pharmacy advise?)  Is this the correct pharmacy for this prescription? Yes If no, delete pharmacy and type the correct one.  This is the patient's preferred pharmacy:  [id number] [place], [place] - [address] AT [address] [place] [address] Phone: [phone] Fax: [phone]   Has the prescription been filled recently? No  Is the patient out of the medication? No  Has the patient been seen for an appointment in the last year OR does the patient have an upcoming appointment? Yes  Can we respond through MyChart? No  Agent: Please be advised that Rx refills may take up to 3 business days. We ask that you follow-up with your pharmacy.

## 2023-12-16 NOTE — Telephone Encounter (Signed)
 Please look into this

## 2023-12-16 NOTE — Telephone Encounter (Unsigned)
 Copied from CRM [id number]. Topic: Clinical - Medication Refill >> Dec 16, 2023 10:02 AM Corin V wrote: Most Recent Primary Care Visit:  Provider: PURCELL EMIL SCHANZ  Department: [facility]  Visit Type: OFFICE VISIT  Date: 11/23/2023  Medication: atorvastatin  (LIPITOR) 40 MG tablet   Has the patient contacted their pharmacy? {yes/no:20286} (Agent: If no, request that the patient contact the pharmacy for the refill. If patient does not wish to contact the pharmacy document the reason why and proceed with request.) (Agent: If yes, when and what did the pharmacy advise?)  Is this the correct pharmacy for this prescription? {yes/no:20286} If no, delete pharmacy and type the correct one.  This is the patient's preferred pharmacy:  [id number] [place], [place] - [address] [facility] [facility] [address] [place] [address] Phone: [phone] Fax: [phone]   Has the prescription been filled recently? {yes/no:20286}  Is the patient out of the medication? {yes/no:20286}  Has the patient been seen for an appointment in the last year OR does the patient have an upcoming appointment? {yes/no:20286}  Can we respond through MyChart? {yes/no:20286}  Agent: Please be advised that Rx refills may take up to 3 business days. We ask that you follow-up with your pharmacy.

## 2023-12-17 DIAGNOSIS — T8249XA Other complication of vascular dialysis catheter, initial encounter: Secondary | ICD-10-CM | POA: Diagnosis not present

## 2023-12-17 DIAGNOSIS — E1122 Type 2 diabetes mellitus with diabetic chronic kidney disease: Secondary | ICD-10-CM | POA: Diagnosis not present

## 2023-12-17 DIAGNOSIS — Z23 Encounter for immunization: Secondary | ICD-10-CM | POA: Diagnosis not present

## 2023-12-17 DIAGNOSIS — N2581 Secondary hyperparathyroidism of renal origin: Secondary | ICD-10-CM | POA: Diagnosis not present

## 2023-12-17 DIAGNOSIS — D509 Iron deficiency anemia, unspecified: Secondary | ICD-10-CM | POA: Diagnosis not present

## 2023-12-17 DIAGNOSIS — D631 Anemia in chronic kidney disease: Secondary | ICD-10-CM | POA: Diagnosis not present

## 2023-12-17 DIAGNOSIS — Z992 Dependence on renal dialysis: Secondary | ICD-10-CM | POA: Diagnosis not present

## 2023-12-17 DIAGNOSIS — N186 End stage renal disease: Secondary | ICD-10-CM | POA: Diagnosis not present

## 2023-12-17 NOTE — Progress Notes (Signed)
 Complex Care Management Care Guide Note  12/17/2023 Name: Nancy [name] MRN: [id number] DOB: 20-Sep-1956  Nancy [name] is a 68 y.o. year old female who is a primary care patient of [name], Miguel Jose, MD and is actively engaged with the care management team. I reached out to [employer] by phone today to assist with re-scheduling  with the RN Case Manager.  Follow up plan: Telephone appointment with complex care management team member scheduled for:  12/23/2023  Thedford Franks, [facility] Care Coordination Care Guide Direct Dial : [phone]

## 2023-12-20 DIAGNOSIS — D631 Anemia in chronic kidney disease: Secondary | ICD-10-CM | POA: Diagnosis not present

## 2023-12-20 DIAGNOSIS — D509 Iron deficiency anemia, unspecified: Secondary | ICD-10-CM | POA: Diagnosis not present

## 2023-12-20 DIAGNOSIS — T8249XA Other complication of vascular dialysis catheter, initial encounter: Secondary | ICD-10-CM | POA: Diagnosis not present

## 2023-12-20 DIAGNOSIS — N2581 Secondary hyperparathyroidism of renal origin: Secondary | ICD-10-CM | POA: Diagnosis not present

## 2023-12-20 DIAGNOSIS — E1122 Type 2 diabetes mellitus with diabetic chronic kidney disease: Secondary | ICD-10-CM | POA: Diagnosis not present

## 2023-12-20 DIAGNOSIS — Z23 Encounter for immunization: Secondary | ICD-10-CM | POA: Diagnosis not present

## 2023-12-20 DIAGNOSIS — N186 End stage renal disease: Secondary | ICD-10-CM | POA: Diagnosis not present

## 2023-12-20 DIAGNOSIS — Z992 Dependence on renal dialysis: Secondary | ICD-10-CM | POA: Diagnosis not present

## 2023-12-22 DIAGNOSIS — D509 Iron deficiency anemia, unspecified: Secondary | ICD-10-CM | POA: Diagnosis not present

## 2023-12-22 DIAGNOSIS — N2581 Secondary hyperparathyroidism of renal origin: Secondary | ICD-10-CM | POA: Diagnosis not present

## 2023-12-22 DIAGNOSIS — Z23 Encounter for immunization: Secondary | ICD-10-CM | POA: Diagnosis not present

## 2023-12-22 DIAGNOSIS — E1122 Type 2 diabetes mellitus with diabetic chronic kidney disease: Secondary | ICD-10-CM | POA: Diagnosis not present

## 2023-12-22 DIAGNOSIS — T8249XA Other complication of vascular dialysis catheter, initial encounter: Secondary | ICD-10-CM | POA: Diagnosis not present

## 2023-12-22 DIAGNOSIS — D631 Anemia in chronic kidney disease: Secondary | ICD-10-CM | POA: Diagnosis not present

## 2023-12-22 DIAGNOSIS — Z992 Dependence on renal dialysis: Secondary | ICD-10-CM | POA: Diagnosis not present

## 2023-12-22 DIAGNOSIS — N186 End stage renal disease: Secondary | ICD-10-CM | POA: Diagnosis not present

## 2023-12-23 ENCOUNTER — Telehealth: Payer: Self-pay

## 2023-12-23 NOTE — Patient Outreach (Signed)
  Care Coordination   12/23/2023 Name: Nancy [name] MRN: [id number] DOB: 12-31-1955   Care Coordination Outreach Attempts:  An unsuccessful outreach was attempted for an appointment today.  Follow Up Plan:  Additional outreach attempts will be made to offer the patient complex care management information and services.   Encounter Outcome:  No Answer   Care Coordination Interventions:  No, not indicated    [name], RN, MSN, BSN, CCM [facility]BC[facility] RN Care Manager [phone]

## 2023-12-24 DIAGNOSIS — D631 Anemia in chronic kidney disease: Secondary | ICD-10-CM | POA: Diagnosis not present

## 2023-12-24 DIAGNOSIS — T8249XA Other complication of vascular dialysis catheter, initial encounter: Secondary | ICD-10-CM | POA: Diagnosis not present

## 2023-12-24 DIAGNOSIS — E1122 Type 2 diabetes mellitus with diabetic chronic kidney disease: Secondary | ICD-10-CM | POA: Diagnosis not present

## 2023-12-24 DIAGNOSIS — Z992 Dependence on renal dialysis: Secondary | ICD-10-CM | POA: Diagnosis not present

## 2023-12-24 DIAGNOSIS — Z23 Encounter for immunization: Secondary | ICD-10-CM | POA: Diagnosis not present

## 2023-12-24 DIAGNOSIS — N186 End stage renal disease: Secondary | ICD-10-CM | POA: Diagnosis not present

## 2023-12-24 DIAGNOSIS — D509 Iron deficiency anemia, unspecified: Secondary | ICD-10-CM | POA: Diagnosis not present

## 2023-12-24 DIAGNOSIS — N2581 Secondary hyperparathyroidism of renal origin: Secondary | ICD-10-CM | POA: Diagnosis not present

## 2023-12-27 DIAGNOSIS — D631 Anemia in chronic kidney disease: Secondary | ICD-10-CM | POA: Diagnosis not present

## 2023-12-27 DIAGNOSIS — D509 Iron deficiency anemia, unspecified: Secondary | ICD-10-CM | POA: Diagnosis not present

## 2023-12-27 DIAGNOSIS — Z23 Encounter for immunization: Secondary | ICD-10-CM | POA: Diagnosis not present

## 2023-12-27 DIAGNOSIS — Z992 Dependence on renal dialysis: Secondary | ICD-10-CM | POA: Diagnosis not present

## 2023-12-27 DIAGNOSIS — N186 End stage renal disease: Secondary | ICD-10-CM | POA: Diagnosis not present

## 2023-12-27 DIAGNOSIS — T8249XA Other complication of vascular dialysis catheter, initial encounter: Secondary | ICD-10-CM | POA: Diagnosis not present

## 2023-12-27 DIAGNOSIS — E1122 Type 2 diabetes mellitus with diabetic chronic kidney disease: Secondary | ICD-10-CM | POA: Diagnosis not present

## 2023-12-27 DIAGNOSIS — N2581 Secondary hyperparathyroidism of renal origin: Secondary | ICD-10-CM | POA: Diagnosis not present

## 2023-12-31 DIAGNOSIS — N2581 Secondary hyperparathyroidism of renal origin: Secondary | ICD-10-CM | POA: Diagnosis not present

## 2023-12-31 DIAGNOSIS — E1122 Type 2 diabetes mellitus with diabetic chronic kidney disease: Secondary | ICD-10-CM | POA: Diagnosis not present

## 2023-12-31 DIAGNOSIS — Z992 Dependence on renal dialysis: Secondary | ICD-10-CM | POA: Diagnosis not present

## 2023-12-31 DIAGNOSIS — N186 End stage renal disease: Secondary | ICD-10-CM | POA: Diagnosis not present

## 2023-12-31 DIAGNOSIS — D509 Iron deficiency anemia, unspecified: Secondary | ICD-10-CM | POA: Diagnosis not present

## 2023-12-31 DIAGNOSIS — Z23 Encounter for immunization: Secondary | ICD-10-CM | POA: Diagnosis not present

## 2023-12-31 DIAGNOSIS — T8249XA Other complication of vascular dialysis catheter, initial encounter: Secondary | ICD-10-CM | POA: Diagnosis not present

## 2023-12-31 DIAGNOSIS — D631 Anemia in chronic kidney disease: Secondary | ICD-10-CM | POA: Diagnosis not present

## 2024-01-03 DIAGNOSIS — N2581 Secondary hyperparathyroidism of renal origin: Secondary | ICD-10-CM | POA: Diagnosis not present

## 2024-01-03 DIAGNOSIS — N186 End stage renal disease: Secondary | ICD-10-CM | POA: Diagnosis not present

## 2024-01-03 DIAGNOSIS — E1122 Type 2 diabetes mellitus with diabetic chronic kidney disease: Secondary | ICD-10-CM | POA: Diagnosis not present

## 2024-01-03 DIAGNOSIS — D509 Iron deficiency anemia, unspecified: Secondary | ICD-10-CM | POA: Diagnosis not present

## 2024-01-03 DIAGNOSIS — D631 Anemia in chronic kidney disease: Secondary | ICD-10-CM | POA: Diagnosis not present

## 2024-01-03 DIAGNOSIS — T8249XA Other complication of vascular dialysis catheter, initial encounter: Secondary | ICD-10-CM | POA: Diagnosis not present

## 2024-01-03 DIAGNOSIS — Z23 Encounter for immunization: Secondary | ICD-10-CM | POA: Diagnosis not present

## 2024-01-03 DIAGNOSIS — Z992 Dependence on renal dialysis: Secondary | ICD-10-CM | POA: Diagnosis not present

## 2024-01-04 ENCOUNTER — Other Ambulatory Visit: Payer: Self-pay

## 2024-01-04 ENCOUNTER — Ambulatory Visit (INDEPENDENT_AMBULATORY_CARE_PROVIDER_SITE_OTHER): Payer: 59 | Admitting: Physician Assistant

## 2024-01-04 ENCOUNTER — Ambulatory Visit (HOSPITAL_COMMUNITY)
Admission: RE | Admit: 2024-01-04 | Discharge: 2024-01-04 | Disposition: A | Discharge status: Home / Self Care | Payer: 59 | Source: Ambulatory Visit | Attending: Vascular Surgery | Admitting: Vascular Surgery

## 2024-01-04 VITALS — BP 150/76 | HR 54 | Temp 98.0°F | Resp 18 | Ht <= 58 in | Wt 144.7 lb

## 2024-01-04 DIAGNOSIS — Z992 Dependence on renal dialysis: Secondary | ICD-10-CM | POA: Insufficient documentation

## 2024-01-04 DIAGNOSIS — N186 End stage renal disease: Secondary | ICD-10-CM

## 2024-01-04 NOTE — Progress Notes (Signed)
POST OPERATIVE OFFICE NOTE    CC:  F/u for surgery  HPI:  This is a 68 y.o. female who is s/p left BC AVF on 10/22/2023 by Dr. Chestine Spore.   She was seen 11/23/2023 and at that time, she had a palpable thrill in the fistula but duplex demonstrated low flow velocities and she was instructed to work with stress ball and come back for follow up duplex.   Pt states she does not have pain/numbness in the left hand.    The pt is on dialysis M/W/F at [address] location.   Allergies  Allergen Reactions   Ace Inhibitors Other (See Comments) and Cough    Persistent dry cough   Amitriptyline Hcl Nausea And Vomiting and Other (See Comments)    GI Intolerance/Upset stomach   Aspirin Other (See Comments)    GI Intolerance   Latex Itching   Prednisone Nausea And Vomiting    Current Outpatient Medications  Medication Sig Dispense Refill   ACCU-CHEK FASTCLIX LANCETS MISC Use to check blood sugar up to 3 times a day 306 each 3   ACCU-CHEK GUIDE test strip CHECK BLOOD SUGAR 3 TIMES A DAY 300 strip 3   atorvastatin (LIPITOR) 40 MG tablet Take 1 tablet (40 mg total) by mouth daily. 90 tablet 3   Blood Glucose Monitoring Suppl (ACCU-CHEK GUIDE) w/Device KIT 1 each by Does not apply route 3 (three) times daily. Check blood sugar 3 times a day 1 kit 0   Blood Pressure Monitoring (ADVANCED ONE STEP BP MONITOR) MISC 1 Units by Does not apply route daily. 1 each 0   Cholecalciferol (VITAMIN D3) 25 MCG (1000 UT) CAPS Take 1 capsule by mouth daily.     Continuous Glucose Receiver (DEXCOM G7 RECEIVER) DEVI To check blood sugars twice a day 1 each 3   Continuous Glucose Sensor (DEXCOM G7 SENSOR) MISC Use to check blood sugars twice a day 3 each 3   cyclobenzaprine (FLEXERIL) 10 MG tablet TAKE 1 TABLET BY MOUTH EVERYDAY AT BEDTIME 30 tablet 1   diclofenac Sodium (VOLTAREN) 1 % GEL Apply 2 g topically 4 (four) times daily. 150 g 0   dicyclomine (BENTYL) 20 MG tablet Take 1 tablet (20 mg total) by mouth every  6 (six) hours as needed for spasms. 30 tablet 1   diltiazem (CARDIZEM CD) 360 MG 24 hr capsule Take 1 capsule (360 mg total) by mouth daily. 90 capsule 3   ezetimibe (ZETIA) 10 MG tablet Take 1 tablet (10 mg total) by mouth daily. 90 tablet 3   Glucagon (GVOKE HYPOPEN 2-PACK) 0.5 MG/0.1ML SOAJ Inject 0.5 mg into the skin as needed. 0.2 mL 3   insulin glargine (LANTUS) 100 UNIT/ML injection Inject 0.1 mLs (10 Units total) into the skin daily. 3 mL 0   insulin lispro (HUMALOG) 100 UNIT/ML KwikPen For sugar 120 to 150 use one unit, for 151 to 200 use two units, for 201 to 250 use three units, for 251 to 300 use five units, for 301 to 350 use seven units, for 351 to 400 use 9 units, if sugar more than 400 call your primary care provider. 15 mL 0   Insulin Pen Needle (PEN NEEDLES) 31G X 5 MM MISC 1 each by Does not apply route 3 (three) times daily with meals. May substitute to any manufacturer covered by patient's insurance. 100 each 0   Insulin Syringe-Needle U-100 31G X 15/64" 0.5 ML MISC Use to inject insulin one time a day 100  each 0   Insulin Syringes, Disposable, U-100 1 ML MISC 2 Syringes by Does not apply route daily. Use to inject insulin into the skin 1 time daily. diag code E11.9. Insulin dependent 100 each 0   omeprazole (PRILOSEC) 40 MG capsule TAKE 1 CAPSULE BY MOUTH EVERY DAY 90 capsule 3   oxyCODONE (ROXICODONE) 5 MG immediate release tablet Take 1 tablet (5 mg total) by mouth every 4 (four) hours as needed for severe pain (pain score 7-10). 10 tablet 0   senna (SENOKOT) 8.6 MG tablet Take 1 tablet (8.6 mg total) by mouth as needed for constipation. 60 tablet 3   No current facility-administered medications for this visit.     ROS:  See HPI  Physical Exam:  Today's Vitals   01/04/24 1207  BP: (!) 150/76  Pulse: (!) 54  Resp: 18  Temp: 98 F (36.7 C)  TempSrc: Temporal  SpO2: 90%  Weight: 144 lb 11.2 oz (65.6 kg)  Height: 4\' 10"  (1.473 m)  PainSc: 0-No pain   Body mass  index is 30.24 kg/m.   Incision:  well healed Extremities:   There is a palpable left radial pulse.   Motor and sensory are in tact.   There is a thrill/bruit present.  Access is  easily palpable at the antecubital space and the upper arm.  It is palpable in the mid upper arm but more difficult to feel.    Dialysis Duplex on 01/04/2024: Findings:  +--------------------+----------+-----------------+--------+  AVF                PSV (cm/s)Flow Vol (mL/min)Comments  +--------------------+----------+-----------------+--------+  Native artery inflow   199           743                 +--------------------+----------+-----------------+--------+  AVF Anastomosis        247                               +--------------------+----------+-----------------+--------+   +------------+----------+-------------+----------+---------------+  OUTFLOW VEINPSV (cm/s)Diameter (cm)Depth (cm)   Describe      +------------+----------+-------------+----------+---------------+  Shoulder      263        0.69        1.10                    +------------+----------+-------------+----------+---------------+  Prox UA        245        0.58        1.06   mild tortuosity  +------------+----------+-------------+----------+---------------+  Mid UA         105        0.70        0.59                    +------------+----------+-------------+----------+---------------+  Dist UA        209        0.85        0.24                    +------------+----------+-------------+----------+---------------+  AC Fossa       553        1.32        0.20                    +------------+----------+-------------+----------+---------------+    Assessment/Plan:  This is a 68 y.o.  female who is s/p:  left BC AVF on 10/22/2023 by Dr. Chestine Spore.    -the pt does not have evidence of steal. -duplex shows the fistula is maturing nicely.  It is easily palpable at the antecubital space and  upper arm, however it os more difficult to palpate in the mid arm and will need superficialization.  We will schedule this on a non dialysis day with Dr. Chestine Spore.   -if pt has tunneled catheter, this can be removed at the discretion of the dialysis center once the pt's access has been successfully cannulated to their satisfaction.  -discussed with pt that access does not last forever and will need intervention or even new access at some point.  She expressed understanding.    Doreatha Massed, [facility] [phone]  Clinic MD:  pt seen with Dr. Chestine Spore

## 2024-01-05 ENCOUNTER — Ambulatory Visit: Payer: 59 | Admitting: Emergency Medicine

## 2024-01-05 DIAGNOSIS — E1122 Type 2 diabetes mellitus with diabetic chronic kidney disease: Secondary | ICD-10-CM | POA: Diagnosis not present

## 2024-01-05 DIAGNOSIS — N186 End stage renal disease: Secondary | ICD-10-CM | POA: Diagnosis not present

## 2024-01-05 DIAGNOSIS — D509 Iron deficiency anemia, unspecified: Secondary | ICD-10-CM | POA: Diagnosis not present

## 2024-01-05 DIAGNOSIS — D631 Anemia in chronic kidney disease: Secondary | ICD-10-CM | POA: Diagnosis not present

## 2024-01-05 DIAGNOSIS — N2581 Secondary hyperparathyroidism of renal origin: Secondary | ICD-10-CM | POA: Diagnosis not present

## 2024-01-05 DIAGNOSIS — Z992 Dependence on renal dialysis: Secondary | ICD-10-CM | POA: Diagnosis not present

## 2024-01-05 DIAGNOSIS — T8249XA Other complication of vascular dialysis catheter, initial encounter: Secondary | ICD-10-CM | POA: Diagnosis not present

## 2024-01-05 DIAGNOSIS — Z23 Encounter for immunization: Secondary | ICD-10-CM | POA: Diagnosis not present

## 2024-01-06 ENCOUNTER — Other Ambulatory Visit: Payer: Self-pay

## 2024-01-06 ENCOUNTER — Encounter (HOSPITAL_COMMUNITY): Payer: Self-pay | Admitting: Vascular Surgery

## 2024-01-06 ENCOUNTER — Ambulatory Visit: Payer: 59 | Admitting: Emergency Medicine

## 2024-01-06 ENCOUNTER — Ambulatory Visit: Payer: Self-pay

## 2024-01-06 NOTE — Progress Notes (Signed)
PCP - Dr [name] [name] Cardiologist - Dr Riley Lam  Chest x-ray - 06/17/23 EKG - 06/17/23 Stress Test - 2007 (Neg) ECHO - 04/19/23 Cardiac Cath - n/a  ICD Pacemaker/Loop - n/a  Sleep Study -  Yes (09/2018) CPAP - Very mild OSA - no CPAP  Diabetes Type 2 THE MORNING OF SURGERY, take 11 Units of Lantus Insulin.    Humalog Insulin on a sliding scale.  If needed on DOS 1/2 dose (0.5-4.5 Units).  If your blood sugar is less than 70 mg/dL, you will need to treat for low blood sugar: Treat a low blood sugar (less than 70 mg/dL) with  cup of clear juice (cranberry or apple), 4 glucose tablets, OR glucose gel. Recheck blood sugar in 15 minutes after treatment (to make sure it is greater than 70 mg/dL). If your blood sugar is not greater than 70 mg/dL on recheck, call [phone] for further instructions.  Aspirin and Blood Thinner Instructions:  n/a  NPO   Anesthesia review: Yes  STOP now taking any Aspirin (unless otherwise instructed by your surgeon), Aleve, Naproxen, Ibuprofen, Motrin, Advil, Goody's, BC's, all herbal medications, fish oil, and all vitamins.   Coronavirus Screening Do you have any of the following symptoms:  Cough yes/no: No Fever (>100.30F)  yes/no: No Runny nose yes/no: No Sore throat yes/no: No Difficulty breathing/shortness of breath  yes/no: No  Have you traveled in the last 14 days and where? yes/no: No  Patient verbalized understanding of instructions that were given via phone.

## 2024-01-06 NOTE — Patient Outreach (Signed)
Care Coordination   Follow Up Visit Note   01/06/2024 Name: Nancy [name] MRN: [id number] DOB: [birth year]  Nancy [name] is a 68 y.o. year old female who sees [name], Nancy Kempf, MD for primary care. I spoke with  [name] by phone today.  What matters to the patients health and wellness today?  RNCM called to follow up. 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 [name] reports she is receiving HD on Monday, Wednesday and Friday. She reports she is scheduled for procedure on her fistula on 01/09/23. She states she is interested in getting on the kidney transplant list. She also reports her previous pharmacy closed and she is interested in changing pharmacy from where she is getting her medications now.   Goals Addressed               This Visit's Progress     COMPLETED: Assist with health management        Interventions Today    Flowsheet Row Most Recent Value  Chronic Disease   Chronic disease during today's visit Hypertension (HTN), Chronic Kidney Disease/End Stage Renal Disease (ESRD), Diabetes  General Interventions   General Interventions Discussed/Reviewed General Interventions Reviewed, Doctor Visits  [Evaluation of current treatment plan for health condition and patient's adherence to plan.]  Doctor Visits Discussed/Reviewed Doctor Visits Discussed, PCP  PCP/Specialist Visits Compliance with follow-up visit  [discussed upcoming/scheduled appointments]            COMPLETED: Care Coordination Activities (pt-stated)        Care Coordination Interventions: Reviewed scheduled/upcoming provider appointments including . Discussed plans with patient for ongoing care management follow up and provided patient with direct contact information for care management team Assessed social determinant of health barriers  Patient declined needing any assistance at this time.      Care Coordination Activities        Interventions  Today    Flowsheet Row Most Recent Value  Chronic Disease   Chronic disease during today's visit Chronic Kidney Disease/End Stage Renal Disease (ESRD)  General Interventions   General Interventions Discussed/Reviewed General Interventions Reviewed, Doctor Visits  [Evaluation of current treatment plan for health condition and patient's adherence to plan.]  Doctor Visits Discussed/Reviewed Doctor Visits Discussed, PCP  PCP/Specialist Visits Compliance with follow-up visit  [reviewed scheduled appointments: appointment with PCP scheduled on 01/13/24. Patient states unable to make that appointment and will call to reschedule that appointment.]  Education Interventions   Education Provided Provided Education  Provided Verbal Education On Medication, When to see the doctor, Other  [advised patient to discuss with social worker at HD clinic about process for getting on transplant list. adviised to make sure she follows up with provider and reschedule the visit that she is unable to attend.]  Pharmacy Interventions   Pharmacy Dicussed/Reviewed Pharmacy Topics Discussed, Referral to Pharmacist  Referral to Pharmacist --  [Patient reports unable to get medications from pharmacy and would like assistance from clinical pharmacist.]            SDOH assessments and interventions completed:  Yes  SDOH Interventions Today    Flowsheet Row Most Recent Value  SDOH Interventions   Food Insecurity Interventions Intervention Not Indicated  Housing Interventions Intervention Not Indicated  Transportation Interventions Intervention Not Indicated  Utilities Interventions Intervention Not Indicated     Care Coordination Interventions:  Yes, provided   Follow up plan: Follow up call scheduled for 01/11/24  Encounter Outcome:  Patient Visit Completed   [name], RN, MSN, BSN, CCM [place]  [facility], Population Health Case Manager Phone: [phone]

## 2024-01-06 NOTE — Patient Instructions (Signed)
Visit Information  Thank you for taking time to visit with me today. Please don't hesitate to contact me if I can be of assistance to you.   Following are the goals we discussed today:  Continue to take medications as prescribed. Continue to attend provider visits as scheduled Contact provider with health questions or concerns as needed Discuss kidney transplant process with your kidney specialist/social worker  Our next appointment is by telephone on 01/11/24 at 11:30 am  Please call the care guide team at [phone] if you need to cancel or reschedule your appointment.   If you are experiencing a Mental Health or Behavioral Health Crisis or need someone to talk to, please call the Suicide and Crisis Lifeline: 988 call the [place] National Suicide Prevention Lifeline: (587)235-7619 or TTY: 413-131-4915 TTY 205-753-0842) to talk to a trained counselor  [name], RN, MSN, BSN, CCM [place]  [facility], Population Health Case Manager Phone: [phone]

## 2024-01-07 DIAGNOSIS — D509 Iron deficiency anemia, unspecified: Secondary | ICD-10-CM | POA: Diagnosis not present

## 2024-01-07 DIAGNOSIS — N186 End stage renal disease: Secondary | ICD-10-CM | POA: Diagnosis not present

## 2024-01-07 DIAGNOSIS — D631 Anemia in chronic kidney disease: Secondary | ICD-10-CM | POA: Diagnosis not present

## 2024-01-07 DIAGNOSIS — E1122 Type 2 diabetes mellitus with diabetic chronic kidney disease: Secondary | ICD-10-CM | POA: Diagnosis not present

## 2024-01-07 DIAGNOSIS — T8249XA Other complication of vascular dialysis catheter, initial encounter: Secondary | ICD-10-CM | POA: Diagnosis not present

## 2024-01-07 DIAGNOSIS — N2581 Secondary hyperparathyroidism of renal origin: Secondary | ICD-10-CM | POA: Diagnosis not present

## 2024-01-07 DIAGNOSIS — Z23 Encounter for immunization: Secondary | ICD-10-CM | POA: Diagnosis not present

## 2024-01-07 DIAGNOSIS — Z992 Dependence on renal dialysis: Secondary | ICD-10-CM | POA: Diagnosis not present

## 2024-01-07 NOTE — Progress Notes (Signed)
Spoke with the pt, she will arrive on Mon at 0530. NPO post midnight.

## 2024-01-10 ENCOUNTER — Ambulatory Visit (HOSPITAL_COMMUNITY): Payer: 59 | Admitting: Vascular Surgery

## 2024-01-10 ENCOUNTER — Ambulatory Visit (HOSPITAL_COMMUNITY)
Admission: RE | Admit: 2024-01-10 | Discharge: 2024-01-10 | Disposition: A | Discharge status: Home / Self Care | Payer: 59 | Attending: Vascular Surgery | Admitting: Vascular Surgery

## 2024-01-10 ENCOUNTER — Ambulatory Visit (HOSPITAL_BASED_OUTPATIENT_CLINIC_OR_DEPARTMENT_OTHER): Payer: 59 | Admitting: Vascular Surgery

## 2024-01-10 ENCOUNTER — Encounter (HOSPITAL_COMMUNITY): Payer: Self-pay | Admitting: Vascular Surgery

## 2024-01-10 ENCOUNTER — Other Ambulatory Visit: Payer: Self-pay

## 2024-01-10 ENCOUNTER — Encounter (HOSPITAL_COMMUNITY)
Admission: RE | Disposition: A | Discharge status: Home / Self Care | Payer: Self-pay | Source: Home / Self Care | Attending: Vascular Surgery

## 2024-01-10 DIAGNOSIS — Z992 Dependence on renal dialysis: Secondary | ICD-10-CM | POA: Insufficient documentation

## 2024-01-10 DIAGNOSIS — E1122 Type 2 diabetes mellitus with diabetic chronic kidney disease: Secondary | ICD-10-CM | POA: Diagnosis not present

## 2024-01-10 DIAGNOSIS — N186 End stage renal disease: Secondary | ICD-10-CM | POA: Diagnosis not present

## 2024-01-10 DIAGNOSIS — I509 Heart failure, unspecified: Secondary | ICD-10-CM | POA: Diagnosis not present

## 2024-01-10 DIAGNOSIS — I5032 Chronic diastolic (congestive) heart failure: Secondary | ICD-10-CM | POA: Diagnosis not present

## 2024-01-10 DIAGNOSIS — T82590A Other mechanical complication of surgically created arteriovenous fistula, initial encounter: Secondary | ICD-10-CM | POA: Diagnosis not present

## 2024-01-10 DIAGNOSIS — K219 Gastro-esophageal reflux disease without esophagitis: Secondary | ICD-10-CM | POA: Insufficient documentation

## 2024-01-10 DIAGNOSIS — I120 Hypertensive chronic kidney disease with stage 5 chronic kidney disease or end stage renal disease: Secondary | ICD-10-CM | POA: Diagnosis not present

## 2024-01-10 DIAGNOSIS — I132 Hypertensive heart and chronic kidney disease with heart failure and with stage 5 chronic kidney disease, or end stage renal disease: Secondary | ICD-10-CM | POA: Diagnosis not present

## 2024-01-10 DIAGNOSIS — N185 Chronic kidney disease, stage 5: Secondary | ICD-10-CM

## 2024-01-10 DIAGNOSIS — Z794 Long term (current) use of insulin: Secondary | ICD-10-CM | POA: Diagnosis not present

## 2024-01-10 DIAGNOSIS — T82898A Other specified complication of vascular prosthetic devices, implants and grafts, initial encounter: Secondary | ICD-10-CM | POA: Diagnosis not present

## 2024-01-10 HISTORY — DX: Essential (primary) hypertension: I10

## 2024-01-10 HISTORY — PX: LIGATION OF COMPETING BRANCHES OF ARTERIOVENOUS FISTULA: SHX5949

## 2024-01-10 HISTORY — PX: FISTULA SUPERFICIALIZATION: SHX6341

## 2024-01-10 LAB — POCT I-STAT, CHEM 8
BUN: 53 mg/dL — ABNORMAL HIGH (ref 8–23)
Calcium, Ion: 1.06 mmol/L — ABNORMAL LOW (ref 1.15–1.40)
Chloride: 108 mmol/L (ref 98–111)
Creatinine, Ser: 8.8 mg/dL — ABNORMAL HIGH (ref 0.44–1.00)
Glucose, Bld: 91 mg/dL (ref 70–99)
HCT: 36 % (ref 36.0–46.0)
Hemoglobin: 12.2 g/dL (ref 12.0–15.0)
Potassium: 3.5 mmol/L (ref 3.5–5.1)
Sodium: 143 mmol/L (ref 135–145)
TCO2: 22 mmol/L (ref 22–32)

## 2024-01-10 LAB — GLUCOSE, CAPILLARY
Glucose-Capillary: 94 mg/dL (ref 70–99)
Glucose-Capillary: 88 mg/dL (ref 70–99)

## 2024-01-10 SURGERY — FISTULA SUPERFICIALIZATION
Anesthesia: General | Site: Arm Upper | Laterality: Left

## 2024-01-10 MED ORDER — INSULIN ASPART 100 UNIT/ML IJ SOLN: 0.0000 [IU] | INTRAMUSCULAR | Status: DC | PRN

## 2024-01-10 MED ORDER — OXYCODONE HCL 5 MG PO TABS
5.0000 mg | ORAL_TABLET | Freq: Once | ORAL | Status: AC
  Administered 2024-01-10: 5 mg via ORAL

## 2024-01-10 MED ORDER — MIDAZOLAM HCL 2 MG/2ML IJ SOLN
INTRAMUSCULAR | Status: AC
  Filled 2024-01-10: qty 2

## 2024-01-10 MED ORDER — PROPOFOL 10 MG/ML IV BOLUS
INTRAVENOUS | Status: DC | PRN
  Administered 2024-01-10: 100 mg via INTRAVENOUS

## 2024-01-10 MED ORDER — DEXAMETHASONE SODIUM PHOSPHATE 10 MG/ML IJ SOLN
INTRAMUSCULAR | Status: DC | PRN
  Administered 2024-01-10: 5 mg via INTRAVENOUS

## 2024-01-10 MED ORDER — FENTANYL CITRATE (PF) 100 MCG/2ML IJ SOLN
INTRAMUSCULAR | Status: AC
  Administered 2024-01-10: 25 ug via INTRAVENOUS
  Filled 2024-01-10: qty 2

## 2024-01-10 MED ORDER — CHLORHEXIDINE GLUCONATE 0.12 % MT SOLN
15.0000 mL | Freq: Once | OROMUCOSAL | Status: AC
  Administered 2024-01-10: 15 mL via OROMUCOSAL
  Filled 2024-01-10: qty 15

## 2024-01-10 MED ORDER — ACETAMINOPHEN 500 MG PO TABS
1000.0000 mg | ORAL_TABLET | Freq: Once | ORAL | Status: DC
  Filled 2024-01-10: qty 2

## 2024-01-10 MED ORDER — ORAL CARE MOUTH RINSE: 15.0000 mL | Freq: Once | OROMUCOSAL | Status: AC

## 2024-01-10 MED ORDER — PHENYLEPHRINE HCL-NACL 20-0.9 MG/250ML-% IV SOLN
INTRAVENOUS | Status: DC | PRN
  Administered 2024-01-10: 20 ug/min via INTRAVENOUS

## 2024-01-10 MED ORDER — ROCURONIUM BROMIDE 10 MG/ML (PF) SYRINGE
PREFILLED_SYRINGE | INTRAVENOUS | Status: AC
  Filled 2024-01-10: qty 10

## 2024-01-10 MED ORDER — DEXAMETHASONE SODIUM PHOSPHATE 10 MG/ML IJ SOLN
INTRAMUSCULAR | Status: AC
  Filled 2024-01-10: qty 1

## 2024-01-10 MED ORDER — SODIUM CHLORIDE 0.9% FLUSH: 3.0000 mL | Freq: Two times a day (BID) | INTRAVENOUS | Status: DC

## 2024-01-10 MED ORDER — MIDAZOLAM HCL 2 MG/2ML IJ SOLN
INTRAMUSCULAR | Status: DC | PRN
  Administered 2024-01-10: 2 mg via INTRAVENOUS

## 2024-01-10 MED ORDER — FENTANYL CITRATE (PF) 100 MCG/2ML IJ SOLN
25.0000 ug | INTRAMUSCULAR | Status: DC | PRN
  Administered 2024-01-10: 25 ug via INTRAVENOUS

## 2024-01-10 MED ORDER — FENTANYL CITRATE (PF) 250 MCG/5ML IJ SOLN
INTRAMUSCULAR | Status: AC
  Filled 2024-01-10: qty 5

## 2024-01-10 MED ORDER — ONDANSETRON HCL 4 MG/2ML IJ SOLN
INTRAMUSCULAR | Status: AC
  Filled 2024-01-10: qty 2

## 2024-01-10 MED ORDER — OXYCODONE HCL 5 MG PO TABS
ORAL_TABLET | ORAL | Status: AC
  Filled 2024-01-10: qty 1

## 2024-01-10 MED ORDER — CHLORHEXIDINE GLUCONATE 4 % EX SOLN: 60.0000 mL | Freq: Once | CUTANEOUS | Status: DC

## 2024-01-10 MED ORDER — PHENYLEPHRINE 80 MCG/ML (10ML) SYRINGE FOR IV PUSH (FOR BLOOD PRESSURE SUPPORT)
PREFILLED_SYRINGE | INTRAVENOUS | Status: AC
  Filled 2024-01-10: qty 10

## 2024-01-10 MED ORDER — 0.9 % SODIUM CHLORIDE (POUR BTL) OPTIME
TOPICAL | Status: DC | PRN
  Administered 2024-01-10: 1000 mL

## 2024-01-10 MED ORDER — FENTANYL CITRATE (PF) 100 MCG/2ML IJ SOLN
INTRAMUSCULAR | Status: DC | PRN
  Administered 2024-01-10 (×2): 50 ug via INTRAVENOUS

## 2024-01-10 MED ORDER — ONDANSETRON HCL 4 MG/2ML IJ SOLN
INTRAMUSCULAR | Status: DC | PRN
  Administered 2024-01-10: 4 mg via INTRAVENOUS

## 2024-01-10 MED ORDER — ONDANSETRON HCL 4 MG/2ML IJ SOLN: 4.0000 mg | Freq: Once | INTRAMUSCULAR | Status: DC | PRN

## 2024-01-10 MED ORDER — PROPOFOL 10 MG/ML IV BOLUS
INTRAVENOUS | Status: AC
  Filled 2024-01-10: qty 20

## 2024-01-10 MED ORDER — CEFAZOLIN SODIUM-DEXTROSE 2-4 GM/100ML-% IV SOLN
2.0000 g | INTRAVENOUS | Status: AC
  Administered 2024-01-10: 2 g via INTRAVENOUS
  Filled 2024-01-10: qty 100

## 2024-01-10 MED ORDER — LIDOCAINE 2% (20 MG/ML) 5 ML SYRINGE
INTRAMUSCULAR | Status: AC
  Filled 2024-01-10: qty 5

## 2024-01-10 MED ORDER — SODIUM CHLORIDE 0.9% FLUSH: 3.0000 mL | INTRAVENOUS | Status: DC | PRN

## 2024-01-10 MED ORDER — ACETAMINOPHEN 500 MG PO TABS
1000.0000 mg | ORAL_TABLET | Freq: Once | ORAL | Status: AC
  Administered 2024-01-10: 1000 mg via ORAL

## 2024-01-10 MED ORDER — LIDOCAINE 2% (20 MG/ML) 5 ML SYRINGE
INTRAMUSCULAR | Status: DC | PRN
  Administered 2024-01-10: 60 mg via INTRAVENOUS

## 2024-01-10 MED ORDER — SODIUM CHLORIDE 0.9 % IV SOLN: INTRAVENOUS | Status: DC | PRN

## 2024-01-10 MED ORDER — OXYCODONE-ACETAMINOPHEN 5-325 MG PO TABS: 1.0000 | ORAL_TABLET | Freq: Four times a day (QID) | ORAL | 0 refills | Status: DC | PRN

## 2024-01-10 SURGICAL SUPPLY — 28 items
ARMBAND PINK RESTRICT EXTREMIT (MISCELLANEOUS) ×1 IMPLANT
BAG COUNTER SPONGE SURGICOUNT (BAG) ×1 IMPLANT
CANISTER SUCT 3000ML PPV (MISCELLANEOUS) ×1 IMPLANT
CLIP TI MEDIUM 6 (CLIP) ×1 IMPLANT
CLIP TI WIDE RED SMALL 6 (CLIP) ×1 IMPLANT
COVER PROBE W GEL 5X96 (DRAPES) IMPLANT
DERMABOND ADVANCED .7 DNX12 (GAUZE/BANDAGES/DRESSINGS) ×1 IMPLANT
ELECT REM PT RETURN 9FT ADLT (ELECTROSURGICAL) ×1
ELECTRODE REM PT RTRN 9FT ADLT (ELECTROSURGICAL) ×1 IMPLANT
GLOVE BIO SURGEON STRL SZ7.5 (GLOVE) ×1 IMPLANT
GLOVE BIOGEL PI IND STRL 6.5 (GLOVE) IMPLANT
GLOVE BIOGEL PI IND STRL 8 (GLOVE) ×1 IMPLANT
GLOVE SURG SS PI 6.5 STRL IVOR (GLOVE) IMPLANT
GLOVE SURG SS PI 7.5 STRL IVOR (GLOVE) IMPLANT
GOWN STRL REUS W/ TWL LRG LVL3 (GOWN DISPOSABLE) ×2 IMPLANT
GOWN STRL REUS W/ TWL XL LVL3 (GOWN DISPOSABLE) ×2 IMPLANT
KIT BASIN OR (CUSTOM PROCEDURE TRAY) ×1 IMPLANT
KIT TURNOVER KIT B (KITS) ×1 IMPLANT
NS IRRIG 1000ML POUR BTL (IV SOLUTION) ×1 IMPLANT
PACK CV ACCESS (CUSTOM PROCEDURE TRAY) ×1 IMPLANT
PAD ARMBOARD 7.5X6 YLW CONV (MISCELLANEOUS) ×2 IMPLANT
SUT MNCRL AB 4-0 PS2 18 (SUTURE) ×1 IMPLANT
SUT PROLENE 6 0 BV (SUTURE) IMPLANT
SUT PROLENE 7 0 BV 1 (SUTURE) ×1 IMPLANT
SUT VIC AB 3-0 SH 27X BRD (SUTURE) ×2 IMPLANT
TOWEL GREEN STERILE (TOWEL DISPOSABLE) ×1 IMPLANT
UNDERPAD 30X36 HEAVY ABSORB (UNDERPADS AND DIAPERS) ×1 IMPLANT
WATER STERILE IRR 1000ML POUR (IV SOLUTION) ×1 IMPLANT

## 2024-01-10 NOTE — Op Note (Signed)
Date: January 10, 2024  Preoperative diagnosis: End-stage renal disease with too deep to cannulate left brachiocephalic AV fistula  Postoperative diagnosis: Same  Procedure: Left arm AV fistula revision with sidebranch ligation and superficialization  Surgeon: Dr. [name], MD  Assistant: Doreatha Massed, PA  Indications: 68 year old female with end-stage renal disease that presents with left brachiocephalic AV fistula that remains difficult to cannulate and it is hard to feel a thrill in the upper arm.  She presents for revision after risks benefits discussed.  An assistant was needed given the complexity of the case and to mobilize the fistula.  Findings: Two skip incisions over the left upper arm cephalic vein fistula.  Multiple side branches were ligated and divided.  I raised the fistula by closing the subcutaneous tissue under the fistula. The skin was closed directly over the fistula.  Good thrill at completion.  Anesthesia: LMA  Details: Patient was taken to the operating room after informed consent was obtained.  Placed on operative table in the supine position.  After anesthesia was induced the left arm was then prepped and draped in standard sterile fashion.  Timeout was performed.  Antibiotics given.  Ultrasound was used to mark the course of the cephalic vein in her left upper arm over the brachiocephalic fistula.  I made two small skip incisions.  I then used Bovie cautery and circumferentially dissected out the cephalic vein fistula.  Sidebranches were ligated with 2-0 silk ties and divided.  I then closed the subcutaneous tissue under the fistula with interrupted 3-0 Vicryl's to make this more superficial in the arm.  The skin was closed directly over the fistula with 4-0 Monocryl Dermabond.  Good thrill at completion.  Complication: None  Condition: Stable  [name], MD [facility] [place] Office: [phone]   [name]

## 2024-01-10 NOTE — Anesthesia Preprocedure Evaluation (Addendum)
Anesthesia Evaluation  Patient identified by MRN, date of birth, ID band Patient awake    Reviewed: Allergy & Precautions, NPO status , Patient's Chart, lab work & pertinent test results  Airway Mallampati: II  TM Distance: >3 FB Neck ROM: Full    Dental  (+) Dental Advisory Given, Partial Upper   Pulmonary sleep apnea    Pulmonary exam normal breath sounds clear to auscultation       Cardiovascular hypertension, Pt. on medications (-) angina +CHF  (-) Past MI Normal cardiovascular exam Rhythm:Regular Rate:Normal     Neuro/Psych  Neuromuscular disease  negative psych ROS   GI/Hepatic ,GERD  Medicated,,(+) Hepatitis -, C  Endo/Other  diabetes, Type 2, Insulin Dependent  Obesity   Renal/GU ESRF and DialysisRenal disease     Musculoskeletal negative musculoskeletal ROS (+)    Abdominal   Peds  Hematology negative hematology ROS (+)   Anesthesia Other Findings Day of surgery medications reviewed with the patient.  Reproductive/Obstetrics                              Anesthesia Physical Anesthesia Plan  ASA: 3  Anesthesia Plan: General   Post-op Pain Management: Tylenol PO (pre-op)*   Induction: Intravenous  PONV Risk Score and Plan: 3 and Dexamethasone and Ondansetron  Airway Management Planned: LMA  Additional Equipment:   Intra-op Plan:   Post-operative Plan: Extubation in OR  Informed Consent: I have reviewed the patients History and Physical, chart, labs and discussed the procedure including the risks, benefits and alternatives for the proposed anesthesia with the patient or authorized representative who has indicated his/her understanding and acceptance.     Dental advisory given  Plan Discussed with: CRNA  Anesthesia Plan Comments:        Anesthesia Quick Evaluation

## 2024-01-10 NOTE — Anesthesia Procedure Notes (Signed)
Procedure Name: LMA Insertion Date/Time: 01/10/2024 7:45 AM  Performed by: Thomasene Ripple, CRNAPre-anesthesia Checklist: Patient identified, Emergency Drugs available, Suction available and Patient being monitored Patient Re-evaluated:Patient Re-evaluated prior to induction Oxygen Delivery Method: Circle System Utilized Preoxygenation: Pre-oxygenation with 100% oxygen Induction Type: IV induction LMA: LMA inserted LMA Size: 4.0 Number of attempts: 1 Placement Confirmation: positive ETCO2 and breath sounds checked- equal and bilateral Tube secured with: Tape Dental Injury: Teeth and Oropharynx as per pre-operative assessment

## 2024-01-10 NOTE — H&P (Signed)
History and Physical Interval Note:  01/10/2024 7:21 AM  Nancy [name]  has presented today for surgery, with the diagnosis of End Stage Renal Disease.  The various methods of treatment have been discussed with the patient and family. After consideration of risks, benefits and other options for treatment, the patient has consented to  Procedure(s): LEFT ARM FISTULA SUPERFICIALIZATION (Left) as a surgical intervention.  The patient's history has been reviewed, patient examined, no change in status, stable for surgery.  I have reviewed the patient's chart and labs.  Questions were answered to the patient's satisfaction.     [name]   POST OPERATIVE OFFICE NOTE       CC:  F/u for surgery   HPI:  This is a 68 y.o. female who is s/p left BC AVF on 10/22/2023 by Dr. Chestine Spore.    She was seen 11/23/2023 and at that time, she had a palpable thrill in the fistula but duplex demonstrated low flow velocities and she was instructed to work with stress ball and come back for follow up duplex.    Pt states she does not have pain/numbness in the left hand.     The pt is on dialysis M/W/F at [facility] [address] location.     Allergies       Allergies  Allergen Reactions   Ace Inhibitors Other (See Comments) and Cough      Persistent dry cough   Amitriptyline Hcl Nausea And Vomiting and Other (See Comments)      GI Intolerance/Upset stomach   Aspirin Other (See Comments)      GI Intolerance   Latex Itching   Prednisone Nausea And Vomiting              Current Outpatient Medications  Medication Sig Dispense Refill   ACCU-CHEK FASTCLIX LANCETS MISC Use to check blood sugar up to 3 times a day 306 each 3   ACCU-CHEK GUIDE test strip CHECK BLOOD SUGAR 3 TIMES A DAY 300 strip 3   atorvastatin (LIPITOR) 40 MG tablet Take 1 tablet (40 mg total) by mouth daily. 90 tablet 3   Blood Glucose Monitoring Suppl (ACCU-CHEK GUIDE) w/Device KIT 1 each by Does not apply route 3 (three) times daily.  Check blood sugar 3 times a day 1 kit 0   Blood Pressure Monitoring (ADVANCED ONE STEP BP MONITOR) MISC 1 Units by Does not apply route daily. 1 each 0   Cholecalciferol (VITAMIN D3) 25 MCG (1000 UT) CAPS Take 1 capsule by mouth daily.       Continuous Glucose Receiver (DEXCOM G7 RECEIVER) DEVI To check blood sugars twice a day 1 each 3   Continuous Glucose Sensor (DEXCOM G7 SENSOR) MISC Use to check blood sugars twice a day 3 each 3   cyclobenzaprine (FLEXERIL) 10 MG tablet TAKE 1 TABLET BY MOUTH EVERYDAY AT BEDTIME 30 tablet 1   diclofenac Sodium (VOLTAREN) 1 % GEL Apply 2 g topically 4 (four) times daily. 150 g 0   dicyclomine (BENTYL) 20 MG tablet Take 1 tablet (20 mg total) by mouth every 6 (six) hours as needed for spasms. 30 tablet 1   diltiazem (CARDIZEM CD) 360 MG 24 hr capsule Take 1 capsule (360 mg total) by mouth daily. 90 capsule 3   ezetimibe (ZETIA) 10 MG tablet Take 1 tablet (10 mg total) by mouth daily. 90 tablet 3   Glucagon (GVOKE HYPOPEN 2-PACK) 0.5 MG/0.1ML SOAJ Inject 0.5 mg into the skin as needed. 0.2 mL 3  insulin glargine (LANTUS) 100 UNIT/ML injection Inject 0.1 mLs (10 Units total) into the skin daily. 3 mL 0   insulin lispro (HUMALOG) 100 UNIT/ML KwikPen For sugar 120 to 150 use one unit, for 151 to 200 use two units, for 201 to 250 use three units, for 251 to 300 use five units, for 301 to 350 use seven units, for 351 to 400 use 9 units, if sugar more than 400 call your primary care provider. 15 mL 0   Insulin Pen Needle (PEN NEEDLES) 31G X 5 MM MISC 1 each by Does not apply route 3 (three) times daily with meals. May substitute to any manufacturer covered by patient's insurance. 100 each 0   Insulin Syringe-Needle U-100 31G X 15/64" 0.5 ML MISC Use to inject insulin one time a day 100 each 0   Insulin Syringes, Disposable, U-100 1 ML MISC 2 Syringes by Does not apply route daily. Use to inject insulin into the skin 1 time daily. diag code E11.9. Insulin dependent 100  each 0   omeprazole (PRILOSEC) 40 MG capsule TAKE 1 CAPSULE BY MOUTH EVERY DAY 90 capsule 3   oxyCODONE (ROXICODONE) 5 MG immediate release tablet Take 1 tablet (5 mg total) by mouth every 4 (four) hours as needed for severe pain (pain score 7-10). 10 tablet 0   senna (SENOKOT) 8.6 MG tablet Take 1 tablet (8.6 mg total) by mouth as needed for constipation. 60 tablet 3      No current facility-administered medications for this visit.         ROS:  See HPI   Physical Exam:      Today's Vitals    01/04/24 1207  BP: (!) 150/76  Pulse: (!) 54  Resp: 18  Temp: 98 F (36.7 C)  TempSrc: Temporal  SpO2: 90%  Weight: 144 lb 11.2 oz (65.6 kg)  Height: 4\' 10"  (1.473 m)  PainSc: 0-No pain    Body mass index is 30.24 kg/m.     Incision:  well healed Extremities:   There is a palpable left radial pulse.   Motor and sensory are in tact.   There is a thrill/bruit present.  Access is  easily palpable at the antecubital space and the upper arm.  It is palpable in the mid upper arm but more difficult to feel.      Dialysis Duplex on 01/04/2024: Findings:  +--------------------+----------+-----------------+--------+  AVF                PSV (cm/s)Flow Vol (mL/min)Comments  +--------------------+----------+-----------------+--------+  Native artery inflow   199           743                 +--------------------+----------+-----------------+--------+  AVF Anastomosis        247                               +--------------------+----------+-----------------+--------+   +------------+----------+-------------+----------+---------------+  OUTFLOW VEINPSV (cm/s)Diameter (cm)Depth (cm)   Describe      +------------+----------+-------------+----------+---------------+  Shoulder      263        0.69        1.10                    +------------+----------+-------------+----------+---------------+  Prox UA        245        0.58  1.06   mild tortuosity   +------------+----------+-------------+----------+---------------+  Mid UA         105        0.70        0.59                    +------------+----------+-------------+----------+---------------+  Dist UA        209        0.85        0.24                    +------------+----------+-------------+----------+---------------+  AC Fossa       553        1.32        0.20                    +------------+----------+-------------+----------+---------------+      Assessment/Plan:  This is a 68 y.o. female who is s/p:  left BC AVF on 10/22/2023 by Dr. Chestine Spore.      -the pt does not have evidence of steal. -duplex shows the fistula is maturing nicely.  It is easily palpable at the antecubital space and upper arm, however it os more difficult to palpate in the mid arm and will need superficialization.  We will schedule this on a non dialysis day with Dr. Chestine Spore.   -if pt has tunneled catheter, this can be removed at the discretion of the dialysis center once the pt's access has been successfully cannulated to their satisfaction.  -discussed with pt that access does not last forever and will need intervention or even new access at some point.  She expressed understanding.       Doreatha Massed, [facility] [phone]   Clinic MD:  pt seen with Dr. Chestine Spore

## 2024-01-10 NOTE — Transfer of Care (Signed)
Immediate Anesthesia Transfer of Care Note  Patient: Nancy [name]  Procedure(s) Performed: LEFT ARM FISTULA SUPERFICIALIZATION (Left: Arm Upper) REVISION OF ARTERIOVENOUS FISTULA WITH SIDE BRANCH LIGATION (Left: Arm Upper)  Patient Location: PACU  Anesthesia Type:General  Level of Consciousness: awake, alert , and oriented  Airway & Oxygen Therapy: Patient Spontanous Breathing and Patient connected to face mask oxygen  Post-op Assessment: Report given to RN and Post -op Vital signs reviewed and stable  Post vital signs: Reviewed and stable  Last Vitals:  Vitals Value Taken Time  BP 159/83 01/10/24 0853  Temp    Pulse 54 01/10/24 0856  Resp 16 01/10/24 0856  SpO2 96 % 01/10/24 0856  Vitals shown include unfiled device data.  Last Pain:  Vitals:   01/10/24 0642  TempSrc:   PainSc: 0-No pain      Patients Stated Pain Goal: 0 (01/10/24 1610)  Complications: No notable events documented.

## 2024-01-10 NOTE — Discharge Instructions (Signed)
   [facility]  Discharge Instructions  AV Fistula or Graft Surgery for Dialysis Access  Please refer to the following instructions for your post-procedure care. Your surgeon or physician assistant will discuss any changes with you.  Activity  You may drive the day following your surgery, if you are comfortable and no longer taking prescription pain medication. Resume full activity as the soreness in your incision resolves.  Bathing/Showering  You may shower after you go home. Keep your incision dry for 48 hours. Do not soak in a bathtub, hot tub, or swim until the incision heals completely. You may not shower if you have a hemodialysis catheter.  Incision Care  Clean your incision with mild soap and water after 48 hours. Pat the area dry with a clean towel. You do not need a bandage unless otherwise instructed. Do not apply any ointments or creams to your incision. You may have skin glue on your incision. Do not peel it off. It will come off on its own in about one week. Your arm may swell a bit after surgery. To reduce swelling use pillows to elevate your arm so it is above your heart. Your doctor will tell you if you need to lightly wrap your arm with an ACE bandage.  Diet  Resume your normal diet. There are not special food restrictions following this procedure. In order to heal from your surgery, it is CRITICAL to get adequate nutrition. Your body requires vitamins, minerals, and protein. Vegetables are the best source of vitamins and minerals. Vegetables also provide the perfect balance of protein. Processed food has little nutritional value, so try to avoid this.  Medications  Resume taking all of your medications. If your incision is causing pain, you may take over-the counter pain relievers such as acetaminophen (Tylenol). If you were prescribed a stronger pain medication, please be aware these medications can cause nausea and constipation. Prevent  nausea by taking the medication with a snack or meal. Avoid constipation by drinking plenty of fluids and eating foods with high amount of fiber, such as fruits, vegetables, and grains.  Do not take Tylenol if you are taking prescription pain medications.  Follow up Your surgeon may want to see you in the office following your access surgery. If so, this will be arranged at the time of your surgery.  Please call us immediately for any of the following conditions:  Increased pain, redness, drainage (pus) from your incision site Fever of 101 degrees or higher Severe or worsening pain at your incision site Hand pain or numbness.  Reduce your risk of vascular disease:  Stop smoking. If you would like help, call [facility] at 1-800-QUIT-NOW (216-237-6900) or [place] at [phone]  Manage your cholesterol Maintain a desired weight Control your diabetes Keep your blood pressure down  Dialysis  It will take several weeks to several months for your new dialysis access to be ready for use. Your surgeon will determine when it is okay to use it. Your nephrologist will continue to direct your dialysis. You can continue to use your Permcath until your new access is ready for use.   01/10/2024 Nancy [name] [id number] December 25, 1955  Surgeon(s): [name], MD  Procedure(s): LEFT ARM FISTULA SUPERFICIALIZATION REVISION OF ARTERIOVENOUS FISTULA WITH SIDE BRANCH LIGATION  x Do not stick fistula for 6 weeks    If you have any questions, please call the office at [phone].

## 2024-01-10 NOTE — Anesthesia Postprocedure Evaluation (Signed)
Anesthesia Post Note  Patient: Nancy [name]  Procedure(s) Performed: LEFT ARM FISTULA SUPERFICIALIZATION (Left: Arm Upper) REVISION OF ARTERIOVENOUS FISTULA WITH SIDE BRANCH LIGATION (Left: Arm Upper)     Patient location during evaluation: PACU Anesthesia Type: General Level of consciousness: awake and alert Pain management: pain level controlled Vital Signs Assessment: post-procedure vital signs reviewed and stable Respiratory status: spontaneous breathing, nonlabored ventilation and respiratory function stable Cardiovascular status: blood pressure returned to baseline and stable Postop Assessment: no apparent nausea or vomiting Anesthetic complications: no   No notable events documented.  Last Vitals:  Vitals:   01/10/24 0915 01/10/24 0945  BP: (!) 169/88   Pulse: (!) 48 (!) 47  Resp: 13 16  Temp:  (!) 36.1 C  SpO2: 90% 94%    Last Pain:  Vitals:   01/10/24 0920  TempSrc:   PainSc: 6                  Collene Schlichter

## 2024-01-11 ENCOUNTER — Encounter (HOSPITAL_COMMUNITY): Payer: Self-pay | Admitting: Vascular Surgery

## 2024-01-11 ENCOUNTER — Telehealth: Payer: Self-pay

## 2024-01-11 NOTE — Patient Outreach (Signed)
  Care Coordination   01/11/2024 Name: Fransheska Willingham MRN: [id number] DOB: 20-Jun-1956   Care Coordination Outreach Attempts:  RNCM called to follow up. Ms. Rahrig states she is walking out the door to run errands and request RNCM call her tomorrow.  Follow Up Plan:  patient scheduled for outreach on 01/12/24  Encounter Outcome:  No Answer   Care Coordination Interventions:  No, not indicated    Heddy Shutter, RN, MSN, BSN, CCM [place]  [facility], Population Health Case Manager Phone: [phone]

## 2024-01-12 ENCOUNTER — Telehealth: Payer: Self-pay

## 2024-01-12 DIAGNOSIS — Z992 Dependence on renal dialysis: Secondary | ICD-10-CM | POA: Diagnosis not present

## 2024-01-12 DIAGNOSIS — N2581 Secondary hyperparathyroidism of renal origin: Secondary | ICD-10-CM | POA: Diagnosis not present

## 2024-01-12 DIAGNOSIS — T8249XA Other complication of vascular dialysis catheter, initial encounter: Secondary | ICD-10-CM | POA: Diagnosis not present

## 2024-01-12 DIAGNOSIS — N186 End stage renal disease: Secondary | ICD-10-CM | POA: Diagnosis not present

## 2024-01-12 DIAGNOSIS — E1122 Type 2 diabetes mellitus with diabetic chronic kidney disease: Secondary | ICD-10-CM | POA: Diagnosis not present

## 2024-01-12 DIAGNOSIS — Z23 Encounter for immunization: Secondary | ICD-10-CM | POA: Diagnosis not present

## 2024-01-12 DIAGNOSIS — D509 Iron deficiency anemia, unspecified: Secondary | ICD-10-CM | POA: Diagnosis not present

## 2024-01-12 NOTE — Patient Outreach (Signed)
  Care Coordination   01/12/2024 Name: Nancy [name] MRN: [id number] DOB: [birth year]   Care Coordination Outreach Attempts:  An unsuccessful outreach was attempted for an appointment today.  Follow Up Plan:  Additional outreach attempts will be made to offer the patient complex care management information and services.   Encounter Outcome:  No Answer   Care Coordination Interventions:  No, not indicated    Heddy Shutter, RN, MSN, BSN, CCM [place]  [facility], Population Health Case Manager Phone: [phone]

## 2024-01-13 ENCOUNTER — Ambulatory Visit: Payer: 59 | Admitting: Emergency Medicine

## 2024-01-14 ENCOUNTER — Emergency Department (HOSPITAL_COMMUNITY)
Admission: EM | Admit: 2024-01-14 | Discharge: 2024-01-14 | Disposition: A | Discharge status: Home / Self Care | Payer: 59 | Attending: Emergency Medicine | Admitting: Emergency Medicine

## 2024-01-14 ENCOUNTER — Emergency Department (HOSPITAL_COMMUNITY): Payer: 59

## 2024-01-14 DIAGNOSIS — N2581 Secondary hyperparathyroidism of renal origin: Secondary | ICD-10-CM | POA: Diagnosis not present

## 2024-01-14 DIAGNOSIS — T82898A Other specified complication of vascular prosthetic devices, implants and grafts, initial encounter: Secondary | ICD-10-CM

## 2024-01-14 DIAGNOSIS — Z23 Encounter for immunization: Secondary | ICD-10-CM | POA: Diagnosis not present

## 2024-01-14 DIAGNOSIS — Z992 Dependence on renal dialysis: Secondary | ICD-10-CM | POA: Diagnosis not present

## 2024-01-14 DIAGNOSIS — E1122 Type 2 diabetes mellitus with diabetic chronic kidney disease: Secondary | ICD-10-CM | POA: Diagnosis not present

## 2024-01-14 DIAGNOSIS — Z452 Encounter for adjustment and management of vascular access device: Secondary | ICD-10-CM | POA: Diagnosis not present

## 2024-01-14 DIAGNOSIS — Z794 Long term (current) use of insulin: Secondary | ICD-10-CM | POA: Insufficient documentation

## 2024-01-14 DIAGNOSIS — Z91040 Latex allergy status: Secondary | ICD-10-CM | POA: Insufficient documentation

## 2024-01-14 DIAGNOSIS — N186 End stage renal disease: Secondary | ICD-10-CM | POA: Diagnosis not present

## 2024-01-14 DIAGNOSIS — D509 Iron deficiency anemia, unspecified: Secondary | ICD-10-CM | POA: Diagnosis not present

## 2024-01-14 DIAGNOSIS — T8249XA Other complication of vascular dialysis catheter, initial encounter: Secondary | ICD-10-CM | POA: Insufficient documentation

## 2024-01-14 NOTE — ED Notes (Signed)
 Patient transported to X-ray

## 2024-01-14 NOTE — ED Provider Triage Note (Signed)
 Emergency Medicine Provider Triage Evaluation Note  Nancy [name] , a 67 y.o. female  was evaluated in triage.  Pt complains of Port issue.  Review of Systems  Positive: Port too low Negative: Fever, chills, confusion, CP, SHOB  Physical Exam  BP (!) 184/81   Pulse (!) 58   Temp 98.2 F (36.8 C) (Oral)   Resp 18   LMP 02/15/2009   SpO2 100%  Gen:   Awake, no distress   Resp:  Normal effort  MSK:   Moves extremities without difficulty  Other:    Medical Decision Making  Medically screening exam initiated at 5:56 PM.  Appropriate orders placed.  Nancy [name] was informed that the remainder of the evaluation will be completed by another provider, this initial triage assessment does not replace that evaluation, and the importance of remaining in the ED until their evaluation is complete.     Francis Ileana SAILOR, PA-C 01/14/24 1756

## 2024-01-14 NOTE — Discharge Instructions (Signed)
 This x-ray demonstrates that your dialysis port catheter has been regressed from its proper location.  I put an ambulatory referral to the interventional radiology department.  They should call you in the next 3 to 5 days for progression of the catheter.

## 2024-01-14 NOTE — ED Triage Notes (Signed)
 Pt reports dialysis center told her, her dialysis catheter in her chest "may be coming out." Pt reports she has no complaints.

## 2024-01-14 NOTE — ED Provider Notes (Signed)
 [place] EMERGENCY DEPARTMENT AT [facility] Provider Note   CSN: [id number] Arrival date & time: 01/14/24  1540     History  Chief Complaint  Patient presents with   Vascular Access Problem    Nancy [name] is a 68 y.o. female.  Patient is a 68 year old female presenting from dialysis due to concerns for port coming out.  The history is provided by the patient. No language interpreter was used.       Home Medications Prior to Admission medications   Medication Sig Start Date End Date Taking? Authorizing Provider  ACCU-CHEK FASTCLIX LANCETS MISC Use to check blood sugar up to 3 times a day 10/11/18   Feliberto Cambric, MD  ACCU-CHEK GUIDE test strip CHECK BLOOD SUGAR 3 TIMES A DAY 03/30/23   Purcell Emil Schanz, MD  atorvastatin  (LIPITOR) 40 MG tablet Take 1 tablet (40 mg total) by mouth daily. 12/16/23   [name], Miguel Jose, MD  Blood Glucose Monitoring Suppl (ACCU-CHEK GUIDE) w/Device KIT 1 each by Does not apply route 3 (three) times daily. Check blood sugar 3 times a day 06/19/23   Arrien, Elidia Sieving, MD  Blood Pressure Monitoring (ADVANCED ONE STEP BP MONITOR) MISC 1 Units by Does not apply route daily. 07/26/23   Santo Stanly LABOR, MD  Cholecalciferol (VITAMIN D3) 25 MCG (1000 UT) CAPS Take 1,000 Units by mouth daily.    [provider]  cyclobenzaprine  (FLEXERIL ) 10 MG tablet TAKE 1 TABLET BY MOUTH EVERYDAY AT BEDTIME 07/12/23   Norleen Lynwood ORN, MD  dicyclomine  (BENTYL ) 20 MG tablet Take 1 tablet (20 mg total) by mouth every 6 (six) hours as needed for spasms. 11/23/23   [name], Miguel Jose, MD  diltiazem  (CARDIZEM  CD) 240 MG 24 hr capsule Take 240 mg by mouth daily. 12/27/23   [provider]  ezetimibe  (ZETIA ) 10 MG tablet Take 10 mg by mouth daily.    [provider]  Glucagon  (GVOKE HYPOPEN  2-PACK) 0.5 MG/0.1ML SOAJ Inject 0.5 mg into the skin as needed. 08/17/23   [name], Miguel Jose, MD  insulin  glargine (LANTUS ) 100 UNIT/ML  injection Inject 22 Units into the skin daily.    [provider]  insulin  lispro (HUMALOG ) 100 UNIT/ML KwikPen For sugar 120 to 150 use one unit, for 151 to 200 use two units, for 201 to 250 use three units, for 251 to 300 use five units, for 301 to 350 use seven units, for 351 to 400 use 9 units, if sugar more than 400 call your primary care provider. 06/19/23   Arrien, Mauricio Daniel, MD  Insulin  Pen Needle (PEN NEEDLES) 31G X 5 MM MISC 1 each by Does not apply route 3 (three) times daily with meals. May substitute to any manufacturer covered by patient's insurance. 06/19/23   Arrien, Elidia Sieving, MD  Insulin  Syringe-Needle U-100 31G X 15/64 0.5 ML MISC Use to inject insulin  one time a day 06/19/23   Arrien, Elidia Sieving, MD  Insulin  Syringes, Disposable, U-100 1 ML MISC 2 Syringes by Does not apply route daily. Use to inject insulin  into the skin 1 time daily. diag code E11.9. Insulin  dependent 06/19/23   Arrien, Elidia Sieving, MD  omeprazole  (PRILOSEC) 40 MG capsule TAKE 1 CAPSULE BY MOUTH EVERY DAY 03/30/23   Purcell Emil Schanz, MD  oxyCODONE -acetaminophen  (PERCOCET) 5-325 MG tablet Take 1 tablet by mouth every 6 (six) hours as needed for severe pain (pain score 7-10). 01/10/24   Rhyne, Samantha J, PA-C  senna (SENOKOT) 8.6  MG tablet Take 1 tablet (8.6 mg total) by mouth as needed for constipation. 07/08/20   Jinwala, Sagar H, MD      Allergies    Ace inhibitors, Amitriptyline hcl, Aspirin , Latex, and Prednisone     Review of Systems   Review of Systems  Constitutional:  Negative for chills and fever.  HENT:  Negative for ear pain and sore throat.   Eyes:  Negative for pain and visual disturbance.  Respiratory:  Negative for cough and shortness of breath.   Cardiovascular:  Negative for chest pain and palpitations.  Gastrointestinal:  Negative for abdominal pain and vomiting.  Genitourinary:  Negative for dysuria and hematuria.  Musculoskeletal:  Negative for arthralgias and  back pain.  Skin:  Negative for color change and rash.  Neurological:  Negative for seizures and syncope.  All other systems reviewed and are negative.   Physical Exam Updated Vital Signs BP (!) 172/84   Pulse (!) 58   Temp 98.2 F (36.8 C) (Oral)   Resp 18   LMP 02/15/2009   SpO2 97%  Physical Exam Vitals and nursing note reviewed.  Constitutional:      General: She is not in acute distress.    Appearance: She is well-developed.  HENT:     Head: Normocephalic and atraumatic.  Eyes:     Conjunctiva/sclera: Conjunctivae normal.  Cardiovascular:     Rate and Rhythm: Normal rate and regular rhythm.     Heart sounds: No murmur heard. Pulmonary:     Effort: Pulmonary effort is normal. No respiratory distress.     Breath sounds: Normal breath sounds.  Chest:    Abdominal:     Palpations: Abdomen is soft.     Tenderness: There is no abdominal tenderness.  Musculoskeletal:        General: No swelling.     Cervical back: Neck supple.  Skin:    General: Skin is warm and dry.     Capillary Refill: Capillary refill takes less than 2 seconds.  Neurological:     Mental Status: She is alert.  Psychiatric:        Mood and Affect: Mood normal.     ED Results / Procedures / Treatments   Labs (all labs ordered are listed, but only abnormal results are displayed) Labs Reviewed - No data to display  EKG None  Radiology DG Chest 2 View Result Date: 01/14/2024 CLINICAL DATA:  Dialysis port issue. EXAM: CHEST - 2 VIEW COMPARISON:  June 17, 2023 FINDINGS: A right-sided venous catheter is seen with its distal tip noted at the junction of the superior vena cava and right atrium. The heart size and mediastinal contours are within normal limits. Both lungs are clear. The visualized skeletal structures are unremarkable. IMPRESSION: 1. Right-sided venous catheter positioning, as described above. 2. No acute cardiopulmonary disease. Electronically Signed   By: Suzen Dials M.D.   On:  01/14/2024 22:17    Procedures Procedures    Medications Ordered in ED Medications - No data to display  ED Course/ Medical Decision Making/ A&P                                 Medical Decision Making Amount and/or Complexity of Data Reviewed Radiology: ordered.   68 year old female presenting from dialysis due to concerns for port coming out.  Patient is alert and oriented x 3, no acute distress, afebrile, some vital signs.  Physical  exam demonstrates Approximately 4 cm of port pulled out.  No bleeding, warmth, erythema, or swelling.  No tenderness to palpation.  CXR demonstrates:  Narrative & Impression   A right-sided venous catheter is seen with its distal tip noted at the junction of the superior vena cava and right atrium. The heart size and mediastinal contours are within normal limits. Both lungs are clear. The visualized skeletal structures are unremarkable.   Spoke with our interventional radiology team who states an ambulatory referral can be placed for outpatient procedure.  Ambulatory referral placed.  Patient ready for discharge at this time.        Final Clinical Impression(s) / ED Diagnoses Final diagnoses:  Problem with dialysis access, initial encounter [facility])    Rx / DC Orders ED Discharge Orders          Ordered    Ambulatory referral to Interventional Radiology       Comments: Dialysis catheter regressed externally.  Tip of venous catheter in superior vena cava/atrium junction.   01/14/24 2324              Elnor Bernarda SQUIBB, DO 01/14/24 2326

## 2024-01-15 ENCOUNTER — Telehealth: Payer: Self-pay

## 2024-01-15 NOTE — Telephone Encounter (Signed)
 Patient called in wondering if she should go to dialysis on Monday. Awaiting IR to call to check dialysis catheter. Consulted with Dr Neysa regarding placement and further instructions. Tip is in SVC, would be ok to go to dialysis on Monday. The patient is aware.

## 2024-01-17 DIAGNOSIS — D509 Iron deficiency anemia, unspecified: Secondary | ICD-10-CM | POA: Diagnosis not present

## 2024-01-17 DIAGNOSIS — N186 End stage renal disease: Secondary | ICD-10-CM | POA: Diagnosis not present

## 2024-01-17 DIAGNOSIS — E1122 Type 2 diabetes mellitus with diabetic chronic kidney disease: Secondary | ICD-10-CM | POA: Diagnosis not present

## 2024-01-17 DIAGNOSIS — Z992 Dependence on renal dialysis: Secondary | ICD-10-CM | POA: Diagnosis not present

## 2024-01-17 DIAGNOSIS — N2581 Secondary hyperparathyroidism of renal origin: Secondary | ICD-10-CM | POA: Diagnosis not present

## 2024-01-17 DIAGNOSIS — T8249XA Other complication of vascular dialysis catheter, initial encounter: Secondary | ICD-10-CM | POA: Diagnosis not present

## 2024-01-17 DIAGNOSIS — Z23 Encounter for immunization: Secondary | ICD-10-CM | POA: Diagnosis not present

## 2024-01-19 DIAGNOSIS — Z992 Dependence on renal dialysis: Secondary | ICD-10-CM | POA: Diagnosis not present

## 2024-01-19 DIAGNOSIS — D509 Iron deficiency anemia, unspecified: Secondary | ICD-10-CM | POA: Diagnosis not present

## 2024-01-19 DIAGNOSIS — N186 End stage renal disease: Secondary | ICD-10-CM | POA: Diagnosis not present

## 2024-01-19 DIAGNOSIS — N2581 Secondary hyperparathyroidism of renal origin: Secondary | ICD-10-CM | POA: Diagnosis not present

## 2024-01-19 DIAGNOSIS — T8249XA Other complication of vascular dialysis catheter, initial encounter: Secondary | ICD-10-CM | POA: Diagnosis not present

## 2024-01-19 DIAGNOSIS — E1122 Type 2 diabetes mellitus with diabetic chronic kidney disease: Secondary | ICD-10-CM | POA: Diagnosis not present

## 2024-01-19 DIAGNOSIS — Z23 Encounter for immunization: Secondary | ICD-10-CM | POA: Diagnosis not present

## 2024-01-20 ENCOUNTER — Other Ambulatory Visit: Payer: Self-pay

## 2024-01-20 DIAGNOSIS — N186 End stage renal disease: Secondary | ICD-10-CM

## 2024-01-21 DIAGNOSIS — Z23 Encounter for immunization: Secondary | ICD-10-CM | POA: Diagnosis not present

## 2024-01-21 DIAGNOSIS — N2581 Secondary hyperparathyroidism of renal origin: Secondary | ICD-10-CM | POA: Diagnosis not present

## 2024-01-21 DIAGNOSIS — D509 Iron deficiency anemia, unspecified: Secondary | ICD-10-CM | POA: Diagnosis not present

## 2024-01-21 DIAGNOSIS — Z992 Dependence on renal dialysis: Secondary | ICD-10-CM | POA: Diagnosis not present

## 2024-01-21 DIAGNOSIS — N186 End stage renal disease: Secondary | ICD-10-CM | POA: Diagnosis not present

## 2024-01-21 DIAGNOSIS — T8249XA Other complication of vascular dialysis catheter, initial encounter: Secondary | ICD-10-CM | POA: Diagnosis not present

## 2024-01-21 DIAGNOSIS — E1122 Type 2 diabetes mellitus with diabetic chronic kidney disease: Secondary | ICD-10-CM | POA: Diagnosis not present

## 2024-01-24 DIAGNOSIS — Z23 Encounter for immunization: Secondary | ICD-10-CM | POA: Diagnosis not present

## 2024-01-24 DIAGNOSIS — N2581 Secondary hyperparathyroidism of renal origin: Secondary | ICD-10-CM | POA: Diagnosis not present

## 2024-01-24 DIAGNOSIS — Z992 Dependence on renal dialysis: Secondary | ICD-10-CM | POA: Diagnosis not present

## 2024-01-24 DIAGNOSIS — N186 End stage renal disease: Secondary | ICD-10-CM | POA: Diagnosis not present

## 2024-01-24 DIAGNOSIS — E1122 Type 2 diabetes mellitus with diabetic chronic kidney disease: Secondary | ICD-10-CM | POA: Diagnosis not present

## 2024-01-24 DIAGNOSIS — T8249XA Other complication of vascular dialysis catheter, initial encounter: Secondary | ICD-10-CM | POA: Diagnosis not present

## 2024-01-24 DIAGNOSIS — D509 Iron deficiency anemia, unspecified: Secondary | ICD-10-CM | POA: Diagnosis not present

## 2024-01-25 ENCOUNTER — Telehealth: Payer: Self-pay

## 2024-01-25 NOTE — Telephone Encounter (Signed)
Copied from CRM [id number]. Topic: Clinical - Medical Advice >> Jan 25, 2024  9:48 AM Isabell A wrote: Reason for CRM: Patient would like to know her blood type.

## 2024-01-26 DIAGNOSIS — E1122 Type 2 diabetes mellitus with diabetic chronic kidney disease: Secondary | ICD-10-CM | POA: Diagnosis not present

## 2024-01-26 DIAGNOSIS — T8249XA Other complication of vascular dialysis catheter, initial encounter: Secondary | ICD-10-CM | POA: Diagnosis not present

## 2024-01-26 DIAGNOSIS — Z23 Encounter for immunization: Secondary | ICD-10-CM | POA: Diagnosis not present

## 2024-01-26 DIAGNOSIS — N2581 Secondary hyperparathyroidism of renal origin: Secondary | ICD-10-CM | POA: Diagnosis not present

## 2024-01-26 DIAGNOSIS — N186 End stage renal disease: Secondary | ICD-10-CM | POA: Diagnosis not present

## 2024-01-26 DIAGNOSIS — D509 Iron deficiency anemia, unspecified: Secondary | ICD-10-CM | POA: Diagnosis not present

## 2024-01-26 DIAGNOSIS — Z992 Dependence on renal dialysis: Secondary | ICD-10-CM | POA: Diagnosis not present

## 2024-01-26 NOTE — Telephone Encounter (Signed)
I was able to speak with the pt and inform her from her Blood Type lab done 12/31/2021 which indicates pt blood type is "O" Positive

## 2024-01-28 DIAGNOSIS — N186 End stage renal disease: Secondary | ICD-10-CM | POA: Diagnosis not present

## 2024-01-28 DIAGNOSIS — Z992 Dependence on renal dialysis: Secondary | ICD-10-CM | POA: Diagnosis not present

## 2024-01-28 DIAGNOSIS — E1122 Type 2 diabetes mellitus with diabetic chronic kidney disease: Secondary | ICD-10-CM | POA: Diagnosis not present

## 2024-01-28 DIAGNOSIS — N2581 Secondary hyperparathyroidism of renal origin: Secondary | ICD-10-CM | POA: Diagnosis not present

## 2024-01-28 DIAGNOSIS — T8249XA Other complication of vascular dialysis catheter, initial encounter: Secondary | ICD-10-CM | POA: Diagnosis not present

## 2024-01-28 DIAGNOSIS — D509 Iron deficiency anemia, unspecified: Secondary | ICD-10-CM | POA: Diagnosis not present

## 2024-01-28 DIAGNOSIS — Z23 Encounter for immunization: Secondary | ICD-10-CM | POA: Diagnosis not present

## 2024-01-31 ENCOUNTER — Other Ambulatory Visit: Payer: Self-pay

## 2024-01-31 ENCOUNTER — Encounter (HOSPITAL_COMMUNITY): Payer: Self-pay | Admitting: Vascular Surgery

## 2024-01-31 DIAGNOSIS — T8249XA Other complication of vascular dialysis catheter, initial encounter: Secondary | ICD-10-CM | POA: Diagnosis not present

## 2024-01-31 DIAGNOSIS — N186 End stage renal disease: Secondary | ICD-10-CM | POA: Diagnosis not present

## 2024-01-31 DIAGNOSIS — Z23 Encounter for immunization: Secondary | ICD-10-CM | POA: Diagnosis not present

## 2024-01-31 DIAGNOSIS — D509 Iron deficiency anemia, unspecified: Secondary | ICD-10-CM | POA: Diagnosis not present

## 2024-01-31 DIAGNOSIS — N2581 Secondary hyperparathyroidism of renal origin: Secondary | ICD-10-CM | POA: Diagnosis not present

## 2024-01-31 DIAGNOSIS — Z992 Dependence on renal dialysis: Secondary | ICD-10-CM | POA: Diagnosis not present

## 2024-01-31 DIAGNOSIS — E1122 Type 2 diabetes mellitus with diabetic chronic kidney disease: Secondary | ICD-10-CM | POA: Diagnosis not present

## 2024-01-31 NOTE — Pre-Procedure Instructions (Signed)
-------------    SDW INSTRUCTIONS given:  Your procedure is scheduled on 2/26.  Report to [facility] Main Entrance "A" at 09:00 A.M., and check in at the Admitting office.  Any questions or running late day of surgery: call [phone]    Remember:  Do not eat or drink after midnight the night before your surgery     Take these medicines the morning of surgery with A SIP OF WATER  lipitor, bentyl PRN, cardizem, zetia, prilosec, percocet PRN    As of today, STOP taking any Aspirin (unless otherwise instructed by your surgeon) Aleve, Naproxen, Ibuprofen, Motrin, Advil, Goody's, BC's, all herbal medications, fish oil, and all vitamins.   Do NOT Smoke (Tobacco/Vaping) 24 hours prior to your procedure  If you use a CPAP at night, you may bring all equipment for your overnight stay.     You will be asked to remove any contacts, glasses, piercing's, hearing aid's, dentures/partials prior to surgery. Please bring cases for these items if needed.     Patients discharged the day of surgery will not be allowed to drive home, and someone needs to stay with them for 24 hours.  SURGICAL WAITING ROOM VISITATION Patients may have no more than 2 support people in the waiting area - these visitors may rotate.   Pre-op nurse will coordinate an appropriate time for 1 ADULT support person, who may not rotate, to accompany patient in pre-op.  Children under the age of 57 must have an adult with them who is not the patient and must remain in the main waiting area with an adult.  If the patient needs to stay at the hospital during part of their recovery, the visitor guidelines for inpatient rooms apply.  Please refer to the [facility] website for the visitor guidelines for any additional information.   Special instructions:   [facility]- Preparing For Surgery   Please follow these instructions carefully.   Shower the NIGHT BEFORE SURGERY and the MORNING OF SURGERY with DIAL Soap.   Pat  yourself dry with a CLEAN TOWEL.  Wear CLEAN PAJAMAS to bed the night before surgery  Place CLEAN SHEETS on your bed the night of your first shower and DO NOT SLEEP WITH PETS.   Additional instructions for the day of surgery: DO NOT APPLY any lotions, deodorants, cologne, or perfumes.   Do not wear jewelry or makeup Do not wear nail polish, gel polish, artificial nails, or any other type of covering on natural nails (fingers and toes) Do not bring valuables to the hospital. [facility] is not responsible for valuables/personal belongings. Put on clean/comfortable clothes.  Please brush your teeth.  Ask your nurse before applying any prescription medications to the skin.

## 2024-01-31 NOTE — Progress Notes (Signed)
 PCP - Dr. Edwina Barth  Cardiologist - Dr. Riley Lam  PPM/ICD - denies   Chest x-ray - 01/14/24 EKG - 06/17/23 Stress Test - 2007 ECHO - 04/19/23 Cardiac Cath - denies  CPAP - mild OSA, no CPAP  Fasting Blood Sugar - 180-200 Checks Blood Sugar once/day  ASA/Blood Thinner Instructions: n/a   ERAS Protcol - no, NPO  COVID TEST- n/a  Anesthesia review: yes, cardiac hx  Patient verbally denies any shortness of breath, fever, cough and chest pain during phone call      Questions were answered. Patient verbalized understanding of instructions.

## 2024-02-01 ENCOUNTER — Other Ambulatory Visit: Payer: Self-pay

## 2024-02-01 DIAGNOSIS — N186 End stage renal disease: Secondary | ICD-10-CM

## 2024-02-01 NOTE — Progress Notes (Signed)
 Anesthesia Chart Review: Same day workup  68 year old female follows with cardiology for history of HTN, questionable hypertrophic cardiomyopathy on recent echo.  Seen by Dr. Raynelle Jan on 06/30/2023.  Per note, "will increase to diltazem 360 mg PO XL - will schedule pharm D visit for HTN; I do not suspect oHCM given minimal LVOT gradient and chronic uncontrolled BP; given kidney I do not plan on CMR at this time - continue current diuretic; she has nephrology appt pending."  Patient did subsequently follow-up with Pharm.D. and was continued on diltiazem XL 360 mg daily.   History of uncontrolled IDDM 2, last A1c 8.9 on 08/16/2023.  Recent admission in July 2024 for HHS and questionable seizures.  She was evaluated by neurology, seizure activity felt secondary to hyperglycemia.  Per discharge summary, "Patient had no further seizures while her glucose was better controlled, no current indication for antiepileptic agents per neurology recommendations."   History of chronic hep C s/p successful treatment 2017 with elbasvir/grazoprevir .   ESRD with recent initiation of HD in September 2024.  Dialyzing Monday Wednesday Friday via right IJ TDC.   Patient will need day of surgery labs and evaluation.   EKG 06/17/2023: Sinus rhythm.  Rate 85. Borderline right axis deviation   TTE 04/19/2023:  1. Left ventricular ejection fraction, by estimation, is 70 to 75%. The  left ventricle has hyperdynamic function. The left ventricle has no  regional wall motion abnormalities. There is moderate concentric left  ventricular hypertrophy of the septal  segment. Left ventricular diastolic parameters are consistent with Grade I  diastolic dysfunction (impaired relaxation).   2. Right ventricular systolic function is hyperdynamic. The right  ventricular size is normal. There is normal pulmonary artery systolic  pressure.   3. The mitral valve is normal in structure. No evidence of mitral valve  regurgitation.    4. The aortic valve is tricuspid. Aortic valve regurgitation is not  visualized.   5. The inferior vena cava is normal in size with greater than 50%  respiratory variability, suggesting right atrial pressure of 3 mmHg.   Conclusion(s)/Recommendation(s): Findings consistent with hypertrophic  obstructive cardiomyopathy. Consider B-blocker and calcium channel blocker  like diltiazem as tolerated.     Zannie Cove [facility] Short Stay Center/Anesthesiology Phone [phone] 02/01/2024 3:49 PM

## 2024-02-01 NOTE — Progress Notes (Signed)
 Spoke with the pt, she will arrive tom at 0700. NPO post midnight.

## 2024-02-01 NOTE — Anesthesia Preprocedure Evaluation (Signed)
 Anesthesia Evaluation  Patient identified by MRN, date of birth, ID band Patient awake    Reviewed: Allergy & Precautions, NPO status , Patient's Chart, lab work & pertinent test results, reviewed documented beta blocker date and time   History of Anesthesia Complications Negative for: history of anesthetic complications  Airway Mallampati: III  TM Distance: >3 FB     Dental  (+) Partial Upper   Pulmonary sleep apnea    breath sounds clear to auscultation       Cardiovascular hypertension, +CHF   Rhythm:Regular Rate:Normal  HOCM with moderate LVOT gradient (20 to per last TTE)   Neuro/Psych neg Seizures  Neuromuscular disease    GI/Hepatic ,GERD  ,,(+) Hepatitis -, C  Endo/Other  diabetes, Type 2    Renal/GU CRFRenal disease     Musculoskeletal   Abdominal   Peds  Hematology  (+) Blood dyscrasia, anemia   Anesthesia Other Findings   Reproductive/Obstetrics                             Anesthesia Physical Anesthesia Plan  ASA: 3  Anesthesia Plan: MAC   Post-op Pain Management:    Induction: Intravenous  PONV Risk Score and Plan: 2 and Ondansetron and Dexamethasone  Airway Management Planned: Nasal Cannula and Natural Airway  Additional Equipment:   Intra-op Plan:   Post-operative Plan: Extubation in OR  Informed Consent: I have reviewed the patients History and Physical, chart, labs and discussed the procedure including the risks, benefits and alternatives for the proposed anesthesia with the patient or authorized representative who has indicated his/her understanding and acceptance.     Dental advisory given  Plan Discussed with:   Anesthesia Plan Comments: (PAT note by Antionette Poles, PA-C: 68 year old female follows with cardiology for history of HTN, questionable hypertrophic cardiomyopathy on recent echo.  Seen by Dr. Raynelle Jan on 06/30/2023.  Per note, "will  increase to diltazem 360 mg PO XL - will schedule pharm D visit for HTN; I do not suspect oHCM given minimal LVOT gradient and chronic uncontrolled BP; given kidney I do not plan on CMR at this time - continue current diuretic; she has nephrology appt pending."  Patient did subsequently follow-up with Pharm.D. and was continued on diltiazem XL 360 mg daily.  History of uncontrolled IDDM 2, last A1c 8.9 on 08/16/2023.  Recent admission in July 2024 for HHS and questionable seizures.  She was evaluated by neurology, seizure activity felt secondary to hyperglycemia.  Per discharge summary, "Patient had no further seizures while her glucose was better controlled, no current indication for antiepileptic agents per neurology recommendations."  History of chronic hep C s/p successful treatment 2017 with elbasvir/grazoprevir .  ESRD with recent initiation of HD in September 2024.  Dialyzing Monday Wednesday Friday via right IJ TDC.  Patient will need day of surgery labs and evaluation.  EKG 06/17/2023: Sinus rhythm.  Rate 85. Borderline right axis deviation  TTE 04/19/2023: 1. Left ventricular ejection fraction, by estimation, is 70 to 75%. The  left ventricle has hyperdynamic function. The left ventricle has no  regional wall motion abnormalities. There is moderate concentric left  ventricular hypertrophy of the septal  segment. Left ventricular diastolic parameters are consistent with Grade I  diastolic dysfunction (impaired relaxation).  2. Right ventricular systolic function is hyperdynamic. The right  ventricular size is normal. There is normal pulmonary artery systolic  pressure.  3. The mitral valve is normal in  structure. No evidence of mitral valve  regurgitation.  4. The aortic valve is tricuspid. Aortic valve regurgitation is not  visualized.  5. The inferior vena cava is normal in size with greater than 50%  respiratory variability, suggesting right atrial pressure of 3 mmHg.    Conclusion(s)/Recommendation(s): Findings consistent with hypertrophic  obstructive cardiomyopathy. Consider B-blocker and calcium channel blocker  like diltiazem as tolerated.    )        Anesthesia Quick Evaluation

## 2024-02-02 ENCOUNTER — Ambulatory Visit (HOSPITAL_BASED_OUTPATIENT_CLINIC_OR_DEPARTMENT_OTHER): Payer: 59 | Admitting: Physician Assistant

## 2024-02-02 ENCOUNTER — Other Ambulatory Visit: Payer: Self-pay

## 2024-02-02 ENCOUNTER — Ambulatory Visit (HOSPITAL_COMMUNITY): Payer: 59 | Admitting: Physician Assistant

## 2024-02-02 ENCOUNTER — Ambulatory Visit (HOSPITAL_COMMUNITY)
Admission: RE | Admit: 2024-02-02 | Discharge: 2024-02-02 | Disposition: A | Discharge status: Home / Self Care | Payer: 59 | Attending: Vascular Surgery | Admitting: Vascular Surgery

## 2024-02-02 ENCOUNTER — Ambulatory Visit (HOSPITAL_COMMUNITY): Payer: 59

## 2024-02-02 ENCOUNTER — Encounter (HOSPITAL_COMMUNITY): Payer: Self-pay | Admitting: Vascular Surgery

## 2024-02-02 ENCOUNTER — Other Ambulatory Visit (HOSPITAL_COMMUNITY): Payer: Self-pay

## 2024-02-02 ENCOUNTER — Encounter (HOSPITAL_COMMUNITY)
Admission: RE | Disposition: A | Discharge status: Home / Self Care | Payer: Self-pay | Source: Home / Self Care | Attending: Vascular Surgery

## 2024-02-02 DIAGNOSIS — I132 Hypertensive heart and chronic kidney disease with heart failure and with stage 5 chronic kidney disease, or end stage renal disease: Secondary | ICD-10-CM | POA: Diagnosis not present

## 2024-02-02 DIAGNOSIS — Z794 Long term (current) use of insulin: Secondary | ICD-10-CM | POA: Insufficient documentation

## 2024-02-02 DIAGNOSIS — E1122 Type 2 diabetes mellitus with diabetic chronic kidney disease: Secondary | ICD-10-CM | POA: Insufficient documentation

## 2024-02-02 DIAGNOSIS — B182 Chronic viral hepatitis C: Secondary | ICD-10-CM | POA: Diagnosis not present

## 2024-02-02 DIAGNOSIS — Z4901 Encounter for fitting and adjustment of extracorporeal dialysis catheter: Secondary | ICD-10-CM | POA: Insufficient documentation

## 2024-02-02 DIAGNOSIS — Z602 Problems related to living alone: Secondary | ICD-10-CM | POA: Insufficient documentation

## 2024-02-02 DIAGNOSIS — Z833 Family history of diabetes mellitus: Secondary | ICD-10-CM | POA: Diagnosis not present

## 2024-02-02 DIAGNOSIS — Z992 Dependence on renal dialysis: Secondary | ICD-10-CM

## 2024-02-02 DIAGNOSIS — Z8249 Family history of ischemic heart disease and other diseases of the circulatory system: Secondary | ICD-10-CM | POA: Insufficient documentation

## 2024-02-02 DIAGNOSIS — I421 Obstructive hypertrophic cardiomyopathy: Secondary | ICD-10-CM | POA: Diagnosis not present

## 2024-02-02 DIAGNOSIS — I130 Hypertensive heart and chronic kidney disease with heart failure and stage 1 through stage 4 chronic kidney disease, or unspecified chronic kidney disease: Secondary | ICD-10-CM | POA: Diagnosis not present

## 2024-02-02 DIAGNOSIS — T82898A Other specified complication of vascular prosthetic devices, implants and grafts, initial encounter: Secondary | ICD-10-CM | POA: Diagnosis not present

## 2024-02-02 DIAGNOSIS — I5032 Chronic diastolic (congestive) heart failure: Secondary | ICD-10-CM | POA: Diagnosis not present

## 2024-02-02 DIAGNOSIS — G4733 Obstructive sleep apnea (adult) (pediatric): Secondary | ICD-10-CM | POA: Diagnosis not present

## 2024-02-02 DIAGNOSIS — N186 End stage renal disease: Secondary | ICD-10-CM | POA: Insufficient documentation

## 2024-02-02 DIAGNOSIS — I509 Heart failure, unspecified: Secondary | ICD-10-CM | POA: Diagnosis not present

## 2024-02-02 DIAGNOSIS — Z79899 Other long term (current) drug therapy: Secondary | ICD-10-CM | POA: Insufficient documentation

## 2024-02-02 DIAGNOSIS — K219 Gastro-esophageal reflux disease without esophagitis: Secondary | ICD-10-CM | POA: Insufficient documentation

## 2024-02-02 HISTORY — PX: REMOVAL OF A DIALYSIS CATHETER: SHX6053

## 2024-02-02 LAB — POCT I-STAT, CHEM 8
BUN: 43 mg/dL — ABNORMAL HIGH (ref 8–23)
Calcium, Ion: 1.01 mmol/L — ABNORMAL LOW (ref 1.15–1.40)
Chloride: 100 mmol/L (ref 98–111)
Creatinine, Ser: 7.8 mg/dL — ABNORMAL HIGH (ref 0.44–1.00)
Glucose, Bld: 268 mg/dL — ABNORMAL HIGH (ref 70–99)
HCT: 34 % — ABNORMAL LOW (ref 36.0–46.0)
Hemoglobin: 11.6 g/dL — ABNORMAL LOW (ref 12.0–15.0)
Potassium: 3.5 mmol/L (ref 3.5–5.1)
Sodium: 137 mmol/L (ref 135–145)
TCO2: 25 mmol/L (ref 22–32)

## 2024-02-02 LAB — GLUCOSE, CAPILLARY
Glucose-Capillary: 252 mg/dL — ABNORMAL HIGH (ref 70–99)
Glucose-Capillary: 223 mg/dL — ABNORMAL HIGH (ref 70–99)
Glucose-Capillary: 172 mg/dL — ABNORMAL HIGH (ref 70–99)
Glucose-Capillary: 165 mg/dL — ABNORMAL HIGH (ref 70–99)

## 2024-02-02 SURGERY — REMOVAL, DIALYSIS CATHETER
Anesthesia: General | Site: Chest | Laterality: Right

## 2024-02-02 SURGERY — FISTULA SUPERFICIALIZATION
Anesthesia: Choice | Laterality: Left

## 2024-02-02 MED ORDER — SODIUM CHLORIDE 0.9% FLUSH: 3.0000 mL | INTRAVENOUS | Status: DC | PRN

## 2024-02-02 MED ORDER — MIDAZOLAM HCL 2 MG/2ML IJ SOLN
INTRAMUSCULAR | Status: DC | PRN
  Administered 2024-02-02: 2 mg via INTRAVENOUS

## 2024-02-02 MED ORDER — CHLORHEXIDINE GLUCONATE 4 % EX SOLN: 60.0000 mL | Freq: Once | CUTANEOUS | Status: DC

## 2024-02-02 MED ORDER — CHLORHEXIDINE GLUCONATE 0.12 % MT SOLN
OROMUCOSAL | Status: AC
  Administered 2024-02-02: 15 mL via OROMUCOSAL
  Filled 2024-02-02: qty 15

## 2024-02-02 MED ORDER — LIDOCAINE HCL 1 % IJ SOLN
INTRAMUSCULAR | Status: DC | PRN
  Administered 2024-02-02: 6 mL

## 2024-02-02 MED ORDER — OXYCODONE HCL 5 MG PO TABS
ORAL_TABLET | ORAL | Status: AC
  Administered 2024-02-02: 5 mg via ORAL
  Filled 2024-02-02: qty 1

## 2024-02-02 MED ORDER — CHLORHEXIDINE GLUCONATE 0.12 % MT SOLN: 15.0000 mL | Freq: Once | OROMUCOSAL | Status: AC

## 2024-02-02 MED ORDER — FENTANYL CITRATE (PF) 100 MCG/2ML IJ SOLN
INTRAMUSCULAR | Status: AC
Start: 2024-02-02 — End: 2024-02-02
  Administered 2024-02-02: 25 ug via INTRAVENOUS
  Filled 2024-02-02: qty 2

## 2024-02-02 MED ORDER — INSULIN ASPART 100 UNIT/ML IJ SOLN: 0.0000 [IU] | INTRAMUSCULAR | Status: DC | PRN

## 2024-02-02 MED ORDER — INSULIN ASPART 100 UNIT/ML IJ SOLN
INTRAMUSCULAR | Status: AC
Start: 2024-02-02 — End: 2024-02-02
  Administered 2024-02-02: 3 [IU] via SUBCUTANEOUS
  Filled 2024-02-02: qty 1

## 2024-02-02 MED ORDER — FENTANYL CITRATE (PF) 100 MCG/2ML IJ SOLN: 25.0000 ug | INTRAMUSCULAR | Status: DC | PRN

## 2024-02-02 MED ORDER — MIDAZOLAM HCL 2 MG/2ML IJ SOLN
INTRAMUSCULAR | Status: AC
  Filled 2024-02-02: qty 2

## 2024-02-02 MED ORDER — OXYCODONE-ACETAMINOPHEN 5-325 MG PO TABS
1.0000 | ORAL_TABLET | Freq: Four times a day (QID) | ORAL | 0 refills | Status: AC | PRN
  Filled 2024-02-02: qty 12, 3d supply, fill #0

## 2024-02-02 MED ORDER — ORAL CARE MOUTH RINSE: 15.0000 mL | Freq: Once | OROMUCOSAL | Status: AC

## 2024-02-02 MED ORDER — OXYCODONE HCL 5 MG PO TABS: 5.0000 mg | ORAL_TABLET | Freq: Once | ORAL | Status: AC | PRN

## 2024-02-02 MED ORDER — OXYCODONE HCL 5 MG/5ML PO SOLN: 5.0000 mg | Freq: Once | ORAL | Status: AC | PRN

## 2024-02-02 MED ORDER — HEPARIN SODIUM (PORCINE) 1000 UNIT/ML IJ SOLN
INTRAMUSCULAR | Status: DC | PRN
  Administered 2024-02-02: 3200 [IU]

## 2024-02-02 MED ORDER — SODIUM CHLORIDE 0.9% FLUSH: 3.0000 mL | Freq: Two times a day (BID) | INTRAVENOUS | Status: DC

## 2024-02-02 MED ORDER — HEPARIN SODIUM (PORCINE) 1000 UNIT/ML IJ SOLN
INTRAMUSCULAR | Status: AC
  Filled 2024-02-02: qty 10

## 2024-02-02 MED ORDER — PROPOFOL 10 MG/ML IV BOLUS
INTRAVENOUS | Status: DC | PRN
  Administered 2024-02-02: 20 mg via INTRAVENOUS
  Administered 2024-02-02: 30 mg via INTRAVENOUS
  Administered 2024-02-02: 20 mg via INTRAVENOUS

## 2024-02-02 MED ORDER — ACETAMINOPHEN 10 MG/ML IV SOLN: 1000.0000 mg | Freq: Once | INTRAVENOUS | Status: DC | PRN

## 2024-02-02 MED ORDER — 0.9 % SODIUM CHLORIDE (POUR BTL) OPTIME
TOPICAL | Status: DC | PRN
  Administered 2024-02-02: 1000 mL

## 2024-02-02 MED ORDER — SODIUM CHLORIDE 0.9 % IV SOLN: INTRAVENOUS | Status: DC | PRN

## 2024-02-02 MED ORDER — CEFAZOLIN SODIUM-DEXTROSE 2-4 GM/100ML-% IV SOLN: 2.0000 g | INTRAVENOUS | Status: DC

## 2024-02-02 MED ORDER — ONDANSETRON HCL 4 MG/2ML IJ SOLN: 4.0000 mg | Freq: Once | INTRAMUSCULAR | Status: DC | PRN

## 2024-02-02 MED ORDER — CEFAZOLIN SODIUM-DEXTROSE 2-4 GM/100ML-% IV SOLN
2.0000 g | INTRAVENOUS | Status: AC
  Administered 2024-02-02: 2 g via INTRAVENOUS
  Filled 2024-02-02: qty 100

## 2024-02-02 MED ORDER — HEPARIN 6000 UNIT IRRIGATION SOLUTION
Status: DC | PRN
  Administered 2024-02-02: 1

## 2024-02-02 MED ORDER — INSULIN ASPART 100 UNIT/ML IJ SOLN
4.0000 [IU] | Freq: Once | INTRAMUSCULAR | Status: AC
  Administered 2024-02-02: 4 [IU] via SUBCUTANEOUS

## 2024-02-02 SURGICAL SUPPLY — 38 items
BAG COUNTER SPONGE SURGICOUNT (BAG) ×2 IMPLANT
BAG DECANTER FOR FLEXI CONT (MISCELLANEOUS) ×2 IMPLANT
BIOPATCH RED 1 DISK 7.0 (GAUZE/BANDAGES/DRESSINGS) ×2 IMPLANT
CATH PALINDROME-P 19CM W/VT (CATHETERS) ×1 IMPLANT
CATH PALINDROME-P 23CM W/VT (CATHETERS) IMPLANT
CATH PALINDROME-P 28CM W/VT (CATHETERS) IMPLANT
COVER PROBE W GEL 5X96 (DRAPES) ×2 IMPLANT
COVER SURGICAL LIGHT HANDLE (MISCELLANEOUS) ×2 IMPLANT
DERMABOND ADVANCED .7 DNX12 (GAUZE/BANDAGES/DRESSINGS) ×2 IMPLANT
DRAPE C-ARM 42X72 X-RAY (DRAPES) ×2 IMPLANT
DRAPE CHEST BREAST 15X10 FENES (DRAPES) ×2 IMPLANT
GAUZE 4X4 16PLY ~~LOC~~+RFID DBL (SPONGE) ×2 IMPLANT
GLIDEWIRE ADV .035X180CM (WIRE) ×1 IMPLANT
GLOVE BIO SURGEON STRL SZ7.5 (GLOVE) ×2 IMPLANT
GLOVE BIOGEL PI IND STRL 8 (GLOVE) ×2 IMPLANT
GOWN STRL REUS W/ TWL LRG LVL3 (GOWN DISPOSABLE) ×4 IMPLANT
GOWN STRL REUS W/ TWL XL LVL3 (GOWN DISPOSABLE) ×4 IMPLANT
KIT BASIN OR (CUSTOM PROCEDURE TRAY) ×2 IMPLANT
KIT PALINDROME-P 55CM (CATHETERS) IMPLANT
KIT TURNOVER KIT B (KITS) ×2 IMPLANT
NDL 18GX1X1/2 (RX/OR ONLY) (NEEDLE) ×1 IMPLANT
NDL HYPO 25GX1X1/2 BEV (NEEDLE) ×1 IMPLANT
NEEDLE 18GX1X1/2 (RX/OR ONLY) (NEEDLE) ×2 IMPLANT
NEEDLE HYPO 25GX1X1/2 BEV (NEEDLE) ×2 IMPLANT
NS IRRIG 1000ML POUR BTL (IV SOLUTION) ×2 IMPLANT
PACK SRG BSC III STRL LF ECLPS (CUSTOM PROCEDURE TRAY) ×2 IMPLANT
PAD ARMBOARD 7.5X6 YLW CONV (MISCELLANEOUS) ×4 IMPLANT
SPIKE FLUID TRANSFER (MISCELLANEOUS) ×2 IMPLANT
SUT ETHILON 3 0 PS 1 (SUTURE) ×2 IMPLANT
SUT MNCRL AB 4-0 PS2 18 (SUTURE) ×2 IMPLANT
SYR 10ML LL (SYRINGE) ×2 IMPLANT
SYR 20ML LL LF (SYRINGE) ×4 IMPLANT
SYR 5ML LL (SYRINGE) ×2 IMPLANT
SYR CONTROL 10ML LL (SYRINGE) ×2 IMPLANT
TOWEL GREEN STERILE (TOWEL DISPOSABLE) ×2 IMPLANT
TOWEL GREEN STERILE FF (TOWEL DISPOSABLE) ×2 IMPLANT
WATER STERILE IRR 1000ML POUR (IV SOLUTION) ×2 IMPLANT
WIRE AMPLATZ SS-J .035X180CM (WIRE) IMPLANT

## 2024-02-02 NOTE — Discharge Instructions (Signed)
 Keep catheter clean and dry

## 2024-02-02 NOTE — Op Note (Signed)
 Date: February 02, 2024  Preoperative diagnosis: End-stage renal disease with dislodged tunneled dialysis catheter  Postoperative diagnosis: Same  Procedure: 1.  Exchange of right internal jugular vein tunneled dialysis catheter under fluoroscopy with a new 19 cm tunneled palindrome catheter  Surgeon: Dr. [name], MD  Assistant: OR staff  Indications: 68 year old [name] with end-stage renal disease that presents for tunneled dialysis catheter exchange after her catheter was pulled back leaving the cuff nearly exposed.  She presents after risks benefits discussed.  Findings: The right IJ catheter was prepped in the field.  I advanced a glidewire advantage through the existing catheter with the wire down the IVC and I was able to remove the existing catheter and placed a new 19 cm palindrome catheter with the tip in the right atrium.  This flushed and aspirated easily.  Anesthesia: MAC  Details: Patient was taken to the operating room after informed consent was obtained.  Placed on the operative table in the supine position.  After anesthesia was induced the existing right IJ tunneled dialysis catheter was prepped in the field including the right chest wall and neck.  Timeout was performed and antibiotics were given.  I did use 10 mL of 1% lidocaine without epinephrine and anesthetized the track of her existing catheter.  I then placed a Glidewire advantage down the tunneled dialysis catheter into the IVC under fluoroscopy.  I was able to free up the cuff and this old catheter was completely removed over the wire and passed off the field.  I selected a new 19 cm palindrome catheter that was placed over the existing wire with the tip placed into the right atrium making sure the cuff was under the skin.  This flushed and aspirated easily.  It was not kinked.  I then secured this with multiple 3-0 nylon sutures.  Flushed and aspirated easily.  It was loaded with heparin according to  manufactures recommendations.  Dermabond was placed around the insertion site.  Complication: None  Condition: Stable  [name], MD [facility] Office: [phone]   [name]

## 2024-02-02 NOTE — Transfer of Care (Signed)
 Immediate Anesthesia Transfer of Care Note  Patient: Nancy [name]  Procedure(s) Performed: DIALYSIS CATHETER EXCHANGE IN RIGHT INTERNAL JUGULAR (Right: Chest)  Patient Location: PACU  Anesthesia Type:MAC  Level of Consciousness: awake, alert , and oriented  Airway & Oxygen Therapy: Patient Spontanous Breathing  Post-op Assessment: Report given to RN and Post -op Vital signs reviewed and stable  Post vital signs: Reviewed and stable  Last Vitals:  Vitals Value Taken Time  BP 148/74 02/02/24 1122  Temp    Pulse 55 02/02/24 1125  Resp 18 02/02/24 1125  SpO2 98 % 02/02/24 1125  Vitals shown include unfiled device data.  Last Pain:  Vitals:   02/02/24 0830  PainSc: 0-No pain         Complications: No notable events documented.

## 2024-02-02 NOTE — H&P (Signed)
 H&P    Reason for Consult: Exchange [facility] MRN #:  [id number]  History of Present Illness: This is a 68 y.o. female with ESRD that presents for [facility] catheter exchange.  She states her catheter got pulled back when she was alerted by dialysis nurses.  She is well-known to vascular surgery and just had her left arm brachiocephalic fistula revised with sidebranch ligation and superficialization on 01/10/2024.  Past Medical History:  Diagnosis Date   Carpal tunnel syndrome, bilateral    confirmed on nerve conduction studies   Chronic hepatitis C (HCC)    considered cured 06/2016 post retroviral therapy    Chronic kidney disease    dialysis M-W-F   Diabetes mellitus type 2, uncontrolled    Diabetic peripheral neuropathy (HCC)    GERD (gastroesophageal reflux disease)    History of endometriosis    History of Helicobacter infection 10/2006   Hyperlipidemia    Hypertension    Liver fibrosis 02/17/2016   F2/3    Sleep apnea 09/2018   Results showed very mild OSA - no CPAP    Past Surgical History:  Procedure Laterality Date   ABDOMINAL HYSTERECTOMY     AV FISTULA PLACEMENT Left 10/22/2023   Procedure: LEFT ARM BRACHIOCEPHALIC ARTERIOVENOUS (AV) FISTULA CREATION;  Surgeon: [name], MD;  Location: [facility] OR;  Service: Vascular;  Laterality: Left;   CARPAL TUNNEL RELEASE Bilateral    COLONOSCOPY     EYE SURGERY Bilateral    cataracts removed   FISTULA SUPERFICIALIZATION Left 01/10/2024   Procedure: LEFT ARM FISTULA SUPERFICIALIZATION;  Surgeon: [name], MD;  Location: [facility] OR;  Service: Vascular;  Laterality: Left;   LIGATION OF COMPETING BRANCHES OF ARTERIOVENOUS FISTULA Left 01/10/2024   Procedure: REVISION OF ARTERIOVENOUS FISTULA WITH SIDE BRANCH LIGATION;  Surgeon: [name], MD;  Location: [facility] OR;  Service: Vascular;  Laterality: Left;   UPPER GI ENDOSCOPY      Allergies  Allergen Reactions   Ace Inhibitors Other (See Comments) and Cough    Persistent  dry cough   Amitriptyline Hcl Nausea And Vomiting and Other (See Comments)    GI Intolerance/Upset stomach   Aspirin Other (See Comments)    GI Intolerance   Latex Itching   Prednisone Nausea And Vomiting    Prior to Admission medications   Medication Sig Start Date End Date Taking? Authorizing Provider  ACCU-CHEK FASTCLIX LANCETS MISC Use to check blood sugar up to 3 times a day 10/11/18   Eulah Pont, MD  ACCU-CHEK GUIDE test strip CHECK BLOOD SUGAR 3 TIMES A DAY 03/30/23   Georgina Quint, MD  atorvastatin (LIPITOR) 40 MG tablet Take 1 tablet (40 mg total) by mouth daily. 12/16/23   Georgina Quint, MD  Blood Glucose Monitoring Suppl (ACCU-CHEK GUIDE) w/Device KIT 1 each by Does not apply route 3 (three) times daily. Check blood sugar 3 times a day 06/19/23   Arrien, York Ram, MD  Blood Pressure Monitoring (ADVANCED ONE STEP BP MONITOR) MISC 1 Units by Does not apply route daily. 07/26/23   Christell Constant, MD  Cholecalciferol (VITAMIN D3) 25 MCG (1000 UT) CAPS Take 1,000 Units by mouth daily.    [provider]  cyclobenzaprine (FLEXERIL) 10 MG tablet TAKE 1 TABLET BY MOUTH EVERYDAY AT BEDTIME 07/12/23   Corwin Levins, MD  dicyclomine (BENTYL) 20 MG tablet Take 1 tablet (20 mg total) by mouth every 6 (six) hours as needed for spasms. 11/23/23   Georgina Quint,  MD  diltiazem (CARDIZEM CD) 240 MG 24 hr capsule Take 240 mg by mouth daily. 12/27/23   [provider]  ezetimibe (ZETIA) 10 MG tablet Take 10 mg by mouth daily.    [provider]  Glucagon (GVOKE HYPOPEN 2-PACK) 0.5 MG/0.1ML SOAJ Inject 0.5 mg into the skin as needed. 08/17/23   Georgina Quint, MD  insulin glargine (LANTUS) 100 UNIT/ML injection Inject 22 Units into the skin daily.    [provider]  insulin lispro (HUMALOG) 100 UNIT/ML KwikPen For sugar 120 to 150 use one unit, for 151 to 200 use two units, for 201 to 250 use three units, for 251 to 300 use five  units, for 301 to 350 use seven units, for 351 to 400 use 9 units, if sugar more than 400 call your primary care provider. 06/19/23   Arrien, York Ram, MD  Insulin Pen Needle (PEN NEEDLES) 31G X 5 MM MISC 1 each by Does not apply route 3 (three) times daily with meals. May substitute to any manufacturer covered by patient's insurance. 06/19/23   Arrien, York Ram, MD  Insulin Syringe-Needle U-100 31G X 15/64" 0.5 ML MISC Use to inject insulin one time a day 06/19/23   Arrien, York Ram, MD  Insulin Syringes, Disposable, U-100 1 ML MISC 2 Syringes by Does not apply route daily. Use to inject insulin into the skin 1 time daily. diag code E11.9. Insulin dependent 06/19/23   Arrien, York Ram, MD  omeprazole (PRILOSEC) 40 MG capsule TAKE 1 CAPSULE BY MOUTH EVERY DAY 03/30/23   Georgina Quint, MD  oxyCODONE-acetaminophen (PERCOCET) 5-325 MG tablet Take 1 tablet by mouth every 6 (six) hours as needed for severe pain (pain score 7-10). 01/10/24   Rhyne, Ames Coupe, PA-C  senna (SENOKOT) 8.6 MG tablet Take 1 tablet (8.6 mg total) by mouth as needed for constipation. 07/08/20   Briscoe Burns, MD    Social History   Socioeconomic History   Marital status: Single    Spouse name: Not on file   Number of children: 0   Years of education: 12   Highest education level: Not on file  Occupational History   Occupation: CNA    Employer: [employer]   Occupation: Disabled  Tobacco Use   Smoking status: Never   Smokeless tobacco: Never  Vaping Use   Vaping status: Never Used  Substance and Sexual Activity   Alcohol use: No    Alcohol/week: 0.0 standard drinks of alcohol   Drug use: No   Sexual activity: Yes    Birth control/protection: Surgical    Comment: Hysterectomy  Other Topics Concern   Not on file  Social History Narrative   Current Social History 08/28/2019        Patient lives alone in a 7th floor apartment with elevator. There are not steps up to the entrance the  patient uses.       Patient's method of transportation is via family member (mom or friend).      The highest level of education was high school diploma.      The patient currently disabled.      Identified important Relationships are "My mother" - Patient is caregiver for Mother      Pets : None       Interests / Fun: "TV, movies"       Receiving HD on Monday, Wednesday, Friday      Current Stressors: "People, my health"  Religious / Personal Beliefs: "Baptist"       L. Leward Quan, RN, BSN       Social Drivers of Health   Financial Resource Strain: Low Risk  (04/24/2022)   Overall Financial Resource Strain (CARDIA)    Difficulty of Paying Living Expenses: Not hard at all  Food Insecurity: No Food Insecurity (01/06/2024)   Hunger Vital Sign    Worried About Running Out of Food in the Last Year: Never true    Ran Out of Food in the Last Year: Never true  Transportation Needs: No Transportation Needs (01/06/2024)   PRAPARE - Administrator, Civil Service (Medical): No    Lack of Transportation (Non-Medical): No  Physical Activity: Not on file  Stress: No Stress Concern Present (04/24/2022)   [employer] of Occupational Health - Occupational Stress Questionnaire    Feeling of Stress : Not at all  Social Connections: Socially Isolated (04/24/2022)   Social Connection and Isolation Panel [NHANES]    Frequency of Communication with Friends and Family: Three times a week    Frequency of Social Gatherings with Friends and Family: Three times a week    Attends Religious Services: Never    Active Member of Clubs or Organizations: No    Attends Banker Meetings: Never    Marital Status: Divorced  Catering manager Violence: Not At Risk (01/06/2024)   Humiliation, Afraid, Rape, and Kick questionnaire    Fear of Current or Ex-Partner: No    Emotionally Abused: No    Physically Abused: No    Sexually Abused: No     Family History  Problem Relation  Age of Onset   Cancer Mother        lung   Kidney disease Mother    Lung cancer Maternal Uncle    Breast cancer Maternal Grandmother    Liver cancer Maternal Grandfather    Heart disease Brother    Depression Brother    Diabetes Father    Dementia Father    Colon cancer Neg Hx     ROS: [x]  Positive   [ ]  Negative   [ ]  All sytems reviewed and are negative  Cardiovascular: []  chest pain/pressure []  palpitations []  SOB lying flat []  DOE []  pain in legs while walking []  pain in legs at rest []  pain in legs at night []  non-healing ulcers []  hx of DVT []  swelling in legs  Pulmonary: []  productive cough []  asthma/wheezing []  home O2  Neurologic: []  weakness in []  arms []  legs []  numbness in []  arms []  legs []  hx of CVA []  mini stroke [] difficulty speaking or slurred speech []  temporary loss of vision in one eye []  dizziness  Hematologic: []  hx of cancer []  bleeding problems []  problems with blood clotting easily  Endocrine:   []  diabetes []  thyroid disease  GI []  vomiting blood []  blood in stool  GU: []  CKD/renal failure []  HD--[]  M/W/F or []  T/T/S []  burning with urination []  blood in urine  Psychiatric: []  anxiety []  depression  Musculoskeletal: []  arthritis []  joint pain  Integumentary: []  rashes []  ulcers  Constitutional: []  fever []  chills   Physical Examination  Vitals:   02/02/24 0716  BP: (!) 165/72  Pulse: (!) 55  Resp: 18  Temp: 97.9 F (36.6 C)  SpO2: 100%   Body mass index is 30.31 kg/m.  General:  WDWN in NAD Gait: Not observed HENT: WNL, normocephalic Pulmonary: normal non-labored breathing Cardiac: regular, without  Murmurs,  rubs or gallops Abdomen:  soft, NT/ND Vascular Exam/Pulses: Right IJ TDC Left brachiocephalic fistula with good thrill and incisions well-healed Extremities: without ischemic changes Musculoskeletal: no muscle wasting or atrophy  Neurologic: A&O X 3; Appropriate Affect ; SENSATION: normal;  MOTOR FUNCTION:  moving all extremities equally. Speech is fluent/normal  CBC    Component Value Date/Time   WBC 9.3 06/18/2023 0805   RBC 3.83 (L) 06/18/2023 0805   HGB 11.6 (L) 02/02/2024 0754   HGB 11.9 07/12/2018 1502   HCT 34.0 (L) 02/02/2024 0754   HCT 35.5 07/12/2018 1502   PLT 239 06/18/2023 0805   PLT 344 07/12/2018 1502   MCV 77.8 (L) 06/18/2023 0805   MCV 80 07/12/2018 1502   MCH 26.9 06/18/2023 0805   MCHC 34.6 06/18/2023 0805   RDW 13.0 06/18/2023 0805   RDW 14.9 07/12/2018 1502   LYMPHSABS 2.5 06/17/2023 1423   MONOABS 0.6 06/17/2023 1423   EOSABS 0.1 06/17/2023 1423   BASOSABS 0.1 06/17/2023 1423    BMET    Component Value Date/Time   NA 137 02/02/2024 0754   NA 139 07/26/2023 1132   K 3.5 02/02/2024 0754   CL 100 02/02/2024 0754   CO2 17 (L) 07/26/2023 1132   GLUCOSE 268 (H) 02/02/2024 0754   BUN 43 (H) 02/02/2024 0754   BUN 59 (H) 07/26/2023 1132   CREATININE 7.80 (H) 02/02/2024 0754   CREATININE 1.31 (H) 07/18/2019 1246   CREATININE 0.77 03/06/2015 1130   CALCIUM 8.2 (L) 07/26/2023 1132   GFRNONAA 7 (L) 06/19/2023 0036   GFRNONAA 44 (L) 07/18/2019 1246   GFRNONAA 86 03/06/2015 1130   GFRAA 33 (L) 11/07/2020 1142   GFRAA 51 (L) 07/18/2019 1246   GFRAA >89 03/06/2015 1130    COAGS: Lab Results  Component Value Date   INR 1.0 11/28/2019     Non-Invasive Vascular Imaging:    N/A  ASSESSMENT/PLAN: This is a 68 y.o. female with ESRD that presents for [facility] catheter exchange.  She states her catheter got pulled back when she was alerted by dialysis nurses.  Discussed plan for exchange of TDC today.  I discussed next week when she is 1 month status post superficialization she can start using her left arm AV fistula.  This has a great thrill.  [name], MD [facility] [place] Office: [phone]  [name]

## 2024-02-02 NOTE — Anesthesia Postprocedure Evaluation (Signed)
 Anesthesia Post Note  Patient: Nancy [name]  Procedure(s) Performed: DIALYSIS CATHETER EXCHANGE IN RIGHT INTERNAL JUGULAR (Right: Chest)     Patient location during evaluation: PACU Anesthesia Type: MAC Level of consciousness: awake and alert Pain management: pain level controlled Vital Signs Assessment: post-procedure vital signs reviewed and stable Respiratory status: spontaneous breathing, nonlabored ventilation, respiratory function stable and patient connected to nasal cannula oxygen Cardiovascular status: stable and blood pressure returned to baseline Postop Assessment: no apparent nausea or vomiting Anesthetic complications: no   No notable events documented.  Last Vitals:  Vitals:   02/02/24 1130 02/02/24 1200  BP: (!) 127/113   Pulse: (!) 55   Resp: 18   Temp:  (!) 36.1 C  SpO2: 100%     Last Pain:  Vitals:   02/02/24 1145  PainSc: 7                  Mariann Barter

## 2024-02-03 ENCOUNTER — Encounter (HOSPITAL_COMMUNITY): Payer: Self-pay | Admitting: Vascular Surgery

## 2024-02-04 DIAGNOSIS — Z23 Encounter for immunization: Secondary | ICD-10-CM | POA: Diagnosis not present

## 2024-02-04 DIAGNOSIS — D509 Iron deficiency anemia, unspecified: Secondary | ICD-10-CM | POA: Diagnosis not present

## 2024-02-04 DIAGNOSIS — Z992 Dependence on renal dialysis: Secondary | ICD-10-CM | POA: Diagnosis not present

## 2024-02-04 DIAGNOSIS — N2581 Secondary hyperparathyroidism of renal origin: Secondary | ICD-10-CM | POA: Diagnosis not present

## 2024-02-04 DIAGNOSIS — T8249XA Other complication of vascular dialysis catheter, initial encounter: Secondary | ICD-10-CM | POA: Diagnosis not present

## 2024-02-04 DIAGNOSIS — N186 End stage renal disease: Secondary | ICD-10-CM | POA: Diagnosis not present

## 2024-02-04 DIAGNOSIS — E1122 Type 2 diabetes mellitus with diabetic chronic kidney disease: Secondary | ICD-10-CM | POA: Diagnosis not present

## 2024-02-07 DIAGNOSIS — E1122 Type 2 diabetes mellitus with diabetic chronic kidney disease: Secondary | ICD-10-CM | POA: Diagnosis not present

## 2024-02-07 DIAGNOSIS — Z992 Dependence on renal dialysis: Secondary | ICD-10-CM | POA: Diagnosis not present

## 2024-02-07 DIAGNOSIS — Z23 Encounter for immunization: Secondary | ICD-10-CM | POA: Diagnosis not present

## 2024-02-07 DIAGNOSIS — N186 End stage renal disease: Secondary | ICD-10-CM | POA: Diagnosis not present

## 2024-02-07 DIAGNOSIS — D509 Iron deficiency anemia, unspecified: Secondary | ICD-10-CM | POA: Diagnosis not present

## 2024-02-07 DIAGNOSIS — D631 Anemia in chronic kidney disease: Secondary | ICD-10-CM | POA: Diagnosis not present

## 2024-02-07 DIAGNOSIS — N2581 Secondary hyperparathyroidism of renal origin: Secondary | ICD-10-CM | POA: Diagnosis not present

## 2024-02-07 DIAGNOSIS — T8249XA Other complication of vascular dialysis catheter, initial encounter: Secondary | ICD-10-CM | POA: Diagnosis not present

## 2024-02-09 DIAGNOSIS — N186 End stage renal disease: Secondary | ICD-10-CM | POA: Diagnosis not present

## 2024-02-09 DIAGNOSIS — T8249XA Other complication of vascular dialysis catheter, initial encounter: Secondary | ICD-10-CM | POA: Diagnosis not present

## 2024-02-09 DIAGNOSIS — N2581 Secondary hyperparathyroidism of renal origin: Secondary | ICD-10-CM | POA: Diagnosis not present

## 2024-02-09 DIAGNOSIS — D631 Anemia in chronic kidney disease: Secondary | ICD-10-CM | POA: Diagnosis not present

## 2024-02-09 DIAGNOSIS — Z992 Dependence on renal dialysis: Secondary | ICD-10-CM | POA: Diagnosis not present

## 2024-02-09 DIAGNOSIS — E1122 Type 2 diabetes mellitus with diabetic chronic kidney disease: Secondary | ICD-10-CM | POA: Diagnosis not present

## 2024-02-09 DIAGNOSIS — D509 Iron deficiency anemia, unspecified: Secondary | ICD-10-CM | POA: Diagnosis not present

## 2024-02-09 DIAGNOSIS — Z23 Encounter for immunization: Secondary | ICD-10-CM | POA: Diagnosis not present

## 2024-02-10 ENCOUNTER — Telehealth: Payer: Self-pay

## 2024-02-10 NOTE — Patient Outreach (Signed)
 Care Coordination   02/10/2024 Name: Nancy [name] MRN: [id number] DOB: 24-Jan-1956   Care Coordination Outreach Attempts:  An unsuccessful outreach was attempted for an appointment today.  Follow Up Plan:  Additional outreach attempts will be made to offer the patient complex care management information and services.   Encounter Outcome:  No Answer   Care Coordination Interventions:  No, not indicated    [name], RN, MSN, BSN, CCM [place]  [facility], Population Health Case Manager Phone: [phone]

## 2024-02-11 DIAGNOSIS — Z23 Encounter for immunization: Secondary | ICD-10-CM | POA: Diagnosis not present

## 2024-02-11 DIAGNOSIS — N2581 Secondary hyperparathyroidism of renal origin: Secondary | ICD-10-CM | POA: Diagnosis not present

## 2024-02-11 DIAGNOSIS — Z992 Dependence on renal dialysis: Secondary | ICD-10-CM | POA: Diagnosis not present

## 2024-02-11 DIAGNOSIS — N186 End stage renal disease: Secondary | ICD-10-CM | POA: Diagnosis not present

## 2024-02-11 DIAGNOSIS — D631 Anemia in chronic kidney disease: Secondary | ICD-10-CM | POA: Diagnosis not present

## 2024-02-11 DIAGNOSIS — D509 Iron deficiency anemia, unspecified: Secondary | ICD-10-CM | POA: Diagnosis not present

## 2024-02-11 DIAGNOSIS — E1122 Type 2 diabetes mellitus with diabetic chronic kidney disease: Secondary | ICD-10-CM | POA: Diagnosis not present

## 2024-02-11 DIAGNOSIS — T8249XA Other complication of vascular dialysis catheter, initial encounter: Secondary | ICD-10-CM | POA: Diagnosis not present

## 2024-02-14 DIAGNOSIS — T8249XA Other complication of vascular dialysis catheter, initial encounter: Secondary | ICD-10-CM | POA: Diagnosis not present

## 2024-02-14 DIAGNOSIS — N2581 Secondary hyperparathyroidism of renal origin: Secondary | ICD-10-CM | POA: Diagnosis not present

## 2024-02-14 DIAGNOSIS — D509 Iron deficiency anemia, unspecified: Secondary | ICD-10-CM | POA: Diagnosis not present

## 2024-02-14 DIAGNOSIS — E1122 Type 2 diabetes mellitus with diabetic chronic kidney disease: Secondary | ICD-10-CM | POA: Diagnosis not present

## 2024-02-14 DIAGNOSIS — Z992 Dependence on renal dialysis: Secondary | ICD-10-CM | POA: Diagnosis not present

## 2024-02-14 DIAGNOSIS — D631 Anemia in chronic kidney disease: Secondary | ICD-10-CM | POA: Diagnosis not present

## 2024-02-14 DIAGNOSIS — Z23 Encounter for immunization: Secondary | ICD-10-CM | POA: Diagnosis not present

## 2024-02-14 DIAGNOSIS — N186 End stage renal disease: Secondary | ICD-10-CM | POA: Diagnosis not present

## 2024-02-16 DIAGNOSIS — Z23 Encounter for immunization: Secondary | ICD-10-CM | POA: Diagnosis not present

## 2024-02-16 DIAGNOSIS — T8249XA Other complication of vascular dialysis catheter, initial encounter: Secondary | ICD-10-CM | POA: Diagnosis not present

## 2024-02-16 DIAGNOSIS — D509 Iron deficiency anemia, unspecified: Secondary | ICD-10-CM | POA: Diagnosis not present

## 2024-02-16 DIAGNOSIS — N2581 Secondary hyperparathyroidism of renal origin: Secondary | ICD-10-CM | POA: Diagnosis not present

## 2024-02-16 DIAGNOSIS — E1122 Type 2 diabetes mellitus with diabetic chronic kidney disease: Secondary | ICD-10-CM | POA: Diagnosis not present

## 2024-02-16 DIAGNOSIS — N186 End stage renal disease: Secondary | ICD-10-CM | POA: Diagnosis not present

## 2024-02-16 DIAGNOSIS — Z992 Dependence on renal dialysis: Secondary | ICD-10-CM | POA: Diagnosis not present

## 2024-02-16 DIAGNOSIS — D631 Anemia in chronic kidney disease: Secondary | ICD-10-CM | POA: Diagnosis not present

## 2024-02-18 DIAGNOSIS — Z992 Dependence on renal dialysis: Secondary | ICD-10-CM | POA: Diagnosis not present

## 2024-02-18 DIAGNOSIS — D509 Iron deficiency anemia, unspecified: Secondary | ICD-10-CM | POA: Diagnosis not present

## 2024-02-18 DIAGNOSIS — T8249XA Other complication of vascular dialysis catheter, initial encounter: Secondary | ICD-10-CM | POA: Diagnosis not present

## 2024-02-18 DIAGNOSIS — Z23 Encounter for immunization: Secondary | ICD-10-CM | POA: Diagnosis not present

## 2024-02-18 DIAGNOSIS — N186 End stage renal disease: Secondary | ICD-10-CM | POA: Diagnosis not present

## 2024-02-18 DIAGNOSIS — E1122 Type 2 diabetes mellitus with diabetic chronic kidney disease: Secondary | ICD-10-CM | POA: Diagnosis not present

## 2024-02-18 DIAGNOSIS — N2581 Secondary hyperparathyroidism of renal origin: Secondary | ICD-10-CM | POA: Diagnosis not present

## 2024-02-18 DIAGNOSIS — D631 Anemia in chronic kidney disease: Secondary | ICD-10-CM | POA: Diagnosis not present

## 2024-02-21 DIAGNOSIS — N186 End stage renal disease: Secondary | ICD-10-CM | POA: Diagnosis not present

## 2024-02-21 DIAGNOSIS — E1122 Type 2 diabetes mellitus with diabetic chronic kidney disease: Secondary | ICD-10-CM | POA: Diagnosis not present

## 2024-02-21 DIAGNOSIS — D509 Iron deficiency anemia, unspecified: Secondary | ICD-10-CM | POA: Diagnosis not present

## 2024-02-21 DIAGNOSIS — D631 Anemia in chronic kidney disease: Secondary | ICD-10-CM | POA: Diagnosis not present

## 2024-02-21 DIAGNOSIS — T8249XA Other complication of vascular dialysis catheter, initial encounter: Secondary | ICD-10-CM | POA: Diagnosis not present

## 2024-02-21 DIAGNOSIS — Z992 Dependence on renal dialysis: Secondary | ICD-10-CM | POA: Diagnosis not present

## 2024-02-21 DIAGNOSIS — Z23 Encounter for immunization: Secondary | ICD-10-CM | POA: Diagnosis not present

## 2024-02-21 DIAGNOSIS — N2581 Secondary hyperparathyroidism of renal origin: Secondary | ICD-10-CM | POA: Diagnosis not present

## 2024-02-22 ENCOUNTER — Ambulatory Visit (INDEPENDENT_AMBULATORY_CARE_PROVIDER_SITE_OTHER): Admitting: Physician Assistant

## 2024-02-22 VITALS — BP 163/81 | Temp 98.1°F | Resp 18 | Ht <= 58 in | Wt 142.0 lb

## 2024-02-22 DIAGNOSIS — Z992 Dependence on renal dialysis: Secondary | ICD-10-CM

## 2024-02-22 DIAGNOSIS — N186 End stage renal disease: Secondary | ICD-10-CM

## 2024-02-22 NOTE — Progress Notes (Unsigned)
 POST OPERATIVE OFFICE NOTE    CC:  F/u for surgery  HPI:  This is a 68 y.o. female who is s/p *** on *** by Dr. Marland Kitchen    Pt returns today for follow up.  Pt states ***   Allergies  Allergen Reactions   Ace Inhibitors Other (See Comments) and Cough    Persistent dry cough   Amitriptyline Hcl Nausea And Vomiting and Other (See Comments)    GI Intolerance/Upset stomach   Aspirin Other (See Comments)    GI Intolerance   Latex Itching   Prednisone Nausea And Vomiting    Current Outpatient Medications  Medication Sig Dispense Refill   ACCU-CHEK FASTCLIX LANCETS MISC Use to check blood sugar up to 3 times a day 306 each 3   ACCU-CHEK GUIDE test strip CHECK BLOOD SUGAR 3 TIMES A DAY 300 strip 3   atorvastatin (LIPITOR) 40 MG tablet Take 1 tablet (40 mg total) by mouth daily. 90 tablet 3   Blood Glucose Monitoring Suppl (ACCU-CHEK GUIDE) w/Device KIT 1 each by Does not apply route 3 (three) times daily. Check blood sugar 3 times a day 1 kit 0   Blood Pressure Monitoring (ADVANCED ONE STEP BP MONITOR) MISC 1 Units by Does not apply route daily. 1 each 0   Cholecalciferol (VITAMIN D3) 25 MCG (1000 UT) CAPS Take 1,000 Units by mouth daily.     cyclobenzaprine (FLEXERIL) 10 MG tablet TAKE 1 TABLET BY MOUTH EVERYDAY AT BEDTIME 30 tablet 1   dicyclomine (BENTYL) 20 MG tablet Take 1 tablet (20 mg total) by mouth every 6 (six) hours as needed for spasms. 30 tablet 1   diltiazem (CARDIZEM CD) 240 MG 24 hr capsule Take 240 mg by mouth daily.     ezetimibe (ZETIA) 10 MG tablet Take 10 mg by mouth daily.     Glucagon (GVOKE HYPOPEN 2-PACK) 0.5 MG/0.1ML SOAJ Inject 0.5 mg into the skin as needed. 0.2 mL 3   insulin glargine (LANTUS) 100 UNIT/ML injection Inject 22 Units into the skin daily.     insulin lispro (HUMALOG) 100 UNIT/ML KwikPen For sugar 120 to 150 use one unit, for 151 to 200 use two units, for 201 to 250 use three units, for 251 to 300 use five units, for 301 to 350 use seven units, for  351 to 400 use 9 units, if sugar more than 400 call your primary care provider. 15 mL 0   Insulin Pen Needle (PEN NEEDLES) 31G X 5 MM MISC 1 each by Does not apply route 3 (three) times daily with meals. May substitute to any manufacturer covered by patient's insurance. 100 each 0   Insulin Syringe-Needle U-100 31G X 15/64" 0.5 ML MISC Use to inject insulin one time a day 100 each 0   Insulin Syringes, Disposable, U-100 1 ML MISC 2 Syringes by Does not apply route daily. Use to inject insulin into the skin 1 time daily. diag code E11.9. Insulin dependent 100 each 0   omeprazole (PRILOSEC) 40 MG capsule TAKE 1 CAPSULE BY MOUTH EVERY DAY 90 capsule 3   oxyCODONE-acetaminophen (PERCOCET/ROXICET) 5-325 MG tablet Take 1 tablet by mouth every 6 (six) hours as needed. 12 tablet 0   senna (SENOKOT) 8.6 MG tablet Take 1 tablet (8.6 mg total) by mouth as needed for constipation. 60 tablet 3   No current facility-administered medications for this visit.     ROS:  See HPI  Physical Exam:  ***  Incision:  *** Extremities:  ***  Neuro: *** Abdomen:  ***    Assessment/Plan:  This is a 68 y.o. female who is s/p: ***  -Fistula is ready for use whenever the patient is ready.  She states she may let dialysis use it next week -Dialysis on [address] -She can follow-up with our office as needed   Loel Dubonnet, PA-C [facility] [phone]   Clinic MD:  ***

## 2024-02-23 DIAGNOSIS — N186 End stage renal disease: Secondary | ICD-10-CM | POA: Diagnosis not present

## 2024-02-23 DIAGNOSIS — D509 Iron deficiency anemia, unspecified: Secondary | ICD-10-CM | POA: Diagnosis not present

## 2024-02-23 DIAGNOSIS — N2581 Secondary hyperparathyroidism of renal origin: Secondary | ICD-10-CM | POA: Diagnosis not present

## 2024-02-23 DIAGNOSIS — D631 Anemia in chronic kidney disease: Secondary | ICD-10-CM | POA: Diagnosis not present

## 2024-02-23 DIAGNOSIS — T8249XA Other complication of vascular dialysis catheter, initial encounter: Secondary | ICD-10-CM | POA: Diagnosis not present

## 2024-02-23 DIAGNOSIS — Z23 Encounter for immunization: Secondary | ICD-10-CM | POA: Diagnosis not present

## 2024-02-23 DIAGNOSIS — E1122 Type 2 diabetes mellitus with diabetic chronic kidney disease: Secondary | ICD-10-CM | POA: Diagnosis not present

## 2024-02-23 DIAGNOSIS — Z992 Dependence on renal dialysis: Secondary | ICD-10-CM | POA: Diagnosis not present

## 2024-02-24 DIAGNOSIS — T8249XA Other complication of vascular dialysis catheter, initial encounter: Secondary | ICD-10-CM | POA: Diagnosis not present

## 2024-02-24 DIAGNOSIS — Z992 Dependence on renal dialysis: Secondary | ICD-10-CM | POA: Diagnosis not present

## 2024-02-24 DIAGNOSIS — D631 Anemia in chronic kidney disease: Secondary | ICD-10-CM | POA: Diagnosis not present

## 2024-02-24 DIAGNOSIS — E1122 Type 2 diabetes mellitus with diabetic chronic kidney disease: Secondary | ICD-10-CM | POA: Diagnosis not present

## 2024-02-24 DIAGNOSIS — D509 Iron deficiency anemia, unspecified: Secondary | ICD-10-CM | POA: Diagnosis not present

## 2024-02-24 DIAGNOSIS — N2581 Secondary hyperparathyroidism of renal origin: Secondary | ICD-10-CM | POA: Diagnosis not present

## 2024-02-24 DIAGNOSIS — Z23 Encounter for immunization: Secondary | ICD-10-CM | POA: Diagnosis not present

## 2024-02-24 DIAGNOSIS — N186 End stage renal disease: Secondary | ICD-10-CM | POA: Diagnosis not present

## 2024-02-25 DIAGNOSIS — D631 Anemia in chronic kidney disease: Secondary | ICD-10-CM | POA: Diagnosis not present

## 2024-02-25 DIAGNOSIS — D509 Iron deficiency anemia, unspecified: Secondary | ICD-10-CM | POA: Diagnosis not present

## 2024-02-25 DIAGNOSIS — T8249XA Other complication of vascular dialysis catheter, initial encounter: Secondary | ICD-10-CM | POA: Diagnosis not present

## 2024-02-25 DIAGNOSIS — Z23 Encounter for immunization: Secondary | ICD-10-CM | POA: Diagnosis not present

## 2024-02-25 DIAGNOSIS — N2581 Secondary hyperparathyroidism of renal origin: Secondary | ICD-10-CM | POA: Diagnosis not present

## 2024-02-25 DIAGNOSIS — N186 End stage renal disease: Secondary | ICD-10-CM | POA: Diagnosis not present

## 2024-02-25 DIAGNOSIS — Z992 Dependence on renal dialysis: Secondary | ICD-10-CM | POA: Diagnosis not present

## 2024-02-25 DIAGNOSIS — E1122 Type 2 diabetes mellitus with diabetic chronic kidney disease: Secondary | ICD-10-CM | POA: Diagnosis not present

## 2024-03-01 DIAGNOSIS — D509 Iron deficiency anemia, unspecified: Secondary | ICD-10-CM | POA: Diagnosis not present

## 2024-03-01 DIAGNOSIS — D631 Anemia in chronic kidney disease: Secondary | ICD-10-CM | POA: Diagnosis not present

## 2024-03-01 DIAGNOSIS — N186 End stage renal disease: Secondary | ICD-10-CM | POA: Diagnosis not present

## 2024-03-01 DIAGNOSIS — Z23 Encounter for immunization: Secondary | ICD-10-CM | POA: Diagnosis not present

## 2024-03-01 DIAGNOSIS — N2581 Secondary hyperparathyroidism of renal origin: Secondary | ICD-10-CM | POA: Diagnosis not present

## 2024-03-01 DIAGNOSIS — Z992 Dependence on renal dialysis: Secondary | ICD-10-CM | POA: Diagnosis not present

## 2024-03-01 DIAGNOSIS — T8249XA Other complication of vascular dialysis catheter, initial encounter: Secondary | ICD-10-CM | POA: Diagnosis not present

## 2024-03-01 DIAGNOSIS — E1122 Type 2 diabetes mellitus with diabetic chronic kidney disease: Secondary | ICD-10-CM | POA: Diagnosis not present

## 2024-03-03 DIAGNOSIS — Z992 Dependence on renal dialysis: Secondary | ICD-10-CM | POA: Diagnosis not present

## 2024-03-03 DIAGNOSIS — D509 Iron deficiency anemia, unspecified: Secondary | ICD-10-CM | POA: Diagnosis not present

## 2024-03-03 DIAGNOSIS — E1122 Type 2 diabetes mellitus with diabetic chronic kidney disease: Secondary | ICD-10-CM | POA: Diagnosis not present

## 2024-03-03 DIAGNOSIS — N2581 Secondary hyperparathyroidism of renal origin: Secondary | ICD-10-CM | POA: Diagnosis not present

## 2024-03-03 DIAGNOSIS — D631 Anemia in chronic kidney disease: Secondary | ICD-10-CM | POA: Diagnosis not present

## 2024-03-03 DIAGNOSIS — N186 End stage renal disease: Secondary | ICD-10-CM | POA: Diagnosis not present

## 2024-03-03 DIAGNOSIS — Z23 Encounter for immunization: Secondary | ICD-10-CM | POA: Diagnosis not present

## 2024-03-03 DIAGNOSIS — T8249XA Other complication of vascular dialysis catheter, initial encounter: Secondary | ICD-10-CM | POA: Diagnosis not present

## 2024-03-06 DIAGNOSIS — N2581 Secondary hyperparathyroidism of renal origin: Secondary | ICD-10-CM | POA: Diagnosis not present

## 2024-03-06 DIAGNOSIS — Z23 Encounter for immunization: Secondary | ICD-10-CM | POA: Diagnosis not present

## 2024-03-06 DIAGNOSIS — T8249XA Other complication of vascular dialysis catheter, initial encounter: Secondary | ICD-10-CM | POA: Diagnosis not present

## 2024-03-06 DIAGNOSIS — E1122 Type 2 diabetes mellitus with diabetic chronic kidney disease: Secondary | ICD-10-CM | POA: Diagnosis not present

## 2024-03-06 DIAGNOSIS — D631 Anemia in chronic kidney disease: Secondary | ICD-10-CM | POA: Diagnosis not present

## 2024-03-06 DIAGNOSIS — Z992 Dependence on renal dialysis: Secondary | ICD-10-CM | POA: Diagnosis not present

## 2024-03-06 DIAGNOSIS — D509 Iron deficiency anemia, unspecified: Secondary | ICD-10-CM | POA: Diagnosis not present

## 2024-03-06 DIAGNOSIS — N186 End stage renal disease: Secondary | ICD-10-CM | POA: Diagnosis not present

## 2024-03-08 DIAGNOSIS — T8249XA Other complication of vascular dialysis catheter, initial encounter: Secondary | ICD-10-CM | POA: Diagnosis not present

## 2024-03-08 DIAGNOSIS — N2581 Secondary hyperparathyroidism of renal origin: Secondary | ICD-10-CM | POA: Diagnosis not present

## 2024-03-08 DIAGNOSIS — D509 Iron deficiency anemia, unspecified: Secondary | ICD-10-CM | POA: Diagnosis not present

## 2024-03-08 DIAGNOSIS — N186 End stage renal disease: Secondary | ICD-10-CM | POA: Diagnosis not present

## 2024-03-08 DIAGNOSIS — D631 Anemia in chronic kidney disease: Secondary | ICD-10-CM | POA: Diagnosis not present

## 2024-03-08 DIAGNOSIS — E1122 Type 2 diabetes mellitus with diabetic chronic kidney disease: Secondary | ICD-10-CM | POA: Diagnosis not present

## 2024-03-08 DIAGNOSIS — Z992 Dependence on renal dialysis: Secondary | ICD-10-CM | POA: Diagnosis not present

## 2024-03-10 DIAGNOSIS — E1122 Type 2 diabetes mellitus with diabetic chronic kidney disease: Secondary | ICD-10-CM | POA: Diagnosis not present

## 2024-03-10 DIAGNOSIS — Z992 Dependence on renal dialysis: Secondary | ICD-10-CM | POA: Diagnosis not present

## 2024-03-10 DIAGNOSIS — D631 Anemia in chronic kidney disease: Secondary | ICD-10-CM | POA: Diagnosis not present

## 2024-03-10 DIAGNOSIS — N2581 Secondary hyperparathyroidism of renal origin: Secondary | ICD-10-CM | POA: Diagnosis not present

## 2024-03-10 DIAGNOSIS — D509 Iron deficiency anemia, unspecified: Secondary | ICD-10-CM | POA: Diagnosis not present

## 2024-03-10 DIAGNOSIS — N186 End stage renal disease: Secondary | ICD-10-CM | POA: Diagnosis not present

## 2024-03-10 DIAGNOSIS — T8249XA Other complication of vascular dialysis catheter, initial encounter: Secondary | ICD-10-CM | POA: Diagnosis not present

## 2024-03-13 DIAGNOSIS — T8249XA Other complication of vascular dialysis catheter, initial encounter: Secondary | ICD-10-CM | POA: Diagnosis not present

## 2024-03-13 DIAGNOSIS — Z992 Dependence on renal dialysis: Secondary | ICD-10-CM | POA: Diagnosis not present

## 2024-03-13 DIAGNOSIS — N2581 Secondary hyperparathyroidism of renal origin: Secondary | ICD-10-CM | POA: Diagnosis not present

## 2024-03-13 DIAGNOSIS — D631 Anemia in chronic kidney disease: Secondary | ICD-10-CM | POA: Diagnosis not present

## 2024-03-13 DIAGNOSIS — N186 End stage renal disease: Secondary | ICD-10-CM | POA: Diagnosis not present

## 2024-03-13 DIAGNOSIS — D509 Iron deficiency anemia, unspecified: Secondary | ICD-10-CM | POA: Diagnosis not present

## 2024-03-13 DIAGNOSIS — E1122 Type 2 diabetes mellitus with diabetic chronic kidney disease: Secondary | ICD-10-CM | POA: Diagnosis not present

## 2024-03-14 ENCOUNTER — Other Ambulatory Visit: Payer: Self-pay | Admitting: Emergency Medicine

## 2024-03-14 DIAGNOSIS — R1030 Lower abdominal pain, unspecified: Secondary | ICD-10-CM

## 2024-03-14 DIAGNOSIS — Z760 Encounter for issue of repeat prescription: Secondary | ICD-10-CM

## 2024-03-14 DIAGNOSIS — E1159 Type 2 diabetes mellitus with other circulatory complications: Secondary | ICD-10-CM

## 2024-03-14 MED ORDER — EZETIMIBE 10 MG PO TABS: 10.0000 mg | ORAL_TABLET | Freq: Every day | ORAL | 3 refills | Status: AC

## 2024-03-14 MED ORDER — DICYCLOMINE HCL 20 MG PO TABS: 20.0000 mg | ORAL_TABLET | Freq: Four times a day (QID) | ORAL | 1 refills | Status: DC | PRN

## 2024-03-14 MED ORDER — INSULIN GLARGINE 100 UNIT/ML ~~LOC~~ SOLN: 22.0000 [IU] | Freq: Every day | SUBCUTANEOUS | 3 refills | Status: DC

## 2024-03-14 MED ORDER — ACCU-CHEK FASTCLIX LANCETS MISC: 3 refills | Status: AC

## 2024-03-14 MED ORDER — GVOKE HYPOPEN 2-PACK 0.5 MG/0.1ML ~~LOC~~ SOAJ: 0.5000 mg | SUBCUTANEOUS | 3 refills | Status: AC | PRN

## 2024-03-14 MED ORDER — CYCLOBENZAPRINE HCL 10 MG PO TABS: 10.0000 mg | ORAL_TABLET | Freq: Every day | ORAL | 3 refills | Status: DC

## 2024-03-14 MED ORDER — DILTIAZEM HCL ER COATED BEADS 240 MG PO CP24: 240.0000 mg | ORAL_CAPSULE | Freq: Every day | ORAL | 3 refills | Status: AC

## 2024-03-14 MED ORDER — VITAMIN D3 25 MCG (1000 UT) PO CAPS: 1000.0000 [IU] | ORAL_CAPSULE | Freq: Every day | ORAL | 3 refills | Status: AC

## 2024-03-14 MED ORDER — ATORVASTATIN CALCIUM 40 MG PO TABS: 40.0000 mg | ORAL_TABLET | Freq: Every day | ORAL | 3 refills | Status: AC

## 2024-03-14 NOTE — Telephone Encounter (Signed)
 Copied from CRM [id number]. Topic: Clinical - Medication Refill >> Mar 14, 2024 10:30 AM Sonny Dandy B wrote: Most Recent Primary Care Visit:  Provider: Georgina Quint  Department: [facility]  Visit Type: OFFICE VISIT  Date: 11/23/2023  Medication: ACCU-CHEK FASTCLIX LANCETS MISC,ACCU-CHEK GUIDE test strip, atorvastatin (LIPITOR) 40 MG tablet, Cholecalciferol (VITAMIN D3) 25 MCG (1000 UT) CAPS, cyclobenzaprine (FLEXERIL) 10 MG tablet, dicyclomine (BENTYL) 20 MG tablet,  diltiazem (CARDIZEM CD) 240 MG 24 hr capsule ezetimibe (ZETIA) 10 MG tablet, Glucagon (GVOKE HYPOPEN 2-PACK) 0.5 MG/0.1ML SOAJ   Has the patient contacted their pharmacy? Yes (Agent: If no, request that the patient contact the pharmacy for the refill. If patient does not wish to contact the pharmacy document the reason why and proceed with request.) (Agent: If yes, when and what did the pharmacy advise?)  Is this the correct pharmacy for this prescription? Yes If no, delete pharmacy and type the correct one.  This is the patient's preferred pharmacy:   [facility] [phone] [address] [phone]    Has the prescription been filled recently? Yes  Is the patient out of the medication? Yes  Has the patient been seen for an appointment in the last year OR does the patient have an upcoming appointment? Yes  Can we respond through MyChart? Yes  Agent: Please be advised that Rx refills may take up to 3 business days. We ask that you follow-up with your pharmacy.

## 2024-03-15 ENCOUNTER — Telehealth: Payer: Self-pay | Admitting: *Deleted

## 2024-03-15 DIAGNOSIS — D631 Anemia in chronic kidney disease: Secondary | ICD-10-CM | POA: Diagnosis not present

## 2024-03-15 DIAGNOSIS — N186 End stage renal disease: Secondary | ICD-10-CM | POA: Diagnosis not present

## 2024-03-15 DIAGNOSIS — T8249XA Other complication of vascular dialysis catheter, initial encounter: Secondary | ICD-10-CM | POA: Diagnosis not present

## 2024-03-15 DIAGNOSIS — Z992 Dependence on renal dialysis: Secondary | ICD-10-CM | POA: Diagnosis not present

## 2024-03-15 DIAGNOSIS — E1122 Type 2 diabetes mellitus with diabetic chronic kidney disease: Secondary | ICD-10-CM | POA: Diagnosis not present

## 2024-03-15 DIAGNOSIS — D509 Iron deficiency anemia, unspecified: Secondary | ICD-10-CM | POA: Diagnosis not present

## 2024-03-15 DIAGNOSIS — N2581 Secondary hyperparathyroidism of renal origin: Secondary | ICD-10-CM | POA: Diagnosis not present

## 2024-03-15 NOTE — Progress Notes (Signed)
 Complex Care Management Care Guide Note  03/15/2024 Name: Nancy [name] MRN: [id number] DOB: 1956-12-04  [name] is a 68 y.o. year old female who is a primary care patient of Georgina Quint, MD and is actively engaged with the care management team. I reached out to [employer] by phone today to assist with re-scheduling  with the RN Case Manager.  Follow up plan: Unsuccessful telephone outreach attempt made. A HIPAA compliant phone message was left for the patient providing contact information and requesting a return call.  Burman Nieves, CMA [place]  [facility] Guide Direct Dial: [phone]  Fax: [phone] Website: [email or web address]

## 2024-03-17 DIAGNOSIS — N2581 Secondary hyperparathyroidism of renal origin: Secondary | ICD-10-CM | POA: Diagnosis not present

## 2024-03-17 DIAGNOSIS — T8249XA Other complication of vascular dialysis catheter, initial encounter: Secondary | ICD-10-CM | POA: Diagnosis not present

## 2024-03-17 DIAGNOSIS — D631 Anemia in chronic kidney disease: Secondary | ICD-10-CM | POA: Diagnosis not present

## 2024-03-17 DIAGNOSIS — N186 End stage renal disease: Secondary | ICD-10-CM | POA: Diagnosis not present

## 2024-03-17 DIAGNOSIS — Z992 Dependence on renal dialysis: Secondary | ICD-10-CM | POA: Diagnosis not present

## 2024-03-17 DIAGNOSIS — D509 Iron deficiency anemia, unspecified: Secondary | ICD-10-CM | POA: Diagnosis not present

## 2024-03-17 DIAGNOSIS — E1122 Type 2 diabetes mellitus with diabetic chronic kidney disease: Secondary | ICD-10-CM | POA: Diagnosis not present

## 2024-03-22 DIAGNOSIS — N2581 Secondary hyperparathyroidism of renal origin: Secondary | ICD-10-CM | POA: Diagnosis not present

## 2024-03-22 DIAGNOSIS — T8249XA Other complication of vascular dialysis catheter, initial encounter: Secondary | ICD-10-CM | POA: Diagnosis not present

## 2024-03-22 DIAGNOSIS — N186 End stage renal disease: Secondary | ICD-10-CM | POA: Diagnosis not present

## 2024-03-22 DIAGNOSIS — D509 Iron deficiency anemia, unspecified: Secondary | ICD-10-CM | POA: Diagnosis not present

## 2024-03-22 DIAGNOSIS — Z992 Dependence on renal dialysis: Secondary | ICD-10-CM | POA: Diagnosis not present

## 2024-03-22 DIAGNOSIS — E1122 Type 2 diabetes mellitus with diabetic chronic kidney disease: Secondary | ICD-10-CM | POA: Diagnosis not present

## 2024-03-22 DIAGNOSIS — D631 Anemia in chronic kidney disease: Secondary | ICD-10-CM | POA: Diagnosis not present

## 2024-03-23 ENCOUNTER — Ambulatory Visit (INDEPENDENT_AMBULATORY_CARE_PROVIDER_SITE_OTHER)

## 2024-03-23 VITALS — Ht <= 58 in | Wt 142.0 lb

## 2024-03-23 DIAGNOSIS — Z0000 Encounter for general adult medical examination without abnormal findings: Secondary | ICD-10-CM | POA: Diagnosis not present

## 2024-03-23 DIAGNOSIS — Z780 Asymptomatic menopausal state: Secondary | ICD-10-CM

## 2024-03-23 NOTE — Progress Notes (Signed)
 Subjective:   Nancy [name] is a 68 y.o. who presents for a Medicare Wellness preventive visit.  Visit Complete: Virtual I connected with  [name] on 03/23/24 by a audio enabled telemedicine application and verified that I am speaking with the correct person using two identifiers.  Patient Location: Home  Provider Location: Home Office  I discussed the limitations of evaluation and management by telemedicine. The patient expressed understanding and agreed to proceed.  Vital Signs: Because this visit was a virtual/telehealth visit, some criteria may be missing or patient reported. Any vitals not documented were not able to be obtained and vitals that have been documented are patient reported.  VideoDeclined- This patient declined Librarian, academic. Therefore the visit was completed with audio only.  Persons Participating in Visit: Patient.  AWV Questionnaire: No: Patient Medicare AWV questionnaire was not completed prior to this visit.  Cardiac Risk Factors include: advanced age (>41men, >68 women);diabetes mellitus;hypertension;Other (see comment);dyslipidemia, Risk factor comments: CHF,  CKD     Objective:    Today's Vitals   03/23/24 1544  Weight: 142 lb (64.4 kg)  Height: 4\' 10"  (1.473 m)   Body mass index is 29.68 kg/m.     03/23/2024    4:12 PM 01/31/2024    1:35 PM 01/10/2024    6:46 AM 10/22/2023    6:36 AM 06/17/2023    6:45 PM 06/17/2023    6:44 PM 04/24/2022    2:49 PM  Advanced Directives  Does Patient Have a Medical Advance Directive? No No No No No No No  Would patient like information on creating a medical advance directive?   No - Patient declined No - Patient declined   No - Patient declined    Current Medications (verified) Outpatient Encounter Medications as of 03/23/2024  Medication Sig   Accu-Chek FastClix Lancets MISC Use to check blood sugar up to 3 times a day   ACCU-CHEK GUIDE test strip CHECK BLOOD SUGAR 3  TIMES A DAY   atorvastatin (LIPITOR) 40 MG tablet Take 1 tablet (40 mg total) by mouth daily.   Blood Glucose Monitoring Suppl (ACCU-CHEK GUIDE) w/Device KIT 1 each by Does not apply route 3 (three) times daily. Check blood sugar 3 times a day   Blood Pressure Monitoring (ADVANCED ONE STEP BP MONITOR) MISC 1 Units by Does not apply route daily.   Cholecalciferol (VITAMIN D3) 25 MCG (1000 UT) CAPS Take 1 capsule (1,000 Units total) by mouth daily.   cyclobenzaprine (FLEXERIL) 10 MG tablet Take 1 tablet (10 mg total) by mouth at bedtime.   dicyclomine (BENTYL) 20 MG tablet Take 1 tablet (20 mg total) by mouth every 6 (six) hours as needed for spasms.   diltiazem (CARDIZEM CD) 240 MG 24 hr capsule Take 1 capsule (240 mg total) by mouth daily.   ezetimibe (ZETIA) 10 MG tablet Take 1 tablet (10 mg total) by mouth daily.   Glucagon (GVOKE HYPOPEN 2-PACK) 0.5 MG/0.1ML SOAJ Inject 0.5 mg into the skin as needed.   insulin glargine (LANTUS) 100 UNIT/ML injection Inject 0.22 mLs (22 Units total) into the skin daily.   insulin lispro (HUMALOG) 100 UNIT/ML KwikPen For sugar 120 to 150 use one unit, for 151 to 200 use two units, for 201 to 250 use three units, for 251 to 300 use five units, for 301 to 350 use seven units, for 351 to 400 use 9 units, if sugar more than 400 call your primary care provider.   Insulin Pen  Needle (PEN NEEDLES) 31G X 5 MM MISC 1 each by Does not apply route 3 (three) times daily with meals. May substitute to any manufacturer covered by patient's insurance.   Insulin Syringe-Needle U-100 31G X 15/64" 0.5 ML MISC Use to inject insulin one time a day   Insulin Syringes, Disposable, U-100 1 ML MISC 2 Syringes by Does not apply route daily. Use to inject insulin into the skin 1 time daily. diag code E11.9. Insulin dependent   omeprazole (PRILOSEC) 40 MG capsule TAKE 1 CAPSULE BY MOUTH EVERY DAY   oxyCODONE-acetaminophen (PERCOCET/ROXICET) 5-325 MG tablet Take 1 tablet by mouth every 6 (six)  hours as needed.   senna (SENOKOT) 8.6 MG tablet Take 1 tablet (8.6 mg total) by mouth as needed for constipation.   VELPHORO 500 MG chewable tablet Chew 500 mg by mouth 3 (three) times daily.   No facility-administered encounter medications on file as of 03/23/2024.    Allergies (verified) Ace inhibitors, Amitriptyline hcl, Aspirin, Latex, and Prednisone   History: Past Medical History:  Diagnosis Date   Carpal tunnel syndrome, bilateral    confirmed on nerve conduction studies   Chronic hepatitis C (HCC)    considered cured 06/2016 post retroviral therapy    Chronic kidney disease    dialysis M-W-F   Diabetes mellitus type 2, uncontrolled    Diabetic peripheral neuropathy (HCC)    GERD (gastroesophageal reflux disease)    History of endometriosis    History of Helicobacter infection 10/2006   Hyperlipidemia    Hypertension    Liver fibrosis 02/17/2016   F2/3    Sleep apnea 09/2018   Results showed very mild OSA - no CPAP   Past Surgical History:  Procedure Laterality Date   ABDOMINAL HYSTERECTOMY     AV FISTULA PLACEMENT Left 10/22/2023   Procedure: LEFT ARM BRACHIOCEPHALIC ARTERIOVENOUS (AV) FISTULA CREATION;  Surgeon: Young Hensen, MD;  Location: [facility] OR;  Service: Vascular;  Laterality: Left;   CARPAL TUNNEL RELEASE Bilateral    COLONOSCOPY     EYE SURGERY Bilateral    cataracts removed   FISTULA SUPERFICIALIZATION Left 01/10/2024   Procedure: LEFT ARM FISTULA SUPERFICIALIZATION;  Surgeon: Young Hensen, MD;  Location: [facility] OR;  Service: Vascular;  Laterality: Left;   LIGATION OF COMPETING BRANCHES OF ARTERIOVENOUS FISTULA Left 01/10/2024   Procedure: REVISION OF ARTERIOVENOUS FISTULA WITH SIDE BRANCH LIGATION;  Surgeon: Young Hensen, MD;  Location: [facility] OR;  Service: Vascular;  Laterality: Left;   REMOVAL OF A DIALYSIS CATHETER Right 02/02/2024   Procedure: DIALYSIS CATHETER EXCHANGE IN RIGHT INTERNAL JUGULAR;  Surgeon: Young Hensen, MD;  Location:  [facility] OR;  Service: Vascular;  Laterality: Right;   UPPER GI ENDOSCOPY     Family History  Problem Relation Age of Onset   Cancer Mother        lung   Kidney disease Mother    Lung cancer Maternal Uncle    Breast cancer Maternal Grandmother    Liver cancer Maternal Grandfather    Heart disease Brother    Depression Brother    Diabetes Father    Dementia Father    Colon cancer Neg Hx    Social History   Socioeconomic History   Marital status: Single    Spouse name: Not on file   Number of children: 0   Years of education: 12   Highest education level: Not on file  Occupational History   Occupation: CNA    Employer: [employer]  Occupation: Disabled  Tobacco Use   Smoking status: Never   Smokeless tobacco: Never  Vaping Use   Vaping status: Never Used  Substance and Sexual Activity   Alcohol use: No    Alcohol/week: 0.0 standard drinks of alcohol   Drug use: No   Sexual activity: Yes    Birth control/protection: Surgical    Comment: Hysterectomy  Other Topics Concern   Not on file  Social History Narrative   Current Social History 08/28/2019        Patient lives alone in a 7th floor apartment with elevator. There are not steps up to the entrance the patient uses.       Patient's method of transportation is via family member (mom or friend).      The highest level of education was high school diploma.      The patient currently disabled.      Identified important Relationships are "My mother" - Patient is caregiver for Mother      Pets : None       Interests / Fun: "TV, movies"       Receiving HD on Monday, Wednesday, Friday      Current Stressors: "People, my health"       Religious / Personal Beliefs: "Baptist"       L. Corinna Dickens, RN, BSN       Social Drivers of Health   Financial Resource Strain: Low Risk  (04/24/2022)   Overall Financial Resource Strain (CARDIA)    Difficulty of Paying Living Expenses: Not hard at all  Food Insecurity: No Food  Insecurity (01/06/2024)   Hunger Vital Sign    Worried About Running Out of Food in the Last Year: Never true    Ran Out of Food in the Last Year: Never true  Transportation Needs: No Transportation Needs (01/06/2024)   PRAPARE - Administrator, Civil Service (Medical): No    Lack of Transportation (Non-Medical): No  Physical Activity: Inactive (03/23/2024)   Exercise Vital Sign    Days of Exercise per Week: 0 days    Minutes of Exercise per Session: 0 min  Stress: Stress Concern Present (03/23/2024)   [employer] of Occupational Health - Occupational Stress Questionnaire    Feeling of Stress : To some extent  Social Connections: Moderately Isolated (03/23/2024)   Social Connection and Isolation Panel [NHANES]    Frequency of Communication with Friends and Family: More than three times a week    Frequency of Social Gatherings with Friends and Family: Never    Attends Religious Services: Never    Database administrator or Organizations: No    Attends Engineer, structural: Never    Marital Status: Living with partner    Tobacco Counseling Counseling given: Not Answered    Clinical Intake:  Pre-visit preparation completed: Yes  Pain : No/denies pain     BMI - recorded: 29.68 Nutritional Status: BMI 25 -29 Overweight Nutritional Risks: Nausea/ vomitting/ diarrhea (all three) Diabetes: Yes CBG done?: No Did pt. bring in CBG monitor from home?: No  Lab Results  Component Value Date   HGBA1C 8.9 (A) 08/16/2023   HGBA1C 11.5 (A) 05/19/2023   HGBA1C 9.4 (A) 02/16/2023     How often do you need to have someone help you when you read instructions, pamphlets, or other written materials from your doctor or pharmacy?: 1 - Never  Interpreter Needed?: No  Information entered by :: Laken Rog, RMA   Activities  of Daily Living     03/23/2024    3:45 PM 02/02/2024    8:31 AM  In your present state of health, do you have any difficulty performing the  following activities:  Hearing? 0   Vision? 0   Difficulty concentrating or making decisions? 0   Walking or climbing stairs? 0   Dressing or bathing? 0   Doing errands, shopping? 0 0  Comment mother drives her or her uncle   Quarry manager and eating ? N   Using the Toilet? N   In the past six months, have you accidently leaked urine? N   Do you have problems with loss of bowel control? N   Managing your Medications? N   Managing your Finances? N   Housekeeping or managing your Housekeeping? N     Patient Care Team: Georgina Quint, MD as PCP - General (Internal Medicine) Christell Constant, MD as PCP - Cardiology (Cardiology) Plyler, Cecil Cranker, RD as Diabetes Educator (Dietician) Gelene Mink, OD as Referring Physician (Optometry) Dorian Pod, DMD as Referring Physician (Dentistry) Rachael Fee, MD (Inactive) as Attending Physician (Gastroenterology) Estanislado Emms, MD as Consulting Physician (Internal Medicine) Johney Maine, MD as Consulting Physician (Hematology) [facility] Lucendia Herrlich, [place], RN as [facility] Care Management  Indicate any recent Medical Services you may have received from other than [facility] providers in the past year (date may be approximate).     Assessment:   This is a routine wellness examination for Leeyah.  Hearing/Vision screen Hearing Screening - Comments:: Denies hearing difficulties   Vision Screening - Comments:: Wears eyeglasses   Goals Addressed               This Visit's Progress     Patient Stated (pt-stated)        Stopping dialysis/ getting off.       Depression Screen     03/23/2024    4:18 PM 10/04/2023   10:55 AM 09/15/2023   10:04 AM 08/16/2023    1:06 PM 06/29/2023   10:54 AM 05/19/2023    1:06 PM 04/26/2023    2:14 PM  PHQ 2/9 Scores  PHQ - 2 Score 3 0 2 4 0 0 0  PHQ- 9 Score   13 9       Fall Risk     03/23/2024    4:13 PM 10/04/2023   10:55 AM 09/15/2023    10:04 AM 08/16/2023    1:06 PM 06/29/2023   10:54 AM  Fall Risk   Falls in the past year? 0 1 1 1  0  Number falls in past yr: 0 0 0 0 0  Injury with Fall? 0 0 0 0 0  Risk for fall due to : No Fall Risks No Fall Risks History of fall(s) No Fall Risks No Fall Risks  Follow up Falls prevention discussed;Falls evaluation completed Falls evaluation completed Falls evaluation completed Falls evaluation completed Falls evaluation completed    MEDICARE RISK AT HOME:  Medicare Risk at Home Any stairs in or around the home?: Yes If so, are there any without handrails?: Yes Home free of loose throw rugs in walkways, pet beds, electrical cords, etc?: Yes Adequate lighting in your home to reduce risk of falls?: Yes Life alert?: No Use of a cane, walker or w/c?: No Grab bars in the bathroom?: Yes Shower chair or bench in shower?: No Elevated toilet seat or a handicapped toilet?: No  TIMED UP AND GO:  Was the test performed?  No  Cognitive Function: Declined: Patient declined cognitive screening, but was able to answer questions in an accurate and timely manner. No cognitive impairments observed.        Immunizations Immunization History  Administered Date(s) Administered   Hepatitis A, Adult 10/02/2015, 06/18/2016   Hepatitis B, ADULT 10/02/2015, 02/17/2016, 06/18/2016   Influenza Whole 10/12/2007, 08/31/2008, 09/09/2009, 08/21/2010   Influenza, Seasonal, Injecte, Preservative Fre 08/04/2023   Influenza,inj,Quad PF,6+ Mos 08/18/2013, 10/02/2014, 08/15/2015, 09/08/2016, 09/14/2017, 08/24/2018, 09/12/2019, 10/09/2021   Influenza-Unspecified 09/10/2011   PFIZER(Purple Top)SARS-COV-2 Vaccination 11/05/2020   PPD Test 05/17/2012, 05/09/2013   Pneumococcal Conjugate-13 03/22/2018   Pneumococcal Polysaccharide-23 07/08/2011, 10/10/2019   Tdap 07/08/2011, 01/03/2017   Zoster Recombinant(Shingrix) 10/31/2021    Screening Tests Health Maintenance  Topic Date Due   Zoster Vaccines- Shingrix  (2 of 2) 12/26/2021   FOOT EXAM  03/24/2022   OPHTHALMOLOGY EXAM  07/15/2022   DEXA SCAN  Never done   COVID-19 Vaccine (2 - 2024-25 season) 08/08/2023   HEMOGLOBIN A1C  11/15/2023   Colonoscopy  04/02/2024   INFLUENZA VACCINE  07/07/2024   Pneumonia Vaccine 43+ Years old (4 of 4 - PCV20 or PCV21) 10/09/2024   MAMMOGRAM  12/01/2024   Medicare Annual Wellness (AWV)  03/23/2025   DTaP/Tdap/Td (3 - Td or Tdap) 01/03/2027   Hepatitis C Screening  Completed   HPV VACCINES  Aged Out   Meningococcal B Vaccine  Aged Out    Health Maintenance  Health Maintenance Due  Topic Date Due   Zoster Vaccines- Shingrix (2 of 2) 12/26/2021   FOOT EXAM  03/24/2022   OPHTHALMOLOGY EXAM  07/15/2022   DEXA SCAN  Never done   COVID-19 Vaccine (2 - 2024-25 season) 08/08/2023   HEMOGLOBIN A1C  11/15/2023   Colonoscopy  04/02/2024   Health Maintenance Items Addressed: DEXA ordered, Diabetic Foot Exam scheduled, A1C, See Nurse Notes  Additional Screening:  Vision Screening: Recommended annual ophthalmology exams for early detection of glaucoma and other disorders of the eye.  Dental Screening: Recommended annual dental exams for proper oral hygiene  Community Resource Referral / Chronic Care Management: CRR required this visit?  No   CCM required this visit?  No     Plan:     I have personally reviewed and noted the following in the patient's chart:   Medical and social history Use of alcohol, tobacco or illicit drugs  Current medications and supplements including opioid prescriptions. Patient is currently taking opioid prescriptions. Information provided to patient regarding non-opioid alternatives. Patient advised to discuss non-opioid treatment plan with their provider. Functional ability and status Nutritional status Physical activity Advanced directives List of other physicians Hospitalizations, surgeries, and ER visits in previous 12 months Vitals Screenings to include cognitive,  depression, and falls Referrals and appointments  In addition, I have reviewed and discussed with patient certain preventive protocols, quality metrics, and best practice recommendations. A written personalized care plan for preventive services as well as general preventive health recommendations were provided to patient.     Constantinos Krempasky L Zaia Carre, CMA   03/23/2024   After Visit Summary: (MyChart) Due to this being a telephonic visit, the after visit summary with patients personalized plan was offered to patient via MyChart   Notes: Please refer to Routing Comments.

## 2024-03-23 NOTE — Patient Instructions (Signed)
 Nancy [name] , Thank you for taking time to come for your Medicare Wellness Visit. I appreciate your ongoing commitment to your health goals. Please review the following plan we discussed and let me know if I can assist you in the future.   Referrals/Orders/Follow-Ups/Clinician Recommendations: It was nice talking with you today.  You are due for a 2nd Shingles vaccine, a foot exam, a diabetic eye exam and a A1c Check.  You have an order for:  [x]   Bone Density    Please call for appointment:  [facility] [address] [place], [place] [address] [phone]  Make sure to wear two-piece clothing.  No lotions, powders, or deodorants the day of the appointment. Make sure to bring picture ID and insurance card.  Bring list of medications you are currently taking including any supplements.    This is a list of the screening recommended for you and due dates:  Health Maintenance  Topic Date Due   Zoster (Shingles) Vaccine (2 of 2) 12/26/2021   Complete foot exam   03/24/2022   Eye exam for diabetics  07/15/2022   DEXA scan (bone density measurement)  Never done   Medicare Annual Wellness Visit  04/25/2023   COVID-19 Vaccine (2 - 2024-25 season) 08/08/2023   Hemoglobin A1C  11/15/2023   Colon Cancer Screening  04/02/2024   Flu Shot  07/07/2024   Pneumonia Vaccine (4 of 4 - PCV20 or PCV21) 10/09/2024   Mammogram  12/01/2024   DTaP/Tdap/Td vaccine (3 - Td or Tdap) 01/03/2027   Hepatitis C Screening  Completed   HPV Vaccine  Aged Out   Meningitis B Vaccine  Aged Out    Advanced directives: (Declined) Advance directive discussed with you today. Even though you declined this today, please call our office should you change your mind, and we can give you the proper paperwork for you to fill out.  Next Medicare Annual Wellness Visit scheduled for next year: Yes

## 2024-03-24 DIAGNOSIS — Z992 Dependence on renal dialysis: Secondary | ICD-10-CM | POA: Diagnosis not present

## 2024-03-24 DIAGNOSIS — N186 End stage renal disease: Secondary | ICD-10-CM | POA: Diagnosis not present

## 2024-03-24 DIAGNOSIS — D631 Anemia in chronic kidney disease: Secondary | ICD-10-CM | POA: Diagnosis not present

## 2024-03-24 DIAGNOSIS — T8249XA Other complication of vascular dialysis catheter, initial encounter: Secondary | ICD-10-CM | POA: Diagnosis not present

## 2024-03-24 DIAGNOSIS — D509 Iron deficiency anemia, unspecified: Secondary | ICD-10-CM | POA: Diagnosis not present

## 2024-03-24 DIAGNOSIS — E1122 Type 2 diabetes mellitus with diabetic chronic kidney disease: Secondary | ICD-10-CM | POA: Diagnosis not present

## 2024-03-24 DIAGNOSIS — N2581 Secondary hyperparathyroidism of renal origin: Secondary | ICD-10-CM | POA: Diagnosis not present

## 2024-03-27 DIAGNOSIS — Z992 Dependence on renal dialysis: Secondary | ICD-10-CM | POA: Diagnosis not present

## 2024-03-27 DIAGNOSIS — D509 Iron deficiency anemia, unspecified: Secondary | ICD-10-CM | POA: Diagnosis not present

## 2024-03-27 DIAGNOSIS — D631 Anemia in chronic kidney disease: Secondary | ICD-10-CM | POA: Diagnosis not present

## 2024-03-27 DIAGNOSIS — N186 End stage renal disease: Secondary | ICD-10-CM | POA: Diagnosis not present

## 2024-03-27 DIAGNOSIS — E1122 Type 2 diabetes mellitus with diabetic chronic kidney disease: Secondary | ICD-10-CM | POA: Diagnosis not present

## 2024-03-27 DIAGNOSIS — N2581 Secondary hyperparathyroidism of renal origin: Secondary | ICD-10-CM | POA: Diagnosis not present

## 2024-03-27 DIAGNOSIS — T8249XA Other complication of vascular dialysis catheter, initial encounter: Secondary | ICD-10-CM | POA: Diagnosis not present

## 2024-03-28 ENCOUNTER — Encounter: Payer: Self-pay | Admitting: Emergency Medicine

## 2024-03-28 ENCOUNTER — Ambulatory Visit (INDEPENDENT_AMBULATORY_CARE_PROVIDER_SITE_OTHER): Admitting: Emergency Medicine

## 2024-03-28 VITALS — BP 118/78 | HR 66 | Temp 98.9°F | Ht <= 58 in

## 2024-03-28 DIAGNOSIS — E785 Hyperlipidemia, unspecified: Secondary | ICD-10-CM

## 2024-03-28 DIAGNOSIS — N185 Chronic kidney disease, stage 5: Secondary | ICD-10-CM | POA: Diagnosis not present

## 2024-03-28 DIAGNOSIS — E1159 Type 2 diabetes mellitus with other circulatory complications: Secondary | ICD-10-CM

## 2024-03-28 DIAGNOSIS — I152 Hypertension secondary to endocrine disorders: Secondary | ICD-10-CM

## 2024-03-28 DIAGNOSIS — E1169 Type 2 diabetes mellitus with other specified complication: Secondary | ICD-10-CM

## 2024-03-28 DIAGNOSIS — S40022A Contusion of left upper arm, initial encounter: Secondary | ICD-10-CM | POA: Diagnosis not present

## 2024-03-28 NOTE — Assessment & Plan Note (Signed)
Chronic stable conditions Continue atorvastatin 40 mg and Zetia 10 mg daily

## 2024-03-28 NOTE — Patient Instructions (Signed)
 Hematoma A hematoma is a collection of blood. A hematoma can happen: Under the skin. In an organ. In a body space. In a joint space. In other tissues. The blood can thicken (clot) to form a lump that you can see and feel. The lump is often hard and may become sore and tender. The lump can be very small or very big. Most hematomas get better in a few days to weeks. However, some of these may be serious and need medical care. What are the causes? This condition is caused by: An injury. Blood that leaks under the skin. Problems from surgeries. Medical conditions that cause bleeding or bruising. What increases the risk? You are more likely to develop this condition if: You are an older adult. You use medicines that thin your blood. You use NSAIDs, such as ibuprofen, often for pain. You play contact sports. What are the signs or symptoms? Symptoms depend on where the hematoma is in your body. If the hematoma is under the skin, there is: A firm lump on the body. Pain and tenderness in the area. Bruising. The skin above the lump may be blue, dark blue, purple-red, or yellowish. If the hematoma is deep in the tissues or body spaces, there may be: Blood in the stomach. This may cause pain in the belly (abdomen), weakness, passing out (fainting), and shortness of breath. Blood in the head. This may cause a headache, weakness, trouble speaking or understanding speech, or passing out. How is this treated? Treatment depends on the cause, size, and location of the hematoma. Treatment may include: Doing nothing. Many hematomas go away on their own without treatment. Surgery or close monitoring. This may be needed for large hematomas or hematomas that affect the body's organs. Medicines. These may be given if a medical condition caused the hematoma. Follow these instructions at home: Managing pain, stiffness, and swelling  If told, put ice on the injured area. To do this: Put ice in a plastic  bag. Place a towel between your skin and the bag. Leave the ice on for 20 minutes, 2-3 times a day for the first two days. If your skin turns bright red, take off the ice right away to prevent skin damage. The risk of skin damage is higher if you cannot feel pain, heat, or cold. If told, put heat on the injured area. Do this as often as told by your doctor. Use the heat source that your doctor recommends, such as a moist heat pack or a heating pad. Place a towel between your skin and the heat source. Leave the heat on for 20-30 minutes. If your skin turns bright red, take off the heat right away to prevent burns. The risk of burns is higher if you cannot feel pain, heat, or cold. Raise the injured area above the level of your heart while you are sitting or lying down. Wrap the affected area with an elastic bandage, if told by your doctor. Do not wrap the bandage too tight. If your hematoma is on a leg or foot and is painful, your doctor may give you crutches. Use them as told by your doctor. General instructions Take over-the-counter and prescription medicines only as told by your doctor. Keep all follow-up visits. Your doctor may want to see how your hematoma is healing with treatment. Contact a doctor if: You have a fever. The swelling or bruising gets worse. You start to get more hematomas. Your pain gets worse. Your pain is not getting better  with medicine. The skin over the hematoma breaks or starts to bleed. Get help right away if: Your hematoma is in your chest or belly and you: Pass out. Feel weak. Become short of breath. You have a hematoma on your scalp that is caused by a fall or injury, and you: Have a headache that gets worse. Have trouble speaking or understanding speech. Become less alert or you pass out. These symptoms may be an emergency. Get help right away. Call 911. Do not wait to see if the symptoms will go away. Do not drive yourself to the hospital This  information is not intended to replace advice given to you by your health care provider. Make sure you discuss any questions you have with your health care provider. Document Revised: 05/18/2022 Document Reviewed: 05/18/2022 Elsevier Patient Education  2024 [employer].

## 2024-03-28 NOTE — Assessment & Plan Note (Signed)
 On hemodialysis.  No complications. Overall doing very well

## 2024-03-28 NOTE — Assessment & Plan Note (Signed)
 BP Readings from Last 3 Encounters:  03/28/24 118/78  02/22/24 (!) 163/81  02/02/24 (!) 127/113  Well-controlled hypertension Continues diltiazem  240 mg daily Well-controlled diabetes Continues daily insulin  glargine 22 units Continues insulin  lispro as per sliding scale

## 2024-03-28 NOTE — Assessment & Plan Note (Signed)
 With resultant hematoma.  Stable without complications. No infection.  No concerns.

## 2024-03-28 NOTE — Progress Notes (Signed)
 Nancy [name] 68 y.o.   No chief complaint on file.   HISTORY OF PRESENT ILLNESS: This is a 68 y.o. female concerned about hematoma on left upper arm from needle stuck by dialysis nurse 2 days ago No other complaints or medical concerns today Also here for follow-up of chronic medical conditions.  HPI   Prior to Admission medications   Medication Sig Start Date End Date Taking? Authorizing Provider  Accu-Chek FastClix Lancets MISC Use to check blood sugar up to 3 times a day 03/14/24   Elvira Hammersmith, MD  ACCU-CHEK GUIDE test strip CHECK BLOOD SUGAR 3 TIMES A DAY 03/30/23   Elvira Hammersmith, MD  atorvastatin  (LIPITOR) 40 MG tablet Take 1 tablet (40 mg total) by mouth daily. 03/14/24   Marianny Goris Jose, MD  Blood Glucose Monitoring Suppl (ACCU-CHEK GUIDE) w/Device KIT 1 each by Does not apply route 3 (three) times daily. Check blood sugar 3 times a day 06/19/23   Arrien, Curlee Doss, MD  Blood Pressure Monitoring (ADVANCED ONE STEP BP MONITOR) MISC 1 Units by Does not apply route daily. 07/26/23   Jann Melody, MD  Cholecalciferol (VITAMIN D3) 25 MCG (1000 UT) CAPS Take 1 capsule (1,000 Units total) by mouth daily. 03/14/24   Elvira Hammersmith, MD  cyclobenzaprine  (FLEXERIL ) 10 MG tablet Take 1 tablet (10 mg total) by mouth at bedtime. 03/14/24   Elvira Hammersmith, MD  dicyclomine  (BENTYL ) 20 MG tablet Take 1 tablet (20 mg total) by mouth every 6 (six) hours as needed for spasms. 03/14/24   Elvira Hammersmith, MD  diltiazem  (CARDIZEM  CD) 240 MG 24 hr capsule Take 1 capsule (240 mg total) by mouth daily. 03/14/24   Elvira Hammersmith, MD  ezetimibe  (ZETIA ) 10 MG tablet Take 1 tablet (10 mg total) by mouth daily. 03/14/24   Fabrizio Filip Jose, MD  Glucagon  (GVOKE HYPOPEN  2-PACK) 0.5 MG/0.1ML SOAJ Inject 0.5 mg into the skin as needed. 03/14/24   Genelle Economou Jose, MD  insulin  glargine (LANTUS ) 100 UNIT/ML injection Inject 0.22 mLs (22 Units total) into the  skin daily. 03/14/24   Elvira Hammersmith, MD  insulin  lispro (HUMALOG ) 100 UNIT/ML KwikPen For sugar 120 to 150 use one unit, for 151 to 200 use two units, for 201 to 250 use three units, for 251 to 300 use five units, for 301 to 350 use seven units, for 351 to 400 use 9 units, if sugar more than 400 call your primary care provider. 06/19/23   Arrien, Mauricio Daniel, MD  Insulin  Pen Needle (PEN NEEDLES) 31G X 5 MM MISC 1 each by Does not apply route 3 (three) times daily with meals. May substitute to any manufacturer covered by patient's insurance. 06/19/23   Arrien, Curlee Doss, MD  Insulin  Syringe-Needle U-100 31G X 15/64" 0.5 ML MISC Use to inject insulin  one time a day 06/19/23   Arrien, Curlee Doss, MD  Insulin  Syringes, Disposable, U-100 1 ML MISC 2 Syringes by Does not apply route daily. Use to inject insulin  into the skin 1 time daily. diag code E11.9. Insulin  dependent 06/19/23   Arrien, Curlee Doss, MD  omeprazole  (PRILOSEC) 40 MG capsule TAKE 1 CAPSULE BY MOUTH EVERY DAY 03/30/23   Elvira Hammersmith, MD  oxyCODONE -acetaminophen  (PERCOCET/ROXICET) 5-325 MG tablet Take 1 tablet by mouth every 6 (six) hours as needed. 02/02/24   Butch Cashing, PA-C  senna (SENOKOT) 8.6 MG tablet Take 1 tablet (8.6 mg total) by mouth as needed for constipation.  07/08/20   Jinwala, Sagar H, MD  VELPHORO 500 MG chewable tablet Chew 500 mg by mouth 3 (three) times daily.    [provider]    Allergies  Allergen Reactions   Ace Inhibitors Other (See Comments) and Cough    Persistent dry cough   Amitriptyline Hcl Nausea And Vomiting and Other (See Comments)    GI Intolerance/Upset stomach   Aspirin  Other (See Comments)    GI Intolerance   Latex Itching   Prednisone  Nausea And Vomiting    Patient Active Problem List   Diagnosis Date Noted   Hypertensive heart and chronic kidney disease stage 5 (HCC) 06/30/2023   Chronic diastolic CHF (congestive heart failure) (HCC) 06/18/2023    Class 1 obesity 06/18/2023   Uncontrolled type 2 diabetes mellitus with hyperglycemia (HCC) 05/19/2023   CKD (chronic kidney disease) stage 5, GFR less than 15 ml/min (HCC) 03/09/2023   Stage 4 chronic kidney disease (HCC) 02/09/2022   Hyperlipidemia associated with type 2 diabetes mellitus (HCC) 04/20/2019   Vaginal atrophy 07/22/2018   GERD (gastroesophageal reflux disease) 01/16/2014   Lower abdominal pain 03/23/2013   Carpal tunnel syndrome, bilateral    Hypertension associated with diabetes (HCC) 11/09/2006   Controlled diabetes mellitus with retinopathy (HCC) 12/07/2002    Past Medical History:  Diagnosis Date   Carpal tunnel syndrome, bilateral    confirmed on nerve conduction studies   Chronic hepatitis C (HCC)    considered cured 06/2016 post retroviral therapy    Chronic kidney disease    dialysis M-W-F   Diabetes mellitus type 2, uncontrolled    Diabetic peripheral neuropathy (HCC)    GERD (gastroesophageal reflux disease)    History of endometriosis    History of Helicobacter infection 10/2006   Hyperlipidemia    Hypertension    Liver fibrosis 02/17/2016   F2/3    Sleep apnea 09/2018   Results showed very mild OSA - no CPAP    Past Surgical History:  Procedure Laterality Date   ABDOMINAL HYSTERECTOMY     AV FISTULA PLACEMENT Left 10/22/2023   Procedure: LEFT ARM BRACHIOCEPHALIC ARTERIOVENOUS (AV) FISTULA CREATION;  Surgeon: Young Hensen, MD;  Location: [facility] OR;  Service: Vascular;  Laterality: Left;   CARPAL TUNNEL RELEASE Bilateral    COLONOSCOPY     EYE SURGERY Bilateral    cataracts removed   FISTULA SUPERFICIALIZATION Left 01/10/2024   Procedure: LEFT ARM FISTULA SUPERFICIALIZATION;  Surgeon: Young Hensen, MD;  Location: [facility] OR;  Service: Vascular;  Laterality: Left;   LIGATION OF COMPETING BRANCHES OF ARTERIOVENOUS FISTULA Left 01/10/2024   Procedure: REVISION OF ARTERIOVENOUS FISTULA WITH SIDE BRANCH LIGATION;  Surgeon: Young Hensen, MD;   Location: [facility] OR;  Service: Vascular;  Laterality: Left;   REMOVAL OF A DIALYSIS CATHETER Right 02/02/2024   Procedure: DIALYSIS CATHETER EXCHANGE IN RIGHT INTERNAL JUGULAR;  Surgeon: Young Hensen, MD;  Location: [facility] OR;  Service: Vascular;  Laterality: Right;   UPPER GI ENDOSCOPY      Social History   Socioeconomic History   Marital status: Single    Spouse name: Not on file   Number of children: 0   Years of education: 12   Highest education level: Not on file  Occupational History   Occupation: CNA    Employer: [employer]   Occupation: Disabled  Tobacco Use   Smoking status: Never   Smokeless tobacco: Never  Vaping Use   Vaping status: Never Used  Substance and Sexual Activity  Alcohol use: No    Alcohol/week: 0.0 standard drinks of alcohol   Drug use: No   Sexual activity: Yes    Birth control/protection: Surgical    Comment: Hysterectomy  Other Topics Concern   Not on file  Social History Narrative   Current Social History 08/28/2019        Patient lives alone in a 7th floor apartment with elevator. There are not steps up to the entrance the patient uses.       Patient's method of transportation is via family member (mom or friend).      The highest level of education was high school diploma.      The patient currently disabled.      Identified important Relationships are "My mother" - Patient is caregiver for Mother      Pets : None       Interests / Fun: "TV, movies"       Receiving HD on Monday, Wednesday, Friday      Current Stressors: "People, my health"       Religious / Personal Beliefs: "Baptist"       L. Corinna Dickens, RN, BSN       Social Drivers of Health   Financial Resource Strain: Low Risk  (04/24/2022)   Overall Financial Resource Strain (CARDIA)    Difficulty of Paying Living Expenses: Not hard at all  Food Insecurity: No Food Insecurity (01/06/2024)   Hunger Vital Sign    Worried About Running Out of Food in the Last Year: Never  true    Ran Out of Food in the Last Year: Never true  Transportation Needs: No Transportation Needs (01/06/2024)   PRAPARE - Administrator, Civil Service (Medical): No    Lack of Transportation (Non-Medical): No  Physical Activity: Inactive (03/23/2024)   Exercise Vital Sign    Days of Exercise per Week: 0 days    Minutes of Exercise per Session: 0 min  Stress: Stress Concern Present (03/23/2024)   [employer] of Occupational Health - Occupational Stress Questionnaire    Feeling of Stress : To some extent  Social Connections: Moderately Isolated (03/23/2024)   Social Connection and Isolation Panel [NHANES]    Frequency of Communication with Friends and Family: More than three times a week    Frequency of Social Gatherings with Friends and Family: Never    Attends Religious Services: Never    Database administrator or Organizations: No    Attends Banker Meetings: Never    Marital Status: Living with partner  Intimate Partner Violence: Not At Risk (01/06/2024)   Humiliation, Afraid, Rape, and Kick questionnaire    Fear of Current or Ex-Partner: No    Emotionally Abused: No    Physically Abused: No    Sexually Abused: No    Family History  Problem Relation Age of Onset   Cancer Mother        lung   Kidney disease Mother    Lung cancer Maternal Uncle    Breast cancer Maternal Grandmother    Liver cancer Maternal Grandfather    Heart disease Brother    Depression Brother    Diabetes Father    Dementia Father    Colon cancer Neg Hx      Review of Systems  Constitutional: Negative.  Negative for chills and fever.  HENT: Negative.  Negative for congestion and sore throat.   Respiratory: Negative.  Negative for cough and shortness of breath.  Cardiovascular: Negative.  Negative for chest pain and palpitations.  Gastrointestinal:  Negative for abdominal pain, diarrhea, nausea and vomiting.  Genitourinary: Negative.  Negative for dysuria and  hematuria.  Skin: Negative.  Negative for rash.  Neurological: Negative.  Negative for dizziness and headaches.    Vitals:   03/28/24 1001  BP: 118/78  Pulse: 66  Temp: 98.9 F (37.2 C)  SpO2: 99%    Physical Exam Constitutional:      Appearance: Normal appearance.  HENT:     Head: Normocephalic.  Eyes:     Extraocular Movements: Extraocular movements intact.     Pupils: Pupils are equal, round, and reactive to light.  Cardiovascular:     Rate and Rhythm: Normal rate and regular rhythm.     Pulses: Normal pulses.     Heart sounds: Normal heart sounds.  Pulmonary:     Effort: Pulmonary effort is normal.     Breath sounds: Normal breath sounds.  Musculoskeletal:     Cervical back: No tenderness.     Comments: Left upper extremity: AV shunt in good condition with excellent thrill Upper arm near her surgical incision shows palpable tender hematoma No other significant findings. No sign of infection.  Lymphadenopathy:     Cervical: No cervical adenopathy.  Skin:    General: Skin is warm and dry.     Capillary Refill: Capillary refill takes less than 2 seconds.  Neurological:     General: No focal deficit present.     Mental Status: She is alert and oriented to person, place, and time.  Psychiatric:        Mood and Affect: Mood normal.        Behavior: Behavior normal.      ASSESSMENT & PLAN: A total of 43 minutes was spent with the patient and counseling/coordination of care regarding preparing for this visit, review of most recent office visit notes, review of multiple chronic medical conditions and their management, review of all medications, review of most recent bloodwork results, review of health maintenance items, education on nutrition, prognosis, documentation, and need for follow up.   Problem List Items Addressed This Visit       Cardiovascular and Mediastinum   Hypertension associated with diabetes (HCC) - Primary   BP Readings from Last 3 Encounters:   03/28/24 118/78  02/22/24 (!) 163/81  02/02/24 (!) 127/113  Well-controlled hypertension Continues diltiazem  240 mg daily Well-controlled diabetes Continues daily insulin  glargine 22 units Continues insulin  lispro as per sliding scale         Endocrine   Hyperlipidemia associated with type 2 diabetes mellitus (HCC)   Chronic stable conditions Continue atorvastatin  40 mg and Zetia  10 mg daily        Genitourinary   CKD (chronic kidney disease) stage 5, GFR less than 15 ml/min (HCC)   On hemodialysis.  No complications. Overall doing very well        Other   Contusion of left arm   With resultant hematoma.  Stable without complications. No infection.  No concerns.        Patient Instructions  Hematoma A hematoma is a collection of blood. A hematoma can happen: Under the skin. In an organ. In a body space. In a joint space. In other tissues. The blood can thicken (clot) to form a lump that you can see and feel. The lump is often hard and may become sore and tender. The lump can be very small or very big. Most  hematomas get better in a few days to weeks. However, some of these may be serious and need medical care. What are the causes? This condition is caused by: An injury. Blood that leaks under the skin. Problems from surgeries. Medical conditions that cause bleeding or bruising. What increases the risk? You are more likely to develop this condition if: You are an older adult. You use medicines that thin your blood. You use NSAIDs, such as ibuprofen , often for pain. You play contact sports. What are the signs or symptoms? Symptoms depend on where the hematoma is in your body. If the hematoma is under the skin, there is: A firm lump on the body. Pain and tenderness in the area. Bruising. The skin above the lump may be blue, dark blue, purple-red, or yellowish. If the hematoma is deep in the tissues or body spaces, there may be: Blood in the stomach. This  may cause pain in the belly (abdomen), weakness, passing out (fainting), and shortness of breath. Blood in the head. This may cause a headache, weakness, trouble speaking or understanding speech, or passing out. How is this treated? Treatment depends on the cause, size, and location of the hematoma. Treatment may include: Doing nothing. Many hematomas go away on their own without treatment. Surgery or close monitoring. This may be needed for large hematomas or hematomas that affect the body's organs. Medicines. These may be given if a medical condition caused the hematoma. Follow these instructions at home: Managing pain, stiffness, and swelling  If told, put ice on the injured area. To do this: Put ice in a plastic bag. Place a towel between your skin and the bag. Leave the ice on for 20 minutes, 2-3 times a day for the first two days. If your skin turns bright red, take off the ice right away to prevent skin damage. The risk of skin damage is higher if you cannot feel pain, heat, or cold. If told, put heat on the injured area. Do this as often as told by your doctor. Use the heat source that your doctor recommends, such as a moist heat pack or a heating pad. Place a towel between your skin and the heat source. Leave the heat on for 20-30 minutes. If your skin turns bright red, take off the heat right away to prevent burns. The risk of burns is higher if you cannot feel pain, heat, or cold. Raise the injured area above the level of your heart while you are sitting or lying down. Wrap the affected area with an elastic bandage, if told by your doctor. Do not wrap the bandage too tight. If your hematoma is on a leg or foot and is painful, your doctor may give you crutches. Use them as told by your doctor. General instructions Take over-the-counter and prescription medicines only as told by your doctor. Keep all follow-up visits. Your doctor may want to see how your hematoma is healing with  treatment. Contact a doctor if: You have a fever. The swelling or bruising gets worse. You start to get more hematomas. Your pain gets worse. Your pain is not getting better with medicine. The skin over the hematoma breaks or starts to bleed. Get help right away if: Your hematoma is in your chest or belly and you: Pass out. Feel weak. Become short of breath. You have a hematoma on your scalp that is caused by a fall or injury, and you: Have a headache that gets worse. Have trouble speaking or understanding speech.  Become less alert or you pass out. These symptoms may be an emergency. Get help right away. Call 911. Do not wait to see if the symptoms will go away. Do not drive yourself to the hospital This information is not intended to replace advice given to you by your health care provider. Make sure you discuss any questions you have with your health care provider. Document Revised: 05/18/2022 Document Reviewed: 05/18/2022 Elsevier Patient Education  2024 Elsevier Inc.     Maryagnes Small, MD [facility]

## 2024-03-29 DIAGNOSIS — E1122 Type 2 diabetes mellitus with diabetic chronic kidney disease: Secondary | ICD-10-CM | POA: Diagnosis not present

## 2024-03-29 DIAGNOSIS — N2581 Secondary hyperparathyroidism of renal origin: Secondary | ICD-10-CM | POA: Diagnosis not present

## 2024-03-29 DIAGNOSIS — T8249XA Other complication of vascular dialysis catheter, initial encounter: Secondary | ICD-10-CM | POA: Diagnosis not present

## 2024-03-29 DIAGNOSIS — D631 Anemia in chronic kidney disease: Secondary | ICD-10-CM | POA: Diagnosis not present

## 2024-03-29 DIAGNOSIS — Z992 Dependence on renal dialysis: Secondary | ICD-10-CM | POA: Diagnosis not present

## 2024-03-29 DIAGNOSIS — N186 End stage renal disease: Secondary | ICD-10-CM | POA: Diagnosis not present

## 2024-03-29 DIAGNOSIS — D509 Iron deficiency anemia, unspecified: Secondary | ICD-10-CM | POA: Diagnosis not present

## 2024-03-30 NOTE — Progress Notes (Signed)
 Complex Care Management Care Guide Note  03/30/2024 Name: Nancy [name] MRN: [id number] DOB: Dec 14, 1955  Nancy [name] is a 68 y.o. year old female who is a primary care patient of [name], Miguel Jose, MD and is actively engaged with the care management team. I reached out to [employer] by phone today to assist with re-scheduling  with the RN Case Manager.  Follow up plan: Unsuccessful telephone outreach attempt made. A HIPAA compliant phone message was left for the patient providing contact information and requesting a return call.  Kandis Ormond, CMA [place]  [facility], [facility] Guide Direct Dial : [phone]  Fax: [phone] Website: [email or web address]

## 2024-03-30 NOTE — Progress Notes (Signed)
 Complex Care Management Care Guide Note  03/30/2024 Name: [name] MRN: [id number] DOB: 05-03-1956  [name] is a 68 y.o. year old female who is a primary care patient of [name], Miguel Jose, MD and is actively engaged with the care management team. I reached out to [employer] by phone today to assist with re-scheduling  with the RN Case Manager.  Follow up plan: Telephone appointment with complex care management team member scheduled for:  04/11/2024  Kandis Ormond, CMA [place]  [facility], [facility] Guide Direct Dial : [phone]  Fax: [phone] Website: [email or web address]

## 2024-03-30 NOTE — Patient Outreach (Signed)
 Complex Care Management   Visit Note  03/30/2024  Name:  Cathi Hazan MRN: [id number] DOB: 11/14/1956  Situation: RNCM received message from care guide-unsuccessful outreach attempts to reschedule. Goals closed. Case closed.   Lindi Revering, RN, MSN, BSN, CCM [place]  [facility], Population Health Case Manager Phone: [phone]

## 2024-03-31 DIAGNOSIS — N186 End stage renal disease: Secondary | ICD-10-CM | POA: Diagnosis not present

## 2024-03-31 DIAGNOSIS — Z992 Dependence on renal dialysis: Secondary | ICD-10-CM | POA: Diagnosis not present

## 2024-03-31 DIAGNOSIS — D509 Iron deficiency anemia, unspecified: Secondary | ICD-10-CM | POA: Diagnosis not present

## 2024-03-31 DIAGNOSIS — T8249XA Other complication of vascular dialysis catheter, initial encounter: Secondary | ICD-10-CM | POA: Diagnosis not present

## 2024-03-31 DIAGNOSIS — D631 Anemia in chronic kidney disease: Secondary | ICD-10-CM | POA: Diagnosis not present

## 2024-03-31 DIAGNOSIS — E1122 Type 2 diabetes mellitus with diabetic chronic kidney disease: Secondary | ICD-10-CM | POA: Diagnosis not present

## 2024-03-31 DIAGNOSIS — N2581 Secondary hyperparathyroidism of renal origin: Secondary | ICD-10-CM | POA: Diagnosis not present

## 2024-04-03 DIAGNOSIS — D509 Iron deficiency anemia, unspecified: Secondary | ICD-10-CM | POA: Diagnosis not present

## 2024-04-03 DIAGNOSIS — N2581 Secondary hyperparathyroidism of renal origin: Secondary | ICD-10-CM | POA: Diagnosis not present

## 2024-04-03 DIAGNOSIS — E1122 Type 2 diabetes mellitus with diabetic chronic kidney disease: Secondary | ICD-10-CM | POA: Diagnosis not present

## 2024-04-03 DIAGNOSIS — D631 Anemia in chronic kidney disease: Secondary | ICD-10-CM | POA: Diagnosis not present

## 2024-04-03 DIAGNOSIS — Z992 Dependence on renal dialysis: Secondary | ICD-10-CM | POA: Diagnosis not present

## 2024-04-03 DIAGNOSIS — N186 End stage renal disease: Secondary | ICD-10-CM | POA: Diagnosis not present

## 2024-04-03 DIAGNOSIS — T8249XA Other complication of vascular dialysis catheter, initial encounter: Secondary | ICD-10-CM | POA: Diagnosis not present

## 2024-04-05 DIAGNOSIS — D509 Iron deficiency anemia, unspecified: Secondary | ICD-10-CM | POA: Diagnosis not present

## 2024-04-05 DIAGNOSIS — Z992 Dependence on renal dialysis: Secondary | ICD-10-CM | POA: Diagnosis not present

## 2024-04-05 DIAGNOSIS — E1122 Type 2 diabetes mellitus with diabetic chronic kidney disease: Secondary | ICD-10-CM | POA: Diagnosis not present

## 2024-04-05 DIAGNOSIS — N186 End stage renal disease: Secondary | ICD-10-CM | POA: Diagnosis not present

## 2024-04-05 DIAGNOSIS — N2581 Secondary hyperparathyroidism of renal origin: Secondary | ICD-10-CM | POA: Diagnosis not present

## 2024-04-05 DIAGNOSIS — T8249XA Other complication of vascular dialysis catheter, initial encounter: Secondary | ICD-10-CM | POA: Diagnosis not present

## 2024-04-05 DIAGNOSIS — D631 Anemia in chronic kidney disease: Secondary | ICD-10-CM | POA: Diagnosis not present

## 2024-04-07 DIAGNOSIS — N2581 Secondary hyperparathyroidism of renal origin: Secondary | ICD-10-CM | POA: Diagnosis not present

## 2024-04-07 DIAGNOSIS — Z23 Encounter for immunization: Secondary | ICD-10-CM | POA: Diagnosis not present

## 2024-04-07 DIAGNOSIS — N186 End stage renal disease: Secondary | ICD-10-CM | POA: Diagnosis not present

## 2024-04-07 DIAGNOSIS — E1122 Type 2 diabetes mellitus with diabetic chronic kidney disease: Secondary | ICD-10-CM | POA: Diagnosis not present

## 2024-04-07 DIAGNOSIS — D631 Anemia in chronic kidney disease: Secondary | ICD-10-CM | POA: Diagnosis not present

## 2024-04-07 DIAGNOSIS — Z992 Dependence on renal dialysis: Secondary | ICD-10-CM | POA: Diagnosis not present

## 2024-04-07 DIAGNOSIS — D509 Iron deficiency anemia, unspecified: Secondary | ICD-10-CM | POA: Diagnosis not present

## 2024-04-10 DIAGNOSIS — D631 Anemia in chronic kidney disease: Secondary | ICD-10-CM | POA: Diagnosis not present

## 2024-04-10 DIAGNOSIS — N186 End stage renal disease: Secondary | ICD-10-CM | POA: Diagnosis not present

## 2024-04-10 DIAGNOSIS — E1122 Type 2 diabetes mellitus with diabetic chronic kidney disease: Secondary | ICD-10-CM | POA: Diagnosis not present

## 2024-04-10 DIAGNOSIS — D509 Iron deficiency anemia, unspecified: Secondary | ICD-10-CM | POA: Diagnosis not present

## 2024-04-10 DIAGNOSIS — Z992 Dependence on renal dialysis: Secondary | ICD-10-CM | POA: Diagnosis not present

## 2024-04-10 DIAGNOSIS — N2581 Secondary hyperparathyroidism of renal origin: Secondary | ICD-10-CM | POA: Diagnosis not present

## 2024-04-10 DIAGNOSIS — Z23 Encounter for immunization: Secondary | ICD-10-CM | POA: Diagnosis not present

## 2024-04-11 ENCOUNTER — Telehealth: Payer: Self-pay

## 2024-04-12 DIAGNOSIS — N186 End stage renal disease: Secondary | ICD-10-CM | POA: Diagnosis not present

## 2024-04-12 DIAGNOSIS — E1122 Type 2 diabetes mellitus with diabetic chronic kidney disease: Secondary | ICD-10-CM | POA: Diagnosis not present

## 2024-04-12 DIAGNOSIS — N2581 Secondary hyperparathyroidism of renal origin: Secondary | ICD-10-CM | POA: Diagnosis not present

## 2024-04-12 DIAGNOSIS — Z992 Dependence on renal dialysis: Secondary | ICD-10-CM | POA: Diagnosis not present

## 2024-04-12 DIAGNOSIS — D631 Anemia in chronic kidney disease: Secondary | ICD-10-CM | POA: Diagnosis not present

## 2024-04-12 DIAGNOSIS — Z23 Encounter for immunization: Secondary | ICD-10-CM | POA: Diagnosis not present

## 2024-04-12 DIAGNOSIS — D509 Iron deficiency anemia, unspecified: Secondary | ICD-10-CM | POA: Diagnosis not present

## 2024-04-14 DIAGNOSIS — E1122 Type 2 diabetes mellitus with diabetic chronic kidney disease: Secondary | ICD-10-CM | POA: Diagnosis not present

## 2024-04-14 DIAGNOSIS — N186 End stage renal disease: Secondary | ICD-10-CM | POA: Diagnosis not present

## 2024-04-14 DIAGNOSIS — D509 Iron deficiency anemia, unspecified: Secondary | ICD-10-CM | POA: Diagnosis not present

## 2024-04-14 DIAGNOSIS — N2581 Secondary hyperparathyroidism of renal origin: Secondary | ICD-10-CM | POA: Diagnosis not present

## 2024-04-14 DIAGNOSIS — Z992 Dependence on renal dialysis: Secondary | ICD-10-CM | POA: Diagnosis not present

## 2024-04-14 DIAGNOSIS — Z23 Encounter for immunization: Secondary | ICD-10-CM | POA: Diagnosis not present

## 2024-04-14 DIAGNOSIS — D631 Anemia in chronic kidney disease: Secondary | ICD-10-CM | POA: Diagnosis not present

## 2024-04-17 ENCOUNTER — Other Ambulatory Visit: Payer: Self-pay | Admitting: Radiology

## 2024-04-17 ENCOUNTER — Telehealth: Payer: Self-pay | Admitting: Emergency Medicine

## 2024-04-17 DIAGNOSIS — E1122 Type 2 diabetes mellitus with diabetic chronic kidney disease: Secondary | ICD-10-CM | POA: Diagnosis not present

## 2024-04-17 DIAGNOSIS — N186 End stage renal disease: Secondary | ICD-10-CM | POA: Diagnosis not present

## 2024-04-17 DIAGNOSIS — Z23 Encounter for immunization: Secondary | ICD-10-CM | POA: Diagnosis not present

## 2024-04-17 DIAGNOSIS — N2581 Secondary hyperparathyroidism of renal origin: Secondary | ICD-10-CM | POA: Diagnosis not present

## 2024-04-17 DIAGNOSIS — Z992 Dependence on renal dialysis: Secondary | ICD-10-CM | POA: Diagnosis not present

## 2024-04-17 DIAGNOSIS — D631 Anemia in chronic kidney disease: Secondary | ICD-10-CM | POA: Diagnosis not present

## 2024-04-17 DIAGNOSIS — D509 Iron deficiency anemia, unspecified: Secondary | ICD-10-CM | POA: Diagnosis not present

## 2024-04-17 DIAGNOSIS — N185 Chronic kidney disease, stage 5: Secondary | ICD-10-CM

## 2024-04-17 NOTE — Telephone Encounter (Signed)
 Ok for referral?

## 2024-04-17 NOTE — Telephone Encounter (Signed)
 Copied from CRM [id number]. Topic: Referral - Question >> Apr 17, 2024  2:34 PM Adaysia C wrote: Reason for CRM: Patient has requested a referral to a hepatologist or a gastroenterologist to check her liver; please follow up with patient regarding this request [phone]

## 2024-04-17 NOTE — Telephone Encounter (Signed)
 Okay for referrals? Patient was told that her liver was "messed up" and was denied a liver transplant in  [place]. She wanted the referrals to make sure her liver and kidneys were okay

## 2024-04-19 DIAGNOSIS — N2581 Secondary hyperparathyroidism of renal origin: Secondary | ICD-10-CM | POA: Diagnosis not present

## 2024-04-19 DIAGNOSIS — D509 Iron deficiency anemia, unspecified: Secondary | ICD-10-CM | POA: Diagnosis not present

## 2024-04-19 DIAGNOSIS — E1122 Type 2 diabetes mellitus with diabetic chronic kidney disease: Secondary | ICD-10-CM | POA: Diagnosis not present

## 2024-04-19 DIAGNOSIS — Z992 Dependence on renal dialysis: Secondary | ICD-10-CM | POA: Diagnosis not present

## 2024-04-19 DIAGNOSIS — N186 End stage renal disease: Secondary | ICD-10-CM | POA: Diagnosis not present

## 2024-04-19 DIAGNOSIS — Z23 Encounter for immunization: Secondary | ICD-10-CM | POA: Diagnosis not present

## 2024-04-19 DIAGNOSIS — D631 Anemia in chronic kidney disease: Secondary | ICD-10-CM | POA: Diagnosis not present

## 2024-04-21 DIAGNOSIS — Z992 Dependence on renal dialysis: Secondary | ICD-10-CM | POA: Diagnosis not present

## 2024-04-21 DIAGNOSIS — E1122 Type 2 diabetes mellitus with diabetic chronic kidney disease: Secondary | ICD-10-CM | POA: Diagnosis not present

## 2024-04-21 DIAGNOSIS — Z23 Encounter for immunization: Secondary | ICD-10-CM | POA: Diagnosis not present

## 2024-04-21 DIAGNOSIS — N186 End stage renal disease: Secondary | ICD-10-CM | POA: Diagnosis not present

## 2024-04-21 DIAGNOSIS — D631 Anemia in chronic kidney disease: Secondary | ICD-10-CM | POA: Diagnosis not present

## 2024-04-21 DIAGNOSIS — N2581 Secondary hyperparathyroidism of renal origin: Secondary | ICD-10-CM | POA: Diagnosis not present

## 2024-04-21 DIAGNOSIS — D509 Iron deficiency anemia, unspecified: Secondary | ICD-10-CM | POA: Diagnosis not present

## 2024-04-26 DIAGNOSIS — D509 Iron deficiency anemia, unspecified: Secondary | ICD-10-CM | POA: Diagnosis not present

## 2024-04-26 DIAGNOSIS — Z23 Encounter for immunization: Secondary | ICD-10-CM | POA: Diagnosis not present

## 2024-04-26 DIAGNOSIS — N186 End stage renal disease: Secondary | ICD-10-CM | POA: Diagnosis not present

## 2024-04-26 DIAGNOSIS — Z992 Dependence on renal dialysis: Secondary | ICD-10-CM | POA: Diagnosis not present

## 2024-04-26 DIAGNOSIS — D631 Anemia in chronic kidney disease: Secondary | ICD-10-CM | POA: Diagnosis not present

## 2024-04-26 DIAGNOSIS — N2581 Secondary hyperparathyroidism of renal origin: Secondary | ICD-10-CM | POA: Diagnosis not present

## 2024-04-26 DIAGNOSIS — E1122 Type 2 diabetes mellitus with diabetic chronic kidney disease: Secondary | ICD-10-CM | POA: Diagnosis not present

## 2024-04-27 ENCOUNTER — Telehealth: Payer: Self-pay | Admitting: Emergency Medicine

## 2024-04-27 NOTE — Telephone Encounter (Signed)
 Copied from CRM [id number]. Topic: Clinical - Home Health Verbal Orders >> Apr 27, 2024 11:55 AM Baldomero Bone wrote: Caller/Agency: Jenette Mitchell with [facility] Callback Number: [phone] Service Requested: home health Frequency: Jenette Mitchell with [facility] patient was not able to pass mini mental exam; abnormal quantaflow screening. Needs further evaualtion for PAD. Callbacnu [phone] confidential VM Any new concerns about the patient? Yes

## 2024-04-27 NOTE — Telephone Encounter (Signed)
Okay for verbal orders as requested? 

## 2024-04-28 DIAGNOSIS — D509 Iron deficiency anemia, unspecified: Secondary | ICD-10-CM | POA: Diagnosis not present

## 2024-04-28 DIAGNOSIS — Z23 Encounter for immunization: Secondary | ICD-10-CM | POA: Diagnosis not present

## 2024-04-28 DIAGNOSIS — E1122 Type 2 diabetes mellitus with diabetic chronic kidney disease: Secondary | ICD-10-CM | POA: Diagnosis not present

## 2024-04-28 DIAGNOSIS — D631 Anemia in chronic kidney disease: Secondary | ICD-10-CM | POA: Diagnosis not present

## 2024-04-28 DIAGNOSIS — Z992 Dependence on renal dialysis: Secondary | ICD-10-CM | POA: Diagnosis not present

## 2024-04-28 DIAGNOSIS — N2581 Secondary hyperparathyroidism of renal origin: Secondary | ICD-10-CM | POA: Diagnosis not present

## 2024-04-28 DIAGNOSIS — N186 End stage renal disease: Secondary | ICD-10-CM | POA: Diagnosis not present

## 2024-04-28 NOTE — Telephone Encounter (Signed)
 Tried calling back for "okay" verbal orders no answer and unable to leave message voicemail box full

## 2024-05-01 DIAGNOSIS — Z992 Dependence on renal dialysis: Secondary | ICD-10-CM | POA: Diagnosis not present

## 2024-05-01 DIAGNOSIS — N2581 Secondary hyperparathyroidism of renal origin: Secondary | ICD-10-CM | POA: Diagnosis not present

## 2024-05-01 DIAGNOSIS — Z23 Encounter for immunization: Secondary | ICD-10-CM | POA: Diagnosis not present

## 2024-05-01 DIAGNOSIS — D631 Anemia in chronic kidney disease: Secondary | ICD-10-CM | POA: Diagnosis not present

## 2024-05-01 DIAGNOSIS — D509 Iron deficiency anemia, unspecified: Secondary | ICD-10-CM | POA: Diagnosis not present

## 2024-05-01 DIAGNOSIS — E1122 Type 2 diabetes mellitus with diabetic chronic kidney disease: Secondary | ICD-10-CM | POA: Diagnosis not present

## 2024-05-01 DIAGNOSIS — N186 End stage renal disease: Secondary | ICD-10-CM | POA: Diagnosis not present

## 2024-05-03 DIAGNOSIS — D631 Anemia in chronic kidney disease: Secondary | ICD-10-CM | POA: Diagnosis not present

## 2024-05-03 DIAGNOSIS — N2581 Secondary hyperparathyroidism of renal origin: Secondary | ICD-10-CM | POA: Diagnosis not present

## 2024-05-03 DIAGNOSIS — N186 End stage renal disease: Secondary | ICD-10-CM | POA: Diagnosis not present

## 2024-05-03 DIAGNOSIS — E1122 Type 2 diabetes mellitus with diabetic chronic kidney disease: Secondary | ICD-10-CM | POA: Diagnosis not present

## 2024-05-03 DIAGNOSIS — Z992 Dependence on renal dialysis: Secondary | ICD-10-CM | POA: Diagnosis not present

## 2024-05-03 DIAGNOSIS — D509 Iron deficiency anemia, unspecified: Secondary | ICD-10-CM | POA: Diagnosis not present

## 2024-05-03 DIAGNOSIS — Z23 Encounter for immunization: Secondary | ICD-10-CM | POA: Diagnosis not present

## 2024-05-05 DIAGNOSIS — Z23 Encounter for immunization: Secondary | ICD-10-CM | POA: Diagnosis not present

## 2024-05-05 DIAGNOSIS — N2581 Secondary hyperparathyroidism of renal origin: Secondary | ICD-10-CM | POA: Diagnosis not present

## 2024-05-05 DIAGNOSIS — Z992 Dependence on renal dialysis: Secondary | ICD-10-CM | POA: Diagnosis not present

## 2024-05-05 DIAGNOSIS — E1122 Type 2 diabetes mellitus with diabetic chronic kidney disease: Secondary | ICD-10-CM | POA: Diagnosis not present

## 2024-05-05 DIAGNOSIS — D509 Iron deficiency anemia, unspecified: Secondary | ICD-10-CM | POA: Diagnosis not present

## 2024-05-05 DIAGNOSIS — N186 End stage renal disease: Secondary | ICD-10-CM | POA: Diagnosis not present

## 2024-05-05 DIAGNOSIS — D631 Anemia in chronic kidney disease: Secondary | ICD-10-CM | POA: Diagnosis not present

## 2024-05-06 DIAGNOSIS — E1122 Type 2 diabetes mellitus with diabetic chronic kidney disease: Secondary | ICD-10-CM | POA: Diagnosis not present

## 2024-05-06 DIAGNOSIS — Z992 Dependence on renal dialysis: Secondary | ICD-10-CM | POA: Diagnosis not present

## 2024-05-06 DIAGNOSIS — N186 End stage renal disease: Secondary | ICD-10-CM | POA: Diagnosis not present

## 2024-05-08 DIAGNOSIS — D509 Iron deficiency anemia, unspecified: Secondary | ICD-10-CM | POA: Diagnosis not present

## 2024-05-08 DIAGNOSIS — E1122 Type 2 diabetes mellitus with diabetic chronic kidney disease: Secondary | ICD-10-CM | POA: Diagnosis not present

## 2024-05-08 DIAGNOSIS — Z992 Dependence on renal dialysis: Secondary | ICD-10-CM | POA: Diagnosis not present

## 2024-05-08 DIAGNOSIS — N186 End stage renal disease: Secondary | ICD-10-CM | POA: Diagnosis not present

## 2024-05-08 DIAGNOSIS — N2581 Secondary hyperparathyroidism of renal origin: Secondary | ICD-10-CM | POA: Diagnosis not present

## 2024-05-09 ENCOUNTER — Encounter: Payer: Self-pay | Admitting: Gastroenterology

## 2024-05-12 DIAGNOSIS — N2581 Secondary hyperparathyroidism of renal origin: Secondary | ICD-10-CM | POA: Diagnosis not present

## 2024-05-12 DIAGNOSIS — Z992 Dependence on renal dialysis: Secondary | ICD-10-CM | POA: Diagnosis not present

## 2024-05-12 DIAGNOSIS — N186 End stage renal disease: Secondary | ICD-10-CM | POA: Diagnosis not present

## 2024-05-12 DIAGNOSIS — D509 Iron deficiency anemia, unspecified: Secondary | ICD-10-CM | POA: Diagnosis not present

## 2024-05-12 DIAGNOSIS — E1122 Type 2 diabetes mellitus with diabetic chronic kidney disease: Secondary | ICD-10-CM | POA: Diagnosis not present

## 2024-05-15 DIAGNOSIS — Z992 Dependence on renal dialysis: Secondary | ICD-10-CM | POA: Diagnosis not present

## 2024-05-15 DIAGNOSIS — D509 Iron deficiency anemia, unspecified: Secondary | ICD-10-CM | POA: Diagnosis not present

## 2024-05-15 DIAGNOSIS — N186 End stage renal disease: Secondary | ICD-10-CM | POA: Diagnosis not present

## 2024-05-15 DIAGNOSIS — N2581 Secondary hyperparathyroidism of renal origin: Secondary | ICD-10-CM | POA: Diagnosis not present

## 2024-05-15 DIAGNOSIS — E1122 Type 2 diabetes mellitus with diabetic chronic kidney disease: Secondary | ICD-10-CM | POA: Diagnosis not present

## 2024-05-17 DIAGNOSIS — N186 End stage renal disease: Secondary | ICD-10-CM | POA: Diagnosis not present

## 2024-05-17 DIAGNOSIS — D509 Iron deficiency anemia, unspecified: Secondary | ICD-10-CM | POA: Diagnosis not present

## 2024-05-17 DIAGNOSIS — Z992 Dependence on renal dialysis: Secondary | ICD-10-CM | POA: Diagnosis not present

## 2024-05-17 DIAGNOSIS — N2581 Secondary hyperparathyroidism of renal origin: Secondary | ICD-10-CM | POA: Diagnosis not present

## 2024-05-17 DIAGNOSIS — E1122 Type 2 diabetes mellitus with diabetic chronic kidney disease: Secondary | ICD-10-CM | POA: Diagnosis not present

## 2024-05-18 ENCOUNTER — Telehealth: Payer: Self-pay | Admitting: Emergency Medicine

## 2024-05-18 NOTE — Telephone Encounter (Signed)
 Copied from CRM [id number]. Topic: Referral - Status >> May 18, 2024  1:43 PM Magdalene School wrote: Reason for CRM: Patient returning call she received and had a message asking her to call back regarding a referral. She is unsure which referral it is about but thinks it may be about liver referral and unsure of when she received the call. Please contact patient.

## 2024-05-18 NOTE — Telephone Encounter (Signed)
 LVM for patient to call back regarding her questions about a referral

## 2024-05-19 DIAGNOSIS — Z992 Dependence on renal dialysis: Secondary | ICD-10-CM | POA: Diagnosis not present

## 2024-05-19 DIAGNOSIS — D509 Iron deficiency anemia, unspecified: Secondary | ICD-10-CM | POA: Diagnosis not present

## 2024-05-19 DIAGNOSIS — N2581 Secondary hyperparathyroidism of renal origin: Secondary | ICD-10-CM | POA: Diagnosis not present

## 2024-05-19 DIAGNOSIS — N186 End stage renal disease: Secondary | ICD-10-CM | POA: Diagnosis not present

## 2024-05-19 DIAGNOSIS — E1122 Type 2 diabetes mellitus with diabetic chronic kidney disease: Secondary | ICD-10-CM | POA: Diagnosis not present

## 2024-05-24 DIAGNOSIS — E1122 Type 2 diabetes mellitus with diabetic chronic kidney disease: Secondary | ICD-10-CM | POA: Diagnosis not present

## 2024-05-24 DIAGNOSIS — N186 End stage renal disease: Secondary | ICD-10-CM | POA: Diagnosis not present

## 2024-05-24 DIAGNOSIS — D509 Iron deficiency anemia, unspecified: Secondary | ICD-10-CM | POA: Diagnosis not present

## 2024-05-24 DIAGNOSIS — N2581 Secondary hyperparathyroidism of renal origin: Secondary | ICD-10-CM | POA: Diagnosis not present

## 2024-05-24 DIAGNOSIS — Z992 Dependence on renal dialysis: Secondary | ICD-10-CM | POA: Diagnosis not present

## 2024-05-25 ENCOUNTER — Encounter (HOSPITAL_COMMUNITY)
Admission: RE | Disposition: A | Discharge status: Home / Self Care | Payer: Self-pay | Source: Home / Self Care | Attending: Vascular Surgery

## 2024-05-25 ENCOUNTER — Ambulatory Visit (HOSPITAL_COMMUNITY)
Admission: RE | Admit: 2024-05-25 | Discharge: 2024-05-25 | Disposition: A | Discharge status: Home / Self Care | Attending: Vascular Surgery | Admitting: Vascular Surgery

## 2024-05-25 ENCOUNTER — Other Ambulatory Visit: Payer: Self-pay

## 2024-05-25 DIAGNOSIS — Z736 Limitation of activities due to disability: Secondary | ICD-10-CM | POA: Insufficient documentation

## 2024-05-25 DIAGNOSIS — I120 Hypertensive chronic kidney disease with stage 5 chronic kidney disease or end stage renal disease: Secondary | ICD-10-CM | POA: Diagnosis not present

## 2024-05-25 DIAGNOSIS — E1122 Type 2 diabetes mellitus with diabetic chronic kidney disease: Secondary | ICD-10-CM | POA: Diagnosis not present

## 2024-05-25 DIAGNOSIS — Z992 Dependence on renal dialysis: Secondary | ICD-10-CM | POA: Diagnosis not present

## 2024-05-25 DIAGNOSIS — N186 End stage renal disease: Secondary | ICD-10-CM | POA: Diagnosis not present

## 2024-05-25 DIAGNOSIS — T82838A Hemorrhage of vascular prosthetic devices, implants and grafts, initial encounter: Secondary | ICD-10-CM

## 2024-05-25 DIAGNOSIS — T82858A Stenosis of vascular prosthetic devices, implants and grafts, initial encounter: Secondary | ICD-10-CM | POA: Diagnosis not present

## 2024-05-25 DIAGNOSIS — Y832 Surgical operation with anastomosis, bypass or graft as the cause of abnormal reaction of the patient, or of later complication, without mention of misadventure at the time of the procedure: Secondary | ICD-10-CM | POA: Insufficient documentation

## 2024-05-25 DIAGNOSIS — Z602 Problems related to living alone: Secondary | ICD-10-CM | POA: Insufficient documentation

## 2024-05-25 HISTORY — PX: VENOUS ANGIOPLASTY: CATH118376

## 2024-05-25 HISTORY — PX: A/V SHUNT INTERVENTION: CATH118220

## 2024-05-25 LAB — GLUCOSE, CAPILLARY: Glucose-Capillary: 176 mg/dL — ABNORMAL HIGH (ref 70–99)

## 2024-05-25 SURGERY — A/V SHUNT INTERVENTION
Anesthesia: LOCAL | Site: Arm Upper | Laterality: Left

## 2024-05-25 MED ORDER — LIDOCAINE HCL (PF) 1 % IJ SOLN
INTRAMUSCULAR | Status: DC | PRN
  Administered 2024-05-25: 2 mL via INTRADERMAL

## 2024-05-25 MED ORDER — HEPARIN (PORCINE) IN NACL 1000-0.9 UT/500ML-% IV SOLN
INTRAVENOUS | Status: DC | PRN
Start: 2024-05-25 — End: 2024-05-25
  Administered 2024-05-25: 500 mL

## 2024-05-25 MED ORDER — IODIXANOL 320 MG/ML IV SOLN
INTRAVENOUS | Status: DC | PRN
  Administered 2024-05-25: 20 mL

## 2024-05-25 SURGICAL SUPPLY — 10 items
BALLOON ATHLETIS 7.0X80X75 (BALLOONS) IMPLANT
BALLOON MUSTANG 8X60X135 (BALLOONS) IMPLANT
KIT ENCORE 26 ADVANTAGE (KITS) IMPLANT
KIT MICROPUNCTURE NIT STIFF (SHEATH) IMPLANT
KIT PV (KITS) ×2 IMPLANT
SHEATH PINNACLE R/O II 6F 4CM (SHEATH) IMPLANT
SHEATH PROBE COVER 6X72 (BAG) IMPLANT
TRAY PV CATH (CUSTOM PROCEDURE TRAY) ×2 IMPLANT
TUBING CIL FLEX 10 FLL-RA (TUBING) IMPLANT
WIRE BENTSON .035X145CM (WIRE) IMPLANT

## 2024-05-25 NOTE — Op Note (Signed)
 DATE OF SERVICE: 05/25/2024  PATIENT:  Nancy [name]  68 y.o. female  PRE-OPERATIVE DIAGNOSIS:  ESRD  POST-OPERATIVE DIAGNOSIS:  Same  PROCEDURE:   1) ultrasound guided left arm arteriovenous fistula access (CPT [phone]) 2) left arm fistulagram 3) left arm cephalic arch angioplasty - 7x31mm [employer] (CPT [phone])  SURGEON:  Surgeons and Role:    * Carlene Che, MD - Primary  ASSISTANT: none  ANESTHESIA:   local  EBL: minimal  BLOOD ADMINISTERED:none  DRAINS: none   LOCAL MEDICATIONS USED:  LIDOCAINE    SPECIMEN:  none  COUNTS: confirmed correct.  TOURNIQUET:  none  PATIENT DISPOSITION:  PACU - hemodynamically stable.   Delay start of Pharmacological VTE agent (>24hrs) due to surgical blood loss or risk of bleeding: no  INDICATION FOR PROCEDURE: Nancy [name] is a 68 y.o. female with ESRD. She has prolonged bleeding from her fistula after dialysis. After careful discussion of risks, benefits, and alternatives the patient was offered fistulagram. The patient understood and wished to proceed.  OPERATIVE FINDINGS: Fistula unremarkable from anastomosis through the upper arm.  There is some mild (approximately 50%) stenosis at the cephalic arch.  No subclavian vein stenosis.  No brachiocephalic stenosis.  No central venous stenosis.  DESCRIPTION OF PROCEDURE: After identification of the patient in the pre-operative holding area, the patient was transferred to the operating room. The patient was positioned supine on the operating room table. Anesthesia was induced. The left arm was prepped and draped in standard fashion. A surgical pause was performed confirming correct patient, procedure, and operative location.  Using duplex ultrasound, the left arm fistula was accessed with micropuncture technique.  Fistulogram was performed in stations.  See above for details.  Decision was made intervene.  The cephalic arch was crossed with a Bentson wire.  Access was upsized to 6 [place].   7 x 80 mm Atlas balloon was used to angioplasty the cephalic arch.  There was a waist seen at the confluence of cephalic arch and subclavian vein.  Good result was achieved.  Follow-up fistulogram showed improvement in flow.  Physical exam improved after angioplasty.  All endovascular equipment was removed.  A figure-of-eight stitch was applied to the access site rendering it hemostatic.  Upon completion of the case instrument and sharps counts were confirmed correct. The patient was transferred to the PACU in good condition. I was present for all portions of the procedure.  FOLLOW UP PLAN: Follow-up as needed.  Heber Little. Edgardo Goodwill, MD [facility] Phone Number: [phone] 05/25/2024 1:45 PM

## 2024-05-25 NOTE — H&P (Signed)
 [facility] [place]  ASSESSMENT / PLAN: 68 y.o. female with end-stage renal disease dialyzing Monday, Wednesday, and Friday through a left brachiocephalic AV fistula which has been revised in the past.  She has pulsatility in the fistula and prolonged bleeding after dialysis.  Plan fistulagram today with possible intervention.  CHIEF COMPLAINT: bleeding after HD  HISTORY OF PRESENT ILLNESS: Nancy [name] is a 68 y.o. female with ESRD on HD MWF via LUE BC AVF. There has been prolonged bleeding from the fistula after HD. She presents today for fistulogram with possible intervention.  All questions answered.  Past Medical History:  Diagnosis Date   Carpal tunnel syndrome, bilateral    confirmed on nerve conduction studies   Chronic hepatitis C (HCC)    considered cured 06/2016 post retroviral therapy    Chronic kidney disease    dialysis M-W-F   Diabetes mellitus type 2, uncontrolled    Diabetic peripheral neuropathy (HCC)    GERD (gastroesophageal reflux disease)    History of endometriosis    History of Helicobacter infection 10/2006   Hyperlipidemia    Hypertension    Liver fibrosis 02/17/2016   F2/3    Sleep apnea 09/2018   Results showed very mild OSA - no CPAP    Past Surgical History:  Procedure Laterality Date   ABDOMINAL HYSTERECTOMY     AV FISTULA PLACEMENT Left 10/22/2023   Procedure: LEFT ARM BRACHIOCEPHALIC ARTERIOVENOUS (AV) FISTULA CREATION;  Surgeon: Young Hensen, MD;  Location: [facility] OR;  Service: Vascular;  Laterality: Left;   CARPAL TUNNEL RELEASE Bilateral    COLONOSCOPY     EYE SURGERY Bilateral    cataracts removed   FISTULA SUPERFICIALIZATION Left 01/10/2024   Procedure: LEFT ARM FISTULA SUPERFICIALIZATION;  Surgeon: Young Hensen, MD;  Location: [facility] OR;  Service: Vascular;  Laterality: Left;   LIGATION OF COMPETING BRANCHES OF ARTERIOVENOUS FISTULA Left 01/10/2024   Procedure: REVISION OF ARTERIOVENOUS FISTULA WITH SIDE  BRANCH LIGATION;  Surgeon: Young Hensen, MD;  Location: [facility] OR;  Service: Vascular;  Laterality: Left;   REMOVAL OF A DIALYSIS CATHETER Right 02/02/2024   Procedure: DIALYSIS CATHETER EXCHANGE IN RIGHT INTERNAL JUGULAR;  Surgeon: Young Hensen, MD;  Location: [facility] OR;  Service: Vascular;  Laterality: Right;   UPPER GI ENDOSCOPY      Family History  Problem Relation Age of Onset   Cancer Mother        lung   Kidney disease Mother    Lung cancer Maternal Uncle    Breast cancer Maternal Grandmother    Liver cancer Maternal Grandfather    Heart disease Brother    Depression Brother    Diabetes Father    Dementia Father    Colon cancer Neg Hx     Social History   Socioeconomic History   Marital status: Single    Spouse name: Not on file   Number of children: 0   Years of education: 12   Highest education level: Not on file  Occupational History   Occupation: CNA    Employer: [employer]   Occupation: Disabled  Tobacco Use   Smoking status: Never   Smokeless tobacco: Never  Vaping Use   Vaping status: Never Used  Substance and Sexual Activity   Alcohol use: No    Alcohol/week: 0.0 standard drinks of alcohol   Drug use: No   Sexual activity: Yes    Birth control/protection: Surgical    Comment: Hysterectomy  Other Topics  Concern   Not on file  Social History Narrative   Current Social History 08/28/2019        Patient lives alone in a 7th floor apartment with elevator. There are not steps up to the entrance the patient uses.       Patient's method of transportation is via family member (mom or friend).      The highest level of education was high school diploma.      The patient currently disabled.      Identified important Relationships are My mother - Patient is caregiver for Mother      Pets : None       Interests / Fun: TV, movies       Receiving HD on Monday, Wednesday, Friday      Current Stressors: People, my health       Religious  / Personal Beliefs: Baptist       L. Corinna Dickens, RN, BSN       Social Drivers of Health   Financial Resource Strain: Low Risk  (04/24/2022)   Overall Financial Resource Strain (CARDIA)    Difficulty of Paying Living Expenses: Not hard at all  Food Insecurity: No Food Insecurity (01/06/2024)   Hunger Vital Sign    Worried About Running Out of Food in the Last Year: Never true    Ran Out of Food in the Last Year: Never true  Transportation Needs: No Transportation Needs (01/06/2024)   PRAPARE - Administrator, Civil Service (Medical): No    Lack of Transportation (Non-Medical): No  Physical Activity: Inactive (03/23/2024)   Exercise Vital Sign    Days of Exercise per Week: 0 days    Minutes of Exercise per Session: 0 min  Stress: Stress Concern Present (03/23/2024)   [employer] of Occupational Health - Occupational Stress Questionnaire    Feeling of Stress : To some extent  Social Connections: Moderately Isolated (03/23/2024)   Social Connection and Isolation Panel    Frequency of Communication with Friends and Family: More than three times a week    Frequency of Social Gatherings with Friends and Family: Never    Attends Religious Services: Never    Database administrator or Organizations: No    Attends Banker Meetings: Never    Marital Status: Living with partner  Intimate Partner Violence: Not At Risk (01/06/2024)   Humiliation, Afraid, Rape, and Kick questionnaire    Fear of Current or Ex-Partner: No    Emotionally Abused: No    Physically Abused: No    Sexually Abused: No    Allergies  Allergen Reactions   Ace Inhibitors Other (See Comments) and Cough    Persistent dry cough   Amitriptyline Hcl Nausea And Vomiting and Other (See Comments)    GI Intolerance/Upset stomach   Aspirin  Other (See Comments)    GI Intolerance   Latex Itching   Prednisone  Nausea And Vomiting    No current facility-administered medications for this encounter.     PHYSICAL EXAM Vitals:   05/25/24 1257 05/25/24 1312  BP: (!) 171/83 (!) 171/83  Pulse: 69 66  Resp: 12 16  Temp: 98.2 F (36.8 C)   TempSrc: Oral   SpO2: 94% 98%   Middle-age woman in no distress Regular rate and rhythm Unlabored breathing Left arm AV fistula with pulsatile thrill 2 areas of chronic cannulation  PERTINENT LABORATORY AND RADIOLOGIC DATA  Most recent CBC    Latest Ref Rng & Units  02/02/2024    7:54 AM 01/10/2024    6:15 AM 10/22/2023    6:51 AM  CBC  Hemoglobin 12.0 - 15.0 g/dL 16.1  09.6  04.5   Hematocrit 36.0 - 46.0 % 34.0  36.0  37.0      Most recent CMP    Latest Ref Rng & Units 02/02/2024    7:54 AM 01/10/2024    6:15 AM 10/22/2023    6:51 AM  CMP  Glucose 70 - 99 mg/dL 409  91  811   BUN 8 - 23 mg/dL 43  53  21   Creatinine 0.44 - 1.00 mg/dL 9.14  7.82  9.56   Sodium 135 - 145 mmol/L 137  143  139   Potassium 3.5 - 5.1 mmol/L 3.5  3.5  3.6   Chloride 98 - 111 mmol/L 100  108  102     Renal function CrCl cannot be calculated (Patient's most recent lab result is older than the maximum 21 days allowed.).  Hemoglobin A1C (%)  Date Value  08/16/2023 8.9 (A)   Hgb A1c MFr Bld (%)  Date Value  09/04/2010 10.0    LDL Chol Calc (NIH)  Date Value Ref Range Status  09/12/2019 67 0 - 99 mg/dL Final   LDL Cholesterol  Date Value Ref Range Status  05/14/2022 90 0 - 99 mg/dL Final    Heber Little. Edgardo Goodwill, MD FACS [facility] Phone Number: [phone] 05/25/2024 1:15 PM   Total time spent on preparing this encounter including chart review, data review, collecting history, examining the patient, and coordinating care: 30 minutes.   Portions of this report may have been transcribed using voice recognition software.  Every effort has been made to ensure accuracy; however, inadvertent computerized transcription errors may still be present.

## 2024-05-26 ENCOUNTER — Encounter (HOSPITAL_COMMUNITY): Payer: Self-pay | Admitting: Vascular Surgery

## 2024-05-26 DIAGNOSIS — D509 Iron deficiency anemia, unspecified: Secondary | ICD-10-CM | POA: Diagnosis not present

## 2024-05-26 DIAGNOSIS — Z992 Dependence on renal dialysis: Secondary | ICD-10-CM | POA: Diagnosis not present

## 2024-05-26 DIAGNOSIS — N2581 Secondary hyperparathyroidism of renal origin: Secondary | ICD-10-CM | POA: Diagnosis not present

## 2024-05-26 DIAGNOSIS — N186 End stage renal disease: Secondary | ICD-10-CM | POA: Diagnosis not present

## 2024-05-26 DIAGNOSIS — E1122 Type 2 diabetes mellitus with diabetic chronic kidney disease: Secondary | ICD-10-CM | POA: Diagnosis not present

## 2024-05-29 ENCOUNTER — Telehealth: Payer: Self-pay

## 2024-05-29 DIAGNOSIS — Z992 Dependence on renal dialysis: Secondary | ICD-10-CM | POA: Diagnosis not present

## 2024-05-29 DIAGNOSIS — N2581 Secondary hyperparathyroidism of renal origin: Secondary | ICD-10-CM | POA: Diagnosis not present

## 2024-05-29 DIAGNOSIS — N186 End stage renal disease: Secondary | ICD-10-CM | POA: Diagnosis not present

## 2024-05-29 DIAGNOSIS — D509 Iron deficiency anemia, unspecified: Secondary | ICD-10-CM | POA: Diagnosis not present

## 2024-05-29 DIAGNOSIS — E1122 Type 2 diabetes mellitus with diabetic chronic kidney disease: Secondary | ICD-10-CM | POA: Diagnosis not present

## 2024-05-30 ENCOUNTER — Telehealth: Payer: Self-pay

## 2024-05-31 DIAGNOSIS — Z992 Dependence on renal dialysis: Secondary | ICD-10-CM | POA: Diagnosis not present

## 2024-05-31 DIAGNOSIS — N186 End stage renal disease: Secondary | ICD-10-CM | POA: Diagnosis not present

## 2024-05-31 DIAGNOSIS — D509 Iron deficiency anemia, unspecified: Secondary | ICD-10-CM | POA: Diagnosis not present

## 2024-05-31 DIAGNOSIS — N2581 Secondary hyperparathyroidism of renal origin: Secondary | ICD-10-CM | POA: Diagnosis not present

## 2024-05-31 DIAGNOSIS — E1122 Type 2 diabetes mellitus with diabetic chronic kidney disease: Secondary | ICD-10-CM | POA: Diagnosis not present

## 2024-06-02 DIAGNOSIS — Z992 Dependence on renal dialysis: Secondary | ICD-10-CM | POA: Diagnosis not present

## 2024-06-02 DIAGNOSIS — D509 Iron deficiency anemia, unspecified: Secondary | ICD-10-CM | POA: Diagnosis not present

## 2024-06-02 DIAGNOSIS — E1122 Type 2 diabetes mellitus with diabetic chronic kidney disease: Secondary | ICD-10-CM | POA: Diagnosis not present

## 2024-06-02 DIAGNOSIS — N2581 Secondary hyperparathyroidism of renal origin: Secondary | ICD-10-CM | POA: Diagnosis not present

## 2024-06-02 DIAGNOSIS — N186 End stage renal disease: Secondary | ICD-10-CM | POA: Diagnosis not present

## 2024-06-05 DIAGNOSIS — Z992 Dependence on renal dialysis: Secondary | ICD-10-CM | POA: Diagnosis not present

## 2024-06-05 DIAGNOSIS — E1122 Type 2 diabetes mellitus with diabetic chronic kidney disease: Secondary | ICD-10-CM | POA: Diagnosis not present

## 2024-06-05 DIAGNOSIS — N186 End stage renal disease: Secondary | ICD-10-CM | POA: Diagnosis not present

## 2024-06-27 ENCOUNTER — Other Ambulatory Visit: Payer: Self-pay | Admitting: Emergency Medicine

## 2024-06-27 MED ORDER — CYCLOBENZAPRINE HCL 10 MG PO TABS: 10.0000 mg | ORAL_TABLET | Freq: Every day | ORAL | 3 refills | Status: AC

## 2024-07-04 NOTE — Procedures (Signed)
Result scanned to media

## 2024-07-06 ENCOUNTER — Ambulatory Visit: Admitting: Gastroenterology

## 2024-07-14 ENCOUNTER — Other Ambulatory Visit: Payer: Self-pay | Admitting: Emergency Medicine

## 2024-07-14 DIAGNOSIS — R1030 Lower abdominal pain, unspecified: Secondary | ICD-10-CM

## 2024-07-15 ENCOUNTER — Other Ambulatory Visit: Payer: Self-pay | Admitting: Emergency Medicine

## 2024-07-27 ENCOUNTER — Ambulatory Visit

## 2024-08-02 ENCOUNTER — Telehealth: Payer: Self-pay

## 2024-08-02 NOTE — Patient Outreach (Signed)
 Complex Care Management   Visit Note  08/02/2024  Name:  Nancy [name] MRN: [id number] DOB: 1956/07/05  Situation: RNCM called patient to see if patient has any care management/resource needs at this time. Patient states she is currently in Dialysis and request to schedule a time to complete telephone assessment.  Background:   Past Medical History:  Diagnosis Date   Carpal tunnel syndrome, bilateral    confirmed on nerve conduction studies   Chronic hepatitis C (HCC)    considered cured 06/2016 post retroviral therapy    Chronic kidney disease    dialysis M-W-F   Diabetes mellitus type 2, uncontrolled    Diabetic peripheral neuropathy (HCC)    GERD (gastroesophageal reflux disease)    History of endometriosis    History of Helicobacter infection 10/2006   Hyperlipidemia    Hypertension    Liver fibrosis 02/17/2016   F2/3    Sleep apnea 09/2018   Results showed very mild OSA - no CPAP    Follow Up Plan:   Telephone follow up appointment date/time:  08/17/24 at 10:30 am  Heddy Shutter, RN, MSN, BSN, CCM [place]  [facility], Population Health Case Manager Phone: [phone]

## 2024-08-17 ENCOUNTER — Other Ambulatory Visit: Payer: Self-pay

## 2024-08-17 NOTE — Patient Outreach (Unsigned)
 Complex Care Management   Visit Note  08/18/2024  Name:  Nancy [name] MRN: [id number] DOB: 07-09-1956  Situation: Referral received for Complex Care Management related to Diabetes with Complications I obtained verbal consent from Patient.  Visit completed with Patient  on the phone  Background:   Past Medical History:  Diagnosis Date   Carpal tunnel syndrome, bilateral    confirmed on nerve conduction studies   Chronic hepatitis C (HCC)    considered cured 06/2016 post retroviral therapy    Chronic kidney disease    dialysis M-W-F   Diabetes mellitus type 2, uncontrolled    Diabetic peripheral neuropathy (HCC)    GERD (gastroesophageal reflux disease)    History of endometriosis    History of Helicobacter infection 10/2006   Hyperlipidemia    Hypertension    Liver fibrosis 02/17/2016   F2/3    Sleep apnea 09/2018   Results showed very mild OSA - no CPAP    Assessment: Patient Reported Symptoms:  Cognitive Cognitive Status: No symptoms reported, Alert and oriented to person, place, and time      Neurological Neurological Review of Symptoms: No symptoms reported    HEENT HEENT Symptoms Reported: No symptoms reported      Cardiovascular Cardiovascular Symptoms Reported: No symptoms reported    Respiratory Respiratory Symptoms Reported: No symptoms reported    Endocrine Endocrine Symptoms Reported: No symptoms reported Is patient diabetic?: Yes Is patient checking blood sugars at home?: Yes (BS fasting on yesterday 195.has not checked this morning. checks 3-4x times/day) List most recent blood sugar readings, include date and time of day: range 180-190 Endocrine Self-Management Outcome: 3 (uncertain)  Gastrointestinal Gastrointestinal Symptoms Reported: No symptoms reported      Genitourinary Genitourinary Symptoms Reported: No symptoms reported Additional Genitourinary Details: HD three times/week. patient reports she is being evaluated to be on the kidney  transplant list Genitourinary Management Strategies: Hemodialysis Hemodialysis Schedule: Monday, Wednesday and Friday Hemodialysis Last Treatment: 08/16/24 Genitourinary Self-Management Outcome: 4 (good)  Integumentary Integumentary Symptoms Reported: No symptoms reported    Musculoskeletal Musculoskelatal Symptoms Reviewed: No symptoms reported Additional Musculoskeletal Details: does not need equipment, but states cannot walk far because legs will hurt        Psychosocial       Quality of Family Relationships: supportive, involved Do you feel physically threatened by others?: No    08/18/2024    PHQ2-9 Depression Screening   Little interest or pleasure in doing things Several days  Feeling down, depressed, or hopeless Several days  PHQ-2 - Total Score 2  Trouble falling or staying asleep, or sleeping too much Several days  Feeling tired or having little energy Nearly every day  Poor appetite or overeating  Several days  Feeling bad about yourself - or that you are a failure or have let yourself or your family down Several days  Trouble concentrating on things, such as reading the newspaper or watching television Not at all  Moving or speaking so slowly that other people could have noticed.  Or the opposite - being so fidgety or restless that you have been moving around a lot more than usual Not at all  Thoughts that you would be better off dead, or hurting yourself in some way Not at all  PHQ2-9 Total Score 8  If you checked off any problems, how difficult have these problems made it for you to do your work, take care of things at home, or get along with other people Somewhat difficult  Depression Interventions/Treatment Patient refuses Treatment    There were no vitals filed for this visit.  Medications Reviewed Today     Reviewed by Cyndie Woodbeck M, RN (Registered Nurse) on 08/17/24 at 1109  Med List Status: <None>   Medication Order Taking? Sig Documenting Provider  Last Dose Status Informant  Accu-Chek FastClix Lancets MISC [id number]  Use to check blood sugar up to 3 times a day Purcell Emil Schanz, MD  Active   ACCU-CHEK GUIDE test strip [id number]  CHECK BLOOD SUGAR 3 TIMES A DAY Purcell Emil Schanz, MD  Active Self  atorvastatin  (LIPITOR) 40 MG tablet [id number] Yes Take 1 tablet (40 mg total) by mouth daily. [name], Miguel Jose, MD  Active   Blood Glucose Monitoring Suppl (ACCU-CHEK GUIDE) w/Device KIT [id number]  1 each by Does not apply route 3 (three) times daily. Check blood sugar 3 times a day Arrien, Elidia Sieving, MD  Active Self  Blood Pressure Monitoring (ADVANCED ONE STEP BP MONITOR) MISC [id number]  1 Units by Does not apply route daily. Santo Stanly LABOR, MD  Active Self  Cholecalciferol (VITAMIN D3) 25 MCG (1000 UT) CAPS [id number] Yes Take 1 capsule (1,000 Units total) by mouth daily. [name], Miguel Jose, MD  Active   cyclobenzaprine  (FLEXERIL ) 10 MG tablet [id number] Yes Take 1 tablet (10 mg total) by mouth at bedtime. [name], Miguel Jose, MD  Active   dicyclomine  (BENTYL ) 20 MG tablet [id number] Yes TAKE 1 TABLET(20 MG) BY MOUTH EVERY 6 HOURS AS NEEDED FOR SPASMS [name], Emil Schanz, MD  Active   diltiazem  (CARDIZEM  CD) 240 MG 24 hr capsule [id number] Yes Take 1 capsule (240 mg total) by mouth daily. [name], Miguel Jose, MD  Active   ezetimibe  (ZETIA ) 10 MG tablet [id number] Yes Take 1 tablet (10 mg total) by mouth daily. [name], Miguel Jose, MD  Active   Glucagon  (GVOKE HYPOPEN  2-PACK) 0.5 MG/0.1ML EMMANUEL [id number] Yes Inject 0.5 mg into the skin as needed. [name], Miguel Jose, MD  Active   insulin  lispro (HUMALOG ) 100 UNIT/ML KwikPen [id number] Yes For sugar 120 to 150 use one unit, for 151 to 200 use two units, for 201 to 250 use three units, for 251 to 300 use five units, for 301 to 350 use seven units, for 351 to 400 use 9 units, if sugar more than 400 call your primary care provider. Arrien, Elidia Sieving, MD  Active Self   Insulin  Pen Needle (PEN NEEDLES) 31G X 5 MM MISC [id number]  1 each by Does not apply route 3 (three) times daily with meals. May substitute to any manufacturer covered by patient's insurance. Arrien, Elidia Sieving, MD  Active Self  Insulin  Syringe-Needle U-100 31G X 15/64 0.5 ML MISC [id number]  Use to inject insulin  one time a day Arrien, Elidia Sieving, MD  Active Self  Insulin  Syringes, Disposable, U-100 1 ML MISC [id number]  2 Syringes by Does not apply route daily. Use to inject insulin  into the skin 1 time daily. diag code E11.9. Insulin  dependent Arrien, Elidia Sieving, MD  Active Self  LANTUS  100 UNIT/ML injection [id number] Yes ADMINISTER 22 UNITS UNDER THE SKIN DAILY [name], Miguel Jose, MD  Active   omeprazole  (PRILOSEC) 40 MG capsule [id number] Yes TAKE 1 CAPSULE BY MOUTH EVERY DAY Purcell Emil Schanz, MD  Active Self  oxyCODONE -acetaminophen  (PERCOCET/ROXICET) 5-325 MG tablet [id number] Yes Take 1 tablet by mouth every 6 (six) hours as needed. Gerome Herring M, PA-C  Active   senna (SENOKOT) 8.6 MG tablet [id number] Yes Take  1 tablet (8.6 mg total) by mouth as needed for constipation. Jinwala, Sagar H, MD  Active Self  VELPHORO 500 MG chewable tablet [id number] Yes Chew 500 mg by mouth 3 (three) times daily. [provider]  Active           Recommendation:   PCP Follow-up  Follow Up Plan:   Telephone follow up appointment date/time:  08/29/24 at 11:30 am  Heddy Shutter, RN, MSN, BSN, CCM [place]  [facility], Population Health Case Manager Phone: [phone]

## 2024-08-18 NOTE — Patient Instructions (Signed)
 Visit Information  Thank you for taking time to visit with me today. Please don't hesitate to contact me if I can be of assistance to you before our next scheduled appointment.  Our next appointment is by telephone on 08/29/24 at 11:00 am Please call the care guide team at [phone] if you need to cancel or reschedule your appointment.   Following is a copy of your care plan:   Goals Addressed             This Visit's Progress    VBCI RN Care Plan       Problems:  Chronic Disease Management support and education needs related to DMII and ESRD Needs to follow up with Dentist. Patient states she will call to schedule an appointment with her dentist Needs to follow up with eye doctor. Patient states she will call to schedule an appointment with her eye doctor  Goal: Over the next 90 days the Patient will continue to work with RN Care Manager and/or Social Worker to address care management and care coordination needs related to DMII and ESRD as evidenced by adherence to care management team scheduled appointments     Patient will verbalize plan of care for management of ESRD as evidence by patient report and/or review of chart  Interventions:   Diabetes Interventions: Assessed patient's understanding of A1c goal: <8% Reviewed medications with patient and discussed importance of medication adherence Counseled on importance of regular laboratory monitoring as prescribed Discussed plans with patient for ongoing care management follow up and provided patient with direct contact information for care management team Assessed social determinant of health barriers Lab Results  Component Value Date   HGBA1C 8.9 (A) 08/16/2023   ESRD Interventions: Evaluation of patient of current treatment plan for health condition and patient's adherance to plan Reviewed upcoming appointment with [facility] re: kidney transplant evaluation] Discussed importance of managing Blood sugars and eating  healthy  Patient Self-Care Activities:  Attend all scheduled provider appointments Call pharmacy for medication refills 3-7 days in advance of running out of medications Call provider office for new concerns or questions  Take medications as prescribed   Attend HD sessions as prescribed  Plan:  Telephone follow up appointment with care management team member scheduled for:  08/29/24 at 11:00 am           Please call the Suicide and Crisis Lifeline: 988 call the [place]  National Suicide Prevention Lifeline: 517-778-0909 or TTY: (782) 700-7197 TTY 913-666-2556) to talk to a trained counselor call 1-800-273-TALK (toll free, 24 hour hotline) if you are experiencing a Mental Health or Behavioral Health Crisis or need someone to talk to.  Patient verbalizes understanding of instructions and care plan provided today and agrees to view in MyChart. Active MyChart status and patient understanding of how to access instructions and care plan via MyChart confirmed with patient.     Heddy Shutter, RN, MSN, BSN, CCM [place]  [facility], Population Health Case Manager Phone: [phone]

## 2024-08-29 ENCOUNTER — Other Ambulatory Visit: Payer: Self-pay

## 2024-08-29 NOTE — Patient Instructions (Signed)
 Visit Information  Thank you for taking time to visit with me today. Please don't hesitate to contact me if I can be of assistance to you before our next scheduled appointment.  Your next care management appointment is by telephone on 09/26/24 at 11:30 am   Please call the care guide team at [phone] if you need to cancel, schedule, or reschedule an appointment.   Please call the Suicide and Crisis Lifeline: 988 call the [place]  National Suicide Prevention Lifeline: (207)869-3351 or TTY: 5042726328 TTY 347-096-3784) to talk to a trained counselor if you are experiencing a Mental Health or Behavioral Health Crisis or need someone to talk to.  Heddy Shutter, RN, MSN, BSN, CCM [place]  [facility], Population Health Case Manager Phone: [phone]

## 2024-08-29 NOTE — Patient Outreach (Signed)
 Complex Care Management   Visit Note  08/29/2024  Name:  Nancy [name] MRN: [id number] DOB: [birth year]  Situation: Referral received for Complex Care Management related to ESRD I obtained verbal consent from Patient.  Visit completed with Patient  on the phone  Background:   Past Medical History:  Diagnosis Date   Carpal tunnel syndrome, bilateral    confirmed on nerve conduction studies   Chronic hepatitis C (HCC)    considered cured 06/2016 post retroviral therapy    Chronic kidney disease    dialysis M-W-F   Diabetes mellitus type 2, uncontrolled    Diabetic peripheral neuropathy (HCC)    GERD (gastroesophageal reflux disease)    History of endometriosis    History of Helicobacter infection 10/2006   Hyperlipidemia    Hypertension    Liver fibrosis 02/17/2016   F2/3    Sleep apnea 09/2018   Results showed very mild OSA - no CPAP    Assessment: Patient Reported Symptoms:  Cognitive Cognitive Status: No symptoms reported      Neurological Neurological Review of Symptoms: No symptoms reported    HEENT HEENT Symptoms Reported: No symptoms reported      Cardiovascular Cardiovascular Symptoms Reported: No symptoms reported    Respiratory Respiratory Symptoms Reported: No symptoms reported    Endocrine Endocrine Symptoms Reported: No symptoms reported Is patient diabetic?: Yes Is patient checking blood sugars at home?: Yes List most recent blood sugar readings, include date and time of day: patient reports has not checked BS today. But states yesterday BS 195. But states she got up during the night and drank orange juice.    Gastrointestinal Gastrointestinal Symptoms Reported: No symptoms reported      Genitourinary Genitourinary Symptoms Reported: No symptoms reported Additional Genitourinary Details: continues HD three times/week    Integumentary Integumentary Symptoms Reported: No symptoms reported    Musculoskeletal Musculoskelatal Symptoms Reviewed: No  symptoms reported        Psychosocial Psychosocial Symptoms Reported: No symptoms reported         There were no vitals filed for this visit.  Medications Reviewed Today     Reviewed by Noelene Gang M, RN (Registered Nurse) on 08/29/24 at 1147  Med List Status: <None>   Medication Order Taking? Sig Documenting Provider Last Dose Status Informant  Accu-Chek FastClix Lancets MISC [id number]  Use to check blood sugar up to 3 times a day Purcell Emil Schanz, MD  Active   ACCU-CHEK GUIDE test strip [id number]  CHECK BLOOD SUGAR 3 TIMES A DAY Purcell Emil Schanz, MD  Active Self  atorvastatin  (LIPITOR) 40 MG tablet [id number] Yes Take 1 tablet (40 mg total) by mouth daily. [name], Miguel Jose, MD  Active   Blood Glucose Monitoring Suppl (ACCU-CHEK GUIDE) w/Device KIT [id number]  1 each by Does not apply route 3 (three) times daily. Check blood sugar 3 times a day Arrien, Elidia Sieving, MD  Active Self  Blood Pressure Monitoring (ADVANCED ONE STEP BP MONITOR) MISC [id number]  1 Units by Does not apply route daily. Santo Stanly LABOR, MD  Active Self  Cholecalciferol (VITAMIN D3) 25 MCG (1000 UT) CAPS [id number] Yes Take 1 capsule (1,000 Units total) by mouth daily. [name], Miguel Jose, MD  Active   cyclobenzaprine  (FLEXERIL ) 10 MG tablet [id number] Yes Take 1 tablet (10 mg total) by mouth at bedtime. [name], Miguel Jose, MD  Active   dicyclomine  (BENTYL ) 20 MG tablet [id number] Yes TAKE 1 TABLET(20 MG) BY MOUTH EVERY 6 HOURS AS NEEDED FOR  SPASMS [name], Miguel Jose, MD  Active   diltiazem  (CARDIZEM  CD) 240 MG 24 hr capsule [id number] Yes Take 1 capsule (240 mg total) by mouth daily. [name], Miguel Jose, MD  Active   ezetimibe  (ZETIA ) 10 MG tablet [id number] Yes Take 1 tablet (10 mg total) by mouth daily. [name], Miguel Jose, MD  Active   Glucagon  (GVOKE HYPOPEN  2-PACK) 0.5 MG/0.1ML EMMANUEL [id number]  Inject 0.5 mg into the skin as needed. [name], Miguel Jose, MD  Active   insulin   lispro (HUMALOG ) 100 UNIT/ML KwikPen [id number] Yes For sugar 120 to 150 use one unit, for 151 to 200 use two units, for 201 to 250 use three units, for 251 to 300 use five units, for 301 to 350 use seven units, for 351 to 400 use 9 units, if sugar more than 400 call your primary care provider. Arrien, Elidia Sieving, MD  Active Self  Insulin  Pen Needle (PEN NEEDLES) 31G X 5 MM MISC [id number]  1 each by Does not apply route 3 (three) times daily with meals. May substitute to any manufacturer covered by patient's insurance. Arrien, Elidia Sieving, MD  Active Self  Insulin  Syringe-Needle U-100 31G X 15/64 0.5 ML MISC [id number]  Use to inject insulin  one time a day Arrien, Elidia Sieving, MD  Active Self  Insulin  Syringes, Disposable, U-100 1 ML MISC [id number]  2 Syringes by Does not apply route daily. Use to inject insulin  into the skin 1 time daily. diag code E11.9. Insulin  dependent Arrien, Elidia Sieving, MD  Active Self  LANTUS  100 UNIT/ML injection [id number] Yes ADMINISTER 22 UNITS UNDER THE SKIN DAILY Purcell Emil Schanz, MD  Active   omeprazole  (PRILOSEC) 40 MG capsule [id number] Yes TAKE 1 CAPSULE BY MOUTH EVERY DAY Purcell Emil Schanz, MD  Active Self  oxyCODONE -acetaminophen  (PERCOCET/ROXICET) 5-325 MG tablet [id number] Yes Take 1 tablet by mouth every 6 (six) hours as needed. Gerome Herring M, PA-C  Active   senna (SENOKOT) 8.6 MG tablet [id number] Yes Take 1 tablet (8.6 mg total) by mouth as needed for constipation. Jinwala, Sagar H, MD  Active Self  VELPHORO 500 MG chewable tablet [id number] Yes Chew 500 mg by mouth 3 (three) times daily. [provider]  Active           Recommendation:   Continue Current Plan of Care  Follow Up Plan:   Telephone follow up appointment date/time:  09/26/24 at 11:30 am  Heddy Shutter, RN, MSN, BSN, CCM [place]  [facility], Population Health Case Manager Phone: [phone]

## 2024-09-06 ENCOUNTER — Encounter: Payer: Self-pay | Admitting: Gastroenterology

## 2024-09-06 ENCOUNTER — Ambulatory Visit: Admitting: Gastroenterology

## 2024-09-06 VITALS — BP 110/80 | HR 66 | Ht <= 58 in | Wt 142.0 lb

## 2024-09-06 DIAGNOSIS — Z1211 Encounter for screening for malignant neoplasm of colon: Secondary | ICD-10-CM

## 2024-09-06 DIAGNOSIS — Z992 Dependence on renal dialysis: Secondary | ICD-10-CM | POA: Insufficient documentation

## 2024-09-06 DIAGNOSIS — N186 End stage renal disease: Secondary | ICD-10-CM

## 2024-09-06 MED ORDER — NA SULFATE-K SULFATE-MG SULF 17.5-3.13-1.6 GM/177ML PO SOLN: 1.0000 | Freq: Once | ORAL | 0 refills | Status: AC

## 2024-09-06 NOTE — Progress Notes (Signed)
 09/06/2024 Nancy [name] [id number] Apr 13, 1956   HISTORY OF PRESENT ILLNESS:  This is a 68 year old female who was previously a patient of Dr. Teressa.  She has end-stage renal disease, on dialysis Monday Wednesday and Friday.  She is being evaluated for kidney transplant at [facility].  Her last colonoscopy was in 2015 so she needs updated colonoscopy as part of her pretransplant evaluation.  She tells me that since being on dialysis and the binders she has been moving her bowels regularly.  Previously she had issues with constipation.  No rectal bleeding.  She does report chronic abdominal bloating and upper abdominal pain that she describes as muscle spasms during dialysis.   Past Medical History:  Diagnosis Date   Carpal tunnel syndrome, bilateral    confirmed on nerve conduction studies   Chronic hepatitis C (HCC)    considered cured 06/2016 post retroviral therapy    Chronic kidney disease    dialysis M-W-F   Diabetes mellitus type 2, uncontrolled    Diabetic peripheral neuropathy (HCC)    GERD (gastroesophageal reflux disease)    History of endometriosis    History of Helicobacter infection 10/2006   Hyperlipidemia    Hypertension    Liver fibrosis 02/17/2016   F2/3    Sleep apnea 09/2018   Results showed very mild OSA - no CPAP   Past Surgical History:  Procedure Laterality Date   A/V SHUNT INTERVENTION Left 05/25/2024   Procedure: A/V SHUNT INTERVENTION;  Surgeon: Magda Debby SAILOR, MD;  Location: [facility];  Service: Cardiovascular;  Laterality: Left;   ABDOMINAL HYSTERECTOMY     AV FISTULA PLACEMENT Left 10/22/2023   Procedure: LEFT ARM BRACHIOCEPHALIC ARTERIOVENOUS (AV) FISTULA CREATION;  Surgeon: Gretta Lonni PARAS, MD;  Location: [facility];  Service: Vascular;  Laterality: Left;   CARPAL TUNNEL RELEASE Bilateral    COLONOSCOPY     EYE SURGERY Bilateral    cataracts removed   FISTULA SUPERFICIALIZATION Left 01/10/2024   Procedure: LEFT ARM FISTULA SUPERFICIALIZATION;   Surgeon: Gretta Lonni PARAS, MD;  Location: [facility] OR;  Service: Vascular;  Laterality: Left;   LIGATION OF COMPETING BRANCHES OF ARTERIOVENOUS FISTULA Left 01/10/2024   Procedure: REVISION OF ARTERIOVENOUS FISTULA WITH SIDE BRANCH LIGATION;  Surgeon: Gretta Lonni PARAS, MD;  Location: [facility] OR;  Service: Vascular;  Laterality: Left;   REMOVAL OF A DIALYSIS CATHETER Right 02/02/2024   Procedure: DIALYSIS CATHETER EXCHANGE IN RIGHT INTERNAL JUGULAR;  Surgeon: Gretta Lonni PARAS, MD;  Location: [facility] OR;  Service: Vascular;  Laterality: Right;   UPPER GI ENDOSCOPY     VENOUS ANGIOPLASTY  05/25/2024   Procedure: VENOUS ANGIOPLASTY;  Surgeon: Magda Debby SAILOR, MD;  Location: [facility];  Service: Cardiovascular;;    reports that she has never smoked. She has never used smokeless tobacco. She reports that she does not drink alcohol and does not use drugs. family history includes Breast cancer in her maternal grandmother; Cancer in her mother; Dementia in her father; Depression in her brother; Diabetes in her father; Heart disease in her brother; Kidney disease in her mother; Liver cancer in her maternal grandfather; Lung cancer in her maternal uncle. Allergies  Allergen Reactions   Ace Inhibitors Other (See Comments) and Cough    Persistent dry cough   Amitriptyline Hcl Nausea And Vomiting and Other (See Comments)    GI Intolerance/Upset stomach   Aspirin  Other (See Comments)    GI Intolerance   Latex Itching   Prednisone  Nausea And  Vomiting      Outpatient Encounter Medications as of 09/06/2024  Medication Sig   Accu-Chek FastClix Lancets MISC Use to check blood sugar up to 3 times a day   ACCU-CHEK GUIDE test strip CHECK BLOOD SUGAR 3 TIMES A DAY   atorvastatin  (LIPITOR) 40 MG tablet Take 1 tablet (40 mg total) by mouth daily.   Blood Glucose Monitoring Suppl (ACCU-CHEK GUIDE) w/Device KIT 1 each by Does not apply route 3 (three) times daily. Check blood sugar 3 times a day   Blood Pressure  Monitoring (ADVANCED ONE STEP BP MONITOR) MISC 1 Units by Does not apply route daily.   Cholecalciferol (VITAMIN D3) 25 MCG (1000 UT) CAPS Take 1 capsule (1,000 Units total) by mouth daily.   cyclobenzaprine  (FLEXERIL ) 10 MG tablet Take 1 tablet (10 mg total) by mouth at bedtime.   dicyclomine  (BENTYL ) 20 MG tablet TAKE 1 TABLET(20 MG) BY MOUTH EVERY 6 HOURS AS NEEDED FOR SPASMS   diltiazem  (CARDIZEM  CD) 240 MG 24 hr capsule Take 1 capsule (240 mg total) by mouth daily.   Glucagon  (GVOKE HYPOPEN  2-PACK) 0.5 MG/0.1ML SOAJ Inject 0.5 mg into the skin as needed.   insulin  lispro (HUMALOG ) 100 UNIT/ML KwikPen For sugar 120 to 150 use one unit, for 151 to 200 use two units, for 201 to 250 use three units, for 251 to 300 use five units, for 301 to 350 use seven units, for 351 to 400 use 9 units, if sugar more than 400 call your primary care provider.   Insulin  Pen Needle (PEN NEEDLES) 31G X 5 MM MISC 1 each by Does not apply route 3 (three) times daily with meals. May substitute to any manufacturer covered by patient's insurance.   Insulin  Syringe-Needle U-100 31G X 15/64 0.5 ML MISC Use to inject insulin  one time a day   Insulin  Syringes, Disposable, U-100 1 ML MISC 2 Syringes by Does not apply route daily. Use to inject insulin  into the skin 1 time daily. diag code E11.9. Insulin  dependent   LANTUS  100 UNIT/ML injection ADMINISTER 22 UNITS UNDER THE SKIN DAILY   omeprazole  (PRILOSEC) 40 MG capsule TAKE 1 CAPSULE BY MOUTH EVERY DAY   oxyCODONE -acetaminophen  (PERCOCET/ROXICET) 5-325 MG tablet Take 1 tablet by mouth every 6 (six) hours as needed.   senna (SENOKOT) 8.6 MG tablet Take 1 tablet (8.6 mg total) by mouth as needed for constipation.   VELPHORO 500 MG chewable tablet Chew 500 mg by mouth 3 (three) times daily.   ezetimibe  (ZETIA ) 10 MG tablet Take 1 tablet (10 mg total) by mouth daily. (Patient not taking: Reported on 09/06/2024)   No facility-administered encounter medications on file as of  09/06/2024.    REVIEW OF SYSTEMS  : All other systems reviewed and negative except where noted in the History of Present Illness.   PHYSICAL EXAM: BP 110/80 (BP Location: Left Arm, Patient Position: Sitting, Cuff Size: Normal)   Pulse 66   Ht 4' 10 (1.473 m)   Wt 142 lb (64.4 kg)   LMP 02/15/2009   SpO2 100%   BMI 29.68 kg/m  General: Well developed AA female in no acute distress Head: Normocephalic and atraumatic Eyes:  Sclerae anicteric, conjunctiva pink. Ears: Normal auditory acuity Lungs: Clear throughout to auscultation; no W/R/R. Heart: Regular rate and rhythm; no M/R/G. Abdomen: Soft, non-distended.  BS present.  Non-tender. Rectal:  Will be done at the time of colonoscopy. Musculoskeletal: Symmetrical with no gross deformities  Skin: No lesions on visible extremities  Extremities: No edema  Neurological: Alert oriented x 4, grossly non-focal Psychological:  Alert and cooperative. Normal mood and affect  ASSESSMENT AND PLAN: *CRC screening: Last colonoscopy 2015 but was normal.  Needs colonoscopy part of her pretransplant evaluation.  Schedule with Dr. Nandigam at [facility] hospital due to her dialysis.  The risks, benefits, and alternatives to colonoscopy were discussed with the patient and she consent to proceed. *ESRD on dialysis MWF   CC:  [name], [place], *

## 2024-09-06 NOTE — Patient Instructions (Signed)
 You have been scheduled for a colonoscopy. Please follow written instructions given to you at your visit today.   If you use inhalers (even only as needed), please bring them with you on the day of your procedure.  DO NOT TAKE 7 DAYS PRIOR TO TEST- Trulicity (dulaglutide) Ozempic, Wegovy (semaglutide) Mounjaro (tirzepatide) Bydureon Bcise (exanatide extended release)  DO NOT TAKE 1 DAY PRIOR TO YOUR TEST Rybelsus (semaglutide) Adlyxin (lixisenatide) Victoza (liraglutide) Byetta (exanatide) ___________________________________________________________________________

## 2024-09-26 ENCOUNTER — Telehealth: Payer: Self-pay

## 2024-09-26 ENCOUNTER — Other Ambulatory Visit: Payer: Self-pay | Admitting: Emergency Medicine

## 2024-09-26 NOTE — Telephone Encounter (Signed)
 Copied from CRM #[id number]. Topic: General - Other >> Sep 26, 2024 11:45 AM Jasmin G wrote: Reason for CRM: Pt called regarding recent missed call from clinic unsure if it was for her or for her mom, MRN: [id number] I, could not find info to relay. Call pt back if needed at [phone].

## 2024-09-26 NOTE — Patient Instructions (Signed)
 Litzi Carver - I am sorry I was unable to reach you today for our scheduled appointment. I work with [name], Miguel Jose, MD and am calling to support your healthcare needs. Please contact me at [phone] at your earliest convenience. I look forward to speaking with you soon.   Thank you,   Heddy Shutter, RN, MSN, BSN, CCM [place]  [facility], Population Health Case Manager Phone: [phone]

## 2024-09-28 NOTE — Telephone Encounter (Signed)
 Spoke to patient and let her know I do not see anything recent regarding her or her mom. Patient verbalized understanding and was thankful for the call letting her know.

## 2024-10-03 ENCOUNTER — Ambulatory Visit: Admitting: Emergency Medicine

## 2024-10-10 ENCOUNTER — Ambulatory Visit: Admitting: Emergency Medicine

## 2024-10-12 ENCOUNTER — Telehealth: Payer: Self-pay

## 2024-10-19 ENCOUNTER — Other Ambulatory Visit: Payer: Self-pay

## 2024-10-19 ENCOUNTER — Other Ambulatory Visit: Payer: Self-pay | Admitting: Emergency Medicine

## 2024-10-19 DIAGNOSIS — R1030 Lower abdominal pain, unspecified: Secondary | ICD-10-CM

## 2024-10-19 NOTE — Patient Instructions (Signed)
 Visit Information  Thank you for taking time to visit with me today. Please don't hesitate to contact me if I can be of assistance to you before our next scheduled appointment.  Your next care management appointment is by telephone on 11/16/24 9:30 am  Please call the care guide team at [phone] if you need to cancel, schedule, or reschedule an appointment.   Please call the Suicide and Crisis Lifeline: 988 call the [place]  National Suicide Prevention Lifeline: 581-434-0080 or TTY: 603-115-1484 TTY 650-024-5658) to talk to a trained counselor if you are experiencing a Mental Health or Behavioral Health Crisis or need someone to talk to.  Heddy Shutter, RN, MSN, BSN, CCM [place]  [facility], Population Health Case Manager Phone: [phone]

## 2024-10-19 NOTE — Patient Outreach (Signed)
 Complex Care Management   Visit Note  10/19/2024  Name:  Nancy [name] MRN: [id number] DOB: 1956-09-21  Situation: Referral received for Complex Care Management related to ESRD I obtained verbal consent from Patient.  Visit completed with Patient  on the phone  Background:   Past Medical History:  Diagnosis Date   Carpal tunnel syndrome, bilateral    confirmed on nerve conduction studies   Chronic hepatitis C (HCC)    considered cured 06/2016 post retroviral therapy    Chronic kidney disease    dialysis M-W-F   Diabetes mellitus type 2, uncontrolled    Diabetic peripheral neuropathy (HCC)    GERD (gastroesophageal reflux disease)    History of endometriosis    History of Helicobacter infection 10/2006   Hyperlipidemia    Hypertension    Liver fibrosis 02/17/2016   F2/3    Sleep apnea 09/2018   Results showed very mild OSA - no CPAP    Assessment: Patient Reported Symptoms:  Cognitive Cognitive Status: No symptoms reported      Neurological Neurological Review of Symptoms: No symptoms reported    HEENT HEENT Symptoms Reported: No symptoms reported      Cardiovascular Cardiovascular Symptoms Reported: No symptoms reported    Respiratory Respiratory Symptoms Reported: No symptoms reported    Endocrine Endocrine Symptoms Reported: No symptoms reported Is patient diabetic?: Yes Is patient checking blood sugars at home?: Yes List most recent blood sugar readings, include date and time of day: BS 175 this morning AC. patient reports ate a piece of cake yesterday evening    Gastrointestinal Gastrointestinal Symptoms Reported: No symptoms reported      Genitourinary Genitourinary Symptoms Reported: No symptoms reported Additional Genitourinary Details: HD Genitourinary Management Strategies: Hemodialysis Hemodialysis Schedule: Monday, Wednesday and Friday Hemodialysis Last Treatment: 10/18/24  Integumentary Integumentary Symptoms Reported: No symptoms reported     Musculoskeletal Musculoskelatal Symptoms Reviewed: No symptoms reported        Psychosocial Psychosocial Symptoms Reported: No symptoms reported          There were no vitals filed for this visit.    Medications Reviewed Today     Reviewed by Xeng Kucher M, RN (Registered Nurse) on 10/19/24 at [phone]  Med List Status: <None>   Medication Order Taking? Sig Documenting Provider Last Dose Status Informant  Accu-Chek FastClix Lancets MISC [id number]  Use to check blood sugar up to 3 times a day Purcell Emil Schanz, MD  Active   ACCU-CHEK GUIDE test strip [id number]  CHECK BLOOD SUGAR 3 TIMES A DAY Purcell Emil Schanz, MD  Active Self  atorvastatin  (LIPITOR) 40 MG tablet [id number] Yes Take 1 tablet (40 mg total) by mouth daily. [name], Miguel Jose, MD  Active   Blood Glucose Monitoring Suppl (ACCU-CHEK GUIDE) w/Device KIT [id number]  1 each by Does not apply route 3 (three) times daily. Check blood sugar 3 times a day Arrien, Elidia Sieving, MD  Active Self  Blood Pressure Monitoring (ADVANCED ONE STEP BP MONITOR) MISC [id number]  1 Units by Does not apply route daily. Santo Stanly LABOR, MD  Active Self  Cholecalciferol (VITAMIN D3) 25 MCG (1000 UT) CAPS [id number] Yes Take 1 capsule (1,000 Units total) by mouth daily. [name], Miguel Jose, MD  Active   cyclobenzaprine  (FLEXERIL ) 10 MG tablet [id number] Yes Take 1 tablet (10 mg total) by mouth at bedtime. [name], Miguel Jose, MD  Active   dicyclomine  (BENTYL ) 20 MG tablet [id number] Yes TAKE 1 TABLET(20 MG) BY MOUTH EVERY 6 HOURS AS  NEEDED FOR SPASMS [name], Emil Schanz, MD  Active   diltiazem  (CARDIZEM  CD) 240 MG 24 hr capsule [id number] Yes Take 1 capsule (240 mg total) by mouth daily. [name], Miguel Jose, MD  Active   ezetimibe  (ZETIA ) 10 MG tablet [id number]  Take 1 tablet (10 mg total) by mouth daily.  Patient not taking: Reported on 10/19/2024   [name], Miguel Jose, MD  Active   Glucagon  (GVOKE HYPOPEN  2-PACK) 0.5  MG/0.1ML EMMANUEL [id number]  Inject 0.5 mg into the skin as needed. [name], Miguel Jose, MD  Active   insulin  lispro (HUMALOG ) 100 UNIT/ML KwikPen [id number] Yes For sugar 120 to 150 use one unit, for 151 to 200 use two units, for 201 to 250 use three units, for 251 to 300 use five units, for 301 to 350 use seven units, for 351 to 400 use 9 units, if sugar more than 400 call your primary care provider. Arrien, Elidia Sieving, MD  Active Self  Insulin  Pen Needle (PEN NEEDLES) 31G X 5 MM MISC [id number]  1 each by Does not apply route 3 (three) times daily with meals. May substitute to any manufacturer covered by patient's insurance. Arrien, Elidia Sieving, MD  Active Self  Insulin  Syringe-Needle U-100 31G X 15/64 0.5 ML MISC [id number]  Use to inject insulin  one time a day Arrien, Elidia Sieving, MD  Active Self  Insulin  Syringes, Disposable, U-100 1 ML MISC [id number]  2 Syringes by Does not apply route daily. Use to inject insulin  into the skin 1 time daily. diag code E11.9. Insulin  dependent Arrien, Elidia Sieving, MD  Active Self  LANTUS  100 UNIT/ML injection [id number] Yes ADMINISTER 22 UNITS UNDER THE SKIN DAILY Purcell Emil Schanz, MD  Active   metoprolol succinate (TOPROL-XL) 25 MG 24 hr tablet [id number] Yes TAKE 1 TABLET BY MOUTH EVERY DAY [name], Miguel Jose, MD  Active   omeprazole  (PRILOSEC) 40 MG capsule [id number] Yes TAKE 1 CAPSULE BY MOUTH EVERY DAY [name], Miguel Jose, MD  Active Self  oxyCODONE -acetaminophen  (PERCOCET/ROXICET) 5-325 MG tablet [id number] Yes Take 1 tablet by mouth every 6 (six) hours as needed. Gerome Herring M, PA-C  Active   senna (SENOKOT) 8.6 MG tablet [id number] Yes Take 1 tablet (8.6 mg total) by mouth as needed for constipation. Jinwala, Sagar H, MD  Active Self  VELPHORO 500 MG chewable tablet [id number] Yes Chew 500 mg by mouth 3 (three) times daily. [provider]  Active           Recommendation:   Continue Current Plan of Care  Follow Up Plan:    Telephone follow up appointment date/time:  11/16/24 9:30 am  Heddy Shutter, RN, MSN, BSN, CCM [place]  [facility], Population Health Case Manager Phone: [phone]

## 2024-10-31 ENCOUNTER — Ambulatory Visit: Admitting: Emergency Medicine

## 2024-11-07 ENCOUNTER — Ambulatory Visit: Admitting: Emergency Medicine

## 2024-11-13 ENCOUNTER — Ambulatory Visit: Admitting: Emergency Medicine

## 2024-11-14 ENCOUNTER — Encounter (HOSPITAL_COMMUNITY): Payer: Self-pay | Admitting: Gastroenterology

## 2024-11-14 ENCOUNTER — Other Ambulatory Visit: Payer: Self-pay | Admitting: Emergency Medicine

## 2024-11-14 ENCOUNTER — Ambulatory Visit: Admitting: Emergency Medicine

## 2024-11-14 DIAGNOSIS — Z1231 Encounter for screening mammogram for malignant neoplasm of breast: Secondary | ICD-10-CM

## 2024-11-14 NOTE — Progress Notes (Signed)
 Nancy [name]   PCP-[name] MD Cardiologist-Chandrasekhar MD Pulmonologist- n/a  EKG-06/21/23 Echo-04/19/23 Cath-n/a Stress-n/a ICD/PM-n/a GLP1-n/a  Blood Thinner-n/a  History: HTN,Hepatitis, DM, OSA, ESRD on MWF. Patient is being evaluated for kidney  transplant and needs colonoscopy for workup. She saw a cardiologist last year in July for high BP and palpitations. She hasn't see this year, but reports BP is better and hasn't had any cardiac issues/palpitations recently.  Anesthesia Review- Yes- okay to proceed

## 2024-11-15 ENCOUNTER — Telehealth: Payer: Self-pay

## 2024-11-15 NOTE — Telephone Encounter (Signed)
 Procedure:COLON Procedure date: 11/21/24 Procedure location: [facility] Arrival Time: 7:37 Spoke with the patient Y/N: Y Any prep concerns? N  Has the patient obtained the prep from the pharmacy ? Y Do you have a care partner and transportation: Y Any additional concerns? N

## 2024-11-16 ENCOUNTER — Telehealth: Payer: Self-pay

## 2024-11-16 NOTE — Patient Instructions (Signed)
 Nancy [name] - I am sorry I was unable to reach you today for our scheduled appointment. I work with [name], Miguel Jose, MD and am calling to support your healthcare needs. Please contact me at [phone] at your earliest convenience. I look forward to speaking with you soon.   Thank you,   Heddy Shutter, RN, MSN, BSN, CCM [place]  [facility], Population Health Case Manager Phone: [phone]

## 2024-11-20 ENCOUNTER — Encounter (HOSPITAL_COMMUNITY): Payer: Self-pay | Admitting: Gastroenterology

## 2024-11-20 NOTE — H&P (Signed)
  Gastroenterology History and Physical   Primary Care Physician:  Purcell Emil Schanz, MD   Reason for Procedure:   Colon cancer screening   Plan:    Colonoscopy with possible intervention     HPI: Nancy [name] is a 68 y.o. female here for colonoscopy for colon cancer screening. ESRD on HD,pre transplant work up.  The risks and benefits as well as alternatives of endoscopic procedure(s) have been discussed and reviewed. All questions answered. The patient agrees to proceed.    Past Medical History:  Diagnosis Date   Carpal tunnel syndrome, bilateral    confirmed on nerve conduction studies   Chronic hepatitis C (HCC)    considered cured 06/2016 post retroviral therapy    Chronic kidney disease    dialysis M-W-F   Diabetes mellitus type 2, uncontrolled    Diabetic peripheral neuropathy (HCC)    GERD (gastroesophageal reflux disease)    History of endometriosis    History of Helicobacter infection 10/2006   Hyperlipidemia    Hypertension    Liver fibrosis 02/17/2016   F2/3    Sleep apnea 09/2018   Results showed very mild OSA - no CPAP    Past Surgical History:  Procedure Laterality Date   A/V SHUNT INTERVENTION Left 05/25/2024   Procedure: A/V SHUNT INTERVENTION;  Surgeon: Magda Debby SAILOR, MD;  Location: [facility];  Service: Cardiovascular;  Laterality: Left;   ABDOMINAL HYSTERECTOMY     AV FISTULA PLACEMENT Left 10/22/2023   Procedure: LEFT ARM BRACHIOCEPHALIC ARTERIOVENOUS (AV) FISTULA CREATION;  Surgeon: Gretta Lonni PARAS, MD;  Location: [facility];  Service: Vascular;  Laterality: Left;   CARPAL TUNNEL RELEASE Bilateral    COLONOSCOPY     EYE SURGERY Bilateral    cataracts removed   FISTULA SUPERFICIALIZATION Left 01/10/2024   Procedure: LEFT ARM FISTULA SUPERFICIALIZATION;  Surgeon: Gretta Lonni PARAS, MD;  Location: [facility] OR;  Service: Vascular;  Laterality: Left;   LIGATION OF COMPETING BRANCHES OF ARTERIOVENOUS FISTULA Left 01/10/2024   Procedure:  REVISION OF ARTERIOVENOUS FISTULA WITH SIDE BRANCH LIGATION;  Surgeon: Gretta Lonni PARAS, MD;  Location: [facility] OR;  Service: Vascular;  Laterality: Left;   REMOVAL OF A DIALYSIS CATHETER Right 02/02/2024   Procedure: DIALYSIS CATHETER EXCHANGE IN RIGHT INTERNAL JUGULAR;  Surgeon: Gretta Lonni PARAS, MD;  Location: [facility];  Service: Vascular;  Laterality: Right;   UPPER GI ENDOSCOPY     VENOUS ANGIOPLASTY  05/25/2024   Procedure: VENOUS ANGIOPLASTY;  Surgeon: Magda Debby SAILOR, MD;  Location: [facility];  Service: Cardiovascular;;    Prior to Admission medications  Medication Sig Start Date End Date Taking? Authorizing Provider  Accu-Chek FastClix Lancets MISC Use to check blood sugar up to 3 times a day 03/14/24   Purcell Emil Schanz, MD  ACCU-CHEK GUIDE test strip CHECK BLOOD SUGAR 3 TIMES A DAY 03/30/23   Purcell Emil Schanz, MD  atorvastatin  (LIPITOR) 40 MG tablet Take 1 tablet (40 mg total) by mouth daily. 03/14/24   [name], Miguel Jose, MD  Blood Glucose Monitoring Suppl (ACCU-CHEK GUIDE) w/Device KIT 1 each by Does not apply route 3 (three) times daily. Check blood sugar 3 times a day 06/19/23   Arrien, Elidia Sieving, MD  Blood Pressure Monitoring (ADVANCED ONE STEP BP MONITOR) MISC 1 Units by Does not apply route daily. 07/26/23   Santo Stanly LABOR, MD  Cholecalciferol (VITAMIN D3) 25 MCG (1000 UT) CAPS Take 1 capsule (1,000 Units total) by mouth daily. 03/14/24   Purcell Emil  Jose, MD  cyclobenzaprine  (FLEXERIL ) 10 MG tablet Take 1 tablet (10 mg total) by mouth at bedtime. 06/27/24   Purcell Emil Schanz, MD  dicyclomine  (BENTYL ) 20 MG tablet TAKE 1 TABLET(20 MG) BY MOUTH EVERY 6 HOURS AS NEEDED FOR SPASMS 10/19/24   Purcell Emil Schanz, MD  diltiazem  (CARDIZEM  CD) 240 MG 24 hr capsule Take 1 capsule (240 mg total) by mouth daily. 03/14/24   Purcell Emil Schanz, MD  ezetimibe  (ZETIA ) 10 MG tablet Take 1 tablet (10 mg total) by mouth daily. Patient not taking: Reported on  10/19/2024 03/14/24   [name], Miguel Jose, MD  Glucagon  (GVOKE HYPOPEN  2-PACK) 0.5 MG/0.1ML SOAJ Inject 0.5 mg into the skin as needed. 03/14/24   [name], Miguel Jose, MD  insulin  lispro (HUMALOG ) 100 UNIT/ML KwikPen For sugar 120 to 150 use one unit, for 151 to 200 use two units, for 201 to 250 use three units, for 251 to 300 use five units, for 301 to 350 use seven units, for 351 to 400 use 9 units, if sugar more than 400 call your primary care provider. 06/19/23   Arrien, Mauricio Daniel, MD  Insulin  Pen Needle (PEN NEEDLES) 31G X 5 MM MISC 1 each by Does not apply route 3 (three) times daily with meals. May substitute to any manufacturer covered by patient's insurance. 06/19/23   Arrien, Elidia Sieving, MD  Insulin  Syringe-Needle U-100 31G X 15/64 0.5 ML MISC Use to inject insulin  one time a day 06/19/23   Arrien, Elidia Sieving, MD  Insulin  Syringes, Disposable, U-100 1 ML MISC 2 Syringes by Does not apply route daily. Use to inject insulin  into the skin 1 time daily. diag code E11.9. Insulin  dependent 06/19/23   Arrien, Elidia Sieving, MD  LANTUS  100 UNIT/ML injection ADMINISTER 22 UNITS UNDER THE SKIN DAILY 07/15/24   Purcell Emil Schanz, MD  metoprolol succinate (TOPROL-XL) 25 MG 24 hr tablet TAKE 1 TABLET BY MOUTH EVERY DAY 09/26/24   Purcell Emil Schanz, MD  omeprazole  (PRILOSEC) 40 MG capsule TAKE 1 CAPSULE BY MOUTH EVERY DAY 03/30/23   Purcell Emil Schanz, MD  oxyCODONE -acetaminophen  (PERCOCET/ROXICET) 5-325 MG tablet Take 1 tablet by mouth every 6 (six) hours as needed. 02/02/24   Gerome Maurilio HERO, PA-C  senna (SENOKOT) 8.6 MG tablet Take 1 tablet (8.6 mg total) by mouth as needed for constipation. 07/08/20   Jinwala, Sagar H, MD  VELPHORO 500 MG chewable tablet Chew 500 mg by mouth 3 (three) times daily.    [provider]    No current facility-administered medications for this encounter.   Current Outpatient Medications  Medication Sig Dispense Refill   Accu-Chek FastClix  Lancets MISC Use to check blood sugar up to 3 times a day 306 each 3   ACCU-CHEK GUIDE test strip CHECK BLOOD SUGAR 3 TIMES A DAY 300 strip 3   atorvastatin  (LIPITOR) 40 MG tablet Take 1 tablet (40 mg total) by mouth daily. 90 tablet 3   Blood Glucose Monitoring Suppl (ACCU-CHEK GUIDE) w/Device KIT 1 each by Does not apply route 3 (three) times daily. Check blood sugar 3 times a day 1 kit 0   Blood Pressure Monitoring (ADVANCED ONE STEP BP MONITOR) MISC 1 Units by Does not apply route daily. 1 each 0   Cholecalciferol (VITAMIN D3) 25 MCG (1000 UT) CAPS Take 1 capsule (1,000 Units total) by mouth daily. 60 capsule 3   cyclobenzaprine  (FLEXERIL ) 10 MG tablet Take 1 tablet (10 mg total) by mouth at bedtime. 30 tablet  3   dicyclomine  (BENTYL ) 20 MG tablet TAKE 1 TABLET(20 MG) BY MOUTH EVERY 6 HOURS AS NEEDED FOR SPASMS 30 tablet 1   diltiazem  (CARDIZEM  CD) 240 MG 24 hr capsule Take 1 capsule (240 mg total) by mouth daily. 90 capsule 3   ezetimibe  (ZETIA ) 10 MG tablet Take 1 tablet (10 mg total) by mouth daily. (Patient not taking: Reported on 10/19/2024) 90 tablet 3   Glucagon  (GVOKE HYPOPEN  2-PACK) 0.5 MG/0.1ML SOAJ Inject 0.5 mg into the skin as needed. 0.2 mL 3   insulin  lispro (HUMALOG ) 100 UNIT/ML KwikPen For sugar 120 to 150 use one unit, for 151 to 200 use two units, for 201 to 250 use three units, for 251 to 300 use five units, for 301 to 350 use seven units, for 351 to 400 use 9 units, if sugar more than 400 call your primary care provider. 15 mL 0   Insulin  Pen Needle (PEN NEEDLES) 31G X 5 MM MISC 1 each by Does not apply route 3 (three) times daily with meals. May substitute to any manufacturer covered by patient's insurance. 100 each 0   Insulin  Syringe-Needle U-100 31G X 15/64 0.5 ML MISC Use to inject insulin  one time a day 100 each 0   Insulin  Syringes, Disposable, U-100 1 ML MISC 2 Syringes by Does not apply route daily. Use to inject insulin  into the skin 1 time daily. diag code E11.9.  Insulin  dependent 100 each 0   LANTUS  100 UNIT/ML injection ADMINISTER 22 UNITS UNDER THE SKIN DAILY 10 mL 3   metoprolol succinate (TOPROL-XL) 25 MG 24 hr tablet TAKE 1 TABLET BY MOUTH EVERY DAY 90 tablet 3   omeprazole  (PRILOSEC) 40 MG capsule TAKE 1 CAPSULE BY MOUTH EVERY DAY 90 capsule 3   oxyCODONE -acetaminophen  (PERCOCET/ROXICET) 5-325 MG tablet Take 1 tablet by mouth every 6 (six) hours as needed. 12 tablet 0   senna (SENOKOT) 8.6 MG tablet Take 1 tablet (8.6 mg total) by mouth as needed for constipation. 60 tablet 3   VELPHORO 500 MG chewable tablet Chew 500 mg by mouth 3 (three) times daily.      Allergies as of 09/06/2024 - Review Complete 09/06/2024  Allergen Reaction Noted   Ace inhibitors Other (See Comments) and Cough 05/11/2014   Amitriptyline hcl Nausea And Vomiting and Other (See Comments) 09/20/2013   Aspirin  Other (See Comments) 02/24/2018   Latex Itching 11/08/2017   Prednisone  Nausea And Vomiting 08/15/2015    Family History  Problem Relation Age of Onset   Cancer Mother        lung   Kidney disease Mother    Lung cancer Maternal Uncle    Breast cancer Maternal Grandmother    Liver cancer Maternal Grandfather    Heart disease Brother    Depression Brother    Diabetes Father    Dementia Father    Colon cancer Neg Hx     Social History   Socioeconomic History   Marital status: Single    Spouse name: Not on file   Number of children: 0   Years of education: 12   Highest education level: Not on file  Occupational History   Occupation: Scientist, Research (medical): PERSONAL CARE   Occupation: Disabled  Tobacco Use   Smoking status: Never   Smokeless tobacco: Never  Vaping Use   Vaping status: Never Used  Substance and Sexual Activity   Alcohol use: No    Alcohol/week: 0.0 standard drinks of alcohol  Drug use: No   Sexual activity: Yes    Birth control/protection: Surgical    Comment: Hysterectomy  Other Topics Concern   Not on file  Social History  Narrative   Current Social History 08/28/2019        Patient lives alone in a 7th floor apartment with elevator. There are not steps up to the entrance the patient uses.       Patient's method of transportation is via family member (mom or friend).      The highest level of education was high school diploma.      The patient currently disabled.      Identified important Relationships are My mother - Patient is caregiver for Mother      Pets : None       Interests / Fun: TV, movies       Receiving HD on Monday, Wednesday, Friday      Current Stressors: People, my health       Religious / Personal Beliefs: Baptist       L. Ducatte, RN, BSN       Social Drivers of Health   Tobacco Use: Low Risk (11/20/2024)   Patient History    Smoking Tobacco Use: Never    Smokeless Tobacco Use: Never    Passive Exposure: Not on file  Financial Resource Strain: Low Risk (04/24/2022)   Overall Financial Resource Strain (CARDIA)    Difficulty of Paying Living Expenses: Not hard at all  Food Insecurity: No Food Insecurity (08/17/2024)   Epic    Worried About Programme Researcher, Broadcasting/film/video in the Last Year: Never true    Ran Out of Food in the Last Year: Never true  Transportation Needs: No Transportation Needs (08/17/2024)   Epic    Lack of Transportation (Medical): No    Lack of Transportation (Non-Medical): No  Physical Activity: Inactive (03/23/2024)   Exercise Vital Sign    Days of Exercise per Week: 0 days    Minutes of Exercise per Session: 0 min  Stress: Stress Concern Present (03/23/2024)   [employer] of Occupational Health - Occupational Stress Questionnaire    Feeling of Stress : To some extent  Social Connections: Moderately Isolated (03/23/2024)   Social Connection and Isolation Panel    Frequency of Communication with Friends and Family: More than three times a week    Frequency of Social Gatherings with Friends and Family: Never    Attends Religious Services: Never     Database Administrator or Organizations: No    Attends Banker Meetings: Never    Marital Status: Living with partner  Intimate Partner Violence: Not At Risk (08/17/2024)   Epic    Fear of Current or Ex-Partner: No    Emotionally Abused: No    Physically Abused: No    Sexually Abused: No  Depression (PHQ2-9): Medium Risk (08/17/2024)   Depression (PHQ2-9)    PHQ-2 Score: 8  Alcohol Screen: Low Risk (03/23/2024)   Alcohol Screen    Last Alcohol Screening Score (AUDIT): 0  Housing: Low Risk (08/17/2024)   Epic    Unable to Pay for Housing in the Last Year: No    Number of Times Moved in the Last Year: 0    Homeless in the Last Year: No  Utilities: Not At Risk (08/17/2024)   Epic    Threatened with loss of utilities: No  Health Literacy: Not on file    Review of Systems:  All other  review of systems negative except as mentioned in the HPI.  Physical Exam: Vital signs in last 24 hours: Temp:  [98.5 F (36.9 C)] 98.5 F (36.9 C) (12/16 0817) Pulse Rate:  [72] 72 (12/16 0817) Resp:  [18] 18 (12/16 0817) BP: (202)/(92) 202/92 (12/16 0817) SpO2:  [100 %] 100 % (12/16 0817) Weight:  [64 kg] 64 kg (12/16 0817)   General:   Alert, NAD Lungs:  Clear .   Heart:  Regular rate and rhythm Abdomen:  Soft, nontender and nondistended. Neuro/Psych:  Alert and cooperative. Normal mood and affect. A and O x 3   K. Veena Mry Lamia , MD [phone]

## 2024-11-21 ENCOUNTER — Encounter (HOSPITAL_COMMUNITY): Payer: Self-pay | Admitting: Gastroenterology

## 2024-11-21 ENCOUNTER — Ambulatory Visit (HOSPITAL_COMMUNITY)
Admission: RE | Admit: 2024-11-21 | Discharge: 2024-11-21 | Disposition: A | Attending: Gastroenterology | Admitting: Gastroenterology

## 2024-11-21 ENCOUNTER — Ambulatory Visit (HOSPITAL_COMMUNITY): Admitting: Anesthesiology

## 2024-11-21 ENCOUNTER — Encounter (HOSPITAL_COMMUNITY): Admission: RE | Disposition: A | Payer: Self-pay | Source: Home / Self Care | Attending: Gastroenterology

## 2024-11-21 ENCOUNTER — Other Ambulatory Visit: Payer: Self-pay

## 2024-11-21 DIAGNOSIS — Z992 Dependence on renal dialysis: Secondary | ICD-10-CM | POA: Diagnosis not present

## 2024-11-21 DIAGNOSIS — D122 Benign neoplasm of ascending colon: Secondary | ICD-10-CM | POA: Diagnosis not present

## 2024-11-21 DIAGNOSIS — I120 Hypertensive chronic kidney disease with stage 5 chronic kidney disease or end stage renal disease: Secondary | ICD-10-CM | POA: Diagnosis not present

## 2024-11-21 DIAGNOSIS — K644 Residual hemorrhoidal skin tags: Secondary | ICD-10-CM | POA: Diagnosis not present

## 2024-11-21 DIAGNOSIS — E1142 Type 2 diabetes mellitus with diabetic polyneuropathy: Secondary | ICD-10-CM | POA: Diagnosis not present

## 2024-11-21 DIAGNOSIS — Z8619 Personal history of other infectious and parasitic diseases: Secondary | ICD-10-CM | POA: Diagnosis not present

## 2024-11-21 DIAGNOSIS — N186 End stage renal disease: Secondary | ICD-10-CM | POA: Insufficient documentation

## 2024-11-21 DIAGNOSIS — Z794 Long term (current) use of insulin: Secondary | ICD-10-CM | POA: Insufficient documentation

## 2024-11-21 DIAGNOSIS — I132 Hypertensive heart and chronic kidney disease with heart failure and with stage 5 chronic kidney disease, or end stage renal disease: Secondary | ICD-10-CM | POA: Diagnosis not present

## 2024-11-21 DIAGNOSIS — K219 Gastro-esophageal reflux disease without esophagitis: Secondary | ICD-10-CM | POA: Diagnosis not present

## 2024-11-21 DIAGNOSIS — Z1211 Encounter for screening for malignant neoplasm of colon: Secondary | ICD-10-CM | POA: Diagnosis present

## 2024-11-21 DIAGNOSIS — D125 Benign neoplasm of sigmoid colon: Secondary | ICD-10-CM | POA: Insufficient documentation

## 2024-11-21 DIAGNOSIS — E1122 Type 2 diabetes mellitus with diabetic chronic kidney disease: Secondary | ICD-10-CM | POA: Insufficient documentation

## 2024-11-21 DIAGNOSIS — I509 Heart failure, unspecified: Secondary | ICD-10-CM | POA: Diagnosis not present

## 2024-11-21 DIAGNOSIS — K635 Polyp of colon: Secondary | ICD-10-CM

## 2024-11-21 DIAGNOSIS — D123 Benign neoplasm of transverse colon: Secondary | ICD-10-CM | POA: Insufficient documentation

## 2024-11-21 DIAGNOSIS — G4733 Obstructive sleep apnea (adult) (pediatric): Secondary | ICD-10-CM | POA: Diagnosis not present

## 2024-11-21 DIAGNOSIS — K648 Other hemorrhoids: Secondary | ICD-10-CM | POA: Insufficient documentation

## 2024-11-21 DIAGNOSIS — D120 Benign neoplasm of cecum: Secondary | ICD-10-CM | POA: Diagnosis not present

## 2024-11-21 DIAGNOSIS — I5032 Chronic diastolic (congestive) heart failure: Secondary | ICD-10-CM | POA: Diagnosis not present

## 2024-11-21 HISTORY — PX: POLYPECTOMY: SHX149

## 2024-11-21 HISTORY — PX: COLONOSCOPY: SHX5424

## 2024-11-21 LAB — POCT I-STAT, CHEM 8
BUN: 30 mg/dL — ABNORMAL HIGH (ref 8–23)
Calcium, Ion: 0.99 mmol/L — ABNORMAL LOW (ref 1.15–1.40)
Chloride: 107 mmol/L (ref 98–111)
Creatinine, Ser: 7 mg/dL — ABNORMAL HIGH (ref 0.44–1.00)
Glucose, Bld: 212 mg/dL — ABNORMAL HIGH (ref 70–99)
HCT: 33 % — ABNORMAL LOW (ref 36.0–46.0)
Hemoglobin: 11.2 g/dL — ABNORMAL LOW (ref 12.0–15.0)
Potassium: 4.8 mmol/L (ref 3.5–5.1)
Sodium: 142 mmol/L (ref 135–145)
TCO2: 25 mmol/L (ref 22–32)

## 2024-11-21 SURGERY — COLONOSCOPY
Anesthesia: Monitor Anesthesia Care

## 2024-11-21 MED ORDER — LABETALOL HCL 5 MG/ML IV SOLN
INTRAVENOUS | Status: AC
  Filled 2024-11-21: qty 4

## 2024-11-21 MED ORDER — PROPOFOL 10 MG/ML IV BOLUS
INTRAVENOUS | Status: DC | PRN
  Administered 2024-11-21 (×2): 20 mg via INTRAVENOUS
  Administered 2024-11-21: 09:00:00 40 mg via INTRAVENOUS

## 2024-11-21 MED ORDER — SODIUM CHLORIDE 0.9 % IV SOLN: INTRAVENOUS | Status: DC | PRN

## 2024-11-21 MED ORDER — LABETALOL HCL 5 MG/ML IV SOLN
10.0000 mg | Freq: Once | INTRAVENOUS | Status: AC
  Administered 2024-11-21: 09:00:00 10 mg via INTRAVENOUS

## 2024-11-21 MED ORDER — PROPOFOL 500 MG/50ML IV EMUL
INTRAVENOUS | Status: DC | PRN
  Administered 2024-11-21: 09:00:00 125 ug/kg/min via INTRAVENOUS

## 2024-11-21 NOTE — Anesthesia Preprocedure Evaluation (Signed)
 Anesthesia Evaluation  Patient identified by MRN, date of birth, ID band Patient awake    Reviewed: Allergy & Precautions, H&P , NPO status , Patient's Chart, lab work & pertinent test results  Airway Mallampati: II   Neck ROM: full    Dental   Pulmonary sleep apnea    breath sounds clear to auscultation       Cardiovascular hypertension, +CHF   Rhythm:regular Rate:Normal     Neuro/Psych  Neuromuscular disease    GI/Hepatic ,GERD  ,,(+) Hepatitis -, C  Endo/Other  diabetes, Type 2    Renal/GU ESRFRenal disease     Musculoskeletal   Abdominal   Peds  Hematology   Anesthesia Other Findings   Reproductive/Obstetrics                              Anesthesia Physical Anesthesia Plan  ASA: 3  Anesthesia Plan: MAC   Post-op Pain Management:    Induction: Intravenous  PONV Risk Score and Plan: 2 and Propofol  infusion and Treatment may vary due to age or medical condition  Airway Management Planned: Simple Face Mask  Additional Equipment:   Intra-op Plan:   Post-operative Plan:   Informed Consent: I have reviewed the patients History and Physical, chart, labs and discussed the procedure including the risks, benefits and alternatives for the proposed anesthesia with the patient or authorized representative who has indicated his/her understanding and acceptance.     Dental advisory given  Plan Discussed with: CRNA, Anesthesiologist and Surgeon  Anesthesia Plan Comments:         Anesthesia Quick Evaluation

## 2024-11-21 NOTE — Anesthesia Postprocedure Evaluation (Signed)
 Anesthesia Post Note  Patient: Nancy [name]  Procedure(s) Performed: COLONOSCOPY POLYPECTOMY, INTESTINE     Patient location during evaluation: Endoscopy Anesthesia Type: MAC Level of consciousness: awake and alert Pain management: pain level controlled Vital Signs Assessment: post-procedure vital signs reviewed and stable Respiratory status: spontaneous breathing, nonlabored ventilation, respiratory function stable and patient connected to nasal cannula oxygen Cardiovascular status: stable and blood pressure returned to baseline Postop Assessment: no apparent nausea or vomiting Anesthetic complications: no   No notable events documented.  Last Vitals:  Vitals:   11/21/24 0953 11/21/24 0956  BP: (!) 227/91 (!) 208/94  Pulse:    Resp: 18   Temp:    SpO2:      Last Pain:  Vitals:   11/21/24 0953  TempSrc:   PainSc: 0-No pain                 Curran Lenderman S

## 2024-11-21 NOTE — Discharge Instructions (Signed)

## 2024-11-21 NOTE — Transfer of Care (Signed)
 Immediate Anesthesia Transfer of Care Note  Patient: Nancy [name]  Procedure(s) Performed: COLONOSCOPY POLYPECTOMY, INTESTINE  Patient Location: Endoscopy Unit  Anesthesia Type:MAC  Level of Consciousness: drowsy and patient cooperative  Airway & Oxygen Therapy: Patient Spontanous Breathing  Post-op Assessment: Report given to RN and Post -op Vital signs reviewed and stable  Post vital signs: Reviewed and stable  Last Vitals:  Vitals Value Taken Time  BP 175/73 11/21/24 09:38  Temp    Pulse 72 11/21/24 09:39  Resp 24 11/21/24 09:39  SpO2 100 % 11/21/24 09:39  Vitals shown include unfiled device data.  Last Pain:  Vitals:   11/21/24 0902  TempSrc:   PainSc: 0-No pain         Complications: No notable events documented.

## 2024-11-21 NOTE — Op Note (Signed)
 [facility] Patient Name: Nancy [name] Procedure Date: 11/21/2024 MRN: [id number] Attending MD: Gustav ALONSO Mcgee , MD, [id number] Date of Birth: [birth year] CSN: [id number] Age: 68 Admit Type: Ambulatory Procedure:                Colonoscopy Indications:              Screening for colorectal malignant neoplasm Providers:                Gustav ALONSO Mcgee, MD, Ozell Pouch, [place], Technician Referring MD:              Medicines:                Monitored Anesthesia Care Complications:            No immediate complications. Estimated Blood Loss:     Estimated blood loss was minimal. Procedure:                Pre-Anesthesia Assessment:                           - Prior to the procedure, a History and Physical                            was performed, and patient medications and                            allergies were reviewed. The patient's tolerance of                            previous anesthesia was also reviewed. The risks                            and benefits of the procedure and the sedation                            options and risks were discussed with the patient.                            All questions were answered, and informed consent                            was obtained. Prior Anticoagulants: The patient has                            taken no anticoagulant or antiplatelet agents. ASA                            Grade Assessment: IV - A patient with severe                            systemic disease that is a constant threat to life.  After reviewing the risks and benefits, the patient                            was deemed in satisfactory condition to undergo the                            procedure.                           After obtaining informed consent, the colonoscope                            was passed under direct vision. Throughout the                             procedure, the patient's blood pressure, pulse, and                            oxygen saturations were monitored continuously. The                            [id number] ([id number]) olympus colonscope was                            introduced through the anus and advanced to the the                            cecum, identified by appendiceal orifice and                            ileocecal valve. The colonoscopy was performed                            without difficulty. The patient tolerated the                            procedure well. The quality of the bowel                            preparation was good. The ileocecal valve,                            appendiceal orifice, and rectum were photographed. Scope In: 9:15:09 AM Scope Out: 9:30:35 AM Scope Withdrawal Time: 0 hours 11 minutes 52 seconds  Total Procedure Duration: 0 hours 15 minutes 26 seconds  Findings:      The perianal and digital rectal examinations were normal.      Six sessile polyps were found in the sigmoid colon x 1, transverse colon       x 1, ascending colon x 3 and cecum x 1. The polyps were 4 to 15 mm in       size. These polyps were removed with a cold snare. Resection and       retrieval were complete.      Non-bleeding external and internal hemorrhoids were found during  retroflexion. The hemorrhoids were medium-sized. Impression:               - Six 4 to 15 mm polyps in the sigmoid colon, in                            the transverse colon, in the ascending colon and in                            the cecum, removed with a cold snare. Resected and                            retrieved.                           - Non-bleeding external and internal hemorrhoids. Moderate Sedation:      N/A Recommendation:           - Resume previous diet.                           - Continue present medications.                           - Await pathology results.                           - Repeat colonoscopy in 3 years  for surveillance                            based on pathology results. Procedure Code(s):        --- Professional ---                           [phone], Colonoscopy, flexible; with removal of                            tumor(s), polyp(s), or other lesion(s) by snare                            technique Diagnosis Code(s):        --- Professional ---                           Z12.11, Encounter for screening for malignant                            neoplasm of colon                           D12.5, Benign neoplasm of sigmoid colon                           D12.3, Benign neoplasm of transverse colon (hepatic                            flexure or splenic flexure)  D12.2, Benign neoplasm of ascending colon                           D12.0, Benign neoplasm of cecum                           K64.8, Other hemorrhoids CPT copyright 2022 American Medical Association. All rights reserved. The codes documented in this report are preliminary and upon coder review may  be revised to meet current compliance requirements. Cherish Runde V. Mantaj Chamberlin, MD 11/21/2024 9:43:22 AM This report has been signed electronically. Number of Addenda: 0

## 2024-11-21 NOTE — OR Nursing (Signed)
 Patient went to RR after getting dressed, passed small amount of liquid bright red blood, flushed before RN could see it. Said felt a little dizzy moving back to wheelchair. Rolled her back into Endo area and Dr Shila spoke with her, relayed that since multiple polyps removed may pass some blood today but should not be a lot, nor clots, but if anything significant go to the ED. BP same as before, feeling better before discharge. Spoke with patient's mom about it when I rolled her out to the car.

## 2024-11-22 LAB — SURGICAL PATHOLOGY

## 2024-11-23 ENCOUNTER — Telehealth: Payer: Self-pay

## 2024-11-23 ENCOUNTER — Ambulatory Visit: Admitting: Emergency Medicine

## 2024-11-23 ENCOUNTER — Encounter: Payer: Self-pay | Admitting: Emergency Medicine

## 2024-11-23 VITALS — BP 138/80 | HR 41 | Temp 98.5°F | Ht <= 58 in | Wt 140.0 lb

## 2024-11-23 DIAGNOSIS — I152 Hypertension secondary to endocrine disorders: Secondary | ICD-10-CM | POA: Diagnosis not present

## 2024-11-23 DIAGNOSIS — E785 Hyperlipidemia, unspecified: Secondary | ICD-10-CM | POA: Diagnosis not present

## 2024-11-23 DIAGNOSIS — Z794 Long term (current) use of insulin: Secondary | ICD-10-CM

## 2024-11-23 DIAGNOSIS — N185 Chronic kidney disease, stage 5: Secondary | ICD-10-CM

## 2024-11-23 DIAGNOSIS — I1311 Hypertensive heart and chronic kidney disease without heart failure, with stage 5 chronic kidney disease, or end stage renal disease: Secondary | ICD-10-CM

## 2024-11-23 DIAGNOSIS — E1159 Type 2 diabetes mellitus with other circulatory complications: Secondary | ICD-10-CM

## 2024-11-23 DIAGNOSIS — E1169 Type 2 diabetes mellitus with other specified complication: Secondary | ICD-10-CM

## 2024-11-23 DIAGNOSIS — K219 Gastro-esophageal reflux disease without esophagitis: Secondary | ICD-10-CM

## 2024-11-23 DIAGNOSIS — D122 Benign neoplasm of ascending colon: Secondary | ICD-10-CM | POA: Diagnosis not present

## 2024-11-23 LAB — POCT GLYCOSYLATED HEMOGLOBIN (HGB A1C): HbA1c POC (<> result, manual entry): 8.1 % (ref 4.0–5.6)

## 2024-11-23 MED ORDER — OMEPRAZOLE 40 MG PO CPDR: 40.0000 mg | DELAYED_RELEASE_CAPSULE | Freq: Every day | ORAL | 1 refills | Status: AC

## 2024-11-23 MED ORDER — INSULIN GLARGINE 100 UNIT/ML ~~LOC~~ SOLN: 25.0000 [IU] | Freq: Every day | SUBCUTANEOUS | 3 refills | Status: AC

## 2024-11-23 NOTE — Assessment & Plan Note (Signed)
Chronic stable conditions Continue atorvastatin 40 mg and Zetia 10 mg daily

## 2024-11-23 NOTE — Telephone Encounter (Signed)
 She has taken this before without any problems.  Thanks.

## 2024-11-23 NOTE — Telephone Encounter (Signed)
 Please advise

## 2024-11-23 NOTE — Assessment & Plan Note (Signed)
 Stable blood pressure with normal readings at home Continue diltiazem 360 mg daily Tolerating dialysis well. Feels much improved. Clinically normovolemic. No concerns today.

## 2024-11-23 NOTE — Telephone Encounter (Signed)
 noted

## 2024-11-23 NOTE — Patient Instructions (Signed)
 Take Lantus  insulin  25 units daily Continue checking glucose in the morning and in the afternoon Contact the office if numbers persistently abnormal  Health Maintenance After Age 67 After age 58, you are at a higher risk for certain long-term diseases and infections as well as injuries from falls. Falls are a major cause of broken bones and head injuries in people who are older than age 6. Getting regular preventive care can help to keep you healthy and well. Preventive care includes getting regular testing and making lifestyle changes as recommended by your health care provider. Talk with your health care provider about: Which screenings and tests you should have. A screening is a test that checks for a disease when you have no symptoms. A diet and exercise plan that is right for you. What should I know about screenings and tests to prevent falls? Screening and testing are the best ways to find a health problem early. Early diagnosis and treatment give you the best chance of managing medical conditions that are common after age 51. Certain conditions and lifestyle choices may make you more likely to have a fall. Your health care provider may recommend: Regular vision checks. Poor vision and conditions such as cataracts can make you more likely to have a fall. If you wear glasses, make sure to get your prescription updated if your vision changes. Medicine review. Work with your health care provider to regularly review all of the medicines you are taking, including over-the-counter medicines. Ask your health care provider about any side effects that may make you more likely to have a fall. Tell your health care provider if any medicines that you take make you feel dizzy or sleepy. Strength and balance checks. Your health care provider may recommend certain tests to check your strength and balance while standing, walking, or changing positions. Foot health exam. Foot pain and numbness, as well as not  wearing proper footwear, can make you more likely to have a fall. Screenings, including: Osteoporosis screening. Osteoporosis is a condition that causes the bones to get weaker and break more easily. Blood pressure screening. Blood pressure changes and medicines to control blood pressure can make you feel dizzy. Depression screening. You may be more likely to have a fall if you have a fear of falling, feel depressed, or feel unable to do activities that you used to do. Alcohol use screening. Using too much alcohol can affect your balance and may make you more likely to have a fall. Follow these instructions at home: Lifestyle Do not drink alcohol if: Your health care provider tells you not to drink. If you drink alcohol: Limit how much you have to: 0-1 drink a day for women. 0-2 drinks a day for men. Know how much alcohol is in your drink. In the [place] one drink equals one 12 oz bottle of beer (355 mL), one 5 oz glass of wine (148 mL), or one 1 oz glass of hard liquor (44 mL). Do not use any products that contain nicotine or tobacco. These products include cigarettes, chewing tobacco, and vaping devices, such as e-cigarettes. If you need help quitting, ask your health care provider. Activity  Follow a regular exercise program to stay fit. This will help you maintain your balance. Ask your health care provider what types of exercise are appropriate for you. If you need a cane or walker, use it as recommended by your health care provider. Wear supportive shoes that have nonskid soles. Safety  Remove any tripping hazards,  such as rugs, cords, and clutter. Install safety equipment such as grab bars in bathrooms and safety rails on stairs. Keep rooms and walkways well-lit. General instructions Talk with your health care provider about your risks for falling. Tell your health care provider if: You fall. Be sure to tell your health care provider about all falls, even ones that seem minor. You  feel dizzy, tiredness (fatigue), or off-balance. Take over-the-counter and prescription medicines only as told by your health care provider. These include supplements. Eat a healthy diet and maintain a healthy weight. A healthy diet includes low-fat dairy products, low-fat (lean) meats, and fiber from whole grains, beans, and lots of fruits and vegetables. Stay current with your vaccines. Schedule regular health, dental, and eye exams. Summary Having a healthy lifestyle and getting preventive care can help to protect your health and wellness after age 28. Screening and testing are the best way to find a health problem early and help you avoid having a fall. Early diagnosis and treatment give you the best chance for managing medical conditions that are more common for people who are older than age 55. Falls are a major cause of broken bones and head injuries in people who are older than age 6. Take precautions to prevent a fall at home. Work with your health care provider to learn what changes you can make to improve your health and wellness and to prevent falls. This information is not intended to replace advice given to you by your health care provider. Make sure you discuss any questions you have with your health care provider. Document Revised: 04/14/2021 Document Reviewed: 04/14/2021 [employer] Patient Education  2024 [employer].

## 2024-11-23 NOTE — Progress Notes (Signed)
 Nancy [name] 68 y.o.   Chief Complaint  Patient presents with   Follow-up    6 months     HISTORY OF PRESENT ILLNESS: This is a 68 y.o. female here for 44-month follow-up of chronic medical conditions Overall doing well. Has no complaints or medical concerns today. Lab Results  Component Value Date   HGBA1C 8.9 (A) 08/16/2023     HPI   Prior to Admission medications  Medication Sig Start Date End Date Taking? Authorizing Provider  Accu-Chek FastClix Lancets MISC Use to check blood sugar up to 3 times a day 03/14/24   Purcell Emil Schanz, MD  ACCU-CHEK GUIDE test strip CHECK BLOOD SUGAR 3 TIMES A DAY 03/30/23   Purcell Emil Schanz, MD  atorvastatin  (LIPITOR) 40 MG tablet Take 1 tablet (40 mg total) by mouth daily. 03/14/24   Nelida Mandarino Jose, MD  Blood Glucose Monitoring Suppl (ACCU-CHEK GUIDE) w/Device KIT 1 each by Does not apply route 3 (three) times daily. Check blood sugar 3 times a day 06/19/23   Arrien, Elidia Sieving, MD  Blood Pressure Monitoring (ADVANCED ONE STEP BP MONITOR) MISC 1 Units by Does not apply route daily. 07/26/23   Santo Stanly LABOR, MD  Cholecalciferol (VITAMIN D3) 25 MCG (1000 UT) CAPS Take 1 capsule (1,000 Units total) by mouth daily. 03/14/24   Purcell Emil Schanz, MD  cyclobenzaprine  (FLEXERIL ) 10 MG tablet Take 1 tablet (10 mg total) by mouth at bedtime. 06/27/24   Purcell Emil Schanz, MD  dicyclomine  (BENTYL ) 20 MG tablet TAKE 1 TABLET(20 MG) BY MOUTH EVERY 6 HOURS AS NEEDED FOR SPASMS 10/19/24   Purcell Emil Schanz, MD  diltiazem  (CARDIZEM  CD) 240 MG 24 hr capsule Take 1 capsule (240 mg total) by mouth daily. 03/14/24   Peggyann Zwiefelhofer Jose, MD  ezetimibe  (ZETIA ) 10 MG tablet Take 1 tablet (10 mg total) by mouth daily. Patient not taking: Reported on 09/06/2024 03/14/24   Purcell Emil Schanz, MD  Glucagon  (GVOKE HYPOPEN  2-PACK) 0.5 MG/0.1ML SOAJ Inject 0.5 mg into the skin as needed. 03/14/24   Chaston Bradburn Jose, MD  insulin  lispro  (HUMALOG ) 100 UNIT/ML KwikPen For sugar 120 to 150 use one unit, for 151 to 200 use two units, for 201 to 250 use three units, for 251 to 300 use five units, for 301 to 350 use seven units, for 351 to 400 use 9 units, if sugar more than 400 call your primary care provider. 06/19/23   Arrien, Mauricio Daniel, MD  Insulin  Pen Needle (PEN NEEDLES) 31G X 5 MM MISC 1 each by Does not apply route 3 (three) times daily with meals. May substitute to any manufacturer covered by patient's insurance. 06/19/23   Arrien, Elidia Sieving, MD  Insulin  Syringe-Needle U-100 31G X 15/64 0.5 ML MISC Use to inject insulin  one time a day 06/19/23   Arrien, Elidia Sieving, MD  Insulin  Syringes, Disposable, U-100 1 ML MISC 2 Syringes by Does not apply route daily. Use to inject insulin  into the skin 1 time daily. diag code E11.9. Insulin  dependent 06/19/23   Arrien, Elidia Sieving, MD  LANTUS  100 UNIT/ML injection ADMINISTER 22 UNITS UNDER THE SKIN DAILY 07/15/24   Purcell Emil Schanz, MD  metoprolol succinate (TOPROL-XL) 25 MG 24 hr tablet TAKE 1 TABLET BY MOUTH EVERY DAY 09/26/24   Purcell Emil Schanz, MD  omeprazole  (PRILOSEC) 40 MG capsule TAKE 1 CAPSULE BY MOUTH EVERY DAY 03/30/23   Purcell Emil Schanz, MD  oxyCODONE -acetaminophen  (PERCOCET/ROXICET) 5-325 MG tablet Take 1 tablet  by mouth every 6 (six) hours as needed. 02/02/24   Gerome Maurilio HERO, PA-C  senna (SENOKOT) 8.6 MG tablet Take 1 tablet (8.6 mg total) by mouth as needed for constipation. 07/08/20   Jinwala, Sagar H, MD  VELPHORO 500 MG chewable tablet Chew 500 mg by mouth 3 (three) times daily.    [provider]    Allergies[1]  Patient Active Problem List   Diagnosis Date Noted   Polyp of cecum 11/21/2024   Polyp of transverse colon 11/21/2024   Polyp of sigmoid colon 11/21/2024   Colon cancer screening 11/21/2024   Hemodialysis patient 09/06/2024   Contusion of left arm 03/28/2024   Hypertensive heart and chronic kidney disease stage 5 (HCC)  06/30/2023   Chronic diastolic CHF (congestive heart failure) (HCC) 06/18/2023   Uncontrolled type 2 diabetes mellitus with hyperglycemia (HCC) 05/19/2023   CKD (chronic kidney disease) stage 5, GFR less than 15 ml/min (HCC) 03/09/2023   Stage 4 chronic kidney disease (HCC) 02/09/2022   Hyperlipidemia associated with type 2 diabetes mellitus (HCC) 04/20/2019   Vaginal atrophy 07/22/2018   Special screening for malignant neoplasms, colon 04/24/2016   GERD (gastroesophageal reflux disease) 01/16/2014   Adenomatous polyp of ascending colon 07/31/2011   Carpal tunnel syndrome, bilateral    Hypertension associated with diabetes (HCC) 11/09/2006   Controlled diabetes mellitus with retinopathy (HCC) 12/07/2002    Past Medical History:  Diagnosis Date   Carpal tunnel syndrome, bilateral    confirmed on nerve conduction studies   Chronic hepatitis C (HCC)    considered cured 06/2016 post retroviral therapy    Chronic kidney disease    dialysis M-W-F   Diabetes mellitus type 2, uncontrolled    Diabetic peripheral neuropathy (HCC)    GERD (gastroesophageal reflux disease)    History of endometriosis    History of Helicobacter infection 10/2006   Hyperlipidemia    Hypertension    Liver fibrosis 02/17/2016   F2/3    Sleep apnea 09/2018   Results showed very mild OSA - no CPAP    Past Surgical History:  Procedure Laterality Date   A/V SHUNT INTERVENTION Left 05/25/2024   Procedure: A/V SHUNT INTERVENTION;  Surgeon: Magda Debby SAILOR, MD;  Location: [facility];  Service: Cardiovascular;  Laterality: Left;   ABDOMINAL HYSTERECTOMY     AV FISTULA PLACEMENT Left 10/22/2023   Procedure: LEFT ARM BRACHIOCEPHALIC ARTERIOVENOUS (AV) FISTULA CREATION;  Surgeon: Gretta Lonni PARAS, MD;  Location: [facility];  Service: Vascular;  Laterality: Left;   CARPAL TUNNEL RELEASE Bilateral    COLONOSCOPY     EYE SURGERY Bilateral    cataracts removed   FISTULA SUPERFICIALIZATION Left 01/10/2024   Procedure:  LEFT ARM FISTULA SUPERFICIALIZATION;  Surgeon: Gretta Lonni PARAS, MD;  Location: [facility] OR;  Service: Vascular;  Laterality: Left;   LIGATION OF COMPETING BRANCHES OF ARTERIOVENOUS FISTULA Left 01/10/2024   Procedure: REVISION OF ARTERIOVENOUS FISTULA WITH SIDE BRANCH LIGATION;  Surgeon: Gretta Lonni PARAS, MD;  Location: [facility] OR;  Service: Vascular;  Laterality: Left;   REMOVAL OF A DIALYSIS CATHETER Right 02/02/2024   Procedure: DIALYSIS CATHETER EXCHANGE IN RIGHT INTERNAL JUGULAR;  Surgeon: Gretta Lonni PARAS, MD;  Location: [facility] OR;  Service: Vascular;  Laterality: Right;   UPPER GI ENDOSCOPY     VENOUS ANGIOPLASTY  05/25/2024   Procedure: VENOUS ANGIOPLASTY;  Surgeon: Magda Debby SAILOR, MD;  Location: [facility];  Service: Cardiovascular;;    Social History   Socioeconomic History   Marital status:  Single    Spouse name: Not on file   Number of children: 0   Years of education: 12   Highest education level: Not on file  Occupational History   Occupation: CNA    Employer: [employer]   Occupation: Disabled  Tobacco Use   Smoking status: Never   Smokeless tobacco: Never  Vaping Use   Vaping status: Never Used  Substance and Sexual Activity   Alcohol use: No    Alcohol/week: 0.0 standard drinks of alcohol   Drug use: No   Sexual activity: Yes    Birth control/protection: Surgical    Comment: Hysterectomy  Other Topics Concern   Not on file  Social History Narrative   Current Social History 08/28/2019        Patient lives alone in a 7th floor apartment with elevator. There are not steps up to the entrance the patient uses.       Patient's method of transportation is via family member (mom or friend).      The highest level of education was high school diploma.      The patient currently disabled.      Identified important Relationships are My mother - Patient is caregiver for Mother      Pets : None       Interests / Fun: TV, movies       Receiving HD on Monday,  Wednesday, Friday      Current Stressors: People, my health       Religious / Personal Beliefs: Baptist       L. Ducatte, RN, BSN       Social Drivers of Health   Tobacco Use: Low Risk (11/21/2024)   Patient History    Smoking Tobacco Use: Never    Smokeless Tobacco Use: Never    Passive Exposure: Not on file  Financial Resource Strain: Low Risk (04/24/2022)   Overall Financial Resource Strain (CARDIA)    Difficulty of Paying Living Expenses: Not hard at all  Food Insecurity: No Food Insecurity (08/17/2024)   Epic    Worried About Programme Researcher, Broadcasting/film/video in the Last Year: Never true    Ran Out of Food in the Last Year: Never true  Transportation Needs: No Transportation Needs (08/17/2024)   Epic    Lack of Transportation (Medical): No    Lack of Transportation (Non-Medical): No  Physical Activity: Inactive (03/23/2024)   Exercise Vital Sign    Days of Exercise per Week: 0 days    Minutes of Exercise per Session: 0 min  Stress: Stress Concern Present (03/23/2024)   [employer] of Occupational Health - Occupational Stress Questionnaire    Feeling of Stress : To some extent  Social Connections: Moderately Isolated (03/23/2024)   Social Connection and Isolation Panel    Frequency of Communication with Friends and Family: More than three times a week    Frequency of Social Gatherings with Friends and Family: Never    Attends Religious Services: Never    Database Administrator or Organizations: No    Attends Banker Meetings: Never    Marital Status: Living with partner  Intimate Partner Violence: Not At Risk (08/17/2024)   Epic    Fear of Current or Ex-Partner: No    Emotionally Abused: No    Physically Abused: No    Sexually Abused: No  Depression (PHQ2-9): Medium Risk (08/17/2024)   Depression (PHQ2-9)    PHQ-2 Score: 8  Alcohol Screen: Low Risk (03/23/2024)  Alcohol Screen    Last Alcohol Screening Score (AUDIT): 0  Housing: Low Risk (08/17/2024)   Epic     Unable to Pay for Housing in the Last Year: No    Number of Times Moved in the Last Year: 0    Homeless in the Last Year: No  Utilities: Not At Risk (08/17/2024)   Epic    Threatened with loss of utilities: No  Health Literacy: Not on file    Family History  Problem Relation Age of Onset   Cancer Mother        lung   Kidney disease Mother    Lung cancer Maternal Uncle    Breast cancer Maternal Grandmother    Liver cancer Maternal Grandfather    Heart disease Brother    Depression Brother    Diabetes Father    Dementia Father    Colon cancer Neg Hx      Review of Systems  Constitutional: Negative.  Negative for chills and fever.  HENT: Negative.  Negative for congestion and sore throat.   Respiratory: Negative.  Negative for cough and shortness of breath.   Cardiovascular: Negative.  Negative for chest pain and palpitations.  Gastrointestinal:  Negative for abdominal pain, diarrhea, nausea and vomiting.  Genitourinary: Negative.  Negative for dysuria and hematuria.  Skin: Negative.  Negative for rash.  Neurological: Negative.  Negative for dizziness and headaches.  All other systems reviewed and are negative.   Vitals:   11/23/24 1341  BP: 138/80  Pulse: (!) 41  Temp: 98.5 F (36.9 C)  SpO2: 99%    Physical Exam Vitals reviewed.  Constitutional:      Appearance: Normal appearance.  HENT:     Head: Normocephalic.     Mouth/Throat:     Mouth: Mucous membranes are moist.     Pharynx: Oropharynx is clear.  Eyes:     Extraocular Movements: Extraocular movements intact.     Conjunctiva/sclera: Conjunctivae normal.     Pupils: Pupils are equal, round, and reactive to light.  Cardiovascular:     Rate and Rhythm: Normal rate and regular rhythm.     Pulses: Normal pulses.     Heart sounds: Normal heart sounds.  Pulmonary:     Effort: Pulmonary effort is normal.     Breath sounds: Normal breath sounds.  Musculoskeletal:     Cervical back: No tenderness.   Lymphadenopathy:     Cervical: No cervical adenopathy.  Skin:    General: Skin is warm and dry.     Capillary Refill: Capillary refill takes less than 2 seconds.  Neurological:     General: No focal deficit present.     Mental Status: She is alert and oriented to person, place, and time.  Psychiatric:        Mood and Affect: Mood normal.        Behavior: Behavior normal.    Results for orders placed or performed in visit on 11/23/24 (from the past 24 hours)  POCT HgB A1C     Status: Abnormal   Collection Time: 11/23/24  2:06 PM  Result Value Ref Range   Hemoglobin A1C     HbA1c POC (<> result, manual entry) 8.1 4.0 - 5.6 %   HbA1c, POC (prediabetic range)     HbA1c, POC (controlled diabetic range)     *Note: Due to a large number of results and/or encounters for the requested time period, some results have not been displayed. A complete set of  results can be found in Results Review.     ASSESSMENT & PLAN: A total of 42 minutes was spent with the patient and counseling/coordination of care regarding preparing for this visit, review of most recent office visit notes, review of multiple chronic medical conditions and their management, review of all medications and changes made, review of most recent bloodwork results including interpretation of today's hemoglobin A1c, review of health maintenance items, education on nutrition, prognosis, documentation, and need for follow up.   Problem List Items Addressed This Visit       Cardiovascular and Mediastinum   Hypertension associated with diabetes (HCC) - Primary   Well-controlled hypertension Continues diltiazem  240 mg daily Hemoglobin A1c today at 8.1, better than before Increase daily insulin  glargine to 25 units Has not been using insulin  lispro Premeal  as per sliding scale. Not taking GLP-1 agonist.  Does not want to take it because of weight loss Cardiovascular risks associated with hypertension and diabetes discussed Diet  and nutrition discussed      Relevant Medications   insulin  glargine (LANTUS ) 100 UNIT/ML injection   Hypertensive heart and chronic kidney disease stage 5 (HCC)   Stable blood pressure with normal readings at home Continue diltiazem  360 mg daily Tolerating dialysis well. Feels much improved. Clinically normovolemic. No concerns today.        Digestive   GERD (gastroesophageal reflux disease) (Chronic)   Relevant Medications   omeprazole  (PRILOSEC) 40 MG capsule   Adenomatous polyp of ascending colon   Status post colonoscopy 2 days ago. Precancerous polyps removed Doing well        Endocrine   Hyperlipidemia associated with type 2 diabetes mellitus (HCC)   Chronic stable conditions Continue atorvastatin  40 mg and Zetia  10 mg daily      Relevant Medications   insulin  glargine (LANTUS ) 100 UNIT/ML injection   Other Visit Diagnoses       Type 2 diabetes mellitus with hyperlipidemia (HCC)       Relevant Medications   insulin  glargine (LANTUS ) 100 UNIT/ML injection   Other Relevant Orders   POCT HgB A1C (Completed)      Patient Instructions  Take Lantus  insulin  25 units daily Continue checking glucose in the morning and in the afternoon Contact the office if numbers persistently abnormal  Health Maintenance After Age 65 After age 36, you are at a higher risk for certain long-term diseases and infections as well as injuries from falls. Falls are a major cause of broken bones and head injuries in people who are older than age 66. Getting regular preventive care can help to keep you healthy and well. Preventive care includes getting regular testing and making lifestyle changes as recommended by your health care provider. Talk with your health care provider about: Which screenings and tests you should have. A screening is a test that checks for a disease when you have no symptoms. A diet and exercise plan that is right for you. What should I know about screenings and  tests to prevent falls? Screening and testing are the best ways to find a health problem early. Early diagnosis and treatment give you the best chance of managing medical conditions that are common after age 20. Certain conditions and lifestyle choices may make you more likely to have a fall. Your health care provider may recommend: Regular vision checks. Poor vision and conditions such as cataracts can make you more likely to have a fall. If you wear glasses, make sure to get your  prescription updated if your vision changes. Medicine review. Work with your health care provider to regularly review all of the medicines you are taking, including over-the-counter medicines. Ask your health care provider about any side effects that may make you more likely to have a fall. Tell your health care provider if any medicines that you take make you feel dizzy or sleepy. Strength and balance checks. Your health care provider may recommend certain tests to check your strength and balance while standing, walking, or changing positions. Foot health exam. Foot pain and numbness, as well as not wearing proper footwear, can make you more likely to have a fall. Screenings, including: Osteoporosis screening. Osteoporosis is a condition that causes the bones to get weaker and break more easily. Blood pressure screening. Blood pressure changes and medicines to control blood pressure can make you feel dizzy. Depression screening. You may be more likely to have a fall if you have a fear of falling, feel depressed, or feel unable to do activities that you used to do. Alcohol use screening. Using too much alcohol can affect your balance and may make you more likely to have a fall. Follow these instructions at home: Lifestyle Do not drink alcohol if: Your health care provider tells you not to drink. If you drink alcohol: Limit how much you have to: 0-1 drink a day for women. 0-2 drinks a day for men. Know how much alcohol  is in your drink. In the [place] one drink equals one 12 oz bottle of beer (355 mL), one 5 oz glass of wine (148 mL), or one 1 oz glass of hard liquor (44 mL). Do not use any products that contain nicotine or tobacco. These products include cigarettes, chewing tobacco, and vaping devices, such as e-cigarettes. If you need help quitting, ask your health care provider. Activity  Follow a regular exercise program to stay fit. This will help you maintain your balance. Ask your health care provider what types of exercise are appropriate for you. If you need a cane or walker, use it as recommended by your health care provider. Wear supportive shoes that have nonskid soles. Safety  Remove any tripping hazards, such as rugs, cords, and clutter. Install safety equipment such as grab bars in bathrooms and safety rails on stairs. Keep rooms and walkways well-lit. General instructions Talk with your health care provider about your risks for falling. Tell your health care provider if: You fall. Be sure to tell your health care provider about all falls, even ones that seem minor. You feel dizzy, tiredness (fatigue), or off-balance. Take over-the-counter and prescription medicines only as told by your health care provider. These include supplements. Eat a healthy diet and maintain a healthy weight. A healthy diet includes low-fat dairy products, low-fat (lean) meats, and fiber from whole grains, beans, and lots of fruits and vegetables. Stay current with your vaccines. Schedule regular health, dental, and eye exams. Summary Having a healthy lifestyle and getting preventive care can help to protect your health and wellness after age 46. Screening and testing are the best way to find a health problem early and help you avoid having a fall. Early diagnosis and treatment give you the best chance for managing medical conditions that are more common for people who are older than age 24. Falls are a major cause of  broken bones and head injuries in people who are older than age 11. Take precautions to prevent a fall at home. Work with your health care provider to  learn what changes you can make to improve your health and wellness and to prevent falls. This information is not intended to replace advice given to you by your health care provider. Make sure you discuss any questions you have with your health care provider. Document Revised: 04/14/2021 Document Reviewed: 04/14/2021 Elsevier Patient Education  2024 Elsevier Inc.     Emil Schaumann, MD [facility]    [1]  Allergies Allergen Reactions   Ace Inhibitors Other (See Comments) and Cough    Persistent dry cough   Amitriptyline Hcl Nausea And Vomiting and Other (See Comments)    GI Intolerance/Upset stomach   Aspirin  Other (See Comments)    GI Intolerance   Latex Itching   Prednisone  Nausea And Vomiting

## 2024-11-23 NOTE — Assessment & Plan Note (Signed)
 Well-controlled hypertension Continues diltiazem  240 mg daily Hemoglobin A1c today at 8.1, better than before Increase daily insulin  glargine to 25 units Has not been using insulin  lispro Premeal  as per sliding scale. Not taking GLP-1 agonist.  Does not want to take it because of weight loss Cardiovascular risks associated with hypertension and diabetes discussed Diet and nutrition discussed

## 2024-11-23 NOTE — Assessment & Plan Note (Signed)
 Status post colonoscopy 2 days ago. Precancerous polyps removed Doing well

## 2024-11-23 NOTE — Telephone Encounter (Signed)
 Copied from CRM #[id number]. Topic: Clinical - Prescription Issue >> Nov 23, 2024  1:46 PM Nessti S wrote: Reason for CRM: cyclobenzaprine  (FLEXERIL ) 10 MG tablet can increase the risk for confusion and fall leave message for care team to reevaluate for deprescribing and reducing or switching to a safer alt >> Nov 23, 2024  1:56 PM Nessti S wrote: dicyclomine  (BENTYL ) 20 MG tablet can increase the risk for confusion and fall leave message for care team to reevaluate for deprescribing and reducing or switching to a safer alt

## 2024-12-05 ENCOUNTER — Other Ambulatory Visit

## 2024-12-06 ENCOUNTER — Encounter: Payer: Self-pay | Admitting: *Deleted

## 2024-12-06 ENCOUNTER — Telehealth: Payer: Self-pay | Admitting: *Deleted

## 2024-12-06 NOTE — Patient Outreach (Signed)
 Complex Care Management   Visit Note  12/06/2024  Name:  Sissy Goetzke MRN: [id number] DOB: 11-02-1956  Situation: RNCM spoke briefly with pt today for the scheduled appointment. Pt has requested a call back on Monday after 10:00 AM.   RNCM will rescheduled today's appointment to another appointed date and time.  Follow Up Plan:   Telephone follow up appointment date/time:  12/26/2024 @ 1100 AM for Heddy Shutter, RNCM  Olam Ku, RN, BSN [place]  [facility] RN Care Manager Direct Dial : [phone]  Fax: [phone]

## 2024-12-12 ENCOUNTER — Telehealth: Payer: Self-pay

## 2024-12-12 ENCOUNTER — Inpatient Hospital Stay: Admission: RE | Admit: 2024-12-12 | Discharge: 2024-12-12 | Attending: Emergency Medicine

## 2024-12-12 DIAGNOSIS — Z1231 Encounter for screening mammogram for malignant neoplasm of breast: Secondary | ICD-10-CM

## 2024-12-26 ENCOUNTER — Telehealth: Payer: Self-pay

## 2024-12-27 ENCOUNTER — Other Ambulatory Visit: Payer: Self-pay

## 2024-12-27 NOTE — Patient Instructions (Signed)
 Visit Information  Thank you for taking time to visit with me today. Please don't hesitate to contact me if I can be of assistance to you before our next scheduled appointment.  Your next care management appointment is by telephone on 01/24/25 at 11:00am  Telephone follow-up in 1 month  Please call the care guide team at [phone] if you need to cancel, schedule, or reschedule an appointment.   Please call the Suicide and Crisis Lifeline: 988 call the [place]  National Suicide Prevention Lifeline: 435 365 9801 or TTY: 306 268 7799 TTY 432 612 5643) to talk to a trained counselor call 1-800-273-TALK (toll free, 24 hour hotline) if you are experiencing a Mental Health or Behavioral Health Crisis or need someone to talk to.  Ngan Qualls RN RN Care Manager [facility] [phone]

## 2024-12-27 NOTE — Patient Outreach (Signed)
 Complex Care Management   Visit Note  12/27/2024  Name:  Nancy [name] MRN: [id number] DOB: [birth year]  Situation: Referral received for Complex Care Management related to Diabetes with Complications and CKD on Dialysis I obtained verbal consent from Patient.  Visit completed with Patient  on the phone  Background:   Past Medical History:  Diagnosis Date   Carpal tunnel syndrome, bilateral    confirmed on nerve conduction studies   Chronic hepatitis C (HCC)    considered cured 06/2016 post retroviral therapy    Chronic kidney disease    dialysis M-W-F   Diabetes mellitus type 2, uncontrolled    Diabetic peripheral neuropathy (HCC)    GERD (gastroesophageal reflux disease)    History of endometriosis    History of Helicobacter infection 10/2006   Hyperlipidemia    Hypertension    Liver fibrosis 02/17/2016   F2/3    Sleep apnea 09/2018   Results showed very mild OSA - no CPAP    Assessment: Patient Reported Symptoms:  Cognitive Cognitive Status: No symptoms reported      Neurological Neurological Review of Symptoms: No symptoms reported Neurological Management Strategies: Adequate rest  HEENT HEENT Symptoms Reported: No symptoms reported HEENT Management Strategies: Routine screening HEENT Self-Management Outcome: 4 (good)    Cardiovascular Cardiovascular Symptoms Reported: No symptoms reported Does patient have uncontrolled Hypertension?: Yes Is patient checking Blood Pressure at home?: No Patient's Recent BP reading at home: not checking at home Cardiovascular Management Strategies: Routine screening Cardiovascular Self-Management Outcome: 4 (good)  Respiratory Respiratory Symptoms Reported: No symptoms reported Respiratory Management Strategies: Routine screening Respiratory Self-Management Outcome: 4 (good)  Endocrine Endocrine Symptoms Reported: Hypoglycemia, Hyperglycemia Is patient diabetic?: Yes Is patient checking blood sugars at home?: Yes List most  recent blood sugar readings, include date and time of day: 1/20 184 fasting Endocrine Self-Management Outcome: 3 (uncertain)  Gastrointestinal Additional Gastrointestinal Details: bowel movement 1/20 Gastrointestinal Management Strategies: Adequate rest Gastrointestinal Self-Management Outcome: 4 (good)    Genitourinary Genitourinary Symptoms Reported: No symptoms reported Additional Genitourinary Details: hd doesn not urinate Genitourinary Management Strategies: Hemodialysis Hemodialysis Schedule: mon wed friday at [facility] Last Treatment: 12/27/24 Genitourinary Self-Management Outcome: 4 (good)  Integumentary Integumentary Symptoms Reported: No symptoms reported Additional Integumentary Details: bruises from dialysis Skin Management Strategies: Routine screening Skin Self-Management Outcome: 4 (good)  Musculoskeletal Musculoskelatal Symptoms Reviewed: No symptoms reported Additional Musculoskeletal Details: walks short distances Musculoskeletal Management Strategies: Routine screening Musculoskeletal Self-Management Outcome: 4 (good) Falls in the past year?: No Number of falls in past year: 1 or less Was there an injury with Fall?: No Fall Risk Category Calculator: 0 Patient Fall Risk Level: Low Fall Risk Fall risk Follow up: Education provided, Falls prevention discussed  Psychosocial Psychosocial Symptoms Reported: No symptoms reported Behavioral Health Self-Management Outcome: 4 (good) Major Change/Loss/Stressor/Fears (CP): Denies Techniques to Cope with Loss/Stress/Change: Spiritual practice(s) Quality of Family Relationships: involved, supportive Do you feel physically threatened by others?: No    12/27/2024    PHQ2-9 Depression Screening   Little interest or pleasure in doing things Several days  Feeling down, depressed, or hopeless Several days  PHQ-2 - Total Score 2  Trouble falling or staying asleep, or sleeping too much Several days  Feeling tired or  having little energy Several days  Poor appetite or overeating     Feeling bad about yourself - or that you are a failure or have let yourself or your family down Several days  Trouble concentrating on things, such as reading the  newspaper or watching television Several days  Moving or speaking so slowly that other people could have noticed.  Or the opposite - being so fidgety or restless that you have been moving around a lot more than usual Not at all  Thoughts that you would be better off dead, or hurting yourself in some way Not at all  PHQ2-9 Total Score 6  If you checked off any problems, how difficult have these problems made it for you to do your work, take care of things at home, or get along with other people    Depression Interventions/Treatment Currently on Treatment    There were no vitals filed for this visit. Pain Scale: 0-10 Pain Score: 0-No pain  Medications Reviewed Today     Reviewed by Rio Kidane , RN (Registered Nurse) on 12/27/24 at 1054  Med List Status: <None>   Medication Order Taking? Sig Documenting Provider Last Dose Status Informant  Accu-Chek FastClix Lancets MISC [id number] No Use to check blood sugar up to 3 times a day Purcell Emil Schanz, MD 11/20/2024 Active   ACCU-CHEK GUIDE test strip [id number] No CHECK BLOOD SUGAR 3 TIMES A DAY Purcell Emil Schanz, MD 11/20/2024 Active Self  atorvastatin  (LIPITOR) 40 MG tablet [id number] No Take 1 tablet (40 mg total) by mouth daily. [name], Miguel Jose, MD 11/20/2024 Active   Blood Glucose Monitoring Suppl (ACCU-CHEK GUIDE) w/Device KIT [id number] No 1 each by Does not apply route 3 (three) times daily. Check blood sugar 3 times a day Noralee Elidia Sieving, MD 11/20/2024 Active Self  Blood Pressure Monitoring (ADVANCED ONE STEP BP MONITOR) MISC [id number] No 1 Units by Does not apply route daily. Santo Stanly LABOR, MD 11/20/2024 Active Self  Cholecalciferol (VITAMIN D3) 25 MCG (1000 UT) CAPS [id number] No  Take 1 capsule (1,000 Units total) by mouth daily. [name], Miguel Jose, MD 11/20/2024 Active   cyclobenzaprine  (FLEXERIL ) 10 MG tablet [id number] No Take 1 tablet (10 mg total) by mouth at bedtime. [name], Miguel Jose, MD Past Week Active   dicyclomine  (BENTYL ) 20 MG tablet [id number] No TAKE 1 TABLET(20 MG) BY MOUTH EVERY 6 HOURS AS NEEDED FOR SPASMS Purcell Emil Schanz, MD 11/20/2024 Active   diltiazem  (CARDIZEM  CD) 240 MG 24 hr capsule [id number] No Take 1 capsule (240 mg total) by mouth daily. [name], Miguel Jose, MD 11/20/2024 Active   ezetimibe  (ZETIA ) 10 MG tablet [id number]  Take 1 tablet (10 mg total) by mouth daily.  Patient not taking: Reported on 09/06/2024   [name], Miguel Jose, MD  Active   Glucagon  (GVOKE HYPOPEN  2-PACK) 0.5 MG/0.1ML EMMANUEL [id number] No Inject 0.5 mg into the skin as needed. [name], Miguel Jose, MD 11/20/2024 Active   insulin  glargine (LANTUS ) 100 UNIT/ML injection [id number]  Inject 0.25 mLs (25 Units total) into the skin daily. [name], Miguel Jose, MD  Active   insulin  lispro (HUMALOG ) 100 UNIT/ML KwikPen [id number] No For sugar 120 to 150 use one unit, for 151 to 200 use two units, for 201 to 250 use three units, for 251 to 300 use five units, for 301 to 350 use seven units, for 351 to 400 use 9 units, if sugar more than 400 call your primary care provider. Arrien, Elidia Sieving, MD 11/20/2024 Active Self  Insulin  Pen Needle (PEN NEEDLES) 31G X 5 MM MISC [id number]  1 each by Does not apply route 3 (three) times daily with meals. May substitute to any manufacturer covered by patient's insurance. Arrien, Elidia Sieving, MD  Active Self  Insulin  Syringe-Needle U-100  31G X 15/64 0.5 ML MISC [id number] No Use to inject insulin  one time a day Arrien, Elidia Sieving, MD 11/20/2024 Active Self  Insulin  Syringes, Disposable, U-100 1 ML MISC [id number] No 2 Syringes by Does not apply route daily. Use to inject insulin  into the skin 1 time daily. diag code E11.9. Insulin   dependent Arrien, Elidia Sieving, MD 11/20/2024 Active Self  metoprolol succinate (TOPROL-XL) 25 MG 24 hr tablet [id number] No TAKE 1 TABLET BY MOUTH EVERY DAY [name], Miguel Jose, MD 11/20/2024 Active   omeprazole  (PRILOSEC) 40 MG capsule [id number]  Take 1 capsule (40 mg total) by mouth daily. [name], Miguel Jose, MD  Active   oxyCODONE -acetaminophen  (PERCOCET/ROXICET) 5-325 MG tablet [id number] No Take 1 tablet by mouth every 6 (six) hours as needed. Gerome Herring M, PA-C More than a month Active   senna (SENOKOT) 8.6 MG tablet [id number] No Take 1 tablet (8.6 mg total) by mouth as needed for constipation. Jinwala, Sagar H, MD 11/20/2024 Active Self  VELPHORO 500 MG chewable tablet [id number] No Chew 500 mg by mouth 3 (three) times daily. [provider] 11/20/2024 Active             Recommendation:   Continue Current Plan of Care  Follow Up Plan:   Telephone follow-up in 1 month  Tensley Wery RN RN Care Manager [facility] [phone]

## 2024-12-28 ENCOUNTER — Other Ambulatory Visit (HOSPITAL_BASED_OUTPATIENT_CLINIC_OR_DEPARTMENT_OTHER)

## 2024-12-29 ENCOUNTER — Other Ambulatory Visit: Payer: Self-pay

## 2024-12-29 DIAGNOSIS — N186 End stage renal disease: Secondary | ICD-10-CM

## 2025-01-04 ENCOUNTER — Ambulatory Visit: Payer: Self-pay | Admitting: Gastroenterology

## 2025-01-23 ENCOUNTER — Ambulatory Visit (HOSPITAL_COMMUNITY)

## 2025-01-23 ENCOUNTER — Ambulatory Visit: Admitting: Vascular Surgery

## 2025-01-24 ENCOUNTER — Telehealth

## 2025-02-01 ENCOUNTER — Other Ambulatory Visit

## 2025-03-27 ENCOUNTER — Ambulatory Visit

## 2025-03-29 ENCOUNTER — Ambulatory Visit
